# Patient Record
Sex: Male | Born: 1940 | Race: Black or African American | Hispanic: No | Marital: Married | Smoking: Former smoker
Health system: Southern US, Community
[De-identification: ages and names within clinical notes are randomized; demographics above are authoritative.]

## PROBLEM LIST (undated history)

## (undated) DIAGNOSIS — I639 Cerebral infarction, unspecified: Secondary | ICD-10-CM

## (undated) DIAGNOSIS — Z992 Dependence on renal dialysis: Secondary | ICD-10-CM

## (undated) DIAGNOSIS — I10 Essential (primary) hypertension: Secondary | ICD-10-CM

## (undated) DIAGNOSIS — E0590 Thyrotoxicosis, unspecified without thyrotoxic crisis or storm: Secondary | ICD-10-CM

## (undated) DIAGNOSIS — N186 End stage renal disease: Secondary | ICD-10-CM

## (undated) DIAGNOSIS — T82868A Thrombosis of vascular prosthetic devices, implants and grafts, initial encounter: Secondary | ICD-10-CM

## (undated) DIAGNOSIS — F329 Major depressive disorder, single episode, unspecified: Secondary | ICD-10-CM

## (undated) DIAGNOSIS — M1990 Unspecified osteoarthritis, unspecified site: Secondary | ICD-10-CM

## (undated) DIAGNOSIS — T81328A Disruption or dehiscence of closure of other specified internal operation (surgical) wound, initial encounter: Secondary | ICD-10-CM

## (undated) DIAGNOSIS — E7800 Pure hypercholesterolemia, unspecified: Secondary | ICD-10-CM

## (undated) DIAGNOSIS — I96 Gangrene, not elsewhere classified: Secondary | ICD-10-CM

## (undated) DIAGNOSIS — I2510 Atherosclerotic heart disease of native coronary artery without angina pectoris: Secondary | ICD-10-CM

## (undated) DIAGNOSIS — K259 Gastric ulcer, unspecified as acute or chronic, without hemorrhage or perforation: Secondary | ICD-10-CM

## (undated) DIAGNOSIS — T8132XA Disruption of internal operation (surgical) wound, not elsewhere classified, initial encounter: Secondary | ICD-10-CM

## (undated) DIAGNOSIS — F32A Depression, unspecified: Secondary | ICD-10-CM

## (undated) DIAGNOSIS — M51369 Other intervertebral disc degeneration, lumbar region without mention of lumbar back pain or lower extremity pain: Secondary | ICD-10-CM

## (undated) DIAGNOSIS — M5136 Other intervertebral disc degeneration, lumbar region: Secondary | ICD-10-CM

## (undated) DIAGNOSIS — M5030 Other cervical disc degeneration, unspecified cervical region: Secondary | ICD-10-CM

## (undated) DIAGNOSIS — J45909 Unspecified asthma, uncomplicated: Secondary | ICD-10-CM

## (undated) HISTORY — PX: CHOLECYSTECTOMY: SHX55

## (undated) HISTORY — PX: BELOW KNEE LEG AMPUTATION: SUR23

## (undated) HISTORY — PX: APPENDECTOMY: SHX54

## (undated) HISTORY — DX: Gangrene, not elsewhere classified: I96

---

## 1994-02-18 HISTORY — PX: CORONARY ARTERY BYPASS GRAFT: SHX141

## 1997-06-23 ENCOUNTER — Other Ambulatory Visit: Admission: RE | Admit: 1997-06-23 | Discharge: 1997-06-23 | Payer: Self-pay | Admitting: Internal Medicine

## 1998-02-07 ENCOUNTER — Ambulatory Visit (HOSPITAL_COMMUNITY): Admission: RE | Admit: 1998-02-07 | Discharge: 1998-02-07 | Payer: Self-pay | Admitting: Internal Medicine

## 1998-02-07 ENCOUNTER — Encounter: Payer: Self-pay | Admitting: Internal Medicine

## 1998-02-18 DIAGNOSIS — I2510 Atherosclerotic heart disease of native coronary artery without angina pectoris: Secondary | ICD-10-CM

## 1998-02-18 HISTORY — DX: Atherosclerotic heart disease of native coronary artery without angina pectoris: I25.10

## 1998-06-13 ENCOUNTER — Encounter: Payer: Self-pay | Admitting: Cardiology

## 1998-06-13 ENCOUNTER — Inpatient Hospital Stay (HOSPITAL_COMMUNITY): Admission: EM | Admit: 1998-06-13 | Discharge: 1998-06-15 | Payer: Self-pay | Admitting: Emergency Medicine

## 1999-03-08 ENCOUNTER — Encounter: Payer: Self-pay | Admitting: Internal Medicine

## 1999-03-08 ENCOUNTER — Ambulatory Visit (HOSPITAL_COMMUNITY): Admission: RE | Admit: 1999-03-08 | Discharge: 1999-03-08 | Payer: Self-pay | Admitting: Internal Medicine

## 1999-11-19 ENCOUNTER — Inpatient Hospital Stay (HOSPITAL_COMMUNITY): Admission: EM | Admit: 1999-11-19 | Discharge: 1999-11-22 | Payer: Self-pay | Admitting: Emergency Medicine

## 1999-11-19 ENCOUNTER — Encounter: Payer: Self-pay | Admitting: Emergency Medicine

## 1999-11-20 ENCOUNTER — Encounter: Payer: Self-pay | Admitting: Emergency Medicine

## 1999-12-11 ENCOUNTER — Inpatient Hospital Stay (HOSPITAL_COMMUNITY): Admission: EM | Admit: 1999-12-11 | Discharge: 1999-12-12 | Payer: Self-pay | Admitting: Emergency Medicine

## 1999-12-11 ENCOUNTER — Encounter: Payer: Self-pay | Admitting: *Deleted

## 2001-01-09 ENCOUNTER — Encounter: Payer: Self-pay | Admitting: Cardiology

## 2001-01-09 ENCOUNTER — Ambulatory Visit (HOSPITAL_COMMUNITY): Admission: RE | Admit: 2001-01-09 | Discharge: 2001-01-10 | Payer: Self-pay | Admitting: Rehabilitation

## 2002-10-21 ENCOUNTER — Encounter: Payer: Self-pay | Admitting: Orthopedic Surgery

## 2002-10-21 ENCOUNTER — Inpatient Hospital Stay (HOSPITAL_COMMUNITY): Admission: AD | Admit: 2002-10-21 | Discharge: 2002-11-01 | Payer: Self-pay | Admitting: Orthopedic Surgery

## 2002-10-22 ENCOUNTER — Encounter: Payer: Self-pay | Admitting: Orthopedic Surgery

## 2002-11-23 ENCOUNTER — Inpatient Hospital Stay (HOSPITAL_COMMUNITY): Admission: RE | Admit: 2002-11-23 | Discharge: 2002-11-26 | Payer: Self-pay | Admitting: Orthopedic Surgery

## 2002-12-29 ENCOUNTER — Inpatient Hospital Stay (HOSPITAL_COMMUNITY): Admission: RE | Admit: 2002-12-29 | Discharge: 2002-12-31 | Payer: Self-pay | Admitting: Orthopedic Surgery

## 2004-08-31 ENCOUNTER — Ambulatory Visit (HOSPITAL_COMMUNITY): Admission: RE | Admit: 2004-08-31 | Discharge: 2004-08-31 | Payer: Self-pay | Admitting: Internal Medicine

## 2005-03-26 ENCOUNTER — Ambulatory Visit (HOSPITAL_COMMUNITY): Admission: RE | Admit: 2005-03-26 | Discharge: 2005-03-26 | Payer: Self-pay | Admitting: Internal Medicine

## 2005-07-01 ENCOUNTER — Ambulatory Visit (HOSPITAL_COMMUNITY): Admission: RE | Admit: 2005-07-01 | Discharge: 2005-07-01 | Payer: Self-pay | Admitting: Internal Medicine

## 2005-07-03 ENCOUNTER — Ambulatory Visit: Admission: RE | Admit: 2005-07-03 | Discharge: 2005-07-03 | Payer: Self-pay | Admitting: Internal Medicine

## 2010-07-20 ENCOUNTER — Encounter (HOSPITAL_COMMUNITY): Payer: Self-pay | Admitting: Radiology

## 2010-07-20 ENCOUNTER — Emergency Department (HOSPITAL_COMMUNITY): Payer: Medicare Other

## 2010-07-20 ENCOUNTER — Observation Stay (HOSPITAL_COMMUNITY)
Admission: EM | Admit: 2010-07-20 | Discharge: 2010-07-21 | DRG: 552 | Disposition: A | Discharge status: Home / Self Care | Payer: Medicare Other | Attending: Internal Medicine | Admitting: Internal Medicine

## 2010-07-20 DIAGNOSIS — M47812 Spondylosis without myelopathy or radiculopathy, cervical region: Principal | ICD-10-CM | POA: Diagnosis present

## 2010-07-20 DIAGNOSIS — N289 Disorder of kidney and ureter, unspecified: Secondary | ICD-10-CM | POA: Diagnosis present

## 2010-07-20 DIAGNOSIS — I129 Hypertensive chronic kidney disease with stage 1 through stage 4 chronic kidney disease, or unspecified chronic kidney disease: Secondary | ICD-10-CM | POA: Diagnosis present

## 2010-07-20 DIAGNOSIS — M6740 Ganglion, unspecified site: Secondary | ICD-10-CM | POA: Diagnosis present

## 2010-07-20 DIAGNOSIS — R0789 Other chest pain: Secondary | ICD-10-CM | POA: Diagnosis present

## 2010-07-20 DIAGNOSIS — R911 Solitary pulmonary nodule: Secondary | ICD-10-CM | POA: Diagnosis present

## 2010-07-20 DIAGNOSIS — D649 Anemia, unspecified: Secondary | ICD-10-CM | POA: Diagnosis present

## 2010-07-20 DIAGNOSIS — S88119A Complete traumatic amputation at level between knee and ankle, unspecified lower leg, initial encounter: Secondary | ICD-10-CM

## 2010-07-20 DIAGNOSIS — I2510 Atherosclerotic heart disease of native coronary artery without angina pectoris: Secondary | ICD-10-CM | POA: Diagnosis present

## 2010-07-20 DIAGNOSIS — E119 Type 2 diabetes mellitus without complications: Secondary | ICD-10-CM | POA: Diagnosis present

## 2010-07-20 DIAGNOSIS — Z79899 Other long term (current) drug therapy: Secondary | ICD-10-CM | POA: Insufficient documentation

## 2010-07-20 DIAGNOSIS — N183 Chronic kidney disease, stage 3 unspecified: Secondary | ICD-10-CM | POA: Diagnosis present

## 2010-07-20 HISTORY — DX: Essential (primary) hypertension: I10

## 2010-07-20 LAB — CBC
Platelets: 254 10*3/uL (ref 150–400)
RDW: 13.8 % (ref 11.5–15.5)
WBC: 9.5 10*3/uL (ref 4.0–10.5)

## 2010-07-20 LAB — DIFFERENTIAL
Eosinophils Absolute: 0.4 10*3/uL (ref 0.0–0.7)
Lymphocytes Relative: 31 % (ref 12–46)
Lymphs Abs: 3 10*3/uL (ref 0.7–4.0)
Monocytes Absolute: 0.6 10*3/uL (ref 0.1–1.0)
Monocytes Relative: 6 % (ref 3–12)
Neutro Abs: 5.6 10*3/uL (ref 1.7–7.7)

## 2010-07-20 LAB — COMPREHENSIVE METABOLIC PANEL
Albumin: 2.6 g/dL — ABNORMAL LOW (ref 3.5–5.2)
Alkaline Phosphatase: 101 U/L (ref 39–117)
Chloride: 103 mEq/L (ref 96–112)
Glucose, Bld: 95 mg/dL (ref 70–99)
Potassium: 4.7 mEq/L (ref 3.5–5.1)
Total Bilirubin: 0.3 mg/dL (ref 0.3–1.2)
Total Protein: 7.3 g/dL (ref 6.0–8.3)

## 2010-07-20 LAB — CK TOTAL AND CKMB (NOT AT ARMC)
CK, MB: 3.7 ng/mL (ref 0.3–4.0)
Total CK: 122 U/L (ref 7–232)

## 2010-07-20 LAB — D-DIMER, QUANTITATIVE: D-Dimer, Quant: 2.07 ug/mL-FEU — ABNORMAL HIGH (ref 0.00–0.48)

## 2010-07-20 MED ORDER — IOHEXOL 350 MG/ML SOLN
64.0000 mL | Freq: Once | INTRAVENOUS | Status: AC | PRN
  Administered 2010-07-20: 64 mL via INTRAVENOUS

## 2010-07-21 ENCOUNTER — Inpatient Hospital Stay (HOSPITAL_COMMUNITY): Payer: Medicare Other

## 2010-07-21 LAB — GLUCOSE, CAPILLARY
Glucose-Capillary: 117 mg/dL — ABNORMAL HIGH (ref 70–99)
Glucose-Capillary: 215 mg/dL — ABNORMAL HIGH (ref 70–99)

## 2010-07-21 LAB — URIC ACID: Uric Acid, Serum: 7.3 mg/dL (ref 4.0–7.8)

## 2010-07-21 LAB — BASIC METABOLIC PANEL
Chloride: 107 mEq/L (ref 96–112)
GFR calc non Af Amer: 39 mL/min — ABNORMAL LOW (ref >60)
Potassium: 4.6 mEq/L (ref 3.5–5.1)

## 2010-07-21 LAB — SEDIMENTATION RATE: Sed Rate: 100 mm/hr — ABNORMAL HIGH (ref 0–16)

## 2010-07-21 LAB — CARDIAC PANEL(CRET KIN+CKTOT+MB+TROPI)
CK, MB: 3.4 ng/mL (ref 0.3–4.0)
Relative Index: 3.5 — ABNORMAL HIGH (ref 0.0–2.5)
Total CK: 102 U/L (ref 7–232)
Total CK: 93 U/L (ref 7–232)
Troponin I: <0.3 ng/mL (ref <0.30)
Troponin I: <0.3 ng/mL (ref <0.30)

## 2010-07-21 LAB — HEMOGLOBIN A1C: Hgb A1c MFr Bld: 7.7 % — ABNORMAL HIGH (ref <5.7)

## 2010-07-22 NOTE — Discharge Summary (Signed)
NAME:  Alan [name], LINSCHEID NO.:  [id number]  MEDICAL RECORD NO.:  [id number]           PATIENT TYPE:  I  LOCATION:  [id number]                          FACILITY:  [facility]  PHYSICIAN:  Rexene Alberts, M.D.    DATE OF BIRTH:  1940/06/08  DATE OF ADMISSION:  07/20/2010 DATE OF DISCHARGE:  06/02/2012LH                              DISCHARGE SUMMARY   DISCHARGE DIAGNOSES: 1. Chest pain, musculoskeletal in origin.  Myocardial infarction ruled     out.  CT angiogram of the chest was negative for pulmonary     embolism. 2. A 1 cm inferior right upper lobe pulmonary nodule per CT angiogram     of the chest on July 20, 2010.  Followup PET/CT recommended for     further assessment in the outpatient setting. 3. 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  There was also mild     to moderate spinal stenosis at C4-C5 and C5-C6.  There was also a     benign-appearing sclerosis of the right C2 vertebral body. 4. Normocytic anemia; needs further outpatient evaluation. 5. Acute renal insufficiency with a history of probable stage III     chronic kidney disease.  The patient's creatinine was 1.92 on     admission and 1.74 at the time of hospital discharge.  He did     receive IV contrast for the CT angiogram of the chest on July 20, 2010. 6. Left upper extremity edema, likely secondary to ganglion cyst. 7. History of coronary artery disease, stable. 8. Type 2 diabetes mellitus, stable. 9. Hypertension, stable.  DISCHARGE MEDICATIONS: 1. Omeprazole 20 mg daily (new medication). 2. Prednisone with 10 mg tablets.  The patient was instructed to taper     the tablets as prescribed over the next 6 days. 3. Tylenol Arthritis Strength 650 mg 1 tablet every 6 hours as needed     for pain and fever. 4. Aspirin 81 mg daily. 5. Enalapril 2.5 mg nightly to be restarted on Monday June 4. 6. Glyburide 10 mg b.i.d.. 7. Humalog  sliding scale as previously directed before each meal and     at bedtime. 8. Lasix 40 mg half a tablet b.i.d. to be restarted on Monday June 4. 9. Pravastatin 80 mg nightly. 10.Albuterol inhaler 1 puff b.i.d. as needed for wheezing. 11.Stop ranitidine 12.Stop naproxen.  DISCHARGE DISPOSITION:  The patient was discharged to home in improved and stable condition on June 72, 2012.  He Kaydn follow up with his primary care physician Dr. Bridget Hartshorn or colleague in 3 days as previously scheduled.  CONSULTATIONS:  None.  PROCEDURES PERFORMED: 1. X-ray of the cervical spine on June 72, 2012.  The results revealed     severe spondylosis, but no acute findings. 2. CT scan of the cervical spine without contrast on June 72, 2012.     The results revealed very advanced cervical disk degeneration of C4-     C5 through C7-T1.  Multifactorial mild to moderate spinal stenosis  at C4-C5 and C5-C6.  Multifactorial moderate neural foraminal     stenosis at the bilateral C5 and C6 nerve levels.  Benign-appearing     sclerosis of the right C2 vertebral body.  Benign/degenerative     subchondral cyst effects the lower cervical vertebral bodies.  No     acute fracture or listhesis identified in the cervical spine. 3. CT angiogram of the chest on July 20, 2010.  The results revealed no     gross acute aortic abnormality.  No pulmonary embolus. 4. 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  1 cm inferior     right upper lobe pulmonary nodule.  Followup PET/CT recommended to     assess for metabolic activity.  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  Cholecystectomy.  HISTORY OF PRESENT ILLNESS:  The patient is a 70 year old man with a past medical history significant for coronary artery disease, status post CABG with subsequent stenting, diabetes mellitus, and hypertension. He presented to the emergency department on July 20, 2010, with a chief complaint of central chest pain.  In the emergency  department, the patient's EKG revealed normal sinus rhythm with a heart rate of 72 beats per minute and with no significant ST or T-wave abnormalities.  He was hemodynamically stable and afebrile.  His chest x-ray revealed no focal consolidation; apical lordotic projection probably accounts for the opacity of the left lung base in conjunction with chronic scarring and atelectasis.  His first set of cardiac enzymes were within normal limits.  His D-dimer was elevated at 2.07.  This prompted a CT angiogram of his chest.  The CT angiogram was negative for pulmonary embolus.  He was admitted for further evaluation and management.  HOSPITAL COURSE:  The patient was started on nitroglycerin paste.  He was given multiple doses of sublingual nitroglycerin at home and in route by EMS.  It did relieve his pain some, but it did not relieve his pain completely.  It caused a headache.  Cardiac enzymes were ordered for evaluation.  All of his cardiac enzymes were well within normal limits and therefore he ruled out for myocardial infarction.  The nitroglycerin paste was subsequently discontinued.  Of note, the patient was continued on pravastatin.  Ranitidine was discontinued in favor of Protonix for a possible gastroesophageal reflux component.  The patient did have a history of heartburn for which he had been taking ranitidine with modest relief.  His BUN was 31 and his creatinine was 1.92 on admission.  Enalapril and Lasix were withheld temporarily.  He was started on IV fluid hydration with normal saline.  Apparently, the patient received IV contrast for the CT angiogram even though his creatinine was 1.92.  He received at least 2-1/2 L of IV fluids during the hospital course.  His followup BUN improved to 25 and his followup creatinine improved to 1.74.  He acknowledged a history of chronic kidney disease for which he sees a nephrologist in [place], [place]. His baseline creatinine was unknown  but it is likely that he has stage III chronic kidney disease.  For treatment of his diabetes, sliding scale NovoLog was started.  Glyburide was withheld during the hospitalization.  For the most part, his capillary blood glucose ranged from 117 to 215.  He did have 1 asymptomatic episode of hypoglycemia shortly after admission with a venous glucose of 49.  He was treated appropriately.  In my exam of the patient, it was clear that he had bilateral  shoulder discomfort, bilateral upper extremity decrease in range of motion due to discomfort and stiffness, and generalized achiness in his neck.  Each time he raised his right arm, it caused some chest pain.  When he flexed and extended his neck modestly, it also caused some central chest pain. There was some mild tenderness of his pectoralis muscles on the left and the right.  This prompted a CT of his cervical spine.  The results of the cervical spine were dictated above.  It was apparent that he has very advanced cervical disk disease with evidence of mild to moderate spinal stenosis at C4-C5 and C5-C6.  These findings were likely the cause of his chest pain.  This was explained to the patient and his family.  He was given 60 mg of prednisone prior to discharge.  He was discharged on a prednisone taper with instructions.  He was advised to discuss these findings with his primary care physician.  The patient would likely benefit from an outpatient referral to an orthopedic surgeon or neurosurgeon.  He was also informed of the lung nodule that was seen on the CT scan of his chest.  He was instructed to discuss this finding with his primary care physician who can order a PET scan in the near future for followup.  During the followup examination, several hours after he received the prednisone, the patient did express some decrease in the stiffness and pain of his shoulders and neck.  A sed rate was ordered and it was elevated at 100.  His  uric acid was within normal limits at 7.3.  The result of the C-reactive protein was pending at the time of discharge.     Rexene Alberts, M.D.     DF/MEDQ  D:  07/21/2010  T:  07/22/2010  Job:  [id number]  cc:   [facility]  Electronically Signed by Rexene Alberts M.D. on 07/22/2010 06:19:04 PM

## 2010-07-22 NOTE — H&P (Signed)
NAME:  Alan [name], Alan [name] NO.:  [id number]  MEDICAL RECORD NO.:  [id number]           PATIENT TYPE:  I  LOCATION:  [id number]                          FACILITY:  [facility]  PHYSICIAN:  Karlyn Agee, M.D. DATE OF BIRTH:  1941-02-03  DATE OF ADMISSION:  07/20/2010 DATE OF DISCHARGE:  LH                             HISTORY & PHYSICAL   PRIMARY CARE PHYSICIAN:  Unassigned.  CARDIOLOGIST:  [facility], saw Dr. Melvern Banker about 1 year ago.  CHIEF COMPLAINT:  Chest pain since this morning.  HISTORY OF PRESENT ILLNESS:  This is a 70 year old African American gentleman with a history of coronary artery disease status post CABG with subsequent stenting, also history of diabetes and hypertension, status post lower extremity amputation for ischemic disease, who lives at home with his wife and developed a sudden onset of central chest pain at about 8 o'clock the morning of admission.  The pain got progressively worse, at its height, it was about a 10/10.  Took 5 sublingual nitroglycerin which she had at home, but got no relief and eventually, EMS was called.  Ambulance arrived about 12 noon.  He received sublingual nitroglycerin x3 from the EMS and reports that he did get some relief of the pain, but that brought on some headache.  He arrived to the emergency room and had initial cardiac workup done.  No acute evidence of myocardial infarction was found and the Hospitalist Service was called to assist with management.  The patient denied any shortness of breath, dizziness, or diaphoresis- associated pain, but said he did have a feeling of having chills.  The pain is aggravated by deep breathing and movement, black stool, but he also notes that he has been having chills.  PAST MEDICAL HISTORY: 1. Coronary artery disease status post CABG at [facility] in 1996. 2. Status post cardiac stenting 4 years later. 3. Hypertension. 4. Diabetes. 5. History of stroke with  left-sided weakness, now completely     resolved. 6. History of left BKA in 2004, for a nonhealing ulcer of the left     foot. 7. Chronic kidney disease, seeing a doctor in [place]. 8. History of asthma.  Past surgical history includes CABG and left BKA as noted above, also appendectomy after a ruptured appendix.  He is status post cholecystectomy.  MEDICATIONS: 1. Glyburide 10 mg twice daily. 2. Humalog by sliding scale. 3. Lasix 40 mg twice daily. 4. Enalapril 2.5 mg daily. 5. Zantac 300 mg daily. 6. Pravastatin 80 mg at bedtime. 7. Naproxen 500 mg twice daily. 8. An inhaler for wheezing twice daily. 9. Aspirin 81 mg a day.  ALLERGIES:  CODEINE causes itching.  SOCIAL HISTORY:  Denies tobacco, alcohol, or illicit drug use.  Used to work in [employer] fund[employer] in [employer], now long retired.  FAMILY HISTORY:  Significant only for a sister with diabetes. Otherwise, he denies any history of family medical problems.  REVIEW OF SYSTEMS:  Significant for urge incontinence.  He has an artificial limb and ambulates with the assistance of a walker.  Other than this, a complete review of systems is unremarkable.  PHYSICAL EXAMINATION:  GENERAL:  Pleasant, elderly, obese African American gentleman, sitting up in the stretcher, does not appear acutely distressed. VITAL SIGNS:  Temperature is 98, pulse 80, respirations 20, blood pressure 128/88, he is saturating 100% on 2 L. HEENT:  Pupils are round and equal.  Mucous membranes pink.  Anicteric. NECK:  No cervical lymphadenopathy or thyromegaly.  No carotid bruit. CHEST:  He has a broad scar over the sternum.  He is tender to palpation over the sternum.  He has gynecomastia of the left breast.  His chest is clear to auscultation bilaterally. CARDIOVASCULAR SYSTEM:  Regular rhythm.  No murmur heard. ABDOMEN:  Obese, soft, nontender. EXTREMITIES:  He has a left BKA with a well-healed scar.  He has no edema.  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  His right hand has significant wasting of the small muscles of the hand, in particular the muscles between the right thumb and index finger are completely wasted, he still reports, secondary to local trauma. CENTRAL NERVOUS SYSTEM:  Cranial nerves II through XII are grossly intact.  He has no focal lateralizing signs.  LABORATORY DATA:  His white count is 9.5, hemoglobin 10.9, platelets 254.  His sodium is 138, potassium 4.7, chloride 103, CO2 of 29, glucose 95, BUN 31, creatinine 1.92.  His albumin is 2.6, calcium 9.8.  His D- dimer is elevated at 2.0.  His total CK is 122, CK-MB 3.7.  His troponin is undetectable.  Despite his elevated creatinine, a CT angiogram of the chest was done which showed no evidence of pulmonary embolus.  CABG was noted with remote findings of sternal dehiscence, a 1-cm right upper lobe pulmonary nodule was noted, neoplasm is not excluded, old granulomatous disease of the liver.  ASSESSMENT: 1. Atypical chest pain, likely musculoskeletal, but reports some     improvements with nitroglycerin. 2. Coronary artery disease. 3. Chronic kidney disease, baseline unknown. 4. Hypertension. 5. Diabetes type 2. 6. Remote history of peptic ulcer disease.  PLAN: 1. We Bram bring this gentleman on observation. 2. We Audrey hydrate him cautiously to see if we can get an improvement     in his renal function.  Also, we are concerned that he has received     contrast. 3. We Kushal discontinue his naproxen in view of his history of chronic     kidney disease. 4. We Siraj give him Nitropaste to the anterior chest wall since he     does report improvement with Nitropaste, but we Ulys not give any     other active treatment for coronary artery disease. 5. We Purcell monitor him on telemetry. 6. We Jefte discontinue his glipizide, Lasix, and enalapril for the     time being while we hydrate him.  We Dornell give high-dose Protonix      in view of his past history of peptic ulcer disease. 7. Other plans as per orders.     Karlyn Agee, M.D.     LC/MEDQ  D:  07/21/2010  T:  07/21/2010  Job:  [id number]  cc:   [facility]  Electronically Signed by Karlyn Agee M.D. on 07/22/2010 05:38:53 AM

## 2011-01-15 ENCOUNTER — Emergency Department (HOSPITAL_COMMUNITY): Payer: Medicare Other

## 2011-01-15 ENCOUNTER — Emergency Department (HOSPITAL_COMMUNITY)
Admission: EM | Admit: 2011-01-15 | Discharge: 2011-01-15 | Disposition: A | Discharge status: Home / Self Care | Payer: Medicare Other | Attending: Emergency Medicine | Admitting: Emergency Medicine

## 2011-01-15 ENCOUNTER — Encounter (HOSPITAL_COMMUNITY): Payer: Self-pay | Admitting: *Deleted

## 2011-01-15 DIAGNOSIS — E119 Type 2 diabetes mellitus without complications: Secondary | ICD-10-CM | POA: Insufficient documentation

## 2011-01-15 DIAGNOSIS — M109 Gout, unspecified: Secondary | ICD-10-CM | POA: Insufficient documentation

## 2011-01-15 DIAGNOSIS — Z951 Presence of aortocoronary bypass graft: Secondary | ICD-10-CM | POA: Insufficient documentation

## 2011-01-15 DIAGNOSIS — S88119A Complete traumatic amputation at level between knee and ankle, unspecified lower leg, initial encounter: Secondary | ICD-10-CM | POA: Insufficient documentation

## 2011-01-15 DIAGNOSIS — I10 Essential (primary) hypertension: Secondary | ICD-10-CM | POA: Insufficient documentation

## 2011-01-15 LAB — GLUCOSE, CAPILLARY: Glucose-Capillary: 153 mg/dL — ABNORMAL HIGH (ref 70–99)

## 2011-01-15 MED ORDER — COLCHICINE 0.6 MG PO TABS: ORAL_TABLET | ORAL | Status: DC

## 2011-01-15 NOTE — ED Notes (Signed)
Pt states intermittent swelling, redness, and pain to left hand.  Sent from [facility]. Also has knot to back of neck and left wrist area. Pt states knots have began to get larger over past year.

## 2011-01-15 NOTE — ED Provider Notes (Signed)
History     CSN: [id number] Arrival date & time: 01/15/2011  8:17 AM   First MD Initiated Contact with Patient 01/15/11 0818      Chief Complaint  Patient presents with  . Hand Pain   HPI Patient has been having trouble with pain in his hand for the last 6 months. He's been diagnosed with gout in the past and has been given prescriptions for steroids which have caused the swelling to resolve. Patient also has been having cysts in the back of his neck and his wrist. Those have been gradually getting larger over the past year. Patient denies any fevers or chills. He has not had any recent injuries. The swelling is left hand is primarily in his first and second finger and also in the palm of his hand. Patient states he has seen Dr. Luna Glasgow in town and then was referred to an orthopedic doctor in [place]. Family states he was told it was gout. Patient was sent from [facility] to the emergency room today to evaluate this further. The pain increases with movement and palpation. It is mild to moderate in nature. Past Medical History  Diagnosis Date  . Diabetes mellitus   . Hypertension   . Gout     Past Surgical History  Procedure Date  . Coronary artery bypass graft   . Below knee leg amputation     No family history on file.  History  Substance Use Topics  . Smoking status: Never Smoker   . Smokeless tobacco: Not on file  . Alcohol Use: No      Review of Systems  All other systems reviewed and are negative.    Allergies  Codeine  Home Medications  No current outpatient prescriptions on file.  BP 123/69  Pulse 70  Temp(Src) 97.8 F (36.6 C) (Oral)  Resp 16  SpO2 100%  Physical Exam  Nursing note and vitals reviewed. Constitutional: He appears well-developed and well-nourished. No distress.  HENT:  Head: Normocephalic and atraumatic.  Right Ear: External ear normal.  Left Ear: External ear normal.  Eyes: Conjunctivae are normal. Right eye  exhibits no discharge. Left eye exhibits no discharge. No scleral icterus.  Neck: Neck supple. No tracheal deviation present.  Cardiovascular: Normal rate, regular rhythm and intact distal pulses.   Pulmonary/Chest: Effort normal and breath sounds normal. No stridor. No respiratory distress. He has no wheezes. He has no rales.  Abdominal: He exhibits no distension.  Musculoskeletal:       Benign mobile rubbery cyst in the cervical spine region posteriorly, no tenderness to palpation, large cystic rubbery lesion left molar region or wrist, left hand with edema of the first and second digit, mild erythema, no warmth, no significant tenderness, range of motion of second digit limited with some flexion contracture, distal cap refill intact  Neurological: He is alert. He has normal strength. No sensory deficit. Cranial nerve deficit:  no gross defecits noted. He exhibits normal muscle tone. He displays no seizure activity. Coordination normal.  Skin: Skin is warm and dry. No rash noted.  Psychiatric: He has a normal mood and affect.    ED Course  Procedures (including critical care time)  Labs Reviewed  GLUCOSE, CAPILLARY - Abnormal; Notable for the following:    Glucose-Capillary 153 (*)    All other components within normal limits  POCT CBG MONITORING   Dg Hand Complete Left  01/15/2011  *RADIOLOGY REPORT*  Clinical Data: Pain, swelling.  LEFT HAND - COMPLETE  3+ VIEW  Comparison: None.  Findings: Advanced degenerative joint disease changes in the first carpal metacarpal joint with subluxation.  Remainder of joint spaces are maintained. No acute bony abnormality.  Specifically, no fracture, subluxation, or dislocation.  Soft tissues are intact.  IMPRESSION: No acute bony abnormality.  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  Original Report Authenticated By: Raelyn Number, M.D.      MDM  The patient has advanced degenerative changes in his first metacarpal  phalangeal joint. Previously he has been diagnosed with gout. His physical exam suggest a rather extensive involvement. Patient has been on steroids recently without success. I Keontre try him on a course of colchicine. I recommended he followup with his primary care doctor or consider seeing a rheumatologist for further evaluation. Regarding the cysts lesions they appear benign and they can be electively removed as needed. His exam does not suggest any signs of acute infection of the hand toward joint space especially considering the 6 month duration of this illness.        Kathalene Frames, MD 01/15/11 [phone]

## 2011-10-28 ENCOUNTER — Emergency Department (HOSPITAL_COMMUNITY): Payer: Medicare Other

## 2011-10-28 ENCOUNTER — Emergency Department (HOSPITAL_COMMUNITY)
Admission: EM | Admit: 2011-10-28 | Discharge: 2011-10-28 | Disposition: A | Discharge status: Home / Self Care | Payer: Medicare Other | Attending: Emergency Medicine | Admitting: Emergency Medicine

## 2011-10-28 ENCOUNTER — Encounter (HOSPITAL_COMMUNITY): Payer: Self-pay | Admitting: Emergency Medicine

## 2011-10-28 DIAGNOSIS — Z79899 Other long term (current) drug therapy: Secondary | ICD-10-CM | POA: Insufficient documentation

## 2011-10-28 DIAGNOSIS — S88119A Complete traumatic amputation at level between knee and ankle, unspecified lower leg, initial encounter: Secondary | ICD-10-CM | POA: Insufficient documentation

## 2011-10-28 DIAGNOSIS — Z951 Presence of aortocoronary bypass graft: Secondary | ICD-10-CM | POA: Insufficient documentation

## 2011-10-28 DIAGNOSIS — R109 Unspecified abdominal pain: Secondary | ICD-10-CM | POA: Insufficient documentation

## 2011-10-28 DIAGNOSIS — M549 Dorsalgia, unspecified: Secondary | ICD-10-CM | POA: Insufficient documentation

## 2011-10-28 DIAGNOSIS — I10 Essential (primary) hypertension: Secondary | ICD-10-CM | POA: Insufficient documentation

## 2011-10-28 DIAGNOSIS — E119 Type 2 diabetes mellitus without complications: Secondary | ICD-10-CM | POA: Insufficient documentation

## 2011-10-28 LAB — CBC WITH DIFFERENTIAL/PLATELET
Basophils Absolute: 0 10*3/uL (ref 0.0–0.1)
Basophils Relative: 0 % (ref 0–1)
MCHC: 34 g/dL (ref 30.0–36.0)
Neutro Abs: 8.6 10*3/uL — ABNORMAL HIGH (ref 1.7–7.7)
Neutrophils Relative %: 71 % (ref 43–77)
RDW: 13.5 % (ref 11.5–15.5)

## 2011-10-28 LAB — COMPREHENSIVE METABOLIC PANEL
AST: 18 U/L (ref 0–37)
Albumin: 3.1 g/dL — ABNORMAL LOW (ref 3.5–5.2)
Alkaline Phosphatase: 83 U/L (ref 39–117)
Chloride: 97 mEq/L (ref 96–112)
Creatinine, Ser: 1.48 mg/dL — ABNORMAL HIGH (ref 0.50–1.35)
Potassium: 3.8 mEq/L (ref 3.5–5.1)
Total Bilirubin: 0.5 mg/dL (ref 0.3–1.2)

## 2011-10-28 LAB — URINALYSIS, ROUTINE W REFLEX MICROSCOPIC
Leukocytes, UA: NEGATIVE
Nitrite: NEGATIVE
Protein, ur: NEGATIVE mg/dL

## 2011-10-28 MED ORDER — TRAMADOL HCL 50 MG PO TABS: 50.0000 mg | ORAL_TABLET | Freq: Four times a day (QID) | ORAL | Status: DC | PRN

## 2011-10-28 MED ORDER — SODIUM CHLORIDE 0.9 % IV BOLUS (SEPSIS)
1000.0000 mL | Freq: Once | INTRAVENOUS | Status: AC
  Administered 2011-10-28: 1000 mL via INTRAVENOUS

## 2011-10-28 MED ORDER — ONDANSETRON HCL 4 MG/2ML IJ SOLN
4.0000 mg | Freq: Once | INTRAMUSCULAR | Status: AC
  Administered 2011-10-28: 4 mg via INTRAVENOUS
  Filled 2011-10-28: qty 2

## 2011-10-28 MED ORDER — HYDROMORPHONE HCL PF 1 MG/ML IJ SOLN
1.0000 mg | Freq: Once | INTRAMUSCULAR | Status: AC
  Administered 2011-10-28: 1 mg via INTRAVENOUS
  Filled 2011-10-28: qty 1

## 2011-10-28 NOTE — ED Notes (Signed)
Patient with no complaints at this time. Respirations even and unlabored. Skin warm/dry. Discharge instructions reviewed with patient at this time. Patient given opportunity to voice concerns/ask questions. IV removed per policy and band-aid applied to site. Patient discharged at this time and left Emergency Department with steady gait.  

## 2011-10-28 NOTE — ED Notes (Signed)
Right sided flank pain starting 3 days ago.  Urgency and burning with urination.

## 2011-10-28 NOTE — ED Provider Notes (Signed)
History   This chart was scribed for Alieyah Spader B. Karle Starch, MD by Hampton Abbot. This patient was seen in room APA05/APA05 and the patient's care was started at 2:44PM.   CSN: [id number]  Arrival date & time 10/28/11  1033   First MD Initiated Contact with Patient 10/28/11 1439      Chief Complaint  Patient presents with  . Flank Pain    Right    (Consider location/radiation/quality/duration/timing/severity/associated sxs/prior treatment) Patient is a 71 y.o. male presenting with flank pain. The history is provided by the patient. No language interpreter was used.  Flank Pain  Pt reports 3 days of moderate to severe aching R flank pain, non radiating, worse with some movements, not improved with home pain medications. He reports 3 episodes of non-bloody, non-bilious emesis over past two days but denies any episodes today.  Pt denies rash and hematuria as associated symptoms.  Pt has h/o HTN and DM.  Pt denies tobacco and alcohol use. No falls. No abdominal pain.   Past Medical History  Diagnosis Date  . Diabetes mellitus   . Hypertension   . Gout     Past Surgical History  Procedure Date  . Coronary artery bypass graft   . Below knee leg amputation     History reviewed. No pertinent family history.  History  Substance Use Topics  . Smoking status: Never Smoker   . Smokeless tobacco: Not on file  . Alcohol Use: No      Review of Systems  Genitourinary: Positive for flank pain.  All other systems reviewed and are negative.    Allergies  Codeine  Home Medications   Current Outpatient Rx  Name Route Sig Dispense Refill  . ACETAMINOPHEN ER 650 MG PO TBCR Oral Take 650 mg by mouth 3 (three) times daily.      . COLCHICINE 0.6 MG PO TABS  Take 2 tablets at once and then one additional tablet one hour later if needed. You may repeat in 24 hours 12 tablet 0  . ENALAPRIL MALEATE 2.5 MG PO TABS Oral Take 2.5 mg by mouth daily.      . FUROSEMIDE 40 MG PO TABS Oral Take 20 mg  by mouth 2 (two) times daily.      Marland Kitchen GLIPIZIDE 10 MG PO TABS Oral Take 10 mg by mouth 2 (two) times daily.      . INSULIN ISOPHANE HUMAN 100 UNIT/ML [place] SUSP Subcutaneous Inject 3-30 Units into the skin 2 (two) times daily as needed. Use according to sliding scale.     Marland Kitchen METOPROLOL SUCCINATE ER 25 MG PO TB24 Oral Take 25 mg by mouth.      Creed Copper M PLUS PO TABS Oral Take 1 tablet by mouth daily.      Marland Kitchen PRAVASTATIN SODIUM 80 MG PO TABS Oral Take 80 mg by mouth daily.        BP 125/72  Pulse 79  Temp 98.2 F (36.8 C) (Oral)  Resp 16  Ht 5\' 9"  (1.753 m)  SpO2 100%  Physical Exam  Nursing note and vitals reviewed. Constitutional: He is oriented to person, place, and time. He appears well-developed and well-nourished.  HENT:  Head: Normocephalic and atraumatic.  Eyes: EOM are normal. Pupils are equal, round, and reactive to light.  Neck: Normal range of motion. Neck supple.  Cardiovascular: Normal rate, normal heart sounds and intact distal pulses.   Pulmonary/Chest: Effort normal and breath sounds normal.  Abdominal: Bowel sounds are normal. He  exhibits no distension. There is no tenderness.  Musculoskeletal: Normal range of motion. He exhibits tenderness. He exhibits no edema.       Some tenderness to palpation and light touch on R flank  Neurological: He is alert and oriented to person, place, and time. He has normal strength. No cranial nerve deficit or sensory deficit.  Skin: Skin is warm and dry. No rash (specifically no shingles rash) noted.  Psychiatric: He has a normal mood and affect.    ED Course  Procedures (including critical care time) DIAGNOSTIC STUDIES: Oxygen Saturation is 100% on room air, normal by my interpretation.    COORDINATION OF CARE: 2:45PM- Ordered IV fluids, nausea meds, and pain meds.     Labs Reviewed  URINALYSIS, ROUTINE W REFLEX MICROSCOPIC - Abnormal; Notable for the following:    Ketones, ur TRACE (*)     All other components within normal  limits  CBC WITH DIFFERENTIAL - Abnormal; Notable for the following:    WBC 12.2 (*)     Neutro Abs 8.6 (*)     All other components within normal limits  COMPREHENSIVE METABOLIC PANEL - Abnormal; Notable for the following:    Glucose, Bld 139 (*)     Creatinine, Ser 1.48 (*)     Albumin 3.1 (*)     GFR calc non Af Amer 46 (*)     GFR calc Af Amer 53 (*)     All other components within normal limits   Ct Abdomen Pelvis Wo Contrast  10/28/2011  *RADIOLOGY REPORT*  Clinical Data: Right flank pain for 2-3 days, vomiting  CT ABDOMEN AND PELVIS WITHOUT CONTRAST  Technique:  Multidetector CT imaging of the abdomen and pelvis was performed following the standard protocol without intravenous contrast.  Comparison: None.  Findings: Probable linear atelectasis is noted at the left lung base.  The liver is unremarkable in the unenhanced state.  Surgical clips are present from prior cholecystectomy.  There does appear to be an upper midline abdominal hernia containing only fat.  There is diffuse fatty infiltration of the pancreas.  The pancreatic duct is not dilated.  The adrenal glands and spleen are unremarkable.  The stomach is moderately fluid distended with no abnormality noted. No renal calculi are seen.  There is no evidence of hydronephrosis. The abdominal aorta is ectatic but normal in caliber.  No adenopathy is seen.  The urinary bladder is moderately urine distended with no abnormality noted.  No distal ureteral calculus is seen.  There is moderate enlargement of the prostate gland which measures 4.3 x 5.0 cm in its largest axial plane, slightly indenting the posterior inferior urinary bladder.  The colon is largely decompressed but no colonic abnormality is seen.  The terminal ileum is unremarkable. Moderate thoracolumbar scoliosis is noted with diffuse degenerative change and degenerative disc disease throughout the lumbar spine. 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  IMPRESSION:  1.  No acute abnormality on unenhanced CT of the abdomen and pelvis. 2.  Small upper midline abdominal hernia containing fat. 3.  No renal calculi.  No hydronephrosis.  None of for enlarged prostate. 5.  Moderate thoracolumbar scoliosis with diffuse degenerative change.  Degenerative change also involves the right hip with deformity of the right femoral head.   Original Report Authenticated By: Joretta Bachelor, M.D.      No diagnosis found.    MDM  No significant abnormalities in labs  or imaging.   Pain possibly due to back muscle spasm given history of hip arthritis and R leg prosthesis. Consider early shingles as well. Regino d/c with pain medications and PCP follow-up.     I personally performed the services described in the documentation, which were scribed in my presence. The recorded information has been reviewed and considered.     Rossana Molchan B. Karle Starch, MD 10/28/11 1649

## 2011-11-04 ENCOUNTER — Emergency Department (HOSPITAL_COMMUNITY)
Admission: EM | Admit: 2011-11-04 | Discharge: 2011-11-04 | Disposition: A | Discharge status: Home / Self Care | Payer: Medicare Other | Attending: Emergency Medicine | Admitting: Emergency Medicine

## 2011-11-04 ENCOUNTER — Encounter (HOSPITAL_COMMUNITY): Payer: Self-pay | Admitting: *Deleted

## 2011-11-04 DIAGNOSIS — N289 Disorder of kidney and ureter, unspecified: Secondary | ICD-10-CM

## 2011-11-04 DIAGNOSIS — M545 Low back pain, unspecified: Secondary | ICD-10-CM | POA: Insufficient documentation

## 2011-11-04 DIAGNOSIS — I959 Hypotension, unspecified: Secondary | ICD-10-CM

## 2011-11-04 DIAGNOSIS — Z951 Presence of aortocoronary bypass graft: Secondary | ICD-10-CM | POA: Insufficient documentation

## 2011-11-04 DIAGNOSIS — I10 Essential (primary) hypertension: Secondary | ICD-10-CM | POA: Insufficient documentation

## 2011-11-04 DIAGNOSIS — E119 Type 2 diabetes mellitus without complications: Secondary | ICD-10-CM | POA: Insufficient documentation

## 2011-11-04 DIAGNOSIS — M109 Gout, unspecified: Secondary | ICD-10-CM | POA: Insufficient documentation

## 2011-11-04 DIAGNOSIS — Z79899 Other long term (current) drug therapy: Secondary | ICD-10-CM | POA: Insufficient documentation

## 2011-11-04 DIAGNOSIS — S88119A Complete traumatic amputation at level between knee and ankle, unspecified lower leg, initial encounter: Secondary | ICD-10-CM | POA: Insufficient documentation

## 2011-11-04 DIAGNOSIS — M549 Dorsalgia, unspecified: Secondary | ICD-10-CM

## 2011-11-04 DIAGNOSIS — Z794 Long term (current) use of insulin: Secondary | ICD-10-CM | POA: Insufficient documentation

## 2011-11-04 LAB — CBC WITH DIFFERENTIAL/PLATELET
Eosinophils Absolute: 0.1 10*3/uL (ref 0.0–0.7)
Eosinophils Relative: 1 % (ref 0–5)
HCT: 34.8 % — ABNORMAL LOW (ref 39.0–52.0)
Lymphs Abs: 2.7 10*3/uL (ref 0.7–4.0)
MCH: 29 pg (ref 26.0–34.0)
MCV: 86.1 fL (ref 78.0–100.0)
Monocytes Absolute: 1 10*3/uL (ref 0.1–1.0)
Monocytes Relative: 9 % (ref 3–12)
Platelets: 223 10*3/uL (ref 150–400)
RBC: 4.04 MIL/uL — ABNORMAL LOW (ref 4.22–5.81)

## 2011-11-04 LAB — BASIC METABOLIC PANEL
BUN: 31 mg/dL — ABNORMAL HIGH (ref 6–23)
Calcium: 9.4 mg/dL (ref 8.4–10.5)
Creatinine, Ser: 1.91 mg/dL — ABNORMAL HIGH (ref 0.50–1.35)
GFR calc non Af Amer: 33 mL/min — ABNORMAL LOW (ref >90)
Glucose, Bld: 166 mg/dL — ABNORMAL HIGH (ref 70–99)
Sodium: 138 mEq/L (ref 135–145)

## 2011-11-04 LAB — URINALYSIS, ROUTINE W REFLEX MICROSCOPIC
Bilirubin Urine: NEGATIVE
Glucose, UA: 250 mg/dL — AB
Hgb urine dipstick: NEGATIVE
Ketones, ur: NEGATIVE mg/dL
Protein, ur: NEGATIVE mg/dL
Urobilinogen, UA: 0.2 mg/dL (ref 0.0–1.0)

## 2011-11-04 MED ORDER — SODIUM CHLORIDE 0.9 % IV BOLUS (SEPSIS)
500.0000 mL | Freq: Once | INTRAVENOUS | Status: AC
  Administered 2011-11-04: 1000 mL via INTRAVENOUS

## 2011-11-04 MED ORDER — DIAZEPAM 5 MG PO TABS: 5.0000 mg | ORAL_TABLET | Freq: Four times a day (QID) | ORAL | Status: DC | PRN

## 2011-11-04 MED ORDER — OXYCODONE-ACETAMINOPHEN 5-325 MG PO TABS: 1.0000 | ORAL_TABLET | Freq: Four times a day (QID) | ORAL | Status: DC | PRN

## 2011-11-04 NOTE — ED Provider Notes (Signed)
History    This chart was scribed for [employer]. Alvino Chapel, MD, MD by Rhae Lerner. The patient was seen in room APA08 and the patient's care was started at 5:13PM.   CSN: [id number]  Arrival date & time 11/04/11  1615      Chief Complaint  Patient presents with  . Back Pain    (Consider location/radiation/quality/duration/timing/severity/associated sxs/prior treatment) Patient is a 71 y.o. male presenting with back pain. The history is provided by the patient. No language interpreter was used.  Back Pain  This is a recurrent problem. Pertinent negatives include no fever and no weakness.   Alan [name] is a 71 y.o. male who presents to the Emergency Department complaining of moderate right lower back with gradual onset 10 days ago. Pt reports symptoms have gradually worsened. Reports that lying on his left side improves symptoms. Pt reports that his amputated leg has mild pain onset 1 day ago. Pt has been to PCP for current symptoms and given pain medication but he has not had relief after taking medication.   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 Diabetes mellitus   . Hypertension   . Gout     Past Surgical History  Procedure Date  . Coronary artery bypass graft   . Below knee leg amputation     History reviewed. No pertinent family history.  History  Substance Use Topics  . Smoking status: Never Smoker   . Smokeless tobacco: Not on file  . Alcohol Use: No      Review of Systems  Constitutional: Negative for fever and chills.  Respiratory: Negative for shortness of breath.   Gastrointestinal: Positive for nausea and vomiting.  Musculoskeletal: Positive for back pain.  Neurological: Negative for weakness.    Allergies  Codeine and Yellow jacket venom  Home Medications   Current Outpatient Rx  Name Route Sig Dispense Refill  . VITAMIN C PO Oral Take 1 tablet by mouth daily.    . ASPIRIN EC 81 MG PO TBEC Oral Take 81  mg by mouth every morning.    . CO Q 10 PO Oral Take 1 tablet by mouth daily.    . ENALAPRIL MALEATE 5 MG PO TABS Oral Take 5 mg by mouth daily.    . FUROSEMIDE 40 MG PO TABS Oral Take 20 mg by mouth 2 (two) times daily.      Marland Kitchen GLIPIZIDE 10 MG PO TABS Oral Take 10 mg by mouth 2 (two) times daily.      . INSULIN ISOPHANE HUMAN 100 UNIT/ML [place] SUSP Subcutaneous Inject 10 Units into the skin at bedtime. Use according to sliding scale.    Creed Copper M PLUS PO TABS Oral Take 1 tablet by mouth daily.      Marland Kitchen NITROGLYCERIN 0.4 MG SL SUBL Sublingual Place 0.4 mg under the tongue every 5 (five) minutes as needed.    Marland Kitchen PRAVASTATIN SODIUM 80 MG PO TABS Oral Take 80 mg by mouth every evening.     Marland Kitchen TRAMADOL HCL 50 MG PO TABS Oral Take 1 tablet (50 mg total) by mouth every 6 (six) hours as needed for pain. 15 tablet 0    BP 91/52  Pulse 63  Temp 97.8 F (36.6 C) (Oral)  Resp 18  Ht 5\' 9"  (1.753 m)  Wt 190 lb (86.183 kg)  BMI 28.06 kg/m2  SpO2 100%  Physical Exam  Nursing note and vitals reviewed. Constitutional: He is  oriented to person, place, and time. He appears well-developed and well-nourished.  HENT:  Head: Normocephalic and atraumatic.  Cardiovascular: Normal rate, regular rhythm and normal heart sounds.   No murmur heard. Pulmonary/Chest: Effort normal. No respiratory distress. He has no wheezes. He has no rales.  Abdominal: Soft. He exhibits no distension and no mass. There is no tenderness. There is no rebound and no guarding.  Musculoskeletal:       Lt below knee amputation Right lower flank tenderness No midline tenderness No crepitus No deformity Pain with ROM of right foot  Neurological: He is alert and oriented to person, place, and time.  Skin: Skin is warm and dry. No rash noted.  Psychiatric: He has a normal mood and affect. His behavior is normal.    ED Course  Procedures (including critical care time) DIAGNOSTIC STUDIES: Oxygen Saturation is 100% on room air, normal by  my interpretation.    COORDINATION OF CARE: 5:18 PM Discussed pt ED treatment with pt  6:08 PM Recheck: Reported to pt that I am awaiting labs and upon results further treatment course Dvaughn be determined. Pt still has pain.   6:15 PM Ordered:   Medications  sodium chloride 0.9 % bolus 500 mL (1000 mL Intravenous Given 11/04/11 1858)  HYDROcodone-acetaminophen (NORCO/VICODIN) 5-325 MG per tablet (not administered)   Results for orders placed during the hospital encounter of 11/04/11  CBC WITH DIFFERENTIAL      Component Value Range   WBC 11.1 (*) 4.0 - 10.5 K/uL   RBC 4.04 (*) 4.22 - 5.81 MIL/uL   Hemoglobin 11.7 (*) 13.0 - 17.0 g/dL   HCT 34.8 (*) 39.0 - 52.0 %   MCV 86.1  78.0 - 100.0 fL   MCH 29.0  26.0 - 34.0 pg   MCHC 33.6  30.0 - 36.0 g/dL   RDW 13.7  11.5 - 15.5 %   Platelets 223  150 - 400 K/uL   Neutrophils Relative 66  43 - 77 %   Neutro Abs 7.3  1.7 - 7.7 K/uL   Lymphocytes Relative 24  12 - 46 %   Lymphs Abs 2.7  0.7 - 4.0 K/uL   Monocytes Relative 9  3 - 12 %   Monocytes Absolute 1.0  0.1 - 1.0 K/uL   Eosinophils Relative 1  0 - 5 %   Eosinophils Absolute 0.1  0.0 - 0.7 K/uL   Basophils Relative 0  0 - 1 %   Basophils Absolute 0.0  0.0 - 0.1 K/uL  BASIC METABOLIC PANEL      Component Value Range   Sodium 138  135 - 145 mEq/L   Potassium 4.5  3.5 - 5.1 mEq/L   Chloride 101  96 - 112 mEq/L   CO2 27  19 - 32 mEq/L   Glucose, Bld 166 (*) 70 - 99 mg/dL   BUN 31 (*) 6 - 23 mg/dL   Creatinine, Ser 1.91 (*) 0.50 - 1.35 mg/dL   Calcium 9.4  8.4 - 10.5 mg/dL   GFR calc non Af Amer 33 (*) >90 mL/min   GFR calc Af Amer 39 (*) >90 mL/min  URINALYSIS, ROUTINE W REFLEX MICROSCOPIC      Component Value Range   Color, Urine YELLOW  YELLOW   APPearance CLEAR  CLEAR   Specific Gravity, Urine 1.020  1.005 - 1.030   pH 5.0  5.0 - 8.0   Glucose, UA 250 (*) NEGATIVE mg/dL   Hgb urine dipstick NEGATIVE  NEGATIVE  Bilirubin Urine NEGATIVE  NEGATIVE   Ketones, ur NEGATIVE   NEGATIVE mg/dL   Protein, ur NEGATIVE  NEGATIVE mg/dL   Urobilinogen, UA 0.2  0.0 - 1.0 mg/dL   Nitrite NEGATIVE  NEGATIVE   Leukocytes, UA NEGATIVE  NEGATIVE  .   8:46 PM Recheck: Discussed lab results and treatment course with pt. Pt is feeling better.       No results found.   No diagnosis found.    MDM  Patient's had right lower back pain for last 10 days. His been seen by the ER and his PCP. He had a CT done here previously which shows some degenerative disease. No renal problems. White count was mildly elevated at that time. He is Continued pain unrelieved with hydrocodone. Patient's blood pressure was marginal here. Lab work is overall reassuring except for her elevated BUN/creatinine. Patient was given fluid boluses and blood pressure improved. Urinalysis still does not show infection. Patient states that his pain is improved. He'll be started on Percocet and some Valium. Sy followup in 2 days with his primary care doctor for a recheck of his kidney function. He was instructed to return for any lightheadedness dizziness or difficulty urinating, or fevers      I personally performed the services described in this documentation, which was scribed in my presence. The recorded information has been reviewed and considered.     Jasper Riling. Alvino Chapel, MD 11/04/11 2125

## 2011-11-04 NOTE — ED Notes (Signed)
Pt c/o continues to have right flank/back pain, has been seen in er for same a few days ago, states that he is not getting any better.

## 2011-11-04 NOTE — ED Notes (Signed)
Pain rt lower back for 10 days, seen here for same, and by his PCP , but no better.

## 2011-11-15 ENCOUNTER — Emergency Department (INDEPENDENT_AMBULATORY_CARE_PROVIDER_SITE_OTHER)
Admission: EM | Admit: 2011-11-15 | Discharge: 2011-11-15 | Disposition: A | Discharge status: Home / Self Care | Payer: Medicare Other | Source: Home / Self Care

## 2011-11-15 ENCOUNTER — Encounter (HOSPITAL_COMMUNITY): Payer: Self-pay | Admitting: *Deleted

## 2011-11-15 DIAGNOSIS — G8929 Other chronic pain: Secondary | ICD-10-CM

## 2011-11-15 DIAGNOSIS — M549 Dorsalgia, unspecified: Secondary | ICD-10-CM

## 2011-11-15 MED ORDER — OXYCODONE-ACETAMINOPHEN 5-325 MG PO TABS: 1.0000 | ORAL_TABLET | Freq: Four times a day (QID) | ORAL | Status: DC | PRN

## 2011-11-15 NOTE — ED Notes (Signed)
Pt   Reports  Low  Back  Pain  With  The  Pain  radiatng  Down his  r leg  -  Pt  denys  Any  Recent   specefic  Injury  He  Has  Been       Treated  Er sev  Times  Over  Last month  And  Is  Seeing  His  Primary  Care provder  As  Well         The  Pt   denys  Any  Urinary  Symptoms  And                States  Was  Tested  On a   Previous  Visit   For  These  Symptoms       -  He  States  For  The  Most  Part  He  Uses  A  Wheelchair /  Environmental consultant  At home  Due  To  Bad  Arthritis   And      Leg  Amputation           His   Significant  Other is  At the  Bedside

## 2011-11-15 NOTE — ED Provider Notes (Signed)
Medical screening examination/treatment/procedure(s) were performed by non-physician practitioner and as supervising physician I was immediately available for consultation/collaboration.  Philipp Deputy, M.D.   Harden Mo, MD 11/15/11 [phone]

## 2011-11-15 NOTE — ED Provider Notes (Signed)
History     CSN: [id number]  Arrival date & time 11/15/11  1426   None     Chief Complaint  Patient presents with  . 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 This is the third or fourth visit to an outpatient facility for pain control. His diagnosis is right low back pain most likely musculoskeletal in nature. He was initially given hydrocodone for pain however this did not control it. He had been asking for something stronger had been given Percocet his previous emergency department visits. There is no history of trauma he does not know why he has the back pain. The pain is much worse when lying on the right side he can only on the left side for pain relief. He also has pain while in the wheelchair and thus took himself to the left. Pain is constant and made worse by certain positions and movement.  Patient is a 71 y.o. male presenting with back pain. The history is provided by the patient and the spouse.  Back Pain  This is a chronic problem. The problem occurs constantly. The problem has not changed since onset.The pain is moderate. The symptoms are aggravated by bending, twisting and certain positions. The pain is the same all the time. Pertinent negatives include no chest pain, no fever, no numbness, no weight loss, no headaches, no abdominal pain, no abdominal swelling, no bowel incontinence, no perianal numbness, no bladder incontinence, no pelvic pain, no leg pain, no paresthesias and no weakness.    Past Medical History  Diagnosis Date  . Diabetes mellitus   . Hypertension   . Gout     Past Surgical History  Procedure Date  . Coronary artery bypass graft   . Below knee leg amputation     No family history on file.  History  Substance Use Topics  . Smoking status: Never Smoker    . Smokeless tobacco: Not on file  . Alcohol Use: No      Review of Systems  Constitutional: Negative.  Negative for fever and weight loss.  Respiratory: Negative.   Cardiovascular: Negative for chest pain.  Gastrointestinal: Negative.  Negative for abdominal pain and bowel incontinence.  Genitourinary: Negative.  Negative for bladder incontinence and pelvic pain.  Musculoskeletal: Positive for back pain and gait problem.       As per HPI  Skin: Negative.   Neurological: Negative for dizziness, weakness, numbness, headaches and paresthesias.    Allergies  Codeine and Yellow jacket venom  Home Medications   Current Outpatient Rx  Name Route Sig Dispense Refill  . VITAMIN C PO Oral Take 1 tablet by mouth daily.    . ASPIRIN EC 81 MG PO TBEC Oral Take 81 mg by mouth every morning.    . CO Q 10 PO Oral Take 1 tablet by mouth daily.    Marland Kitchen DIAZEPAM 5 MG PO TABS Oral Take 1 tablet (5 mg total) by mouth every 6 (six) hours as needed (spasm). 10 tablet 0  . ENALAPRIL MALEATE 5 MG PO TABS Oral Take 5 mg by mouth daily.    . FUROSEMIDE 40 MG PO TABS Oral Take 20 mg by mouth 2 (two) times daily.      Marland Kitchen GLIPIZIDE 10 MG PO TABS Oral Take 10 mg by mouth 2 (two) times daily.      Marland Kitchen  INSULIN ISOPHANE HUMAN 100 UNIT/ML [place] SUSP Subcutaneous Inject 10 Units into the skin at bedtime. Use according to sliding scale.    Creed Copper M PLUS PO TABS Oral Take 1 tablet by mouth daily.      Marland Kitchen NITROGLYCERIN 0.4 MG SL SUBL Sublingual Place 0.4 mg under the tongue every 5 (five) minutes as needed.    . OXYCODONE-ACETAMINOPHEN 5-325 MG PO TABS Oral Take 1-2 tablets by mouth every 6 (six) hours as needed for pain. 15 tablet 0  . PRAVASTATIN SODIUM 80 MG PO TABS Oral Take 80 mg by mouth every evening.       BP 119/69  Pulse 80  Temp 98.4 F (36.9 C) (Oral)  Resp 16  SpO2 100%  Physical Exam  Constitutional: He is oriented to person, place, and time. He appears well-nourished. No distress.  HENT:  Head:  Normocephalic and atraumatic.  Eyes: EOM are normal. Left eye exhibits no discharge.  Neck: Normal range of motion. Neck supple.  Cardiovascular: Normal rate, regular rhythm and normal heart sounds.   Pulmonary/Chest: Effort normal and breath sounds normal. No respiratory distress.  Musculoskeletal:       There is tenderness along the right iliac creast, gluteus medius and upper aspect of the gluteus Maximus and right para lumbosacral musculature. No rashes seen over the area.  Neurological: He is alert and oriented to person, place, and time. No cranial nerve deficit.  Skin: Skin is warm and dry. No rash noted.  Psychiatric: He has a normal mood and affect.    ED Course  Procedures (including critical care time)  Labs Reviewed - No data to display No results found.   No diagnosis found.    MDM  Percocet 5/325 one by mouth every 6 hours when necessary pain #12. At reviewed the past records of his visits within the [place] . He has had 2 visits to the emergency department as well as 2 visits to his PCP for the same right low back pain. CT scans from the emergency Department visits revealed no acute abnormalities. He does have some degenerative disease in the hip and spine. He is encouraged to follow up with his PCP for pain control, his PCP may want him to go to the pain clinic for her pain management.        Janne Napoleon, NP 11/15/11 [phone]

## 2012-05-19 ENCOUNTER — Emergency Department (HOSPITAL_COMMUNITY): Payer: Medicare Other

## 2012-05-19 ENCOUNTER — Encounter (HOSPITAL_COMMUNITY): Payer: Self-pay

## 2012-05-19 ENCOUNTER — Inpatient Hospital Stay (HOSPITAL_COMMUNITY)
Admission: EM | Admit: 2012-05-19 | Discharge: 2012-05-25 | DRG: 683 | Disposition: A | Discharge status: Home / Self Care | Payer: Medicare Other | Attending: Internal Medicine | Admitting: Internal Medicine

## 2012-05-19 DIAGNOSIS — N2581 Secondary hyperparathyroidism of renal origin: Secondary | ICD-10-CM | POA: Diagnosis present

## 2012-05-19 DIAGNOSIS — N179 Acute kidney failure, unspecified: Principal | ICD-10-CM | POA: Diagnosis present

## 2012-05-19 DIAGNOSIS — R52 Pain, unspecified: Secondary | ICD-10-CM | POA: Diagnosis present

## 2012-05-19 DIAGNOSIS — I120 Hypertensive chronic kidney disease with stage 5 chronic kidney disease or end stage renal disease: Secondary | ICD-10-CM | POA: Diagnosis present

## 2012-05-19 DIAGNOSIS — D649 Anemia, unspecified: Secondary | ICD-10-CM | POA: Diagnosis present

## 2012-05-19 DIAGNOSIS — I739 Peripheral vascular disease, unspecified: Secondary | ICD-10-CM | POA: Diagnosis present

## 2012-05-19 DIAGNOSIS — E1169 Type 2 diabetes mellitus with other specified complication: Secondary | ICD-10-CM | POA: Diagnosis present

## 2012-05-19 DIAGNOSIS — E876 Hypokalemia: Secondary | ICD-10-CM | POA: Diagnosis not present

## 2012-05-19 DIAGNOSIS — Z89512 Acquired absence of left leg below knee: Secondary | ICD-10-CM

## 2012-05-19 DIAGNOSIS — S88119A Complete traumatic amputation at level between knee and ankle, unspecified lower leg, initial encounter: Secondary | ICD-10-CM

## 2012-05-19 DIAGNOSIS — Z7982 Long term (current) use of aspirin: Secondary | ICD-10-CM

## 2012-05-19 DIAGNOSIS — I999 Unspecified disorder of circulatory system: Secondary | ICD-10-CM | POA: Diagnosis present

## 2012-05-19 DIAGNOSIS — Z794 Long term (current) use of insulin: Secondary | ICD-10-CM

## 2012-05-19 DIAGNOSIS — R0789 Other chest pain: Secondary | ICD-10-CM | POA: Diagnosis present

## 2012-05-19 DIAGNOSIS — Z91038 Other insect allergy status: Secondary | ICD-10-CM

## 2012-05-19 DIAGNOSIS — J45909 Unspecified asthma, uncomplicated: Secondary | ICD-10-CM | POA: Diagnosis present

## 2012-05-19 DIAGNOSIS — M5137 Other intervertebral disc degeneration, lumbosacral region: Secondary | ICD-10-CM | POA: Diagnosis present

## 2012-05-19 DIAGNOSIS — M1610 Unilateral primary osteoarthritis, unspecified hip: Secondary | ICD-10-CM | POA: Diagnosis present

## 2012-05-19 DIAGNOSIS — R112 Nausea with vomiting, unspecified: Secondary | ICD-10-CM | POA: Diagnosis present

## 2012-05-19 DIAGNOSIS — N186 End stage renal disease: Secondary | ICD-10-CM | POA: Diagnosis present

## 2012-05-19 DIAGNOSIS — M5030 Other cervical disc degeneration, unspecified cervical region: Secondary | ICD-10-CM | POA: Diagnosis present

## 2012-05-19 DIAGNOSIS — E875 Hyperkalemia: Secondary | ICD-10-CM | POA: Diagnosis present

## 2012-05-19 DIAGNOSIS — I10 Essential (primary) hypertension: Secondary | ICD-10-CM | POA: Diagnosis present

## 2012-05-19 DIAGNOSIS — M51379 Other intervertebral disc degeneration, lumbosacral region without mention of lumbar back pain or lower extremity pain: Secondary | ICD-10-CM | POA: Diagnosis present

## 2012-05-19 DIAGNOSIS — I798 Other disorders of arteries, arterioles and capillaries in diseases classified elsewhere: Secondary | ICD-10-CM

## 2012-05-19 DIAGNOSIS — Z951 Presence of aortocoronary bypass graft: Secondary | ICD-10-CM

## 2012-05-19 DIAGNOSIS — Z89519 Acquired absence of unspecified leg below knee: Secondary | ICD-10-CM

## 2012-05-19 DIAGNOSIS — I2510 Atherosclerotic heart disease of native coronary artery without angina pectoris: Secondary | ICD-10-CM | POA: Diagnosis present

## 2012-05-19 DIAGNOSIS — K259 Gastric ulcer, unspecified as acute or chronic, without hemorrhage or perforation: Secondary | ICD-10-CM | POA: Diagnosis present

## 2012-05-19 DIAGNOSIS — Z8673 Personal history of transient ischemic attack (TIA), and cerebral infarction without residual deficits: Secondary | ICD-10-CM

## 2012-05-19 DIAGNOSIS — K219 Gastro-esophageal reflux disease without esophagitis: Secondary | ICD-10-CM | POA: Diagnosis present

## 2012-05-19 DIAGNOSIS — E7800 Pure hypercholesterolemia, unspecified: Secondary | ICD-10-CM | POA: Diagnosis present

## 2012-05-19 DIAGNOSIS — Z79899 Other long term (current) drug therapy: Secondary | ICD-10-CM

## 2012-05-19 DIAGNOSIS — M109 Gout, unspecified: Secondary | ICD-10-CM | POA: Diagnosis present

## 2012-05-19 DIAGNOSIS — Z886 Allergy status to analgesic agent status: Secondary | ICD-10-CM

## 2012-05-19 DIAGNOSIS — K449 Diaphragmatic hernia without obstruction or gangrene: Secondary | ICD-10-CM | POA: Diagnosis present

## 2012-05-19 DIAGNOSIS — E1159 Type 2 diabetes mellitus with other circulatory complications: Secondary | ICD-10-CM | POA: Diagnosis present

## 2012-05-19 HISTORY — DX: Pure hypercholesterolemia, unspecified: E78.00

## 2012-05-19 HISTORY — DX: Cerebral infarction, unspecified: I63.9

## 2012-05-19 HISTORY — DX: Other intervertebral disc degeneration, lumbar region: M51.36

## 2012-05-19 HISTORY — DX: Unspecified asthma, uncomplicated: J45.909

## 2012-05-19 HISTORY — DX: Atherosclerotic heart disease of native coronary artery without angina pectoris: I25.10

## 2012-05-19 HISTORY — DX: Other cervical disc degeneration, unspecified cervical region: M50.30

## 2012-05-19 HISTORY — DX: Unspecified osteoarthritis, unspecified site: M19.90

## 2012-05-19 HISTORY — DX: Other intervertebral disc degeneration, lumbar region without mention of lumbar back pain or lower extremity pain: M51.369

## 2012-05-19 LAB — CBC WITH DIFFERENTIAL/PLATELET
Basophils Absolute: 0 10*3/uL (ref 0.0–0.1)
Basophils Relative: 0 % (ref 0–1)
MCHC: 34.6 g/dL (ref 30.0–36.0)
Monocytes Absolute: 0.2 10*3/uL (ref 0.1–1.0)
Neutro Abs: 7.7 10*3/uL (ref 1.7–7.7)
Neutrophils Relative %: 85 % — ABNORMAL HIGH (ref 43–77)
Platelets: 256 10*3/uL (ref 150–400)
RDW: 14 % (ref 11.5–15.5)
WBC: 9.1 10*3/uL (ref 4.0–10.5)

## 2012-05-19 LAB — LACTIC ACID, PLASMA: Lactic Acid, Venous: 1 mmol/L (ref 0.5–2.2)

## 2012-05-19 LAB — URINALYSIS, ROUTINE W REFLEX MICROSCOPIC
Bilirubin Urine: NEGATIVE
Leukocytes, UA: NEGATIVE
Nitrite: NEGATIVE
Specific Gravity, Urine: 1.015 (ref 1.005–1.030)
Urobilinogen, UA: 0.2 mg/dL (ref 0.0–1.0)

## 2012-05-19 LAB — COMPREHENSIVE METABOLIC PANEL
ALT: 15 U/L (ref 0–53)
Albumin: 3 g/dL — ABNORMAL LOW (ref 3.5–5.2)
Alkaline Phosphatase: 69 U/L (ref 39–117)
Glucose, Bld: 148 mg/dL — ABNORMAL HIGH (ref 70–99)
Potassium: 5.7 mEq/L — ABNORMAL HIGH (ref 3.5–5.1)
Sodium: 137 mEq/L (ref 135–145)
Total Protein: 7.3 g/dL (ref 6.0–8.3)

## 2012-05-19 MED ORDER — ACETAMINOPHEN 325 MG PO TABS
650.0000 mg | ORAL_TABLET | Freq: Four times a day (QID) | ORAL | Status: DC | PRN
  Filled 2012-05-19: qty 2

## 2012-05-19 MED ORDER — SODIUM CHLORIDE 0.9 % IV SOLN
INTRAVENOUS | Status: DC
  Administered 2012-05-19: 17:00:00 via INTRAVENOUS

## 2012-05-19 MED ORDER — ONDANSETRON HCL 4 MG PO TABS: 4.0000 mg | ORAL_TABLET | Freq: Four times a day (QID) | ORAL | Status: DC | PRN

## 2012-05-19 MED ORDER — ONDANSETRON HCL 4 MG/2ML IJ SOLN
4.0000 mg | INTRAMUSCULAR | Status: DC | PRN
  Administered 2012-05-19: 4 mg via INTRAVENOUS
  Filled 2012-05-19: qty 2

## 2012-05-19 MED ORDER — OXYCODONE HCL 5 MG PO TABS
5.0000 mg | ORAL_TABLET | ORAL | Status: DC | PRN
  Administered 2012-05-19 – 2012-05-21 (×3): 5 mg via ORAL
  Filled 2012-05-19 (×3): qty 1

## 2012-05-19 MED ORDER — SODIUM CHLORIDE 0.9 % IV SOLN
INTRAVENOUS | Status: DC
  Administered 2012-05-19 – 2012-05-20 (×4): via INTRAVENOUS
  Administered 2012-05-21: 1000 mL via INTRAVENOUS

## 2012-05-19 MED ORDER — FLUTICASONE PROPIONATE 50 MCG/ACT NA SUSP
2.0000 | Freq: Every day | NASAL | Status: DC
  Administered 2012-05-21 – 2012-05-25 (×3): 2 via NASAL
  Filled 2012-05-19: qty 16

## 2012-05-19 MED ORDER — SIMVASTATIN 20 MG PO TABS
40.0000 mg | ORAL_TABLET | Freq: Every day | ORAL | Status: DC
  Administered 2012-05-19 – 2012-05-20 (×2): 40 mg via ORAL
  Filled 2012-05-19 (×2): qty 2

## 2012-05-19 MED ORDER — NITROGLYCERIN 0.4 MG SL SUBL
0.4000 mg | SUBLINGUAL_TABLET | SUBLINGUAL | Status: DC | PRN
  Administered 2012-05-19: 0.4 mg via SUBLINGUAL
  Filled 2012-05-19: qty 25

## 2012-05-19 MED ORDER — ONDANSETRON HCL 4 MG/2ML IJ SOLN
4.0000 mg | Freq: Four times a day (QID) | INTRAMUSCULAR | Status: DC | PRN
  Administered 2012-05-20 (×2): 4 mg via INTRAVENOUS
  Filled 2012-05-19 (×2): qty 2

## 2012-05-19 MED ORDER — ASPIRIN EC 81 MG PO TBEC
81.0000 mg | DELAYED_RELEASE_TABLET | Freq: Every morning | ORAL | Status: DC
  Administered 2012-05-20 – 2012-05-21 (×2): 81 mg via ORAL
  Filled 2012-05-19 (×2): qty 1

## 2012-05-19 MED ORDER — MORPHINE SULFATE 2 MG/ML IJ SOLN
2.0000 mg | INTRAMUSCULAR | Status: DC | PRN
  Administered 2012-05-20 (×2): 2 mg via INTRAVENOUS
  Filled 2012-05-19 (×2): qty 1

## 2012-05-19 MED ORDER — ASPIRIN 81 MG PO CHEW
324.0000 mg | CHEWABLE_TABLET | Freq: Once | ORAL | Status: AC
  Administered 2012-05-19: 324 mg via ORAL
  Filled 2012-05-19: qty 4

## 2012-05-19 MED ORDER — INSULIN ASPART 100 UNIT/ML ~~LOC~~ SOLN
0.0000 [IU] | Freq: Three times a day (TID) | SUBCUTANEOUS | Status: DC
  Administered 2012-05-20 (×2): 1 [IU] via SUBCUTANEOUS
  Administered 2012-05-20: 3 [IU] via SUBCUTANEOUS
  Administered 2012-05-21 (×2): 1 [IU] via SUBCUTANEOUS
  Administered 2012-05-22: 2 [IU] via SUBCUTANEOUS
  Administered 2012-05-22: 1 [IU] via SUBCUTANEOUS
  Administered 2012-05-23: 2 [IU] via SUBCUTANEOUS
  Administered 2012-05-24: 1 [IU] via SUBCUTANEOUS
  Administered 2012-05-24 – 2012-05-25 (×3): 2 [IU] via SUBCUTANEOUS

## 2012-05-19 MED ORDER — SODIUM CHLORIDE 0.9 % IJ SOLN
3.0000 mL | Freq: Two times a day (BID) | INTRAMUSCULAR | Status: DC
  Administered 2012-05-19 – 2012-05-21 (×5): 3 mL via INTRAVENOUS
  Administered 2012-05-22: 22:00:00 via INTRAVENOUS
  Administered 2012-05-23 – 2012-05-25 (×2): 3 mL via INTRAVENOUS

## 2012-05-19 MED ORDER — HEPARIN SODIUM (PORCINE) 5000 UNIT/ML IJ SOLN
5000.0000 [IU] | Freq: Three times a day (TID) | INTRAMUSCULAR | Status: AC
  Administered 2012-05-19 – 2012-05-23 (×12): 5000 [IU] via SUBCUTANEOUS
  Filled 2012-05-19 (×12): qty 1

## 2012-05-19 MED ORDER — ALBUTEROL SULFATE (5 MG/ML) 0.5% IN NEBU
2.5000 mg | INHALATION_SOLUTION | RESPIRATORY_TRACT | Status: DC | PRN
  Filled 2012-05-19: qty 0.5

## 2012-05-19 MED ORDER — SODIUM POLYSTYRENE SULFONATE 15 GM/60ML PO SUSP
30.0000 g | Freq: Once | ORAL | Status: AC
  Administered 2012-05-19: 30 g via ORAL
  Filled 2012-05-19: qty 120

## 2012-05-19 MED ORDER — HYDRALAZINE HCL 20 MG/ML IJ SOLN
10.0000 mg | Freq: Four times a day (QID) | INTRAMUSCULAR | Status: DC | PRN
  Administered 2012-05-19: 10 mg via INTRAVENOUS
  Filled 2012-05-19: qty 1

## 2012-05-19 MED ORDER — MORPHINE SULFATE 4 MG/ML IJ SOLN
4.0000 mg | INTRAMUSCULAR | Status: AC | PRN
  Administered 2012-05-19 (×2): 4 mg via INTRAVENOUS
  Filled 2012-05-19 (×2): qty 1

## 2012-05-19 MED ORDER — ACETAMINOPHEN 650 MG RE SUPP
650.0000 mg | Freq: Four times a day (QID) | RECTAL | Status: DC | PRN
  Filled 2012-05-19: qty 1

## 2012-05-19 NOTE — ED Notes (Signed)
Complain of chest pain that started around 1330. States he had had this problem before and was told it was arthritis. States this pain feels different.

## 2012-05-19 NOTE — ED Notes (Signed)
232ml on bladder scanner

## 2012-05-19 NOTE — ED Notes (Signed)
Pt's wife left a number to call, her name is Errik Buchanon, [phone]

## 2012-05-19 NOTE — ED Provider Notes (Signed)
History     CSN: [id number]  Arrival date & time 05/19/12  1607   First MD Initiated Contact with Patient 05/19/12 1617      Chief Complaint  Patient presents with  . Chest Pain  . Cough  . Fatigue     HPI Pt was seen at 1620.   Per pt, c/o gradual onset and persistence of constant generalized weakness and fatigue for the past 1 week.  Pt's wife states he has been having an intermittent cough; was seen by his PMD for same last week, dx bronchitis and "started on steroids."  Since that time, pt has not been taking any of his usual meds, has had poor PO intake, as well as continued to cough. Pt states he started to have left sided chest "pain" and "swelling" today while watching TV approx 1330.  Pt describes the pain as "sharp" and "shooting," worsening with palpation of the area and body position changes. Denies palpitations, no back pain, no SOB, no abd pain, no N/V/D, no rash, no fevers.     Past Medical History  Diagnosis Date  . Diabetes mellitus   . Hypertension   . Gout   . Coronary artery disease   . Stroke   . Asthma   . Renal insufficiency   . Hypercholesteremia   . Arthritis   . DDD (degenerative disc disease), lumbar   . DDD (degenerative disc disease), cervical     Past Surgical History  Procedure Laterality Date  . Coronary artery bypass graft    . Below knee leg amputation Left     History  Substance Use Topics  . Smoking status: Never Smoker   . Smokeless tobacco: Not on file  . Alcohol Use: No      Review of Systems ROS: Statement: All systems negative except as marked or noted in the HPI; Constitutional: Negative for fever and chills. +generalized weakness/fatigue. ; ; Eyes: Negative for eye pain, redness and discharge. ; ; ENMT: Negative for ear pain, hoarseness, nasal congestion, sinus pressure and sore throat. ; ; Cardiovascular: +CP. Negative for palpitations, diaphoresis, dyspnea and peripheral edema. ; ; Respiratory: +cough. Negative for  wheezing and stridor. ; ; Gastrointestinal: Negative for nausea, vomiting, diarrhea, abdominal pain, blood in stool, hematemesis, jaundice and rectal bleeding. . ; ; Genitourinary: Negative for dysuria, flank pain and hematuria. ; ; Musculoskeletal: Negative for back pain and neck pain. Negative for swelling and trauma.; ; Skin: Negative for pruritus, rash, abrasions, blisters, bruising and skin lesion.; ; Neuro: Negative for headache, lightheadedness and neck stiffness. Negative for weakness, altered level of consciousness , altered mental status, extremity weakness, paresthesias, involuntary movement, seizure and syncope.       Allergies  Codeine and Yellow jacket venom  Home Medications   Current Outpatient Rx  Name  Route  Sig  Dispense  Refill  . Ascorbic Acid (VITAMIN C PO)   Oral   Take 1 tablet by mouth daily.         Marland Kitchen aspirin EC 81 MG tablet   Oral   Take 81 mg by mouth every morning.         . Coenzyme Q10 (CO Q 10 PO)   Oral   Take 1 tablet by mouth daily.         . diazepam (VALIUM) 5 MG tablet   Oral   Take 1 tablet (5 mg total) by mouth every 6 (six) hours as needed (spasm).   10 tablet  0   . enalapril (VASOTEC) 5 MG tablet   Oral   Take 5 mg by mouth daily.         . furosemide (LASIX) 40 MG tablet   Oral   Take 20 mg by mouth 2 (two) times daily.           Marland Kitchen glipiZIDE (GLUCOTROL) 10 MG tablet   Oral   Take 10 mg by mouth 2 (two) times daily.           . insulin NPH (HUMULIN N,NOVOLIN N) 100 UNIT/ML injection   Subcutaneous   Inject 10 Units into the skin at bedtime. Use according to sliding scale.         . Multiple Vitamins-Minerals (MULTIVITAMINS THER. W/MINERALS) TABS   Oral   Take 1 tablet by mouth daily.           . nitroGLYCERIN (NITROSTAT) 0.4 MG SL tablet   Sublingual   Place 0.4 mg under the tongue every 5 (five) minutes as needed.         Marland Kitchen oxyCODONE-acetaminophen (PERCOCET/ROXICET) 5-325 MG per tablet   Oral    Take 1-2 tablets by mouth every 6 (six) hours as needed for pain.   15 tablet   0   . oxyCODONE-acetaminophen (PERCOCET/ROXICET) 5-325 MG per tablet   Oral   Take 1-2 tablets by mouth every 6 (six) hours as needed for pain.   15 tablet   0   . pravastatin (PRAVACHOL) 80 MG tablet   Oral   Take 80 mg by mouth every evening.            BP 164/68  Pulse 80  Temp(Src) 97.5 F (36.4 C) (Oral)  Resp 18  Ht 5\' 9"  (1.753 m)  Wt 190 lb (86.183 kg)  BMI 28.05 kg/m2  SpO2 97%  Physical Exam 1625: Physical examination:  Nursing notes reviewed; Vital signs and O2 SAT reviewed;  Constitutional: Well developed, Well nourished, In no acute distress; Head:  Normocephalic, atraumatic; Eyes: EOMI, PERRL, No scleral icterus; ENMT: Mouth and pharynx normal, Mucous membranes dry; Neck: Supple, Full range of motion, No lymphadenopathy; Cardiovascular: Regular rate and rhythm, No gallop; Respiratory: Breath sounds clear & equal bilaterally, No rales, rhonchi, wheezes.  Speaking full sentences with ease, Normal respiratory effort/excursion; Chest: +left upper chest wall tender to palp. No rash, no soft tissue crepitus. Localized mild left breast swelling, no nipple discharge or retrax. Movement normal; Abdomen: Soft, Nontender, Nondistended, Normal bowel sounds; Genitourinary: No CVA tenderness; Extremities: Pulses normal, No tenderness, No edema, +left BKA.; Neuro: AA&Ox3, Major CN grossly intact.  Speech clear. No gross focal motor or sensory deficits in extremities.; Skin: Color normal, Warm, Dry.   ED Course  Procedures     MDM  MDM Reviewed: previous chart, nursing note and vitals Reviewed previous: ECG and labs Interpretation: ECG, labs and x-ray    Date: 05/19/2012  Rate: 84  Rhythm: normal sinus rhythm and sinus arrhythmia  QRS Axis: left  Intervals: normal  ST/T Wave abnormalities: normal  Conduction Disutrbances:none  Narrative Interpretation:   Old EKG Reviewed: unchanged; no  significant changes from previous EKG dated 07/20/2010.  Results for orders placed during the hospital encounter of 05/19/12  CBC WITH DIFFERENTIAL      Result Value Range   WBC 9.1  4.0 - 10.5 K/uL   RBC 4.03 (*) 4.22 - 5.81 MIL/uL   Hemoglobin 11.7 (*) 13.0 - 17.0 g/dL   HCT 33.8 (*) 39.0 - 52.0 %  MCV 83.9  78.0 - 100.0 fL   MCH 29.0  26.0 - 34.0 pg   MCHC 34.6  30.0 - 36.0 g/dL   RDW 14.0  11.5 - 15.5 %   Platelets 256  150 - 400 K/uL   Neutrophils Relative 85 (*) 43 - 77 %   Neutro Abs 7.7  1.7 - 7.7 K/uL   Lymphocytes Relative 13  12 - 46 %   Lymphs Abs 1.1  0.7 - 4.0 K/uL   Monocytes Relative 3  3 - 12 %   Monocytes Absolute 0.2  0.1 - 1.0 K/uL   Eosinophils Relative 0  0 - 5 %   Eosinophils Absolute 0.0  0.0 - 0.7 K/uL   Basophils Relative 0  0 - 1 %   Basophils Absolute 0.0  0.0 - 0.1 K/uL  URINALYSIS, ROUTINE W REFLEX MICROSCOPIC      Result Value Range   Color, Urine YELLOW  YELLOW   APPearance CLEAR  CLEAR   Specific Gravity, Urine 1.015  1.005 - 1.030   pH 5.5  5.0 - 8.0   Glucose, UA NEGATIVE  NEGATIVE mg/dL   Hgb urine dipstick TRACE (*) NEGATIVE   Bilirubin Urine NEGATIVE  NEGATIVE   Ketones, ur NEGATIVE  NEGATIVE mg/dL   Protein, ur TRACE (*) NEGATIVE mg/dL   Urobilinogen, UA 0.2  0.0 - 1.0 mg/dL   Nitrite NEGATIVE  NEGATIVE   Leukocytes, UA NEGATIVE  NEGATIVE  LACTIC ACID, PLASMA      Result Value Range   Lactic Acid, Venous 1.0  0.5 - 2.2 mmol/L  TROPONIN I      Result Value Range   Troponin I <0.30  <0.30 ng/mL  COMPREHENSIVE METABOLIC PANEL      Result Value Range   Sodium 137  135 - 145 mEq/L   Potassium 5.7 (*) 3.5 - 5.1 mEq/L   Chloride 96  96 - 112 mEq/L   CO2 16 (*) 19 - 32 mEq/L   Glucose, Bld 148 (*) 70 - 99 mg/dL   BUN 172 (*) 6 - 23 mg/dL   Creatinine, Ser 17.54 (*) 0.50 - 1.35 mg/dL   Calcium 9.1  8.4 - 10.5 mg/dL   Total Protein 7.3  6.0 - 8.3 g/dL   Albumin 3.0 (*) 3.5 - 5.2 g/dL   AST 15  0 - 37 U/L   ALT 15  0 - 53 U/L    Alkaline Phosphatase 69  39 - 117 U/L   Total Bilirubin 0.2 (*) 0.3 - 1.2 mg/dL   GFR calc non Af Amer 2 (*) >90 mL/min   GFR calc Af Amer 3 (*) >90 mL/min  LIPASE, BLOOD      Result Value Range   Lipase 51  11 - 59 U/L  URINE MICROSCOPIC-ADD ON      Result Value Range   WBC, UA 0-2  <3 WBC/hpf   RBC / HPF 0-2  <3 RBC/hpf   Dg Chest 2 View 05/19/2012  *RADIOLOGY REPORT*  Clinical Data: Cough, possible infiltrate  CHEST - 2 VIEW  Comparison: 10/28/2011  Findings: Cardiomediastinal silhouette is stable.  Elevation of the left hemidiaphragm.  No acute infiltrate or pulmonary edema. Dextroscoliosis lower thoracic spine.  IMPRESSION: No active disease.  Elevation of the left hemidiaphragm. Dextroscoliosis of the lower thoracic spine.   Original Report Authenticated By: Lahoma Crocker, M.D.     Results for JAXSTIN, ZENISEK (MRN [id number]) as of 05/19/2012 18:07  Ref. Range 10/28/2011 14:47 11/04/2011  18:15 05/19/2012 16:35  BUN Latest Range: 6-23 mg/dL 20 31 (H) 172 (H)  Creatinine Latest Range: 0.50-1.35 mg/dL 1.48 (H) 1.91 (H) 17.54 (H)    Results for TERRIANCE, PAVESE (MRN [id number]) as of 05/19/2012 18:07  Ref. Range 07/20/2010 14:51 10/28/2011 14:47 11/04/2011 18:15 05/19/2012 16:35  Hemoglobin Latest Range: 13.0-17.0 g/dL 10.9 (L) 14.1 11.7 (L) 11.7 (L)  HCT Latest Range: 39.0-52.0 % 33.9 (L) 41.5 34.8 (L) 33.8 (L)      1830:  Bladder scan with 280ml and pt c/o need to void. BUN/Cr significantly elevated from pt's baseline today. H/H stable. CT A/P pending to r/o obstruction as cause for ARF.  Dx and testing d/w pt and family.  Questions answered.  Verb understanding, agreeable to admit.  T/C to [facility] Dr. Roderic Palau, case discussed, including:  HPI, pertinent PM/SHx, VS/PE, dx testing, ED course and treatment:  Agreeable to admit, requests to write temporary orders, obtain stepdown bed to team 2, aware CT A/P pending.   Ct Abdomen Pelvis Wo Contrast 05/19/2012  *RADIOLOGY REPORT*  Clinical Data: Left-sided chest  pain  CT ABDOMEN AND PELVIS WITHOUT CONTRAST  Technique:  Multidetector CT imaging of the abdomen and pelvis was performed following the standard protocol without intravenous contrast.  Comparison: 10/28/2011  Findings: Linear atelectasis or scar at the left base.  No hydronephrosis.  No definite ureteral calculus.  No definite renal calculus.  Perinephric stranding is symmetrical and unchanged.  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  Calcified granulomata in the liver.  No mass.  Post cholecystectomy.  Spleen, pancreas, adrenal glands are within normal limits.  No disproportionate dilatation of bowel.  Atherosclerotic calcifications of the aorta, visceral vasculature, and iliac vessels are noted.  Appendix is not visualized.  Stable appearance of the bladder and prostate with prostate enlargement.  No free fluid.  Advanced degenerative changes within the lumbar spine.  Vacuum disc is noted at multiple levels.  No definite wedge compression deformity in the lumbar spine.  There is a left-sided wedge compression deformity of T8 which has a chronic appearance.  There is severe deformity of the right femoral head with cystic changes and collapse of the articular surface.  There is also severe irregularity of the articular surface of the acetabulum and humeral head.  Cystic changes are noted.  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  No evidence of urinary obstruction.  There is deformity of the right femoral head and hip joint as described.   Original Report Authenticated By: Marybelle Killings, M.D.         Alfonzo Feller, DO 05/21/12 2104

## 2012-05-19 NOTE — ED Notes (Signed)
Pt reports nitro did not change his pain.  EDP aware, 2nd and 3rd nitro not given.  2nd dose of morphine given.

## 2012-05-19 NOTE — ED Notes (Signed)
Pt reports chest pain that started this morning while watching TV.  Says pain is sharp and "shoots up into his head."  Pt also has swelling noted to left chest.  Wife says swelling is new.  Pt also has been on steroids recently for acute bronchitis.

## 2012-05-19 NOTE — H&P (Addendum)
[facility] History and Physical  Alan [name] [id number] DOB: Jun 06, 1940 DOA: 05/19/2012   PCP: Marval Regal, MD  Specialists: He has been seen by [facility] and vascular in the past, but not recently. He was seen by a nephrologist in [place], few years ago, but has not been following up with them either.  Chief Complaint: Chest pain  HPI: Alan [name] is a 72 y.o. male with a past medical history of cardiac disease, diabetes, hypertension, who was in his usual state of health earlier today when he was watching television with his wife when he started having left-sided chest pain. Was 9/10 in intensity. Was a sharp pain without any radiation. Denies any palpitations. Had some nausea, which has been ongoing for 2 weeks. No fever. No chills. He says that pushing on his chest makes the pain worse. He does have a history of acid reflux disease. He had some shortness of breath earlier today, and has had a dry cough. He admits to having heart disease, for which he underwent bypass about 20 years ago and stent placement 2 years ago. His wife also tells me that about 2 weeks ago he was diagnosed with acute bronchitis and was treated with steroids. She states that he was never put on antibiotics. Ever since then, he's been having poor oral intake. He has not been consuming any fluids. He's had dry heaves. He also had some abdominal pain once in a while, but none currently. He has also noticed decreased amount of urination, but denies any blood in the urine or burning sensation with urination. Denies any changes to his medication apart from the steroids for the bronchitis.  Home Medications: Prior to Admission medications   Medication Sig Start Date End Date Taking? Authorizing Provider  Ascorbic Acid (VITAMIN C PO) Take 1 tablet by mouth every morning.    Yes Historical Provider, MD  aspirin EC 81 MG tablet Take 81 mg by mouth every morning.   Yes Historical Provider, MD   Coenzyme Q10 (CO Q 10 PO) Take 1 tablet by mouth daily.   Yes Historical Provider, MD  enalapril (VASOTEC) 5 MG tablet Take 5 mg by mouth daily.   Yes Historical Provider, MD  fluticasone (FLONASE) 50 MCG/ACT nasal spray Place 2 sprays into the nose daily.   Yes Historical Provider, MD  furosemide (LASIX) 40 MG tablet Take 40 mg by mouth 2 (two) times daily.    Yes Historical Provider, MD  insulin NPH (HUMULIN N,NOVOLIN N) 100 UNIT/ML injection Inject 10 Units into the skin at bedtime. Use according to sliding scale.   Yes Historical Provider, MD  methylPREDNISolone (MEDROL) 4 MG tablet Take 4 mg by mouth daily. To takes as directed per package instructions 05/14/12 05/19/12 Yes Historical Provider, MD  pravastatin (PRAVACHOL) 80 MG tablet Take 80 mg by mouth every evening.    Yes Historical Provider, MD  Multiple Vitamins-Minerals (MULTIVITAMINS THER. W/MINERALS) TABS Take 1 tablet by mouth daily.      Historical Provider, MD  nitroGLYCERIN (NITROSTAT) 0.4 MG SL tablet Place 0.4 mg under the tongue every 5 (five) minutes as needed.    Historical Provider, MD    Allergies:  Allergies  Allergen Reactions  . Codeine Itching and Nausea And Vomiting  . Yellow Jacket Venom (Bee Venom) Swelling    Past Medical History: Past Medical History  Diagnosis Date  . Diabetes mellitus   . Hypertension   . Gout   . Coronary artery disease   .  Stroke   . Asthma   . Renal insufficiency   . Hypercholesteremia   . Arthritis   . DDD (degenerative disc disease), lumbar   . DDD (degenerative disc disease), cervical     Past Surgical History  Procedure Laterality Date  . Coronary artery bypass graft    . Below knee leg amputation Left     Social History:  reports that he has never smoked. He does not have any smokeless tobacco history on file. He reports that he does not drink alcohol or use illicit drugs.  Living Situation: He lives with his wife Activity Level: He has amputation the left lower  leg. 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   Family History: Admits to history of, diabetes, and stroke in the family  Review of Systems - History obtained from the patient General ROS: positive for  - fatigue Psychological ROS: negative Ophthalmic ROS: negative ENT ROS: negative Allergy and Immunology ROS: negative Hematological and Lymphatic ROS: negative Endocrine ROS: negative Respiratory ROS: no cough, shortness of breath, or wheezing Cardiovascular ROS: as in hpi Gastrointestinal ROS: as in hpi Genito-Urinary ROS: as in hpi Musculoskeletal ROS: positive for - joint pain Neurological ROS: no TIA or stroke symptoms Dermatological ROS: negative  Physical Examination  Filed Vitals:   05/19/12 1803 05/19/12 1810 05/19/12 1900 05/19/12 1914  BP: 197/74 119/64 134/70 185/89  Pulse: 82 86 95 86  Temp:      TempSrc:      Resp: 18  19 18   Height:      Weight:      SpO2: 100% 96% 98% 98%    General appearance: alert, cooperative, appears stated age and no distress Head: Normocephalic, without obvious abnormality, atraumatic Eyes: conjunctivae/corneas clear. PERRL, EOM's intact.  Throat: lips, mucosa, and tongue normal; teeth and gums normal Neck: no adenopathy, no carotid bruit, no JVD, supple, symmetrical, trachea midline and thyroid not enlarged, symmetric, no tenderness/mass/nodules Resp: clear to auscultation bilaterally Chest wall: left sided chest wall tenderness. There could be mild swelling over the left chest, though it's difficult to appreciate. I don't see any erythema. Cardio: regular rate and rhythm, S1, S2 normal, no murmur, click, rub or gallop GI: soft, non-tender; bowel sounds normal; no masses,  no organomegaly Extremities: No edema. Right lower extremity. He is status post left BKA. He was unable to lift the right leg off the bed due to pain in the right hip. Pulses: Good radial pulses. Right pedal  pulse was poorly palpable Skin: Skin grafting noted on the chest Lymph nodes: Cervical, supraclavicular, and axillary nodes normal. Neurologic: He is alert and oriented x3. No focal neurological deficits are present.  Laboratory Data: Results for orders placed during the hospital encounter of 05/19/12 (from the past 48 hour(s))  CBC WITH DIFFERENTIAL     Status: Abnormal   Collection Time    05/19/12  4:35 PM      Result Value Range   WBC 9.1  4.0 - 10.5 K/uL   RBC 4.03 (*) 4.22 - 5.81 MIL/uL   Hemoglobin 11.7 (*) 13.0 - 17.0 g/dL   HCT 33.8 (*) 39.0 - 52.0 %   MCV 83.9  78.0 - 100.0 fL   MCH 29.0  26.0 - 34.0 pg   MCHC 34.6  30.0 - 36.0 g/dL   RDW 14.0  11.5 - 15.5 %   Platelets 256  150 - 400 K/uL   Neutrophils  Relative 85 (*) 43 - 77 %   Neutro Abs 7.7  1.7 - 7.7 K/uL   Lymphocytes Relative 13  12 - 46 %   Lymphs Abs 1.1  0.7 - 4.0 K/uL   Monocytes Relative 3  3 - 12 %   Monocytes Absolute 0.2  0.1 - 1.0 K/uL   Eosinophils Relative 0  0 - 5 %   Eosinophils Absolute 0.0  0.0 - 0.7 K/uL   Basophils Relative 0  0 - 1 %   Basophils Absolute 0.0  0.0 - 0.1 K/uL  LACTIC ACID, PLASMA     Status: None   Collection Time    05/19/12  4:35 PM      Result Value Range   Lactic Acid, Venous 1.0  0.5 - 2.2 mmol/L  TROPONIN I     Status: None   Collection Time    05/19/12  4:35 PM      Result Value Range   Troponin I <0.30  <0.30 ng/mL   Comment:            Due to the release kinetics of cTnI,     a negative result within the first hours     of the onset of symptoms does not rule out     myocardial infarction with certainty.     If myocardial infarction is still suspected,     repeat the test at appropriate intervals.  COMPREHENSIVE METABOLIC PANEL     Status: Abnormal   Collection Time    05/19/12  4:35 PM      Result Value Range   Sodium 137  135 - 145 mEq/L   Potassium 5.7 (*) 3.5 - 5.1 mEq/L   Chloride 96  96 - 112 mEq/L   CO2 16 (*) 19 - 32 mEq/L   Glucose, Bld 148 (*)  70 - 99 mg/dL   BUN 172 (*) 6 - 23 mg/dL   Creatinine, Ser 17.54 (*) 0.50 - 1.35 mg/dL   Calcium 9.1  8.4 - 10.5 mg/dL   Total Protein 7.3  6.0 - 8.3 g/dL   Albumin 3.0 (*) 3.5 - 5.2 g/dL   AST 15  0 - 37 U/L   ALT 15  0 - 53 U/L   Alkaline Phosphatase 69  39 - 117 U/L   Total Bilirubin 0.2 (*) 0.3 - 1.2 mg/dL   GFR calc non Af Amer 2 (*) >90 mL/min   GFR calc Af Amer 3 (*) >90 mL/min   Comment:            The eGFR has been calculated     using the CKD EPI equation.     This calculation has not been     validated in all clinical     situations.     eGFR's persistently     <90 mL/min signify     possible Chronic Kidney Disease.  LIPASE, BLOOD     Status: None   Collection Time    05/19/12  4:35 PM      Result Value Range   Lipase 51  11 - 59 U/L  URINALYSIS, ROUTINE W REFLEX MICROSCOPIC     Status: Abnormal   Collection Time    05/19/12  4:56 PM      Result Value Range   Color, Urine YELLOW  YELLOW   APPearance CLEAR  CLEAR   Specific Gravity, Urine 1.015  1.005 - 1.030   pH 5.5  5.0 - 8.0  Glucose, UA NEGATIVE  NEGATIVE mg/dL   Hgb urine dipstick TRACE (*) NEGATIVE   Bilirubin Urine NEGATIVE  NEGATIVE   Ketones, ur NEGATIVE  NEGATIVE mg/dL   Protein, ur TRACE (*) NEGATIVE mg/dL   Urobilinogen, UA 0.2  0.0 - 1.0 mg/dL   Nitrite NEGATIVE  NEGATIVE   Leukocytes, UA NEGATIVE  NEGATIVE  URINE MICROSCOPIC-ADD ON     Status: None   Collection Time    05/19/12  4:56 PM      Result Value Range   WBC, UA 0-2  <3 WBC/hpf   RBC / HPF 0-2  <3 RBC/hpf    Radiology Reports: Ct Abdomen Pelvis Wo Contrast  05/19/2012  *RADIOLOGY REPORT*  Clinical Data: Left-sided chest pain  CT ABDOMEN AND PELVIS WITHOUT CONTRAST  Technique:  Multidetector CT imaging of the abdomen and pelvis was performed following the standard protocol without intravenous contrast.  Comparison: 10/28/2011  Findings: Linear atelectasis or scar at the left base.  No hydronephrosis.  No definite ureteral calculus.   No definite renal calculus.  Perinephric stranding is symmetrical and unchanged.  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  Calcified granulomata in the liver.  No mass.  Post cholecystectomy.  Spleen, pancreas, adrenal glands are within normal limits.  No disproportionate dilatation of bowel.  Atherosclerotic calcifications of the aorta, visceral vasculature, and iliac vessels are noted.  Appendix is not visualized.  Stable appearance of the bladder and prostate with prostate enlargement.  No free fluid.  Advanced degenerative changes within the lumbar spine.  Vacuum disc is noted at multiple levels.  No definite wedge compression deformity in the lumbar spine.  There is a left-sided wedge compression deformity of T8 which has a chronic appearance.  There is severe deformity of the right femoral head with cystic changes and collapse of the articular surface.  There is also severe irregularity of the articular surface of the acetabulum and humeral head.  Cystic changes are noted.  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  No evidence of urinary obstruction.  There is deformity of the right femoral head and hip joint as described.   Original Report Authenticated By: Marybelle Killings, M.D.    Dg Chest 2 View  05/19/2012  *RADIOLOGY REPORT*  Clinical Data: Cough, possible infiltrate  CHEST - 2 VIEW  Comparison: 10/28/2011  Findings: Cardiomediastinal silhouette is stable.  Elevation of the left hemidiaphragm.  No acute infiltrate or pulmonary edema. Dextroscoliosis lower thoracic spine.  IMPRESSION: No active disease.  Elevation of the left hemidiaphragm. Dextroscoliosis of the lower thoracic spine.   Original Report Authenticated By: Lahoma Crocker, M.D.     Electrocardiogram: Sinus rhythm at 84 beats per minute. Some left axis deviation.  Possible Q waves in anterior leads. No acute ST or T-wave changes are noted.  Problem List  Principal Problem:   Acute renal failure Active Problems:   CAD (coronary artery disease)   DM type 2 causing vascular disease   HTN (hypertension), benign   Hyperkalemia   Assessment: This is a 72 year old, African American male, who presents with chest pain, and has been having nausea, vomiting, for 2 weeks. He's found to have acute renal failure. His creatinine was 1.9 in September of 2013. Reason for his renal failure is not entirely clear. Could be because of poor oral intake in the last 2-3 weeks. He denies taking  any antibiotics in the interim. He continues to make urine. He does have mild hyperkalemia. Does not have pulmonary edema. Chest pain is most likely musculoskeletal.  Plan: #1 acute renal failure: Have discussed with Dr. Lowanda Foster. We'll give him IV fluids. He Mancel be given Kayexalate for his hyperkalemia. CT abdomen does not show any hydronephrosis. Foley catheter Jovany be placed to accurately measure urine output. Repeat labs in the morning. Further management Kitt be deferred to nephrology.  #2 chest pain in the setting of known CAD: Appears to be musculoskeletal. EKG does not show any acute findings. The pain was a reproducible by palpation. His wife feels that the left side of his chest is swollen compared to the right. I don't see any obvious fluctuation or swelling or erythema. Chest x-ray does not show any findings. We'll continue to monitor for now. Ideally, he may benefit from nonsteroidals however, because of renal failure we cannot use them. Coronary artery disease, appears to be stable. Continue with aspirin.  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 Check HbA1c. Hold his long-acting insulin due to renal failure.  #4 history of hypertension: Monitor blood pressure closely controlled as needed medications. We Adnan hold his ACE inhibitors because of  hyperkalemia and renal failure.  #5 history of arthritis and right hip: CT scan suggests severe disease in the right hip. Denies any recent injuries. He, says he has been seen by Dr. Philipp Ovens, who is an orthopedic doctor in [place] and but this was a while ago. He may have to be seen by orthopedics again in the future.  #6 anemia, normocytic: Anemia panel Treavor be checked in the morning.  DVT Prophylaxis: Heparin Code Status: Full code Family Communication: Discussed with the patient and his wife  Disposition Plan: Likely return home in improved   Further management decisions Hershell depend on results of further testing and patient's response to treatment.  [facility]  [facility] Pager [phone]  If 7PM-7AM, please contact night-coverage [email or web address] Password [facility]  05/19/2012, 7:48 PM

## 2012-05-19 NOTE — ED Notes (Signed)
Dr. Thurnell Garbe aware pain still at 9.

## 2012-05-19 NOTE — ED Notes (Signed)
Pt stated that he did not want to stand up.

## 2012-05-20 DIAGNOSIS — Z89519 Acquired absence of unspecified leg below knee: Secondary | ICD-10-CM

## 2012-05-20 DIAGNOSIS — R0789 Other chest pain: Secondary | ICD-10-CM | POA: Diagnosis present

## 2012-05-20 DIAGNOSIS — S88119A Complete traumatic amputation at level between knee and ankle, unspecified lower leg, initial encounter: Secondary | ICD-10-CM

## 2012-05-20 LAB — HEMOGLOBIN A1C: Mean Plasma Glucose: 189 mg/dL — ABNORMAL HIGH (ref <117)

## 2012-05-20 LAB — GLUCOSE, CAPILLARY
Glucose-Capillary: 208 mg/dL — ABNORMAL HIGH (ref 70–99)
Glucose-Capillary: 138 mg/dL — ABNORMAL HIGH (ref 70–99)
Glucose-Capillary: 102 mg/dL — ABNORMAL HIGH (ref 70–99)

## 2012-05-20 LAB — IRON AND TIBC
Saturation Ratios: 61 % — ABNORMAL HIGH (ref 20–55)
TIBC: 224 ug/dL (ref 215–435)
UIBC: 87 ug/dL — ABNORMAL LOW (ref 125–400)

## 2012-05-20 LAB — URINE CULTURE

## 2012-05-20 LAB — BASIC METABOLIC PANEL
Chloride: 101 mEq/L (ref 96–112)
Creatinine, Ser: 16.56 mg/dL — ABNORMAL HIGH (ref 0.50–1.35)
GFR calc Af Amer: 3 mL/min — ABNORMAL LOW (ref >90)
Potassium: 5.6 mEq/L — ABNORMAL HIGH (ref 3.5–5.1)

## 2012-05-20 LAB — RETICULOCYTES: Retic Ct Pct: 2.8 % (ref 0.4–3.1)

## 2012-05-20 LAB — CBC
HCT: 32.5 % — ABNORMAL LOW (ref 39.0–52.0)
Hemoglobin: 11.1 g/dL — ABNORMAL LOW (ref 13.0–17.0)
MCH: 28.7 pg (ref 26.0–34.0)
MCHC: 34.2 g/dL (ref 30.0–36.0)

## 2012-05-20 LAB — COMPREHENSIVE METABOLIC PANEL
ALT: 19 U/L (ref 0–53)
AST: 17 U/L (ref 0–37)
Calcium: 8.7 mg/dL (ref 8.4–10.5)
Sodium: 138 mEq/L (ref 135–145)
Total Protein: 6.8 g/dL (ref 6.0–8.3)

## 2012-05-20 LAB — FOLATE: Folate: >20 ng/mL

## 2012-05-20 LAB — VITAMIN B12: Vitamin B-12: 1426 pg/mL — ABNORMAL HIGH (ref 211–911)

## 2012-05-20 LAB — CK: Total CK: 91 U/L (ref 7–232)

## 2012-05-20 LAB — TSH: TSH: 0.733 u[IU]/mL (ref 0.350–4.500)

## 2012-05-20 MED ORDER — HYDRALAZINE HCL 20 MG/ML IJ SOLN
10.0000 mg | Freq: Four times a day (QID) | INTRAMUSCULAR | Status: DC
  Administered 2012-05-20 – 2012-05-22 (×6): 10 mg via INTRAVENOUS
  Filled 2012-05-20 (×6): qty 1

## 2012-05-20 MED ORDER — HYDROMORPHONE HCL PF 1 MG/ML IJ SOLN
0.5000 mg | INTRAMUSCULAR | Status: DC | PRN
  Administered 2012-05-21 (×2): 0.5 mg via INTRAVENOUS
  Filled 2012-05-20 (×2): qty 1

## 2012-05-20 MED ORDER — LIDOCAINE-PRILOCAINE 2.5-2.5 % EX CREA
1.0000 "application " | TOPICAL_CREAM | CUTANEOUS | Status: DC | PRN
  Filled 2012-05-20: qty 5

## 2012-05-20 MED ORDER — ONDANSETRON HCL 4 MG PO TABS
4.0000 mg | ORAL_TABLET | ORAL | Status: DC | PRN
  Filled 2012-05-20: qty 1

## 2012-05-20 MED ORDER — HEPARIN SODIUM (PORCINE) 1000 UNIT/ML DIALYSIS
1000.0000 [IU] | INTRAMUSCULAR | Status: DC | PRN
  Filled 2012-05-20: qty 1

## 2012-05-20 MED ORDER — LIDOCAINE HCL (PF) 1 % IJ SOLN
5.0000 mL | INTRAMUSCULAR | Status: DC | PRN
  Filled 2012-05-20: qty 5

## 2012-05-20 MED ORDER — TUBERCULIN PPD 5 UNIT/0.1ML ID SOLN
5.0000 [IU] | Freq: Once | INTRADERMAL | Status: DC
  Filled 2012-05-20: qty 0.1

## 2012-05-20 MED ORDER — ALTEPLASE 2 MG IJ SOLR
2.0000 mg | Freq: Once | INTRAMUSCULAR | Status: AC | PRN
  Filled 2012-05-20: qty 2

## 2012-05-20 MED ORDER — PENTAFLUOROPROP-TETRAFLUOROETH EX AERO
1.0000 "application " | INHALATION_SPRAY | CUTANEOUS | Status: DC | PRN
  Filled 2012-05-20: qty 103.5

## 2012-05-20 MED ORDER — INSULIN ASPART 100 UNIT/ML IV SOLN
6.0000 [IU] | Freq: Once | INTRAVENOUS | Status: AC
  Administered 2012-05-20: 6 [IU] via INTRAVENOUS

## 2012-05-20 MED ORDER — PROMETHAZINE HCL 25 MG/ML IJ SOLN
6.2500 mg | Freq: Four times a day (QID) | INTRAMUSCULAR | Status: DC | PRN
  Administered 2012-05-20 – 2012-05-23 (×4): 6.25 mg via INTRAVENOUS
  Filled 2012-05-20 (×5): qty 1

## 2012-05-20 MED ORDER — DEXTROSE 50 % IV SOLN
1.0000 | Freq: Once | INTRAVENOUS | Status: AC
  Administered 2012-05-20: 50 mL via INTRAVENOUS

## 2012-05-20 MED ORDER — DEXTROSE 50 % IV SOLN
INTRAVENOUS | Status: AC
  Filled 2012-05-20: qty 50

## 2012-05-20 MED ORDER — SODIUM CHLORIDE 0.9 % IV SOLN: 100.0000 mL | INTRAVENOUS | Status: DC | PRN

## 2012-05-20 MED ORDER — NEPRO/CARBSTEADY PO LIQD
237.0000 mL | ORAL | Status: DC | PRN
  Filled 2012-05-20: qty 237

## 2012-05-20 MED ORDER — ONDANSETRON HCL 4 MG/2ML IJ SOLN
4.0000 mg | Freq: Four times a day (QID) | INTRAMUSCULAR | Status: DC | PRN
  Administered 2012-05-20: 4 mg via INTRAVENOUS
  Filled 2012-05-20: qty 2

## 2012-05-20 MED ORDER — SODIUM POLYSTYRENE SULFONATE 15 GM/60ML PO SUSP
30.0000 g | Freq: Once | ORAL | Status: AC
  Administered 2012-05-20: 30 g via ORAL
  Filled 2012-05-20: qty 120

## 2012-05-20 MED ORDER — SODIUM BICARBONATE 8.4 % IV SOLN
50.0000 meq | Freq: Once | INTRAVENOUS | Status: AC
  Administered 2012-05-20: 50 meq via INTRAVENOUS
  Filled 2012-05-20: qty 50

## 2012-05-20 MED ORDER — ONDANSETRON HCL 4 MG/2ML IJ SOLN
4.0000 mg | INTRAMUSCULAR | Status: DC | PRN
  Administered 2012-05-21 – 2012-05-22 (×3): 4 mg via INTRAVENOUS
  Filled 2012-05-20 (×3): qty 2

## 2012-05-20 MED ORDER — FUROSEMIDE 10 MG/ML IJ SOLN
200.0000 mg | Freq: Two times a day (BID) | INTRAVENOUS | Status: DC
  Administered 2012-05-20 – 2012-05-25 (×8): 200 mg via INTRAVENOUS
  Filled 2012-05-20 (×10): qty 20

## 2012-05-20 MED ORDER — SODIUM CHLORIDE 0.9 % IV SOLN
100.0000 mL | INTRAVENOUS | Status: DC | PRN
  Administered 2012-05-23: 100 mL via INTRAVENOUS

## 2012-05-20 NOTE — Consult Note (Signed)
Reason for Consult: Renal failure Referring Physician: Dr. Carron Brazen is an 72 y.o. male.  HPI: He is a patient who was long-standing history of diabetes, hypertension coronary disease status post CABG presently came with chest pain of about 2 weeks' duration. According to the patient was also having some cough with some whitish sputum. He saw his primary care physician but he was not treated with any antibiotics. Presently came with chest pain admitting described as sharp and midsternal. Presently patient also complains of some nausea vomiting. Consult is called because of high pending creatinine and also potassium. According to patient he had chronic renal failure possibly dating back about 10 years. Initially was found to have renal failure when he is ruptured appendix. It was followed by a physician for a couple of years but then patient is going to his appointment. Hence this moment we don't know how bad his kidney function was. According to his wife at one moment the planning to do dialysis with his renal function improved and they decided to follow him.  Past Medical History  Diagnosis Date  . Diabetes mellitus   . Hypertension   . Gout   . Coronary artery disease   . Stroke   . Asthma   . Renal insufficiency   . Hypercholesteremia   . Arthritis   . DDD (degenerative disc disease), lumbar   . DDD (degenerative disc disease), cervical     Past Surgical History  Procedure Laterality Date  . Coronary artery bypass graft    . Below knee leg amputation Left     History reviewed. No pertinent family history.  Social History:  reports that he has never smoked. He does not have any smokeless tobacco history on file. He reports that he does not drink alcohol or use illicit drugs.  Allergies:  Allergies  Allergen Reactions  . Codeine Itching and Nausea And Vomiting  . Yellow [place]et Venom (Bee Venom) Swelling    Medications: I have reviewed the patient's current  medications.  Results for orders placed during the hospital encounter of 05/19/12 (from the past 48 hour(s))  CBC WITH DIFFERENTIAL     Status: Abnormal   Collection Time    05/19/12  4:35 PM      Result Value Range   WBC 9.1  4.0 - 10.5 K/uL   RBC 4.03 (*) 4.22 - 5.81 MIL/uL   Hemoglobin 11.7 (*) 13.0 - 17.0 g/dL   HCT 33.8 (*) 39.0 - 52.0 %   MCV 83.9  78.0 - 100.0 fL   MCH 29.0  26.0 - 34.0 pg   MCHC 34.6  30.0 - 36.0 g/dL   RDW 14.0  11.5 - 15.5 %   Platelets 256  150 - 400 K/uL   Neutrophils Relative 85 (*) 43 - 77 %   Neutro Abs 7.7  1.7 - 7.7 K/uL   Lymphocytes Relative 13  12 - 46 %   Lymphs Abs 1.1  0.7 - 4.0 K/uL   Monocytes Relative 3  3 - 12 %   Monocytes Absolute 0.2  0.1 - 1.0 K/uL   Eosinophils Relative 0  0 - 5 %   Eosinophils Absolute 0.0  0.0 - 0.7 K/uL   Basophils Relative 0  0 - 1 %   Basophils Absolute 0.0  0.0 - 0.1 K/uL  LACTIC ACID, PLASMA     Status: None   Collection Time    05/19/12  4:35 PM  Result Value Range   Lactic Acid, Venous 1.0  0.5 - 2.2 mmol/L  TROPONIN I     Status: None   Collection Time    05/19/12  4:35 PM      Result Value Range   Troponin I <0.30  <0.30 ng/mL   Comment:            Due to the release kinetics of cTnI,     a negative result within the first hours     of the onset of symptoms does not rule out     myocardial infarction with certainty.     If myocardial infarction is still suspected,     repeat the test at appropriate intervals.  COMPREHENSIVE METABOLIC PANEL     Status: Abnormal   Collection Time    05/19/12  4:35 PM      Result Value Range   Sodium 137  135 - 145 mEq/L   Potassium 5.7 (*) 3.5 - 5.1 mEq/L   Chloride 96  96 - 112 mEq/L   CO2 16 (*) 19 - 32 mEq/L   Glucose, Bld 148 (*) 70 - 99 mg/dL   BUN 172 (*) 6 - 23 mg/dL   Creatinine, Ser 17.54 (*) 0.50 - 1.35 mg/dL   Calcium 9.1  8.4 - 10.5 mg/dL   Total Protein 7.3  6.0 - 8.3 g/dL   Albumin 3.0 (*) 3.5 - 5.2 g/dL   AST 15  0 - 37 U/L   ALT 15   0 - 53 U/L   Alkaline Phosphatase 69  39 - 117 U/L   Total Bilirubin 0.2 (*) 0.3 - 1.2 mg/dL   GFR calc non Af Amer 2 (*) >90 mL/min   GFR calc Af Amer 3 (*) >90 mL/min   Comment:            The eGFR has been calculated     using the CKD EPI equation.     This calculation has not been     validated in all clinical     situations.     eGFR's persistently     <90 mL/min signify     possible Chronic Kidney Disease.  LIPASE, BLOOD     Status: None   Collection Time    05/19/12  4:35 PM      Result Value Range   Lipase 51  11 - 59 U/L  URINALYSIS, ROUTINE W REFLEX MICROSCOPIC     Status: Abnormal   Collection Time    05/19/12  4:56 PM      Result Value Range   Color, Urine YELLOW  YELLOW   APPearance CLEAR  CLEAR   Specific Gravity, Urine 1.015  1.005 - 1.030   pH 5.5  5.0 - 8.0   Glucose, UA NEGATIVE  NEGATIVE mg/dL   Hgb urine dipstick TRACE (*) NEGATIVE   Bilirubin Urine NEGATIVE  NEGATIVE   Ketones, ur NEGATIVE  NEGATIVE mg/dL   Protein, ur TRACE (*) NEGATIVE mg/dL   Urobilinogen, UA 0.2  0.0 - 1.0 mg/dL   Nitrite NEGATIVE  NEGATIVE   Leukocytes, UA NEGATIVE  NEGATIVE  URINE MICROSCOPIC-ADD ON     Status: None   Collection Time    05/19/12  4:56 PM      Result Value Range   WBC, UA 0-2  <3 WBC/hpf   RBC / HPF 0-2  <3 RBC/hpf  TSH     Status: None   Collection Time    05/19/12  9:26 PM      Result Value Range   TSH 0.733  0.350 - 4.500 uIU/mL  GLUCOSE, CAPILLARY     Status: Abnormal   Collection Time    05/19/12  9:37 PM      Result Value Range   Glucose-Capillary 155 (*) 70 - 99 mg/dL   Comment 1 Notify RN    MRSA PCR SCREENING     Status: None   Collection Time    05/19/12 10:41 PM      Result Value Range   MRSA by PCR NEGATIVE  NEGATIVE   Comment:            The GeneXpert MRSA Assay (FDA     approved for NASAL specimens     only), is one component of a     comprehensive MRSA colonization     surveillance program. It is not     intended to diagnose MRSA      infection nor to guide or     monitor treatment for     MRSA infections.  IRON AND TIBC     Status: Abnormal   Collection Time    05/20/12  5:27 AM      Result Value Range   Iron 137 (*) 42 - 135 ug/dL   TIBC 224  215 - 435 ug/dL   Saturation Ratios 61 (*) 20 - 55 %   UIBC 87 (*) 125 - 400 ug/dL  RETICULOCYTES     Status: Abnormal   Collection Time    05/20/12  5:27 AM      Result Value Range   Retic Ct Pct 2.8  0.4 - 3.1 %   RBC. 3.87 (*) 4.22 - 5.81 MIL/uL   Retic Count, Manual 108.4  19.0 - 186.0 K/uL  COMPREHENSIVE METABOLIC PANEL     Status: Abnormal   Collection Time    05/20/12  5:27 AM      Result Value Range   Sodium 138  135 - 145 mEq/L   Potassium 6.0 (*) 3.5 - 5.1 mEq/L   Chloride 100  96 - 112 mEq/L   CO2 16 (*) 19 - 32 mEq/L   Glucose, Bld 148 (*) 70 - 99 mg/dL   BUN 175 (*) 6 - 23 mg/dL   Creatinine, Ser 16.75 (*) 0.50 - 1.35 mg/dL   Calcium 8.7  8.4 - 10.5 mg/dL   Total Protein 6.8  6.0 - 8.3 g/dL   Albumin 2.7 (*) 3.5 - 5.2 g/dL   AST 17  0 - 37 U/L   ALT 19  0 - 53 U/L   Alkaline Phosphatase 64  39 - 117 U/L   Total Bilirubin 0.1 (*) 0.3 - 1.2 mg/dL   GFR calc non Af Amer 2 (*) >90 mL/min   GFR calc Af Amer 3 (*) >90 mL/min   Comment:            The eGFR has been calculated     using the CKD EPI equation.     This calculation has not been     validated in all clinical     situations.     eGFR's persistently     <90 mL/min signify     possible Chronic Kidney Disease.  CBC     Status: Abnormal   Collection Time    05/20/12  5:27 AM      Result Value Range   WBC 8.4  4.0 - 10.5 K/uL   RBC 3.87 (*)  4.22 - 5.81 MIL/uL   Hemoglobin 11.1 (*) 13.0 - 17.0 g/dL   HCT 32.5 (*) 39.0 - 52.0 %   MCV 84.0  78.0 - 100.0 fL   MCH 28.7  26.0 - 34.0 pg   MCHC 34.2  30.0 - 36.0 g/dL   RDW 14.2  11.5 - 15.5 %   Platelets 253  150 - 400 K/uL  GLUCOSE, CAPILLARY     Status: Abnormal   Collection Time    05/20/12  7:33 AM      Result Value Range    Glucose-Capillary 139 (*) 70 - 99 mg/dL   Comment 1 Documented in Chart     Comment 2 Notify RN    MAGNESIUM     Status: Abnormal   Collection Time    05/20/12  9:41 AM      Result Value Range   Magnesium 2.8 (*) 1.5 - 2.5 mg/dL  PHOSPHORUS     Status: Abnormal   Collection Time    05/20/12  9:41 AM      Result Value Range   Phosphorus 8.3 (*) 2.3 - 4.6 mg/dL  BASIC METABOLIC PANEL     Status: Abnormal   Collection Time    05/20/12 11:29 AM      Result Value Range   Sodium 138  135 - 145 mEq/L   Potassium 5.6 (*) 3.5 - 5.1 mEq/L   Chloride 101  96 - 112 mEq/L   CO2 16 (*) 19 - 32 mEq/L   Glucose, Bld 222 (*) 70 - 99 mg/dL   BUN 171 (*) 6 - 23 mg/dL   Creatinine, Ser 16.56 (*) 0.50 - 1.35 mg/dL   Calcium 8.3 (*) 8.4 - 10.5 mg/dL   GFR calc non Af Amer 2 (*) >90 mL/min   GFR calc Af Amer 3 (*) >90 mL/min   Comment:            The eGFR has been calculated     using the CKD EPI equation.     This calculation has not been     validated in all clinical     situations.     eGFR's persistently     <90 mL/min signify     possible Chronic Kidney Disease.  CK     Status: None   Collection Time    05/20/12 11:29 AM      Result Value Range   Total CK 91  7 - 232 U/L  GLUCOSE, CAPILLARY     Status: Abnormal   Collection Time    05/20/12 11:33 AM      Result Value Range   Glucose-Capillary 208 (*) 70 - 99 mg/dL   Comment 1 Documented in Chart     Comment 2 Notify RN      Ct Abdomen Pelvis Wo Contrast  05/19/2012  *RADIOLOGY REPORT*  Clinical Data: Left-sided chest pain  CT ABDOMEN AND PELVIS WITHOUT CONTRAST  Technique:  Multidetector CT imaging of the abdomen and pelvis was performed following the standard protocol without intravenous contrast.  Comparison: 10/28/2011  Findings: Linear atelectasis or scar at the left base.  No hydronephrosis.  No definite ureteral calculus.  No definite renal calculus.  Perinephric stranding is symmetrical and unchanged.  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  Calcified granulomata in the liver.  No mass.  Post cholecystectomy.  Spleen, pancreas, adrenal glands are within normal limits.  No disproportionate dilatation of bowel.  Atherosclerotic calcifications of the aorta, visceral vasculature,  and iliac vessels are noted.  Appendix is not visualized.  Stable appearance of the bladder and prostate with prostate enlargement.  No free fluid.  Advanced degenerative changes within the lumbar spine.  Vacuum disc is noted at multiple levels.  No definite wedge compression deformity in the lumbar spine.  There is a left-sided wedge compression deformity of T8 which has a chronic appearance.  There is severe deformity of the right femoral head with cystic changes and collapse of the articular surface.  There is also severe irregularity of the articular surface of the acetabulum and humeral head.  Cystic changes are noted.  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  No evidence of urinary obstruction.  There is deformity of the right femoral head and hip joint as described.   Original Report Authenticated By: Marybelle Killings, M.D.    Dg Chest 2 View  05/19/2012  *RADIOLOGY REPORT*  Clinical Data: Cough, possible infiltrate  CHEST - 2 VIEW  Comparison: 10/28/2011  Findings: Cardiomediastinal silhouette is stable.  Elevation of the left hemidiaphragm.  No acute infiltrate or pulmonary edema. Dextroscoliosis lower thoracic spine.  IMPRESSION: No active disease.  Elevation of the left hemidiaphragm. Dextroscoliosis of the lower thoracic spine.   Original Report Authenticated By: Lahoma Crocker, M.D.     Review of Systems  Respiratory: Positive for cough, sputum production and shortness of breath.   Cardiovascular: Positive for chest pain.  Gastrointestinal: Positive for nausea, vomiting  and abdominal pain.  Neurological: Positive for weakness.   Blood pressure 159/58, pulse 82, temperature 97.8 F (36.6 C), temperature source Oral, resp. rate 15, height 5\' 9"  (1.753 m), weight 83 kg (182 lb 15.7 oz), SpO2 97.00%. Physical Exam  Constitutional: He is oriented to person, place, and time.  Eyes: No scleral icterus.  Neck: No JVD present.  Cardiovascular: Normal rate and regular rhythm.   Respiratory: No respiratory distress.  GI: He exhibits no distension. There is no tenderness. There is no rebound.  Musculoskeletal: He exhibits no edema.  Neurological: He is alert and oriented to person, place, and time.  Skin: Skin is warm.    Assessment/Plan: Problem #1 renal failure has moment possibly chronic. His BUN and creatinine is very high and seems to have some uremic symptoms. 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 Plan: We'll make arrangements for patient to get dialysis today Lucky call surgery for hemodialysis catheter placement. Lindy use 2K /2.5 calcium bath today and possibly try about a liter of fluid removal. We'll continue dialysis tomorrow for 4 hours. Recheck his hepatitis B surface antigen, hepatitis C antibody, hepatitis surface antibody and also called to be. Hepatitis B. We'll check his intact PTH and phosphorus level in the morning. We'll decrease his IV fluid to 1.5 cc per hour and increased Lasix to 200 mg IV twice a day.  Riyah Bardon S 05/20/2012, 2:27 PM

## 2012-05-20 NOTE — Care Management Note (Signed)
    Page 1 of 1   05/25/2012     11:45:31 AM   CARE MANAGEMENT NOTE 05/25/2012  Patient:  Alan [name], Alan [name]   Account Number:  [id number]  Date Initiated:  05/20/2012  Documentation initiated by:  Claretha Cooper  Subjective/Objective Assessment:   Pt admitted from home where he lives with his wife. CM met with pt and he stated he may need a RN and PT when [place]. CM Stavros follow     Action/Plan:   Anticipated DC Date:     Anticipated DC Plan:        DC Planning Services  CM consult      Choice offered to / List presented to:             Status of service:  Completed, signed off Medicare Important Message given?  YES (If response is "NO", the following Medicare IM given date fields [place] be blank) Date Medicare IM given:  05/25/2012 Date Additional Medicare IM given:    Discharge Disposition:  HOME/SELF CARE  Per UR Regulation:    If discussed at Long Length of Stay Meetings, dates discussed:    Comments:  05/20/12 Claretha Cooper RN [facility]

## 2012-05-20 NOTE — Progress Notes (Signed)
Educational Handouts given:  Renal Failure Hemodialysis

## 2012-05-20 NOTE — Plan of Care (Signed)
Problem: Phase I Progression Outcomes Goal: Education officer, museum consult (if SNF or HD pt.) Outcome: Completed/Met Date Met:  05/20/12 Placed order just now for Social Work for new HD pt, couldn't find documentation of consult being previously ordered or any note from a social work to date Goal: OOB as tolerated unless otherwise ordered Outcome: Not Applicable Date Met:  [id number] Bed rest  Problem: Phase II Progression Outcomes Goal: Dialysis initiated Outcome: Completed/Met Date Met:  05/20/12 First tx today Goal: Tol increased activity, up in chair for at least 4 hrs/HD pt Outcome: Not Met (add Reason) Just started HD today, still has dyspnea at rest

## 2012-05-20 NOTE — Progress Notes (Signed)
Chart reviewed.  Subjective: Feels nauseated.  Chest pain improved with pain medication. Patient reports that he has had several medication changes recently, specifically blood pressure medication.  Patient did not have bowel movement after her Kayexalate.  Objective: Vital signs in last 24 hours: Filed Vitals:   05/20/12 0500 05/20/12 0600 05/20/12 0622 05/20/12 0730  BP: 171/65 173/62    Pulse: 78 78    Temp:    97.5 F (36.4 C)  TempSrc:    Oral  Resp: 15 11    Height:      Weight: 83 kg (182 lb 15.7 oz)     SpO2: 98% 98% 96%    Weight change:   Intake/Output Summary (Last 24 hours) at 05/20/12 0848 Last data filed at 05/20/12 0600  Gross per 24 hour  Intake   1470 ml  Output    450 ml  Net   1020 ml   General: Alert. Oriented. Emesis basin at his side with clear emesis. Comfortable Lungs clear to auscultation bilaterally without wheeze rhonchi or rales Cardiovascular regular rate rhythm without murmurs gallops rubs Abdomen soft nontender nondistended Extremities: Left BKA. Right leg without edema. Skin/musculoskeletal: Left chest tenderness to palpation. No erythema, fluctuance, induration. Large healed surgical scar over the chest centrally. Left wrist with cystic structure. Lipoma noted on the posterior neck.  Lab Results: Basic Metabolic Panel:  Recent Labs Lab 05/19/12 1635 05/20/12 0527  NA 137 138  K 5.7* 6.0*  CL 96 100  CO2 16* 16*  GLUCOSE 148* 148*  BUN 172* 175*  CREATININE 17.54* 16.75*  CALCIUM 9.1 8.7   Liver Function Tests:  Recent Labs Lab 05/19/12 1635 05/20/12 0527  AST 15 17  ALT 15 19  ALKPHOS 69 64  BILITOT 0.2* 0.1*  PROT 7.3 6.8  ALBUMIN 3.0* 2.7*    Recent Labs Lab 05/19/12 1635  LIPASE 51   No results found for this basename: AMMONIA,  in the last 168 hours CBC:  Recent Labs Lab 05/19/12 1635 05/20/12 0527  WBC 9.1 8.4  NEUTROABS 7.7  --   HGB 11.7* 11.1*  HCT 33.8* 32.5*  MCV 83.9 84.0  PLT 256 253    Cardiac Enzymes:  Recent Labs Lab 05/19/12 1635  TROPONINI <0.30   BNP: No results found for this basename: PROBNP,  in the last 168 hours D-Dimer: No results found for this basename: DDIMER,  in the last 168 hours CBG:  Recent Labs Lab 05/19/12 2137 05/20/12 0733  GLUCAP 155* 139*   Hemoglobin A1C: No results found for this basename: HGBA1C,  in the last 168 hours Fasting Lipid Panel: No results found for this basename: CHOL, HDL, LDLCALC, TRIG, CHOLHDL, LDLDIRECT,  in the last 168 hours Thyroid Function Tests: No results found for this basename: TSH, T4TOTAL, FREET4, T3FREE, THYROIDAB,  in the last 168 hours Coagulation: No results found for this basename: LABPROT, INR,  in the last 168 hours Anemia Panel:  Recent Labs Lab 05/20/12 0527  RETICCTPCT 2.8   Urine Drug Screen: Drugs of Abuse  No results found for this basename: labopia, cocainscrnur, labbenz, amphetmu, thcu, labbarb    Alcohol Level: No results found for this basename: ETH,  in the last 168 hours Urinalysis:  Recent Labs Lab 05/19/12 1656  COLORURINE YELLOW  LABSPEC 1.015  PHURINE 5.5  GLUCOSEU NEGATIVE  HGBUR TRACE*  BILIRUBINUR NEGATIVE  KETONESUR NEGATIVE  PROTEINUR TRACE*  UROBILINOGEN 0.2  NITRITE NEGATIVE  LEUKOCYTESUR NEGATIVE   Micro Results: Recent Results (from  the past 240 hour(s))  MRSA PCR SCREENING     Status: None   Collection Time    05/19/12 10:41 PM      Result Value Range Status   MRSA by PCR NEGATIVE  NEGATIVE Final   Comment:            The GeneXpert MRSA Assay (FDA     approved for NASAL specimens     only), is one component of a     comprehensive MRSA colonization     surveillance program. It is not     intended to diagnose MRSA     infection nor to guide or     monitor treatment for     MRSA infections.   Studies/Results: Ct Abdomen Pelvis Wo Contrast  05/19/2012  *RADIOLOGY REPORT*  Clinical Data: Left-sided chest pain  CT ABDOMEN AND PELVIS  WITHOUT CONTRAST  Technique:  Multidetector CT imaging of the abdomen and pelvis was performed following the standard protocol without intravenous contrast.  Comparison: 10/28/2011  Findings: Linear atelectasis or scar at the left base.  No hydronephrosis.  No definite ureteral calculus.  No definite renal calculus.  Perinephric stranding is symmetrical and unchanged.  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  Calcified granulomata in the liver.  No mass.  Post cholecystectomy.  Spleen, pancreas, adrenal glands are within normal limits.  No disproportionate dilatation of bowel.  Atherosclerotic calcifications of the aorta, visceral vasculature, and iliac vessels are noted.  Appendix is not visualized.  Stable appearance of the bladder and prostate with prostate enlargement.  No free fluid.  Advanced degenerative changes within the lumbar spine.  Vacuum disc is noted at multiple levels.  No definite wedge compression deformity in the lumbar spine.  There is a left-sided wedge compression deformity of T8 which has a chronic appearance.  There is severe deformity of the right femoral head with cystic changes and collapse of the articular surface.  There is also severe irregularity of the articular surface of the acetabulum and humeral head.  Cystic changes are noted.  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  No evidence of urinary obstruction.  There is deformity of the right femoral head and hip joint as described.   Original Report Authenticated By: Marybelle Killings, M.D.    Dg Chest 2 View  05/19/2012  *RADIOLOGY REPORT*  Clinical Data: Cough, possible infiltrate  CHEST - 2 VIEW  Comparison: 10/28/2011  Findings: Cardiomediastinal silhouette is stable.  Elevation of the left hemidiaphragm.  No acute infiltrate or pulmonary edema.  Dextroscoliosis lower thoracic spine.  IMPRESSION: No active disease.  Elevation of the left hemidiaphragm. Dextroscoliosis of the lower thoracic spine.   Original Report Authenticated By: Lahoma Crocker, M.D.    Scheduled Meds: . aspirin EC  81 mg Oral q morning - 10a  . fluticasone  2 spray Each Nare Daily  . heparin  5,000 Units Subcutaneous Q8H  . insulin aspart  0-9 Units Subcutaneous TID WC  . simvastatin  40 mg Oral q1800  . sodium chloride  3 mL Intravenous Q12H   Continuous Infusions: . sodium chloride 150 mL/hr at 05/20/12 0600   PRN Meds:.acetaminophen, acetaminophen, albuterol, hydrALAZINE, morphine injection, ondansetron (ZOFRAN) IV, ondansetron, oxyCODONE Assessment/Plan: Principal Problem:   Acute renal failure: No obstruction noted. Possibly medication related and/or prerenal azotemia. Continue IV fluid. ACE inhibitor has been held.  Await nephrology recommendations.  Active Problems:   Hyperkalemia: Has not improved with Kayexalate. We'll repeat a dose if patient is able to keep it down. Selso give bicarbonate, D50 and insulin. Recheck today and in the morning.  Nausea vomiting: Could be from uremia. Patient also received morphine, so may be due to this. Kenniel change to Dilaudid. Add low-dose Phenergan as needed. LFTs and lipase normal.    DM type 2 causing vascular disease: Continue sliding scale    HTN (hypertension), benign, not well controlled. Garan give IV hydralazine until able to take anything by mouth.    CAD (coronary artery disease): Stable    Musculoskeletal chest pain: No NSAIDs due to renal failure and hyperkalemia    S/P BKA (below knee amputation) unilateral, left   LOS: 1 day   Etienne Mowers L 05/20/2012, 8:48 AM

## 2012-05-20 NOTE — Procedures (Signed)
First ever hemodialysis treatment completed through newly placed left femoral catheter.  GOAL MET: Tolerated removal of 1L with no interruption in ultrafiltration.  Sluggish blood flow through catheter; arterial limb aspirated with strong resistance; lines were reversed from onset of treatment.  All blood was reinfused.  Report given to Yehuda Savannah, RN.

## 2012-05-20 NOTE — Procedures (Signed)
Central Venous Catheter Insertion Procedure Note Alan [name] [id number] 1941/02/17  Procedure: Insertion of Central Venous Catheter Indications: Hemodialysis  Procedure Details Consent: Risks of procedure as well as the alternatives and risks of each were explained to the (patient/caregiver).  Consent for procedure obtained. Time Out: Verified patient identification, verified procedure, site/side was marked, verified correct patient position, special equipment/implants available, medications/allergies/relevent history reviewed, required imaging and test results available.  Performed  Maximum sterile technique was used including antiseptics, cap, gloves, gown, hand hygiene, mask and sheet. Skin prep: Iodine solution; local anesthetic administered A antimicrobial bonded/coated double lumen catheter was placed in the left femoral vein due to patient being a dialysis patient using the Seldinger technique.  Evaluation Blood flow good Complications: No apparent complications Patient did tolerate procedure well. Chest X-ray ordered to verify placement.  CXR: not indicated.  Alan [name] C 05/20/2012, 10:06 PM

## 2012-05-20 NOTE — Progress Notes (Signed)
UR Chart Review Completed  

## 2012-05-21 DIAGNOSIS — R0789 Other chest pain: Secondary | ICD-10-CM

## 2012-05-21 LAB — BASIC METABOLIC PANEL
CO2: 18 mEq/L — ABNORMAL LOW (ref 19–32)
Calcium: 8.2 mg/dL — ABNORMAL LOW (ref 8.4–10.5)
Creatinine, Ser: 14.08 mg/dL — ABNORMAL HIGH (ref 0.50–1.35)
GFR calc non Af Amer: 3 mL/min — ABNORMAL LOW (ref >90)
Sodium: 140 mEq/L (ref 135–145)

## 2012-05-21 LAB — PTH, INTACT AND CALCIUM
Calcium, Total (PTH): 7.7 mg/dL — ABNORMAL LOW (ref 8.4–10.5)
PTH: 602.6 pg/mL — ABNORMAL HIGH (ref 14.0–72.0)

## 2012-05-21 LAB — HEPATITIS B SURFACE ANTIBODY,QUALITATIVE: Hep B S Ab: NONREACTIVE

## 2012-05-21 LAB — HEPATITIS B SURFACE ANTIGEN: Hepatitis B Surface Ag: NEGATIVE

## 2012-05-21 LAB — GLUCOSE, CAPILLARY
Glucose-Capillary: 138 mg/dL — ABNORMAL HIGH (ref 70–99)
Glucose-Capillary: 138 mg/dL — ABNORMAL HIGH (ref 70–99)

## 2012-05-21 LAB — HEPATITIS B CORE ANTIBODY, TOTAL: Hep B Core Total Ab: NEGATIVE

## 2012-05-21 LAB — HEPATITIS C ANTIBODY: HCV Ab: NEGATIVE

## 2012-05-21 MED ORDER — LORAZEPAM 2 MG/ML IJ SOLN
0.5000 mg | Freq: Once | INTRAMUSCULAR | Status: AC
  Administered 2012-05-21: 0.5 mg via INTRAVENOUS
  Filled 2012-05-21: qty 1

## 2012-05-21 MED ORDER — SODIUM CHLORIDE 0.9 % IV SOLN: 100.0000 mL | INTRAVENOUS | Status: DC | PRN

## 2012-05-21 MED ORDER — HYDROMORPHONE HCL PF 1 MG/ML IJ SOLN
0.5000 mg | INTRAMUSCULAR | Status: DC | PRN
  Administered 2012-05-21 – 2012-05-22 (×2): 0.5 mg via INTRAVENOUS
  Filled 2012-05-21 (×2): qty 1

## 2012-05-21 MED ORDER — BISACODYL 10 MG RE SUPP
10.0000 mg | Freq: Every day | RECTAL | Status: DC | PRN
  Administered 2012-05-21: 10 mg via RECTAL
  Filled 2012-05-21 (×2): qty 1

## 2012-05-21 MED ORDER — GI COCKTAIL ~~LOC~~
30.0000 mL | Freq: Once | ORAL | Status: AC
  Administered 2012-05-21: 30 mL via ORAL
  Filled 2012-05-21: qty 30

## 2012-05-21 MED ORDER — NITROGLYCERIN 0.4 MG SL SUBL
0.4000 mg | SUBLINGUAL_TABLET | SUBLINGUAL | Status: DC | PRN
  Administered 2012-05-21 (×4): 0.4 mg via SUBLINGUAL
  Filled 2012-05-21 (×2): qty 25

## 2012-05-21 MED ORDER — PANTOPRAZOLE SODIUM 40 MG IV SOLR
40.0000 mg | Freq: Every day | INTRAVENOUS | Status: DC
Start: 2012-05-21 — End: 2012-05-22
  Administered 2012-05-22: 40 mg via INTRAVENOUS
  Filled 2012-05-21: qty 40

## 2012-05-21 MED ORDER — ALTEPLASE 2 MG IJ SOLR
2.0000 mg | Freq: Once | INTRAMUSCULAR | Status: AC | PRN
  Filled 2012-05-21: qty 2

## 2012-05-21 MED ORDER — ASPIRIN 325 MG PO TABS
325.0000 mg | ORAL_TABLET | Freq: Every day | ORAL | Status: DC
  Administered 2012-05-25: 325 mg via ORAL
  Filled 2012-05-21 (×2): qty 1

## 2012-05-21 MED ORDER — SEVELAMER CARBONATE 800 MG PO TABS
1600.0000 mg | ORAL_TABLET | Freq: Three times a day (TID) | ORAL | Status: DC
  Administered 2012-05-22 – 2012-05-25 (×8): 1600 mg via ORAL
  Filled 2012-05-21 (×9): qty 2

## 2012-05-21 NOTE — Clinical Social Work Note (Signed)
Clinical Social Work Department BRIEF PSYCHOSOCIAL ASSESSMENT 05/21/2012  Patient:  TAYVIN, MEDENDORP     Account Number:  [id number]     Admit date:  05/19/2012  Clinical Social Worker:  Wyatt Haste  Date/Time:  05/21/2012 02:30 PM  Referred by:  Physician  Date Referred:  05/21/2012 Referred for  Other - See comment   Other Referral:   outpatient dialysis   Interview type:  Patient Other interview type:    PSYCHOSOCIAL DATA Living Status:  WIFE Admitted from facility:   Level of care:   Primary support name:  Dorian Pod Primary support relationship to patient:  SPOUSE Degree of support available:   supportive per pt    CURRENT CONCERNS Current Concerns  Other - See comment   Other Concerns:   outpatient dialysis    SOCIAL WORK ASSESSMENT / PLAN CSW met with pt in ICU following referral from MD for potential outpatient dialysis. Pt receiving dialysis during CSW visit but open to talk. Pt is alert and oriented and reports he lives with his wife who he describes as very supportive. 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 Sometimes he requires some assistance to transfer. Pt's wife is capable of assisting pt as needed. Pt came to ED with chest pain but was also found to be in acute renal failure. He reports he had been told a long time ago that he may need to go on dialysis and had prepared himself for that eventually. Pt going for catheter tomorrow. He reports [facility] is closest to him and would like to go to [place]. Pt has transportation and his wife is available to take him to treatments. CSW spoke with MD and Thad initiate referral today.   Assessment/plan status:  Referral to [employer] Other assessment/ plan:   Information/referral to community resources:   [place]    PATIENT'S/FAMILY'S RESPONSE TO PLAN OF CARE: Pt Thatcher need outpatient dialysis arranged and requests [facility]. CSW Rodgers follow up with pt tomorrow.        Benay Pike, [place]

## 2012-05-21 NOTE — Progress Notes (Signed)
Notified Dr. Remus Blake that pt has not had BM since 05-17-12.

## 2012-05-21 NOTE — Progress Notes (Signed)
Aware of request for IR to place tunneled HD catheter. [place] need [facility] to bring pt to [facility] tomorrow am by 0830 Hold AM heparin NPO p MN [place] see and consent pt upon arrival.

## 2012-05-21 NOTE — Clinical Documentation Improvement (Signed)
CKD DOCUMENTATION CLARIFICATION QUERY   THIS DOCUMENT IS NOT A PERMANENT PART OF THE MEDICAL RECORD  TO RESPOND TO THE THIS QUERY, FOLLOW THE INSTRUCTIONS BELOW:  1. If needed, update documentation for the patient's encounter via the notes activity.  2. Access this query again and click edit on the In [place] [place].  3. After updating, or not, click F2 to complete all highlighted (required) fields concerning your review. Select "additional documentation in the medical record" OR "no additional documentation provided".  4. Click Sign note button.  5. The deficiency Jemaine fall out of your In Basket *Please let us know if you are not able to complete this workflow by phone or e-mail (listed below).  Please update your documentation within the medical record to reflect your response to this query.                                                                                        05/21/12   Dear Dr.Anabell Swint:/Associates,  In a better effort to capture your patient's severity of illness, reflect appropriate length of stay and utilization of resources, a review of the patient medical record has revealed the following indicators.    Based on your clinical judgment, please clarify and document in a progress note and/or discharge summary the clinical condition associated with the following supporting information:  In responding to this query please exercise your independent judgment.  The fact that a query is asked, does not imply that any particular answer is desired or expected.  Possible Clinical Conditions?   CKD Stage I -  GFR > OR = 90 CKD Stage II - GFR 60-80 CKD Stage III - GFR 30-59 CKD Stage IV - GFR 15-29 CKD Stage V - GFR < 15 ESRD (End Stage Renal Disease) Other condition_____________ Cannot Clinically determine   Supporting Information:  Risk Factors:  Black male History of Diabetes, Hypertension, CAD Admitted with Acute renal failure Chronic renal  failure Dialysis   Diagnostics:  Lab:  BUN./CR/GFR 4/2 = 171/16.56/3 4/3 = 134/14.08/3  Urine: Protein:  Trace   Treatment: Foley cath placed for accurate outputs Dialysis Daily weights I&O q shift  You may use possible, probable, or suspect with inpatient documentation. possible, probable, suspected diagnoses MUST be documented at the time of discharge  Reviewed: additional documentation in the medical record   Thank You,  Debora T Williams RN, MSN Clinical Documentation Specialist: Office# [phone] [place]

## 2012-05-21 NOTE — Progress Notes (Signed)
Discuss with Dr. Lowanda Foster.  Subjective:  still nauseated. Unable to keep much down. Requiring Zofran and Phenergan. Chest pain improved but started again last night. No shortness of breath. Feels weak.  Objective: Vital signs in last 24 hours: Filed Vitals:   05/21/12 0400 05/21/12 0500 05/21/12 0600 05/21/12 0757  BP: 139/64 127/53 140/64   Pulse: 93 89 88   Temp: 97.6 F (36.4 C)   98.2 F (36.8 C)  TempSrc: Oral   Oral  Resp: 18 14 12    Height:      Weight:  87 kg (191 lb 12.8 oz)    SpO2: 97% 96% 96%    Weight change: 0.816 kg (1 lb 12.8 oz)  Intake/Output Summary (Last 24 hours) at 05/21/12 0901 Last data filed at 05/21/12 0600  Gross per 24 hour  Intake 3037.5 ml  Output   1675 ml  Net 1362.5 ml   General: Alert. Oriented. Emesis basin at his side with clear emesis. Uncomfortable Lungs clear to auscultation bilaterally without wheeze rhonchi or rales Cardiovascular regular rate rhythm without murmurs gallops rubs Abdomen soft nontender nondistended Extremities: Left BKA. Right leg without edema. Skin/musculoskeletal: Left chest tenderness to palpation. No erythema, fluctuance, induration. Large healed surgical scar over the chest centrally. Left wrist with cystic structure. Lipoma noted on the posterior neck.  Lab Results: Basic Metabolic Panel:  Recent Labs Lab 05/20/12 0527 05/20/12 0941 05/20/12 1129 05/21/12 0446  NA 138  --  138 140  K 6.0*  --  5.6* 4.9  CL 100  --  101 102  CO2 16*  --  16* 18*  GLUCOSE 148*  --  222* 134*  BUN 175*  --  171* 134*  CREATININE 16.75*  --  16.56* 14.08*  CALCIUM 8.7  --  8.3* 8.2*  MG  --  2.8*  --   --   PHOS  --  8.3*  --   --    Liver Function Tests:  Recent Labs Lab 05/19/12 1635 05/20/12 0527 05/20/12 1530  AST 15 17  --   ALT 15 19 19   ALKPHOS 69 64  --   BILITOT 0.2* 0.1*  --   PROT 7.3 6.8  --   ALBUMIN 3.0* 2.7*  --     Recent Labs Lab 05/19/12 1635  LIPASE 51   No results found for this  basename: AMMONIA,  in the last 168 hours CBC:  Recent Labs Lab 05/19/12 1635 05/20/12 0527  WBC 9.1 8.4  NEUTROABS 7.7  --   HGB 11.7* 11.1*  HCT 33.8* 32.5*  MCV 83.9 84.0  PLT 256 253   Cardiac Enzymes:  Recent Labs Lab 05/19/12 1635 05/20/12 1129  CKTOTAL  --  91  TROPONINI <0.30  --    BNP: No results found for this basename: PROBNP,  in the last 168 hours D-Dimer: No results found for this basename: DDIMER,  in the last 168 hours CBG:  Recent Labs Lab 05/19/12 2137 05/20/12 0733 05/20/12 1133 05/20/12 1625 05/20/12 2135 05/21/12 0735  GLUCAP 155* 139* 208* 138* 102* 138*   Hemoglobin A1C:  Recent Labs Lab 05/19/12 2126  HGBA1C 8.2*   Fasting Lipid Panel: No results found for this basename: CHOL, HDL, LDLCALC, TRIG, CHOLHDL, LDLDIRECT,  in the last 168 hours Thyroid Function Tests:  Recent Labs Lab 05/19/12 2126  TSH 0.733   Coagulation: No results found for this basename: LABPROT, INR,  in the last 168 hours Anemia Panel:  Recent Labs Lab  05/20/12 0527  VITAMINB12 1426*  FOLATE >20.0  FERRITIN 380*  TIBC 224  IRON 137*  RETICCTPCT 2.8   Urine Drug Screen: Drugs of Abuse  No results found for this basename: labopia,  cocainscrnur,  labbenz,  amphetmu,  thcu,  labbarb    Alcohol Level: No results found for this basename: ETH,  in the last 168 hours Urinalysis:  Recent Labs Lab 05/19/12 1656  COLORURINE YELLOW  LABSPEC 1.015  PHURINE 5.5  Morgan TRACE*  UROBILINOGEN 0.2  NITRITE NEGATIVE  LEUKOCYTESUR NEGATIVE   Micro Results: Recent Results (from the past 240 hour(s))  URINE CULTURE     Status: None   Collection Time    05/19/12  4:56 PM      Result Value Range Status   Specimen Description URINE, CLEAN CATCH   Final   Special Requests NONE   Final   Culture  Setup Time 05/19/2012 18:52   Final   Colony Count NO GROWTH   Final    Culture NO GROWTH   Final   Report Status 05/20/2012 FINAL   Final  MRSA PCR SCREENING     Status: None   Collection Time    05/19/12 10:41 PM      Result Value Range Status   MRSA by PCR NEGATIVE  NEGATIVE Final   Comment:            The GeneXpert MRSA Assay (FDA     approved for NASAL specimens     only), is one component of a     comprehensive MRSA colonization     surveillance program. It is not     intended to diagnose MRSA     infection nor to guide or     [name] treatment for     MRSA infections.   Studies/Results: Ct Abdomen Pelvis Wo Contrast  05/19/2012  *RADIOLOGY REPORT*  Clinical Data: Left-sided chest pain  CT ABDOMEN AND PELVIS WITHOUT CONTRAST  Technique:  Multidetector CT imaging of the abdomen and pelvis was performed following the standard protocol without intravenous contrast.  Comparison: 10/28/2011  Findings: Linear atelectasis or scar at the left base.  No hydronephrosis.  No definite ureteral calculus.  No definite renal calculus.  Perinephric stranding is symmetrical and unchanged.  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  Calcified granulomata in the liver.  No mass.  Post cholecystectomy.  Spleen, pancreas, adrenal glands are within normal limits.  No disproportionate dilatation of bowel.  Atherosclerotic calcifications of the aorta, visceral vasculature, and iliac vessels are noted.  Appendix is not visualized.  Stable appearance of the bladder and prostate with prostate enlargement.  No free fluid.  Advanced degenerative changes within the lumbar spine.  Vacuum disc is noted at multiple levels.  No definite wedge compression deformity in the lumbar spine.  There is a left-sided wedge compression deformity of T8 which has a chronic appearance.  There is severe deformity of the right femoral head with cystic changes and collapse of the articular surface.  There is also severe irregularity of the articular surface of the acetabulum and humeral head.  Cystic  changes are noted.  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  No evidence of urinary obstruction.  There is deformity of the right femoral head and hip joint as described.  Original Report Authenticated By: Marybelle Killings, M.D.    Dg Chest 2 View  05/19/2012  *RADIOLOGY REPORT*  Clinical Data: Cough, possible infiltrate  CHEST - 2 VIEW  Comparison: 10/28/2011  Findings: Cardiomediastinal silhouette is stable.  Elevation of the left hemidiaphragm.  No acute infiltrate or pulmonary edema. Dextroscoliosis lower thoracic spine.  IMPRESSION: No active disease.  Elevation of the left hemidiaphragm. Dextroscoliosis of the lower thoracic spine.   Original Report Authenticated By: Lahoma Crocker, M.D.    Scheduled Meds: . aspirin EC  81 mg Oral q morning - 10a  . fluticasone  2 spray Each Nare Daily  . furosemide  200 mg Intravenous BID  . heparin  5,000 Units Subcutaneous Q8H  . hydrALAZINE  10 mg Intravenous Q6H  . insulin aspart  0-9 Units Subcutaneous TID WC  . sevelamer carbonate  1,600 mg Oral TID WC  . simvastatin  40 mg Oral q1800  . sodium chloride  3 mL Intravenous Q12H  . tuberculin  5 Units Intradermal Once   Continuous Infusions: . sodium chloride 100 mL/hr at 05/21/12 0600   PRN Meds:.sodium chloride, sodium chloride, acetaminophen, acetaminophen, albuterol, feeding supplement (NEPRO CARB STEADY), heparin, HYDROmorphone (DILAUDID) injection, lidocaine (PF), lidocaine-prilocaine, ondansetron (ZOFRAN) IV, ondansetron, oxyCODONE, pentafluoroprop-tetrafluoroeth, promethazine Assessment/Plan: Principal Problem:   Acute renal failure: Patient Alan [name] get dialysis again today. Still having uremic symptoms. Alan [name] likely require outpatient dialysis. Alan [name] notify Education officer, museum. Alan [name] urine output, and possibly require a tunneled  dialysis catheter. Deferred to nephrology.     Hyperkalemia: Resolved. Transfer out of the step down unit. Telemetry for now to   Nausea vomiting: likely secondary to uremia. We'll get dialysis again today. Continue Zofran and Phenergan    DM type 2 causing vascular disease: Continue sliding scale    HTN (hypertension), benign, better controlled. IV hydralazine until able to take anything by mouth.    CAD (coronary artery disease): Stable    Musculoskeletal chest pain: No NSAIDs due to renal failure and hyperkalemia    S/P BKA (below knee amputation) unilateral, left   LOS: 2 days   Alan [name] 05/21/2012, 9:01 AM

## 2012-05-21 NOTE — Progress Notes (Signed)
Dr. Lowanda Foster  made  aware having to run reversed, red port with no blood return, but flushes easily, unable to aspirate out dwelling heparin. And also made aware [place] only run at 275 ml/hr. Also made aware on going nausea, had vomiting this am and of meds ordered and doses, and neither able to be given now. He asked to speak with ICU RN.

## 2012-05-21 NOTE — Progress Notes (Signed)
Subjective: Interval History: has complaints nausea and vomiting. He denies any difficulty breathing. He says that she has occasional chest pain . He feels a slightly better but not much. Still complains of some weakness.  Objective: Vital signs in last 24 hours: Temp:  [97.4 F (36.3 C)-98.2 F (36.8 C)] 98.2 F (36.8 C) (04/03 0757) Pulse Rate:  [76-93] 88 (04/03 0600) Resp:  [9-21] 12 (04/03 0600) BP: (116-166)/(52-119) 140/64 mmHg (04/03 0600) SpO2:  [94 %-98 %] 96 % (04/03 0600) Weight:  [87 kg (191 lb 12.8 oz)] 87 kg (191 lb 12.8 oz) (04/03 0500) Weight change: 0.816 kg (1 lb 12.8 oz)  Intake/Output from previous day: 04/02 0701 - 04/03 0700 In: 3037.5 [I.V.:2927.5; IV Piggyback:70] Out: 1675 [Urine:575] Intake/Output this shift:    General appearance: alert, cooperative and no distress Resp: clear to auscultation bilaterally Cardio: regular rate and rhythm, S1, S2 normal, no murmur, click, rub or gallop GI: soft, non-tender; bowel sounds normal; no masses,  no organomegaly Extremities: extremities normal, atraumatic, no cyanosis or edema  Lab Results:  Recent Labs  05/19/12 1635 05/20/12 0527  WBC 9.1 8.4  HGB 11.7* 11.1*  HCT 33.8* 32.5*  PLT 256 253   BMET:  Recent Labs  05/20/12 1129 05/21/12 0446  NA 138 140  K 5.6* 4.9  CL 101 102  CO2 16* 18*  GLUCOSE 222* 134*  BUN 171* 134*  CREATININE 16.56* 14.08*  CALCIUM 8.3* 8.2*   No results found for this basename: PTH,  in the last 72 hours Iron Studies:  Recent Labs  05/20/12 0527  IRON 137*  TIBC 224  FERRITIN 380*    Studies/Results: Ct Abdomen Pelvis Wo Contrast  05/19/2012  *RADIOLOGY REPORT*  Clinical Data: Left-sided chest pain  CT ABDOMEN AND PELVIS WITHOUT CONTRAST  Technique:  Multidetector CT imaging of the abdomen and pelvis was performed following the standard protocol without intravenous contrast.  Comparison: 10/28/2011  Findings: Linear atelectasis or scar at the left base.  No  hydronephrosis.  No definite ureteral calculus.  No definite renal calculus.  Perinephric stranding is symmetrical and unchanged.  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  Calcified granulomata in the liver.  No mass.  Post cholecystectomy.  Spleen, pancreas, adrenal glands are within normal limits.  No disproportionate dilatation of bowel.  Atherosclerotic calcifications of the aorta, visceral vasculature, and iliac vessels are noted.  Appendix is not visualized.  Stable appearance of the bladder and prostate with prostate enlargement.  No free fluid.  Advanced degenerative changes within the lumbar spine.  Vacuum disc is noted at multiple levels.  No definite wedge compression deformity in the lumbar spine.  There is a left-sided wedge compression deformity of T8 which has a chronic appearance.  There is severe deformity of the right femoral head with cystic changes and collapse of the articular surface.  There is also severe irregularity of the articular surface of the acetabulum and humeral head.  Cystic changes are noted.  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  No evidence of urinary obstruction.  There is deformity of the right femoral head and hip joint as described.   Original Report Authenticated By: Marybelle Killings, M.D.    Dg Chest 2 View  05/19/2012  *RADIOLOGY REPORT*  Clinical Data: Cough, possible infiltrate  CHEST - 2 VIEW  Comparison: 10/28/2011  Findings: Cardiomediastinal silhouette  is stable.  Elevation of the left hemidiaphragm.  No acute infiltrate or pulmonary edema. Dextroscoliosis lower thoracic spine.  IMPRESSION: No active disease.  Elevation of the left hemidiaphragm. Dextroscoliosis of the lower thoracic spine.   Original Report Authenticated By: Lahoma Crocker, M.D.     I have reviewed the patient's current  medications.  Assessment/Plan: Problem #1 her renal failure at this moment probably chronic. Patient has uremic sign and symptoms. She status post short dialysis yesterday and there is no significant improvement.  Problem #2 hyperkalemia potassium is corrected Problem #3 hypertension his blood pressure seems to be reasonably controlled Problem #4 history of diabetes Problem #5 coronary artery disease presently has some occasional chest pain. There is no radiation. Normal CPK. Problem #6 metabolic bone disease calcium was in acceptable range however phosphorus very high. Problem #7 peripheral vascular disease status post left BKA. Plan: We'll make arrangements for patient to get dialysis today for 4 hours. Broc the start patient on Renvella 800 mg 2 tablets by mouth 3 times a day with meals and one with snack. We'll make arrangements for patient to get her split catheter possibly tomorrow.   LOS: 2 days   Maham Quintin S 05/21/2012,8:01 AM

## 2012-05-21 NOTE — Progress Notes (Addendum)
Ran x 4 hours, able to get pump up to 300 ml/min about midway through run. Net UF 1800 ML. Report to [facility] RN. [place]. Ward RN made aware of chest pain, see charting. Clista Bernhardt RN

## 2012-05-21 NOTE — Progress Notes (Signed)
Report called to Donavan Foil,  RN. Pt transported via bed to room 303 with personal belongings and prosthesis. Family at bedside and aware of room chg.

## 2012-05-22 ENCOUNTER — Ambulatory Visit (HOSPITAL_COMMUNITY)
Admit: 2012-05-22 | Discharge: 2012-05-22 | Disposition: A | Discharge status: Home / Self Care | Payer: Medicare Other | Attending: Nephrology | Admitting: Nephrology

## 2012-05-22 ENCOUNTER — Ambulatory Visit (HOSPITAL_COMMUNITY): Payer: Medicare Other

## 2012-05-22 DIAGNOSIS — R112 Nausea with vomiting, unspecified: Secondary | ICD-10-CM | POA: Diagnosis present

## 2012-05-22 DIAGNOSIS — N179 Acute kidney failure, unspecified: Secondary | ICD-10-CM

## 2012-05-22 DIAGNOSIS — I2510 Atherosclerotic heart disease of native coronary artery without angina pectoris: Secondary | ICD-10-CM

## 2012-05-22 LAB — GLUCOSE, CAPILLARY
Glucose-Capillary: 179 mg/dL — ABNORMAL HIGH (ref 70–99)
Glucose-Capillary: 177 mg/dL — ABNORMAL HIGH (ref 70–99)
Glucose-Capillary: 172 mg/dL — ABNORMAL HIGH (ref 70–99)

## 2012-05-22 LAB — BASIC METABOLIC PANEL
BUN: 88 mg/dL — ABNORMAL HIGH (ref 6–23)
CO2: 19 mEq/L (ref 19–32)
Calcium: 8.7 mg/dL (ref 8.4–10.5)
Chloride: 99 mEq/L (ref 96–112)
Creatinine, Ser: 9.99 mg/dL — ABNORMAL HIGH (ref 0.50–1.35)

## 2012-05-22 LAB — CBC
HCT: 30.4 % — ABNORMAL LOW (ref 39.0–52.0)
MCHC: 33.2 g/dL (ref 30.0–36.0)
MCV: 85.9 fL (ref 78.0–100.0)
Platelets: 196 10*3/uL (ref 150–400)
RDW: 14.7 % (ref 11.5–15.5)

## 2012-05-22 LAB — C3 COMPLEMENT: C3 Complement: 99 mg/dL (ref 90–180)

## 2012-05-22 LAB — C4 COMPLEMENT: Complement C4, Body Fluid: 30 mg/dL (ref 10–40)

## 2012-05-22 LAB — ANTISTREPTOLYSIN O TITER: ASO: <25 IU/mL (ref <409)

## 2012-05-22 MED ORDER — FENTANYL CITRATE 0.05 MG/ML IJ SOLN
INTRAMUSCULAR | Status: AC | PRN
  Administered 2012-05-22: 50 ug via INTRAVENOUS

## 2012-05-22 MED ORDER — HYDRALAZINE HCL 20 MG/ML IJ SOLN
10.0000 mg | Freq: Four times a day (QID) | INTRAMUSCULAR | Status: DC | PRN
  Filled 2012-05-22: qty 0.5

## 2012-05-22 MED ORDER — SODIUM CHLORIDE 0.9 % IV SOLN: 100.0000 mL | INTRAVENOUS | Status: DC | PRN

## 2012-05-22 MED ORDER — DOXERCALCIFEROL 4 MCG/2ML IV SOLN
4.0000 ug | INTRAVENOUS | Status: DC
  Administered 2012-05-23: 4 ug via INTRAVENOUS
  Filled 2012-05-22 (×2): qty 2

## 2012-05-22 MED ORDER — CEFAZOLIN SODIUM-DEXTROSE 2-3 GM-% IV SOLR
2.0000 g | INTRAVENOUS | Status: AC
  Administered 2012-05-22: 2 g via INTRAVENOUS
  Filled 2012-05-22: qty 50

## 2012-05-22 MED ORDER — HYDROMORPHONE HCL PF 1 MG/ML IJ SOLN
1.0000 mg | INTRAMUSCULAR | Status: DC | PRN
  Administered 2012-05-23: 1 mg via INTRAVENOUS
  Filled 2012-05-22 (×2): qty 1

## 2012-05-22 MED ORDER — ONDANSETRON HCL 4 MG/2ML IJ SOLN
4.0000 mg | Freq: Three times a day (TID) | INTRAMUSCULAR | Status: DC
  Administered 2012-05-22 – 2012-05-25 (×9): 4 mg via INTRAVENOUS
  Filled 2012-05-22 (×9): qty 2

## 2012-05-22 MED ORDER — SUCRALFATE 1 GM/10ML PO SUSP
1.0000 g | Freq: Three times a day (TID) | ORAL | Status: DC | PRN
  Filled 2012-05-22 (×3): qty 10

## 2012-05-22 MED ORDER — SUCRALFATE 1 GM/10ML PO SUSP: 1.0000 g | Freq: Three times a day (TID) | ORAL | Status: DC

## 2012-05-22 MED ORDER — PANTOPRAZOLE SODIUM 40 MG PO TBEC
40.0000 mg | DELAYED_RELEASE_TABLET | Freq: Two times a day (BID) | ORAL | Status: DC
  Administered 2012-05-22 – 2012-05-25 (×6): 40 mg via ORAL
  Filled 2012-05-22 (×6): qty 1

## 2012-05-22 MED ORDER — MIDAZOLAM HCL 2 MG/2ML IJ SOLN
INTRAMUSCULAR | Status: AC | PRN
  Administered 2012-05-22: 1 mg via INTRAVENOUS

## 2012-05-22 MED ORDER — ONDANSETRON HCL 4 MG/2ML IJ SOLN
4.0000 mg | Freq: Four times a day (QID) | INTRAMUSCULAR | Status: DC | PRN
  Administered 2012-05-24: 4 mg via INTRAVENOUS
  Filled 2012-05-22 (×2): qty 2

## 2012-05-22 MED ORDER — ALTEPLASE 2 MG IJ SOLR
2.0000 mg | Freq: Once | INTRAMUSCULAR | Status: AC | PRN
  Filled 2012-05-22: qty 2

## 2012-05-22 MED ORDER — PANTOPRAZOLE SODIUM 40 MG IV SOLR: 40.0000 mg | Freq: Two times a day (BID) | INTRAVENOUS | Status: DC

## 2012-05-22 NOTE — Progress Notes (Signed)
Subjective: Still vomiting. Unable to keep anything down. He had a severe episode of substernal chest pain last night, unrelieved by nitroglycerin. Pain has started to ease up. No dyspnea. Reports having had a history of ulcers. Denies esophageal dysphasia. Reports having had an EGD and colonoscopy many years ago possibly in [place].  Objective: Vital signs in last 24 hours: Filed Vitals:   05/22/12 1007 05/22/12 1010 05/22/12 1015 05/22/12 1035  BP: 119/56 107/50 113/44 118/51  Pulse: 92 93 93 95  Temp:      TempSrc:      Resp: 12 9 17 13   Height:      Weight:      SpO2: 100% 97% 99% 97%   Weight change: -2.177 kg (-4 lb 12.8 oz)  Intake/Output Summary (Last 24 hours) at 05/22/12 1423 Last data filed at 05/22/12 1200  Gross per 24 hour  Intake    200 ml  Output   2400 ml  Net  -2200 ml   General: Asleep. Arousable. Emesis basin at his side with clear emesis.  Lungs clear to auscultation bilaterally without wheeze rhonchi or rales Cardiovascular regular rate rhythm without murmurs gallops rubs Abdomen soft mild epigastric tenderness nondistended Extremities: Left BKA. Right leg without edema. Skin/musculoskeletal: Left chest less tender to palpation. No erythema, fluctuance, induration. Large healed surgical scar over the chest centrally.   Lab Results: Basic Metabolic Panel:  Recent Labs Lab 05/20/12 0527 05/20/12 0941  05/21/12 0446 05/22/12 0519  NA 138  --   < > 140 141  K 6.0*  --   < > 4.9 4.2  CL 100  --   < > 102 99  CO2 16*  --   < > 18* 19  GLUCOSE 148*  --   < > 134* 181*  BUN 175*  --   < > 134* 88*  CREATININE 16.75*  --   < > 14.08* 9.99*  CALCIUM 8.7  --   < > 8.2* 8.7  MG  --  2.8*  --   --   --   PHOS  --  8.3*  --   --  7.9*  < > = values in this interval not displayed. Liver Function Tests:  Recent Labs Lab 05/19/12 1635 05/20/12 0527 05/20/12 1530  AST 15 17  --   ALT 15 19 19   ALKPHOS 69 64  --   BILITOT 0.2* 0.1*  --    PROT 7.3 6.8  --   ALBUMIN 3.0* 2.7*  --     Recent Labs Lab 05/19/12 1635  LIPASE 51   No results found for this basename: AMMONIA,  in the last 168 hours CBC:  Recent Labs Lab 05/19/12 1635 05/20/12 0527 05/22/12 0519  WBC 9.1 8.4 10.1  NEUTROABS 7.7  --   --   HGB 11.7* 11.1* 10.1*  HCT 33.8* 32.5* 30.4*  MCV 83.9 84.0 85.9  PLT 256 253 196   Cardiac Enzymes:  Recent Labs Lab 05/19/12 1635 05/20/12 1129 05/21/12 2332  CKTOTAL  --  91  --   TROPONINI <0.30  --  <0.30   BNP: No results found for this basename: PROBNP,  in the last 168 hours D-Dimer: No results found for this basename: DDIMER,  in the last 168 hours CBG:  Recent Labs Lab 05/21/12 0735 05/21/12 1143 05/21/12 1715 05/21/12 2115 05/22/12 0735 05/22/12 1148  GLUCAP 138* 137* 95 138* 179* 172*   Hemoglobin A1C:  Recent Labs Lab 05/19/12  2126  HGBA1C 8.2*   Fasting Lipid Panel: No results found for this basename: CHOL, HDL, LDLCALC, TRIG, CHOLHDL, LDLDIRECT,  in the last 168 hours Thyroid Function Tests:  Recent Labs Lab 05/19/12 2126  TSH 0.733   Coagulation:  Recent Labs Lab 05/22/12 0519  LABPROT 14.2  INR 1.11   Anemia Panel:  Recent Labs Lab 05/20/12 0527  VITAMINB12 1426*  FOLATE >20.0  FERRITIN 380*  TIBC 224  IRON 137*  RETICCTPCT 2.8   Urine Drug Screen: Drugs of Abuse  No results found for this basename: labopia,  cocainscrnur,  labbenz,  amphetmu,  thcu,  labbarb    Alcohol Level: No results found for this basename: ETH,  in the last 168 hours Urinalysis:  Recent Labs Lab 05/19/12 1656  COLORURINE YELLOW  LABSPEC 1.015  PHURINE 5.5  [place] TRACE*  UROBILINOGEN 0.2  NITRITE NEGATIVE  LEUKOCYTESUR NEGATIVE   Micro Results: Recent Results (from the past 240 hour(s))  URINE CULTURE     Status: None   Collection Time    05/19/12  4:56 PM      Result Value  Range Status   Specimen Description URINE, CLEAN CATCH   Final   Special Requests NONE   Final   Culture  Setup Time 05/19/2012 18:52   Final   Colony Count NO GROWTH   Final   Culture NO GROWTH   Final   Report Status 05/20/2012 FINAL   Final  MRSA PCR SCREENING     Status: None   Collection Time    05/19/12 10:41 PM      Result Value Range Status   MRSA by PCR NEGATIVE  NEGATIVE Final   Comment:            The GeneXpert MRSA Assay (FDA     approved for NASAL specimens     only), is one component of a     comprehensive MRSA colonization     surveillance program. It is not     intended to diagnose MRSA     infection nor to guide or     monitor treatment for     MRSA infections.   Studies/Results: No results found. Scheduled Meds: . aspirin  325 mg Oral Daily  . [START ON 05/23/2012] doxercalciferol  4 mcg Intravenous Q T,Th,Sa-HD  . fluticasone  2 spray Each Nare Daily  . furosemide  200 mg Intravenous BID  . heparin  5,000 Units Subcutaneous Q8H  . hydrALAZINE  10 mg Intravenous Q6H  . insulin aspart  0-9 Units Subcutaneous TID WC  . pantoprazole (PROTONIX) IV  40 mg Intravenous Q12H  . sevelamer carbonate  1,600 mg Oral TID WC  . sodium chloride  3 mL Intravenous Q12H  . sucralfate  1 g Oral TID WC & HS  . tuberculin  5 Units Intradermal Once   Continuous Infusions:   PRN Meds:.sodium chloride, sodium chloride, sodium chloride, sodium chloride, sodium chloride, sodium chloride, acetaminophen, acetaminophen, albuterol, alteplase, bisacodyl, feeding supplement (NEPRO CARB STEADY), heparin, HYDROmorphone (DILAUDID) injection, lidocaine (PF), lidocaine-prilocaine, nitroGLYCERIN, ondansetron (ZOFRAN) IV, ondansetron, pentafluoroprop-tetrafluoroeth, promethazine Assessment/Plan: Principal Problem:   Acute renal failure: Status post permacath placement. Keno get dialysis tomorrow. Outpatient dialysis being arranged.  Chest pain: Initial pain was left-sided and clearly  musculoskeletal. The pain he had last night was more centrally located. Did not improve with nitroglycerin. EKG and troponin normal. Patient has ongoing vomiting. I suspect  he may have GI related chest pain currently. Patient was started on proton X. which I Matei change to twice daily. Add Carafate. LFTs and lipase normal. She reports a history of ulcers previously but details are sketchy. Tameem consult GI to consider EGD. Patient has been getting Phenergan and Zofran nearly around-the-clock but Lott schedule Zofran, and continue when necessary Phenergan.  Nausea vomiting: Continues despite several courses of hemodialysis. See above. Need to consider gastroparesis.    DM type 2 causing vascular disease: Continue sliding scale    HTN (hypertension), benign, better controlled. IV hydralazine until able to take anything by mouth.    CAD (coronary artery disease): Stable    Musculoskeletal chest pain: No NSAIDs due to renal failure and hyperkalemia    S/P BKA (below knee amputation) unilateral, left   LOS: 3 days   Caleb Prigmore L 05/22/2012, 2:23 PM

## 2012-05-22 NOTE — Progress Notes (Addendum)
Patient complained of chest pain. Nitro did not improve. Dr. Nancy Nordmann notified and orders were followed at 2312. Patient later awoke at 0340 and vomited. Zofran was given and vomiting was relieved.

## 2012-05-22 NOTE — Consult Note (Signed)
Referring Provider: No ref. provider found Primary Care Physician:  Marval Regal, MD Primary Gastroenterologist:  Barney Drain  Reason for Consultation:  VOMITING  HPI:  PT'S BEEN DIABETIC FOR > 30 YEARS. HAVING WORSENING RENAL FUNCTION AND STARTED HD 1 WEEK AGO. RENAL FAILURE ASSOCIATED WITH ONSET OF NAUSEA/VOMITING. C/O MILD PERIUMBILICAL ABD PAIN & PROBLEMS SWALLOWING. C/O OF HEARTBURN. PT DENIES FEVER, CHILLS, BRBPR, melena, diarrhea, constipation. REMOTE EGD/TCS IN [place], [place].   Past Medical History  Diagnosis Date  . Diabetes mellitus   . Hypertension   . Gout   . Coronary artery disease   . Stroke   . Asthma   . Renal insufficiency   . Hypercholesteremia   . Arthritis   . DDD (degenerative disc disease), lumbar   . DDD (degenerative disc disease), cervical     Past Surgical History  Procedure Laterality Date  . Coronary artery bypass graft    . Below knee leg amputation Left     Prior to Admission medications   Medication Sig Start Date End Date Taking? Authorizing Provider  Ascorbic Acid (VITAMIN C PO) Take 1 tablet by mouth every morning.    Yes Historical Provider, MD  aspirin EC 81 MG tablet Take 81 mg by mouth every morning.   Yes Historical Provider, MD  Coenzyme Q10 (CO Q 10 PO) Take 1 tablet by mouth daily.   Yes Historical Provider, MD  enalapril (VASOTEC) 5 MG tablet Take 5 mg by mouth daily.   Yes Historical Provider, MD  fluticasone (FLONASE) 50 MCG/ACT nasal spray Place 2 sprays into the nose daily.   Yes Historical Provider, MD  furosemide (LASIX) 40 MG tablet Take 40 mg by mouth 2 (two) times daily.    Yes Historical Provider, MD  insulin NPH (HUMULIN N,NOVOLIN N) 100 UNIT/ML injection Inject 10 Units into the skin at bedtime. Use according to sliding scale.   Yes Historical Provider, MD  pravastatin (PRAVACHOL) 80 MG tablet Take 80 mg by mouth every evening.    Yes Historical Provider, MD  Multiple Vitamins-Minerals (MULTIVITAMINS THER.  W/MINERALS) TABS Take 1 tablet by mouth daily.      Historical Provider, MD  nitroGLYCERIN (NITROSTAT) 0.4 MG SL tablet Place 0.4 mg under the tongue every 5 (five) minutes as needed.    Historical Provider, MD    Current Facility-Administered Medications  Medication Dose Route Frequency Provider Last Rate Last Dose  . 0.9 %  sodium chloride infusion  100 mL Intravenous PRN Harriett Sine, MD      . 0.9 %  sodium chloride infusion  100 mL Intravenous PRN Harriett Sine, MD      . 0.9 %  sodium chloride infusion  100 mL Intravenous PRN Harriett Sine, MD      . 0.9 %  sodium chloride infusion  100 mL Intravenous PRN Harriett Sine, MD      . 0.9 %  sodium chloride infusion  100 mL Intravenous PRN Harriett Sine, MD      . 0.9 %  sodium chloride infusion  100 mL Intravenous PRN Harriett Sine, MD      . acetaminophen (TYLENOL) tablet 650 mg  650 mg Oral Q6H PRN Bonnielee Haff, MD       Or  . acetaminophen (TYLENOL) suppository 650 mg  650 mg Rectal Q6H PRN Bonnielee Haff, MD      . albuterol (PROVENTIL) (5 MG/ML) 0.5% nebulizer solution 2.5 mg  2.5 mg Nebulization Q2H PRN Bonnielee Haff,  MD      . alteplase (CATHFLO ACTIVASE) injection 2 mg  2 mg Intracatheter Once PRN Harriett Sine, MD      . aspirin tablet 325 mg  325 mg Oral Daily Acquanetta Chain, DO      . bisacodyl (DULCOLAX) suppository 10 mg  10 mg Rectal Daily PRN Delfina Redwood, MD   10 mg at 05/21/12 1718  . [START ON 05/23/2012] doxercalciferol (HECTOROL) injection 4 mcg  4 mcg Intravenous Q T,Th,Sa-HD Harriett Sine, MD      . feeding supplement (NEPRO CARB STEADY) liquid 237 mL  237 mL Oral PRN Harriett Sine, MD      . fluticasone (FLONASE) 50 MCG/ACT nasal spray 2 spray  2 spray Each Nare Daily Bonnielee Haff, MD   2 spray at 05/21/12 1100  . furosemide (LASIX) 200 mg in dextrose 5 % 50 mL IVPB  200 mg Intravenous BID Harriett Sine, MD   200 mg at 05/21/12 1719  . heparin  injection 1,000 Units  1,000 Units Dialysis PRN Harriett Sine, MD      . heparin injection 5,000 Units  5,000 Units Subcutaneous Q8H Bonnielee Haff, MD   5,000 Units at 05/22/12 1450  . hydrALAZINE (APRESOLINE) injection 10 mg  10 mg Intravenous Q6H PRN Delfina Redwood, MD      . HYDROmorphone (DILAUDID) injection 1 mg  1 mg Intravenous Q2H PRN Delfina Redwood, MD      . insulin aspart (novoLOG) injection 0-9 Units  0-9 Units Subcutaneous TID WC Bonnielee Haff, MD   2 Units at 05/22/12 1254  . lidocaine (PF) (XYLOCAINE) 1 % injection 5 mL  5 mL Intradermal PRN Harriett Sine, MD      . lidocaine-prilocaine (EMLA) cream 1 application  1 application Topical PRN Harriett Sine, MD      . nitroGLYCERIN (NITROSTAT) SL tablet 0.4 mg  0.4 mg Sublingual Q5 min PRN Delfina Redwood, MD   0.4 mg at 05/21/12 2312  . ondansetron (ZOFRAN) injection 4 mg  4 mg Intravenous Q6H PRN Delfina Redwood, MD      . ondansetron [facility]) tablet 4 mg  4 mg Oral Q4H PRN Delfina Redwood, MD      . pantoprazole (PROTONIX) EC tablet 40 mg  40 mg Oral BID AC Sandi L Fields, MD      . pentafluoroprop-tetrafluoroeth (GEBAUERS) aerosol 1 application  1 application Topical PRN Harriett Sine, MD      . promethazine (PHENERGAN) injection 6.25 mg  6.25 mg Intravenous Q6H PRN Delfina Redwood, MD   6.25 mg at 05/22/12 1339  . sevelamer carbonate (RENVELA) tablet 1,600 mg  1,600 mg Oral TID WC Harriett Sine, MD   1,600 mg at 05/22/12 1255  . sodium chloride 0.9 % injection 3 mL  3 mL Intravenous Q12H Bonnielee Haff, MD   3 mL at 05/21/12 2347  . sucralfate (CARAFATE) 1 GM/10ML suspension 1 g  1 g Oral TID WC PRN Danie Binder, MD      . tuberculin injection 5 Units  5 Units Intradermal Once Harriett Sine, MD        Allergies as of 05/19/2012 - Review Complete 05/19/2012  Allergen Reaction Noted  . Codeine Itching and Nausea And Vomiting 07/20/2010  . Yellow jacket venom (bee venom)  Swelling 10/28/2011    Family History:  Colon Cancer  negative  Polyps  negative   History   Social History  . Marital Status: Married    Spouse Name: N/A    Number of Children: N/A  . Years of Education: N/A   Social History Main Topics  . Smoking status: Never Smoker   . Smokeless tobacco: Not on file  . Alcohol Use: No  . Drug Use: No  . Sexually Active: Not Currently   Review of Systems: PER HPI OTHERWISE ALL SYSTEMS ARE NEGATIVE.  Vitals: Blood pressure 118/51, pulse 95, temperature 98.2 F (36.8 C), temperature source Oral, resp. rate 13, height 5\' 9"  (1.753 m), weight 187 lb (84.823 kg), SpO2 97.00%.  Physical Exam: General:   Alert,  Well-developed, well-nourished, pleasant and cooperative in NAD Head:  Normocephalic and atraumatic. Eyes:  Sclera clear, no icterus.   Conjunctiva pink. Mouth:  No deformity or lesions, dentition ABnormal. Neck:  Supple; no masses  CHEST: TUNNELED CATHETER IN RIGHT CHEST Lungs:  Clear throughout to auscultation.   No wheezes. No acute distress. Heart:  Regular rate and rhythm; no murmurs Abdomen:  Soft, MILD PERI-UMBILICAL TTP, nondistended. No massesnoted. Normal bowel sounds, without guarding, and without rebound.   Msk:  LEFT BKA. BARREL CHEST Extremities:  RLE Without edema. Neurologic:  Alert and  oriented x4;  grossly normal neurologically. Cervical Nodes:  No significant cervical adenopathy. Psych:  Alert and cooperative. Normal mood and affect.  Lab Results:  Recent Labs  05/19/12 1635 05/20/12 0527 05/22/12 0519  WBC 9.1 8.4 10.1  HGB 11.7* 11.1* 10.1*  HCT 33.8* 32.5* 30.4*  PLT 256 253 196   BMET  Recent Labs  05/21/12 0446 05/22/12 0519  NA 140 141  K 4.9 4.2  CL 102 99  CO2 18* 19  GLUCOSE 134* 181*  BUN 134* 88*  CREATININE 14.08* 9.99*  CALCIUM 8.2* 8.7   LFT  Recent Labs  05/20/12 0527 05/20/12 1530  PROT 6.8  --   ALBUMIN 2.7*  --   AST 17  --   ALT 19 19   ALKPHOS 64  --   BILITOT 0.1*  --      Studies/Results: MAR 2014: CT A/P W/O IVC-NAIAP  Impression: NAUSEA/VOMITING AND ABD PAIN MOST LIKELY DUE TO UREMIA, LESS LIKELY GASTROPARESIS, H PYLORI GASTRITIS, GOO, OR REFLUX ESOPHAGITIS. DYSPHAGIA MOST LIKELY DUE TO REFLUX ESOPHAGITIS, LESS LIKELY STRICTURE/WEB/ESOPHAGEAL CANCER  Plan: 1. FULL LIQUID DIET 2. ZOFRAN ATC. PHENERGAN/ZOFRAN PRN 3. BID PPI 4. IF VOMITING PERSISTS AFTER UREMIA RESOLVES, PLAN FOR GES/EGD/DIL AS IP. 5. IF VOMITING RESOLVES, OPV IN 4 TO 6 WEEKS FOLLOWED BY EGD/DIL AS OP.   LOS: 3 days   Sandi Fields  05/22/2012, 4:27 PM

## 2012-05-22 NOTE — ED Notes (Signed)
[facility] here for transport back to [facility].  Tolerated procedure well.  CBG 177.

## 2012-05-22 NOTE — Progress Notes (Signed)
Subjective: Interval History: has no complaint of difficulty in breathing. His nausea and vomiting has improved. He still his appetite is poor. Patient denies any chest pain and no abdominal pain..  Objective: Vital signs in last 24 hours: Temp:  [97.3 F (36.3 C)-98.3 F (36.8 C)] 98.1 F (36.7 C) (04/04 0541) Pulse Rate:  [83-94] 94 (04/04 0541) Resp:  [8-22] 18 (04/04 0541) BP: (96-167)/(48-73) 111/62 mmHg (04/04 0541) SpO2:  [91 %-99 %] 95 % (04/04 0541) Weight:  [84.823 kg (187 lb)] 84.823 kg (187 lb) (04/04 0541) Weight change: -2.177 kg (-4 lb 12.8 oz)  Intake/Output from previous day: 04/03 0701 - 04/04 0700 In: 200 [I.V.:200] Out: 2400 [Urine:600] Intake/Output this shift:    General appearance: alert, cooperative and no distress Resp: clear to auscultation bilaterally Cardio: regular rate and rhythm, S1, S2 normal, no murmur, click, rub or gallop GI: soft, non-tender; bowel sounds normal; no masses,  no organomegaly Extremities: extremities normal, atraumatic, no cyanosis or edema  Lab Results:  Recent Labs  05/20/12 0527 05/22/12 0519  WBC 8.4 10.1  HGB 11.1* 10.1*  HCT 32.5* 30.4*  PLT 253 196   BMET:  Recent Labs  05/21/12 0446 05/22/12 0519  NA 140 141  K 4.9 4.2  CL 102 99  CO2 18* 19  GLUCOSE 134* 181*  BUN 134* 88*  CREATININE 14.08* 9.99*  CALCIUM 8.2* 8.7    Recent Labs  05/20/12 1530  PTH 602.6*   Iron Studies:  Recent Labs  05/20/12 0527  IRON 137*  TIBC 224  FERRITIN 380*    Studies/Results: No results found.  I have reviewed the patient's current medications.  Assessment/Plan: Problem #1 renal failure chronic his BUN is 88 and his creatinine Is 9.9 he status post hemodialysis yesterday. Presently his uremic sinus symptoms seems to be improving. Problem #2 anemia most likely secondary to chronic renal failure H&H is low Problem #3 hyperkalemia potassium has corrected Problem #4 coronary artery disease Problem #5  diabetes  Problem #6 metabolic bone disease presently he is on a binder phosphorus is improving but still high. Presently his PTH also seems to be high. Problem #7 history of hypertension his blood pressure seems reasonably controlled Problem #8 peripheral vascular disease status post left BKA.  plan: Patient Etan have a permanent catheter placed today We'll make arrangements for patient to get dialysis tomorrow We'll start him on Epogen 10,000 units IV after each dialysis We'll start him also on Hectrol 4 mcg IV after each dialysis. We'll make arrangement for outpatient dialysis.  LOS: 3 days   Graylyn Bunney S 05/22/2012,8:30 AM

## 2012-05-22 NOTE — Clinical Social Work Note (Addendum)
CSW spoke with Hinton Dyer at [place]. If pt is d/c over weekend, pt has appointment at 8:30 Monday morning for paperwork and treatment. Pt currently at [facility] for catheter. RN notified and Blythe discuss with pt when he returns. RN also aware to hold off on PPD until tomorrow morning so it can be read Monday at outpatient dialysis. Appointment on d/c paperwork.  Benay Pike, [place]

## 2012-05-22 NOTE — Progress Notes (Signed)
Called re: 9/10 Chest pain, 4 doses of NTG given without relief, no diaphoresis, no nausea, mild dizziness, pain in left hip also 9/10. Tachycardic, appears in distress, anxious. Patient noted to be belching. Patient received 1st HD yesterday. 12 lead check at bedside, no signs of ischemia. BP, sats normal.  Orders Given:  Stat Troponin  Morphine, low dose  Ativan, low dose  Gi Cocktail  IV Protonix  Placed on Supplemental O2  Full dose ASA  Responded well to pain control and sedation. He certainly has risk factors for angina, but with negative troponin and normal EKG Jasraj continue to monitor. Prior msk CP vs. GI source.  Lane Hacker, DO Internal Medicine

## 2012-05-22 NOTE — Progress Notes (Signed)
Patient ID: Alan [name], male   DOB: 1940-09-28, 72 y.o.   MRN: [id number] Request received for placement of a tunneled HD catheter on pt with hx of chronic renal failure. Additional PMH as below. EXAM: pt awake/alert; chest- CTA bilat ant; heart- RRR; abd- soft, +BS, mildly tender to palpation; ext- left BKA; no edema rt LE; temp HD cath in left fem vein.   Filed Vitals:   05/21/12 1818 05/21/12 2115 05/22/12 0541 05/22/12 0909  BP: 120/68 139/65 111/62 140/51  Pulse: 84 90 94 94  Temp:  97.6 F (36.4 C) 98.1 F (36.7 C) 98.2 F (36.8 C)  TempSrc:  Oral Oral Oral  Resp: 18 18 18 19   Height:      Weight:   187 lb (84.823 kg)   SpO2: 94% 97% 95% 95%   Past Medical History  Diagnosis Date  . Diabetes mellitus   . Hypertension   . Gout   . Coronary artery disease   . Stroke   . Asthma   . Renal insufficiency   . Hypercholesteremia   . Arthritis   . DDD (degenerative disc disease), lumbar   . DDD (degenerative disc disease), cervical    Past Surgical History  Procedure Laterality Date  . Coronary artery bypass graft    . Below knee leg amputation Left    Ct Abdomen Pelvis Wo Contrast  05/19/2012  *RADIOLOGY REPORT*  Clinical Data: Left-sided chest pain  CT ABDOMEN AND PELVIS WITHOUT CONTRAST  Technique:  Multidetector CT imaging of the abdomen and pelvis was performed following the standard protocol without intravenous contrast.  Comparison: 10/28/2011  Findings: Linear atelectasis or scar at the left base.  No hydronephrosis.  No definite ureteral calculus.  No definite renal calculus.  Perinephric stranding is symmetrical and unchanged.  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  Calcified granulomata in the liver.  No mass.  Post cholecystectomy.  Spleen, pancreas, adrenal glands are within normal limits.  No disproportionate dilatation of bowel.  Atherosclerotic calcifications of the aorta, visceral vasculature, and iliac vessels are noted.  Appendix is not visualized.   Stable appearance of the bladder and prostate with prostate enlargement.  No free fluid.  Advanced degenerative changes within the lumbar spine.  Vacuum disc is noted at multiple levels.  No definite wedge compression deformity in the lumbar spine.  There is a left-sided wedge compression deformity of T8 which has a chronic appearance.  There is severe deformity of the right femoral head with cystic changes and collapse of the articular surface.  There is also severe irregularity of the articular surface of the acetabulum and humeral head.  Cystic changes are noted.  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  No evidence of urinary obstruction.  There is deformity of the right femoral head and hip joint as described.   Original Report Authenticated By: Marybelle Killings, M.D.    Dg Chest 2 View  05/19/2012  *RADIOLOGY REPORT*  Clinical Data: Cough, possible infiltrate  CHEST - 2 VIEW  Comparison: 10/28/2011  Findings: Cardiomediastinal silhouette is stable.  Elevation of the left hemidiaphragm.  No acute infiltrate or pulmonary edema. Dextroscoliosis lower thoracic spine.  IMPRESSION: No active disease.  Elevation of the left hemidiaphragm. Dextroscoliosis of the lower thoracic spine.   Original Report Authenticated By: Lahoma Crocker, M.D.   Results for orders placed during the  hospital encounter of 05/19/12  URINE CULTURE      Result Value Range   Specimen Description URINE, CLEAN CATCH     Special Requests NONE     Culture  Setup Time 05/19/2012 18:52     Colony Count NO GROWTH     Culture NO GROWTH     Report Status 05/20/2012 FINAL    MRSA PCR SCREENING      Result Value Range   MRSA by PCR NEGATIVE  NEGATIVE  CBC WITH DIFFERENTIAL      Result Value Range   WBC 9.1  4.0 - 10.5 K/uL   RBC 4.03 (*) 4.22 - 5.81 MIL/uL   Hemoglobin 11.7  (*) 13.0 - 17.0 g/dL   HCT 33.8 (*) 39.0 - 52.0 %   MCV 83.9  78.0 - 100.0 fL   MCH 29.0  26.0 - 34.0 pg   MCHC 34.6  30.0 - 36.0 g/dL   RDW 14.0  11.5 - 15.5 %   Platelets 256  150 - 400 K/uL   Neutrophils Relative 85 (*) 43 - 77 %   Neutro Abs 7.7  1.7 - 7.7 K/uL   Lymphocytes Relative 13  12 - 46 %   Lymphs Abs 1.1  0.7 - 4.0 K/uL   Monocytes Relative 3  3 - 12 %   Monocytes Absolute 0.2  0.1 - 1.0 K/uL   Eosinophils Relative 0  0 - 5 %   Eosinophils Absolute 0.0  0.0 - 0.7 K/uL   Basophils Relative 0  0 - 1 %   Basophils Absolute 0.0  0.0 - 0.1 K/uL  URINALYSIS, ROUTINE W REFLEX MICROSCOPIC      Result Value Range   Color, Urine YELLOW  YELLOW   APPearance CLEAR  CLEAR   Specific Gravity, Urine 1.015  1.005 - 1.030   pH 5.5  5.0 - 8.0   Glucose, UA NEGATIVE  NEGATIVE mg/dL   Hgb urine dipstick TRACE (*) NEGATIVE   Bilirubin Urine NEGATIVE  NEGATIVE   Ketones, ur NEGATIVE  NEGATIVE mg/dL   Protein, ur TRACE (*) NEGATIVE mg/dL   Urobilinogen, UA 0.2  0.0 - 1.0 mg/dL   Nitrite NEGATIVE  NEGATIVE   Leukocytes, UA NEGATIVE  NEGATIVE  LACTIC ACID, PLASMA      Result Value Range   Lactic Acid, Venous 1.0  0.5 - 2.2 mmol/L  TROPONIN I      Result Value Range   Troponin I <0.30  <0.30 ng/mL  COMPREHENSIVE METABOLIC PANEL      Result Value Range   Sodium 137  135 - 145 mEq/L   Potassium 5.7 (*) 3.5 - 5.1 mEq/L   Chloride 96  96 - 112 mEq/L   CO2 16 (*) 19 - 32 mEq/L   Glucose, Bld 148 (*) 70 - 99 mg/dL   BUN 172 (*) 6 - 23 mg/dL   Creatinine, Ser 17.54 (*) 0.50 - 1.35 mg/dL   Calcium 9.1  8.4 - 10.5 mg/dL   Total Protein 7.3  6.0 - 8.3 g/dL   Albumin 3.0 (*) 3.5 - 5.2 g/dL   AST 15  0 - 37 U/L   ALT 15  0 - 53 U/L   Alkaline Phosphatase 69  39 - 117 U/L   Total Bilirubin 0.2 (*) 0.3 - 1.2 mg/dL   GFR calc non Af Amer 2 (*) >90 mL/min   GFR calc Af Amer 3 (*) >90 mL/min  LIPASE, BLOOD      Result  Value Range   Lipase 51  11 - 59 U/L  URINE MICROSCOPIC-ADD ON       Result Value Range   WBC, UA 0-2  <3 WBC/hpf   RBC / HPF 0-2  <3 RBC/hpf  VITAMIN B12      Result Value Range   Vitamin B-12 1426 (*) 211 - 911 pg/mL  FOLATE      Result Value Range   Folate >20.0    IRON AND TIBC      Result Value Range   Iron 137 (*) 42 - 135 ug/dL   TIBC 224  215 - 435 ug/dL   Saturation Ratios 61 (*) 20 - 55 %   UIBC 87 (*) 125 - 400 ug/dL  FERRITIN      Result Value Range   Ferritin 380 (*) 22 - 322 ng/mL  RETICULOCYTES      Result Value Range   Retic Ct Pct 2.8  0.4 - 3.1 %   RBC. 3.87 (*) 4.22 - 5.81 MIL/uL   Retic Count, Manual 108.4  19.0 - 186.0 K/uL  TSH      Result Value Range   TSH 0.733  0.350 - 4.500 uIU/mL  HEMOGLOBIN A1C      Result Value Range   Hemoglobin A1C 8.2 (*) <5.7 %   Mean Plasma Glucose 189 (*) <117 mg/dL  COMPREHENSIVE METABOLIC PANEL      Result Value Range   Sodium 138  135 - 145 mEq/L   Potassium 6.0 (*) 3.5 - 5.1 mEq/L   Chloride 100  96 - 112 mEq/L   CO2 16 (*) 19 - 32 mEq/L   Glucose, Bld 148 (*) 70 - 99 mg/dL   BUN 175 (*) 6 - 23 mg/dL   Creatinine, Ser 16.75 (*) 0.50 - 1.35 mg/dL   Calcium 8.7  8.4 - 10.5 mg/dL   Total Protein 6.8  6.0 - 8.3 g/dL   Albumin 2.7 (*) 3.5 - 5.2 g/dL   AST 17  0 - 37 U/L   ALT 19  0 - 53 U/L   Alkaline Phosphatase 64  39 - 117 U/L   Total Bilirubin 0.1 (*) 0.3 - 1.2 mg/dL   GFR calc non Af Amer 2 (*) >90 mL/min   GFR calc Af Amer 3 (*) >90 mL/min  CBC      Result Value Range   WBC 8.4  4.0 - 10.5 K/uL   RBC 3.87 (*) 4.22 - 5.81 MIL/uL   Hemoglobin 11.1 (*) 13.0 - 17.0 g/dL   HCT 32.5 (*) 39.0 - 52.0 %   MCV 84.0  78.0 - 100.0 fL   MCH 28.7  26.0 - 34.0 pg   MCHC 34.2  30.0 - 36.0 g/dL   RDW 14.2  11.5 - 15.5 %   Platelets 253  150 - 400 K/uL  GLUCOSE, CAPILLARY      Result Value Range   Glucose-Capillary 155 (*) 70 - 99 mg/dL   Comment 1 Notify RN    GLUCOSE, CAPILLARY      Result Value Range   Glucose-Capillary 139 (*) 70 - 99 mg/dL   Comment 1 Documented in Chart      Comment 2 Notify RN    BASIC METABOLIC PANEL      Result Value Range   Sodium 138  135 - 145 mEq/L   Potassium 5.6 (*) 3.5 - 5.1 mEq/L   Chloride 101  96 - 112 mEq/L   CO2 16 (*) 19 -  32 mEq/L   Glucose, Bld 222 (*) 70 - 99 mg/dL   BUN 171 (*) 6 - 23 mg/dL   Creatinine, Ser 16.56 (*) 0.50 - 1.35 mg/dL   Calcium 8.3 (*) 8.4 - 10.5 mg/dL   GFR calc non Af Amer 2 (*) >90 mL/min   GFR calc Af Amer 3 (*) >90 mL/min  CK      Result Value Range   Total CK 91  7 - 232 U/L  MAGNESIUM      Result Value Range   Magnesium 2.8 (*) 1.5 - 2.5 mg/dL  PHOSPHORUS      Result Value Range   Phosphorus 8.3 (*) 2.3 - 4.6 mg/dL  GLUCOSE, CAPILLARY      Result Value Range   Glucose-Capillary 208 (*) 70 - 99 mg/dL   Comment 1 Documented in Chart     Comment 2 Notify RN    PTH, INTACT AND CALCIUM      Result Value Range   PTH 602.6 (*) 14.0 - 72.0 pg/mL   Calcium, Total (PTH) 7.7 (*) 8.4 - 10.5 mg/dL  HEPATITIS B SURFACE ANTIGEN      Result Value Range   Hepatitis B Surface Ag NEGATIVE  NEGATIVE  HEPATITIS C ANTIBODY      Result Value Range   HCV Ab NEGATIVE  NEGATIVE  C3 COMPLEMENT      Result Value Range   C3 Complement 99  90 - 180 mg/dL  ALT      Result Value Range   ALT 19  0 - 53 U/L  HEPATITIS B CORE ANTIBODY, IGM      Result Value Range   Hep B C IgM NEGATIVE  NEGATIVE  C4 COMPLEMENT      Result Value Range   Complement C4, Body Fluid 30  10 - 40 mg/dL  VITAMIN D 25 HYDROXY      Result Value Range   Vit D, 25-Hydroxy 29 (*) 30 - 89 ng/mL  ANA      Result Value Range   ANA NEGATIVE  NEGATIVE  HEPATITIS B CORE ANTIBODY, TOTAL      Result Value Range   Hep B Core Total Ab NEGATIVE  NEGATIVE  HEPATITIS B SURFACE ANTIBODY      Result Value Range   Hep B S Ab NONREACTIVE  NONREACTIVE  GLUCOSE, CAPILLARY      Result Value Range   Glucose-Capillary 138 (*) 70 - 99 mg/dL   Comment 1 Documented in Chart     Comment 2 Notify RN    BASIC METABOLIC PANEL      Result Value Range    Sodium 140  135 - 145 mEq/L   Potassium 4.9  3.5 - 5.1 mEq/L   Chloride 102  96 - 112 mEq/L   CO2 18 (*) 19 - 32 mEq/L   Glucose, Bld 134 (*) 70 - 99 mg/dL   BUN 134 (*) 6 - 23 mg/dL   Creatinine, Ser 14.08 (*) 0.50 - 1.35 mg/dL   Calcium 8.2 (*) 8.4 - 10.5 mg/dL   GFR calc non Af Amer 3 (*) >90 mL/min   GFR calc Af Amer 3 (*) >90 mL/min  GLUCOSE, CAPILLARY      Result Value Range   Glucose-Capillary 102 (*) 70 - 99 mg/dL   Comment 1 Documented in Chart     Comment 2 Notify RN    GLUCOSE, CAPILLARY      Result Value Range   Glucose-Capillary 138 (*)  70 - 99 mg/dL   Comment 1 Documented in Chart     Comment 2 Notify RN    GLUCOSE, CAPILLARY      Result Value Range   Glucose-Capillary 137 (*) 70 - 99 mg/dL   Comment 1 Documented in Chart     Comment 2 Notify RN    BASIC METABOLIC PANEL      Result Value Range   Sodium 141  135 - 145 mEq/L   Potassium 4.2  3.5 - 5.1 mEq/L   Chloride 99  96 - 112 mEq/L   CO2 19  19 - 32 mEq/L   Glucose, Bld 181 (*) 70 - 99 mg/dL   BUN 88 (*) 6 - 23 mg/dL   Creatinine, Ser 9.99 (*) 0.50 - 1.35 mg/dL   Calcium 8.7  8.4 - 10.5 mg/dL   GFR calc non Af Amer 4 (*) >90 mL/min   GFR calc Af Amer 5 (*) >90 mL/min  CBC      Result Value Range   WBC 10.1  4.0 - 10.5 K/uL   RBC 3.54 (*) 4.22 - 5.81 MIL/uL   Hemoglobin 10.1 (*) 13.0 - 17.0 g/dL   HCT 30.4 (*) 39.0 - 52.0 %   MCV 85.9  78.0 - 100.0 fL   MCH 28.5  26.0 - 34.0 pg   MCHC 33.2  30.0 - 36.0 g/dL   RDW 14.7  11.5 - 15.5 %   Platelets 196  150 - 400 K/uL  PROTIME-INR      Result Value Range   Prothrombin Time 14.2  11.6 - 15.2 seconds   INR 1.11  0.00 - 1.49  APTT      Result Value Range   aPTT 36  24 - 37 seconds  GLUCOSE, CAPILLARY      Result Value Range   Glucose-Capillary 95  70 - 99 mg/dL   Comment 1 Documented in Chart     Comment 2 Notify RN    GLUCOSE, CAPILLARY      Result Value Range   Glucose-Capillary 138 (*) 70 - 99 mg/dL  PHOSPHORUS      Result Value Range    Phosphorus 7.9 (*) 2.3 - 4.6 mg/dL  TROPONIN I      Result Value Range   Troponin I <0.30  <0.30 ng/mL  GLUCOSE, CAPILLARY      Result Value Range   Glucose-Capillary 179 (*) 70 - 99 mg/dL   Comment 1 Notify RN     Comment 2 Documented in Chart     A/P: Pt with chronic renal failure, currently with temp HD cath in left groin. Plan is for placement of a tunneled HD catheter today. Details/risks of procedure d/w pt with his understanding and consent.

## 2012-05-22 NOTE — Procedures (Signed)
R IJ tunneled HD catheter No complication No blood loss. See complete dictation in [facility].

## 2012-05-22 NOTE — ED Notes (Signed)
PA called to obtain consent

## 2012-05-23 DIAGNOSIS — R079 Chest pain, unspecified: Secondary | ICD-10-CM

## 2012-05-23 DIAGNOSIS — R112 Nausea with vomiting, unspecified: Secondary | ICD-10-CM

## 2012-05-23 LAB — CBC
HCT: 29 % — ABNORMAL LOW (ref 39.0–52.0)
Hemoglobin: 9.6 g/dL — ABNORMAL LOW (ref 13.0–17.0)
MCV: 86.3 fL (ref 78.0–100.0)
RBC: 3.36 MIL/uL — ABNORMAL LOW (ref 4.22–5.81)
WBC: 8.1 10*3/uL (ref 4.0–10.5)

## 2012-05-23 LAB — BASIC METABOLIC PANEL
CO2: 20 mEq/L (ref 19–32)
Chloride: 98 mEq/L (ref 96–112)
GFR calc Af Amer: 4 mL/min — ABNORMAL LOW (ref >90)
Potassium: 4 mEq/L (ref 3.5–5.1)

## 2012-05-23 MED ORDER — HEPARIN SODIUM (PORCINE) 1000 UNIT/ML DIALYSIS
20.0000 [IU]/kg | INTRAMUSCULAR | Status: DC | PRN
  Filled 2012-05-23: qty 2

## 2012-05-23 MED ORDER — SODIUM CHLORIDE 0.9 % IV SOLN: 100.0000 mL | INTRAVENOUS | Status: DC | PRN

## 2012-05-23 MED ORDER — TUBERCULIN PPD 5 UNIT/0.1ML ID SOLN
5.0000 [IU] | Freq: Once | INTRADERMAL | Status: AC
  Administered 2012-05-23: 5 [IU] via INTRADERMAL
  Filled 2012-05-23: qty 0.1

## 2012-05-23 MED ORDER — ALTEPLASE 2 MG IJ SOLR
2.0000 mg | Freq: Once | INTRAMUSCULAR | Status: DC | PRN
  Filled 2012-05-23: qty 2

## 2012-05-23 MED ORDER — SODIUM CHLORIDE 0.9 % IV SOLN: INTRAVENOUS | Status: DC

## 2012-05-23 MED ORDER — SODIUM CHLORIDE 0.9 % IV SOLN
INTRAVENOUS | Status: DC
  Administered 2012-05-24: 09:00:00 via INTRAVENOUS

## 2012-05-23 NOTE — Progress Notes (Signed)
Subjective: Interval History: has complaints he'll some nausea and vomiting. The history that he's getting a little bit better. His appetite is improving. He doesn't have any difficulty breathing but she is some chest pain which is similar to what he has from before. He seems to be aggravated by movement..  Objective: Vital signs in last 24 hours: Temp:  [98.1 F (36.7 C)] 98.1 F (36.7 C) (04/05 0620) Pulse Rate:  [89-95] 90 (04/05 0620) Resp:  [9-20] 20 (04/05 0620) BP: (107-189)/(44-70) 189/69 mmHg (04/05 0620) SpO2:  [95 %-100 %] 96 % (04/05 0620) Weight:  [81.012 kg (178 lb 9.6 oz)] 81.012 kg (178 lb 9.6 oz) (04/05 0500) Weight change: -3.81 kg (-8 lb 6.4 oz)  Intake/Output from previous day: 04/04 0701 - 04/05 0700 In: 320 [P.O.:320] Out: 350 [Urine:350] Intake/Output this shift:    General appearance: alert, cooperative and no distress Resp: clear to auscultation bilaterally Cardio: regular rate and rhythm, S1, S2 normal, no murmur, click, rub or gallop GI: soft, non-tender; bowel sounds normal; no masses,  no organomegaly Extremities: extremities normal, atraumatic, no cyanosis or edema  Lab Results:  Recent Labs  05/22/12 0519 05/23/12 0622  WBC 10.1 8.1  HGB 10.1* 9.6*  HCT 30.4* 29.0*  PLT 196 179   BMET:  Recent Labs  05/22/12 0519 05/23/12 0622  NA 141 141  K 4.2 4.0  CL 99 98  CO2 19 20  GLUCOSE 181* 168*  BUN 88* 98*  CREATININE 9.99* 11.63*  CALCIUM 8.7 9.3    Recent Labs  05/20/12 1530  PTH 602.6*   Iron Studies: No results found for this basename: IRON, TIBC, TRANSFERRIN, FERRITIN,  in the last 72 hours  Studies/Results: Ir Fluoro Guide Cv Line Right  05/22/2012  *RADIOLOGY REPORT*  Clinical data: Acute renal failure, needs access for hemodialysis  TUNNELED HEMODIALYSIS CATHETER PLACEMENT WITH ULTRASOUND AND FLUOROSCOPIC GUIDANCE:  Comparison: None  Technique: The procedure, risks, benefits, and alternatives were explained to the  patient.  Questions regarding the procedure were encouraged and answered.  The patient understands and consents to the procedure.  As antibiotic prophylaxis, cefazolin 2 grams was ordered pre- procedure and administered intravenously within one hour of incision.  Patency of the right IJ vein was confirmed with ultrasound with image documentation. An appropriate skin site was determined. Region was prepped using maximum barrier technique including cap and mask, sterile gown, sterile gloves, large sterile sheet, and Chlorhexidine   as cutaneous antisepsis. The region was infiltrated locally with 1% lidocaine.  Intravenous Fentanyl and Versed were administered as conscious sedation during continuous cardiorespiratory monitoring by the radiology RN, with a total moderate sedation time of 15 minutes.  Under real-time ultrasound guidance, the right IJ vein was accessed with a 21 gauge micropuncture needle; the needle tip within the vein was confirmed with ultrasound image documentation.   Needle exchanged over the 018 guidewire for transitional dilator, which allowed advancement of a Benson wire into the IVC. Over this, an MPA catheter was advanced. A Hemosplit 19 hemodialysis catheter was tunneled from the right anterior chest wall approach to the right IJ dermatotomy site. The MPA catheter was exchanged over an Amplatz wire for serial vascular dilators which allow placement of a peel- away sheath, through which the catheter was advanced under intermittent fluoroscopy, positioned with its tips in the proximal and midright atrium. Spot chest radiograph confirms good catheter position. No pneumothorax. Catheter was flushed and primed per protocol. Catheter secured externally with O Prolene sutures. The right  IJ   dermatotomy site was closed  with Dermabond. No immediate complication.  IMPRESSION:  1. Technically successful placement of tunneled right IJ hemodialysis catheter with ultrasound and fluoroscopic guidance. Ready  for routine use.   Original Report Authenticated By: D. Wallace Going, MD    Ir US Guide Vasc Access Right  05/22/2012  *RADIOLOGY REPORT*  Clinical data: Acute renal failure, needs access for hemodialysis  TUNNELED HEMODIALYSIS CATHETER PLACEMENT WITH ULTRASOUND AND FLUOROSCOPIC GUIDANCE:  Comparison: None  Technique: The procedure, risks, benefits, and alternatives were explained to the patient.  Questions regarding the procedure were encouraged and answered.  The patient understands and consents to the procedure.  As antibiotic prophylaxis, cefazolin 2 grams was ordered pre- procedure and administered intravenously within one hour of incision.  Patency of the right IJ vein was confirmed with ultrasound with image documentation. An appropriate skin site was determined. Region was prepped using maximum barrier technique including cap and mask, sterile gown, sterile gloves, large sterile sheet, and Chlorhexidine   as cutaneous antisepsis. The region was infiltrated locally with 1% lidocaine.  Intravenous Fentanyl and Versed were administered as conscious sedation during continuous cardiorespiratory monitoring by the radiology RN, with a total moderate sedation time of 15 minutes.  Under real-time ultrasound guidance, the right IJ vein was accessed with a 21 gauge micropuncture needle; the needle tip within the vein was confirmed with ultrasound image documentation.   Needle exchanged over the 018 guidewire for transitional dilator, which allowed advancement of a Benson wire into the IVC. Over this, an MPA catheter was advanced. A Hemosplit 19 hemodialysis catheter was tunneled from the right anterior chest wall approach to the right IJ dermatotomy site. The MPA catheter was exchanged over an Amplatz wire for serial vascular dilators which allow placement of a peel- away sheath, through which the catheter was advanced under intermittent fluoroscopy, positioned with its tips in the proximal and midright atrium. Spot  chest radiograph confirms good catheter position. No pneumothorax. Catheter was flushed and primed per protocol. Catheter secured externally with O Prolene sutures. The right IJ   dermatotomy site was closed  with Dermabond. No immediate complication.  IMPRESSION:  1. Technically successful placement of tunneled right IJ hemodialysis catheter with ultrasound and fluoroscopic guidance. Ready for routine use.   Original Report Authenticated By: D. Wallace Going, MD     I have reviewed the patient's current medications.  Assessment/Plan: Problem #1 chronic renal failure he status post hemodialysis on Thursday pending creatinine high but improving. His potassium is was in acceptable range. Problem #2 hypertension his blood pressure is fluctuating at this moment seems to be somewhat high. Problem #3 anemia his hemoglobin and hematocrit is slightly low patient on Epogen. Problem #4 metabolic disease calcium is normal phosphorus high PTH high presently patient on a binder and also is going to be started on Hectorol. Problem #5 history of diabetes Problem #6 history of peripheral vascular disease status post left BKA Problem #7 this status post CABG and infection as the status post surgery. Problem #8 history of nausea vomiting still is not getting any better. Patient also seems to have this epigastric pain presently GI consult is pending. Plan: We'll make arrangements for patient to get dialysis today We'll continue with Epogen and Hectorol.    LOS: 4 days   Tiant Peixoto S 05/23/2012,9:16 AM

## 2012-05-23 NOTE — Progress Notes (Signed)
Patient seen in hemodialysis.  Complains of intermittent lower retrosternal chest pain and ongoing nausea. Nursing staff reports he hasn't tried any solid food or liquids secondary to nausea so far today   Vital signs in last 24 hours: Temp:  [98.1 F (36.7 C)] 98.1 F (36.7 C) (04/05 0620) Pulse Rate:  [89-95] 90 (04/05 0620) Resp:  [9-20] 20 (04/05 0620) BP: (107-189)/(44-70) 189/69 mmHg (04/05 0620) SpO2:  [95 %-100 %] 96 % (04/05 0620) Weight:  [178 lb 9.6 oz (81.012 kg)] 178 lb 9.6 oz (81.012 kg) (04/05 0500) Last BM Date: 05/22/12 General:   Alert,   pleasant and cooperative in NAD Abdomen:  Full. Positive bowel sounds. No succussion splash. Abdomen is soft and nontender; no mass or organomegaly. . Extremities:  Without clubbing or edema.  BKA  Intake/Output from previous day: 04/04 0701 - 04/05 0700 In: 320 [P.O.:320] Out: 350 [Urine:350] Intake/Output this shift:    Lab Results:  Recent Labs  05/22/12 0519 05/23/12 0622  WBC 10.1 8.1  HGB 10.1* 9.6*  HCT 30.4* 29.0*  PLT 196 179   BMET  Recent Labs  05/21/12 0446 05/22/12 0519 05/23/12 0622  NA 140 141 141  K 4.9 4.2 4.0  CL 102 99 98  CO2 18* 19 20  GLUCOSE 134* 181* 168*  BUN 134* 88* 98*  CREATININE 14.08* 9.99* 11.63*  CALCIUM 8.2* 8.7 9.3   LFT  Recent Labs  05/20/12 1530  ALT 19   PT/INR  Recent Labs  05/22/12 0519  LABPROT 14.2  INR 1.11    Impression: 72 year old gentleman with renal failure now on hemodialysis with atypical chest pain not likely cardiac in etiology along with ongoing nausea and vomiting. As discussed with Dr. Conley Canal, given degree of pain associated with nausea and vomiting I feel would be best to go ahead and offer this gentleman a diagnostic EGD in the near future. The risks, benefits, limitations, alternatives and imponderables have been reviewed with the patient. Potential for esophageal dilation, biopsy, etc. have also been reviewed.  Questions have been  answered. All parties agreeable. Sachin stop heparin prior to procedure.

## 2012-05-23 NOTE — Progress Notes (Signed)
Subjective: Still with nausea, but less vomiting. Substernal chest discomfort improving on Carafate. Still with some left chest wall pain  Objective: Vital signs in last 24 hours: Filed Vitals:   05/23/12 1330 05/23/12 1400 05/23/12 1410 05/23/12 1445  BP: 105/79 109/79 128/57 128/66  Pulse: 79 79 81 79  Temp:    98.2 F (36.8 C)  TempSrc:    Oral  Resp: 16 15 16 20   Height:      Weight:    80.7 kg (177 lb 14.6 oz)  SpO2:    100%   Weight change: -3.81 kg (-8 lb 6.4 oz)  Intake/Output Summary (Last 24 hours) at 05/23/12 1458 Last data filed at 05/23/12 1445  Gross per 24 hour  Intake    120 ml  Output    350 ml  Net   -230 ml   General: Asleep. Arousable. Comfortable. No emesis basin at his side today! Lungs clear to auscultation bilaterally without wheeze rhonchi or rales Cardiovascular regular rate rhythm without murmurs gallops rubs Abdomen soft mild epigastric tenderness nondistended Extremities: Left BKA. Right leg without edema. Skin/musculoskeletal: Left chest less tender to palpation. No erythema, fluctuance, induration. Large healed surgical scar over the chest centrally.   Lab Results: Basic Metabolic Panel:  Recent Labs Lab 05/20/12 0527 05/20/12 0941  05/22/12 0519 05/23/12 0622  NA 138  --   < > 141 141  K 6.0*  --   < > 4.2 4.0  CL 100  --   < > 99 98  CO2 16*  --   < > 19 20  GLUCOSE 148*  --   < > 181* 168*  BUN 175*  --   < > 88* 98*  CREATININE 16.75*  --   < > 9.99* 11.63*  CALCIUM 8.7  --   < > 8.7 9.3  MG  --  2.8*  --   --   --   PHOS  --  8.3*  --  7.9*  --   < > = values in this interval not displayed. Liver Function Tests:  Recent Labs Lab 05/19/12 1635 05/20/12 0527 05/20/12 1530  AST 15 17  --   ALT 15 19 19   ALKPHOS 69 64  --   BILITOT 0.2* 0.1*  --   PROT 7.3 6.8  --   ALBUMIN 3.0* 2.7*  --     Recent Labs Lab 05/19/12 1635  LIPASE 51   No results found for this basename: AMMONIA,  in the last 168  hours CBC:  Recent Labs Lab 05/19/12 1635  05/22/12 0519 05/23/12 0622  WBC 9.1  < > 10.1 8.1  NEUTROABS 7.7  --   --   --   HGB 11.7*  < > 10.1* 9.6*  HCT 33.8*  < > 30.4* 29.0*  MCV 83.9  < > 85.9 86.3  PLT 256  < > 196 179  < > = values in this interval not displayed. Cardiac Enzymes:  Recent Labs Lab 05/19/12 1635 05/20/12 1129 05/21/12 2332  CKTOTAL  --  91  --   TROPONINI <0.30  --  <0.30   BNP: No results found for this basename: PROBNP,  in the last 168 hours D-Dimer: No results found for this basename: DDIMER,  in the last 168 hours CBG:  Recent Labs Lab 05/21/12 1143 05/21/12 1715 05/21/12 2115 05/22/12 0735 05/22/12 1040 05/22/12 1148  GLUCAP 137* 95 138* 179* 177* 172*   Hemoglobin A1C:  Recent Labs  Lab 05/19/12 2126  HGBA1C 8.2*   Fasting Lipid Panel: No results found for this basename: CHOL, HDL, LDLCALC, TRIG, CHOLHDL, LDLDIRECT,  in the last 168 hours Thyroid Function Tests:  Recent Labs Lab 05/19/12 2126  TSH 0.733   Coagulation:  Recent Labs Lab 05/22/12 0519  LABPROT 14.2  INR 1.11   Anemia Panel:  Recent Labs Lab 05/20/12 0527  VITAMINB12 1426*  FOLATE >20.0  FERRITIN 380*  TIBC 224  IRON 137*  RETICCTPCT 2.8   Urine Drug Screen: Drugs of Abuse  No results found for this basename: labopia,  cocainscrnur,  labbenz,  amphetmu,  thcu,  labbarb    Alcohol Level: No results found for this basename: ETH,  in the last 168 hours Urinalysis:  Recent Labs Lab 05/19/12 1656  COLORURINE YELLOW  LABSPEC 1.015  PHURINE 5.5  [place]*  UROBILINOGEN 0.2  NITRITE NEGATIVE  LEUKOCYTESUR NEGATIVE   Micro Results: Recent Results (from the past 240 hour(s))  URINE CULTURE     Status: None   Collection Time    05/19/12  4:56 PM      Result Value Range Status   Specimen Description URINE, CLEAN CATCH   Final   Special Requests NONE    Final   Culture  Setup Time 05/19/2012 18:52   Final   Colony Count NO GROWTH   Final   Culture NO GROWTH   Final   Report Status 05/20/2012 FINAL   Final  MRSA PCR SCREENING     Status: None   Collection Time    05/19/12 10:41 PM      Result Value Range Status   MRSA by PCR NEGATIVE  NEGATIVE Final   Comment:            The GeneXpert MRSA Assay (FDA     approved for NASAL specimens     only), is one component of a     comprehensive MRSA colonization     surveillance program. It is not     intended to diagnose MRSA     infection nor to guide or     monitor treatment for     MRSA infections.   Studies/Results: Ir Fluoro Guide Cv Line Right  05/22/2012  *RADIOLOGY REPORT*  Clinical data: Acute renal failure, needs access for hemodialysis  TUNNELED HEMODIALYSIS CATHETER PLACEMENT WITH ULTRASOUND AND FLUOROSCOPIC GUIDANCE:  Comparison: None  Technique: The procedure, risks, benefits, and alternatives were explained to the patient.  Questions regarding the procedure were encouraged and answered.  The patient understands and consents to the procedure.  As antibiotic prophylaxis, cefazolin 2 grams was ordered pre- procedure and administered intravenously within one hour of incision.  Patency of the right IJ vein was confirmed with ultrasound with image documentation. An appropriate skin site was determined. Region was prepped using maximum barrier technique including cap and mask, sterile gown, sterile gloves, large sterile sheet, and Chlorhexidine   as cutaneous antisepsis. The region was infiltrated locally with 1% lidocaine.  Intravenous Fentanyl and Versed were administered as conscious sedation during continuous cardiorespiratory monitoring by the radiology RN, with a total moderate sedation time of 15 minutes.  Under real-time ultrasound guidance, the right IJ vein was accessed with a 21 gauge micropuncture needle; the needle tip within the vein was confirmed with ultrasound image documentation.    Needle exchanged over the 018 guidewire for transitional dilator, which allowed advancement of a Benson wire  into the IVC. Over this, an MPA catheter was advanced. A Hemosplit 19 hemodialysis catheter was tunneled from the right anterior chest wall approach to the right IJ dermatotomy site. The MPA catheter was exchanged over an Amplatz wire for serial vascular dilators which allow placement of a peel- away sheath, through which the catheter was advanced under intermittent fluoroscopy, positioned with its tips in the proximal and midright atrium. Spot chest radiograph confirms good catheter position. No pneumothorax. Catheter was flushed and primed per protocol. Catheter secured externally with O Prolene sutures. The right IJ   dermatotomy site was closed  with Dermabond. No immediate complication.  IMPRESSION:  1. Technically successful placement of tunneled right IJ hemodialysis catheter with ultrasound and fluoroscopic guidance. Ready for routine use.   Original Report Authenticated By: D. Wallace Going, MD    Ir US Guide Vasc Access Right  05/22/2012  *RADIOLOGY REPORT*  Clinical data: Acute renal failure, needs access for hemodialysis  TUNNELED HEMODIALYSIS CATHETER PLACEMENT WITH ULTRASOUND AND FLUOROSCOPIC GUIDANCE:  Comparison: None  Technique: The procedure, risks, benefits, and alternatives were explained to the patient.  Questions regarding the procedure were encouraged and answered.  The patient understands and consents to the procedure.  As antibiotic prophylaxis, cefazolin 2 grams was ordered pre- procedure and administered intravenously within one hour of incision.  Patency of the right IJ vein was confirmed with ultrasound with image documentation. An appropriate skin site was determined. Region was prepped using maximum barrier technique including cap and mask, sterile gown, sterile gloves, large sterile sheet, and Chlorhexidine   as cutaneous antisepsis. The region was infiltrated locally with 1%  lidocaine.  Intravenous Fentanyl and Versed were administered as conscious sedation during continuous cardiorespiratory monitoring by the radiology RN, with a total moderate sedation time of 15 minutes.  Under real-time ultrasound guidance, the right IJ vein was accessed with a 21 gauge micropuncture needle; the needle tip within the vein was confirmed with ultrasound image documentation.   Needle exchanged over the 018 guidewire for transitional dilator, which allowed advancement of a Benson wire into the IVC. Over this, an MPA catheter was advanced. A Hemosplit 19 hemodialysis catheter was tunneled from the right anterior chest wall approach to the right IJ dermatotomy site. The MPA catheter was exchanged over an Amplatz wire for serial vascular dilators which allow placement of a peel- away sheath, through which the catheter was advanced under intermittent fluoroscopy, positioned with its tips in the proximal and midright atrium. Spot chest radiograph confirms good catheter position. No pneumothorax. Catheter was flushed and primed per protocol. Catheter secured externally with O Prolene sutures. The right IJ   dermatotomy site was closed  with Dermabond. No immediate complication.  IMPRESSION:  1. Technically successful placement of tunneled right IJ hemodialysis catheter with ultrasound and fluoroscopic guidance. Ready for routine use.   Original Report Authenticated By: D. Wallace Going, MD    Scheduled Meds: . aspirin  325 mg Oral Daily  . doxercalciferol  4 mcg Intravenous Q T,Th,Sa-HD  . fluticasone  2 spray Each Nare Daily  . furosemide  200 mg Intravenous BID  . heparin  5,000 Units Subcutaneous Q8H  . insulin aspart  0-9 Units Subcutaneous TID WC  . ondansetron (ZOFRAN) IV  4 mg Intravenous TID WC & HS  . pantoprazole  40 mg Oral BID AC  . sevelamer carbonate  1,600 mg Oral TID WC  . sodium chloride  3 mL Intravenous Q12H  . tuberculin  5 Units Intradermal Once  .  tuberculin  5 Units  Intradermal Once   Continuous Infusions:   PRN Meds:.sodium chloride, sodium chloride, acetaminophen, acetaminophen, albuterol, alteplase, bisacodyl, feeding supplement (NEPRO CARB STEADY), heparin, hydrALAZINE, HYDROmorphone (DILAUDID) injection, lidocaine (PF), nitroGLYCERIN, ondansetron (ZOFRAN) IV, ondansetron, pentafluoroprop-tetrafluoroeth, promethazine, sucralfate Assessment/Plan:    Acute renal failure: Status post permacath placement. Getting dialysis today. Outpatient dialysis has been arranged by social work, but patient is not yet stable for discharge.  Chest pain: Initial pain was left-sided and clearly musculoskeletal. The pain he had 4/3 was more centrally located. Did not improve with nitroglycerin. EKG and troponin normal. Patient has ongoing N/V. Consider gastritis/esophagitis/ulcers. Patient is on twice daily PPI and Carafate. LFTs and lipase normal. She reports a history of ulcers previously but details are sketchy. GI to consider EGD. Patient has been getting  scheduled Zofran, and continue when necessary Phenergan.  Nausea vomiting initially thought to be uremia, but has continued despite several dialysis treatments. If continues, could be gastroparesis.    DM type 2 causing vascular disease: Continue sliding scale    HTN (hypertension), benign, better controlled. IV hydralazine until able to take anything by mouth.    CAD (coronary artery disease): Stable    Musculoskeletal chest pain: No NSAIDs due to renal failure and hyperkalemia  Resolved hyperkalemia    S/P BKA (below knee amputation) unilateral, left   LOS: 4 days   Rilley Poulter L 05/23/2012, 2:58 PM

## 2012-05-24 ENCOUNTER — Encounter (HOSPITAL_COMMUNITY): Payer: Self-pay | Admitting: *Deleted

## 2012-05-24 ENCOUNTER — Encounter (HOSPITAL_COMMUNITY)
Admission: EM | Disposition: A | Discharge status: Home / Self Care | Payer: Self-pay | Source: Home / Self Care | Attending: Internal Medicine

## 2012-05-24 DIAGNOSIS — R1013 Epigastric pain: Secondary | ICD-10-CM

## 2012-05-24 DIAGNOSIS — R112 Nausea with vomiting, unspecified: Secondary | ICD-10-CM

## 2012-05-24 DIAGNOSIS — K259 Gastric ulcer, unspecified as acute or chronic, without hemorrhage or perforation: Secondary | ICD-10-CM

## 2012-05-24 DIAGNOSIS — R079 Chest pain, unspecified: Secondary | ICD-10-CM

## 2012-05-24 HISTORY — PX: ESOPHAGOGASTRODUODENOSCOPY: SHX5428

## 2012-05-24 LAB — CBC
MCV: 87.9 fL (ref 78.0–100.0)
Platelets: 164 10*3/uL (ref 150–400)
RBC: 3.13 MIL/uL — ABNORMAL LOW (ref 4.22–5.81)
WBC: 7.2 10*3/uL (ref 4.0–10.5)

## 2012-05-24 LAB — GLUCOSE, CAPILLARY
Glucose-Capillary: 148 mg/dL — ABNORMAL HIGH (ref 70–99)
Glucose-Capillary: 157 mg/dL — ABNORMAL HIGH (ref 70–99)
Glucose-Capillary: 164 mg/dL — ABNORMAL HIGH (ref 70–99)
Glucose-Capillary: 132 mg/dL — ABNORMAL HIGH (ref 70–99)

## 2012-05-24 LAB — BASIC METABOLIC PANEL
CO2: 25 mEq/L (ref 19–32)
Calcium: 8.9 mg/dL (ref 8.4–10.5)
Chloride: 94 mEq/L — ABNORMAL LOW (ref 96–112)
Sodium: 138 mEq/L (ref 135–145)

## 2012-05-24 SURGERY — EGD (ESOPHAGOGASTRODUODENOSCOPY)
Anesthesia: Moderate Sedation | Laterality: Left

## 2012-05-24 MED ORDER — MIDAZOLAM HCL 5 MG/5ML IJ SOLN
INTRAMUSCULAR | Status: AC
  Filled 2012-05-24: qty 10

## 2012-05-24 MED ORDER — POTASSIUM CHLORIDE CRYS ER 20 MEQ PO TBCR
40.0000 meq | EXTENDED_RELEASE_TABLET | Freq: Once | ORAL | Status: AC
  Administered 2012-05-24: 40 meq via ORAL
  Filled 2012-05-24: qty 2

## 2012-05-24 MED ORDER — MEPERIDINE HCL 100 MG/ML IJ SOLN
INTRAMUSCULAR | Status: DC | PRN
  Administered 2012-05-24: 50 mg via INTRAVENOUS
  Administered 2012-05-24: 25 mg via INTRAVENOUS

## 2012-05-24 MED ORDER — METOCLOPRAMIDE HCL 5 MG/ML IJ SOLN
5.0000 mg | Freq: Four times a day (QID) | INTRAMUSCULAR | Status: DC
  Administered 2012-05-24 – 2012-05-25 (×4): 5 mg via INTRAVENOUS
  Filled 2012-05-24 (×4): qty 2

## 2012-05-24 MED ORDER — BUTAMBEN-TETRACAINE-BENZOCAINE 2-2-14 % EX AERO
INHALATION_SPRAY | CUTANEOUS | Status: DC | PRN
  Administered 2012-05-24: 2 via TOPICAL

## 2012-05-24 MED ORDER — STERILE WATER FOR IRRIGATION IR SOLN
Status: DC | PRN
  Administered 2012-05-24: 09:00:00

## 2012-05-24 MED ORDER — ONDANSETRON HCL 4 MG/2ML IJ SOLN
INTRAMUSCULAR | Status: DC | PRN
  Administered 2012-05-24: 4 mg via INTRAVENOUS

## 2012-05-24 MED ORDER — HEPARIN SODIUM (PORCINE) 5000 UNIT/ML IJ SOLN
5000.0000 [IU] | Freq: Three times a day (TID) | INTRAMUSCULAR | Status: DC
  Administered 2012-05-25: 5000 [IU] via SUBCUTANEOUS
  Filled 2012-05-24 (×3): qty 1

## 2012-05-24 MED ORDER — ONDANSETRON HCL 4 MG/2ML IJ SOLN
INTRAMUSCULAR | Status: AC
  Filled 2012-05-24: qty 2

## 2012-05-24 MED ORDER — MEPERIDINE HCL 100 MG/ML IJ SOLN
INTRAMUSCULAR | Status: AC
  Filled 2012-05-24: qty 2

## 2012-05-24 MED ORDER — MIDAZOLAM HCL 5 MG/5ML IJ SOLN
INTRAMUSCULAR | Status: DC | PRN
  Administered 2012-05-24 (×3): 1 mg via INTRAVENOUS
  Administered 2012-05-24: 2 mg via INTRAVENOUS

## 2012-05-24 NOTE — Progress Notes (Addendum)
Ran x 4 hours, Unable to pull any fluid due to deceased BP gave 2 100 ml NS boluses. Report given Richarda Osmond RN.

## 2012-05-24 NOTE — Progress Notes (Signed)
Subjective: This man still continues to feel nauseous despite dialysis yesterday and significant decrease in his uremia. He is due to have EGD this morning.           Physical Exam: Blood pressure 161/70, pulse 85, temperature 98.4 F (36.9 C), temperature source Oral, resp. rate 20, height 5\' 9"  (1.753 m), weight 81.784 kg (180 lb 4.8 oz), SpO2 97.00%. He looks systemically well. Is not toxic or septic clinically. Heart sounds are present and normal without murmurs. Lung fields are clear. He is alert and orientated. Abdomen is soft and not  tender.   Investigations:  Recent Results (from the past 240 hour(s))  URINE CULTURE     Status: None   Collection Time    05/19/12  4:56 PM      Result Value Range Status   Specimen Description URINE, CLEAN CATCH   Final   Special Requests NONE   Final   Culture  Setup Time 05/19/2012 18:52   Final   Colony Count NO GROWTH   Final   Culture NO GROWTH   Final   Report Status 05/20/2012 FINAL   Final  MRSA PCR SCREENING     Status: None   Collection Time    05/19/12 10:41 PM      Result Value Range Status   MRSA by PCR NEGATIVE  NEGATIVE Final   Comment:            The GeneXpert MRSA Assay (FDA     approved for NASAL specimens     only), is one component of a     comprehensive MRSA colonization     surveillance program. It is not     intended to diagnose MRSA     infection nor to guide or     monitor treatment for     MRSA infections.     Basic Metabolic Panel:  Recent Labs  05/22/12 0519 05/23/12 0622 05/24/12 0554  NA 141 141 138  K 4.2 4.0 3.4*  CL 99 98 94*  CO2 19 20 25   GLUCOSE 181* 168* 155*  BUN 88* 98* 38*  CREATININE 9.99* 11.63* 5.73*  CALCIUM 8.7 9.3 8.9  PHOS 7.9*  --  5.6*       CBC:  Recent Labs  05/23/12 0622 05/24/12 0554  WBC 8.1 7.2  HGB 9.6* 9.0*  HCT 29.0* 27.5*  MCV 86.3 87.9  PLT 179 164    Ir Fluoro Guide Cv Line Right  05/22/2012  *RADIOLOGY REPORT*  Clinical data:  Acute renal failure, needs access for hemodialysis  TUNNELED HEMODIALYSIS CATHETER PLACEMENT WITH ULTRASOUND AND FLUOROSCOPIC GUIDANCE:  Comparison: None  Technique: The procedure, risks, benefits, and alternatives were explained to the patient.  Questions regarding the procedure were encouraged and answered.  The patient understands and consents to the procedure.  As antibiotic prophylaxis, cefazolin 2 grams was ordered pre- procedure and administered intravenously within one hour of incision.  Patency of the right IJ vein was confirmed with ultrasound with image documentation. An appropriate skin site was determined. Region was prepped using maximum barrier technique including cap and mask, sterile gown, sterile gloves, large sterile sheet, and Chlorhexidine   as cutaneous antisepsis. The region was infiltrated locally with 1% lidocaine.  Intravenous Fentanyl and Versed were administered as conscious sedation during continuous cardiorespiratory monitoring by the radiology RN, with a total moderate sedation time of 15 minutes.  Under real-time ultrasound guidance, the right IJ vein was accessed with a  21 gauge micropuncture needle; the needle tip within the vein was confirmed with ultrasound image documentation.   Needle exchanged over the 018 guidewire for transitional dilator, which allowed advancement of a Benson wire into the IVC. Over this, an MPA catheter was advanced. A Hemosplit 19 hemodialysis catheter was tunneled from the right anterior chest wall approach to the right IJ dermatotomy site. The MPA catheter was exchanged over an Amplatz wire for serial vascular dilators which allow placement of a peel- away sheath, through which the catheter was advanced under intermittent fluoroscopy, positioned with its tips in the proximal and midright atrium. Spot chest radiograph confirms good catheter position. No pneumothorax. Catheter was flushed and primed per protocol. Catheter secured externally with O Prolene  sutures. The right IJ   dermatotomy site was closed  with Dermabond. No immediate complication.  IMPRESSION:  1. Technically successful placement of tunneled right IJ hemodialysis catheter with ultrasound and fluoroscopic guidance. Ready for routine use.   Original Report Authenticated By: D. Wallace Going, MD    Ir US Guide Vasc Access Right  05/22/2012  *RADIOLOGY REPORT*  Clinical data: Acute renal failure, needs access for hemodialysis  TUNNELED HEMODIALYSIS CATHETER PLACEMENT WITH ULTRASOUND AND FLUOROSCOPIC GUIDANCE:  Comparison: None  Technique: The procedure, risks, benefits, and alternatives were explained to the patient.  Questions regarding the procedure were encouraged and answered.  The patient understands and consents to the procedure.  As antibiotic prophylaxis, cefazolin 2 grams was ordered pre- procedure and administered intravenously within one hour of incision.  Patency of the right IJ vein was confirmed with ultrasound with image documentation. An appropriate skin site was determined. Region was prepped using maximum barrier technique including cap and mask, sterile gown, sterile gloves, large sterile sheet, and Chlorhexidine   as cutaneous antisepsis. The region was infiltrated locally with 1% lidocaine.  Intravenous Fentanyl and Versed were administered as conscious sedation during continuous cardiorespiratory monitoring by the radiology RN, with a total moderate sedation time of 15 minutes.  Under real-time ultrasound guidance, the right IJ vein was accessed with a 21 gauge micropuncture needle; the needle tip within the vein was confirmed with ultrasound image documentation.   Needle exchanged over the 018 guidewire for transitional dilator, which allowed advancement of a Benson wire into the IVC. Over this, an MPA catheter was advanced. A Hemosplit 19 hemodialysis catheter was tunneled from the right anterior chest wall approach to the right IJ dermatotomy site. The MPA catheter was  exchanged over an Amplatz wire for serial vascular dilators which allow placement of a peel- away sheath, through which the catheter was advanced under intermittent fluoroscopy, positioned with its tips in the proximal and midright atrium. Spot chest radiograph confirms good catheter position. No pneumothorax. Catheter was flushed and primed per protocol. Catheter secured externally with O Prolene sutures. The right IJ   dermatotomy site was closed  with Dermabond. No immediate complication.  IMPRESSION:  1. Technically successful placement of tunneled right IJ hemodialysis catheter with ultrasound and fluoroscopic guidance. Ready for routine use.   Original Report Authenticated By: D. Wallace Going, MD       Medications: I have reviewed the patient's current medications.  Impression: 1. Acute renal failure, requiring hemodialysis. History of chronic kidney disease. 2. Ongoing nausea, unclear etiology, partly related to uremia but also the possibility of esophageal or gastric pathology needs to be investigated.  3. Type 2 diabetes mellitus. 4. Hypertension, reasonable control. 5. Peripheral vascular disease with status post left below-knee amputation. 6.  Musculoskeletal chest pain, not a major issue currently. 7. Hypokalemia.     Plan: 1. Replete potassium. 2. EGD today. 3. Not quite ready for discharge as he has ongoing nausea.     LOS: 5 days   Doree Albee Pager [phone]  05/24/2012, 8:07 AM

## 2012-05-24 NOTE — Progress Notes (Signed)
Subjective: Interval History: has no complaint of difficulty in breathing. Patient still complains of nausea and also vomiting. He has some abdominal discomfort..  Objective: Vital signs in last 24 hours: Temp:  [98.2 F (36.8 C)-98.8 F (37.1 C)] 98.4 F (36.9 C) (04/06 0622) Pulse Rate:  [72-85] 85 (04/06 0622) Resp:  [14-26] 20 (04/06 0622) BP: (74-181)/(45-87) 161/70 mmHg (04/06 0622) SpO2:  [97 %-100 %] 97 % (04/06 0622) Weight:  [80.7 kg (177 lb 14.6 oz)-81.784 kg (180 lb 4.8 oz)] 81.784 kg (180 lb 4.8 oz) (04/06 0622) Weight change: -0.312 kg (-11 oz)  Intake/Output from previous day: 04/05 0701 - 04/06 0700 In: -  Out: 300 [Urine:300] Intake/Output this shift:    General appearance: alert, cooperative and no distress Resp: clear to auscultation bilaterally Cardio: regular rate and rhythm, S1, S2 normal, no murmur, click, rub or gallop GI: soft, non-tender; bowel sounds normal; no masses,  no organomegaly Extremities: extremities normal, atraumatic, no cyanosis or edema  Lab Results:  Recent Labs  05/23/12 0622 05/24/12 0554  WBC 8.1 7.2  HGB 9.6* 9.0*  HCT 29.0* 27.5*  PLT 179 164   BMET:  Recent Labs  05/23/12 0622 05/24/12 0554  NA 141 138  K 4.0 3.4*  CL 98 94*  CO2 20 25  GLUCOSE 168* 155*  BUN 98* 38*  CREATININE 11.63* 5.73*  CALCIUM 9.3 8.9   No results found for this basename: PTH,  in the last 72 hours Iron Studies: No results found for this basename: IRON, TIBC, TRANSFERRIN, FERRITIN,  in the last 72 hours  Studies/Results: Ir Fluoro Guide Cv Line Right  05/22/2012  *RADIOLOGY REPORT*  Clinical data: Acute renal failure, needs access for hemodialysis  TUNNELED HEMODIALYSIS CATHETER PLACEMENT WITH ULTRASOUND AND FLUOROSCOPIC GUIDANCE:  Comparison: None  Technique: The procedure, risks, benefits, and alternatives were explained to the patient.  Questions regarding the procedure were encouraged and answered.  The patient understands and  consents to the procedure.  As antibiotic prophylaxis, cefazolin 2 grams was ordered pre- procedure and administered intravenously within one hour of incision.  Patency of the right IJ vein was confirmed with ultrasound with image documentation. An appropriate skin site was determined. Region was prepped using maximum barrier technique including cap and mask, sterile gown, sterile gloves, large sterile sheet, and Chlorhexidine   as cutaneous antisepsis. The region was infiltrated locally with 1% lidocaine.  Intravenous Fentanyl and Versed were administered as conscious sedation during continuous cardiorespiratory monitoring by the radiology RN, with a total moderate sedation time of 15 minutes.  Under real-time ultrasound guidance, the right IJ vein was accessed with a 21 gauge micropuncture needle; the needle tip within the vein was confirmed with ultrasound image documentation.   Needle exchanged over the 018 guidewire for transitional dilator, which allowed advancement of a Benson wire into the IVC. Over this, an MPA catheter was advanced. A Hemosplit 19 hemodialysis catheter was tunneled from the right anterior chest wall approach to the right IJ dermatotomy site. The MPA catheter was exchanged over an Amplatz wire for serial vascular dilators which allow placement of a peel- away sheath, through which the catheter was advanced under intermittent fluoroscopy, positioned with its tips in the proximal and midright atrium. Spot chest radiograph confirms good catheter position. No pneumothorax. Catheter was flushed and primed per protocol. Catheter secured externally with O Prolene sutures. The right IJ   dermatotomy site was closed  with Dermabond. No immediate complication.  IMPRESSION:  1. Technically successful placement  of tunneled right IJ hemodialysis catheter with ultrasound and fluoroscopic guidance. Ready for routine use.   Original Report Authenticated By: D. Wallace Going, MD    Ir US Guide Vasc Access  Right  05/22/2012  *RADIOLOGY REPORT*  Clinical data: Acute renal failure, needs access for hemodialysis  TUNNELED HEMODIALYSIS CATHETER PLACEMENT WITH ULTRASOUND AND FLUOROSCOPIC GUIDANCE:  Comparison: None  Technique: The procedure, risks, benefits, and alternatives were explained to the patient.  Questions regarding the procedure were encouraged and answered.  The patient understands and consents to the procedure.  As antibiotic prophylaxis, cefazolin 2 grams was ordered pre- procedure and administered intravenously within one hour of incision.  Patency of the right IJ vein was confirmed with ultrasound with image documentation. An appropriate skin site was determined. Region was prepped using maximum barrier technique including cap and mask, sterile gown, sterile gloves, large sterile sheet, and Chlorhexidine   as cutaneous antisepsis. The region was infiltrated locally with 1% lidocaine.  Intravenous Fentanyl and Versed were administered as conscious sedation during continuous cardiorespiratory monitoring by the radiology RN, with a total moderate sedation time of 15 minutes.  Under real-time ultrasound guidance, the right IJ vein was accessed with a 21 gauge micropuncture needle; the needle tip within the vein was confirmed with ultrasound image documentation.   Needle exchanged over the 018 guidewire for transitional dilator, which allowed advancement of a Benson wire into the IVC. Over this, an MPA catheter was advanced. A Hemosplit 19 hemodialysis catheter was tunneled from the right anterior chest wall approach to the right IJ dermatotomy site. The MPA catheter was exchanged over an Amplatz wire for serial vascular dilators which allow placement of a peel- away sheath, through which the catheter was advanced under intermittent fluoroscopy, positioned with its tips in the proximal and midright atrium. Spot chest radiograph confirms good catheter position. No pneumothorax. Catheter was flushed and primed per  protocol. Catheter secured externally with O Prolene sutures. The right IJ   dermatotomy site was closed  with Dermabond. No immediate complication.  IMPRESSION:  1. Technically successful placement of tunneled right IJ hemodialysis catheter with ultrasound and fluoroscopic guidance. Ready for routine use.   Original Report Authenticated By: D. Wallace Going, MD     I have reviewed the patient's current medications.  Assessment/Plan: Problem #1 renal failure patient is status post hemodialysis yesterday BUN and creatinine has come down and presently his potassium is normal. Problem #2 nausea and vomiting is still patient has similar complaints. Initially 2 to be secondary to uremia however even when his BUN and creatinine has come down still patient has similar problem Problem #3 diabetes Problem #4 metabolic bone disease patient presently on a binder and phosphorus has improved. Calcium is was normal range and PTH is high. 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 Plan: We'll check again his basic metabolic panel and CBC in the morning I agree with GI consult and possible endoscopy..    LOS: 5 days   Zena Vitelli S 05/24/2012,8:56 AM

## 2012-05-24 NOTE — Op Note (Addendum)
[facility] [address] [place], [address]   ENDOSCOPY PROCEDURE REPORT  PATIENT: Alan [name], Alan [name]  MR#: [id number] BIRTHDATE: 1940-11-14 , 71  yrs. old GENDER: Male ENDOSCOPIST: R.  Garfield Cornea, MD FACP FACG REFERRED BY:  Fran Lowes, M.D. PROCEDURE DATE:  05/24/2012 PROCEDURE:     EGD with gastric biopsy  INDICATIONS:      Chest and epigastric pain; nausea and vomiting in the setting of acute renal for  INFORMED CONSENT:   The risks, benefits, limitations, alternatives and imponderables have been discussed.  The potential for biopsy, esophogeal dilation, etc. have also been reviewed.  Questions have been answered.  All parties agreeable.  Please see the history and physical in the medical record for more information.  MEDICATIONS:  Versed 5 mg IV and Demerol 75 mg IV in divided doses. Cetacaine spray. Zofran 4 mg IV  DESCRIPTION OF PROCEDURE:   The [id number]) and [id number])  endoscope was introduced through the mouth and advanced to the second portion of the duodenum without difficulty or limitations.  The mucosal surfaces were surveyed very carefully during advancement of the scope and upon withdrawal.  Retroflexion view of the proximal stomach and esophagogastric junction was performed.      FINDINGS:  Somewhat dilated and baggy tubular esophagus. It was patent throughout its length. Mucosa entirely normal. Stomach slightly dilated with a large amount of fluid which was easily suctioned out. Small hiatal hernia. 3 mm antral ulcer present. This appeared to be a benign lesion.  Small hiatal hernia. Patent pylorus. Stomach slightly dilated ; the adult gastroscope would not reach beyond the junction of the first and second portion of duodenum. I subsequently obtained  the pediatric colonoscope and easily advanced the scope well into the second portion of the duodenum. The patient had what appeared to be an ulcer scar in  the posterior bulb also there was a prominent nodule 8-9 mm in diameter at the junction of the first and second portion of the duodenum. Also, the ampulla of vater was prominent but otherwise appeared normal.  THERAPEUTIC / DIAGNOSTIC MANEUVERS PERFORMED:  Biopsies the gastric ulcer taken for histologic study   COMPLICATIONS:  None  IMPRESSION:  Somewhat dilated baggy but otherwise a normal-appearing esophagus. Small hiatal hernia. Gastric ulcer -S/P biopsy. Duodenal bulbar ulcer scar. Prominent nodule at junction of D1 and D2  - Possibly a large minor papilla versus pancreatic rest.  RECOMMENDATIONS:  Continue twice a day PPI. Continue Carafate. Roosevelt add low-dose IV Reglan x8 doses to regimen as renal failure may have precipitated symptomatic diabetic gastroparesis. Discussed risks and benefits of this approach with spouse. Audley need to watch closely for side effects. Followup on pathology. Consider endoscopic ultrasound of duodenal nodule at a later date. Resume heparin tomorrow.      _______________________________ R. Garfield Cornea, MD FACP [facility] eSigned:  R. Garfield Cornea, MD FACP [facility] 05/24/2012 10:12 AM Revised: 05/24/2012 10:12 AM    CC:  PATIENT NAME:  Alan [name], Alan [name] MR#: [id number]

## 2012-05-25 ENCOUNTER — Encounter: Payer: Self-pay | Admitting: Gastroenterology

## 2012-05-25 DIAGNOSIS — K259 Gastric ulcer, unspecified as acute or chronic, without hemorrhage or perforation: Secondary | ICD-10-CM

## 2012-05-25 LAB — CBC
Hemoglobin: 8.6 g/dL — ABNORMAL LOW (ref 13.0–17.0)
MCH: 28.7 pg (ref 26.0–34.0)
MCV: 87.3 fL (ref 78.0–100.0)
Platelets: 160 10*3/uL (ref 150–400)
RBC: 3 MIL/uL — ABNORMAL LOW (ref 4.22–5.81)

## 2012-05-25 LAB — COMPREHENSIVE METABOLIC PANEL
ALT: 9 U/L (ref 0–53)
AST: 21 U/L (ref 0–37)
Albumin: 2.2 g/dL — ABNORMAL LOW (ref 3.5–5.2)
Chloride: 98 mEq/L (ref 96–112)
Creatinine, Ser: 7.15 mg/dL — ABNORMAL HIGH (ref 0.50–1.35)
Sodium: 139 mEq/L (ref 135–145)
Total Bilirubin: 0.2 mg/dL — ABNORMAL LOW (ref 0.3–1.2)

## 2012-05-25 LAB — GLUCOSE, CAPILLARY: Glucose-Capillary: 164 mg/dL — ABNORMAL HIGH (ref 70–99)

## 2012-05-25 MED ORDER — HEPARIN SODIUM (PORCINE) 1000 UNIT/ML DIALYSIS
20.0000 [IU]/kg | INTRAMUSCULAR | Status: DC | PRN
  Administered 2012-05-25: 1700 [IU] via INTRAVENOUS_CENTRAL
  Filled 2012-05-25: qty 2

## 2012-05-25 MED ORDER — HEPARIN SODIUM (PORCINE) 1000 UNIT/ML DIALYSIS
1000.0000 [IU] | INTRAMUSCULAR | Status: DC | PRN
  Filled 2012-05-25: qty 1

## 2012-05-25 MED ORDER — LIDOCAINE-PRILOCAINE 2.5-2.5 % EX CREA: 1.0000 "application " | TOPICAL_CREAM | CUTANEOUS | Status: DC | PRN

## 2012-05-25 MED ORDER — SODIUM CHLORIDE 0.9 % IV SOLN: 100.0000 mL | INTRAVENOUS | Status: DC | PRN

## 2012-05-25 MED ORDER — LIDOCAINE HCL (PF) 1 % IJ SOLN
5.0000 mL | INTRAMUSCULAR | Status: DC | PRN
  Filled 2012-05-25: qty 5

## 2012-05-25 MED ORDER — ONDANSETRON HCL 4 MG PO TABS: 4.0000 mg | ORAL_TABLET | ORAL | Status: DC | PRN

## 2012-05-25 MED ORDER — HEPARIN SODIUM (PORCINE) 1000 UNIT/ML DIALYSIS
300.0000 [IU] | INTRAMUSCULAR | Status: DC | PRN
  Filled 2012-05-25: qty 1

## 2012-05-25 MED ORDER — SEVELAMER CARBONATE 800 MG PO TABS: 1600.0000 mg | ORAL_TABLET | Freq: Three times a day (TID) | ORAL | Status: DC

## 2012-05-25 MED ORDER — NEPRO/CARBSTEADY PO LIQD
237.0000 mL | ORAL | Status: DC | PRN
  Filled 2012-05-25: qty 237

## 2012-05-25 MED ORDER — PENTAFLUOROPROP-TETRAFLUOROETH EX AERO
1.0000 "application " | INHALATION_SPRAY | CUTANEOUS | Status: DC | PRN
  Filled 2012-05-25: qty 103.5

## 2012-05-25 MED ORDER — PANTOPRAZOLE SODIUM 40 MG PO TBEC: 40.0000 mg | DELAYED_RELEASE_TABLET | Freq: Two times a day (BID) | ORAL | Status: DC

## 2012-05-25 MED ORDER — SUCRALFATE 1 GM/10ML PO SUSP: 1.0000 g | Freq: Three times a day (TID) | ORAL | Status: DC | PRN

## 2012-05-25 MED ORDER — ALTEPLASE 2 MG IJ SOLR
2.0000 mg | Freq: Once | INTRAMUSCULAR | Status: DC | PRN
  Filled 2012-05-25: qty 2

## 2012-05-25 MED FILL — Heparin Sodium (Porcine) Inj 1000 Unit/ML: INTRAMUSCULAR | Qty: 10 | Status: AC

## 2012-05-25 MED FILL — Ondansetron HCl Inj 4 MG/2ML (2 MG/ML): INTRAMUSCULAR | Qty: 2 | Status: AC

## 2012-05-25 NOTE — Progress Notes (Signed)
Subjective: Interval History: has no complaint of difficulty in breathing. Patient says that his nausea and vomiting has improved. Presently he is able to tolerate feeding..  Objective: Vital signs in last 24 hours: Temp:  [97.8 F (36.6 C)-98.3 F (36.8 C)] 97.8 F (36.6 C) (04/07 0839) Pulse Rate:  [77-96] 82 (04/07 0839) Resp:  [12-22] 18 (04/07 0839) BP: (98-186)/(39-116) 186/69 mmHg (04/07 0839) SpO2:  [93 %-100 %] 96 % (04/07 0839) Weight:  [86.7 kg (191 lb 2.2 oz)] 86.7 kg (191 lb 2.2 oz) (04/07 0451) Weight change: 6 kg (13 lb 3.6 oz)  Intake/Output from previous day: 04/06 0701 - 04/07 0700 In: 240 [P.O.:240] Out: 750 [Urine:750] Intake/Output this shift: Total I/O In: 240 [P.O.:240] Out: -   General appearance: alert, cooperative and no distress Resp: clear to auscultation bilaterally Cardio: regular rate and rhythm, S1, S2 normal, no murmur, click, rub or gallop GI: soft, non-tender; bowel sounds normal; no masses,  no organomegaly Extremities: extremities normal, atraumatic, no cyanosis or edema  Lab Results:  Recent Labs  05/24/12 0554 05/25/12 0528  WBC 7.2 6.7  HGB 9.0* 8.6*  HCT 27.5* 26.2*  PLT 164 160   BMET:  Recent Labs  05/24/12 0554 05/25/12 0528  NA 138 139  K 3.4* 4.1  CL 94* 98  CO2 25 25  GLUCOSE 155* 155*  BUN 38* 48*  CREATININE 5.73* 7.15*  CALCIUM 8.9 8.7   No results found for this basename: PTH,  in the last 72 hours Iron Studies: No results found for this basename: IRON, TIBC, TRANSFERRIN, FERRITIN,  in the last 72 hours  Studies/Results: No results found.  I have reviewed the patient's current medications.  Assessment/Plan: Problem #1 chronic renal failure he status post hemodialysis on Saturday his BUN is 48 and creatinine 7.15 renal function remains stable. Problem #2 abdominal pain, nausea and vomiting most likely a compression of peptic ulcer disease and uremia presently is status post endoscopy which revealed  purulent gastric ulcer. Patient presently feeling better. Problem #3 hypertension his blood pressure is reasonably controlled Problem #4 anemia hemoglobin 8.6 hematocrit 6.2 slightly low presently patient is on Epogen. Problem #5 diabetes Problem #6 coronary disease : #7 peripheral blood for disease status post left BKA Problem #8 metabolic bone disease patient presently on a binder and also on Hectorol. Plan: We'll make arrangement for patient to get dialysis today We'll continue his other treatments and if patient is going to be discharged out for him as outpatient.    LOS: 6 days   Deago Burruss S 05/25/2012,9:10 AM

## 2012-05-25 NOTE — Progress Notes (Signed)
Patient PPD test negative.

## 2012-05-25 NOTE — Procedures (Signed)
4 hour hemodialysis treatment completed through right IJ catheter.  Lines were reversed due to excessively negative arterial pressure.  Ultrafiltration time was interrupted for 30 minutes due to two episodes of hypotension.  All blood was reinfused. Goal was NOT met: 2.5L ordered, 1.9L removed.  Pt was without complaint post HD.  Right IJ catheter exit site is unremarkable.  Temporary catheter in left femoral vein still needs to be removed.  Report given to Gershon Crane, RN

## 2012-05-25 NOTE — Clinical Social Work Note (Signed)
CSW notified [facility] that pt did not d/c over weekend, but Kashus d/c today. New appointment time for tomorrow given to pt for paperwork and treatment at 8:30. Updated d/c instructions. CSW faxed RN note documenting negative PPD.  Benay Pike, [place]

## 2012-05-25 NOTE — Progress Notes (Signed)
Patient scheduled for hospital follow-up in 2 weeks. Alan [name] discuss EUS at that time.

## 2012-05-25 NOTE — Discharge Summary (Addendum)
Physician Discharge Summary  Alan [name] [id number] DOB: 02/11/1941 DOA: 05/19/2012  PCP: Alan Regal, MD  Admit date: 05/19/2012 Discharge date: 05/25/2012  Time spent: Greater than 30 minutes  Recommendations for Outpatient Follow-up:  1. Followup for outpatient hemodialysis and also with nephrology, Dr Alan [name].  Discharge Diagnoses:  1. 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 2. Gastric ulcer, status post biopsy. Duodenal bulbar ulcer scar. 3. Hypertension. 4. Type 2 diabetes mellitus. 5. Status post left BKA.   Discharge Condition: Stable and improved.  Diet recommendation: Renal diet.  Filed Weights   05/23/12 1445 05/24/12 0622 05/25/12 0451  Weight: 80.7 kg (177 lb 14.6 oz) 81.784 kg (180 lb 4.8 oz) 86.7 kg (191 lb 2.2 oz)    History of present illness:  This 72 year old man presented to the hospital with symptoms of chest pain. Please see initial history as outlined below: HPI: Alan [name] is a 72 y.o. male with a past medical history of cardiac disease, diabetes, hypertension, who was in his usual state of health earlier today when he was watching television with his wife when he started having left-sided chest pain. Was 9/10 in intensity. Was a sharp pain without any radiation. Denies any palpitations. Had some nausea, which has been ongoing for 2 weeks. No fever. No chills. He says that pushing on his chest makes the pain worse. He does have a history of acid reflux disease. He had some shortness of breath earlier today, and has had a dry cough. He admits to having heart disease, for which he underwent bypass about 20 years ago and stent placement 2 years ago. His wife also tells me that about 2 weeks ago he was diagnosed with acute bronchitis and was treated with steroids. She states that he was never put on antibiotics. Ever since then, he's been  having poor oral intake. He has not been consuming any fluids. He's had dry heaves. He also had some abdominal pain once in a while, but none currently. He has also noticed decreased amount of urination, but denies any blood in the urine or burning sensation with urination. Denies any changes to his medication apart from the steroids for the bronchitis.  Hospital Course:  Patient was found to be in acute renal failure. Therefore he was seen by nephrology, Dr Alan [name]. He was initiated on hemodialysis. His chest pain was really musculoskeletal in nature with negative cardiac enzymes and clinical findings to support this. He has done well except for he had complaints of nausea and vomiting. The etiology initially was felt to be secondary to uremia, however as his renal function improved, he still continued to have symptoms. Therefore he underwent EGD by Dr. Sydell [name], a gastric ulcer was found. Biopsies of been taken. He has been started on proton pump inhibitor and Carafate. Today he is much improved with his symptoms. He did have dialysis and his creatinine has come down. He is set up for outpatient hemodialysis. He [name] followup also with Dr. Sydell [name] in the outpatient setting.  Procedures:  Hemodialysis.  EGD.  IMPRESSION: Somewhat dilated baggy but otherwise a normal-appearing  esophagus. Small hiatal hernia. Gastric ulcer -S/P biopsy. Duodenal  bulbar ulcer scar.  Prominent nodule at junction of D1 and D2 - Possibly a large minor  papilla versus pancreatic rest.  RECOMMENDATIONS: Continue twice a day PPI. Continue Carafate.  [name] add low-dose IV Reglan x8 doses to regimen as renal  failure  may have precipitated symptomatic diabetic gastroparesis.  Discussed risks and benefits of this approach with spouse. [name]  need to watch closely for side effects.  Followup on pathology.  Consider endoscopic ultrasound of duodenal nodule at a later date.  Resume heparin  tomorrow.  Consultations:  Nephrology, Dr Alan [name].  Gastroenterology, Dr. Sydell [name].  Discharge Exam: Filed Vitals:   05/24/12 1020 05/24/12 1349 05/24/12 2206 05/25/12 0451  BP: 112/61 143/65 137/69 117/48  Pulse: 78 80 85 77  Temp: 97.8 F (36.6 C) 97.9 F (36.6 C) 98.1 F (36.7 C) 98.3 F (36.8 C)  TempSrc: Oral  Oral Oral  Resp: 18 18 20 18   Height:      Weight:    86.7 kg (191 lb 2.2 oz)  SpO2: 100% 94% 98% 98%    General: He looks systemically well. Cardiovascular: Heart sounds are present and in sinus rhythm. There are no murmurs. Jugular venous pressure not elevated. Respiratory: Lung fields are clear. He is alert and orientated.  Discharge Instructions  Discharge Orders   Future Orders Complete By Expires     Diet - low sodium heart healthy  As directed     Increase activity slowly  As directed         Medication List    STOP taking these medications       enalapril 5 MG tablet  Commonly known as:  VASOTEC     methylPREDNISolone 4 MG tablet  Commonly known as:  MEDROL     multivitamins ther. w/minerals Tabs      TAKE these medications       aspirin EC 81 MG tablet  Take 81 mg by mouth every morning.     CO Q 10 PO  Take 1 tablet by mouth daily.     fluticasone 50 MCG/ACT nasal spray  Commonly known as:  FLONASE  Place 2 sprays into the nose daily.     furosemide 40 MG tablet  Commonly known as:  LASIX  Take 40 mg by mouth 2 (two) times daily.     insulin NPH 100 UNIT/ML injection  Commonly known as:  HUMULIN N,NOVOLIN N  Inject 10 Units into the skin at bedtime. Use according to sliding scale.     nitroGLYCERIN 0.4 MG SL tablet  Commonly known as:  NITROSTAT  Place 0.4 mg under the tongue every 5 (five) minutes as needed.     ondansetron 4 MG tablet  Commonly known as:  ZOFRAN  Take 1 tablet (4 mg total) by mouth every 4 (four) hours as needed for nausea.     pantoprazole 40 MG tablet  Commonly known as:  PROTONIX  Take 1 tablet  (40 mg total) by mouth 2 (two) times daily before a meal.     pravastatin 80 MG tablet  Commonly known as:  PRAVACHOL  Take 80 mg by mouth every evening.     sevelamer carbonate 800 MG tablet  Commonly known as:  RENVELA  Take 2 tablets (1,600 mg total) by mouth 3 (three) times daily with meals.     sucralfate 1 GM/10ML suspension  Commonly known as:  CARAFATE  Take 10 mLs (1 g total) by mouth 3 (three) times daily with meals as needed (heartburn or indigestion).     VITAMIN C PO  Take 1 tablet by mouth every morning.           Follow-up Information   Follow up with [facility], MD In 2 weeks. (8:30 for  paperwork and treatment)    Contact information:   [id number] [place] [address] [phone]        The results of significant diagnostics from this hospitalization (including imaging, microbiology, ancillary and laboratory) are listed below for reference.    Significant Diagnostic Studies: Ct Abdomen Pelvis Wo Contrast  05/19/2012  *RADIOLOGY REPORT*  Clinical Data: Left-sided chest pain  CT ABDOMEN AND PELVIS WITHOUT CONTRAST  Technique:  Multidetector CT imaging of the abdomen and pelvis was performed following the standard protocol without intravenous contrast.  Comparison: 10/28/2011  Findings: Linear atelectasis or scar at the left base.  No hydronephrosis.  No definite ureteral calculus.  No definite renal calculus.  Perinephric stranding is symmetrical and unchanged.  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  Calcified granulomata in the liver.  No mass.  Post cholecystectomy.  Spleen, pancreas, adrenal glands are within normal limits.  No disproportionate dilatation of bowel.  Atherosclerotic calcifications of the aorta, visceral vasculature, and iliac vessels are noted.  Appendix is not visualized.  Stable appearance of the bladder and prostate with prostate enlargement.  No free fluid.  Advanced degenerative changes within the lumbar spine.   Vacuum disc is noted at multiple levels.  No definite wedge compression deformity in the lumbar spine.  There is a left-sided wedge compression deformity of T8 which has a chronic appearance.  There is severe deformity of the right femoral head with cystic changes and collapse of the articular surface.  There is also severe irregularity of the articular surface of the acetabulum and humeral head.  Cystic changes are noted.  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  No evidence of urinary obstruction.  There is deformity of the right femoral head and hip joint as described.   Original Report Authenticated By: Marybelle Killings, M.D.    Dg Chest 2 View  05/19/2012  *RADIOLOGY REPORT*  Clinical Data: Cough, possible infiltrate  CHEST - 2 VIEW  Comparison: 10/28/2011  Findings: Cardiomediastinal silhouette is stable.  Elevation of the left hemidiaphragm.  No acute infiltrate or pulmonary edema. Dextroscoliosis lower thoracic spine.  IMPRESSION: No active disease.  Elevation of the left hemidiaphragm. Dextroscoliosis of the lower thoracic spine.   Original Report Authenticated By: Lahoma Crocker, M.D.    Ir Fluoro Guide Cv Line Right  05/22/2012  *RADIOLOGY REPORT*  Clinical data: Acute renal failure, needs access for hemodialysis  TUNNELED HEMODIALYSIS CATHETER PLACEMENT WITH ULTRASOUND AND FLUOROSCOPIC GUIDANCE:  Comparison: None  Technique: The procedure, risks, benefits, and alternatives were explained to the patient.  Questions regarding the procedure were encouraged and answered.  The patient understands and consents to the procedure.  As antibiotic prophylaxis, cefazolin 2 grams was ordered pre- procedure and administered intravenously within one hour of incision.  Patency of the right IJ vein was confirmed with ultrasound with image documentation. An  appropriate skin site was determined. Region was prepped using maximum barrier technique including cap and mask, sterile gown, sterile gloves, large sterile sheet, and Chlorhexidine   as cutaneous antisepsis. The region was infiltrated locally with 1% lidocaine.  Intravenous Fentanyl and Versed were administered as conscious sedation during continuous cardiorespiratory monitoring by the radiology RN, with a total moderate sedation time of 15 minutes.  Under real-time ultrasound guidance, the right IJ vein was accessed with a 21 gauge micropuncture needle; the needle tip within the  vein was confirmed with ultrasound image documentation.   Needle exchanged over the 018 guidewire for transitional dilator, which allowed advancement of a Benson wire into the IVC. Over this, an MPA catheter was advanced. A Hemosplit 19 hemodialysis catheter was tunneled from the right anterior chest wall approach to the right IJ dermatotomy site. The MPA catheter was exchanged over an Amplatz wire for serial vascular dilators which allow placement of a peel- away sheath, through which the catheter was advanced under intermittent fluoroscopy, positioned with its tips in the proximal and midright atrium. Spot chest radiograph confirms good catheter position. No pneumothorax. Catheter was flushed and primed per protocol. Catheter secured externally with O Prolene sutures. The right IJ   dermatotomy site was closed  with Dermabond. No immediate complication.  IMPRESSION:  1. Technically successful placement of tunneled right IJ hemodialysis catheter with ultrasound and fluoroscopic guidance. Ready for routine use.   Original Report Authenticated By: D. Wallace Going, MD    Ir US Guide Vasc Access Right  05/22/2012  *RADIOLOGY REPORT*  Clinical data: Acute renal failure, needs access for hemodialysis  TUNNELED HEMODIALYSIS CATHETER PLACEMENT WITH ULTRASOUND AND FLUOROSCOPIC GUIDANCE:  Comparison: None  Technique: The procedure, risks,  benefits, and alternatives were explained to the patient.  Questions regarding the procedure were encouraged and answered.  The patient understands and consents to the procedure.  As antibiotic prophylaxis, cefazolin 2 grams was ordered pre- procedure and administered intravenously within one hour of incision.  Patency of the right IJ vein was confirmed with ultrasound with image documentation. An appropriate skin site was determined. Region was prepped using maximum barrier technique including cap and mask, sterile gown, sterile gloves, large sterile sheet, and Chlorhexidine   as cutaneous antisepsis. The region was infiltrated locally with 1% lidocaine.  Intravenous Fentanyl and Versed were administered as conscious sedation during continuous cardiorespiratory monitoring by the radiology RN, with a total moderate sedation time of 15 minutes.  Under real-time ultrasound guidance, the right IJ vein was accessed with a 21 gauge micropuncture needle; the needle tip within the vein was confirmed with ultrasound image documentation.   Needle exchanged over the 018 guidewire for transitional dilator, which allowed advancement of a Benson wire into the IVC. Over this, an MPA catheter was advanced. A Hemosplit 19 hemodialysis catheter was tunneled from the right anterior chest wall approach to the right IJ dermatotomy site. The MPA catheter was exchanged over an Amplatz wire for serial vascular dilators which allow placement of a peel- away sheath, through which the catheter was advanced under intermittent fluoroscopy, positioned with its tips in the proximal and midright atrium. Spot chest radiograph confirms good catheter position. No pneumothorax. Catheter was flushed and primed per protocol. Catheter secured externally with O Prolene sutures. The right IJ   dermatotomy site was closed  with Dermabond. No immediate complication.  IMPRESSION:  1. Technically successful placement of tunneled right IJ hemodialysis catheter  with ultrasound and fluoroscopic guidance. Ready for routine use.   Original Report Authenticated By: D. Wallace Going, MD     Microbiology: Recent Results (from the past 240 hour(s))  URINE CULTURE     Status: None   Collection Time    05/19/12  4:56 PM      Result Value Range Status   Specimen Description URINE, CLEAN CATCH   Final   Special Requests NONE   Final   Culture  Setup Time 05/19/2012 18:52   Final   Colony Count NO GROWTH   Final  Culture NO GROWTH   Final   Report Status 05/20/2012 FINAL   Final  MRSA PCR SCREENING     Status: None   Collection Time    05/19/12 10:41 PM      Result Value Range Status   MRSA by PCR NEGATIVE  NEGATIVE Final   Comment:            The GeneXpert MRSA Assay (FDA     approved for NASAL specimens     only), is one component of a     comprehensive MRSA colonization     surveillance program. It is not     intended to diagnose MRSA     infection nor to guide or     monitor treatment for     MRSA infections.     Labs: Basic Metabolic Panel:  Recent Labs Lab 05/20/12 0527 05/20/12 0941  05/21/12 0446 05/22/12 0519 05/23/12 0622 05/24/12 0554 05/25/12 0528  NA 138  --   < > 140 141 141 138 139  K 6.0*  --   < > 4.9 4.2 4.0 3.4* 4.1  CL 100  --   < > 102 99 98 94* 98  CO2 16*  --   < > 18* 19 20 25 25   GLUCOSE 148*  --   < > 134* 181* 168* 155* 155*  BUN 175*  --   < > 134* 88* 98* 38* 48*  CREATININE 16.75*  --   < > 14.08* 9.99* 11.63* 5.73* 7.15*  CALCIUM 8.7  --   < > 8.2* 8.7 9.3 8.9 8.7  MG  --  2.8*  --   --   --   --   --   --   PHOS  --  8.3*  --   --  7.9*  --  5.6*  --   < > = values in this interval not displayed. Liver Function Tests:  Recent Labs Lab 05/19/12 1635 05/20/12 0527 05/20/12 1530 05/25/12 0528  AST 15 17  --  21  ALT 15 19 19 9   ALKPHOS 69 64  --  56  BILITOT 0.2* 0.1*  --  0.2*  PROT 7.3 6.8  --  5.7*  ALBUMIN 3.0* 2.7*  --  2.2*    Recent Labs Lab 05/19/12 1635  LIPASE 51     CBC:  Recent Labs Lab 05/19/12 1635 05/20/12 0527 05/22/12 0519 05/23/12 0622 05/24/12 0554 05/25/12 0528  WBC 9.1 8.4 10.1 8.1 7.2 6.7  NEUTROABS 7.7  --   --   --   --   --   HGB 11.7* 11.1* 10.1* 9.6* 9.0* 8.6*  HCT 33.8* 32.5* 30.4* 29.0* 27.5* 26.2*  MCV 83.9 84.0 85.9 86.3 87.9 87.3  PLT 256 253 196 179 164 160   Cardiac Enzymes:  Recent Labs Lab 05/19/12 1635 05/20/12 1129 05/21/12 2332  CKTOTAL  --  91  --   TROPONINI <0.30  --  <0.30     CBG:  Recent Labs Lab 05/24/12 0736 05/24/12 1149 05/24/12 1622 05/24/12 2110 05/25/12 0746  GLUCAP 150* 164* 145* 132* 164*       Signed:  Hazen  [facility] 05/25/2012, 8:55 AM

## 2012-05-25 NOTE — Progress Notes (Signed)
Subjective: No abdominal pain, N/V. Tolerating diet.   Objective: Vital signs in last 24 hours: Temp:  [97.8 F (36.6 C)-98.9 F (37.2 C)] 98.3 F (36.8 C) (04/07 0451) Pulse Rate:  [77-96] 77 (04/07 0451) Resp:  [12-24] 18 (04/07 0451) BP: (98-191)/(39-116) 117/48 mmHg (04/07 0451) SpO2:  [93 %-100 %] 98 % (04/07 0451) Weight:  [191 lb 2.2 oz (86.7 kg)] 191 lb 2.2 oz (86.7 kg) (04/07 0451) Last BM Date: 05/22/12 General:   Alert and oriented, pleasant Head:  Normocephalic and atraumatic. Eyes:  No icterus, sclera clear. Conjuctiva pink.  Mouth:  Without lesions, mucosa pink and moist.  Heart:  S1, S2 present, no murmurs noted.  Lungs: Clear to auscultation bilaterally, without wheezing, rales, or rhonchi.  Abdomen:  Bowel sounds present, soft, non-tender, non-distended. No HSM or hernias noted. No rebound or guarding. No masses appreciated  Msk:  Symmetrical without gross deformities. Normal posture. Extremities:  Without clubbing or edema. Neurologic:  Alert and  oriented x4;  grossly normal neurologically. Skin:  Warm and dry, intact without significant lesions.  Psych:  Alert and cooperative. Normal mood and affect.  Intake/Output from previous day: 04/06 0701 - 04/07 0700 In: 240 [P.O.:240] Out: 750 [Urine:750] Intake/Output this shift:    Lab Results:  Recent Labs  05/23/12 0622 05/24/12 0554 05/25/12 0528  WBC 8.1 7.2 6.7  HGB 9.6* 9.0* 8.6*  HCT 29.0* 27.5* 26.2*  PLT 179 164 160   BMET  Recent Labs  05/23/12 0622 05/24/12 0554 05/25/12 0528  NA 141 138 139  K 4.0 3.4* 4.1  CL 98 94* 98  CO2 20 25 25   GLUCOSE 168* 155* 155*  BUN 98* 38* 48*  CREATININE 11.63* 5.73* 7.15*  CALCIUM 9.3 8.9 8.7   LFT  Recent Labs  05/25/12 0528  PROT 5.7*  ALBUMIN 2.2*  AST 21  ALT 9  ALKPHOS 56  BILITOT 0.2*     Assessment: 72 year old male admitted with with chest pain, N/V, on dialysis. EGD this admission 4/6 with Dr. Gala Romney: dilated baggy but  otherwise normal esophagus. Gastric ulcer and duodenal bulbar ulcer scar, prominent duodenal nodule. Biopsies pending. May have element of underlying gastroparesis. Clinically improving. Hgb with mild drift since admission but likely multifactorial.       Plan: PPI BID Carafate Reglan short-term for total of 8 doses (has had total of 4 doses) EUS of duodenal nodule as outpatient Follow-up pending path Advance diet  Orvil Feil, ANP-BC [facility]    LOS: 6 days    05/25/2012, 7:44 AM

## 2012-05-25 NOTE — Progress Notes (Signed)
Prescription given states understanding of discharge.

## 2012-05-26 ENCOUNTER — Encounter (HOSPITAL_COMMUNITY): Payer: Self-pay | Admitting: Internal Medicine

## 2012-05-27 ENCOUNTER — Encounter: Payer: Self-pay | Admitting: Internal Medicine

## 2012-06-08 ENCOUNTER — Ambulatory Visit: Payer: Medicare Other | Admitting: Gastroenterology

## 2012-06-09 ENCOUNTER — Encounter: Payer: Self-pay | Admitting: Internal Medicine

## 2012-06-11 ENCOUNTER — Telehealth: Payer: Self-pay | Admitting: Gastroenterology

## 2012-06-11 ENCOUNTER — Ambulatory Visit: Payer: Medicare Other | Admitting: Gastroenterology

## 2012-06-11 NOTE — Telephone Encounter (Signed)
Pt was a no show

## 2012-06-11 NOTE — Telephone Encounter (Signed)
Mailed letter to patient to call our office to [facility] and not to neglect his health

## 2012-06-11 NOTE — Telephone Encounter (Signed)
Please send letter to reschedule.  

## 2012-07-02 ENCOUNTER — Ambulatory Visit: Payer: Self-pay | Admitting: Vascular Surgery

## 2012-07-02 LAB — BASIC METABOLIC PANEL
Anion Gap: 8 (ref 7–16)
BUN: 39 mg/dL — ABNORMAL HIGH (ref 7–18)
Chloride: 97 mmol/L — ABNORMAL LOW (ref 98–107)
Co2: 28 mmol/L (ref 21–32)
Creatinine: 5.35 mg/dL — ABNORMAL HIGH (ref 0.60–1.30)
EGFR (African American): 11 — ABNORMAL LOW
Glucose: 129 mg/dL — ABNORMAL HIGH (ref 65–99)
Potassium: 3.8 mmol/L (ref 3.5–5.1)
Sodium: 133 mmol/L — ABNORMAL LOW (ref 136–145)

## 2012-07-02 LAB — CBC
HCT: 35.8 % — ABNORMAL LOW (ref 40.0–52.0)
HGB: 11.7 g/dL — ABNORMAL LOW (ref 13.0–18.0)
MCH: 29.4 pg (ref 26.0–34.0)
MCHC: 32.7 g/dL (ref 32.0–36.0)
RDW: 16 % — ABNORMAL HIGH (ref 11.5–14.5)
WBC: 8 10*3/uL (ref 3.8–10.6)

## 2012-07-09 ENCOUNTER — Ambulatory Visit: Payer: Self-pay | Admitting: Vascular Surgery

## 2012-09-03 ENCOUNTER — Ambulatory Visit: Payer: Self-pay | Admitting: Vascular Surgery

## 2012-09-03 LAB — BASIC METABOLIC PANEL
Anion Gap: 6 — ABNORMAL LOW (ref 7–16)
BUN: 46 mg/dL — ABNORMAL HIGH (ref 7–18)
Calcium, Total: 9.4 mg/dL (ref 8.5–10.1)
Chloride: 100 mmol/L (ref 98–107)
Creatinine: 6.12 mg/dL — ABNORMAL HIGH (ref 0.60–1.30)
Osmolality: 284 (ref 275–301)
Potassium: 3.9 mmol/L (ref 3.5–5.1)
Sodium: 136 mmol/L (ref 136–145)

## 2012-10-21 ENCOUNTER — Emergency Department (HOSPITAL_COMMUNITY): Payer: Medicare Other

## 2012-10-21 ENCOUNTER — Emergency Department (HOSPITAL_COMMUNITY)
Admission: EM | Admit: 2012-10-21 | Discharge: 2012-10-21 | Disposition: A | Discharge status: Home / Self Care | Payer: Medicare Other | Attending: Emergency Medicine | Admitting: Emergency Medicine

## 2012-10-21 ENCOUNTER — Encounter (HOSPITAL_COMMUNITY): Payer: Self-pay | Admitting: *Deleted

## 2012-10-21 DIAGNOSIS — R61 Generalized hyperhidrosis: Secondary | ICD-10-CM | POA: Insufficient documentation

## 2012-10-21 DIAGNOSIS — Z8739 Personal history of other diseases of the musculoskeletal system and connective tissue: Secondary | ICD-10-CM | POA: Insufficient documentation

## 2012-10-21 DIAGNOSIS — M129 Arthropathy, unspecified: Secondary | ICD-10-CM | POA: Insufficient documentation

## 2012-10-21 DIAGNOSIS — R079 Chest pain, unspecified: Secondary | ICD-10-CM

## 2012-10-21 DIAGNOSIS — Z8673 Personal history of transient ischemic attack (TIA), and cerebral infarction without residual deficits: Secondary | ICD-10-CM | POA: Insufficient documentation

## 2012-10-21 DIAGNOSIS — R0789 Other chest pain: Secondary | ICD-10-CM | POA: Insufficient documentation

## 2012-10-21 DIAGNOSIS — I10 Essential (primary) hypertension: Secondary | ICD-10-CM | POA: Insufficient documentation

## 2012-10-21 DIAGNOSIS — J45901 Unspecified asthma with (acute) exacerbation: Secondary | ICD-10-CM | POA: Insufficient documentation

## 2012-10-21 DIAGNOSIS — Z951 Presence of aortocoronary bypass graft: Secondary | ICD-10-CM | POA: Insufficient documentation

## 2012-10-21 DIAGNOSIS — Z79899 Other long term (current) drug therapy: Secondary | ICD-10-CM | POA: Insufficient documentation

## 2012-10-21 DIAGNOSIS — I2510 Atherosclerotic heart disease of native coronary artery without angina pectoris: Secondary | ICD-10-CM | POA: Insufficient documentation

## 2012-10-21 DIAGNOSIS — Z87448 Personal history of other diseases of urinary system: Secondary | ICD-10-CM | POA: Insufficient documentation

## 2012-10-21 DIAGNOSIS — E7800 Pure hypercholesterolemia, unspecified: Secondary | ICD-10-CM | POA: Insufficient documentation

## 2012-10-21 DIAGNOSIS — E119 Type 2 diabetes mellitus without complications: Secondary | ICD-10-CM | POA: Insufficient documentation

## 2012-10-21 DIAGNOSIS — Z7982 Long term (current) use of aspirin: Secondary | ICD-10-CM | POA: Insufficient documentation

## 2012-10-21 LAB — BASIC METABOLIC PANEL
BUN: 28 mg/dL — ABNORMAL HIGH (ref 6–23)
Creatinine, Ser: 4.27 mg/dL — ABNORMAL HIGH (ref 0.50–1.35)
GFR calc Af Amer: 15 mL/min — ABNORMAL LOW (ref >90)
GFR calc non Af Amer: 13 mL/min — ABNORMAL LOW (ref >90)

## 2012-10-21 LAB — CBC
HCT: 30.7 % — ABNORMAL LOW (ref 39.0–52.0)
MCH: 29.6 pg (ref 26.0–34.0)
MCHC: 33.6 g/dL (ref 30.0–36.0)
MCV: 88.2 fL (ref 78.0–100.0)
RDW: 13.8 % (ref 11.5–15.5)

## 2012-10-21 LAB — TROPONIN I: Troponin I: <0.3 ng/mL (ref <0.30)

## 2012-10-21 MED ORDER — HYDROCODONE-ACETAMINOPHEN 5-325 MG PO TABS: 1.0000 | ORAL_TABLET | Freq: Four times a day (QID) | ORAL | Status: DC | PRN

## 2012-10-21 MED ORDER — TECHNETIUM TO 99M ALBUMIN AGGREGATED
6.0000 | Freq: Once | INTRAVENOUS | Status: AC | PRN
  Administered 2012-10-21: 6 via INTRAVENOUS

## 2012-10-21 MED ORDER — TECHNETIUM TC 99M DIETHYLENETRIAME-PENTAACETIC ACID
40.0000 | Freq: Once | INTRAVENOUS | Status: AC | PRN
  Administered 2012-10-21: 42 via INTRAVENOUS

## 2012-10-21 NOTE — ED Provider Notes (Signed)
CSN: [id number]     Arrival date & time 10/21/12  1100 History  This chart was scribed for Alan Diego, MD by Roxan Diesel, ED scribe.  This patient was seen in room APA02/APA02 and the patient's care was started at 11:26 AM.    Chief Complaint  Patient presents with  . Chest Pain    Patient is a 72 y.o. male presenting with chest pain. The history is provided by the patient. No language interpreter was used.  Chest Pain Pain location:  L chest Pain quality comment:  "like someone was sitting on my chest." Pain radiates to:  Does not radiate Onset quality:  Sudden Timing:  Constant Progression:  Partially resolved Chronicity:  New Context comment:  Dialysis Relieved by:  Nothing Worsened by:  Nothing tried Associated symptoms: diaphoresis and shortness of breath   Associated symptoms: no abdominal pain, no back pain, no cough, no fatigue and no headache   Risk factors: coronary artery disease, diabetes mellitus, high cholesterol, hypertension, male sex and surgery (heart transplant in 2003)     HPI Comments: Alan [name] is a 71 y.o. male with h/o CAD, coronary artery bypass graft, DM, HTN, hypercholesteremia, renal insufficiency, and stroke who presents to the Emergency Department complaining of sudden-onset left-sided CP that began earlier today while pt was 2.5 hours into dialysis, with associated SOB and diaphoresis.  Pain is described as "like someone was sitting on my chest."  Both CP and SOB have resolved partially but are still present.  Pt states he has experienced similar CP in shorter milder episodes before but does not know what caused them. He has been on dialysis for 2 months and denies experiencing associated CP before.  He states he is still producing a small amount of urine.   Past Medical History  Diagnosis Date  . Diabetes mellitus   . Hypertension   . Gout   . Coronary artery disease   . Stroke   . Asthma   . Renal insufficiency   .  Hypercholesteremia   . Arthritis   . DDD (degenerative disc disease), lumbar   . DDD (degenerative disc disease), cervical     Past Surgical History  Procedure Laterality Date  . Coronary artery bypass graft    . Below knee leg amputation Left   . Esophagogastroduodenoscopy Left 05/24/2012    [id number] dilated baggy but otherwise a normal/ Small hiatal hernia. Gastric ulcer -S/P biopsy    Family History  Problem Relation Age of Onset  . Colon cancer Neg Hx     History  Substance Use Topics  . Smoking status: Never Smoker   . Smokeless tobacco: Not on file  . Alcohol Use: No     Review of Systems  Constitutional: Positive for diaphoresis. Negative for appetite change and fatigue.  HENT: Negative for congestion, sinus pressure and ear discharge.   Eyes: Negative for discharge.  Respiratory: Positive for shortness of breath. Negative for cough.   Cardiovascular: Positive for chest pain.  Gastrointestinal: Negative for abdominal pain and diarrhea.  Genitourinary: Negative for frequency and hematuria.  Musculoskeletal: Negative for back pain.  Skin: Negative for rash.  Neurological: Negative for seizures and headaches.  Psychiatric/Behavioral: Negative for hallucinations.     Allergies  Codeine and Yellow jacket venom  Home Medications   Current Outpatient Rx  Name  Route  Sig  Dispense  Refill  . Ascorbic Acid (VITAMIN C PO)   Oral   Take 1 tablet by mouth every  morning.          Marland Kitchen aspirin EC 81 MG tablet   Oral   Take 81 mg by mouth every morning.         . Coenzyme Q10 (CO Q 10 PO)   Oral   Take 1 tablet by mouth daily.         . fluticasone (FLONASE) 50 MCG/ACT nasal spray   Nasal   Place 2 sprays into the nose daily.         . furosemide (LASIX) 40 MG tablet   Oral   Take 40 mg by mouth 2 (two) times daily.          Marland Kitchen HYDROcodone-acetaminophen (NORCO/VICODIN) 5-325 MG per tablet   Oral   Take 1 tablet by mouth every 4 (four) hours as  needed for pain.         Marland Kitchen insulin NPH (HUMULIN N,NOVOLIN N) 100 UNIT/ML injection   Subcutaneous   Inject 10-18 Units into the skin at bedtime. Use according to sliding scale.         . nitroGLYCERIN (NITROSTAT) 0.4 MG SL tablet   Sublingual   Place 0.4 mg under the tongue every 5 (five) minutes as needed.         . ondansetron (ZOFRAN) 4 MG tablet   Oral   Take 1 tablet (4 mg total) by mouth every 4 (four) hours as needed for nausea.   20 tablet   0   . pantoprazole (PROTONIX) 40 MG tablet   Oral   Take 1 tablet (40 mg total) by mouth 2 (two) times daily before a meal.   60 tablet   0   . pravastatin (PRAVACHOL) 80 MG tablet   Oral   Take 80 mg by mouth every evening.          . sevelamer carbonate (RENVELA) 800 MG tablet   Oral   Take 800-1,600 mg by mouth See admin instructions. Take 2 tablets by mouth three times daily and 1 tablet with snacks.         . sucralfate (CARAFATE) 1 GM/10ML suspension   Oral   Take 10 mLs (1 g total) by mouth 3 (three) times daily with meals as needed (heartburn or indigestion).   420 mL   0    BP 131/62  Pulse 80  Temp(Src) 97.8 F (36.6 C) (Oral)  Resp 28  SpO2 100%  Physical Exam  Nursing note and vitals reviewed. Constitutional: He is oriented to person, place, and time. He appears well-developed.  HENT:  Head: Normocephalic.  Eyes: Conjunctivae and EOM are normal. No scleral icterus.  Neck: Neck supple. No thyromegaly present.  Cardiovascular: Normal rate and regular rhythm.  Exam reveals no gallop and no friction rub.   No murmur heard. Pulmonary/Chest: No stridor. He has no wheezes. He has no rales. He exhibits no tenderness.  Abdominal: He exhibits no distension. There is no tenderness. There is no rebound.  Musculoskeletal: Normal range of motion. He exhibits no edema.  Left BKA  Lymphadenopathy:    He has no cervical adenopathy.  Neurological: He is oriented to person, place, and time. Coordination  normal.  Skin: No rash noted. No erythema.  Psychiatric: He has a normal mood and affect. His behavior is normal.    ED Course  Procedures (including critical care time)  DIAGNOSTIC STUDIES: Oxygen Saturation is 100% on room air, normal by my interpretation.    COORDINATION OF CARE: 11:29 AM-Discussed treatment plan  which includes cardiac workup with pt at bedside and pt agreed to plan.    Labs Review Labs Reviewed  CBC - Abnormal; Notable for the following:    RBC 3.48 (*)    Hemoglobin 10.3 (*)    HCT 30.7 (*)    All other components within normal limits  BASIC METABOLIC PANEL - Abnormal; Notable for the following:    Glucose, Bld 191 (*)    BUN 28 (*)    Creatinine, Ser 4.27 (*)    GFR calc non Af Amer 13 (*)    GFR calc Af Amer 15 (*)    All other components within normal limits  PRO B NATRIURETIC PEPTIDE - Abnormal; Notable for the following:    Pro B Natriuretic peptide (BNP) 875.7 (*)    All other components within normal limits  D-DIMER, QUANTITATIVE - Abnormal; Notable for the following:    D-Dimer, Quant 4.87 (*)    All other components within normal limits  TROPONIN I    Imaging Review Dg Chest Port 1 View  10/21/2012   *RADIOLOGY REPORT*  Clinical Data: Chest pain.  High blood pressure.  Asthma.  PORTABLE CHEST - 1 VIEW  Comparison: 05/19/2012.  Findings: Right central line tip distal superior vena cava level.  No gross pneumothorax.  Left base opacity may represent elevated left hemidiaphragm combined with atelectasis although limited for excluding underlying infiltrate or mass.  Cardiac silhouette difficult to adequately assessed.  Post CABG.  Tortuous aorta.  Central pulmonary vascular prominence.  The patient would eventually benefit from follow-up two-view chest with better inspiration.  Bilateral shoulder joint degenerative changes greater on the left.  IMPRESSION: Right central line tip distal superior vena cava level.  No gross pneumothorax.  Left base  opacity may represent elevated left hemidiaphragm combined with atelectasis although limited for excluding underlying infiltrate or mass.  Cardiac silhouette difficult to adequately assessed.  Post CABG.  Tortuous aorta.  Central pulmonary vascular prominence.  The patient would eventually benefit from follow-up two-view chest with better inspiration.   Original Report Authenticated By: Genia Del, M.D.    Date: 10/21/2012  Rate:85  Rhythm: normal sinus rhythm  QRS Axis: normal  Intervals: normal  ST/T Wave abnormalities: normal  Poor r wave progression  Conduction Disutrbances:none  Narrative Interpretation:   Old EKG Reviewed: unchanged   MDM  No diagnosis found.      The chart was scribed for me under my direct supervision.  I personally performed the history, physical, and medical decision making and all procedures in the evaluation of this patient.Alan Diego, MD 10/21/12 [id number]

## 2012-10-21 NOTE — ED Notes (Signed)
Pt unable to do ct chest. Must have NM study. Dr Roderic Palau aware.

## 2012-10-21 NOTE — ED Notes (Signed)
Sudden onset sharp left sided cp started approx 2.5 hrs into dialysis treatment.  Given nitro x 2 while at dialysis, nitro x 1 given by EMS en route.  Pt took ASA x 2 before dialysis, EMS gave ASA x 2  En route.  CBG 211 in route.  C/o sob.  Denies dizziness/n/v.

## 2012-11-26 ENCOUNTER — Ambulatory Visit: Payer: Self-pay | Admitting: Vascular Surgery

## 2013-01-26 ENCOUNTER — Encounter (HOSPITAL_COMMUNITY): Payer: Self-pay | Admitting: Emergency Medicine

## 2013-01-26 ENCOUNTER — Emergency Department (HOSPITAL_COMMUNITY)
Admission: EM | Admit: 2013-01-26 | Discharge: 2013-01-26 | Disposition: A | Discharge status: Home / Self Care | Payer: Medicare Other | Attending: Emergency Medicine | Admitting: Emergency Medicine

## 2013-01-26 ENCOUNTER — Emergency Department (HOSPITAL_COMMUNITY): Payer: Medicare Other

## 2013-01-26 DIAGNOSIS — I739 Peripheral vascular disease, unspecified: Secondary | ICD-10-CM | POA: Insufficient documentation

## 2013-01-26 DIAGNOSIS — Z951 Presence of aortocoronary bypass graft: Secondary | ICD-10-CM | POA: Insufficient documentation

## 2013-01-26 DIAGNOSIS — L988 Other specified disorders of the skin and subcutaneous tissue: Secondary | ICD-10-CM | POA: Insufficient documentation

## 2013-01-26 DIAGNOSIS — IMO0002 Reserved for concepts with insufficient information to code with codable children: Secondary | ICD-10-CM | POA: Insufficient documentation

## 2013-01-26 DIAGNOSIS — Z8673 Personal history of transient ischemic attack (TIA), and cerebral infarction without residual deficits: Secondary | ICD-10-CM | POA: Insufficient documentation

## 2013-01-26 DIAGNOSIS — J45909 Unspecified asthma, uncomplicated: Secondary | ICD-10-CM | POA: Insufficient documentation

## 2013-01-26 DIAGNOSIS — I10 Essential (primary) hypertension: Secondary | ICD-10-CM | POA: Insufficient documentation

## 2013-01-26 DIAGNOSIS — I2510 Atherosclerotic heart disease of native coronary artery without angina pectoris: Secondary | ICD-10-CM | POA: Insufficient documentation

## 2013-01-26 DIAGNOSIS — M129 Arthropathy, unspecified: Secondary | ICD-10-CM | POA: Insufficient documentation

## 2013-01-26 DIAGNOSIS — Z87448 Personal history of other diseases of urinary system: Secondary | ICD-10-CM | POA: Insufficient documentation

## 2013-01-26 DIAGNOSIS — E7800 Pure hypercholesterolemia, unspecified: Secondary | ICD-10-CM | POA: Insufficient documentation

## 2013-01-26 DIAGNOSIS — S90424A Blister (nonthermal), right lesser toe(s), initial encounter: Secondary | ICD-10-CM

## 2013-01-26 DIAGNOSIS — Z7982 Long term (current) use of aspirin: Secondary | ICD-10-CM | POA: Insufficient documentation

## 2013-01-26 DIAGNOSIS — E119 Type 2 diabetes mellitus without complications: Secondary | ICD-10-CM | POA: Insufficient documentation

## 2013-01-26 DIAGNOSIS — Z79899 Other long term (current) drug therapy: Secondary | ICD-10-CM | POA: Insufficient documentation

## 2013-01-26 DIAGNOSIS — Z794 Long term (current) use of insulin: Secondary | ICD-10-CM | POA: Insufficient documentation

## 2013-01-26 LAB — CBC WITH DIFFERENTIAL/PLATELET
Basophils Relative: 0 % (ref 0–1)
HCT: 41.6 % (ref 39.0–52.0)
Hemoglobin: 13.4 g/dL (ref 13.0–17.0)
MCH: 30.3 pg (ref 26.0–34.0)
MCHC: 32.2 g/dL (ref 30.0–36.0)
Monocytes Absolute: 0.5 10*3/uL (ref 0.1–1.0)
Monocytes Relative: 7 % (ref 3–12)
Neutro Abs: 4.7 10*3/uL (ref 1.7–7.7)

## 2013-01-26 LAB — BASIC METABOLIC PANEL
BUN: 45 mg/dL — ABNORMAL HIGH (ref 6–23)
Chloride: 94 mEq/L — ABNORMAL LOW (ref 96–112)
Creatinine, Ser: 6.45 mg/dL — ABNORMAL HIGH (ref 0.50–1.35)
GFR calc Af Amer: 9 mL/min — ABNORMAL LOW (ref >90)
Glucose, Bld: 152 mg/dL — ABNORMAL HIGH (ref 70–99)

## 2013-01-26 LAB — GLUCOSE, CAPILLARY: Glucose-Capillary: 125 mg/dL — ABNORMAL HIGH (ref 70–99)

## 2013-01-26 NOTE — ED Notes (Signed)
Doppler used to palpate pulse to rt foot.  Pulses present

## 2013-01-26 NOTE — ED Provider Notes (Signed)
CSN: [id number]     Arrival date & time 01/26/13  1021 History  This chart was scribed for Richarda Blade, MD by Eugenia Mcalpine, ED Scribe. This patient was seen in room APA15/APA15 and the patient's care was started at 1:38 PM.    Chief Complaint  Patient presents with  . Blister   (Consider location/radiation/quality/duration/timing/severity/associated sxs/prior Treatment) The history is provided by the patient and the spouse. No language interpreter was used.   HPI Comments: Alan [name] is a 72 y.o. male hx of DM and CAD who presents to the Emergency Department complaining of a right foot blister to his right great toe. Pt states that he cannot feel his right foot. He denies fever, and vomiting. Pt is not ambulatory, he has to use a motorized wheel chair. Pt has a left BKA. He does not appear to be in any acute distress with no other complaints.  Past Medical History  Diagnosis Date  . Diabetes mellitus   . Hypertension   . Gout   . Coronary artery disease   . Stroke   . Asthma   . Renal insufficiency   . Hypercholesteremia   . Arthritis   . DDD (degenerative disc disease), lumbar   . DDD (degenerative disc disease), cervical    Past Surgical History  Procedure Laterality Date  . Coronary artery bypass graft    . Below knee leg amputation Left   . Esophagogastroduodenoscopy Left 05/24/2012    [id number] dilated baggy but otherwise a normal/ Small hiatal hernia. Gastric ulcer -S/P biopsy   Family History  Problem Relation Age of Onset  . Colon cancer Neg Hx    History  Substance Use Topics  . Smoking status: Never Smoker   . Smokeless tobacco: Not on file  . Alcohol Use: No    Review of Systems  Constitutional: Negative for fever and chills.  Cardiovascular: Negative for leg swelling.  Gastrointestinal: Negative for nausea and vomiting.  Musculoskeletal: Negative for arthralgias.  Skin: Positive for wound.  All other systems reviewed and are  negative.    Allergies  Codeine and Yellow jacket venom  Home Medications   Current Outpatient Rx  Name  Route  Sig  Dispense  Refill  . Ascorbic Acid (VITAMIN C PO)   Oral   Take 1 tablet by mouth every morning.          Marland Kitchen aspirin EC 81 MG tablet   Oral   Take 81 mg by mouth every morning.         . Coenzyme Q10 (CO Q 10 PO)   Oral   Take 1 tablet by mouth daily.         . fluticasone (FLONASE) 50 MCG/ACT nasal spray   Nasal   Place 2 sprays into the nose daily.         . furosemide (LASIX) 40 MG tablet   Oral   Take 40 mg by mouth 2 (two) times daily.          Marland Kitchen HYDROcodone-acetaminophen (NORCO/VICODIN) 5-325 MG per tablet   Oral   Take 1 tablet by mouth every 4 (four) hours as needed for pain.         Marland Kitchen HYDROcodone-acetaminophen (NORCO/VICODIN) 5-325 MG per tablet   Oral   Take 1 tablet by mouth every 6 (six) hours as needed for pain.   20 tablet   0   . insulin NPH (HUMULIN N,NOVOLIN N) 100 UNIT/ML injection   Subcutaneous  Inject 10-18 Units into the skin at bedtime. Use according to sliding scale.         . nitroGLYCERIN (NITROSTAT) 0.4 MG SL tablet   Sublingual   Place 0.4 mg under the tongue every 5 (five) minutes as needed.         . ondansetron (ZOFRAN) 4 MG tablet   Oral   Take 1 tablet (4 mg total) by mouth every 4 (four) hours as needed for nausea.   20 tablet   0   . pantoprazole (PROTONIX) 40 MG tablet   Oral   Take 1 tablet (40 mg total) by mouth 2 (two) times daily before a meal.   60 tablet   0   . pravastatin (PRAVACHOL) 80 MG tablet   Oral   Take 80 mg by mouth every evening.          . sevelamer carbonate (RENVELA) 800 MG tablet   Oral   Take 800-1,600 mg by mouth See admin instructions. Take 2 tablets by mouth three times daily and 1 tablet with snacks.         . sucralfate (CARAFATE) 1 GM/10ML suspension   Oral   Take 10 mLs (1 g total) by mouth 3 (three) times daily with meals as needed (heartburn or  indigestion).   420 mL   0    BP 170/68  Pulse 72  Temp(Src) 97.4 F (36.3 C) (Oral)  Resp 20  Ht 5\' 9"  (1.753 m)  Wt 159 lb (72.122 kg)  BMI 23.47 kg/m2  SpO2 100% Physical Exam  Nursing note and vitals reviewed. Constitutional: He is oriented to person, place, and time. He appears well-developed and well-nourished.  HENT:  Head: Normocephalic and atraumatic.  Right Ear: External ear normal.  Left Ear: External ear normal.  Eyes: Conjunctivae and EOM are normal. Pupils are equal, round, and reactive to light.  Neck: Normal range of motion and phonation normal. Neck supple.  Cardiovascular: Normal rate, regular rhythm, normal heart sounds and intact distal pulses.   Cap. Refill <3s in right great toe. Biphasic dorsalis pedis pulse and Monophasic posterior tibial pulse with the doppler.  Pulmonary/Chest: Effort normal and breath sounds normal. He exhibits no bony tenderness.  Abdominal: Soft. Normal appearance. There is tenderness.  Mild RLQ pain  Musculoskeletal: Normal range of motion.  Slight purpleish hue to right great toe with large blister that has erupted to plantar aspect of right great toe with dried blood that does not hurt to touch. Partial nail is present in nail bed. Foot is not swollen, no erythema, no streaking.   Neurological: He is alert and oriented to person, place, and time. No cranial nerve deficit or sensory deficit. He exhibits normal muscle tone. Coordination normal.  Skin: Skin is warm, dry and intact.  Psychiatric: He has a normal mood and affect. His behavior is normal. Judgment and thought content normal.    ED Course  Procedures (including critical care time  DIAGNOSTIC STUDIES: Oxygen Saturation is 100% on RA, normal by my interpretation.    COORDINATION OF CARE: 1:45 PM- Pt advised of plan for treatment including blood count and X-ray and pt agrees.  2:39 PM PROCEDURE DEBRIDEMENT: nonviable skin to great right toe. Skin removed with scissors.  Base of the wound appears to have normal viable tissue. There is no evidence for deep infection, proximal spreading infection or evidence for ischemic digit. Wound was cleansed then dressed.   3:24 PM upon recheck pt is now known to  be on dialysis. His CBC looks good. But he must follow up with his PCP.  He is to soak with warm water and soap 2x a day, bandage the affected area and it should get better. He should see his PCP in at least 1x week.  He is to be mindful of hitting the toe or brushing it up against anything.  Pt's wife is informed of the pt's mild reduced blood flow.   Labs Review Labs Reviewed - No data to display Imaging Review No results found.  EKG Interpretation   None       MDM  No diagnosis found.   Posterior right great toe, cause unclear. Doubt diabetic foot infection, acute arterial insufficiency of the right great toe or suspected systemic infection or metabolic instability. He stable for discharge, with symptomatic treatment, and close followup with his primary care provider.   Nursing Notes Reviewed/ Care Coordinated, and agree without changes. Applicable Imaging Reviewed.  Interpretation of Laboratory Data incorporated into ED treatment    Plan: Home Medications- usual; Home Treatments and Observation- warm soaks 2 times a day with cleansing and bandaging, with close observation for progressive symptoms; return here if the recommended treatment, does not improve the symptoms; Recommended follow up- PCP followup in 3 days for checkup, sooner if needed     I personally performed the services described in this documentation, which was scribed in my presence. The recorded information has been reviewed and is accurate.      Richarda Blade, MD 01/26/13 2135

## 2013-01-26 NOTE — ED Notes (Signed)
Patient's great toe cleansed with sur cleans and sterile water. Toe dried well and dressed with telfa and tube gauze.

## 2013-01-26 NOTE — ED Notes (Signed)
Pt states that he noticed a blister last night on right great toe that had busted last night, concerned because he is diabetic, toe has small amount of bleeding noted when bandage removed, denies any pain at present,

## 2013-02-12 ENCOUNTER — Emergency Department (HOSPITAL_COMMUNITY)
Admission: EM | Admit: 2013-02-12 | Discharge: 2013-02-13 | Disposition: A | Discharge status: Home / Self Care | Payer: Medicare Other | Attending: Emergency Medicine | Admitting: Emergency Medicine

## 2013-02-12 ENCOUNTER — Encounter (HOSPITAL_COMMUNITY): Payer: Self-pay | Admitting: Emergency Medicine

## 2013-02-12 ENCOUNTER — Emergency Department (HOSPITAL_COMMUNITY): Payer: Medicare Other

## 2013-02-12 DIAGNOSIS — N186 End stage renal disease: Secondary | ICD-10-CM | POA: Insufficient documentation

## 2013-02-12 DIAGNOSIS — T829XXA Unspecified complication of cardiac and vascular prosthetic device, implant and graft, initial encounter: Secondary | ICD-10-CM

## 2013-02-12 DIAGNOSIS — E119 Type 2 diabetes mellitus without complications: Secondary | ICD-10-CM | POA: Insufficient documentation

## 2013-02-12 DIAGNOSIS — Z794 Long term (current) use of insulin: Secondary | ICD-10-CM | POA: Insufficient documentation

## 2013-02-12 DIAGNOSIS — Z792 Long term (current) use of antibiotics: Secondary | ICD-10-CM | POA: Insufficient documentation

## 2013-02-12 DIAGNOSIS — IMO0002 Reserved for concepts with insufficient information to code with codable children: Secondary | ICD-10-CM | POA: Insufficient documentation

## 2013-02-12 DIAGNOSIS — Z992 Dependence on renal dialysis: Secondary | ICD-10-CM | POA: Insufficient documentation

## 2013-02-12 DIAGNOSIS — Y838 Other surgical procedures as the cause of abnormal reaction of the patient, or of later complication, without mention of misadventure at the time of the procedure: Secondary | ICD-10-CM | POA: Insufficient documentation

## 2013-02-12 DIAGNOSIS — E7800 Pure hypercholesterolemia, unspecified: Secondary | ICD-10-CM | POA: Insufficient documentation

## 2013-02-12 DIAGNOSIS — Z8673 Personal history of transient ischemic attack (TIA), and cerebral infarction without residual deficits: Secondary | ICD-10-CM | POA: Insufficient documentation

## 2013-02-12 DIAGNOSIS — T82898A Other specified complication of vascular prosthetic devices, implants and grafts, initial encounter: Secondary | ICD-10-CM | POA: Insufficient documentation

## 2013-02-12 DIAGNOSIS — I2510 Atherosclerotic heart disease of native coronary artery without angina pectoris: Secondary | ICD-10-CM | POA: Insufficient documentation

## 2013-02-12 DIAGNOSIS — Z951 Presence of aortocoronary bypass graft: Secondary | ICD-10-CM | POA: Insufficient documentation

## 2013-02-12 DIAGNOSIS — I120 Hypertensive chronic kidney disease with stage 5 chronic kidney disease or end stage renal disease: Secondary | ICD-10-CM | POA: Insufficient documentation

## 2013-02-12 DIAGNOSIS — J45909 Unspecified asthma, uncomplicated: Secondary | ICD-10-CM | POA: Insufficient documentation

## 2013-02-12 DIAGNOSIS — M109 Gout, unspecified: Secondary | ICD-10-CM | POA: Insufficient documentation

## 2013-02-12 DIAGNOSIS — Z7982 Long term (current) use of aspirin: Secondary | ICD-10-CM | POA: Insufficient documentation

## 2013-02-12 LAB — CBC
HCT: 36.1 % — ABNORMAL LOW (ref 39.0–52.0)
Hemoglobin: 12.1 g/dL — ABNORMAL LOW (ref 13.0–17.0)
MCH: 30.1 pg (ref 26.0–34.0)
MCHC: 33.5 g/dL (ref 30.0–36.0)
MCV: 89.8 fL (ref 78.0–100.0)
Platelets: 232 10*3/uL (ref 150–400)
RBC: 4.02 MIL/uL — ABNORMAL LOW (ref 4.22–5.81)
RDW: 13.3 % (ref 11.5–15.5)
WBC: 9.5 10*3/uL (ref 4.0–10.5)

## 2013-02-12 LAB — BASIC METABOLIC PANEL
BUN: 73 mg/dL — ABNORMAL HIGH (ref 6–23)
CO2: 26 mEq/L (ref 19–32)
Chloride: 94 mEq/L — ABNORMAL LOW (ref 96–112)
GFR calc Af Amer: 7 mL/min — ABNORMAL LOW (ref >90)
Potassium: 4.3 mEq/L (ref 3.5–5.1)
Sodium: 135 mEq/L (ref 135–145)

## 2013-02-12 LAB — BASIC METABOLIC PANEL WITH GFR
Calcium: 8.8 mg/dL (ref 8.4–10.5)
Creatinine, Ser: 8.02 mg/dL — ABNORMAL HIGH (ref 0.50–1.35)
GFR calc non Af Amer: 6 mL/min — ABNORMAL LOW (ref >90)
Glucose, Bld: 70 mg/dL (ref 70–99)

## 2013-02-12 NOTE — Discharge Instructions (Signed)
 PLEASE GO TO YOUR USUAL DIALYSIS CENTER IN [place] FIRST THING IN THE MORNING.  SEE IF THEY CAN ACCESS YOUR DIALYSIS SITE.  IF THEY ARE NOT ABLE TO, THEY NEED TO CONTACT DR. TERISA TO MAKE ARRANGEMENTS TO PLACE A TEMPORARY DIALYSIS CATHETER, POSSIBLY WITH INTERVENTIONAL RADIOLOGY TO RECEIVE DIALYSIS.  YOU CAN THEN FOLLOW UP WITH DR. MAREA IN [place] EARLY NEXT WEEK

## 2013-02-12 NOTE — ED Notes (Signed)
MD at bedside. 

## 2013-02-12 NOTE — ED Notes (Signed)
Pt here from the dialysis center. They were unable to do dialysis today due to the access clotting , pt had dialysis last this past tuesday

## 2013-02-12 NOTE — ED Provider Notes (Signed)
CSN: [id number]     Arrival date & time 02/12/13  1522 History   First MD Initiated Contact with Patient 02/12/13 2122     Chief Complaint  Patient presents with  . Vascular Access Problem   (Consider location/radiation/quality/duration/timing/severity/associated sxs/prior Treatment) HPI Comments: Pt went to dialysis center this AM, unable to access his fistula and was unable to accomplish dialysis today.  He was told to go to vascular and vein clinic, but they had already closed, and then was directed to the ED here.  Pt has no complaints, no SOB, no CP, SOB, palpitations.    The history is provided by the patient and a relative.    Past Medical History  Diagnosis Date  . Diabetes mellitus   . Hypertension   . Gout   . Coronary artery disease   . Stroke   . Asthma   . Renal insufficiency   . Hypercholesteremia   . Arthritis   . DDD (degenerative disc disease), lumbar   . DDD (degenerative disc disease), cervical    Past Surgical History  Procedure Laterality Date  . Coronary artery bypass graft    . Below knee leg amputation Left   . Esophagogastroduodenoscopy Left 05/24/2012    [id number] dilated baggy but otherwise a normal/ Small hiatal hernia. Gastric ulcer -S/P biopsy   Family History  Problem Relation Age of Onset  . Colon cancer Neg Hx    History  Substance Use Topics  . Smoking status: Never Smoker   . Smokeless tobacco: Not on file  . Alcohol Use: No    Review of Systems  Constitutional: Negative for fever and chills.  Respiratory: Negative for shortness of breath.   Cardiovascular: Negative for chest pain and palpitations.  Gastrointestinal: Negative for nausea.  Musculoskeletal: Negative for arthralgias.  Neurological: Negative for weakness and light-headedness.    Allergies  Codeine and Yellow jacket venom  Home Medications   Current Outpatient Rx  Name  Route  Sig  Dispense  Refill  . aspirin EC 81 MG tablet   Oral   Take 81 mg by mouth  every morning.         Marland Kitchen atorvastatin (LIPITOR) 80 MG tablet   Oral   Take 80 mg by mouth every evening.         . B Complex-C-Folic Acid (NEPHRO-VITE PO)   Oral   Take 1 tablet by mouth daily.         . cephALEXin (KEFLEX) 250 MG capsule   Oral   Take 250 mg by mouth 2 (two) times daily. For 5 days. Finished on 12-25         . fluticasone (FLONASE) 50 MCG/ACT nasal spray   Nasal   Place 2 sprays into the nose daily.         . furosemide (LASIX) 40 MG tablet   Oral   Take 20 mg by mouth daily.          . insulin NPH (HUMULIN N,NOVOLIN N) 100 UNIT/ML injection   Subcutaneous   Inject into the skin 2 (two) times daily after a meal. Use according to sliding scale.         . sevelamer carbonate (RENVELA) 800 MG tablet   Oral   Take 1,600 mg by mouth 2 (two) times daily.          . sucralfate (CARAFATE) 1 GM/10ML suspension   Oral   Take 10 mLs (1 g total) by mouth 3 (three) times daily  with meals as needed (heartburn or indigestion).   420 mL   0   . traMADol (ULTRAM) 50 MG tablet   Oral   Take 50 mg by mouth 2 (two) times daily as needed for moderate pain.         . nitroGLYCERIN (NITROSTAT) 0.4 MG SL tablet   Sublingual   Place 0.4 mg under the tongue every 5 (five) minutes as needed.          BP 182/75  Pulse 84  Temp(Src) 98.3 F (36.8 C) (Oral)  Resp 20  SpO2 99% Physical Exam  Nursing note and vitals reviewed. Constitutional: He is oriented to person, place, and time. He appears well-developed and well-nourished. No distress.  HENT:  Head: Normocephalic and atraumatic.  Eyes: EOM are normal. No scleral icterus.  Neck: Normal range of motion. Neck supple.  Cardiovascular: Normal rate, regular rhythm and intact distal pulses.   No murmur heard. Pulmonary/Chest: Effort normal and breath sounds normal. He has no wheezes.  Abdominal: Soft. There is no tenderness. There is no rebound.  Musculoskeletal:  Left forearm, no bleeding, easily  palpable thrill, 2+ RP, no skin discoloration, normal cap refill  Neurological: He is alert and oriented to person, place, and time.  Skin: Skin is warm. He is not diaphoretic.    ED Course  Procedures (including critical care time) Labs Review Labs Reviewed  CBC - Abnormal; Notable for the following:    RBC 4.02 (*)    Hemoglobin 12.1 (*)    HCT 36.1 (*)    All other components within normal limits  BASIC METABOLIC PANEL - Abnormal; Notable for the following:    Chloride 94 (*)    BUN 73 (*)    Creatinine, Ser 8.02 (*)    GFR calc non Af Amer 6 (*)    GFR calc Af Amer 7 (*)    All other components within normal limits   Imaging Review Dg Chest 2 View  02/12/2013   CLINICAL DATA:  Hypertension and diabetes and acute renal failure  EXAM: CHEST  2 VIEW  COMPARISON:  Portable chest x-ray of October 21, 2012.  FINDINGS: The film was taken with a somewhat lordotic technique. The lungs are clear. The hemidiaphragm on the left is chronically elevated. The cardiopericardial silhouette is top-normal in size. The pulmonary vascularity is not engorged. There is tortuosity of the ascending and descending portions of the thoracic aorta. There is no pleural effusion. The observed portions of the bony thorax exhibit no acute abnormalities.  IMPRESSION: There is no definite evidence of pneumonia nor CHF. I cannot exclude atelectasis in the left lower lobe posteriorly.   Electronically Signed   By: David  Martinique   On: 02/12/2013 18:48    EKG Interpretation   None      RA sat is 99% and I interpret to be normal   MDM   1. Complication of vascular access for dialysis, initial encounter    I discussed with Dr. Marval Regal who recommends that pt go to dialysis center in the AM, try again to access site, if unable, Dr. Lowanda Foster needs to place temporary dialysis catheter, perhaps with IR for dialysis.        Saddie Benders. Dorna Mai, MD 02/12/13 2355

## 2013-02-25 ENCOUNTER — Ambulatory Visit: Payer: Self-pay | Admitting: Vascular Surgery

## 2013-03-01 ENCOUNTER — Encounter (HOSPITAL_BASED_OUTPATIENT_CLINIC_OR_DEPARTMENT_OTHER): Payer: Medicare Other | Attending: General Surgery

## 2013-03-01 DIAGNOSIS — E119 Type 2 diabetes mellitus without complications: Secondary | ICD-10-CM | POA: Insufficient documentation

## 2013-03-01 DIAGNOSIS — S88119A Complete traumatic amputation at level between knee and ankle, unspecified lower leg, initial encounter: Secondary | ICD-10-CM | POA: Insufficient documentation

## 2013-03-01 DIAGNOSIS — I739 Peripheral vascular disease, unspecified: Secondary | ICD-10-CM | POA: Insufficient documentation

## 2013-03-01 DIAGNOSIS — I2510 Atherosclerotic heart disease of native coronary artery without angina pectoris: Secondary | ICD-10-CM | POA: Insufficient documentation

## 2013-03-01 DIAGNOSIS — Z7982 Long term (current) use of aspirin: Secondary | ICD-10-CM | POA: Insufficient documentation

## 2013-03-01 DIAGNOSIS — N189 Chronic kidney disease, unspecified: Secondary | ICD-10-CM | POA: Insufficient documentation

## 2013-03-01 DIAGNOSIS — L97509 Non-pressure chronic ulcer of other part of unspecified foot with unspecified severity: Secondary | ICD-10-CM | POA: Insufficient documentation

## 2013-03-01 DIAGNOSIS — Z794 Long term (current) use of insulin: Secondary | ICD-10-CM | POA: Insufficient documentation

## 2013-03-01 DIAGNOSIS — I129 Hypertensive chronic kidney disease with stage 1 through stage 4 chronic kidney disease, or unspecified chronic kidney disease: Secondary | ICD-10-CM | POA: Insufficient documentation

## 2013-03-01 DIAGNOSIS — Z9089 Acquired absence of other organs: Secondary | ICD-10-CM | POA: Insufficient documentation

## 2013-03-01 DIAGNOSIS — Z992 Dependence on renal dialysis: Secondary | ICD-10-CM | POA: Insufficient documentation

## 2013-03-02 NOTE — Progress Notes (Signed)
[facility]  NAME:  Alan [name], Alan [name] NO.:  [id number]  MEDICAL RECORD NO.:  [id number]      DATE OF BIRTH:  06-09-1940  PHYSICIAN:  Judene Companion, M.D.           VISIT DATE:                                  OFFICE VISIT   Mr. Upadhyay is a 73 year old African American male who is a diabetic.  He has already had his left leg removed with a BK amputation.  He has a history also of hypertension.  He is insulin dependent.  He also is on dialysis 3 times a week.  His medicines include Lasix, Humulin, insulin, nitroglycerin, tramadol, aspirin, and vitamins.  He is also on Carafate and Lipitor.  When he came to [place] today, his blood pressure was 130/66, respirations 17, pulse 75, temperature 98.  He has hypertension and coronary artery disease along with his chronic renal failure and peripheral vascular disease.  He has also had an appendectomy and a cholecystectomy.  I debrided the necrotic skin off the tip of his toe. The ulcers are about 2 x 3 cm in diameter.  I do not feel any pedal pulses, although the foot is reasonably warm. We are sending him for vascular studies which he is going to try to fit in between his dialysis treatments.  Today, we put some Santyl on his toe and he has some Silvadene ointment at home and he is going to use that and change the dressing every day.  We Bless see him back here in a week.     Judene Companion, M.D.     PP/MEDQ  D:  03/01/2013  T:  03/02/2013  Job:  [id number]

## 2013-03-09 ENCOUNTER — Other Ambulatory Visit (HOSPITAL_BASED_OUTPATIENT_CLINIC_OR_DEPARTMENT_OTHER): Payer: Self-pay | Admitting: General Surgery

## 2013-03-09 ENCOUNTER — Ambulatory Visit (HOSPITAL_COMMUNITY)
Admission: RE | Admit: 2013-03-09 | Discharge: 2013-03-09 | Disposition: A | Discharge status: Home / Self Care | Payer: Medicare Other | Source: Ambulatory Visit | Attending: Vascular Surgery | Admitting: Vascular Surgery

## 2013-03-09 ENCOUNTER — Other Ambulatory Visit (HOSPITAL_COMMUNITY): Payer: Self-pay | Admitting: General Surgery

## 2013-03-09 DIAGNOSIS — S88119A Complete traumatic amputation at level between knee and ankle, unspecified lower leg, initial encounter: Secondary | ICD-10-CM | POA: Insufficient documentation

## 2013-03-09 DIAGNOSIS — I739 Peripheral vascular disease, unspecified: Secondary | ICD-10-CM | POA: Insufficient documentation

## 2013-03-09 DIAGNOSIS — L97909 Non-pressure chronic ulcer of unspecified part of unspecified lower leg with unspecified severity: Secondary | ICD-10-CM

## 2013-03-09 DIAGNOSIS — L98499 Non-pressure chronic ulcer of skin of other sites with unspecified severity: Principal | ICD-10-CM | POA: Insufficient documentation

## 2013-03-09 DIAGNOSIS — E119 Type 2 diabetes mellitus without complications: Secondary | ICD-10-CM | POA: Insufficient documentation

## 2013-03-09 DIAGNOSIS — E785 Hyperlipidemia, unspecified: Secondary | ICD-10-CM | POA: Insufficient documentation

## 2013-03-09 DIAGNOSIS — L97509 Non-pressure chronic ulcer of other part of unspecified foot with unspecified severity: Secondary | ICD-10-CM

## 2013-03-09 DIAGNOSIS — Z87891 Personal history of nicotine dependence: Secondary | ICD-10-CM | POA: Insufficient documentation

## 2013-03-09 DIAGNOSIS — I10 Essential (primary) hypertension: Secondary | ICD-10-CM | POA: Insufficient documentation

## 2013-03-09 LAB — GLUCOSE, CAPILLARY: Glucose-Capillary: 125 mg/dL — ABNORMAL HIGH (ref 70–99)

## 2013-03-11 ENCOUNTER — Ambulatory Visit (HOSPITAL_COMMUNITY): Payer: Medicare Other

## 2013-03-21 DIAGNOSIS — I96 Gangrene, not elsewhere classified: Secondary | ICD-10-CM

## 2013-03-21 HISTORY — DX: Gangrene, not elsewhere classified: I96

## 2013-03-23 ENCOUNTER — Ambulatory Visit (HOSPITAL_COMMUNITY)
Admission: RE | Admit: 2013-03-23 | Discharge: 2013-03-23 | Disposition: A | Discharge status: Home / Self Care | Payer: Medicare Other | Source: Ambulatory Visit | Attending: General Surgery | Admitting: General Surgery

## 2013-03-23 ENCOUNTER — Other Ambulatory Visit (HOSPITAL_BASED_OUTPATIENT_CLINIC_OR_DEPARTMENT_OTHER): Payer: Self-pay | Admitting: General Surgery

## 2013-03-23 ENCOUNTER — Encounter (HOSPITAL_BASED_OUTPATIENT_CLINIC_OR_DEPARTMENT_OTHER): Payer: Medicare Other | Attending: General Surgery

## 2013-03-23 DIAGNOSIS — L97509 Non-pressure chronic ulcer of other part of unspecified foot with unspecified severity: Secondary | ICD-10-CM

## 2013-03-23 DIAGNOSIS — M25473 Effusion, unspecified ankle: Secondary | ICD-10-CM | POA: Insufficient documentation

## 2013-03-23 DIAGNOSIS — R937 Abnormal findings on diagnostic imaging of other parts of musculoskeletal system: Secondary | ICD-10-CM | POA: Insufficient documentation

## 2013-03-23 DIAGNOSIS — E1169 Type 2 diabetes mellitus with other specified complication: Secondary | ICD-10-CM | POA: Insufficient documentation

## 2013-03-23 DIAGNOSIS — M869 Osteomyelitis, unspecified: Secondary | ICD-10-CM | POA: Insufficient documentation

## 2013-03-23 DIAGNOSIS — L97409 Non-pressure chronic ulcer of unspecified heel and midfoot with unspecified severity: Secondary | ICD-10-CM | POA: Insufficient documentation

## 2013-03-23 DIAGNOSIS — M25476 Effusion, unspecified foot: Secondary | ICD-10-CM | POA: Insufficient documentation

## 2013-04-02 ENCOUNTER — Encounter: Payer: Self-pay | Admitting: Surgery

## 2013-04-02 ENCOUNTER — Other Ambulatory Visit: Payer: Self-pay

## 2013-04-02 DIAGNOSIS — I70269 Atherosclerosis of native arteries of extremities with gangrene, unspecified extremity: Secondary | ICD-10-CM

## 2013-04-05 ENCOUNTER — Other Ambulatory Visit (HOSPITAL_COMMUNITY): Payer: Medicare Other

## 2013-04-05 ENCOUNTER — Encounter: Payer: Medicare Other | Admitting: Surgery

## 2013-04-06 ENCOUNTER — Encounter (INDEPENDENT_AMBULATORY_CARE_PROVIDER_SITE_OTHER): Payer: Self-pay

## 2013-04-06 ENCOUNTER — Ambulatory Visit (HOSPITAL_COMMUNITY)
Admission: RE | Admit: 2013-04-06 | Discharge: 2013-04-06 | Disposition: A | Discharge status: Home / Self Care | Payer: Medicare Other | Source: Ambulatory Visit | Attending: Surgery | Admitting: Surgery

## 2013-04-06 DIAGNOSIS — I70269 Atherosclerosis of native arteries of extremities with gangrene, unspecified extremity: Secondary | ICD-10-CM

## 2013-04-06 DIAGNOSIS — S88119A Complete traumatic amputation at level between knee and ankle, unspecified lower leg, initial encounter: Secondary | ICD-10-CM | POA: Insufficient documentation

## 2013-04-08 ENCOUNTER — Encounter (HOSPITAL_COMMUNITY): Payer: Self-pay | Admitting: Emergency Medicine

## 2013-04-08 ENCOUNTER — Inpatient Hospital Stay (HOSPITAL_COMMUNITY)
Admission: EM | Admit: 2013-04-08 | Discharge: 2013-04-14 | DRG: 853 | Disposition: A | Discharge status: Home Health | Payer: Medicare Other | Attending: Internal Medicine | Admitting: Internal Medicine

## 2013-04-08 DIAGNOSIS — Z951 Presence of aortocoronary bypass graft: Secondary | ICD-10-CM

## 2013-04-08 DIAGNOSIS — I96 Gangrene, not elsewhere classified: Secondary | ICD-10-CM | POA: Insufficient documentation

## 2013-04-08 DIAGNOSIS — Z7982 Long term (current) use of aspirin: Secondary | ICD-10-CM

## 2013-04-08 DIAGNOSIS — Z8673 Personal history of transient ischemic attack (TIA), and cerebral infarction without residual deficits: Secondary | ICD-10-CM

## 2013-04-08 DIAGNOSIS — L03031 Cellulitis of right toe: Secondary | ICD-10-CM

## 2013-04-08 DIAGNOSIS — Z794 Long term (current) use of insulin: Secondary | ICD-10-CM

## 2013-04-08 DIAGNOSIS — N186 End stage renal disease: Secondary | ICD-10-CM | POA: Diagnosis present

## 2013-04-08 DIAGNOSIS — A419 Sepsis, unspecified organism: Principal | ICD-10-CM | POA: Diagnosis present

## 2013-04-08 DIAGNOSIS — M51379 Other intervertebral disc degeneration, lumbosacral region without mention of lumbar back pain or lower extremity pain: Secondary | ICD-10-CM | POA: Diagnosis present

## 2013-04-08 DIAGNOSIS — I70269 Atherosclerosis of native arteries of extremities with gangrene, unspecified extremity: Secondary | ICD-10-CM | POA: Diagnosis present

## 2013-04-08 DIAGNOSIS — E1169 Type 2 diabetes mellitus with other specified complication: Secondary | ICD-10-CM | POA: Diagnosis present

## 2013-04-08 DIAGNOSIS — Z79899 Other long term (current) drug therapy: Secondary | ICD-10-CM

## 2013-04-08 DIAGNOSIS — E1159 Type 2 diabetes mellitus with other circulatory complications: Secondary | ICD-10-CM | POA: Diagnosis present

## 2013-04-08 DIAGNOSIS — Z885 Allergy status to narcotic agent status: Secondary | ICD-10-CM

## 2013-04-08 DIAGNOSIS — S88119A Complete traumatic amputation at level between knee and ankle, unspecified lower leg, initial encounter: Secondary | ICD-10-CM

## 2013-04-08 DIAGNOSIS — I2510 Atherosclerotic heart disease of native coronary artery without angina pectoris: Secondary | ICD-10-CM | POA: Diagnosis present

## 2013-04-08 DIAGNOSIS — E43 Unspecified severe protein-calorie malnutrition: Secondary | ICD-10-CM | POA: Diagnosis present

## 2013-04-08 DIAGNOSIS — M869 Osteomyelitis, unspecified: Secondary | ICD-10-CM | POA: Diagnosis present

## 2013-04-08 DIAGNOSIS — N2581 Secondary hyperparathyroidism of renal origin: Secondary | ICD-10-CM | POA: Diagnosis present

## 2013-04-08 DIAGNOSIS — R7881 Bacteremia: Secondary | ICD-10-CM

## 2013-04-08 DIAGNOSIS — D649 Anemia, unspecified: Secondary | ICD-10-CM | POA: Diagnosis present

## 2013-04-08 DIAGNOSIS — Z992 Dependence on renal dialysis: Secondary | ICD-10-CM

## 2013-04-08 DIAGNOSIS — N390 Urinary tract infection, site not specified: Secondary | ICD-10-CM | POA: Diagnosis present

## 2013-04-08 DIAGNOSIS — M5030 Other cervical disc degeneration, unspecified cervical region: Secondary | ICD-10-CM | POA: Diagnosis present

## 2013-04-08 DIAGNOSIS — J45909 Unspecified asthma, uncomplicated: Secondary | ICD-10-CM | POA: Diagnosis present

## 2013-04-08 DIAGNOSIS — L97909 Non-pressure chronic ulcer of unspecified part of unspecified lower leg with unspecified severity: Secondary | ICD-10-CM | POA: Diagnosis present

## 2013-04-08 DIAGNOSIS — M9080 Osteopathy in diseases classified elsewhere, unspecified site: Secondary | ICD-10-CM | POA: Diagnosis present

## 2013-04-08 DIAGNOSIS — E785 Hyperlipidemia, unspecified: Secondary | ICD-10-CM | POA: Diagnosis present

## 2013-04-08 DIAGNOSIS — Z91038 Other insect allergy status: Secondary | ICD-10-CM

## 2013-04-08 DIAGNOSIS — M5137 Other intervertebral disc degeneration, lumbosacral region: Secondary | ICD-10-CM | POA: Diagnosis present

## 2013-04-08 DIAGNOSIS — I120 Hypertensive chronic kidney disease with stage 5 chronic kidney disease or end stage renal disease: Secondary | ICD-10-CM | POA: Diagnosis present

## 2013-04-08 DIAGNOSIS — IMO0002 Reserved for concepts with insufficient information to code with codable children: Secondary | ICD-10-CM

## 2013-04-08 DIAGNOSIS — I10 Essential (primary) hypertension: Secondary | ICD-10-CM | POA: Diagnosis present

## 2013-04-08 DIAGNOSIS — Z89519 Acquired absence of unspecified leg below knee: Secondary | ICD-10-CM

## 2013-04-08 LAB — CBC WITH DIFFERENTIAL/PLATELET
BASOS ABS: 0.1 10*3/uL (ref 0.0–0.1)
Basophils Relative: 0 % (ref 0–1)
EOS ABS: 0.1 10*3/uL (ref 0.0–0.7)
EOS PCT: 1 % (ref 0–5)
HEMATOCRIT: 33.4 % — AB (ref 39.0–52.0)
Hemoglobin: 11.1 g/dL — ABNORMAL LOW (ref 13.0–17.0)
LYMPHS ABS: 2.3 10*3/uL (ref 0.7–4.0)
LYMPHS PCT: 16 % (ref 12–46)
MCH: 30.7 pg (ref 26.0–34.0)
MCHC: 33.2 g/dL (ref 30.0–36.0)
MCV: 92.3 fL (ref 78.0–100.0)
MONO ABS: 0.7 10*3/uL (ref 0.1–1.0)
Monocytes Relative: 5 % (ref 3–12)
Neutro Abs: 11.4 10*3/uL — ABNORMAL HIGH (ref 1.7–7.7)
Neutrophils Relative %: 79 % — ABNORMAL HIGH (ref 43–77)
Platelets: 336 10*3/uL (ref 150–400)
RBC: 3.62 MIL/uL — ABNORMAL LOW (ref 4.22–5.81)
RDW: 14 % (ref 11.5–15.5)
WBC: 14.5 10*3/uL — AB (ref 4.0–10.5)

## 2013-04-08 LAB — BASIC METABOLIC PANEL
BUN: 40 mg/dL — AB (ref 6–23)
CHLORIDE: 90 meq/L — AB (ref 96–112)
CO2: 29 mEq/L (ref 19–32)
Calcium: 9.1 mg/dL (ref 8.4–10.5)
Creatinine, Ser: 5.51 mg/dL — ABNORMAL HIGH (ref 0.50–1.35)
GFR calc Af Amer: 11 mL/min — ABNORMAL LOW (ref >90)
GFR calc non Af Amer: 9 mL/min — ABNORMAL LOW (ref >90)
Glucose, Bld: 159 mg/dL — ABNORMAL HIGH (ref 70–99)
Potassium: 4.3 mEq/L (ref 3.7–5.3)
Sodium: 137 mEq/L (ref 137–147)

## 2013-04-08 LAB — GLUCOSE, CAPILLARY: GLUCOSE-CAPILLARY: 194 mg/dL — AB (ref 70–99)

## 2013-04-08 MED ORDER — FLUTICASONE PROPIONATE 50 MCG/ACT NA SUSP: 2.0000 | Freq: Every day | NASAL | Status: DC | PRN

## 2013-04-08 MED ORDER — ACETAMINOPHEN 325 MG PO TABS
650.0000 mg | ORAL_TABLET | Freq: Four times a day (QID) | ORAL | Status: DC | PRN
  Administered 2013-04-09 – 2013-04-11 (×2): 650 mg via ORAL
  Filled 2013-04-08 (×2): qty 2

## 2013-04-08 MED ORDER — ACETAMINOPHEN 650 MG RE SUPP: 650.0000 mg | Freq: Four times a day (QID) | RECTAL | Status: DC | PRN

## 2013-04-08 MED ORDER — SEVELAMER CARBONATE 800 MG PO TABS
1600.0000 mg | ORAL_TABLET | Freq: Two times a day (BID) | ORAL | Status: DC
  Administered 2013-04-09 – 2013-04-14 (×9): 1600 mg via ORAL
  Filled 2013-04-08 (×13): qty 2

## 2013-04-08 MED ORDER — VANCOMYCIN HCL IN DEXTROSE 1-5 GM/200ML-% IV SOLN
1000.0000 mg | Freq: Once | INTRAVENOUS | Status: AC
  Administered 2013-04-08: 1000 mg via INTRAVENOUS
  Filled 2013-04-08: qty 200

## 2013-04-08 MED ORDER — VANCOMYCIN HCL IN DEXTROSE 750-5 MG/150ML-% IV SOLN
750.0000 mg | INTRAVENOUS | Status: DC
  Administered 2013-04-09: 750 mg via INTRAVENOUS
  Filled 2013-04-08 (×2): qty 150

## 2013-04-08 MED ORDER — ASPIRIN EC 81 MG PO TBEC
81.0000 mg | DELAYED_RELEASE_TABLET | Freq: Every evening | ORAL | Status: DC
  Administered 2013-04-08 – 2013-04-13 (×5): 81 mg via ORAL
  Filled 2013-04-08 (×7): qty 1

## 2013-04-08 MED ORDER — ONDANSETRON HCL 4 MG/2ML IJ SOLN: 4.0000 mg | Freq: Four times a day (QID) | INTRAMUSCULAR | Status: DC | PRN

## 2013-04-08 MED ORDER — INSULIN ASPART 100 UNIT/ML ~~LOC~~ SOLN: 0.0000 [IU] | Freq: Four times a day (QID) | SUBCUTANEOUS | Status: DC

## 2013-04-08 MED ORDER — MORPHINE SULFATE 4 MG/ML IJ SOLN
4.0000 mg | Freq: Once | INTRAMUSCULAR | Status: AC
  Administered 2013-04-08: 4 mg via INTRAVENOUS
  Filled 2013-04-08: qty 1

## 2013-04-08 MED ORDER — HEPARIN SODIUM (PORCINE) 5000 UNIT/ML IJ SOLN
5000.0000 [IU] | Freq: Three times a day (TID) | INTRAMUSCULAR | Status: DC
  Administered 2013-04-08 – 2013-04-14 (×15): 5000 [IU] via SUBCUTANEOUS
  Filled 2013-04-08 (×20): qty 1

## 2013-04-08 MED ORDER — SUCRALFATE 1 GM/10ML PO SUSP
1.0000 g | Freq: Three times a day (TID) | ORAL | Status: DC | PRN
  Filled 2013-04-08 (×3): qty 10

## 2013-04-08 MED ORDER — NITROGLYCERIN 0.4 MG SL SUBL: 0.4000 mg | SUBLINGUAL_TABLET | SUBLINGUAL | Status: DC | PRN

## 2013-04-08 MED ORDER — PIPERACILLIN-TAZOBACTAM 3.375 G IVPB 30 MIN
3.3750 g | Freq: Once | INTRAVENOUS | Status: AC
  Administered 2013-04-08: 3.375 g via INTRAVENOUS
  Filled 2013-04-08: qty 50

## 2013-04-08 MED ORDER — INSULIN ASPART 100 UNIT/ML ~~LOC~~ SOLN
0.0000 [IU] | Freq: Every day | SUBCUTANEOUS | Status: DC
  Administered 2013-04-10: 2 [IU] via SUBCUTANEOUS

## 2013-04-08 MED ORDER — ATORVASTATIN CALCIUM 80 MG PO TABS
80.0000 mg | ORAL_TABLET | Freq: Every evening | ORAL | Status: DC
  Administered 2013-04-08 – 2013-04-13 (×5): 80 mg via ORAL
  Filled 2013-04-08 (×7): qty 1

## 2013-04-08 MED ORDER — ONDANSETRON HCL 4 MG PO TABS: 4.0000 mg | ORAL_TABLET | Freq: Four times a day (QID) | ORAL | Status: DC | PRN

## 2013-04-08 MED ORDER — PIPERACILLIN-TAZOBACTAM IN DEX 2-0.25 GM/50ML IV SOLN
2.2500 g | Freq: Three times a day (TID) | INTRAVENOUS | Status: DC
  Administered 2013-04-09 – 2013-04-11 (×7): 2.25 g via INTRAVENOUS
  Filled 2013-04-08 (×9): qty 50

## 2013-04-08 NOTE — ED Notes (Addendum)
Pt reports sore to right big toe, was sent yesterday for vascular study and then called and told to come here for vascular consult due to poor blood flow and now running a fever, temp 100.2 at triage. Dialysis pt, last treatment was yesterday and BKA left side due to peripheral vascular disease.

## 2013-04-08 NOTE — Progress Notes (Signed)
ANTIBIOTIC CONSULT NOTE - INITIAL  Pharmacy Consult for vancomycin + zosyn Indication: wound infection  Allergies  Allergen Reactions  . Codeine Itching and Nausea And Vomiting  . Yellow Jacket Venom [Bee Venom] Swelling    Patient Measurements:   Adjusted Body Weight:   Vital Signs: Temp: 100.2 F (37.9 C) (02/19 1853) Temp src: Oral (02/19 1853) BP: 133/56 mmHg (02/19 2045) Pulse Rate: 96 (02/19 2045) Intake/Output from previous day:   Intake/Output from this shift:    Labs:  Recent Labs  04/08/13 1909  WBC 14.5*  HGB 11.1*  PLT 336  CREATININE 5.51*   The CrCl is unknown because both a height and weight (above a minimum accepted value) are required for this calculation. No results found for this basename: VANCOTROUGH, VANCOPEAK, VANCORANDOM, GENTTROUGH, GENTPEAK, GENTRANDOM, TOBRATROUGH, TOBRAPEAK, TOBRARND, AMIKACINPEAK, AMIKACINTROU, AMIKACIN,  in the last 72 hours   Microbiology: No results found for this or any previous visit (from the past 720 hour(s)).  Medical History: Past Medical History  Diagnosis Date  . Diabetes mellitus   . Hypertension   . Gout   . Coronary artery disease   . Stroke   . Asthma   . Renal insufficiency   . Hypercholesteremia   . Arthritis   . DDD (degenerative disc disease), lumbar   . DDD (degenerative disc disease), cervical   . Gangrene of toe Feb. 2015    Right  great      Medications:  Anti-infectives   Start     Dose/Rate Route Frequency Ordered Stop   04/09/13 1200  vancomycin (VANCOCIN) IVPB 750 mg/150 ml premix     750 mg 150 mL/hr over 60 Minutes Intravenous Every M-W-F (Hemodialysis) 04/08/13 2117     04/09/13 0600  piperacillin-tazobactam (ZOSYN) IVPB 2.25 g     2.25 g 100 mL/hr over 30 Minutes Intravenous 3 times per day 04/08/13 2117     04/08/13 2115  piperacillin-tazobactam (ZOSYN) IVPB 3.375 g     3.375 g 100 mL/hr over 30 Minutes Intravenous  Once 04/08/13 2108     04/08/13 1945  vancomycin  (VANCOCIN) IVPB 1000 mg/200 mL premix     1,000 mg 200 mL/hr over 60 Minutes Intravenous  Once 04/08/13 1945       Assessment: 68 yom presented to the ED with a wound infection. To start empiric vancomycin and zosyn. Tmax is 100.2 and WBC is 14.5. Pt is on HD MWF with last HD yesterday. He has already received 1gm vanc in the ED so far.  Vanc 2/19>> Zosyn 2/19>>  Goal of Therapy:  Pre-HD vanc level 15-25  Plan:  1. Vancomycin 750mg  post-HD on MWF 2. Zosyn 3.375gm IV x 1 then 2.25gm IV Q8H 3. F/u renal fxn, C&S, clinical status and pre-HD vanc level when appropriate  Rikayla Demmon, Rande Lawman 04/08/2013,9:17 PM

## 2013-04-08 NOTE — H&P (Signed)
[facility] History and Physical  Patient: Alan [name]  [id number]  DOB: [birth year]  DOS: the patient was seen and examined on 04/08/2013 PCP: No PCP Per Patient  Chief Complaint: Right leg pain and fever  HPI: Alan [name] is a 73 y.o. 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 The patient is coming from home. The patient presents with complaints of fever with chills that has been ongoing since last to 3 days along with that he has some soreness on one of the right leg that has been ongoing since last 2 weeks and progressively worsening. He went to see wound care clinic who recommended him to be evaluated by vascular ABI, since his ABI were significantly low 0.67  he was recommended to be evaluated by orthopedics who referred him to ED due to presence of fever and progressive infection. Pt denies any fever, chills, headache, cough, chest pain, palpitation, shortness of breath, orthopnea, PND, nausea, vomiting, abdominal pain, diarrhea, constipation, active bleeding, burning urination, dizziness,  Review of Systems: as mentioned in the history of present illness.  A Comprehensive review of the other systems is negative.  Past Medical History  Diagnosis Date  . Diabetes mellitus   . Hypertension   . Gout   . Coronary artery disease   . Stroke   . Asthma   . Renal insufficiency   . Hypercholesteremia   . Arthritis   . DDD (degenerative disc disease), lumbar   . DDD (degenerative disc disease), cervical   . Gangrene of toe Feb. 2015    Right  great     Past Surgical History  Procedure Laterality Date  . Coronary artery bypass graft    . Below knee leg amputation Left   . Esophagogastroduodenoscopy Left 05/24/2012    [id number] dilated baggy but otherwise a normal/ Small hiatal hernia. Gastric ulcer -S/P biopsy   Social History:  reports  that he has never smoked. He has never used smokeless tobacco. He reports that he does not drink alcohol or use illicit drugs. Independent for most of his  ADL.  Allergies  Allergen Reactions  . Codeine Itching and Nausea And Vomiting  . Yellow Jacket Venom [Bee Venom] Swelling    Family History  Problem Relation Age of Onset  . Colon cancer Neg Hx     Prior to Admission medications   Medication Sig Start Date End Date Taking? Authorizing Provider  Acetaminophen (TYLENOL PO) Take 1,200 mg by mouth 2 (two) times daily.   Yes Historical Provider, MD  aspirin EC 81 MG tablet Take 81 mg by mouth every evening.    Yes Historical Provider, MD  atorvastatin (LIPITOR) 80 MG tablet Take 80 mg by mouth every evening.   Yes Historical Provider, MD  B Complex-C-Folic Acid (NEPHRO-VITE PO) Take 1 tablet by mouth every evening.    Yes Historical Provider, MD  fluticasone (FLONASE) 50 MCG/ACT nasal spray Place 2 sprays into both nostrils daily as needed for allergies.    Yes Historical Provider, MD  furosemide (LASIX) 40 MG tablet Take 20 mg by mouth every evening.   Yes Historical Provider, MD  insulin NPH (HUMULIN N,NOVOLIN N) 100 UNIT/ML injection Inject 10 Units into the skin at bedtime. Use according to sliding scale.   Yes Historical Provider, MD  nitroGLYCERIN (NITROSTAT) 0.4 MG SL tablet Place 0.4 mg under the tongue every 5 (five) minutes as needed for chest  pain.    Yes Historical Provider, MD  sevelamer carbonate (RENVELA) 800 MG tablet Take 1,600 mg by mouth 2 (two) times daily.    Yes Historical Provider, MD  sucralfate (CARAFATE) 1 GM/10ML suspension Take 10 mLs (1 g total) by mouth 3 (three) times daily with meals as needed (heartburn or indigestion). 05/25/12  Yes Doree Albee, MD    Physical Exam: Filed Vitals:   04/08/13 1853 04/08/13 2045 04/08/13 2130  BP: 136/78 133/56 123/51  Pulse: 93 96 90  Temp: 100.2 F (37.9 C)    TempSrc: Oral    Resp: 20    SpO2: 98% 99% 99%     General: Alert, Awake and Oriented to Time, Place and Person. Appear in mild distress Eyes: PERRL ENT: Oral Mucosa clear moist. Neck: no JVD Cardiovascular: S1 and S2 Present, no Murmur, Peripheral Pulses Present Respiratory: Bilateral Air entry equal and Decreased, Clear to Auscultation,  no Crackles,no wheezes Abdomen: Bowel Sound Present, Soft and Non tender Skin: no Rash Extremities: no Pedal edema, no calf tenderness,  Left leg AKA, right leg presence of black necrotic tissue on toe and warmth and redness on right leg Neurologic: Grossly Unremarkable. Labs on Admission:  CBC:  Recent Labs Lab 04/08/13 1909  WBC 14.5*  NEUTROABS 11.4*  HGB 11.1*  HCT 33.4*  MCV 92.3  PLT 336    CMP     Component Value Date/Time   NA 137 04/08/2013 1909   K 4.3 04/08/2013 1909   CL 90* 04/08/2013 1909   CO2 29 04/08/2013 1909   GLUCOSE 159* 04/08/2013 1909   BUN 40* 04/08/2013 1909   CREATININE 5.51* 04/08/2013 1909   CALCIUM 9.1 04/08/2013 1909   CALCIUM 7.7* 05/20/2012 1530   PROT 5.7* 05/25/2012 0528   ALBUMIN 2.2* 05/25/2012 0528   AST 21 05/25/2012 0528   ALT 9 05/25/2012 0528   ALKPHOS 56 05/25/2012 0528   BILITOT 0.2* 05/25/2012 0528   GFRNONAA 9* 04/08/2013 1909   GFRAA 11* 04/08/2013 1909    No results found for this basename: LIPASE, AMYLASE,  in the last 168 hours No results found for this basename: AMMONIA,  in the last 168 hours  No results found for this basename: CKTOTAL, CKMB, CKMBINDEX, TROPONINI,  in the last 168 hours BNP (last 3 results)  Recent Labs  10/21/12 1117  PROBNP 875.7*    probable  Assessment/Plan Principal Problem:   Diabetic osteomyelitis Active Problems:   CAD (coronary artery disease)   DM type 2 causing vascular disease   HTN (hypertension), benign   S/P BKA (below knee amputation) unilateral, left   ESRD on dialysis   1. Diabetic osteomyelitis Patient presents with fever leukocytosis. Recent MRI  suggestive of osteomyelitis. Patient is  unclear whether he is on antibiotics or not. He has been evaluated by orthopedics and has recommended admission. At present the patient Jaelen be started on IV vancomycin and IV Zosyn. Blood cultures all pain. Orthopedic Tamotsu be consulted for further recommendation. He may require amputation  2. ESRD Nephrology Nysir be consulted for continuation of hemodialysis  3. diabetes mellitus Continue sliding scale  4.Coronary artery disease, peripheral vascular disease   continue aspirin, he may require vascular surgery consultation as well due to severe low ABI.  DVT Prophylaxis: subcutaneous Heparin Nutrition: Renal diet   Code Status: Full   Family Communication: Family  was present at bedside, opportunity was given to ask question and all questions were answered satisfactorily at the time of interview.  Disposition: Admitted to inpatient in med-surge unit.  Author: Berle Mull, MD [facility] Pager: [phone] 04/08/2013, 10:04 PM    If 7PM-7AM, please contact night-coverage [email or web address] Password [email or web address]

## 2013-04-08 NOTE — ED Provider Notes (Signed)
CSN: [id number]     Arrival date & time 04/08/13  1825 History   First MD Initiated Contact with Patient 04/08/13 1918     Chief Complaint  Patient presents with  . Wound Infection  Patient gave verbal permission to utilize photo for medical documentation only The image was not stored on any personal device     Patient is a 73 y.o. male presenting with toe pain. The history is provided by the patient and a significant other.  Toe Pain This is a new problem. The current episode started more than 2 days ago. The problem occurs daily. The problem has been gradually worsening. Pertinent negatives include no shortness of breath. Exacerbated by: palpation. The symptoms are relieved by rest. He has tried rest for the symptoms. The treatment provided no relief.  pt presents for worsening pain/drainage/redness around right great toe He has h/o gangrene in the toe, but now pain is increased with redness/drainage No recent trauma He has had fever today  He is a dialysis patient and last treatment was yesterday  Past Medical History  Diagnosis Date  . Diabetes mellitus   . Hypertension   . Gout   . Coronary artery disease   . Stroke   . Asthma   . Renal insufficiency   . Hypercholesteremia   . Arthritis   . DDD (degenerative disc disease), lumbar   . DDD (degenerative disc disease), cervical   . Gangrene of toe Feb. 2015    Right  great     Past Surgical History  Procedure Laterality Date  . Coronary artery bypass graft    . Below knee leg amputation Left   . Esophagogastroduodenoscopy Left 05/24/2012    [id number] dilated baggy but otherwise a normal/ Small hiatal hernia. Gastric ulcer -S/P biopsy   Family History  Problem Relation Age of Onset  . Colon cancer Neg Hx    History  Substance Use Topics  . Smoking status: Never Smoker   . Smokeless tobacco: Never Used  . Alcohol Use: No    Review of Systems  Constitutional: Positive for fever.  Respiratory: Negative for  shortness of breath.   Gastrointestinal: Negative for vomiting.  Skin: Positive for wound.  All other systems reviewed and are negative.      Allergies  Codeine and Yellow jacket venom  Home Medications   Current Outpatient Rx  Name  Route  Sig  Dispense  Refill  . Acetaminophen (TYLENOL PO)   Oral   Take 1,200 mg by mouth 2 (two) times daily.         Marland Kitchen aspirin EC 81 MG tablet   Oral   Take 81 mg by mouth every evening.          Marland Kitchen atorvastatin (LIPITOR) 80 MG tablet   Oral   Take 80 mg by mouth every evening.         . B Complex-C-Folic Acid (NEPHRO-VITE PO)   Oral   Take 1 tablet by mouth every evening.          . fluticasone (FLONASE) 50 MCG/ACT nasal spray   Each Nare   Place 2 sprays into both nostrils daily as needed for allergies.          . furosemide (LASIX) 40 MG tablet   Oral   Take 20 mg by mouth every evening.         . insulin NPH (HUMULIN N,NOVOLIN N) 100 UNIT/ML injection   Subcutaneous   Inject 10 Units  into the skin at bedtime. Use according to sliding scale.         . nitroGLYCERIN (NITROSTAT) 0.4 MG SL tablet   Sublingual   Place 0.4 mg under the tongue every 5 (five) minutes as needed for chest pain.          . sevelamer carbonate (RENVELA) 800 MG tablet   Oral   Take 1,600 mg by mouth 2 (two) times daily.          . sucralfate (CARAFATE) 1 GM/10ML suspension   Oral   Take 10 mLs (1 g total) by mouth 3 (three) times daily with meals as needed (heartburn or indigestion).   420 mL   0    BP 136/78  Pulse 93  Temp(Src) 100.2 F (37.9 C) (Oral)  Resp 20  SpO2 98% Physical Exam CONSTITUTIONAL: Well developed/well nourished HEAD: Normocephalic/atraumatic EYES: EOMI/PERRL ENMT: Mucous membranes moist NECK: supple no meningeal signs CV: S1/S2 noted, no murmurs/rubs/gallops noted LUNGS: Lungs are clear to auscultation bilaterally, no apparent distress ABDOMEN: soft, nontender, no rebound or guarding NEURO: Pt is  awake/alert, moves all extremitiesx4 EXTREMITIES: dialysis access to left UE, thrill noted Distal pulse intact on right foot by doppler and the foot is warm to touch See photo below SKIN: warm, color normal PSYCH: no abnormalities of mood noted      ED Course  Procedures Labs Review Labs Reviewed  CBC WITH DIFFERENTIAL - Abnormal; Notable for the following:    WBC 14.5 (*)    RBC 3.62 (*)    Hemoglobin 11.1 (*)    HCT 33.4 (*)    Neutrophils Relative % 79 (*)    Neutro Abs 11.4 (*)    All other components within normal limits  BASIC METABOLIC PANEL   Pt here from orthopedics office for evaluation of wound on toe.  It appears infected and he has had fever up to 101 today.  He Carrel need admission for IV antibiotics.    He Amandeep likely need vascular consult in hospital D/w [facility] patel, Paco admit   MDM   Final diagnoses:  Cellulitis of toe, right  Gangrene of toe    Nursing notes including past medical history and social history reviewed and considered in documentation Labs/vital reviewed and considered     Sharyon Cable, MD 04/08/13 2054

## 2013-04-09 DIAGNOSIS — M869 Osteomyelitis, unspecified: Secondary | ICD-10-CM

## 2013-04-09 DIAGNOSIS — E1159 Type 2 diabetes mellitus with other circulatory complications: Secondary | ICD-10-CM

## 2013-04-09 DIAGNOSIS — L03039 Cellulitis of unspecified toe: Secondary | ICD-10-CM

## 2013-04-09 DIAGNOSIS — L02619 Cutaneous abscess of unspecified foot: Secondary | ICD-10-CM

## 2013-04-09 DIAGNOSIS — Z992 Dependence on renal dialysis: Secondary | ICD-10-CM

## 2013-04-09 DIAGNOSIS — E1169 Type 2 diabetes mellitus with other specified complication: Secondary | ICD-10-CM | POA: Diagnosis present

## 2013-04-09 DIAGNOSIS — N186 End stage renal disease: Secondary | ICD-10-CM | POA: Diagnosis present

## 2013-04-09 DIAGNOSIS — E43 Unspecified severe protein-calorie malnutrition: Secondary | ICD-10-CM | POA: Insufficient documentation

## 2013-04-09 DIAGNOSIS — I798 Other disorders of arteries, arterioles and capillaries in diseases classified elsewhere: Secondary | ICD-10-CM

## 2013-04-09 LAB — RENAL FUNCTION PANEL
Albumin: 2 g/dL — ABNORMAL LOW (ref 3.5–5.2)
BUN: 53 mg/dL — ABNORMAL HIGH (ref 6–23)
CHLORIDE: 88 meq/L — AB (ref 96–112)
CO2: 27 meq/L (ref 19–32)
Calcium: 8.5 mg/dL (ref 8.4–10.5)
Creatinine, Ser: 7.13 mg/dL — ABNORMAL HIGH (ref 0.50–1.35)
GFR calc Af Amer: 8 mL/min — ABNORMAL LOW (ref >90)
GFR, EST NON AFRICAN AMERICAN: 7 mL/min — AB (ref >90)
Glucose, Bld: 220 mg/dL — ABNORMAL HIGH (ref 70–99)
Phosphorus: 3.4 mg/dL (ref 2.3–4.6)
Potassium: 4.5 mEq/L (ref 3.7–5.3)
Sodium: 132 mEq/L — ABNORMAL LOW (ref 137–147)

## 2013-04-09 LAB — CBC
HEMATOCRIT: 27.9 % — AB (ref 39.0–52.0)
HEMOGLOBIN: 8.8 g/dL — AB (ref 13.0–17.0)
MCH: 29.2 pg (ref 26.0–34.0)
MCHC: 31.5 g/dL (ref 30.0–36.0)
MCV: 92.7 fL (ref 78.0–100.0)
Platelets: 287 10*3/uL (ref 150–400)
RBC: 3.01 MIL/uL — AB (ref 4.22–5.81)
RDW: 14.3 % (ref 11.5–15.5)
WBC: 10.9 10*3/uL — ABNORMAL HIGH (ref 4.0–10.5)

## 2013-04-09 LAB — URINALYSIS, ROUTINE W REFLEX MICROSCOPIC
Bilirubin Urine: NEGATIVE
GLUCOSE, UA: NEGATIVE mg/dL
KETONES UR: NEGATIVE mg/dL
Leukocytes, UA: NEGATIVE
NITRITE: NEGATIVE
Protein, ur: 100 mg/dL — AB
Specific Gravity, Urine: 1.019 (ref 1.005–1.030)
Urobilinogen, UA: 0.2 mg/dL (ref 0.0–1.0)
pH: 6.5 (ref 5.0–8.0)

## 2013-04-09 LAB — COMPREHENSIVE METABOLIC PANEL
ALT: 6 U/L (ref 0–53)
AST: 10 U/L (ref 0–37)
Albumin: 2 g/dL — ABNORMAL LOW (ref 3.5–5.2)
Alkaline Phosphatase: 76 U/L (ref 39–117)
BUN: 43 mg/dL — ABNORMAL HIGH (ref 6–23)
CALCIUM: 8.6 mg/dL (ref 8.4–10.5)
CO2: 28 mEq/L (ref 19–32)
CREATININE: 6.35 mg/dL — AB (ref 0.50–1.35)
Chloride: 89 mEq/L — ABNORMAL LOW (ref 96–112)
GFR calc Af Amer: 9 mL/min — ABNORMAL LOW (ref >90)
GFR calc non Af Amer: 8 mL/min — ABNORMAL LOW (ref >90)
GLUCOSE: 199 mg/dL — AB (ref 70–99)
Potassium: 4.2 mEq/L (ref 3.7–5.3)
SODIUM: 132 meq/L — AB (ref 137–147)
TOTAL PROTEIN: 6.5 g/dL (ref 6.0–8.3)
Total Bilirubin: 0.3 mg/dL (ref 0.3–1.2)

## 2013-04-09 LAB — PROTIME-INR
INR: 1.15 (ref 0.00–1.49)
Prothrombin Time: 14.5 seconds (ref 11.6–15.2)

## 2013-04-09 LAB — URINE MICROSCOPIC-ADD ON

## 2013-04-09 LAB — GLUCOSE, CAPILLARY
GLUCOSE-CAPILLARY: 167 mg/dL — AB (ref 70–99)
GLUCOSE-CAPILLARY: 153 mg/dL — AB (ref 70–99)
Glucose-Capillary: 178 mg/dL — ABNORMAL HIGH (ref 70–99)

## 2013-04-09 LAB — IRON AND TIBC
IRON: 11 ug/dL — AB (ref 42–135)
SATURATION RATIOS: 7 % — AB (ref 20–55)
TIBC: 153 ug/dL — AB (ref 215–435)
UIBC: 142 ug/dL (ref 125–400)

## 2013-04-09 LAB — HEPATITIS B SURFACE ANTIGEN: HEP B S AG: NEGATIVE

## 2013-04-09 MED ORDER — PENTAFLUOROPROP-TETRAFLUOROETH EX AERO: 1.0000 "application " | INHALATION_SPRAY | CUTANEOUS | Status: DC | PRN

## 2013-04-09 MED ORDER — NEPRO/CARBSTEADY PO LIQD
237.0000 mL | ORAL | Status: DC
  Administered 2013-04-09 – 2013-04-10 (×2): 237 mL via ORAL

## 2013-04-09 MED ORDER — RENA-VITE PO TABS
1.0000 | ORAL_TABLET | Freq: Every day | ORAL | Status: DC
  Administered 2013-04-09 – 2013-04-13 (×5): 1 via ORAL
  Filled 2013-04-09 (×6): qty 1

## 2013-04-09 MED ORDER — ALTEPLASE 2 MG IJ SOLR
2.0000 mg | Freq: Once | INTRAMUSCULAR | Status: AC | PRN
  Filled 2013-04-09: qty 2

## 2013-04-09 MED ORDER — LIDOCAINE-PRILOCAINE 2.5-2.5 % EX CREA: 1.0000 "application " | TOPICAL_CREAM | CUTANEOUS | Status: DC | PRN

## 2013-04-09 MED ORDER — HEPARIN SODIUM (PORCINE) 1000 UNIT/ML DIALYSIS
1000.0000 [IU] | INTRAMUSCULAR | Status: DC | PRN
  Filled 2013-04-09: qty 1

## 2013-04-09 MED ORDER — CHLORHEXIDINE GLUCONATE 4 % EX LIQD
60.0000 mL | Freq: Once | CUTANEOUS | Status: AC
  Administered 2013-04-10: 4 via TOPICAL
  Filled 2013-04-09: qty 60

## 2013-04-09 MED ORDER — SODIUM CHLORIDE 0.9 % IV SOLN: 100.0000 mL | INTRAVENOUS | Status: DC | PRN

## 2013-04-09 MED ORDER — NEPRO/CARBSTEADY PO LIQD: 237.0000 mL | ORAL | Status: DC | PRN

## 2013-04-09 MED ORDER — SODIUM CHLORIDE 0.9 % IV SOLN
100.0000 mL | INTRAVENOUS | Status: DC | PRN
  Administered 2013-04-10: 07:00:00 via INTRAVENOUS

## 2013-04-09 MED ORDER — INSULIN ASPART 100 UNIT/ML ~~LOC~~ SOLN
0.0000 [IU] | Freq: Three times a day (TID) | SUBCUTANEOUS | Status: DC
  Administered 2013-04-09 – 2013-04-10 (×3): 3 [IU] via SUBCUTANEOUS
  Administered 2013-04-10: 2 [IU] via SUBCUTANEOUS
  Administered 2013-04-11: 3 [IU] via SUBCUTANEOUS
  Administered 2013-04-11: 2 [IU] via SUBCUTANEOUS
  Administered 2013-04-12 – 2013-04-13 (×3): 3 [IU] via SUBCUTANEOUS
  Administered 2013-04-14: 2 [IU] via SUBCUTANEOUS

## 2013-04-09 MED ORDER — DOXERCALCIFEROL 4 MCG/2ML IV SOLN: 1.0000 ug | INTRAVENOUS | Status: DC

## 2013-04-09 MED ORDER — DARBEPOETIN ALFA-POLYSORBATE 40 MCG/0.4ML IJ SOLN
INTRAMUSCULAR | Status: AC
  Filled 2013-04-09: qty 0.4

## 2013-04-09 MED ORDER — DARBEPOETIN ALFA-POLYSORBATE 40 MCG/0.4ML IJ SOLN
40.0000 ug | INTRAMUSCULAR | Status: DC
  Administered 2013-04-09: 40 ug via INTRAVENOUS
  Filled 2013-04-09: qty 0.4

## 2013-04-09 MED ORDER — LIDOCAINE HCL (PF) 1 % IJ SOLN: 5.0000 mL | INTRAMUSCULAR | Status: DC | PRN

## 2013-04-09 NOTE — Progress Notes (Signed)
[facility] PROGRESS NOTE  ABDIAZIZ KRETZER [id number] DOB: 1940-09-18 DOA: 04/08/2013 PCP: No PCP Per Patient  Brief narrative: Mr. Mccarley is a 73yo man with PMH of DM2, CAD, osteomyelitis, ESRD on HD, HTN s/p left BKA who presents for non healing foot wound on the right large toe and likely osteomyelitis.   Assessment/Plan  Principal Problem:  Diabetic osteomyelitis: - Shown by MRI on April 18, 2013 - Vascular surgery is consulted, plan for right BKA tomorrow (2/21) - Currently on vancomycin and zosyn prior to surgery - Seddrick work on achieving good glycemic control.  - WBC down to 10.9  Active Problems: ESRD on dialysis  - Consult to nephrology for continued HD, he is on MWF schedule - No specific metabolic derangements noted at this time.   CAD (coronary artery disease) - ASA continued along with statin and prn nitroglycerin  DM type 2 causing vascular disease - He is on NPH at home, 10 units with a sliding scalle - SSI has been initiated here, NPH can be restarted post surgery given likely NPO status - Monitor CBGs.   HTN (hypertension), benign - BP has ranged from [id number] systolic to [id number] since being here - Currently home lasix is held - Re-eval after dialysis   S/P BKA (below knee amputation) unilateral, left - No acute issue  HLD - Continue statin  DVT PPx: Heparin SQ  Consultants:  Vascular Surgery  nephrology   Procedures/Studies:  No results found.  MRI 04/18/13 IMPRESSION:  1. Marrow edema within the first proximal phalanx and first distal  phalanx without definite bone destruction. This may represent  reactive marrow edema versus osteomyelitis.  2. There is soft tissue edema along the dorsal aspect of the midfoot  and forefoot likely reflecting cellulitis. There is a metallic  foreign body in the web space between the first and second digits.  3 Small joint effusion of the first MTP joint which may be reactive  although infection cannot be  excluded. There are no marrow changes  in the first metatarsal head which would argue against infection.   Antibiotics:  Vancomycin  Zosyn  Code Status: Full Family Communication: Pt at bedside Disposition Plan: Pending surgery, likely Rasheem need SNF placement.   HPI/Subjective: No events overnight. He is doing well.  Interested as to whether he Deontrae get HD today.    Objective: Filed Vitals:   04/08/13 2130 04/08/13 2200 04/09/13 0508 04/09/13 0752  BP: 123/51 133/52 156/54 110/58  Pulse: 90 86 80 78  Temp:  100.6 F (38.1 C) 99.5 F (37.5 C) 99.6 F (37.6 C)  TempSrc:  Oral Oral Oral  Resp:  18 17 18   Height:  5\' 9"  (1.753 m)    Weight:  179 lb 14.3 oz (81.6 kg)    SpO2: 99% 96% 96% 90%   No intake or output data in the 24 hours ending 04/09/13 1017  Exam:   General:  Pt is alert, follows commands appropriately, not in acute distress  Cardiovascular: RR, NR, no murmur noted  Respiratory: Clear to auscultation bilaterally, no wheezing,  Abdomen: Soft, non tender, non distended, bowel sounds present  Extremities: No edema to right LE, LLE is s/p BKA stump is C/D/I.  Right great toe with dry gangrene with surrounding warmth/redness.  No drainage.   Neuro: Grossly nonfocal  Data Reviewed: Basic Metabolic Panel:  Recent Labs Lab 04/08/13 1909 04/09/13 0344  NA 137 132*  K 4.3 4.2  CL 90* 89*  CO2 29 28  GLUCOSE  159* 199*  BUN 40* 43*  CREATININE 5.51* 6.35*  CALCIUM 9.1 8.6   Liver Function Tests:  Recent Labs Lab 04/09/13 0344  AST 10  ALT 6  ALKPHOS 76  BILITOT 0.3  PROT 6.5  ALBUMIN 2.0*   No results found for this basename: LIPASE, AMYLASE,  in the last 168 hours No results found for this basename: AMMONIA,  in the last 168 hours CBC:  Recent Labs Lab 04/08/13 1909 04/09/13 0344  WBC 14.5* 10.9*  NEUTROABS 11.4*  --   HGB 11.1* 8.8*  HCT 33.4* 27.9*  MCV 92.3 92.7  PLT 336 287   Cardiac Enzymes: No results found for this  basename: CKTOTAL, CKMB, CKMBINDEX, TROPONINI,  in the last 168 hours BNP: No components found with this basename: POCBNP,  CBG:  Recent Labs Lab 04/08/13 2210 04/09/13 0751  GLUCAP 194* 167*    No results found for this or any previous visit (from the past 240 hour(s)).   Scheduled Meds: . aspirin EC  81 mg Oral QPM  . atorvastatin  80 mg Oral QPM  . heparin  5,000 Units Subcutaneous 3 times per day  . insulin aspart  0-15 Units Subcutaneous TID WC  . insulin aspart  0-5 Units Subcutaneous QHS  . piperacillin-tazobactam (ZOSYN)  IV  2.25 g Intravenous 3 times per day  . sevelamer carbonate  1,600 mg Oral BID WC  . vancomycin  750 mg Intravenous Q M,W,F-HD   Continuous Infusions:    Gilles Chiquito, MD  [place] Pager [phone]  If 7PM-7AM, please contact night-coverage [email or web address] Password [email or web address] 04/09/2013, 10:17 AM   LOS: 1 day

## 2013-04-09 NOTE — Progress Notes (Signed)
INITIAL NUTRITION ASSESSMENT  DOCUMENTATION CODES Per approved criteria  -Severe malnutrition in the context of chronic illness  Pt meets criteria for Severe MALNUTRITION in the context of Chronic Illness as evidenced by signs of severe muscle wasting in physical exam and 20% weight loss in 3 months per pt report.  INTERVENTION: Provide Nepro Carb Steady shake once daily Encourage PO intake Recommend daily Multivitamin, Nephro-vite  NUTRITION DIAGNOSIS: Increased nutrient needs related to wound healing and hemodialysis as evidenced by estimated energy/protein needs.   Goal: Pt to meet >/= 90% of their estimated nutrition needs    Monitor:  PO intake, weight trends, I/O's, labs  Reason for Assessment: Malnutrition Screening Tool, score of 4  73 y.o. male  Admitting Dx: Diabetic osteomyelitis  ASSESSMENT: 73 y.o. 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 The patient presents with complaints of fever with chills that has been ongoing since last to 3 days along with that he has some soreness on one of the right leg that has been ongoing since last 2 weeks and progressively worsening. Per MD note, pt may require amputation.  Pt reports that 3 months ago he weighed over 200 lbs and lost down to 160 lbs recently (20% weight loss). Per weight history below pt used to weight 191 lbs and lost down to 159 lbs- 17% weight loss.  He is unsure of cause of weight loss but, states he has been eating a little less than usual. He reports eating 2 meals daily, breakfast and dinner. He reports eating protein-rich foods daily. Pt states his appetite is good and he ate 100% of breakfast this morning; states he may be able to eat a little at lunch. Pt reports dry weight if 160 lbs. Encouraged pt to eat 3 meals daily and to continue eating protein-rich foods. Pt with obvious muscle  wasting. Pt agreeable to trying Nepro shakes.   Nutrition Focused Physical Exam:  Subcutaneous Fat:  Orbital Region: mild wasting Upper Arm Region: mild wasting Thoracic and Lumbar Region: NA  Muscle:  Temple Region: moderate wasting Clavicle Bone Region: severe wasting Clavicle and Acromion Bone Region: mild wasting Scapular Bone Region: NA Dorsal Hand: severe wasting Patellar Region: moderate wasting Anterior Thigh Region: moderate wasting Posterior Calf Region: moderate wasting  Edema: +1 RLE edema   Height: Ht Readings from Last 1 Encounters:  04/08/13 5\' 9"  (1.753 m)    Weight: Wt Readings from Last 1 Encounters:  04/08/13 179 lb 14.3 oz (81.6 kg)    Ideal Body Weight: 150 lbs  % Ideal Body Weight: 119%  Wt Readings from Last 10 Encounters:  04/08/13 179 lb 14.3 oz (81.6 kg)  01/26/13 159 lb (72.122 kg)  05/25/12 191 lb 2.2 oz (86.7 kg)  05/25/12 191 lb 2.2 oz (86.7 kg)  11/04/11 190 lb (86.183 kg)    Usual Body Weight: 200 lbs  % Usual Body Weight: 89%  BMI:  Body mass index is 26.55 kg/(m^2).  Estimated Nutritional Needs: Kcal: 2100-2300 Protein: 90-100 grams Fluid: 1200 ml restriction  Skin: +1 RLE edema; wound on right toe  Diet Order: Diabetic  EDUCATION NEEDS: -No education needs identified at this time  No intake or output data in the 24 hours ending 04/09/13 1244  Last BM: 2/19  Labs:   Recent Labs Lab 04/08/13 1909 04/09/13 0344  NA 137 132*  K 4.3 4.2  CL 90* 89*  CO2 29 28  BUN 40* 43*  CREATININE 5.51* 6.35*  CALCIUM 9.1 8.6  GLUCOSE 159* 199*    CBG (last 3)   Recent Labs  04/08/13 2210 04/09/13 0751  GLUCAP 194* 167*    Scheduled Meds: . aspirin EC  81 mg Oral QPM  . atorvastatin  80 mg Oral QPM  . heparin  5,000 Units Subcutaneous 3 times per day  . insulin aspart  0-15 Units Subcutaneous TID WC  . insulin aspart  0-5 Units Subcutaneous QHS  . piperacillin-tazobactam (ZOSYN)  IV  2.25 g Intravenous 3  times per day  . sevelamer carbonate  1,600 mg Oral BID WC  . vancomycin  750 mg Intravenous Q M,W,F-HD    Continuous Infusions:   Past Medical History  Diagnosis Date  . Diabetes mellitus   . Hypertension   . Gout   . Coronary artery disease   . Stroke   . Asthma   . Renal insufficiency   . Hypercholesteremia   . Arthritis   . DDD (degenerative disc disease), lumbar   . DDD (degenerative disc disease), cervical   . Gangrene of toe Feb. 2015    Right  great      Past Surgical History  Procedure Laterality Date  . Coronary artery bypass graft    . Below knee leg amputation Left   . Esophagogastroduodenoscopy Left 05/24/2012    [id number] dilated baggy but otherwise a normal/ Small hiatal hernia. Gastric ulcer -S/P biopsy    Pryor Ochoa RD, LDN Inpatient Clinical Dietitian Pager: [phone] After Hours Pager: [phone]

## 2013-04-09 NOTE — Consult Note (Signed)
Reason for Consult: Gangrene right forefoot Referring Physician: Drayk Humbarger is an 73 y.o. male.  HPI: Patient is a 73 year old gentleman with peripheral vascular disease end-stage renal disease on dialysis who presents with gangrene of the right great toe as well as fourth toe and fifth toe right foot he is status post transtibial amputation of the left.  Past Medical History  Diagnosis Date  . Diabetes mellitus   . Hypertension   . Gout   . Coronary artery disease   . Stroke   . Asthma   . Renal insufficiency   . Hypercholesteremia   . Arthritis   . DDD (degenerative disc disease), lumbar   . DDD (degenerative disc disease), cervical   . Gangrene of toe Feb. 2015    Right  great      Past Surgical History  Procedure Laterality Date  . Coronary artery bypass graft    . Below knee leg amputation Left   . Esophagogastroduodenoscopy Left 05/24/2012    [id number] dilated baggy but otherwise a normal/ Small hiatal hernia. Gastric ulcer -S/P biopsy    Family History  Problem Relation Age of Onset  . Colon cancer Neg Hx     Social History:  reports that he has never smoked. He has never used smokeless tobacco. He reports that he does not drink alcohol or use illicit drugs.  Allergies:  Allergies  Allergen Reactions  . Codeine Itching and Nausea And Vomiting  . Yellow Jacket Venom [Bee Venom] Swelling    Medications: I have reviewed the patient's current medications.  Results for orders placed during the hospital encounter of 04/08/13 (from the past 48 hour(s))  BASIC METABOLIC PANEL     Status: Abnormal   Collection Time    04/08/13  7:09 PM      Result Value Ref Range   Sodium 137  137 - 147 mEq/L   Potassium 4.3  3.7 - 5.3 mEq/L   Chloride 90 (*) 96 - 112 mEq/L   CO2 29  19 - 32 mEq/L   Glucose, Bld 159 (*) 70 - 99 mg/dL   BUN 40 (*) 6 - 23 mg/dL   Creatinine, Ser 5.51 (*) 0.50 - 1.35 mg/dL   Calcium 9.1  8.4 - 10.5 mg/dL   GFR calc non Af Amer 9  (*) >90 mL/min   GFR calc Af Amer 11 (*) >90 mL/min   Comment: (NOTE)     The eGFR has been calculated using the CKD EPI equation.     This calculation has not been validated in all clinical situations.     eGFR's persistently <90 mL/min signify possible Chronic Kidney     Disease.  CBC WITH DIFFERENTIAL     Status: Abnormal   Collection Time    04/08/13  7:09 PM      Result Value Ref Range   WBC 14.5 (*) 4.0 - 10.5 K/uL   RBC 3.62 (*) 4.22 - 5.81 MIL/uL   Hemoglobin 11.1 (*) 13.0 - 17.0 g/dL   HCT 33.4 (*) 39.0 - 52.0 %   MCV 92.3  78.0 - 100.0 fL   MCH 30.7  26.0 - 34.0 pg   MCHC 33.2  30.0 - 36.0 g/dL   RDW 14.0  11.5 - 15.5 %   Platelets 336  150 - 400 K/uL   Neutrophils Relative % 79 (*) 43 - 77 %   Neutro Abs 11.4 (*) 1.7 - 7.7 K/uL   Lymphocytes Relative 16  12 -  46 %   Lymphs Abs 2.3  0.7 - 4.0 K/uL   Monocytes Relative 5  3 - 12 %   Monocytes Absolute 0.7  0.1 - 1.0 K/uL   Eosinophils Relative 1  0 - 5 %   Eosinophils Absolute 0.1  0.0 - 0.7 K/uL   Basophils Relative 0  0 - 1 %   Basophils Absolute 0.1  0.0 - 0.1 K/uL  GLUCOSE, CAPILLARY     Status: Abnormal   Collection Time    04/08/13 10:10 PM      Result Value Ref Range   Glucose-Capillary 194 (*) 70 - 99 mg/dL  COMPREHENSIVE METABOLIC PANEL     Status: Abnormal   Collection Time    04/09/13  3:44 AM      Result Value Ref Range   Sodium 132 (*) 137 - 147 mEq/L   Potassium 4.2  3.7 - 5.3 mEq/L   Chloride 89 (*) 96 - 112 mEq/L   CO2 28  19 - 32 mEq/L   Glucose, Bld 199 (*) 70 - 99 mg/dL   BUN 43 (*) 6 - 23 mg/dL   Creatinine, Ser 6.35 (*) 0.50 - 1.35 mg/dL   Calcium 8.6  8.4 - 10.5 mg/dL   Total Protein 6.5  6.0 - 8.3 g/dL   Albumin 2.0 (*) 3.5 - 5.2 g/dL   AST 10  0 - 37 U/L   ALT 6  0 - 53 U/L   Alkaline Phosphatase 76  39 - 117 U/L   Total Bilirubin 0.3  0.3 - 1.2 mg/dL   GFR calc non Af Amer 8 (*) >90 mL/min   GFR calc Af Amer 9 (*) >90 mL/min   Comment: (NOTE)     The eGFR has been calculated  using the CKD EPI equation.     This calculation has not been validated in all clinical situations.     eGFR's persistently <90 mL/min signify possible Chronic Kidney     Disease.  CBC     Status: Abnormal   Collection Time    04/09/13  3:44 AM      Result Value Ref Range   WBC 10.9 (*) 4.0 - 10.5 K/uL   RBC 3.01 (*) 4.22 - 5.81 MIL/uL   Hemoglobin 8.8 (*) 13.0 - 17.0 g/dL   Comment: REPEATED TO VERIFY   HCT 27.9 (*) 39.0 - 52.0 %   MCV 92.7  78.0 - 100.0 fL   MCH 29.2  26.0 - 34.0 pg   MCHC 31.5  30.0 - 36.0 g/dL   RDW 14.3  11.5 - 15.5 %   Platelets 287  150 - 400 K/uL  PROTIME-INR     Status: None   Collection Time    04/09/13  3:44 AM      Result Value Ref Range   Prothrombin Time 14.5  11.6 - 15.2 seconds   INR 1.15  0.00 - 1.49    No results found.  Review of Systems  All other systems reviewed and are negative.   Blood pressure 110/58, pulse 78, temperature 99.6 F (37.6 C), temperature source Oral, resp. rate 18, height 5' 9"  (1.753 m), weight 81.6 kg (179 lb 14.3 oz), SpO2 90.00%. Physical Exam On examination patient has dry gangrene involving the right great toe right fourth toe and right fifth toe. There is no ulceration or cellulitis within the leg. Assessment/Plan: Assessment: Dry gangrene right great toe right fourth toe and right fifth toe.  Plan: Feel patient would  benefit from a transtibial amputation. Patient Alan [name] undergo his dialysis today and anticipate placing patient was scheduled for Saturday morning. Risks and benefits were discussed with the patient and his family including nonhealing of the wound need for additional surgery. Patient family state they understand and wish to proceed at this time.  [name],MARCUS V 04/09/2013, 7:52 AM

## 2013-04-09 NOTE — Consult Note (Signed)
Indication for Consultation:  Management of ESRD/hemodialysis; anemia, hypertension/volume and secondary hyperparathyroidism  HPI: Alan [name] is a 73 y.o. male admitted after an appointment with his wound care MD. He has been followed for about 3 months for a non healing wound on his R great toe and it has been decided he needs to have a RBKA, He has a previous L BKA 5 years ago. He has a history of PVD, DM, HTN, and HD MWF at [facility].   Past Medical History  Diagnosis Date  . Diabetes mellitus   . Hypertension   . Gout   . Coronary artery disease   . Stroke   . Asthma   . Renal insufficiency   . Hypercholesteremia   . Arthritis   . DDD (degenerative disc disease), lumbar   . DDD (degenerative disc disease), cervical   . Gangrene of toe Feb. 2015    Right  great     Past Surgical History  Procedure Laterality Date  . Coronary artery bypass graft    . Below knee leg amputation Left   . Esophagogastroduodenoscopy Left 05/24/2012    [id number] dilated baggy but otherwise a normal/ Small hiatal hernia. Gastric ulcer -S/P biopsy   Family History  Problem Relation Age of Onset  . Colon cancer Neg Hx    Social History:  reports that he has never smoked. He has never used smokeless tobacco. He reports that he does not drink alcohol or use illicit drugs. Allergies  Allergen Reactions  . Codeine Itching and Nausea And Vomiting  . Yellow Jacket Venom [Bee Venom] Swelling   Prior to Admission medications   Medication Sig Start Date End Date Taking? Authorizing Provider  Acetaminophen (TYLENOL PO) Take 1,200 mg by mouth 2 (two) times daily.   Yes Historical Provider, MD  aspirin EC 81 MG tablet Take 81 mg by mouth every evening.    Yes Historical Provider, MD  atorvastatin (LIPITOR) 80 MG tablet Take 80 mg by mouth every evening.   Yes Historical Provider, MD  B Complex-C-Folic Acid (NEPHRO-VITE PO) Take 1 tablet by mouth every evening.    Yes Historical Provider, MD   fluticasone (FLONASE) 50 MCG/ACT nasal spray Place 2 sprays into both nostrils daily as needed for allergies.    Yes Historical Provider, MD  furosemide (LASIX) 40 MG tablet Take 20 mg by mouth every evening.   Yes Historical Provider, MD  insulin NPH (HUMULIN N,NOVOLIN N) 100 UNIT/ML injection Inject 10 Units into the skin at bedtime. Use according to sliding scale.   Yes Historical Provider, MD  nitroGLYCERIN (NITROSTAT) 0.4 MG SL tablet Place 0.4 mg under the tongue every 5 (five) minutes as needed for chest pain.    Yes Historical Provider, MD  sevelamer carbonate (RENVELA) 800 MG tablet Take 1,600 mg by mouth 2 (two) times daily.    Yes Historical Provider, MD  sucralfate (CARAFATE) 1 GM/10ML suspension Take 10 mLs (1 g total) by mouth 3 (three) times daily with meals as needed (heartburn or indigestion). 05/25/12  Yes Nimish Luther Parody, MD   Current Facility-Administered Medications  Medication Dose Route Frequency Provider Last Rate Last Dose  . acetaminophen (TYLENOL) tablet 650 mg  650 mg Oral Q6H PRN Berle Mull, MD       Or  . acetaminophen (TYLENOL) suppository 650 mg  650 mg Rectal Q6H PRN Berle Mull, MD      . aspirin EC tablet 81 mg  81 mg Oral QPM Berle Mull, MD  81 mg at 04/08/13 2308  . atorvastatin (LIPITOR) tablet 80 mg  80 mg Oral QPM Berle Mull, MD   80 mg at 04/08/13 2308  . feeding supplement (NEPRO CARB STEADY) liquid 237 mL  237 mL Oral Q24H Baird Lyons, RD   237 mL at 04/09/13 1400  . fluticasone (FLONASE) 50 MCG/ACT nasal spray 2 spray  2 spray Each Nare Daily PRN Berle Mull, MD      . heparin injection 5,000 Units  5,000 Units Subcutaneous 3 times per day Berle Mull, MD   5,000 Units at 04/09/13 1320  . insulin aspart (novoLOG) injection 0-15 Units  0-15 Units Subcutaneous TID WC Ritta Slot, NP   3 Units at 04/09/13 1319  . insulin aspart (novoLOG) injection 0-5 Units  0-5 Units Subcutaneous QHS Berle Mull, MD      . nitroGLYCERIN (NITROSTAT) SL  tablet 0.4 mg  0.4 mg Sublingual Q5 min PRN Berle Mull, MD      . ondansetron (ZOFRAN) tablet 4 mg  4 mg Oral Q6H PRN Berle Mull, MD       Or  . ondansetron (ZOFRAN) injection 4 mg  4 mg Intravenous Q6H PRN Berle Mull, MD      . piperacillin-tazobactam (ZOSYN) IVPB 2.25 g  2.25 g Intravenous 3 times per day Rande Lawman Rumbarger, RPH   2.25 g at 04/09/13 1320  . sevelamer carbonate (RENVELA) tablet 1,600 mg  1,600 mg Oral BID WC Berle Mull, MD   1,600 mg at 04/09/13 0901  . sucralfate (CARAFATE) 1 GM/10ML suspension 1 g  1 g Oral TID WC PRN Berle Mull, MD      . vancomycin (VANCOCIN) IVPB 750 mg/150 ml premix  750 mg Intravenous Q M,W,F-HD Rande Lawman Rumbarger, [facility]       Labs: Basic Metabolic Panel:  Recent Labs Lab 04/08/13 1909 04/09/13 0344  NA 137 132*  K 4.3 4.2  CL 90* 89*  CO2 29 28  GLUCOSE 159* 199*  BUN 40* 43*  CREATININE 5.51* 6.35*  CALCIUM 9.1 8.6   Liver Function Tests:  Recent Labs Lab 04/09/13 0344  AST 10  ALT 6  ALKPHOS 76  BILITOT 0.3  PROT 6.5  ALBUMIN 2.0*   No results found for this basename: LIPASE, AMYLASE,  in the last 168 hours No results found for this basename: AMMONIA,  in the last 168 hours CBC:  Recent Labs Lab 04/08/13 1909 04/09/13 0344  WBC 14.5* 10.9*  NEUTROABS 11.4*  --   HGB 11.1* 8.8*  HCT 33.4* 27.9*  MCV 92.3 92.7  PLT 336 287   Cardiac Enzymes: No results found for this basename: CKTOTAL, CKMB, CKMBINDEX, TROPONINI,  in the last 168 hours CBG:  Recent Labs Lab 04/08/13 2210 04/09/13 0751 04/09/13 1216  GLUCAP 194* 167* 153*   Iron Studies: No results found for this basename: IRON, TIBC, TRANSFERRIN, FERRITIN,  in the last 72 hours Studies/Results: No results found.  ROS:  Review of Systems: Gen: Denies any fever, chills, sweats, anorexia, fatigue, weakness, malaise, weight loss, and sleep disorder. Feeling well except for foot pain HEENT: No visual complaints, No history of Retinopathy. Normal  external appearance No Epistaxis or Sore throat. No sinusitis.   CV: Reports occassional chest pain, pressure mid chest lasts a few seconds, resolves without intervention and no assiociated symptoms. Reports RLE edema. Denies palpitations, syncope, orthopnea, PND, and claudication. Resp: Denies dyspnea at rest, dyspnea with exercise, cough, sputum, wheezing, coughing up blood, and pleurisy.  GI: Denies vomiting blood, jaundice, and fecal incontinence.   Denies dysphagia or odynophagia. GU : Reports urinary burning. Denies blood in urine, urinary frequency, urinary hesitancy, nocturnal urination, and urinary incontinence.  No renal calculi. MS: Reports 3 month history of R foot pain, progressively worsening. Denies joint pain, limitation of movement, and swelling, stiffness, low back pain. Denies muscle weakness, cramps, atrophy.  No use of non steroidal antiinflammatory drugs. Derm: Reports nonhealing wound on R great toe, had been following at wound clinic. Denies rash, itching, dry skin, hives, moles, warts. Psych: Denies depression, anxiety, memory loss, suicidal ideation, hallucinations, paranoia, and confusion. Heme: Denies bruising, bleeding, and enlarged lymph nodes. Neuro: No headache.  No diplopia. No dysarthria.  No dysphasia.  No history of CVA.  No Seizures. No paresthesias.  No weakness. Endocrine: Is diabetic. Denies hypo/hyperglycemia.  Physical Exam: Filed Vitals:   04/08/13 2200 04/09/13 0508 04/09/13 0752 04/09/13 1218  BP: 133/52 156/54 110/58 128/63  Pulse: 86 80 78 76  Temp: 100.6 F (38.1 C) 99.5 F (37.5 C) 99.6 F (37.6 C) 98.5 F (36.9 C)  TempSrc: Oral Oral Oral Oral  Resp: 18 17 18 19   Height: 5\' 9"  (1.753 m)     Weight: 81.6 kg (179 lb 14.3 oz)     SpO2: 96% 96% 90% 93%     General: Well developed, well nourished, in no acute distress. Head: Normocephalic, atraumatic, sclera non-icteric, mucus membranes are moist Neck: Supple. JVD not elevated. Lungs: Clear  bilaterally to auscultation without wheezes, rales, or rhonchi. Breathing is unlabored. Heart: RRR with S1 S2. No murmurs, rubs, or gallops appreciated. Abdomen: Soft, non-tender, non-distended with normoactive bowel sounds. No rebound/guarding. No obvious abdominal masses. M-S:  Strength and tone appear normal for age. Lower extremities: LBKA, R great toe and 4th toe necrotic, no drainage. Foot warm. R +2 pedal edema, +1 RLE edema Neuro: Alert and oriented X 3. Moves all extremities spontaneously. Psych:  Responds to questions appropriately with a normal affect. Dialysis Access: LFA AVF +bruit/thrill  Dialysis Orders:  MWF @ [place] Awaiting records  Assessment/Plan: 1.  Osteomyelitis- Nonhealing, necrotic wound R great toe and 4th toe. Shown on previous MRI. Vascular surgery consulted. Plan for R BKA 2/21. Vanc and zosyn per pharm. Blood cultures pending. 2. ?UTI- reports buring with urination, UA ordered 3.  ESRD -  MWF@ [facility]. K+4.2 HD pending for today 4.  Hypertension/volume  - 128/63- unsure of home meds, looks like maybe lasix, held for now, does not need to be restarted. Watch BP for now 5.  Anemia  - hgb 8.8, start ESA, order iron panel 6.  Metabolic bone disease -  Ca+ 8.6 Renvela with meals 7.  Nutrition - Alb 2.0 Renal diet.Nepro, multivitamin. Encourage protein. 8. DM- SSI, per primary   Shelle Iron, NP [facility] [phone] 04/09/2013, 2:47 PM   Renal Attending: Pt with ESRD followed at [facility] admitted for BKA for tx of osteomyelitis.  Agree with above eval and management per [facility]. ACPowell, MD

## 2013-04-10 ENCOUNTER — Encounter (HOSPITAL_COMMUNITY): Payer: Medicare Other | Admitting: Anesthesiology

## 2013-04-10 ENCOUNTER — Encounter (HOSPITAL_COMMUNITY)
Admission: EM | Disposition: A | Discharge status: Home Health | Payer: Self-pay | Source: Home / Self Care | Attending: Internal Medicine

## 2013-04-10 ENCOUNTER — Inpatient Hospital Stay (HOSPITAL_COMMUNITY): Payer: Medicare Other | Admitting: Anesthesiology

## 2013-04-10 ENCOUNTER — Encounter (HOSPITAL_COMMUNITY): Payer: Self-pay | Admitting: Anesthesiology

## 2013-04-10 HISTORY — PX: AMPUTATION: SHX166

## 2013-04-10 LAB — CBC
HCT: 29.8 % — ABNORMAL LOW (ref 39.0–52.0)
HEMATOCRIT: 29.3 % — AB (ref 39.0–52.0)
HEMOGLOBIN: 9.4 g/dL — AB (ref 13.0–17.0)
Hemoglobin: 9.5 g/dL — ABNORMAL LOW (ref 13.0–17.0)
MCH: 29.5 pg (ref 26.0–34.0)
MCH: 29.8 pg (ref 26.0–34.0)
MCHC: 31.9 g/dL (ref 30.0–36.0)
MCHC: 32.1 g/dL (ref 30.0–36.0)
MCV: 92.5 fL (ref 78.0–100.0)
MCV: 93 fL (ref 78.0–100.0)
PLATELETS: 274 10*3/uL (ref 150–400)
Platelets: 280 10*3/uL (ref 150–400)
RBC: 3.22 MIL/uL — ABNORMAL LOW (ref 4.22–5.81)
RBC: 3.15 MIL/uL — ABNORMAL LOW (ref 4.22–5.81)
RDW: 13.9 % (ref 11.5–15.5)
RDW: 14.2 % (ref 11.5–15.5)
WBC: 11.7 10*3/uL — ABNORMAL HIGH (ref 4.0–10.5)
WBC: 10.6 10*3/uL — AB (ref 4.0–10.5)

## 2013-04-10 LAB — GLUCOSE, CAPILLARY
GLUCOSE-CAPILLARY: 150 mg/dL — AB (ref 70–99)
Glucose-Capillary: 144 mg/dL — ABNORMAL HIGH (ref 70–99)
Glucose-Capillary: 138 mg/dL — ABNORMAL HIGH (ref 70–99)
Glucose-Capillary: 176 mg/dL — ABNORMAL HIGH (ref 70–99)
Glucose-Capillary: 146 mg/dL — ABNORMAL HIGH (ref 70–99)
Glucose-Capillary: 206 mg/dL — ABNORMAL HIGH (ref 70–99)

## 2013-04-10 LAB — BASIC METABOLIC PANEL
BUN: 22 mg/dL (ref 6–23)
CALCIUM: 8.9 mg/dL (ref 8.4–10.5)
CO2: 28 mEq/L (ref 19–32)
CREATININE: 4.13 mg/dL — AB (ref 0.50–1.35)
Chloride: 93 mEq/L — ABNORMAL LOW (ref 96–112)
GFR, EST AFRICAN AMERICAN: 15 mL/min — AB (ref >90)
GFR, EST NON AFRICAN AMERICAN: 13 mL/min — AB (ref >90)
Glucose, Bld: 163 mg/dL — ABNORMAL HIGH (ref 70–99)
Potassium: 3.7 mEq/L (ref 3.7–5.3)
Sodium: 135 mEq/L — ABNORMAL LOW (ref 137–147)

## 2013-04-10 SURGERY — AMPUTATION BELOW KNEE
Anesthesia: General | Site: Knee | Laterality: Right

## 2013-04-10 MED ORDER — ONDANSETRON HCL 4 MG/2ML IJ SOLN: 4.0000 mg | Freq: Four times a day (QID) | INTRAMUSCULAR | Status: DC | PRN

## 2013-04-10 MED ORDER — HYDROMORPHONE HCL PF 1 MG/ML IJ SOLN
INTRAMUSCULAR | Status: AC
  Administered 2013-04-10: 0.25 mg via INTRAVENOUS
  Filled 2013-04-10: qty 1

## 2013-04-10 MED ORDER — OXYCODONE-ACETAMINOPHEN 5-325 MG PO TABS
ORAL_TABLET | ORAL | Status: AC
  Administered 2013-04-10: 2 via ORAL
  Filled 2013-04-10: qty 2

## 2013-04-10 MED ORDER — HYDROMORPHONE HCL PF 1 MG/ML IJ SOLN
0.2500 mg | INTRAMUSCULAR | Status: DC | PRN
  Administered 2013-04-10 (×2): 0.25 mg via INTRAVENOUS

## 2013-04-10 MED ORDER — ONDANSETRON HCL 4 MG PO TABS: 4.0000 mg | ORAL_TABLET | Freq: Four times a day (QID) | ORAL | Status: DC | PRN

## 2013-04-10 MED ORDER — LIDOCAINE HCL (CARDIAC) 20 MG/ML IV SOLN
INTRAVENOUS | Status: DC | PRN
  Administered 2013-04-10: 70 mg via INTRAVENOUS

## 2013-04-10 MED ORDER — PROPOFOL 10 MG/ML IV BOLUS
INTRAVENOUS | Status: DC | PRN
  Administered 2013-04-10: 170 mg via INTRAVENOUS

## 2013-04-10 MED ORDER — SODIUM CHLORIDE 0.9 % IV SOLN
INTRAVENOUS | Status: DC
  Administered 2013-04-10: 20 mL/h via INTRAVENOUS

## 2013-04-10 MED ORDER — METOCLOPRAMIDE HCL 5 MG PO TABS: 5.0000 mg | ORAL_TABLET | Freq: Three times a day (TID) | ORAL | Status: DC | PRN

## 2013-04-10 MED ORDER — OXYCODONE-ACETAMINOPHEN 5-325 MG PO TABS
1.0000 | ORAL_TABLET | ORAL | Status: DC | PRN
  Administered 2013-04-10 – 2013-04-13 (×9): 2 via ORAL
  Filled 2013-04-10 (×9): qty 2

## 2013-04-10 MED ORDER — SODIUM CHLORIDE 0.9 % IV BOLUS (SEPSIS)
250.0000 mL | Freq: Once | INTRAVENOUS | Status: AC
  Administered 2013-04-10: 250 mL via INTRAVENOUS

## 2013-04-10 MED ORDER — METOCLOPRAMIDE HCL 5 MG/ML IJ SOLN: 5.0000 mg | Freq: Three times a day (TID) | INTRAMUSCULAR | Status: DC | PRN

## 2013-04-10 MED ORDER — SODIUM CHLORIDE 0.9 % IV BOLUS (SEPSIS)
250.0000 mL | Freq: Once | INTRAVENOUS | Status: AC
Start: 2013-04-10 — End: 2013-04-10
  Administered 2013-04-10: 250 mL via INTRAVENOUS

## 2013-04-10 MED ORDER — HYDROMORPHONE HCL PF 1 MG/ML IJ SOLN
0.5000 mg | INTRAMUSCULAR | Status: DC | PRN
  Administered 2013-04-10 – 2013-04-11 (×5): 1 mg via INTRAVENOUS
  Filled 2013-04-10 (×7): qty 1

## 2013-04-10 MED ORDER — EPHEDRINE SULFATE 50 MG/ML IJ SOLN: INTRAMUSCULAR | Status: DC | PRN

## 2013-04-10 MED ORDER — FENTANYL CITRATE 0.05 MG/ML IJ SOLN
INTRAMUSCULAR | Status: DC | PRN
  Administered 2013-04-10 (×3): 25 ug via INTRAVENOUS
  Administered 2013-04-10: 50 ug via INTRAVENOUS
  Administered 2013-04-10: 25 ug via INTRAVENOUS
  Administered 2013-04-10: 100 ug via INTRAVENOUS

## 2013-04-10 MED ORDER — 0.9 % SODIUM CHLORIDE (POUR BTL) OPTIME
TOPICAL | Status: DC | PRN
  Administered 2013-04-10: 1000 mL

## 2013-04-10 MED ORDER — LIDOCAINE HCL (CARDIAC) 20 MG/ML IV SOLN
INTRAVENOUS | Status: AC
  Filled 2013-04-10: qty 5

## 2013-04-10 MED ORDER — FENTANYL CITRATE 0.05 MG/ML IJ SOLN
INTRAMUSCULAR | Status: AC
  Filled 2013-04-10: qty 5

## 2013-04-10 MED ORDER — SODIUM CHLORIDE 0.9 % IV SOLN
INTRAVENOUS | Status: DC
  Administered 2013-04-10: 07:00:00 via INTRAVENOUS

## 2013-04-10 MED ORDER — PROPOFOL 10 MG/ML IV BOLUS
INTRAVENOUS | Status: AC
  Filled 2013-04-10: qty 20

## 2013-04-10 MED ORDER — ONDANSETRON HCL 4 MG/2ML IJ SOLN: 4.0000 mg | Freq: Once | INTRAMUSCULAR | Status: DC | PRN

## 2013-04-10 SURGICAL SUPPLY — 46 items
BANDAGE ESMARK 6X9 LF (GAUZE/BANDAGES/DRESSINGS) ×1 IMPLANT
BANDAGE GAUZE ELAST BULKY 4 IN (GAUZE/BANDAGES/DRESSINGS) ×6 IMPLANT
BLADE SAW RECIP 87.9 MT (BLADE) ×3 IMPLANT
BLADE SURG 21 STRL SS (BLADE) ×3 IMPLANT
BNDG CMPR 9X6 STRL LF SNTH (GAUZE/BANDAGES/DRESSINGS) ×1
BNDG COHESIVE 6X5 TAN STRL LF (GAUZE/BANDAGES/DRESSINGS) ×3 IMPLANT
BNDG ESMARK 6X9 LF (GAUZE/BANDAGES/DRESSINGS) ×3
CLOTH BEACON ORANGE TIMEOUT ST (SAFETY) ×3 IMPLANT
COVER SURGICAL LIGHT HANDLE (MISCELLANEOUS) ×3 IMPLANT
CUFF TOURNIQUET SINGLE 34IN LL (TOURNIQUET CUFF) ×3 IMPLANT
CUFF TOURNIQUET SINGLE 44IN (TOURNIQUET CUFF) IMPLANT
DRAIN PENROSE 1/2X12 LTX STRL (WOUND CARE) IMPLANT
DRAPE EXTREMITY T 121X128X90 (DRAPE) ×3 IMPLANT
DRAPE PROXIMA HALF (DRAPES) ×6 IMPLANT
DRAPE U-SHAPE 47X51 STRL (DRAPES) ×6 IMPLANT
DRSG ADAPTIC 3X8 NADH LF (GAUZE/BANDAGES/DRESSINGS) ×3 IMPLANT
DRSG PAD ABDOMINAL 8X10 ST (GAUZE/BANDAGES/DRESSINGS) ×3 IMPLANT
DURAPREP 26ML APPLICATOR (WOUND CARE) ×3 IMPLANT
ELECT REM PT RETURN 9FT ADLT (ELECTROSURGICAL) ×3
ELECTRODE REM PT RTRN 9FT ADLT (ELECTROSURGICAL) ×1 IMPLANT
GLOVE BIOGEL PI IND STRL 9 (GLOVE) ×1 IMPLANT
GLOVE BIOGEL PI INDICATOR 9 (GLOVE) ×2
GLOVE SURG ORTHO 9.0 STRL STRW (GLOVE) ×3 IMPLANT
GOWN PREVENTION PLUS XLARGE (GOWN DISPOSABLE) ×3 IMPLANT
GOWN SRG XL XLNG 56XLVL 4 (GOWN DISPOSABLE) ×1 IMPLANT
GOWN STRL NON-REIN XL XLG LVL4 (GOWN DISPOSABLE) ×2
KIT BASIN OR (CUSTOM PROCEDURE TRAY) ×3 IMPLANT
KIT ROOM TURNOVER OR (KITS) ×3 IMPLANT
MANIFOLD NEPTUNE II (INSTRUMENTS) ×3 IMPLANT
NS IRRIG 1000ML POUR BTL (IV SOLUTION) ×3 IMPLANT
PACK GENERAL/GYN (CUSTOM PROCEDURE TRAY) ×3 IMPLANT
PAD ABD 8X10 STRL (GAUZE/BANDAGES/DRESSINGS) ×6 IMPLANT
PAD ARMBOARD 7.5X6 YLW CONV (MISCELLANEOUS) ×6 IMPLANT
SPONGE GAUZE 4X4 12PLY (GAUZE/BANDAGES/DRESSINGS) ×3 IMPLANT
SPONGE GAUZE 4X4 12PLY STER LF (GAUZE/BANDAGES/DRESSINGS) ×3 IMPLANT
SPONGE LAP 18X18 X RAY DECT (DISPOSABLE) IMPLANT
STAPLER VISISTAT 35W (STAPLE) IMPLANT
STOCKINETTE IMPERVIOUS LG (DRAPES) ×3 IMPLANT
SUT PDS AB 1 CT  36 (SUTURE)
SUT PDS AB 1 CT 36 (SUTURE) IMPLANT
SUT SILK 2 0 (SUTURE) ×6
SUT SILK 2-0 18XBRD TIE 12 (SUTURE) ×2 IMPLANT
TOWEL OR 17X24 6PK STRL BLUE (TOWEL DISPOSABLE) ×3 IMPLANT
TOWEL OR 17X26 10 PK STRL BLUE (TOWEL DISPOSABLE) ×3 IMPLANT
TUBE ANAEROBIC SPECIMEN COL (MISCELLANEOUS) IMPLANT
WATER STERILE IRR 1000ML POUR (IV SOLUTION) ×3 IMPLANT

## 2013-04-10 NOTE — Anesthesia Preprocedure Evaluation (Addendum)
Anesthesia Evaluation  Patient identified by MRN, date of birth, ID band Patient awake    Reviewed: Allergy & Precautions, H&P , NPO status , Patient's Chart, lab work & pertinent test results  Airway Mallampati: I TM Distance: >3 FB Neck ROM: Full    Dental  (+) Dental Advisory Given, Teeth Intact, Missing   Pulmonary asthma ,          Cardiovascular hypertension, Pt. on medications + CAD, + CABG and + Peripheral Vascular Disease     Neuro/Psych CVA, No Residual Symptoms    GI/Hepatic hiatal hernia,   Endo/Other  diabetes, Well Controlled, Type 2, Insulin Dependent  Renal/GU ESRF and DialysisRenal disease     Musculoskeletal   Abdominal   Peds  Hematology   Anesthesia Other Findings   Reproductive/Obstetrics                         Anesthesia Physical Anesthesia Plan  ASA: III  Anesthesia Plan: General   Post-op Pain Management:    Induction: Intravenous  Airway Management Planned: LMA and Oral ETT  Additional Equipment:   Intra-op Plan:   Post-operative Plan: Extubation in OR  Informed Consent: I have reviewed the patients History and Physical, chart, labs and discussed the procedure including the risks, benefits and alternatives for the proposed anesthesia with the patient or authorized representative who has indicated his/her understanding and acceptance.   Dental advisory given  Plan Discussed with:   Anesthesia Plan Comments:        Anesthesia Quick Evaluation

## 2013-04-10 NOTE — Progress Notes (Addendum)
[facility] PROGRESS NOTE  Alan [name] [id number] DOB: 29-Jul-1940 DOA: 04/08/2013 PCP: No PCP Per Patient  Brief narrative: Alan [name] is a 73yo man with PMH of DM2, CAD, osteomyelitis, ESRD on HD, HTN s/p left BKA who presents for non healing foot wound on the right large toe and likely osteomyelitis.   Assessment/Plan  Principal Problem:  Diabetic osteomyelitis: - s/p R BKA by Dr Sharol Given today - continuing on vanc/zosyn    - percocet for mild and dilaudid for severe pain prn   Active Problems: ESRD on dialysis  - Consult to nephrology for continued HD, he is on MWF schedule - No specific metabolic derangements noted at this time.   CAD (coronary artery disease) - ASA continued along with statin and prn nitroglycerin  DM type 2 causing vascular disease - He is on NPH at home, 10 units with a sliding scalle - SSI has been initiated here, NPH can be restarted post surgery given likely NPO status - Monitor CBGs.   HTN (hypertension), benign - BP has ranged from [id number] systolic to [id number] since being here - Currently home lasix is held - Re-eval after dialysis   S/P BKA (below knee amputation) unilateral, left - No acute issue  HLD - Continue statin  DVT PPx: Heparin SQ  Consultants:  Vascular Surgery  nephrology   Procedures/Studies: No results found.  MRI 03/23/13 IMPRESSION:  1. Marrow edema within the first proximal phalanx and first distal  phalanx without definite bone destruction. This may represent  reactive marrow edema versus osteomyelitis.  2. There is soft tissue edema along the dorsal aspect of the midfoot  and forefoot likely reflecting cellulitis. There is a metallic  foreign body in the web space between the first and second digits.  3 Small joint effusion of the first MTP joint which may be reactive  although infection cannot be excluded. There are no marrow changes  in the first metatarsal head which would argue against  infection.   Antibiotics:  Vancomycin  Zosyn  Code Status: Full Family Communication: Alan [name] at bedside Disposition Plan: Pending surgery, likely [place] need SNF placement.   HPI/Subjective: Postop today. Resting comfortably   Objective: Filed Vitals:   04/10/13 0812 04/10/13 0830 04/10/13 0845 04/10/13 0900  BP: 117/48 108/47 111/52   Pulse: 75 73 69   Temp: 98.6 F (37 C)   98.5 F (36.9 C)  TempSrc:      Resp: 16 18 16    Height:      Weight:      SpO2: 95% 95% 99%     Intake/Output Summary (Last 24 hours) at 04/10/13 0948 Last data filed at 04/10/13 0810  Gross per 24 hour  Intake    100 ml  Output   3150 ml  Net  -3050 ml    Exam:   General:  Alan [name] is alert, follows commands appropriately, not in acute distress  Cardiovascular: RR, NR, no murmur noted  Respiratory: Clear to auscultation bilaterally, no wheezing,  Abdomen: Soft, non tender, non distended, bowel sounds present  Extremities: RLE s/p BKA w/ bandage in place, LLE is s/p BKA stump is C/D/I.  Right great toe with dry gangrene with surrounding warmth/redness.  No drainage.   Neuro: Grossly nonfocal  Data Reviewed: Basic Metabolic Panel:  Recent Labs Lab 04/08/13 1909 04/09/13 0344 04/09/13 1648 04/10/13 0350  NA 137 132* 132* 135*  K 4.3 4.2 4.5 3.7  CL 90* 89* 88* 93*  CO2 29 28 27  28  GLUCOSE 159* 199* 220* 163*  BUN 40* 43* 53* 22  CREATININE 5.51* 6.35* 7.13* 4.13*  CALCIUM 9.1 8.6 8.5 8.9  PHOS  --   --  3.4  --    Liver Function Tests:  Recent Labs Lab 04/09/13 0344 04/09/13 1648  AST 10  --   ALT 6  --   ALKPHOS 76  --   BILITOT 0.3  --   PROT 6.5  --   ALBUMIN 2.0* 2.0*   No results found for this basename: LIPASE, AMYLASE,  in the last 168 hours No results found for this basename: AMMONIA,  in the last 168 hours CBC:  Recent Labs Lab 04/08/13 1909 04/09/13 0344 04/10/13 0350  WBC 14.5* 10.9* 11.7*  NEUTROABS 11.4*  --   --   HGB 11.1* 8.8* 9.5*  HCT 33.4*  27.9* 29.8*  MCV 92.3 92.7 92.5  PLT 336 287 274   Cardiac Enzymes: No results found for this basename: CKTOTAL, CKMB, CKMBINDEX, TROPONINI,  in the last 168 hours BNP: No components found with this basename: POCBNP,  CBG:  Recent Labs Lab 04/09/13 0751 04/09/13 1216 04/09/13 2110 04/10/13 0641 04/10/13 0820  GLUCAP 167* 153* 178* 144* 138*    Recent Results (from the past 240 hour(s))  CULTURE, BLOOD (ROUTINE X 2)     Status: None   Collection Time    04/08/13  9:03 PM      Result Value Ref Range Status   Specimen Description BLOOD FOREARM RIGHT   Final   Special Requests BOTTLES DRAWN AEROBIC AND ANAEROBIC 10CC   Final   Culture  Setup Time     Final   Value: 04/09/2013 04:16     Performed at [facility]   Culture     Final   Value: GRAM NEGATIVE RODS     Note: Gram Stain Report Called to,Read Back By and Verified With: Lorain Childes 04/09/13 1525 BY SMITHERSJ Gram Stain Report Called to,Read Back By and Verified With: REBECCA QIU 04/09/13 AT 8:25PM [place]     Performed at [facility]   Report Status PENDING   Incomplete  CULTURE, BLOOD (ROUTINE X 2)     Status: None   Collection Time    04/08/13  9:12 PM      Result Value Ref Range Status   Specimen Description BLOOD HAND RIGHT   Final   Special Requests BOTTLES DRAWN AEROBIC AND ANAEROBIC 10CC   Final   Culture  Setup Time     Final   Value: 04/09/2013 04:16     Performed at [facility]   Culture     Final   Value: GRAM NEGATIVE RODS     Note: Gram Stain Report Called to,Read Back By and Verified With: REBECCA QIU 04/09/13 AT 8:25PM [place]     Performed at [facility]   Report Status PENDING   Incomplete     Scheduled Meds: . aspirin EC  81 mg Oral QPM  . atorvastatin  80 mg Oral QPM  . darbepoetin  40 mcg Intravenous Q Fri-HD  . [START ON 04/12/2013] doxercalciferol  1 mcg Intravenous Q M,W,F-HD  . feeding supplement (NEPRO CARB STEADY)  237 mL Oral Q24H  . heparin  5,000  Units Subcutaneous 3 times per day  . insulin aspart  0-15 Units Subcutaneous TID WC  . insulin aspart  0-5 Units Subcutaneous QHS  . multivitamin  1 tablet Oral QHS  . piperacillin-tazobactam (ZOSYN)  IV  2.25 g Intravenous 3 times per day  . sevelamer carbonate  1,600 mg Oral BID WC  . vancomycin  750 mg Intravenous Q M,W,F-HD   Continuous Infusions: . sodium chloride 20 mL/hr at 04/10/13 0706  . sodium chloride       Velna Hatchet, MD  [facility] Pager [phone]  If 7PM-7AM, please contact night-coverage [email or web address] Password [facility] 04/10/2013, 9:48 AM   LOS: 2 days

## 2013-04-10 NOTE — Anesthesia Procedure Notes (Addendum)
Procedure Name: LMA Insertion Date/Time: 04/10/2013 7:31 AM Performed by: Marinda Elk A Pre-anesthesia Checklist: Patient identified, Timeout performed, Emergency Drugs available, Suction available and Patient being monitored Patient Re-evaluated:Patient Re-evaluated prior to inductionOxygen Delivery Method: Circle system utilized Preoxygenation: Pre-oxygenation with 100% oxygen Intubation Type: IV induction LMA: LMA inserted LMA Size: 5.0 Number of attempts: 1 Placement Confirmation: positive ETCO2 and breath sounds checked- equal and bilateral Tube secured with: Tape Dental Injury: Teeth and Oropharynx as per pre-operative assessment

## 2013-04-10 NOTE — Anesthesia Postprocedure Evaluation (Signed)
  Anesthesia Post-op Note  Patient: Alan [name]  Procedure(s) Performed: Procedure(s) with comments: Right Below Knee Amputation (Right) - Right Below Knee Amputation  Patient Location: PACU  Anesthesia Type:General  Level of Consciousness: awake, alert , sedated and patient cooperative  Airway and Oxygen Therapy: Patient Spontanous Breathing  Post-op Pain: mild  Post-op Assessment: Post-op Vital signs reviewed, Patient's Cardiovascular Status Stable, Respiratory Function Stable, Patent Airway, No signs of Nausea or vomiting and Pain level controlled  Post-op Vital Signs: stable  Complications: No apparent anesthesia complications

## 2013-04-10 NOTE — Op Note (Signed)
OPERATIVE REPORT  DATE OF SURGERY: 04/10/2013  PATIENT:  Alan [name],  73 y.o. male  PRE-OPERATIVE DIAGNOSIS:  Gangrene Right Great Toe, 4th, and 5th  POST-OPERATIVE DIAGNOSIS:  Gangrene Right Great Toe, 4th, and 5th  PROCEDURE:  Procedure(s): Right Below Knee Amputation  SURGEON:  Surgeon(s): Newt Minion, MD  ANESTHESIA:   general  EBL:  min ML  SPECIMEN:  Source of Specimen:  right leg  TOURNIQUET:   Total Tourniquet Time Documented: Thigh (Right) - 7 minutes Total: Thigh (Right) - 7 minutes   PROCEDURE DETAILS: Patient is a 73 year old gentleman severe peripheral vascular disease status post plenty of occasional left presents with gangrene of the right foot. Do to no revascularization options patient presents at this time for transtibial amputation. Risks and benefits were discussed including risk of high level amputation 9 healing of the wound infection. Patient states he understands and wishes to proceed at this time. Description of procedure patient was brought to the operating room and underwent a general anesthetic. After adequate levels and anesthesia obtained patient's right lower extremity was prepped using DuraPrep draped into a sterile field and the foot was draped out of the sterile field with an impervious stocking at. A transverse incision was made 11 cm distal to the tibial tubercle this curved proximally and a large posterior flap was created. The tibia was transected and beveled just proximal to the skin incision and the fibula was transected just proximal to the tibial incision. A knife was used to create a large posterior flap. The sciatic nerve was pulled cut and allowed to retract the vascular bundles were suture ligated with 2-0 silk. The tourniquet was deflated hemostasis was obtained. The deep and superficial fascial layers were closed using #1 PDS. The skin was closed using staples. Wound was covered with Adaptic orthopedic sponges AB dressing Kerlix and  Coban. Patient was extubated taken to the PACU in stable condition.  PLAN OF CARE: Admit to inpatient   PATIENT DISPOSITION:  PACU - hemodynamically stable.   Newt Minion, MD 04/10/2013 8:10 AM

## 2013-04-10 NOTE — Transfer of Care (Signed)
Immediate Anesthesia Transfer of Care Note  Patient: Alan [name]  Procedure(s) Performed: Procedure(s) with comments: Right Below Knee Amputation (Right) - Right Below Knee Amputation  Patient Location: PACU  Anesthesia Type:General  Level of Consciousness: awake  Airway & Oxygen Therapy: Patient Spontanous Breathing  Post-op Assessment: Report given to PACU RN and Post -op Vital signs reviewed and stable  Post vital signs: Reviewed and stable  Complications: No apparent anesthesia complications

## 2013-04-10 NOTE — Evaluation (Signed)
Physical Therapy Evaluation Patient Details Name: Alan [name] MRN: [id number] DOB: 1940/07/01 Today's Date: 04/10/2013 Time: 1201-1213 PT Time Calculation (min): 12 min  PT Assessment / Plan / Recommendation History of Present Illness  Alan [name] is a 73yo man with PMH of DM2, CAD, osteomyelitis, ESRD on HD, HTN s/p left BKA who presents for non healing foot wound on the right large toe and likely osteomyelitis now s/p R BKA  Clinical Impression  AlanFisher is very pleasant and has not been able to use prosthesis and ambulate for several months due to RLE pain. Pt reports all necessary DME for return home and family assist. REcommend HHPT to maximize safety with transfers and then defer therapy to OPPT for prosthetic training when appropriate. Pt with below deficits and Adyn benefit from acute therapy to maximize mobility and strength prior to DC to increase independence.      PT Assessment  Patient needs continued PT services    Follow Up Recommendations  Home health PT    Does the patient have the potential to tolerate intense rehabilitation      Barriers to Discharge        Equipment Recommendations  None recommended by PT    Recommendations for Other Services     Frequency Min 3X/week    Precautions / Restrictions Precautions Precautions: Fall Precaution Comments: bil BKA Restrictions Weight Bearing Restrictions: Yes RLE Weight Bearing: Non weight bearing   Pertinent Vitals/Pain No pain VSS      Mobility  Bed Mobility Overal bed mobility: Modified Independent Transfers Overall transfer level: Needs assistance Transfers: Lateral/Scoot Transfers  Lateral/Scoot Transfers: Min guard General transfer comment: cueing for sequence with assist for setup and use of drop arm chair for pt to pivot bed to chair sliding over. pt continues to try to lean posteriorly with scooting and needed max cues for anterior translation    Exercises Amputee Exercises Quad Sets:  AROM;5 reps;Supine;Right Hip Extension: AROM;5 reps;Supine;Right Hip ABduction/ADduction: AROM;5 reps;Supine;Right   PT Diagnosis: Difficulty walking  PT Problem List: Decreased activity tolerance;Decreased mobility;Decreased knowledge of use of DME PT Treatment Interventions: Functional mobility training;Therapeutic exercise;Patient/family education;Balance training     PT Goals(Current goals can be found in the care plan section) Acute Rehab PT Goals Patient Stated Goal: return home PT Goal Formulation: With patient/family Time For Goal Achievement: 04/17/13 Potential to Achieve Goals: Good  Visit Information  Last PT Received On: 04/10/13 Assistance Needed: +1 History of Present Illness: Alan [name] is a 73yo man with PMH of DM2, CAD, osteomyelitis, ESRD on HD, HTN s/p left BKA who presents for non healing foot wound on the right large toe and likely osteomyelitis now s/p R BKA       Prior Functioning  Home Living Family/patient expects to be discharged to:: Private residence Living Arrangements: Spouse/significant other;Children Available Help at Discharge: Family;Available 24 hours/day Type of Home: House Home Access: Level entry Home Layout: One level Home Equipment: Walker - 2 wheels;Bedside commode;Wheelchair - Education officer, community - power;Other (comment) (transfer board) Prior Function Level of Independence: Needs assistance Gait / Transfers Assistance Needed: non ambulatory x 5 mo due to pain in RLE. Transfers to [facility] on his own ADL's / Homemaking Assistance Needed: family does housework. Pt able to dress and bathe on his own Communication Communication: No difficulties    Cognition  Cognition Arousal/Alertness: Awake/alert Behavior During Therapy: WFL for tasks assessed/performed Overall Cognitive Status: Within Functional Limits for tasks assessed    Extremity/Trunk Assessment Upper Extremity Assessment Upper Extremity  Assessment: Overall WFL for tasks  assessed Lower Extremity Assessment Lower Extremity Assessment: Overall WFL for tasks assessed Cervical / Trunk Assessment Cervical / Trunk Assessment: Normal   Balance Balance Overall balance assessment: Modified Independent  End of Session PT - End of Session Activity Tolerance: Patient tolerated treatment well Patient left: in chair;with call bell/phone within reach;with family/visitor present;with nursing/sitter in room Nurse Communication: Mobility status  GP     Melford Aase 04/10/2013, 1:09 PM Elwyn Reach, [place]

## 2013-04-10 NOTE — Interval H&P Note (Signed)
History and Physical Interval Note:  04/10/2013 7:15 AM  Alan [name]  has presented today for surgery, with the diagnosis of Gangrene Right Great Toe, 4th, and 5th  The various methods of treatment have been discussed with the patient and family. After consideration of risks, benefits and other options for treatment, the patient has consented to  Procedure(s) with comments: Right Below Knee Amputation (Right) - Right Below Knee Amputation as a surgical intervention .  The patient's history has been reviewed, patient examined, no change in status, stable for surgery.  I have reviewed the patient's chart and labs.  Questions were answered to the patient's satisfaction.     Kaushik Maul V

## 2013-04-10 NOTE — Progress Notes (Signed)
At 1010 am patients BP 75/43, P 67,and asymptomatic-no dizziness or lightheadedness noted. Notified Holwerda, MD given order for bolus 250 ml, and recheck BP. Rechecked BP at 1110 am 94/43, 62. Notified Holwerda, MD given order for bolus 250 ml and cbc. At 1215 pm 98/40, 64 patient continues to be asymptomatic. Notified Holwerda, MD.  At 1500 pm BP 105/56, P 61. Akshat continue to monitor patient.

## 2013-04-10 NOTE — Progress Notes (Signed)
Assessment/Plan:  1. Osteomyelitis s/p BKA this AM- Vanc and zosyn per pharm. Blood cultures pending. 2. ?UTI- reports buring with urination, UA ordered 3. ESRD - MWF@ [facility].   Subjective: Interval History: s/p amputation  Objective: Vital signs in last 24 hours: Temp:  [98.3 F (36.8 C)-100 F (37.8 C)] 98.6 F (37 C) (02/21 1005) Pulse Rate:  [58-110] 65 (02/21 1305) Resp:  [16-18] 17 (02/21 1005) BP: (89-183)/(40-82) 98/45 mmHg (02/21 1305) SpO2:  [95 %-99 %] 98 % (02/21 1005) Weight:  [79.4 kg (175 lb 0.7 oz)-82.7 kg (182 lb 5.1 oz)] 80 kg (176 lb 5.9 oz) (02/21 0555) Weight change: 1.1 kg (2 lb 6.8 oz)  Intake/Output from previous day: 02/20 0701 - 02/21 0700 In: -  Out: 3100 [Urine:100] Intake/Output this shift: Total I/O In: 100 [I.V.:100] Out: 50 [Blood:50]  General appearance: alert and cooperative Resp: clear to auscultation bilaterally Chest wall: no tenderness Cardio: regular rate and rhythm, S1, S2 normal, no murmur, click, rub or gallop Extremities: bilat amp, Right BKA with post op wrap  Lab Results:  Recent Labs  04/10/13 0350 04/10/13 1217  WBC 11.7* 10.6*  HGB 9.5* 9.4*  HCT 29.8* 29.3*  PLT 274 280   BMET:  Recent Labs  04/09/13 1648 04/10/13 0350  NA 132* 135*  K 4.5 3.7  CL 88* 93*  CO2 27 28  GLUCOSE 220* 163*  BUN 53* 22  CREATININE 7.13* 4.13*  CALCIUM 8.5 8.9   No results found for this basename: PTH,  in the last 72 hours Iron Studies:  Recent Labs  04/09/13 1600  IRON 11*  TIBC 153*   Studies/Results: No results found.  Scheduled: . aspirin EC  81 mg Oral QPM  . atorvastatin  80 mg Oral QPM  . darbepoetin  40 mcg Intravenous Q Fri-HD  . [START ON 04/12/2013] doxercalciferol  1 mcg Intravenous Q M,W,F-HD  . feeding supplement (NEPRO CARB STEADY)  237 mL Oral Q24H  . heparin  5,000 Units Subcutaneous 3 times per day  . insulin aspart  0-15 Units Subcutaneous TID WC  . insulin aspart  0-5 Units  Subcutaneous QHS  . multivitamin  1 tablet Oral QHS  . piperacillin-tazobactam (ZOSYN)  IV  2.25 g Intravenous 3 times per day  . sevelamer carbonate  1,600 mg Oral BID WC  . vancomycin  750 mg Intravenous Q M,W,F-HD     LOS: 2 days   Alan [name] C 04/10/2013,1:33 PM

## 2013-04-10 NOTE — H&P (View-Only) (Signed)
Reason for Consult: Gangrene right forefoot Referring Physician: [name] is an 73 y.o. male.  HPI: Alan [name] is a 73 year old gentleman with peripheral vascular disease end-stage renal disease on dialysis who presents with gangrene of the right great toe as well as fourth toe and fifth toe right foot he is status post transtibial amputation of the left.  Past Medical History  Diagnosis Date  . Diabetes mellitus   . Hypertension   . Gout   . Coronary artery disease   . Stroke   . Asthma   . Renal insufficiency   . Hypercholesteremia   . Arthritis   . DDD (degenerative disc disease), lumbar   . DDD (degenerative disc disease), cervical   . Gangrene of toe Feb. 2015    Right  great      Past Surgical History  Procedure Laterality Date  . Coronary artery bypass graft    . Below knee leg amputation Left   . Esophagogastroduodenoscopy Left 05/24/2012    CWC:BJSEGBTD dilated baggy but otherwise a normal/ Small hiatal hernia. Gastric ulcer -S/P biopsy    Family History  Problem Relation Age of Onset  . Colon cancer Neg Hx     Social History:  reports that he has never smoked. He has never used smokeless tobacco. He reports that he does not drink alcohol or use illicit drugs.  Allergies:  Allergies  Allergen Reactions  . Codeine Itching and Nausea And Vomiting  . Yellow Jacket Venom [Bee Venom] Swelling    Medications: I have reviewed the Alan [name]'s current medications.  Results for orders placed during the hospital encounter of 04/08/13 (from the past 48 hour(s))  BASIC METABOLIC PANEL     Status: Abnormal   Collection Time    04/08/13  7:09 PM      Result Value Ref Range   Sodium 137  137 - 147 mEq/L   Potassium 4.3  3.7 - 5.3 mEq/L   Chloride 90 (*) 96 - 112 mEq/L   CO2 29  19 - 32 mEq/L   Glucose, Bld 159 (*) 70 - 99 mg/dL   BUN 40 (*) 6 - 23 mg/dL   Creatinine, Ser 5.51 (*) 0.50 - 1.35 mg/dL   Calcium 9.1  8.4 - 10.5 mg/dL   GFR calc non Af Amer 9  (*) >90 mL/min   GFR calc Af Amer 11 (*) >90 mL/min   Comment: (NOTE)     The eGFR has been calculated using the CKD EPI equation.     This calculation has not been validated in all clinical situations.     eGFR's persistently <90 mL/min signify possible Chronic Kidney     Disease.  CBC WITH DIFFERENTIAL     Status: Abnormal   Collection Time    04/08/13  7:09 PM      Result Value Ref Range   WBC 14.5 (*) 4.0 - 10.5 K/uL   RBC 3.62 (*) 4.22 - 5.81 MIL/uL   Hemoglobin 11.1 (*) 13.0 - 17.0 g/dL   HCT 33.4 (*) 39.0 - 52.0 %   MCV 92.3  78.0 - 100.0 fL   MCH 30.7  26.0 - 34.0 pg   MCHC 33.2  30.0 - 36.0 g/dL   RDW 14.0  11.5 - 15.5 %   Platelets 336  150 - 400 K/uL   Neutrophils Relative % 79 (*) 43 - 77 %   Neutro Abs 11.4 (*) 1.7 - 7.7 K/uL   Lymphocytes Relative 16  12 -  46 %   Lymphs Abs 2.3  0.7 - 4.0 K/uL   Monocytes Relative 5  3 - 12 %   Monocytes Absolute 0.7  0.1 - 1.0 K/uL   Eosinophils Relative 1  0 - 5 %   Eosinophils Absolute 0.1  0.0 - 0.7 K/uL   Basophils Relative 0  0 - 1 %   Basophils Absolute 0.1  0.0 - 0.1 K/uL  GLUCOSE, CAPILLARY     Status: Abnormal   Collection Time    04/08/13 10:10 PM      Result Value Ref Range   Glucose-Capillary 194 (*) 70 - 99 mg/dL  COMPREHENSIVE METABOLIC PANEL     Status: Abnormal   Collection Time    04/09/13  3:44 AM      Result Value Ref Range   Sodium 132 (*) 137 - 147 mEq/L   Potassium 4.2  3.7 - 5.3 mEq/L   Chloride 89 (*) 96 - 112 mEq/L   CO2 28  19 - 32 mEq/L   Glucose, Bld 199 (*) 70 - 99 mg/dL   BUN 43 (*) 6 - 23 mg/dL   Creatinine, Ser 6.35 (*) 0.50 - 1.35 mg/dL   Calcium 8.6  8.4 - 10.5 mg/dL   Total Protein 6.5  6.0 - 8.3 g/dL   Albumin 2.0 (*) 3.5 - 5.2 g/dL   AST 10  0 - 37 U/L   ALT 6  0 - 53 U/L   Alkaline Phosphatase 76  39 - 117 U/L   Total Bilirubin 0.3  0.3 - 1.2 mg/dL   GFR calc non Af Amer 8 (*) >90 mL/min   GFR calc Af Amer 9 (*) >90 mL/min   Comment: (NOTE)     The eGFR has been calculated  using the CKD EPI equation.     This calculation has not been validated in all clinical situations.     eGFR's persistently <90 mL/min signify possible Chronic Kidney     Disease.  CBC     Status: Abnormal   Collection Time    04/09/13  3:44 AM      Result Value Ref Range   WBC 10.9 (*) 4.0 - 10.5 K/uL   RBC 3.01 (*) 4.22 - 5.81 MIL/uL   Hemoglobin 8.8 (*) 13.0 - 17.0 g/dL   Comment: REPEATED TO VERIFY   HCT 27.9 (*) 39.0 - 52.0 %   MCV 92.7  78.0 - 100.0 fL   MCH 29.2  26.0 - 34.0 pg   MCHC 31.5  30.0 - 36.0 g/dL   RDW 14.3  11.5 - 15.5 %   Platelets 287  150 - 400 K/uL  PROTIME-INR     Status: None   Collection Time    04/09/13  3:44 AM      Result Value Ref Range   Prothrombin Time 14.5  11.6 - 15.2 seconds   INR 1.15  0.00 - 1.49    No results found.  Review of Systems  All other systems reviewed and are negative.   Blood pressure 110/58, pulse 78, temperature 99.6 F (37.6 C), temperature source Oral, resp. rate 18, height 5' 9"  (1.753 m), weight 81.6 kg (179 lb 14.3 oz), SpO2 90.00%. Physical Exam On examination Alan [name] has dry gangrene involving the right great toe right fourth toe and right fifth toe. There is no ulceration or cellulitis within the leg. Assessment/Plan: Assessment: Dry gangrene right great toe right fourth toe and right fifth toe.  Plan: Feel Alan [name] would  benefit from a transtibial amputation. Alan [name] Alan [name] undergo his dialysis today and anticipate placing Alan [name] was scheduled for Saturday morning. Risks and benefits were discussed with the Alan [name] and his family including nonhealing of the wound need for additional surgery. Alan [name] family state they understand and wish to proceed at this time.  Alan [name],Alan [name] 04/09/2013, 7:52 AM

## 2013-04-10 NOTE — Addendum Note (Signed)
Addendum created 04/10/13 1737 by Sheppard Plumber, CRNA   Modules edited: Anesthesia Blocks and Procedures, Anesthesia Medication Administration, Clinical Notes   Clinical Notes:  File: [id number]; File: [id number]

## 2013-04-10 NOTE — Preoperative (Signed)
Beta Blockers   Reason not to administer Beta Blockers:Not Applicable 

## 2013-04-11 LAB — BASIC METABOLIC PANEL
BUN: 39 mg/dL — ABNORMAL HIGH (ref 6–23)
CALCIUM: 8.5 mg/dL (ref 8.4–10.5)
CO2: 24 mEq/L (ref 19–32)
CREATININE: 6.33 mg/dL — AB (ref 0.50–1.35)
Chloride: 90 mEq/L — ABNORMAL LOW (ref 96–112)
GFR calc Af Amer: 9 mL/min — ABNORMAL LOW (ref >90)
GFR calc non Af Amer: 8 mL/min — ABNORMAL LOW (ref >90)
Glucose, Bld: 206 mg/dL — ABNORMAL HIGH (ref 70–99)
Potassium: 4.5 mEq/L (ref 3.7–5.3)
Sodium: 133 mEq/L — ABNORMAL LOW (ref 137–147)

## 2013-04-11 LAB — CBC
HEMATOCRIT: 28.3 % — AB (ref 39.0–52.0)
Hemoglobin: 8.9 g/dL — ABNORMAL LOW (ref 13.0–17.0)
MCH: 29.3 pg (ref 26.0–34.0)
MCHC: 31.4 g/dL (ref 30.0–36.0)
MCV: 93.1 fL (ref 78.0–100.0)
PLATELETS: 261 10*3/uL (ref 150–400)
RBC: 3.04 MIL/uL — AB (ref 4.22–5.81)
RDW: 13.9 % (ref 11.5–15.5)
WBC: 8.6 10*3/uL (ref 4.0–10.5)

## 2013-04-11 LAB — CULTURE, BLOOD (ROUTINE X 2)

## 2013-04-11 LAB — GLUCOSE, CAPILLARY
GLUCOSE-CAPILLARY: 138 mg/dL — AB (ref 70–99)
GLUCOSE-CAPILLARY: 150 mg/dL — AB (ref 70–99)
Glucose-Capillary: 151 mg/dL — ABNORMAL HIGH (ref 70–99)
Glucose-Capillary: 79 mg/dL (ref 70–99)

## 2013-04-11 MED ORDER — CIPROFLOXACIN IN D5W 400 MG/200ML IV SOLN
400.0000 mg | INTRAVENOUS | Status: DC
  Administered 2013-04-11: 400 mg via INTRAVENOUS
  Filled 2013-04-11 (×2): qty 200

## 2013-04-11 MED ORDER — INSULIN NPH (HUMAN) (ISOPHANE) 100 UNIT/ML ~~LOC~~ SUSP
10.0000 [IU] | Freq: Every day | SUBCUTANEOUS | Status: DC
  Administered 2013-04-11 – 2013-04-13 (×3): 10 [IU] via SUBCUTANEOUS
  Filled 2013-04-11 (×2): qty 10

## 2013-04-11 MED ORDER — PRO-STAT SUGAR FREE PO LIQD
30.0000 mL | Freq: Every day | ORAL | Status: DC
  Administered 2013-04-12 – 2013-04-14 (×3): 30 mL via ORAL
  Filled 2013-04-11 (×3): qty 30

## 2013-04-11 MED ORDER — FUROSEMIDE 20 MG PO TABS
20.0000 mg | ORAL_TABLET | Freq: Every evening | ORAL | Status: DC
  Administered 2013-04-11: 20 mg via ORAL
  Filled 2013-04-11 (×2): qty 1

## 2013-04-11 NOTE — Progress Notes (Signed)
[facility] PROGRESS NOTE  Alan [name] [id number] DOB: 1940/05/11 DOA: 04/08/2013 PCP: No PCP Per Patient  Brief narrative: Alan [name] is a 73yo man with PMH of DM2, CAD, osteomyelitis, ESRD on HD, HTN s/p left BKA who presents for non healing foot wound on the right large toe and likely osteomyelitis.   Assessment/Plan  Principal Problem:  Diabetic osteomyelitis: - s/p R BKA by Dr Sharol Given. POD #1  - continuing on vanc/zosyn per Dr Sharol Given    - percocet for mild and dilaudid for severe pain prn   Active Problems: ESRD on dialysis  - Consult to nephrology . Appreciate their recs  - iHD MWF  - No specific metabolic derangements noted at this time.   CAD (coronary artery disease) - ASA continued along with statin and prn nitroglycerin  DM type 2 causing vascular disease - resuming home NPH   10 units with a sliding scalle - Monitor CBGs.   HTN (hypertension), benign - BP has ranged from [id number] systolic to [id number] since being here - resumed home meds  - Re-eval after dialysis   S/P BKA (below knee amputation) unilateral, left - No acute issue  HLD - Continue statin  DVT PPx: Heparin SQ  Consultants:  Vascular Surgery  nephrology   Procedures/Studies: No results found.  MRI 03/23/13 IMPRESSION:  1. Marrow edema within the first proximal phalanx and first distal  phalanx without definite bone destruction. This may represent  reactive marrow edema versus osteomyelitis.  2. There is soft tissue edema along the dorsal aspect of the midfoot  and forefoot likely reflecting cellulitis. There is a metallic  foreign body in the web space between the first and second digits.  3 Small joint effusion of the first MTP joint which may be reactive  although infection cannot be excluded. There are no marrow changes  in the first metatarsal head which would argue against infection.   Antibiotics:  Vancomycin  Zosyn  Code Status: Full Family Communication: Pt at  bedside Disposition Plan: Pending surgery, likely Alan [name] need SNF placement.   HPI/Subjective:  Resting comfortably   Objective: Filed Vitals:   04/10/13 1348 04/10/13 1607 04/10/13 2228 04/11/13 0532  BP: 94/47 105/56 125/55 135/52  Pulse: 58 61 66 69  Temp: 97.7 F (36.5 C) 97.8 F (36.6 C) 97.8 F (36.6 C) 98.1 F (36.7 C)  TempSrc:   Oral Oral  Resp: 18 18 18 18   Height:      Weight:   79 kg (174 lb 2.6 oz)   SpO2: 95% 95% 96% 96%    Intake/Output Summary (Last 24 hours) at 04/11/13 [id number] Last data filed at 04/10/13 1900  Gross per 24 hour  Intake    600 ml  Output      0 ml  Net    600 ml    Exam:   General:  Pt is alert, follows commands appropriately, not in acute distress  Cardiovascular: RR, NR, no murmur noted  Respiratory: Clear to auscultation bilaterally, no wheezing,  Abdomen: Soft, non tender, non distended, bowel sounds present  Extremities: RLE s/p BKA w/ bandage in place, LLE is s/p BKA stump is C/D/I.  Right great toe with dry gangrene with surrounding warmth/redness.  No drainage.   Neuro: Grossly nonfocal  Data Reviewed: Basic Metabolic Panel:  Recent Labs Lab 04/08/13 1909 04/09/13 0344 04/09/13 1648 04/10/13 0350  NA 137 132* 132* 135*  K 4.3 4.2 4.5 3.7  CL 90* 89* 88* 93*  CO2 29  28 27 28   GLUCOSE 159* 199* 220* 163*  BUN 40* 43* 53* 22  CREATININE 5.51* 6.35* 7.13* 4.13*  CALCIUM 9.1 8.6 8.5 8.9  PHOS  --   --  3.4  --    Liver Function Tests:  Recent Labs Lab 04/09/13 0344 04/09/13 1648  AST 10  --   ALT 6  --   ALKPHOS 76  --   BILITOT 0.3  --   PROT 6.5  --   ALBUMIN 2.0* 2.0*   No results found for this basename: LIPASE, AMYLASE,  in the last 168 hours No results found for this basename: AMMONIA,  in the last 168 hours CBC:  Recent Labs Lab 04/08/13 1909 04/09/13 0344 04/10/13 0350 04/10/13 1217  WBC 14.5* 10.9* 11.7* 10.6*  NEUTROABS 11.4*  --   --   --   HGB 11.1* 8.8* 9.5* 9.4*  HCT 33.4* 27.9*  29.8* 29.3*  MCV 92.3 92.7 92.5 93.0  PLT 336 287 274 280   Cardiac Enzymes: No results found for this basename: CKTOTAL, CKMB, CKMBINDEX, TROPONINI,  in the last 168 hours BNP: No components found with this basename: POCBNP,  CBG:  Recent Labs Lab 04/10/13 1008 04/10/13 1131 04/10/13 1604 04/10/13 2225 04/11/13 0806  GLUCAP 150* 176* 146* 206* 151*    Recent Results (from the past 240 hour(s))  CULTURE, BLOOD (ROUTINE X 2)     Status: None   Collection Time    04/08/13  9:03 PM      Result Value Ref Range Status   Specimen Description BLOOD FOREARM RIGHT   Final   Special Requests BOTTLES DRAWN AEROBIC AND ANAEROBIC 10CC   Final   Culture  Setup Time     Final   Value: 04/09/2013 04:16     Performed at [facility]   Culture     Final   Value: SERRATIA MARCESCENS     Note: Gram Stain Report Called to,Read Back By and Verified With: SUSIE BRENNAN 04/09/13 1525 BY SMITHERSJ Gram Stain Report Called to,Read Back By and Verified With: REBECCA QIU 04/09/13 AT 8:25PM [place]     Performed at [facility]   Report Status 04/11/2013 FINAL   Final   Organism ID, Bacteria SERRATIA MARCESCENS   Final  CULTURE, BLOOD (ROUTINE X 2)     Status: None   Collection Time    04/08/13  9:12 PM      Result Value Ref Range Status   Specimen Description BLOOD HAND RIGHT   Final   Special Requests BOTTLES DRAWN AEROBIC AND ANAEROBIC 10CC   Final   Culture  Setup Time     Final   Value: 04/09/2013 04:16     Performed at [facility]   Culture     Final   Value: SERRATIA MARCESCENS     Note: SUSCEPTIBILITIES PERFORMED ON PREVIOUS CULTURE WITHIN THE LAST 5 DAYS.     Note: Gram Stain Report Called to,Read Back By and Verified With: REBECCA QIU 04/09/13 AT 8:25PM [place]     Performed at [facility]   Report Status 04/11/2013 FINAL   Final     Scheduled Meds: . aspirin EC  81 mg Oral QPM  . atorvastatin  80 mg Oral QPM  . darbepoetin  40 mcg Intravenous Q Fri-HD   . [START ON 04/12/2013] doxercalciferol  1 mcg Intravenous Q M,W,F-HD  . feeding supplement (NEPRO CARB STEADY)  237 mL Oral Q24H  . furosemide  20  mg Oral QPM  . heparin  5,000 Units Subcutaneous 3 times per day  . insulin aspart  0-15 Units Subcutaneous TID WC  . insulin aspart  0-5 Units Subcutaneous QHS  . insulin NPH Human  10 Units Subcutaneous QHS  . multivitamin  1 tablet Oral QHS  . piperacillin-tazobactam (ZOSYN)  IV  2.25 g Intravenous 3 times per day  . sevelamer carbonate  1,600 mg Oral BID WC  . vancomycin  750 mg Intravenous Q M,W,F-HD   Continuous Infusions: . sodium chloride 20 mL/hr at 04/10/13 0706  . sodium chloride 20 mL/hr (04/10/13 0930)     Velna Hatchet, MD  [facility] Pager [phone]  If 7PM-7AM, please contact night-coverage [email or web address] Password [facility] 04/11/2013, 9:28 AM   LOS: 3 days

## 2013-04-11 NOTE — Progress Notes (Signed)
Patient ID: Alan [name], male   DOB: Jul 08, 1940, 73 y.o.   MRN: [id number] No acute changes.  Right BKA stump dressing clean and intact.  Constantine need SNF placement.

## 2013-04-11 NOTE — Progress Notes (Signed)
Agree with note as articulated by Ms. Cletus Gash.  Flemon change antibiotics to IV Cipro with S. Marcescens bacteremia.  Emmons advance diet. Myson Levi C

## 2013-04-11 NOTE — Progress Notes (Signed)
[facility] Progress Note  Subjective:   Post op pain. Prev c/o of dysuria but has not voided today. BC's positive for S. Marcescens. Afebrile  Objective Filed Vitals:   04/10/13 1607 04/10/13 2228 04/11/13 0532 04/11/13 0935  BP: 105/56 125/55 135/52 120/48  Pulse: 61 66 69 65  Temp: 97.8 F (36.6 C) 97.8 F (36.6 C) 98.1 F (36.7 C) 98.2 F (36.8 C)  TempSrc:  Oral Oral   Resp: 18 18 18 18   Height:      Weight:  79 kg (174 lb 2.6 oz)    SpO2: 95% 96% 96% 96%   Physical Exam General: Alert, cooperative, NAD Heart: RRR, no murmur/rub Lungs: CTA bilat, no wheezes/ Rhonchi Abdomen: soft, NT, + BS Extremities: R BKA stump with clean/dry dressings, L BKA without ulcerations Dialysis Access: L AVF + bruit  Dialysis Orders: MWF/ [facility] EDW 83 kg. 2K/2.5Ca Bath. Heparin 1000 bolus/ 500 hourly. L AVF 400/A1.5 Epo 5000 u. Venofer 50 mg IV weekly, Hectorol 1 (holding this admit for hypercalcemia) Recent Labs: Hgb 10.4, Phos 4.4. Alb 2.9, PTH 439   Assessment/Plan: 1. Diabetic osteomyelitis - R BKA per Dr Sharol Given on 2/21. On Vanc/Zosyn. 2. Serratia marcescens bacteremia - per BC's on 2/19.  Sensitive to multiple agents. Needs coordination of abx therapy for #'s 1,2 and 3.  3. Dysuria - UA with protein and small # of RBCs.  4. ESRD - MWF, K+ 3.7, HD tomorrow  5. Anemia - Hgb 8.9 post op. Aranesp 40 q Fri. Tsat 7%. Fe load x 10 doses. 6. Secondary hyperparathyroidism - Ca 8.9 (10.5 corrected). Use low Ca bath. Hold Vit D. Continue renvela. Follow labs 7. HTN/volume - SBPs 120s-130s on home Lasix. S/p BKA, needs lower edw (79-79.5 kg?)  8. Malnutrition - Alb 2.0. On liquids, multivitamin, ONS, add prostat 9. DM - Insulin per primary  Collene Leyden. Rhodia Albright [facility] Pager [phone] 04/11/2013,11:00 AM  LOS: 3 days    Additional Objective Labs: Basic Metabolic Panel:  Recent Labs Lab 04/09/13 0344 04/09/13 1648 04/10/13 0350  NA  132* 132* 135*  K 4.2 4.5 3.7  CL 89* 88* 93*  CO2 28 27 28   GLUCOSE 199* 220* 163*  BUN 43* 53* 22  CREATININE 6.35* 7.13* 4.13*  CALCIUM 8.6 8.5 8.9  PHOS  --  3.4  --    Liver Function Tests:  Recent Labs Lab 04/09/13 0344 04/09/13 1648  AST 10  --   ALT 6  --   ALKPHOS 76  --   BILITOT 0.3  --   PROT 6.5  --   ALBUMIN 2.0* 2.0*   No results found for this basename: LIPASE, AMYLASE,  in the last 168 hours CBC:  Recent Labs Lab 04/08/13 1909 04/09/13 0344 04/10/13 0350 04/10/13 1217 04/11/13 0500  WBC 14.5* 10.9* 11.7* 10.6* 8.6  NEUTROABS 11.4*  --   --   --   --   HGB 11.1* 8.8* 9.5* 9.4* 8.9*  HCT 33.4* 27.9* 29.8* 29.3* 28.3*  MCV 92.3 92.7 92.5 93.0 93.1  PLT 336 287 274 280 261   Blood Culture    Component Value Date/Time   SDES BLOOD HAND RIGHT 04/08/2013 2112   SPECREQUEST BOTTLES DRAWN AEROBIC AND ANAEROBIC 10CC 04/08/2013 2112   CULT  Value: SERRATIA MARCESCENS Note: SUSCEPTIBILITIES PERFORMED ON PREVIOUS CULTURE WITHIN THE LAST 5 DAYS. Note: Gram Stain Report Called to,Read Back By and Verified With: REBECCA QIU 04/09/13 AT 8:25PM [facility] Performed  at [facility] 04/08/2013 2112   REPTSTATUS 04/11/2013 FINAL 04/08/2013 2112    CBG:  Recent Labs Lab 04/10/13 1008 04/10/13 1131 04/10/13 1604 04/10/13 2225 04/11/13 0806  GLUCAP 150* 176* 146* 206* 151*   Iron Studies:  Recent Labs  04/09/13 1600  IRON 11*  TIBC 153*   Studies/Results: No results found. Medications: . sodium chloride 20 mL/hr at 04/10/13 0706  . sodium chloride 20 mL/hr (04/10/13 0930)   . aspirin EC  81 mg Oral QPM  . atorvastatin  80 mg Oral QPM  . darbepoetin  40 mcg Intravenous Q Fri-HD  . [START ON 04/12/2013] doxercalciferol  1 mcg Intravenous Q M,W,F-HD  . feeding supplement (NEPRO CARB STEADY)  237 mL Oral Q24H  . furosemide  20 mg Oral QPM  . heparin  5,000 Units Subcutaneous 3 times per day  . insulin aspart  0-15 Units Subcutaneous TID WC  . insulin  aspart  0-5 Units Subcutaneous QHS  . insulin NPH Human  10 Units Subcutaneous QHS  . multivitamin  1 tablet Oral QHS  . piperacillin-tazobactam (ZOSYN)  IV  2.25 g Intravenous 3 times per day  . sevelamer carbonate  1,600 mg Oral BID WC  . vancomycin  750 mg Intravenous Q M,W,F-HD

## 2013-04-12 ENCOUNTER — Encounter (HOSPITAL_COMMUNITY): Payer: Self-pay | Admitting: Orthopedic Surgery

## 2013-04-12 DIAGNOSIS — R7881 Bacteremia: Secondary | ICD-10-CM

## 2013-04-12 DIAGNOSIS — B9689 Other specified bacterial agents as the cause of diseases classified elsewhere: Secondary | ICD-10-CM

## 2013-04-12 LAB — BASIC METABOLIC PANEL
BUN: 46 mg/dL — ABNORMAL HIGH (ref 6–23)
CHLORIDE: 93 meq/L — AB (ref 96–112)
CO2: 25 meq/L (ref 19–32)
CREATININE: 7.79 mg/dL — AB (ref 0.50–1.35)
Calcium: 8.8 mg/dL (ref 8.4–10.5)
GFR calc non Af Amer: 6 mL/min — ABNORMAL LOW (ref >90)
GFR, EST AFRICAN AMERICAN: 7 mL/min — AB (ref >90)
Glucose, Bld: 110 mg/dL — ABNORMAL HIGH (ref 70–99)
POTASSIUM: 4.9 meq/L (ref 3.7–5.3)
Sodium: 135 mEq/L — ABNORMAL LOW (ref 137–147)

## 2013-04-12 LAB — CBC
HCT: 27.6 % — ABNORMAL LOW (ref 39.0–52.0)
Hemoglobin: 8.8 g/dL — ABNORMAL LOW (ref 13.0–17.0)
MCH: 29.3 pg (ref 26.0–34.0)
MCHC: 31.9 g/dL (ref 30.0–36.0)
MCV: 92 fL (ref 78.0–100.0)
PLATELETS: 279 10*3/uL (ref 150–400)
RBC: 3 MIL/uL — AB (ref 4.22–5.81)
RDW: 13.9 % (ref 11.5–15.5)
WBC: 8.1 10*3/uL (ref 4.0–10.5)

## 2013-04-12 LAB — GLUCOSE, CAPILLARY
GLUCOSE-CAPILLARY: 93 mg/dL (ref 70–99)
GLUCOSE-CAPILLARY: 140 mg/dL — AB (ref 70–99)
Glucose-Capillary: 196 mg/dL — ABNORMAL HIGH (ref 70–99)

## 2013-04-12 MED ORDER — CEFTAZIDIME 2 G IJ SOLR
2.0000 g | INTRAMUSCULAR | Status: DC
  Administered 2013-04-12 – 2013-04-14 (×2): 2 g via INTRAVENOUS
  Filled 2013-04-12 (×3): qty 2

## 2013-04-12 NOTE — Progress Notes (Signed)
Patient ID: Alan [name], male   DOB: 11-14-1940, 73 y.o.   MRN: [id number] Patient status post right transtibial amputation as well as previous left transtibial amputation. Patient is comfortable this morning in dialysis. Anticipate discharge to skilled nursing.

## 2013-04-12 NOTE — Progress Notes (Signed)
[facility] Progress Note   Subjective: On HD, no complaints, doing well  Filed Vitals:   04/12/13 0800 04/12/13 0830 04/12/13 0900 04/12/13 1000  BP: 131/60 117/56 111/58 121/61  Pulse: 67 68 68 79  Temp:      TempSrc:      Resp: 18 17 17 18   Height:      Weight:      SpO2:      Exam Alert, no distress No jvd Clear on R, harsh rales L base RRR no MRG Abd soft, nt, nd Bilat amp, no edema Neuro is nf, responds appropriately L forearm AVF patent  Dialysis: MWF [facility] 4h   83kg    2K Bath  Heparin 1000 bolus then 500/hr   L AVF Hectorol 1     Epo 5500     Venofer 50/wk  UA 100 prot, 0-2 rbc and wbc, no bact  Assessment: 1 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 2 Serratia bacteremia, on [place] w HD 3 ESRD on HD, recent start 4 Anemia, on darbe 40/wk 5 2HPTH, holding vit D, low Ca bath 6 HTN/volume- at lower dry wt after amp already, no vol excess 7 DM on insulin   Plan- HD today, stop po lasix, cont abx w HD, SNF placement pending, PT /OT to see pt    Kelly Splinter MD  pager [phone]    cell [phone]  04/12/2013, 10:18 AM     Recent Labs Lab 04/09/13 1648 04/10/13 0350 04/11/13 0500 04/12/13 0609  NA 132* 135* 133* 135*  K 4.5 3.7 4.5 4.9  CL 88* 93* 90* 93*  CO2 27 28 24 25   GLUCOSE 220* 163* 206* 110*  BUN 53* 22 39* 46*  CREATININE 7.13* 4.13* 6.33* 7.79*  CALCIUM 8.5 8.9 8.5 8.8  PHOS 3.4  --   --   --     Recent Labs Lab 04/09/13 0344 04/09/13 1648  AST 10  --   ALT 6  --   ALKPHOS 76  --   BILITOT 0.3  --   PROT 6.5  --   ALBUMIN 2.0* 2.0*    Recent Labs Lab 04/08/13 1909  04/10/13 1217 04/11/13 0500 04/12/13 0609  WBC 14.5*  < > 10.6* 8.6 8.1  NEUTROABS 11.4*  --   --   --   --   HGB 11.1*  < > 9.4* 8.9* 8.8*  HCT 33.4*  < > 29.3* 28.3* 27.6*  MCV 92.3  < > 93.0 93.1 92.0  PLT 336  < > 280 261 279  < > = values in this interval not displayed. Marland Kitchen aspirin EC  81 mg Oral QPM  . atorvastatin  80 mg  Oral QPM  . cefTAZidime (FORTAZ)  IV  2 g Intravenous Q M,W,F-HD  . darbepoetin  40 mcg Intravenous Q Fri-HD  . feeding supplement (PRO-STAT SUGAR FREE 64)  30 mL Oral Q1200  . furosemide  20 mg Oral QPM  . heparin  5,000 Units Subcutaneous 3 times per day  . insulin aspart  0-15 Units Subcutaneous TID WC  . insulin aspart  0-5 Units Subcutaneous QHS  . insulin NPH Human  10 Units Subcutaneous QHS  . multivitamin  1 tablet Oral QHS  . sevelamer carbonate  1,600 mg Oral BID WC   . sodium chloride 20 mL/hr at 04/10/13 0706  . sodium chloride 20 mL/hr (04/10/13 0930)   sodium chloride, sodium chloride, acetaminophen, acetaminophen, feeding supplement (NEPRO CARB STEADY),  fluticasone, heparin, HYDROmorphone (DILAUDID) injection, lidocaine (PF), lidocaine-prilocaine, metoCLOPramide (REGLAN) injection, metoCLOPramide, nitroGLYCERIN, ondansetron (ZOFRAN) IV, ondansetron, oxyCODONE-acetaminophen, pentafluoroprop-tetrafluoroeth, sucralfate

## 2013-04-12 NOTE — Progress Notes (Signed)
[facility] PROGRESS NOTE  Alan [name] [id number] DOB: 08-30-1940 DOA: 04/08/2013 PCP: No PCP Per Patient  Assessment/Plan: Sepsis -Present at the time of admission -Secondary to bacteremia and diabetic foot infection -Patient's antibiotics have been the escalated Diabetic foot infection/osteomyelitis of the right foot -Status post right BKA 04/10/2013 -Appreciate orthopedic followup -Discontinue vancomycin -Discontinue Cipro -Continue wound care -MRI 03/23/2013 verified marrow edema of the right foot Bacteremia -Due to the patient's ESRD status, Cipro Kimsey be discontinued -For convenience, the patient [place] be started on ceftazidime 2 g with each dialysis -Surveillance blood cultures given the fact that the patient has a left upper graft  -currently afebrile and hemodynamically stable -WBC improved ESRD  -Appreciate nephrology followup  -Hemodialysis Monday, Wednesday, Friday  CAD  -Continue aspirin  -Clinically stable  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 No intravenous contrast was administered.  COMPARISON:  DG TOE GREAT*R* dated 01/26/2013  FINDINGS: There is marrow edema within the first proximal and distal phalanx without definite bone destruction. There is a metallic foreign body with susceptibility artifact in the web space of the first and second digits.  There is a small joint effusion of the first MTP joint. There is soft tissue edema along the dorsal aspect of the midfoot forefoot. There is no focal fluid collection to suggest a drainable abscess.  The marrow signal is otherwise normal throughout the right foot.  IMPRESSION: 1. Marrow edema within the first proximal phalanx and first distal phalanx without definite bone destruction. This may represent reactive marrow edema versus osteomyelitis.  2. There is soft tissue edema along the dorsal aspect of the midfoot and forefoot likely reflecting cellulitis. There is a metallic foreign body in the web space between the first and second digits.  3 Small joint effusion of the first MTP joint which may be reactive although infection cannot be excluded. There are no marrow changes in the first metatarsal head which would argue against infection.   Electronically Signed   By: Kathreen Devoid   On: 03/23/2013 15:34         Subjective:  patient is doing well. Denies fevers, chills, chest discomfort, shortness breath, nausea, vomiting, diarrhea. No abdominal pain. No headache or dizziness the  Objective: Filed Vitals:   04/12/13 0730 04/12/13 0800 04/12/13 0830 04/12/13 0900  BP: 112/60 131/60 117/56 111/58  Pulse: 66 67 68 68  Temp:      TempSrc:      Resp: 18 18 17 17   Height:      Weight:      SpO2:        Intake/Output Summary (Last 24 hours) at 04/12/13 1008 Last data filed at 04/12/13 0601  Gross per 24 hour  Intake    440 ml  Output    325 ml  Net    115 ml   Weight change: 1.3 kg (2 lb 13.9 oz) Exam:   General:  Pt is alert, follows commands appropriately, not in acute distress  HEENT: No icterus, No thrush,  [place]/AT  Cardiovascular: RRR, S1/S2, no rubs, no gallops  Respiratory: CTA bilaterally, no wheezing, no crackles, no rhonchi  Abdomen: Soft/+BS, non tender, non distended, no guarding  Extremities: No edema, No lymphangitis, No petechiae, No rashes, no synovitis; right BKA  site wrapped in dressing without obvious drainage. No crepitance.   Data Reviewed: Basic Metabolic Panel:  Recent Labs Lab 04/09/13 0344 04/09/13 1648 04/10/13 0350 04/11/13 0500 04/12/13 0609  NA 132* 132* 135* 133* 135*  K 4.2 4.5 3.7 4.5 4.9  CL 89* 88* 93* 90* 93*  CO2 28 27 28 24 25   GLUCOSE 199* 220* 163* 206* 110*  BUN 43* 53* 22 39* 46*  CREATININE 6.35* 7.13* 4.13* 6.33* 7.79*  CALCIUM 8.6 8.5 8.9 8.5 8.8  PHOS  --  3.4  --   --   --    Liver Function Tests:  Recent Labs Lab 04/09/13 0344 04/09/13 1648  AST 10  --   ALT 6  --   ALKPHOS 76  --   BILITOT 0.3  --   PROT 6.5  --   ALBUMIN 2.0* 2.0*   No results found for this basename: LIPASE, AMYLASE,  in the last 168 hours No results found for this basename: AMMONIA,  in the last 168 hours CBC:  Recent Labs Lab 04/08/13 1909 04/09/13 0344 04/10/13 0350 04/10/13 1217 04/11/13 0500 04/12/13 0609  WBC 14.5* 10.9* 11.7* 10.6* 8.6 8.1  NEUTROABS 11.4*  --   --   --   --   --   HGB 11.1* 8.8* 9.5* 9.4* 8.9* 8.8*  HCT 33.4* 27.9* 29.8* 29.3* 28.3* 27.6*  MCV 92.3 92.7 92.5 93.0 93.1 92.0  PLT 336 287 274 280 261 279   Cardiac Enzymes: No results found for this basename: CKTOTAL, CKMB, CKMBINDEX, TROPONINI,  in the last 168 hours BNP: No components found with this basename: POCBNP,  CBG:  Recent Labs Lab 04/10/13 2225 04/11/13 0806 04/11/13 1107 04/11/13 1613 04/11/13 2115  GLUCAP 206* 151* 138* 79 150*    Recent Results (from the past 240 hour(s))  CULTURE, BLOOD (ROUTINE X 2)     Status: None   Collection Time    04/08/13  9:03 PM      Result Value Ref Range Status   Specimen Description BLOOD FOREARM RIGHT   Final   Special Requests BOTTLES DRAWN AEROBIC AND ANAEROBIC 10CC   Final   Culture  Setup Time     Final   Value: 04/09/2013 04:16     Performed at [facility]   Culture     Final   Value: SERRATIA MARCESCENS     Note: Gram Stain Report Called to,Read Back By and  Verified With: Lorain Childes 04/09/13 1525 BY SMITHERSJ Gram Stain Report Called to,Read Back By and Verified With: REBECCA QIU 04/09/13 AT 8:25PM [place]     Performed at [facility]   Report Status 04/11/2013 FINAL   Final   Organism ID, Bacteria SERRATIA MARCESCENS   Final  CULTURE, BLOOD (ROUTINE X 2)     Status: None   Collection Time    04/08/13  9:12 PM      Result Value Ref Range Status   Specimen Description BLOOD HAND RIGHT   Final   Special Requests BOTTLES DRAWN AEROBIC AND ANAEROBIC 10CC   Final   Culture  Setup Time     Final  Value: 04/09/2013 04:16     Performed at [facility]   Culture     Final   Value: SERRATIA MARCESCENS     Note: SUSCEPTIBILITIES PERFORMED ON PREVIOUS CULTURE WITHIN THE LAST 5 DAYS.     Note: Gram Stain Report Called to,Read Back By and Verified With: REBECCA QIU 04/09/13 AT 8:25PM [place]     Performed at [facility]   Report Status 04/11/2013 FINAL   Final     Scheduled Meds: . aspirin EC  81 mg Oral QPM  . atorvastatin  80 mg Oral QPM  . ciprofloxacin  400 mg Intravenous Q24H  . darbepoetin  40 mcg Intravenous Q Fri-HD  . feeding supplement (PRO-STAT SUGAR FREE 64)  30 mL Oral Q1200  . furosemide  20 mg Oral QPM  . heparin  5,000 Units Subcutaneous 3 times per day  . insulin aspart  0-15 Units Subcutaneous TID WC  . insulin aspart  0-5 Units Subcutaneous QHS  . insulin NPH Human  10 Units Subcutaneous QHS  . multivitamin  1 tablet Oral QHS  . sevelamer carbonate  1,600 mg Oral BID WC   Continuous Infusions: . sodium chloride 20 mL/hr at 04/10/13 0706  . sodium chloride 20 mL/hr (04/10/13 0930)     Taydem Cavagnaro, DO  [facility] Pager [phone]  If 7PM-7AM, please contact night-coverage [email or web address] Password [email or web address] 04/12/2013, 10:08 AM   LOS: 4 days

## 2013-04-12 NOTE — Procedures (Signed)
I was present at this dialysis session, have reviewed the session itself and made  appropriate changes  Kelly Splinter MD (pgr) [phone]    [phone] 04/12/2013, 10:10 AM

## 2013-04-12 NOTE — Progress Notes (Signed)
Physical Therapy Treatment Patient Details Name: Alan [name] MRN: [id number] DOB: 1940/11/13 Today's Date: 04/12/2013 Time: 1210-1225 PT Time Calculation (min): 15 min  PT Assessment / Plan / Recommendation  History of Present Illness Alan [name] is a 73yo man with PMH of DM2, CAD, osteomyelitis, ESRD on HD, HTN s/p left BKA who presents for non healing foot wound on the right large toe and likely osteomyelitis now s/p R BKA   PT Comments   Patient continues to mobilize well despite new amputation.  Noted MD continues to recommend SNF, but feel patient can manage well at home with HHPT, wife assist and HHRN.  Follow Up Recommendations  Home health PT;Supervision for mobility/OOB     Does the patient have the potential to tolerate intense rehabilitation   N/A  Barriers to Discharge  None      Equipment Recommendations  None recommended by PT    Recommendations for Other Services  None  Frequency Min 3X/week   Progress towards PT Goals Progress towards PT goals: Progressing toward goals  Plan Current plan remains appropriate    Precautions / Restrictions Precautions Precautions: Fall Precaution Comments: bil BKA   Pertinent Vitals/Pain 2/10 new right BKA    Mobility  Bed Mobility General bed mobility comments: pt sitting on edge of bed eating lunch Transfers Overall transfer level: Needs assistance Transfers: Lateral/Scoot Transfers  Lateral/Scoot Transfers: Min assist General transfer comment: scooting into wheelchair from edge of bed to left with assist due to loss of balance as transitioning from bed to chair left knee bent and leg caught momentarily under him; wheelchair to recliner with assist for initiating over hump of armrest of recliner Wheelchair Mobility Wheelchair mobility: Yes Wheelchair propulsion: Both upper extremities Wheelchair parts: Needs assistance Distance: 150' Wheelchair Assistance Details (indicate cue type and reason): assist to set up  wheelchair for transfer to chair, but sets up for wheelchair to recliner independent      PT Goals (current goals can now be found in the care plan section)    Visit Information  Last PT Received On: 04/12/13 Assistance Needed: +1 History of Present Illness: Alan [name] is a 73yo man with PMH of DM2, CAD, osteomyelitis, ESRD on HD, HTN s/p left BKA who presents for non healing foot wound on the right large toe and likely osteomyelitis now s/p R BKA    Subjective Data      Cognition  Cognition Arousal/Alertness: Awake/alert Behavior During Therapy: WFL for tasks assessed/performed Overall Cognitive Status: Within Functional Limits for tasks assessed    Balance  Balance Overall balance assessment: Needs assistance Sitting-balance support: Feet unsupported Sitting balance-Leahy Scale: Good Sitting balance - Comments: static balance good, but needs assist for safety with transfers (dynamic sitting balance)  End of Session PT - End of Session Equipment Utilized During Treatment: Gait belt Activity Tolerance: Patient tolerated treatment well Patient left: in chair;with call bell/phone within reach   GP     [facility] 04/12/2013, 1:25 PM [place], [place] 04/12/2013

## 2013-04-12 NOTE — Evaluation (Signed)
Occupational Therapy Evaluation Patient Details Name: Alan [name] MRN: [id number] DOB: 04/01/1940 Today's Date: 04/12/2013 Time: [id number] OT Time Calculation (min): 15 min  OT Assessment / Plan / Recommendation History of present illness Alan [name] is a 73yo man with PMH of DM2, CAD, osteomyelitis, ESRD on HD, HTN s/p left BKA who presents for non healing foot wound on the right large toe and likely osteomyelitis now s/p R BKA   Clinical Impression   Pt demos decline in function with ADLs and ADL mobility safety and would benefit from acute OT services to address impairments to increase level of function and safety to return home. MD notes that pt may need SNF, however, feel that pt would do well at home with [facility] and 24 hr sup/assist form spouse    OT Assessment  Patient needs continued OT Services    Follow Up Recommendations  Home health OT;Supervision/Assistance - 24 hour    Barriers to Discharge   none  Equipment Recommendations  Tub/shower bench;Other (comment) Management consultant)    Recommendations for Other Services    Frequency  Min 2X/week    Precautions / Restrictions Precautions Precautions: Fall Precaution Comments: bil BKA Restrictions Weight Bearing Restrictions: Yes RLE Weight Bearing: Non weight bearing   Pertinent Vitals/Pain 6/10 R LE    ADL  Grooming: Performed;Wash/dry hands;Wash/dry face;Set up Where Assessed - Grooming: Unsupported sitting Upper Body Bathing: Simulated;Set up Where Assessed - Upper Body Bathing: Unsupported sitting Lower Body Bathing: Simulated;Minimal assistance Where Assessed - Lower Body Bathing: Unsupported sitting;Lean right and/or left Upper Body Dressing: Performed;Set up Where Assessed - Upper Body Dressing: Unsupported sitting Lower Body Dressing: Performed;Minimal assistance Where Assessed - Lower Body Dressing: Unsupported sitting;Lean right and/or left Toilet Transfer: Simulated;Minimal assistance Toilet Transfer Method:  Other (comment) (lateral  scooting) Toileting - Clothing Manipulation and Hygiene: Performed;Min guard Where Assessed - Camera operator Manipulation and Hygiene: Lean right and/or left Tub/Shower Transfer Method: Not assessed Equipment Used: Wheelchair Transfers/Ambulation Related to ADLs: scooting into wheelchair from edge of bed and back to bed    OT Diagnosis: Acute pain  OT Problem List: Decreased knowledge of use of DME or AE;Impaired balance (sitting and/or standing) OT Treatment Interventions: Self-care/ADL training;Balance training;Therapeutic activities;DME and/or AE instruction;Patient/family education;Neuromuscular education   OT Goals(Current goals can be found in the care plan section) Acute Rehab OT Goals Patient Stated Goal: return home OT Goal Formulation: With patient/family Time For Goal Achievement: 04/19/13 Potential to Achieve Goals: Good ADL Goals Pt Erby Perform Lower Body Bathing: with min guard assist;with supervision;with set-up;sitting/lateral leans Pt Marin Perform Lower Body Dressing: with min guard assist;with supervision;with set-up;sitting/lateral leans Pt Diyari Transfer to Toilet: with min guard assist;with supervision;bedside commode;regular height toilet;grab bars Pt Aran Perform Toileting - Clothing Manipulation and hygiene: with supervision;sitting/lateral leans Pt Jawan Perform Tub/Shower Transfer: with min assist;with min guard assist;tub bench  Visit Information  Last OT Received On: 04/12/13 Assistance Needed: +1 History of Present Illness: Alan [name] is a 73yo man with PMH of DM2, CAD, osteomyelitis, ESRD on HD, HTN s/p left BKA who presents for non healing foot wound on the right large toe and likely osteomyelitis now s/p R BKA       Prior Functioning     Home Living Family/patient expects to be discharged to:: Private residence Living Arrangements: Spouse/significant other;Children Available Help at Discharge: Family;Available 24  hours/day Type of Home: House Home Access: Level entry;Ramped entrance Home Layout: One level Home Equipment: Walker - 2 wheels;Bedside commode;Wheelchair - Education officer, community - power;Other (  comment);Grab bars - toilet (has sliding board) Prior Function Level of Independence: Needs assistance Gait / Transfers Assistance Needed: non ambulatory x 5 mo due to pain in RLE. Transfers to [facility] on his own ADL's / Homemaking Assistance Needed: family does housework. Pt able to dress and bathe on his own after set up Communication Communication: No difficulties Dominant Hand: Right         Vision/Perception Vision - History Baseline Vision: Wears glasses only for reading Patient Visual Report: No change from baseline Perception Perception: Within Functional Limits   Cognition  Cognition Arousal/Alertness: Awake/alert Behavior During Therapy: WFL for tasks assessed/performed Overall Cognitive Status: Within Functional Limits for tasks assessed    Extremity/Trunk Assessment Upper Extremity Assessment Upper Extremity Assessment: Overall WFL for tasks assessed Lower Extremity Assessment Lower Extremity Assessment: Defer to PT evaluation Cervical / Trunk Assessment Cervical / Trunk Assessment: Normal     Mobility Bed Mobility Overal bed mobility: Modified Independent General bed mobility comments: pt sitting on edge of bed eating lunch Transfers Overall transfer level: Needs assistance Equipment used: None Transfers: Lateral/Scoot Transfers  Lateral/Scoot Transfers: Min assist General transfer comment: scooting into wheelchair from edge of bed and back to bed          Balance Balance Overall balance assessment: Needs assistance Sitting-balance support: No upper extremity supported Sitting balance-Leahy Scale: Good Sitting balance - Comments: static balance good, but needs assist for safety with transfers (dynamic sitting balance)   End of Session OT - End of Session Equipment  Utilized During Treatment: Other (comment) (w/c) Activity Tolerance: Patient tolerated treatment well Patient left: in bed;with call bell/phone within reach;with family/visitor present  GO     Britt Bottom 04/12/2013, 3:25 PM

## 2013-04-12 NOTE — Progress Notes (Signed)
OT Cancellation Note  Patient Details Name: JARRY MIHALEK MRN: [id number] DOB: 01-Mar-1940   Cancelled Treatment:    Reason Eval/Treat Not Completed: Patient at procedure or test/ unavailable. Pt at hemodialysis, Rc re attempt later today as time allows/as appropriate  Britt Bottom, OT 04/12/2013, 10:08 AM

## 2013-04-13 DIAGNOSIS — A419 Sepsis, unspecified organism: Principal | ICD-10-CM

## 2013-04-13 LAB — BASIC METABOLIC PANEL
BUN: 28 mg/dL — ABNORMAL HIGH (ref 6–23)
CO2: 29 mEq/L (ref 19–32)
Calcium: 8.7 mg/dL (ref 8.4–10.5)
Chloride: 93 mEq/L — ABNORMAL LOW (ref 96–112)
Creatinine, Ser: 5.44 mg/dL — ABNORMAL HIGH (ref 0.50–1.35)
GFR, EST AFRICAN AMERICAN: 11 mL/min — AB (ref >90)
GFR, EST NON AFRICAN AMERICAN: 9 mL/min — AB (ref >90)
Glucose, Bld: 138 mg/dL — ABNORMAL HIGH (ref 70–99)
POTASSIUM: 4.4 meq/L (ref 3.7–5.3)
Sodium: 136 mEq/L — ABNORMAL LOW (ref 137–147)

## 2013-04-13 LAB — CBC
HCT: 28.1 % — ABNORMAL LOW (ref 39.0–52.0)
HEMOGLOBIN: 9 g/dL — AB (ref 13.0–17.0)
MCH: 29.6 pg (ref 26.0–34.0)
MCHC: 32 g/dL (ref 30.0–36.0)
MCV: 92.4 fL (ref 78.0–100.0)
PLATELETS: 284 10*3/uL (ref 150–400)
RBC: 3.04 MIL/uL — ABNORMAL LOW (ref 4.22–5.81)
RDW: 13.8 % (ref 11.5–15.5)
WBC: 7.7 10*3/uL (ref 4.0–10.5)

## 2013-04-13 LAB — GLUCOSE, CAPILLARY
GLUCOSE-CAPILLARY: 150 mg/dL — AB (ref 70–99)
GLUCOSE-CAPILLARY: 143 mg/dL — AB (ref 70–99)
GLUCOSE-CAPILLARY: 186 mg/dL — AB (ref 70–99)
GLUCOSE-CAPILLARY: 179 mg/dL — AB (ref 70–99)
Glucose-Capillary: 156 mg/dL — ABNORMAL HIGH (ref 70–99)

## 2013-04-13 MED ORDER — OXYCODONE-ACETAMINOPHEN 5-325 MG PO TABS: 1.0000 | ORAL_TABLET | ORAL | Status: DC | PRN

## 2013-04-13 MED ORDER — NEPRO/CARBSTEADY PO LIQD: 237.0000 mL | ORAL | Status: DC | PRN

## 2013-04-13 MED ORDER — DEXTROSE 5 % IV SOLN: 2.0000 g | INTRAVENOUS | Status: DC

## 2013-04-13 NOTE — Discharge Summary (Signed)
Physician Discharge Summary  [name] [id number] DOB: 1940/12/12 DOA: 04/08/2013  PCP: No PCP Per Patient  Admit date: 04/08/2013 Discharge date: 04/14/13 Recommendations for Outpatient Follow-up:  1. Alan [name] need to follow up with PCP in 2 weeks post discharge 2. Please obtain BMP to evaluate electrolytes and kidney function 3. Please also check CBC to evaluate Hg and Hct levels 4. 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   Specialty:  Orthopedic Surgery   Contact information:   [place] [address] [phone]       Diet: renal Wt Readings from Last 3 Encounters:  04/12/13 76.7 kg (169 lb 1.5 oz)  04/12/13 76.7 kg (169 lb 1.5 oz)  01/26/13 72.122 kg (159 lb)    History of present illness:  73 year old male with a history of diabetes mellitus, hypertension, CAD, ESRD presented withfever with chills that has been ongoing since last to 3 days along with that he has some soreness on one of the right leg that has been ongoing since last 2 weeks and progressively worsening. He went to see wound care clinic who recommended him to be evaluated by vascular ABI, since his ABI were significantly low 0.67 he was recommended to be evaluated by orthopedics who referred him to ED due to presence of fever and progressive infection. Patient had MRI of his right foot on 03/23/2013 which suggested osteomyelitis of the first proximal phalanx and distal phalanx with a small joint effusion in the first MTP joint on the right. The patient was started on vancomycin and Zosyn. Blood cultures were obtained. 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 Orthopedics was consulted. Dr. Sharol Given saw the patient. The patient underwent BKA 12/08/2013. Blood cultures grew Serratia. Antibiotics were adjusted. The patient was started on ceftazidime. Repeat blood cultures remained negative. Nephrology continued to follow the patient for his dialysis needs. The patient [name] be continued on ceftazidime on dialysis for 2 weeks beyond the first negative  culture.     Consultants: Ortho--[name] Renal--Shertz   Discharge Exam: Filed Vitals:   04/13/13 1134  BP: 130/71  Pulse: 76  Temp: 98.2 F (36.8 C)  Resp: 18   Filed Vitals:   04/12/13 2101 04/13/13 0520 04/13/13  0814 04/13/13 1134  BP: 127/66 134/76 113/56 130/71  Pulse: 69 70 71 76  Temp: 98.3 F (36.8 C) 98.6 F (37 C) 98.4 F (36.9 C) 98.2 F (36.8 C)  TempSrc: Oral Oral Oral Oral  Resp: 18 18 17 18   Height:      Weight: 76.7 kg (169 lb 1.5 oz)     SpO2: 93% 92% 95% 93%   General: A&O x 3, NAD, pleasant, cooperative Cardiovascular: RRR, no rub, no gallop, no S3 Respiratory: CTAB, no wheeze, no rhonchi Abdomen:soft, nontender, nondistended, positive bowel sounds Extremities: No edema, No lymphangitis, no petechiae; right stump is dressed  Discharge Instructions       Future Appointments Provider Department Dept Phone   04/20/2013 1:00 PM Rosetta Posner, MD [facility] [phone]       Medication List         aspirin EC 81 MG tablet  Take 81 mg by mouth every evening.     atorvastatin 80 MG tablet  Commonly known as:  LIPITOR  Take 80 mg by mouth every evening.     dextrose 5 % SOLN 50 mL with cefTAZidime 2 G SOLR 2 g  Inject 2 g into the vein every Monday, Wednesday, and Friday with hemodialysis. Last dose on 04/26/13.     feeding supplement (NEPRO CARB STEADY) Liqd  Take 237 mLs by mouth as needed (missed meal during dialysis.).     fluticasone 50 MCG/ACT nasal spray  Commonly known as:  FLONASE  Place 2 sprays into both nostrils daily as needed for allergies.     furosemide 40 MG tablet  Commonly known as:  LASIX  Take 20 mg by mouth every evening.     insulin NPH Human 100 UNIT/ML injection  Commonly known as:  HUMULIN N,NOVOLIN N  Inject 10 Units into the skin at bedtime. Use according to sliding scale.     NEPHRO-VITE PO  Take 1 tablet by mouth every evening.     nitroGLYCERIN 0.4 MG SL tablet  Commonly known as:  NITROSTAT  Place 0.4 mg under the tongue every 5 (five) minutes as needed for chest pain.     oxyCODONE-acetaminophen 5-325 MG per tablet  Commonly known as:  PERCOCET/ROXICET  Take 1-2 tablets by mouth every 4  (four) hours as needed for moderate pain.     sevelamer carbonate 800 MG tablet  Commonly known as:  RENVELA  Take 1,600 mg by mouth 2 (two) times daily.     sucralfate 1 GM/10ML suspension  Commonly known as:  CARAFATE  Take 10 mLs (1 g total) by mouth 3 (three) times daily with meals as needed (heartburn or indigestion).     TYLENOL PO  Take 1,200 mg by mouth 2 (two) times daily.         The results of significant diagnostics from this hospitalization (including imaging, microbiology, ancillary and laboratory) are listed below for reference.    Significant Diagnostic Studies: [name]  03/23/2013   CLINICAL DATA:  Draining Right foot ulcer  EXAM: MRI OF THE RIGHT TOES WITHOUT CONTRAST  TECHNIQUE: Multiplanar, multisequence MR imaging was performed. No intravenous contrast was administered.  COMPARISON:  DG TOE GREAT*R*  dated 01/26/2013  FINDINGS: There is marrow edema within the first proximal and distal phalanx without definite bone destruction. There is a metallic foreign body with susceptibility artifact in the web space of the first and second digits. There is a small joint effusion of the first MTP joint. There is soft tissue edema along the dorsal aspect of the midfoot forefoot. There is no focal fluid collection to suggest a drainable abscess.  The marrow signal is otherwise normal throughout the right foot.  IMPRESSION: 1. Marrow edema within the first proximal phalanx and first distal phalanx without definite bone destruction. This may represent reactive marrow edema versus osteomyelitis.  2. There is soft tissue edema along the dorsal aspect of the midfoot and forefoot likely reflecting cellulitis. There is a metallic foreign body in the web space between the first and second digits.  3 Small joint effusion of the first MTP joint which may be reactive although infection cannot be excluded. There are no marrow changes in the first metatarsal head which would argue against  infection.   Electronically Signed   By: Kathreen Devoid   On: 03/23/2013 15:34     Microbiology: Recent Results (from the past 240 hour(s))  CULTURE, BLOOD (ROUTINE X 2)     Status: None   Collection Time    04/08/13  9:03 PM      Result Value Ref Range Status   Specimen Description BLOOD FOREARM RIGHT   Final   Special Requests BOTTLES DRAWN AEROBIC AND ANAEROBIC 10CC   Final   Culture  Setup Time     Final   Value: 04/09/2013 04:16     Performed at [facility]   Culture     Final   Value: SERRATIA MARCESCENS     Note: Gram Stain Report Called to,Read Back By and Verified With: SUSIE BRENNAN 04/09/13 1525 BY SMITHERSJ Gram Stain Report Called to,Read Back By and Verified With: REBECCA QIU 04/09/13 AT 8:25PM [place]     Performed at [facility]   Report Status 04/11/2013 FINAL   Final   Organism ID, Bacteria SERRATIA MARCESCENS   Final  CULTURE, BLOOD (ROUTINE X 2)     Status: None   Collection Time    04/08/13  9:12 PM      Result Value Ref Range Status   Specimen Description BLOOD HAND RIGHT   Final   Special Requests BOTTLES DRAWN AEROBIC AND ANAEROBIC 10CC   Final   Culture  Setup Time     Final   Value: 04/09/2013 04:16     Performed at [facility]   Culture     Final   Value: SERRATIA MARCESCENS     Note: SUSCEPTIBILITIES PERFORMED ON PREVIOUS CULTURE WITHIN THE LAST 5 DAYS.     Note: Gram Stain Report Called to,Read Back By and Verified With: REBECCA QIU 04/09/13 AT 8:25PM [place]     Performed at [facility]   Report Status 04/11/2013 FINAL   Final  CULTURE, BLOOD (ROUTINE X 2)     Status: None   Collection Time    04/12/13  8:43 AM      Result Value Ref Range Status   Specimen Description BLOOD HEMODIALYSIS CATHETER   Final   Special Requests BOTTLES DRAWN AEROBIC AND ANAEROBIC 10CC   Final   Culture  Setup Time     Final   Value: 04/12/2013 14:16     Performed at [facility]  Final   Value:        BLOOD CULTURE  RECEIVED NO GROWTH TO DATE CULTURE Ryker BE HELD FOR 5 DAYS BEFORE ISSUING A FINAL NEGATIVE REPORT     Performed at [facility]   Report Status PENDING   Incomplete  CULTURE, BLOOD (ROUTINE X 2)     Status: None   Collection Time    04/12/13  8:53 AM      Result Value Ref Range Status   Specimen Description BLOOD HEMODIALYSIS CATHETER   Final   Special Requests BOTTLES DRAWN AEROBIC AND ANAEROBIC 10CC   Final   Culture  Setup Time     Final   Value: 04/12/2013 14:16     Performed at [facility]   Culture     Final   Value:        BLOOD CULTURE RECEIVED NO GROWTH TO DATE CULTURE Aldan BE HELD FOR 5 DAYS BEFORE ISSUING A FINAL NEGATIVE REPORT     Performed at [facility]   Report Status PENDING   Incomplete     Labs: Basic Metabolic Panel:  Recent Labs Lab 04/09/13 1648 04/10/13 0350 04/11/13 0500 04/12/13 0609 04/13/13 0600  NA 132* 135* 133* 135* 136*  K 4.5 3.7 4.5 4.9 4.4  CL 88* 93* 90* 93* 93*  CO2 27 28 24 25 29   GLUCOSE 220* 163* 206* 110* 138*  BUN 53* 22 39* 46* 28*  CREATININE 7.13* 4.13* 6.33* 7.79* 5.44*  CALCIUM 8.5 8.9 8.5 8.8 8.7  PHOS 3.4  --   --   --   --    Liver Function Tests:  Recent Labs Lab 04/09/13 0344 04/09/13 1648  AST 10  --   ALT 6  --   ALKPHOS 76  --   BILITOT 0.3  --   PROT 6.5  --   ALBUMIN 2.0* 2.0*   No results found for this basename: LIPASE, AMYLASE,  in the last 168 hours No results found for this basename: AMMONIA,  in the last 168 hours CBC:  Recent Labs Lab 04/08/13 1909  04/10/13 0350 04/10/13 1217 04/11/13 0500 04/12/13 0609 04/13/13 0600  WBC 14.5*  < > 11.7* 10.6* 8.6 8.1 7.7  NEUTROABS 11.4*  --   --   --   --   --   --   HGB 11.1*  < > 9.5* 9.4* 8.9* 8.8* 9.0*  HCT 33.4*  < > 29.8* 29.3* 28.3* 27.6* 28.1*  MCV 92.3  < > 92.5 93.0 93.1 92.0 92.4  PLT 336  < > 274 280 261 279 284  < > = values in this interval not displayed. Cardiac Enzymes: No results found for this basename:  CKTOTAL, CKMB, CKMBINDEX, TROPONINI,  in the last 168 hours BNP: No components found with this basename: POCBNP,  CBG:  Recent Labs Lab 04/12/13 1640 04/12/13 2059 04/12/13 2229 04/13/13 0812 04/13/13 1129  GLUCAP 196* 140* 150* 143* 156*    Time coordinating discharge:  Greater than 30 minutes  Signed:  Caydan Mctavish, DO [facility] Pager: [phone] 04/13/2013, 4:33 PM

## 2013-04-13 NOTE — Progress Notes (Signed)
   CARE MANAGEMENT NOTE 04/13/2013  Patient:  Alan [name], Alan [name]   Account Number:  [id number]  Date Initiated:  04/12/2013  Documentation initiated by:  Lizabeth Leyden  Subjective/Objective Assessment:   admitted with     Action/Plan:   active with [facility] prior to admission   Anticipated DC Date:  04/13/2013   Anticipated DC Plan:  [place]  CM consult      [facility] Choice  Resumption Of Svcs/PTA Provider   Choice offered to / List presented to:          [facility] arranged  HH-1 RN  [place].   Status of service:  Completed, signed off Medicare Important Message given?   (If response is "NO", the following Medicare IM given date fields Har[place] be blank) Date Medicare IM given:   Date Additional Medicare IM given:    Discharge Disposition:  [place]  Per UR Regulation:    If discussed at Long Length of Stay Meetings, dates discussed:    Comments:  04/13/2013  [place], [place] [phone] CM referral: home health RN, PT, OT  Met with patient and wife regarding home health services. Prior to admission he was active with [facility] for [facility] and want to continue at discharge  [facility] called with referral for home health RN, PT.  They are not able to provide HHPT  Spouse called to update regarding status of HHPT with [facility], they are not able to provide.  She selected [place].  [place] called with referral.

## 2013-04-13 NOTE — Progress Notes (Signed)
[facility] Progress Note   Subjective: sitting up on side of bed, no complaints, wants to know if he is going home today  Filed Vitals:   04/12/13 1719 04/12/13 2101 04/13/13 0520 04/13/13 0814  BP: 132/54 127/66 134/76 113/56  Pulse: 87 69 70 71  Temp: 98.2 F (36.8 C) 98.3 F (36.8 C) 98.6 F (37 C) 98.4 F (36.9 C)  TempSrc: Oral Oral Oral Oral  Resp: 18 18 18 17   Height:      Weight:  76.7 kg (169 lb 1.5 oz)    SpO2: 96% 93% 92% 95%  Exam Alert, no distress No jvd Clear bilat RRR no MRG Abd soft, nt, nd Bilat amp, no edema Neuro is nf, responds appropriately L forearm AVF patent  Dialysis: MWF [facility] 4h   83kg    2K Bath  Heparin 1000 bolus then 500/hr   L AVF Hectorol 1     Epo 5500     Venofer 50/wk  UA 100 prot, 0-2 rbc and wbc, no bact  Assessment: 1 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 2 Serratia bacteremia, on Fortaz w HD 3 ESRD on HD, recent start 4 Anemia, on darbe 40/wk 5 2HPTH, holding vit D, low Ca bath 6 HTN/volume- down 6kg after amp, no vol excess 7 DM on insulin   Plan- HD tomorrow if still here    Kelly Splinter MD  pager [phone]    cell [phone]  04/13/2013, 9:26 AM     Recent Labs Lab 04/09/13 1648  04/11/13 0500 04/12/13 0609 04/13/13 0600  NA 132*  < > 133* 135* 136*  K 4.5  < > 4.5 4.9 4.4  CL 88*  < > 90* 93* 93*  CO2 27  < > 24 25 29   GLUCOSE 220*  < > 206* 110* 138*  BUN 53*  < > 39* 46* 28*  CREATININE 7.13*  < > 6.33* 7.79* 5.44*  CALCIUM 8.5  < > 8.5 8.8 8.7  PHOS 3.4  --   --   --   --   < > = values in this interval not displayed.  Recent Labs Lab 04/09/13 0344 04/09/13 1648  AST 10  --   ALT 6  --   ALKPHOS 76  --   BILITOT 0.3  --   PROT 6.5  --   ALBUMIN 2.0* 2.0*    Recent Labs Lab 04/08/13 1909  04/11/13 0500 04/12/13 0609 04/13/13 0600  WBC 14.5*  < > 8.6 8.1 7.7  NEUTROABS 11.4*  --   --   --   --   HGB 11.1*  < > 8.9* 8.8* 9.0*  HCT 33.4*  < > 28.3* 27.6*  28.1*  MCV 92.3  < > 93.1 92.0 92.4  PLT 336  < > 261 279 284  < > = values in this interval not displayed. Marland Kitchen aspirin EC  81 mg Oral QPM  . atorvastatin  80 mg Oral QPM  . cefTAZidime (FORTAZ)  IV  2 g Intravenous Q M,W,F-HD  . darbepoetin  40 mcg Intravenous Q Fri-HD  . feeding supplement (PRO-STAT SUGAR FREE 64)  30 mL Oral Q1200  . heparin  5,000 Units Subcutaneous 3 times per day  . insulin aspart  0-15 Units Subcutaneous TID WC  . insulin aspart  0-5 Units Subcutaneous QHS  . insulin NPH Human  10 Units Subcutaneous QHS  . multivitamin  1 tablet Oral QHS  . sevelamer carbonate  1,600 mg Oral BID WC   . sodium chloride 20 mL/hr at 04/10/13 0706  . sodium chloride 20 mL/hr (04/10/13 0930)   sodium chloride, sodium chloride, acetaminophen, acetaminophen, feeding supplement (NEPRO CARB STEADY), fluticasone, heparin, HYDROmorphone (DILAUDID) injection, lidocaine (PF), lidocaine-prilocaine, metoCLOPramide (REGLAN) injection, metoCLOPramide, nitroGLYCERIN, ondansetron (ZOFRAN) IV, ondansetron, oxyCODONE-acetaminophen, pentafluoroprop-tetrafluoroeth, sucralfate

## 2013-04-13 NOTE — Progress Notes (Signed)
Occupational Therapy Treatment Patient Details Name: [name] MRN: [id number] DOB: 1940/06/06 Today's Date: 04/13/2013 Time: [id number] OT Time Calculation (min): 17 min  OT Assessment / Plan / Recommendation  History of present illness Alan [name] is a 73yo man with PMH of DM2, CAD, osteomyelitis, ESRD on HD, HTN s/p left BKA who presents for non healing foot wound on the right large toe and likely osteomyelitis now s/p R BKA   OT comments  Pt making excellent progress with functional goals ans should continue with acute OT services to increase level of function to return home safely  Follow Up Recommendations  Home health OT;Supervision/Assistance - 24 hour    Barriers to Discharge   none    Equipment Recommendations  Tub/shower bench    Recommendations for Other Services    Frequency Min 2X/week   Progress towards OT Goals Progress towards OT goals: Progressing toward goals  Plan Discharge plan remains appropriate    Precautions / Restrictions Precautions Precautions: Fall Precaution Comments: bil BKA Restrictions Weight Bearing Restrictions: Yes RLE Weight Bearing: Non weight bearing   Pertinent Vitals/Pain No c/o pain    ADL  Lower Body Bathing: Simulated;Min guard Where Assessed - Lower Body Bathing: Unsupported sitting Lower Body Dressing: Performed;Min guard Toilet Transfer: Performed;Min guard Toilet Transfer Method: Other (comment) (lateral scoot) Toilet Transfer Equipment: Grab bars;Comfort height toilet Toileting - Clothing Manipulation and Hygiene: Performed;Supervision/safety Where Assessed - Best boy and Hygiene: Lean right and/or left Tub/Shower Transfer: Simulated;Min guard    OT Diagnosis:    OT Problem List:   OT Treatment Interventions:     OT Goals(current goals can now be found in the care plan section) Acute Rehab OT Goals Patient Stated Goal: return home  Visit Information  Last OT Received On:  04/13/13 Assistance Needed: +1 History of Present Illness: Alan [name] is a 73yo man with PMH of DM2, CAD, osteomyelitis, ESRD on HD, HTN s/p left BKA who presents for non healing foot wound on the right large toe and likely osteomyelitis now s/p R BKA                Cognition  Cognition Arousal/Alertness: Awake/alert Behavior During Therapy: WFL for tasks assessed/performed Overall Cognitive Status: Within Functional Limits for tasks assessed    Mobility  Bed Mobility Overal bed mobility: Modified Independent Transfers Overall transfer level: Needs assistance Equipment used: None Transfers: Lateral/Scoot Transfers  Lateral/Scoot Transfers: Min guard          Balance Balance Sitting-balance support: No upper extremity supported Sitting balance-Leahy Scale: Good  End of Session OT - End of Session Equipment Utilized During Treatment: Gait belt (w/c) Activity Tolerance: Patient tolerated treatment well Patient left: in bed;with call bell/phone within reach  GO     Britt Bottom 04/13/2013, 2:42 PM

## 2013-04-14 LAB — GLUCOSE, CAPILLARY: GLUCOSE-CAPILLARY: 130 mg/dL — AB (ref 70–99)

## 2013-04-14 LAB — CBC
HCT: 27.6 % — ABNORMAL LOW (ref 39.0–52.0)
Hemoglobin: 8.8 g/dL — ABNORMAL LOW (ref 13.0–17.0)
MCH: 29.2 pg (ref 26.0–34.0)
MCHC: 31.9 g/dL (ref 30.0–36.0)
MCV: 91.7 fL (ref 78.0–100.0)
PLATELETS: 319 10*3/uL (ref 150–400)
RBC: 3.01 MIL/uL — ABNORMAL LOW (ref 4.22–5.81)
RDW: 13.8 % (ref 11.5–15.5)
WBC: 8.8 10*3/uL (ref 4.0–10.5)

## 2013-04-14 LAB — BASIC METABOLIC PANEL
BUN: 48 mg/dL — ABNORMAL HIGH (ref 6–23)
CALCIUM: 8.8 mg/dL (ref 8.4–10.5)
CO2: 27 mEq/L (ref 19–32)
CREATININE: 7.15 mg/dL — AB (ref 0.50–1.35)
Chloride: 93 mEq/L — ABNORMAL LOW (ref 96–112)
GFR calc Af Amer: 8 mL/min — ABNORMAL LOW (ref >90)
GFR, EST NON AFRICAN AMERICAN: 7 mL/min — AB (ref >90)
Glucose, Bld: 188 mg/dL — ABNORMAL HIGH (ref 70–99)
Potassium: 4.5 mEq/L (ref 3.7–5.3)
Sodium: 136 mEq/L — ABNORMAL LOW (ref 137–147)

## 2013-04-14 MED ORDER — HEPARIN SODIUM (PORCINE) 1000 UNIT/ML IJ SOLN
3000.0000 [IU] | Freq: Once | INTRAMUSCULAR | Status: AC
  Administered 2013-04-14: 3000 [IU] via INTRAVENOUS

## 2013-04-14 NOTE — Progress Notes (Signed)
Patient discharge teaching given, including activity, diet, follow-up appoints, and medications. Patient verbalized understanding of all discharge instructions. IV access was d/c'd. Vitals are stable. Skin is intact except as charted in most recent assessments. Pt to be escorted out by NT, to be driven home by family.  Myrl Bynum, MBA, BS, RN 

## 2013-04-14 NOTE — Progress Notes (Addendum)
Patient was seen examined and dialysis. Please see full discharge summary dictated by Dr. Shanon Brow Tat on 04/13/2013.   Patient has no complaints this morning. Patient's blood cultures remain negative. He Camara be discharged today to home with home health.  Time spent: 15 minutes   Tarren Sabree D.O.  [facility]  Pager [phone]   If 7PM-7AM, please contact night-coverage  [email or web address]  Password [facility]  04/14/2013, 11:46 AM

## 2013-04-14 NOTE — Procedures (Signed)
I was present at this dialysis session, have reviewed the session itself and made  appropriate changes  Kelly Splinter MD (pgr) [phone]    [phone] 04/14/2013, 10:39 AM

## 2013-04-14 NOTE — Progress Notes (Signed)
[facility] Progress Note   Subjective: Stable  Filed Vitals:   04/14/13 0830 04/14/13 0900 04/14/13 0930 04/14/13 1000  BP: 134/60 125/60 110/59 116/60  Pulse: 70 69 70 73  Temp:      TempSrc:      Resp:      Height:      Weight:      SpO2:      Exam Alert, no distress No jvd Clear bilat RRR no MRG Abd soft, nt, nd Bilat amp, no edema Neuro is nf, responds appropriately L forearm AVF patent  Dialysis: MWF [facility] 4h   83kg    2K Bath  Heparin 1000 bolus then 500/hr   L AVF Hectorol 1     Epo 5500     Venofer 50/wk  UA 100 prot, 0-2 rbc and wbc, no bact  Assessment: 1 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 2 Serratia bacteremia, on Fortaz w HD 3 ESRD on HD, recent start 4 Anemia, on darbe 40/wk 5 2HPTH, holding vit D, low Ca bath 6 HTN/volume- down 6kg after amp, no vol excess 7 DM on insulin   Plan- HD, for d/c after HD, I have spoken w [facility] staff regarding antibiotics thru 3/9 and new lower dry wt Eather Colas MD  pager [phone]    cell [phone]  04/14/2013, 10:41 AM     Recent Labs Lab 04/09/13 1648  04/12/13 0609 04/13/13 0600 04/14/13 0500  NA 132*  < > 135* 136* 136*  K 4.5  < > 4.9 4.4 4.5  CL 88*  < > 93* 93* 93*  CO2 27  < > 25 29 27   GLUCOSE 220*  < > 110* 138* 188*  BUN 53*  < > 46* 28* 48*  CREATININE 7.13*  < > 7.79* 5.44* 7.15*  CALCIUM 8.5  < > 8.8 8.7 8.8  PHOS 3.4  --   --   --   --   < > = values in this interval not displayed.  Recent Labs Lab 04/09/13 0344 04/09/13 1648  AST 10  --   ALT 6  --   ALKPHOS 76  --   BILITOT 0.3  --   PROT 6.5  --   ALBUMIN 2.0* 2.0*    Recent Labs Lab 04/08/13 1909  04/12/13 0609 04/13/13 0600 04/14/13 0500  WBC 14.5*  < > 8.1 7.7 8.8  NEUTROABS 11.4*  --   --   --   --   HGB 11.1*  < > 8.8* 9.0* 8.8*  HCT 33.4*  < > 27.6* 28.1* 27.6*  MCV 92.3  < > 92.0 92.4 91.7  PLT 336  < > 279 284 319  < > = values in this interval not  displayed. Marland Kitchen aspirin EC  81 mg Oral QPM  . atorvastatin  80 mg Oral QPM  . cefTAZidime (FORTAZ)  IV  2 g Intravenous Q M,W,F-HD  . darbepoetin  40 mcg Intravenous Q Fri-HD  . feeding supplement (PRO-STAT SUGAR FREE 64)  30 mL Oral Q1200  . heparin  5,000 Units Subcutaneous 3 times per day  . insulin aspart  0-15 Units Subcutaneous TID WC  . insulin aspart  0-5 Units Subcutaneous QHS  . insulin NPH Human  10 Units Subcutaneous QHS  . multivitamin  1 tablet Oral QHS  . sevelamer carbonate  1,600 mg Oral BID WC   . sodium chloride 20 mL/hr at 04/10/13 0706  .  sodium chloride 20 mL/hr (04/10/13 0930)   sodium chloride, sodium chloride, acetaminophen, acetaminophen, feeding supplement (NEPRO CARB STEADY), fluticasone, heparin, HYDROmorphone (DILAUDID) injection, lidocaine (PF), lidocaine-prilocaine, metoCLOPramide (REGLAN) injection, metoCLOPramide, nitroGLYCERIN, ondansetron (ZOFRAN) IV, ondansetron, oxyCODONE-acetaminophen, pentafluoroprop-tetrafluoroeth, sucralfate

## 2013-04-18 LAB — CULTURE, BLOOD (ROUTINE X 2)
CULTURE: NO GROWTH
Culture: NO GROWTH

## 2013-04-19 ENCOUNTER — Encounter: Payer: Self-pay | Admitting: Vascular Surgery

## 2013-04-20 ENCOUNTER — Encounter: Payer: Self-pay | Admitting: Vascular Surgery

## 2013-04-20 ENCOUNTER — Ambulatory Visit (INDEPENDENT_AMBULATORY_CARE_PROVIDER_SITE_OTHER): Payer: Medicare Other | Admitting: Vascular Surgery

## 2013-04-20 VITALS — BP 135/64 | HR 70 | Resp 16 | Ht 69.0 in | Wt 160.0 lb

## 2013-04-20 DIAGNOSIS — I70269 Atherosclerosis of native arteries of extremities with gangrene, unspecified extremity: Secondary | ICD-10-CM

## 2013-04-20 NOTE — Progress Notes (Signed)
The patient had been scheduled to see me in the office for evaluation of gangrenous changes of his right foot. Apparently according to the patient this was scheduled by the hospitalist during an inpatient admission. He recently underwent below-knee amputation by Dr. Sharol Given. This was on 04/10/2013. I reviewed his noninvasive studies which suggested no evidence of iliac occlusive disease hopefully Kerrion have adequate flow for healing this. He does have followup arranged with Dr. Sharol Given. I explained this to the patient and his family. We Rally not charge important days visit since he has artery had his gangrenous changes of his right foot addressed by Dr. Sharol Given.

## 2013-06-02 ENCOUNTER — Other Ambulatory Visit: Payer: Self-pay

## 2013-06-02 ENCOUNTER — Emergency Department (HOSPITAL_COMMUNITY)
Admission: EM | Admit: 2013-06-02 | Discharge: 2013-06-02 | Disposition: A | Discharge status: Home / Self Care | Payer: Medicare Other | Attending: Emergency Medicine | Admitting: Emergency Medicine

## 2013-06-02 ENCOUNTER — Emergency Department (HOSPITAL_COMMUNITY): Payer: Medicare Other

## 2013-06-02 ENCOUNTER — Encounter (HOSPITAL_COMMUNITY): Payer: Self-pay | Admitting: Emergency Medicine

## 2013-06-02 DIAGNOSIS — I120 Hypertensive chronic kidney disease with stage 5 chronic kidney disease or end stage renal disease: Secondary | ICD-10-CM | POA: Insufficient documentation

## 2013-06-02 DIAGNOSIS — M129 Arthropathy, unspecified: Secondary | ICD-10-CM | POA: Insufficient documentation

## 2013-06-02 DIAGNOSIS — J45909 Unspecified asthma, uncomplicated: Secondary | ICD-10-CM | POA: Insufficient documentation

## 2013-06-02 DIAGNOSIS — S88119A Complete traumatic amputation at level between knee and ankle, unspecified lower leg, initial encounter: Secondary | ICD-10-CM | POA: Insufficient documentation

## 2013-06-02 DIAGNOSIS — N186 End stage renal disease: Secondary | ICD-10-CM | POA: Insufficient documentation

## 2013-06-02 DIAGNOSIS — Z794 Long term (current) use of insulin: Secondary | ICD-10-CM | POA: Insufficient documentation

## 2013-06-02 DIAGNOSIS — Z992 Dependence on renal dialysis: Secondary | ICD-10-CM | POA: Insufficient documentation

## 2013-06-02 DIAGNOSIS — E7800 Pure hypercholesterolemia, unspecified: Secondary | ICD-10-CM | POA: Insufficient documentation

## 2013-06-02 DIAGNOSIS — R079 Chest pain, unspecified: Secondary | ICD-10-CM | POA: Insufficient documentation

## 2013-06-02 DIAGNOSIS — Z7982 Long term (current) use of aspirin: Secondary | ICD-10-CM | POA: Insufficient documentation

## 2013-06-02 DIAGNOSIS — Z951 Presence of aortocoronary bypass graft: Secondary | ICD-10-CM | POA: Insufficient documentation

## 2013-06-02 DIAGNOSIS — Z8673 Personal history of transient ischemic attack (TIA), and cerebral infarction without residual deficits: Secondary | ICD-10-CM | POA: Insufficient documentation

## 2013-06-02 DIAGNOSIS — E119 Type 2 diabetes mellitus without complications: Secondary | ICD-10-CM | POA: Insufficient documentation

## 2013-06-02 DIAGNOSIS — Z79899 Other long term (current) drug therapy: Secondary | ICD-10-CM | POA: Insufficient documentation

## 2013-06-02 DIAGNOSIS — I2510 Atherosclerotic heart disease of native coronary artery without angina pectoris: Secondary | ICD-10-CM | POA: Insufficient documentation

## 2013-06-02 LAB — CBC
HCT: 32.9 % — ABNORMAL LOW (ref 39.0–52.0)
Hemoglobin: 11.1 g/dL — ABNORMAL LOW (ref 13.0–17.0)
MCH: 30.4 pg (ref 26.0–34.0)
MCHC: 33.7 g/dL (ref 30.0–36.0)
MCV: 90.1 fL (ref 78.0–100.0)
Platelets: 243 10*3/uL (ref 150–400)
RBC: 3.65 MIL/uL — ABNORMAL LOW (ref 4.22–5.81)
RDW: 14.6 % (ref 11.5–15.5)
WBC: 7.2 10*3/uL (ref 4.0–10.5)

## 2013-06-02 LAB — BASIC METABOLIC PANEL
BUN: 60 mg/dL — AB (ref 6–23)
CHLORIDE: 94 meq/L — AB (ref 96–112)
CO2: 24 mEq/L (ref 19–32)
Calcium: 8.9 mg/dL (ref 8.4–10.5)
Creatinine, Ser: 6.62 mg/dL — ABNORMAL HIGH (ref 0.50–1.35)
GFR, EST AFRICAN AMERICAN: 8 mL/min — AB (ref >90)
GFR, EST NON AFRICAN AMERICAN: 7 mL/min — AB (ref >90)
Glucose, Bld: 174 mg/dL — ABNORMAL HIGH (ref 70–99)
Potassium: 4.6 mEq/L (ref 3.7–5.3)
SODIUM: 133 meq/L — AB (ref 137–147)

## 2013-06-02 LAB — TROPONIN I

## 2013-06-02 MED ORDER — ASPIRIN 81 MG PO CHEW
CHEWABLE_TABLET | ORAL | Status: AC
  Administered 2013-06-02: 324 mg via OROMUCOSAL
  Filled 2013-06-02: qty 4

## 2013-06-02 MED ORDER — NITROGLYCERIN 0.4 MG SL SUBL
0.4000 mg | SUBLINGUAL_TABLET | SUBLINGUAL | Status: DC | PRN
  Administered 2013-06-02 (×2): 0.4 mg via SUBLINGUAL
  Filled 2013-06-02: qty 1

## 2013-06-02 MED ORDER — ASPIRIN 325 MG PO TABS: 325.0000 mg | ORAL_TABLET | ORAL | Status: DC

## 2013-06-02 NOTE — ED Notes (Signed)
Chest pain onset 30 minutes ago at dialysis

## 2013-06-02 NOTE — ED Notes (Signed)
edp vo to give just one more ntg sl to total 2.

## 2013-06-02 NOTE — ED Notes (Signed)
Pt ready to go. EDP aware. Nad.

## 2013-06-02 NOTE — ED Provider Notes (Signed)
CSN: [id number]     Arrival date & time 06/02/13  1047 History  This chart was scribed for Nat Christen, MD by Ludger Nutting, ED Scribe. This patient was seen in room APA06/APA06 and the patient's care was started 11:23 AM.    Chief Complaint  Patient presents with  . Chest Pain      The history is provided by the patient. No language interpreter was used.   HPI Comments: Alan [name] is a 73 y.o. male with past medical history of DM, HTN, hypercholesteremia, CAD, CVA who presents to the Emergency Department complaining of constant left chest pain that began 1.5 hours ago. Patient states he was at the dialysis center beginning his treatment when this pain began. He describes the chest pain as heaviness and states it slightly improved with nitro (1 dose at the dialysis facility and 1 dose in the ED). He has a history of a CABG about 10 years ago. He denies SOB, diaphoresis, nausea.   Past Medical History  Diagnosis Date  . Diabetes mellitus   . Hypertension   . Gout   . Coronary artery disease   . Stroke   . Asthma   . Renal insufficiency   . Hypercholesteremia   . Arthritis   . DDD (degenerative disc disease), lumbar   . DDD (degenerative disc disease), cervical   . Gangrene of toe Feb. 2015    Right  great     Past Surgical History  Procedure Laterality Date  . Coronary artery bypass graft    . Below knee leg amputation Left   . Esophagogastroduodenoscopy Left 05/24/2012    [id number] dilated baggy but otherwise a normal/ Small hiatal hernia. Gastric ulcer -S/P biopsy  . Amputation Right 04/10/2013    Procedure: Right Below Knee Amputation;  Surgeon: Newt Minion, MD;  Location: [facility];  Service: Orthopedics;  Laterality: Right;  Right Below Knee Amputation   Family History  Problem Relation Age of Onset  . Colon cancer Neg Hx    History  Substance Use Topics  . Smoking status: Never Smoker   . Smokeless tobacco: Never Used  . Alcohol Use: No    Review of  Systems  A complete 10 system review of systems was obtained and all systems are negative except as noted in the HPI and PMH.    Allergies  Codeine and Yellow jacket venom  Home Medications   Prior to Admission medications   Medication Sig Start Date End Date Taking? Authorizing Provider  Acetaminophen (TYLENOL PO) Take 1,200 mg by mouth 2 (two) times daily.    Historical Provider, MD  aspirin EC 81 MG tablet Take 81 mg by mouth every evening.     Historical Provider, MD  atorvastatin (LIPITOR) 80 MG tablet Take 80 mg by mouth every evening.    Historical Provider, MD  B Complex-C-Folic Acid (NEPHRO-VITE PO) Take 1 tablet by mouth every evening.     Historical Provider, MD  dextrose 5 % SOLN 50 mL with cefTAZidime 2 G SOLR 2 g Inject 2 g into the vein every Monday, Wednesday, and Friday with hemodialysis. Last dose on 04/26/13. 04/13/13   Orson Eva, MD  fluticasone [facility]) 50 MCG/ACT nasal spray Place 2 sprays into both nostrils daily as needed for allergies.     Historical Provider, MD  furosemide (LASIX) 40 MG tablet Take 20 mg by mouth every evening.    Historical Provider, MD  insulin NPH (HUMULIN N,NOVOLIN N) 100 UNIT/ML injection Inject  10 Units into the skin at bedtime. Use according to sliding scale.    Historical Provider, MD  nitroGLYCERIN (NITROSTAT) 0.4 MG SL tablet Place 0.4 mg under the tongue every 5 (five) minutes as needed for chest pain.     Historical Provider, MD  Nutritional Supplements (FEEDING SUPPLEMENT, NEPRO CARB STEADY,) LIQD Take 237 mLs by mouth as needed (missed meal during dialysis.). 04/13/13   Orson Eva, MD  oxyCODONE-acetaminophen (PERCOCET/ROXICET) 5-325 MG per tablet Take 1-2 tablets by mouth every 4 (four) hours as needed for moderate pain. 04/13/13   Orson Eva, MD  sevelamer carbonate (RENVELA) 800 MG tablet Take 1,600 mg by mouth 2 (two) times daily.     Historical Provider, MD  sucralfate (CARAFATE) 1 GM/10ML suspension Take 10 mLs (1 g total) by mouth 3  (three) times daily with meals as needed (heartburn or indigestion). 05/25/12   Nimish C Anastasio Champion, MD   BP 145/65  Pulse 75  Temp(Src) 98 F (36.7 C) (Oral)  Resp 11  SpO2 99% Physical Exam  Nursing note and vitals reviewed. Constitutional: He is oriented to person, place, and time. He appears well-developed and well-nourished.  HENT:  Head: Normocephalic and atraumatic.  Eyes: Conjunctivae and EOM are normal. Pupils are equal, round, and reactive to light.  Neck: Normal range of motion. Neck supple.  Cardiovascular: Normal rate, regular rhythm and normal heart sounds.   Pulmonary/Chest: Effort normal and breath sounds normal. No respiratory distress.  Abdominal: Soft. Bowel sounds are normal.  Musculoskeletal: Normal range of motion.  Bilateral BKA   Neurological: He is alert and oriented to person, place, and time.  Skin: Skin is warm and dry. No pallor.  Color good  Psychiatric: He has a normal mood and affect. His behavior is normal.    ED Course  Procedures (including critical care time)  DIAGNOSTIC STUDIES: Oxygen Saturation is 100% on RA, normal by my interpretation.    COORDINATION OF CARE: 11:32 AM Discussed treatment plan with pt at bedside and pt agreed to plan.   Labs Review Labs Reviewed  CBC - Abnormal; Notable for the following:    RBC 3.65 (*)    Hemoglobin 11.1 (*)    HCT 32.9 (*)    All other components within normal limits  BASIC METABOLIC PANEL - Abnormal; Notable for the following:    Sodium 133 (*)    Chloride 94 (*)    Glucose, Bld 174 (*)    BUN 60 (*)    Creatinine, Ser 6.62 (*)    GFR calc non Af Amer 7 (*)    GFR calc Af Amer 8 (*)    All other components within normal limits  TROPONIN I    Imaging Review Dg Chest Port 1 View  06/02/2013   CLINICAL DATA:  Chest pain  EXAM: PORTABLE CHEST - 1 VIEW  COMPARISON:  February 12, 2013  FINDINGS: There is scarring in the left lower lobe. There is no edema or consolidation. Heart is mildly  prominent with normal pulmonary vascularity, stable. Patient is status post coronary artery bypass grafting. Scoliosis is again noted.  IMPRESSION: Scarring left base.  Scoliosis.  No edema or consolidation.   Electronically Signed   By: Lowella Grip M.D.   On: 06/02/2013 11:11     EKG Interpretation None      Date: 06/02/2013  Rate:78  Rhythm: normal sinus rhythm  QRS Axis: normal  Intervals: normal  ST/T Wave abnormalities: normal  Conduction Disutrbances: none  Narrative Interpretation:  unremarkable    MDM   Final diagnoses:  Chest pain  ESRD on dialysis    Patient feels much better after 2 nitroglycerin tablets. He does not want to stay in hospital. EKG and troponin are negative. Discharge to get dialysis today.  I personally performed the services described in this documentation, which was scribed in my presence. The recorded information has been reviewed and is accurate.   Nat Christen, MD 06/02/13 [phone]

## 2013-06-02 NOTE — Discharge Instructions (Signed)
Go to dialysis today. Followup your primary care Dr. or return if worse

## 2013-06-10 ENCOUNTER — Ambulatory Visit: Payer: Self-pay | Admitting: Vascular Surgery

## 2013-06-16 ENCOUNTER — Observation Stay (HOSPITAL_COMMUNITY)
Admission: EM | Admit: 2013-06-16 | Discharge: 2013-06-17 | Disposition: A | Discharge status: Home / Self Care | Payer: Medicare Other | Attending: Family Medicine | Admitting: Family Medicine

## 2013-06-16 ENCOUNTER — Encounter (HOSPITAL_COMMUNITY): Payer: Self-pay | Admitting: Emergency Medicine

## 2013-06-16 ENCOUNTER — Emergency Department (HOSPITAL_COMMUNITY): Payer: Medicare Other

## 2013-06-16 DIAGNOSIS — E41 Nutritional marasmus: Secondary | ICD-10-CM | POA: Insufficient documentation

## 2013-06-16 DIAGNOSIS — Z794 Long term (current) use of insulin: Secondary | ICD-10-CM | POA: Insufficient documentation

## 2013-06-16 DIAGNOSIS — E43 Unspecified severe protein-calorie malnutrition: Secondary | ICD-10-CM

## 2013-06-16 DIAGNOSIS — S88119A Complete traumatic amputation at level between knee and ankle, unspecified lower leg, initial encounter: Secondary | ICD-10-CM | POA: Insufficient documentation

## 2013-06-16 DIAGNOSIS — M5137 Other intervertebral disc degeneration, lumbosacral region: Secondary | ICD-10-CM | POA: Insufficient documentation

## 2013-06-16 DIAGNOSIS — Z6829 Body mass index (BMI) 29.0-29.9, adult: Secondary | ICD-10-CM | POA: Insufficient documentation

## 2013-06-16 DIAGNOSIS — M51379 Other intervertebral disc degeneration, lumbosacral region without mention of lumbar back pain or lower extremity pain: Secondary | ICD-10-CM | POA: Insufficient documentation

## 2013-06-16 DIAGNOSIS — E1159 Type 2 diabetes mellitus with other circulatory complications: Secondary | ICD-10-CM

## 2013-06-16 DIAGNOSIS — Z7982 Long term (current) use of aspirin: Secondary | ICD-10-CM | POA: Insufficient documentation

## 2013-06-16 DIAGNOSIS — I2510 Atherosclerotic heart disease of native coronary artery without angina pectoris: Secondary | ICD-10-CM | POA: Insufficient documentation

## 2013-06-16 DIAGNOSIS — I798 Other disorders of arteries, arterioles and capillaries in diseases classified elsewhere: Secondary | ICD-10-CM

## 2013-06-16 DIAGNOSIS — Z992 Dependence on renal dialysis: Secondary | ICD-10-CM | POA: Insufficient documentation

## 2013-06-16 DIAGNOSIS — M25519 Pain in unspecified shoulder: Secondary | ICD-10-CM | POA: Insufficient documentation

## 2013-06-16 DIAGNOSIS — K259 Gastric ulcer, unspecified as acute or chronic, without hemorrhage or perforation: Secondary | ICD-10-CM | POA: Insufficient documentation

## 2013-06-16 DIAGNOSIS — N186 End stage renal disease: Secondary | ICD-10-CM

## 2013-06-16 DIAGNOSIS — Z89519 Acquired absence of unspecified leg below knee: Secondary | ICD-10-CM

## 2013-06-16 DIAGNOSIS — Z8673 Personal history of transient ischemic attack (TIA), and cerebral infarction without residual deficits: Secondary | ICD-10-CM | POA: Insufficient documentation

## 2013-06-16 DIAGNOSIS — R0602 Shortness of breath: Secondary | ICD-10-CM | POA: Insufficient documentation

## 2013-06-16 DIAGNOSIS — K219 Gastro-esophageal reflux disease without esophagitis: Secondary | ICD-10-CM | POA: Insufficient documentation

## 2013-06-16 DIAGNOSIS — I120 Hypertensive chronic kidney disease with stage 5 chronic kidney disease or end stage renal disease: Secondary | ICD-10-CM | POA: Insufficient documentation

## 2013-06-16 DIAGNOSIS — Z951 Presence of aortocoronary bypass graft: Secondary | ICD-10-CM | POA: Insufficient documentation

## 2013-06-16 DIAGNOSIS — I10 Essential (primary) hypertension: Secondary | ICD-10-CM | POA: Diagnosis present

## 2013-06-16 DIAGNOSIS — M109 Gout, unspecified: Secondary | ICD-10-CM | POA: Insufficient documentation

## 2013-06-16 DIAGNOSIS — R079 Chest pain, unspecified: Principal | ICD-10-CM

## 2013-06-16 DIAGNOSIS — J45909 Unspecified asthma, uncomplicated: Secondary | ICD-10-CM | POA: Insufficient documentation

## 2013-06-16 HISTORY — DX: Gastric ulcer, unspecified as acute or chronic, without hemorrhage or perforation: K25.9

## 2013-06-16 LAB — BASIC METABOLIC PANEL
BUN: 59 mg/dL — ABNORMAL HIGH (ref 6–23)
CALCIUM: 8.9 mg/dL (ref 8.4–10.5)
CHLORIDE: 93 meq/L — AB (ref 96–112)
CO2: 29 meq/L (ref 19–32)
Creatinine, Ser: 6.29 mg/dL — ABNORMAL HIGH (ref 0.50–1.35)
GFR calc Af Amer: 9 mL/min — ABNORMAL LOW (ref >90)
GFR calc non Af Amer: 8 mL/min — ABNORMAL LOW (ref >90)
GLUCOSE: 159 mg/dL — AB (ref 70–99)
POTASSIUM: 4.7 meq/L (ref 3.7–5.3)
Sodium: 136 mEq/L — ABNORMAL LOW (ref 137–147)

## 2013-06-16 LAB — GLUCOSE, CAPILLARY: GLUCOSE-CAPILLARY: 254 mg/dL — AB (ref 70–99)

## 2013-06-16 LAB — CBC
HCT: 33.8 % — ABNORMAL LOW (ref 39.0–52.0)
HEMOGLOBIN: 11.2 g/dL — AB (ref 13.0–17.0)
MCH: 30.3 pg (ref 26.0–34.0)
MCHC: 33.1 g/dL (ref 30.0–36.0)
MCV: 91.4 fL (ref 78.0–100.0)
PLATELETS: 254 10*3/uL (ref 150–400)
RBC: 3.7 MIL/uL — ABNORMAL LOW (ref 4.22–5.81)
RDW: 14.9 % (ref 11.5–15.5)
WBC: 8.7 10*3/uL (ref 4.0–10.5)

## 2013-06-16 LAB — TROPONIN I

## 2013-06-16 MED ORDER — INSULIN ASPART 100 UNIT/ML ~~LOC~~ SOLN
0.0000 [IU] | Freq: Every day | SUBCUTANEOUS | Status: DC
  Administered 2013-06-16: 3 [IU] via SUBCUTANEOUS

## 2013-06-16 MED ORDER — INSULIN ASPART 100 UNIT/ML ~~LOC~~ SOLN
0.0000 [IU] | Freq: Three times a day (TID) | SUBCUTANEOUS | Status: DC
  Administered 2013-06-17: 3 [IU] via SUBCUTANEOUS

## 2013-06-16 MED ORDER — RENA-VITE PO TABS
1.0000 | ORAL_TABLET | Freq: Every day | ORAL | Status: DC
  Administered 2013-06-16: 1 via ORAL
  Filled 2013-06-16: qty 1

## 2013-06-16 MED ORDER — SODIUM CHLORIDE 0.9 % IJ SOLN
3.0000 mL | Freq: Two times a day (BID) | INTRAMUSCULAR | Status: DC
  Administered 2013-06-16: 3 mL via INTRAVENOUS

## 2013-06-16 MED ORDER — FLUTICASONE PROPIONATE 50 MCG/ACT NA SUSP
2.0000 | Freq: Every day | NASAL | Status: DC | PRN
  Filled 2013-06-16: qty 16

## 2013-06-16 MED ORDER — NITROGLYCERIN 0.4 MG SL SUBL: 0.4000 mg | SUBLINGUAL_TABLET | SUBLINGUAL | Status: DC | PRN

## 2013-06-16 MED ORDER — BISACODYL 10 MG RE SUPP: 10.0000 mg | Freq: Every day | RECTAL | Status: DC | PRN

## 2013-06-16 MED ORDER — SODIUM CHLORIDE 0.9 % IV SOLN
INTRAVENOUS | Status: DC
Start: 2013-06-17 — End: 2013-06-16

## 2013-06-16 MED ORDER — ACETAMINOPHEN 325 MG PO TABS: 650.0000 mg | ORAL_TABLET | Freq: Four times a day (QID) | ORAL | Status: DC | PRN

## 2013-06-16 MED ORDER — SODIUM CHLORIDE 0.9 % IV SOLN: 250.0000 mL | INTRAVENOUS | Status: DC | PRN

## 2013-06-16 MED ORDER — ACETAMINOPHEN 650 MG RE SUPP: 650.0000 mg | Freq: Four times a day (QID) | RECTAL | Status: DC | PRN

## 2013-06-16 MED ORDER — GI COCKTAIL ~~LOC~~: 30.0000 mL | Freq: Two times a day (BID) | ORAL | Status: DC | PRN

## 2013-06-16 MED ORDER — NEPHRO-VITE 0.8 MG PO TABS
1.0000 | ORAL_TABLET | Freq: Every day | ORAL | Status: DC
  Filled 2013-06-16 (×2): qty 1

## 2013-06-16 MED ORDER — SEVELAMER CARBONATE 800 MG PO TABS
1600.0000 mg | ORAL_TABLET | Freq: Three times a day (TID) | ORAL | Status: DC
  Administered 2013-06-16 – 2013-06-17 (×2): 1600 mg via ORAL
  Filled 2013-06-16 (×2): qty 2

## 2013-06-16 MED ORDER — SEVELAMER CARBONATE 800 MG PO TABS: 800.0000 mg | ORAL_TABLET | ORAL | Status: DC | PRN

## 2013-06-16 MED ORDER — PANTOPRAZOLE SODIUM 40 MG PO TBEC
40.0000 mg | DELAYED_RELEASE_TABLET | Freq: Two times a day (BID) | ORAL | Status: DC
  Administered 2013-06-17: 40 mg via ORAL
  Filled 2013-06-16: qty 1

## 2013-06-16 MED ORDER — LABETALOL HCL 5 MG/ML IV SOLN
10.0000 mg | INTRAVENOUS | Status: DC | PRN
  Administered 2013-06-16: 10 mg via INTRAVENOUS
  Filled 2013-06-16: qty 4

## 2013-06-16 MED ORDER — FUROSEMIDE 20 MG PO TABS
20.0000 mg | ORAL_TABLET | Freq: Every evening | ORAL | Status: DC
  Administered 2013-06-16: 20 mg via ORAL
  Filled 2013-06-16: qty 1

## 2013-06-16 MED ORDER — HYDROCODONE-ACETAMINOPHEN 5-325 MG PO TABS: 1.0000 | ORAL_TABLET | ORAL | Status: DC | PRN

## 2013-06-16 MED ORDER — ASPIRIN EC 325 MG PO TBEC
325.0000 mg | DELAYED_RELEASE_TABLET | Freq: Every day | ORAL | Status: DC
  Administered 2013-06-17: 325 mg via ORAL
  Filled 2013-06-16: qty 1

## 2013-06-16 MED ORDER — MORPHINE SULFATE 2 MG/ML IJ SOLN: 1.0000 mg | INTRAMUSCULAR | Status: DC | PRN

## 2013-06-16 MED ORDER — MAGNESIUM CITRATE PO SOLN: 1.0000 | Freq: Once | ORAL | Status: AC | PRN

## 2013-06-16 MED ORDER — SODIUM CHLORIDE 0.9 % IJ SOLN: 3.0000 mL | INTRAMUSCULAR | Status: DC | PRN

## 2013-06-16 MED ORDER — NITROGLYCERIN 0.4 MG SL SUBL
0.4000 mg | SUBLINGUAL_TABLET | SUBLINGUAL | Status: DC | PRN
  Administered 2013-06-16 (×3): 0.4 mg via SUBLINGUAL
  Filled 2013-06-16: qty 1

## 2013-06-16 MED ORDER — ATORVASTATIN CALCIUM 40 MG PO TABS
80.0000 mg | ORAL_TABLET | Freq: Every evening | ORAL | Status: DC
  Administered 2013-06-16: 80 mg via ORAL
  Filled 2013-06-16: qty 2

## 2013-06-16 MED ORDER — NEPRO/CARBSTEADY PO LIQD: 237.0000 mL | ORAL | Status: DC | PRN

## 2013-06-16 MED ORDER — HEPARIN SODIUM (PORCINE) 5000 UNIT/ML IJ SOLN
5000.0000 [IU] | Freq: Three times a day (TID) | INTRAMUSCULAR | Status: DC
  Administered 2013-06-16 – 2013-06-17 (×2): 5000 [IU] via SUBCUTANEOUS
  Filled 2013-06-16 (×2): qty 1

## 2013-06-16 MED ORDER — ONDANSETRON HCL 4 MG PO TABS: 4.0000 mg | ORAL_TABLET | Freq: Four times a day (QID) | ORAL | Status: DC | PRN

## 2013-06-16 MED ORDER — ONDANSETRON HCL 4 MG/2ML IJ SOLN: 4.0000 mg | Freq: Four times a day (QID) | INTRAMUSCULAR | Status: DC | PRN

## 2013-06-16 MED ORDER — ASPIRIN 81 MG PO CHEW
324.0000 mg | CHEWABLE_TABLET | Freq: Once | ORAL | Status: AC
  Administered 2013-06-16: 324 mg via ORAL
  Filled 2013-06-16: qty 4

## 2013-06-16 MED ORDER — LABETALOL HCL 5 MG/ML IV SOLN: 5.0000 mg | INTRAVENOUS | Status: DC | PRN

## 2013-06-16 MED ORDER — ALUM & MAG HYDROXIDE-SIMETH 200-200-20 MG/5ML PO SUSP: 30.0000 mL | Freq: Four times a day (QID) | ORAL | Status: DC | PRN

## 2013-06-16 MED ORDER — SODIUM CHLORIDE 0.9 % IJ SOLN
3.0000 mL | Freq: Two times a day (BID) | INTRAMUSCULAR | Status: DC
  Administered 2013-06-17: 3 mL via INTRAVENOUS

## 2013-06-16 NOTE — ED Provider Notes (Addendum)
CSN: [id number]     Arrival date & time 06/16/13  1020 History  This chart was scribed for Alan Kung, MD by Ludger Nutting, ED Scribe. This patient was seen in room APA19/APA19 and the patient's care was started 11:34 AM.    Chief Complaint  Patient presents with  . Chest Pain      The history is provided by the patient. No language interpreter was used.    HPI Comments: Alan [name] is a 73 y.o. male brought in by ambulance, who presents to the Emergency Department complaining of left sided chest pain radiating to the left arm that began about 5.5 hours ago. He describes the pain as heaviness and rates the pain as 3/10 currently, 5/10 at max. Patient states he went to dialysis today but began having this pain prior to starting treatment. He normally receives dialysis treatments MWF. He reports producing some urine. He was given 2 Nitroglycerin 0.4 mg SL en route. He was seen on 06/02/13 for similar symptoms and was discharged home. He denies SOB, nausea, vomiting.   Past Medical History  Diagnosis Date  . Diabetes mellitus   . Hypertension   . Gout   . Coronary artery disease   . Stroke   . Asthma   . Renal insufficiency   . Hypercholesteremia   . Arthritis   . DDD (degenerative disc disease), lumbar   . DDD (degenerative disc disease), cervical   . Gangrene of toe Feb. 2015    Right  great     Past Surgical History  Procedure Laterality Date  . Coronary artery bypass graft    . Below knee leg amputation Left   . Esophagogastroduodenoscopy Left 05/24/2012    [id number] dilated baggy but otherwise a normal/ Small hiatal hernia. Gastric ulcer -S/P biopsy  . Amputation Right 04/10/2013    Procedure: Right Below Knee Amputation;  Surgeon: Newt Minion, MD;  Location: [facility];  Service: Orthopedics;  Laterality: Right;  Right Below Knee Amputation   Family History  Problem Relation Age of Onset  . Colon cancer Neg Hx    History  Substance Use Topics  . Smoking status:  Never Smoker   . Smokeless tobacco: Never Used  . Alcohol Use: No    Review of Systems  Constitutional: Negative for fever and chills.  HENT: Negative for rhinorrhea and sore throat.   Eyes: Negative for visual disturbance.  Respiratory: Positive for cough. Negative for shortness of breath.   Cardiovascular: Positive for chest pain. Negative for leg swelling.  Gastrointestinal: Negative for nausea, vomiting, abdominal pain and diarrhea.  Genitourinary: Negative for dysuria.  Musculoskeletal: Negative for back pain and neck pain.  Skin: Negative for rash.  Neurological: Negative for headaches.  Hematological: Bruises/bleeds easily.  Psychiatric/Behavioral: Negative for confusion.      Allergies  Codeine and Yellow jacket venom  Home Medications   Prior to Admission medications   Medication Sig Start Date End Date Taking? Authorizing Provider  acetaminophen (TYLENOL) 500 MG tablet Take 500 mg by mouth 2 (two) times daily.   Yes Historical Provider, MD  aspirin EC 81 MG tablet Take 81 mg by mouth at bedtime.    Yes Historical Provider, MD  atorvastatin (LIPITOR) 80 MG tablet Take 80 mg by mouth every evening.   Yes Historical Provider, MD  B Complex-C-Folic Acid (NEPHRO-VITE PO) Take 1 tablet by mouth every evening.    Yes Historical Provider, MD  dextrose 5 % SOLN 50 mL with cefTAZidime  2 G SOLR 2 g Inject 2 g into the vein every Monday, Wednesday, and Friday with hemodialysis. Last dose on 04/26/13. 04/13/13  Yes Orson Eva, MD  fluticasone [facility]) 50 MCG/ACT nasal spray Place 2 sprays into both nostrils daily as needed for allergies.    Yes Historical Provider, MD  furosemide (LASIX) 40 MG tablet Take 20 mg by mouth every evening.   Yes Historical Provider, MD  HYDROcodone-acetaminophen (NORCO/VICODIN) 5-325 MG per tablet Take 1 tablet by mouth every 4 (four) hours as needed. pain   Yes Historical Provider, MD  insulin NPH (HUMULIN N,NOVOLIN N) 100 UNIT/ML injection Inject 10  Units into the skin at bedtime. Use according to sliding scale.   Yes Historical Provider, MD  nitroGLYCERIN (NITROSTAT) 0.4 MG SL tablet Place 0.4 mg under the tongue every 5 (five) minutes as needed for chest pain.    Yes Historical Provider, MD  Nutritional Supplements (FEEDING SUPPLEMENT, NEPRO CARB STEADY,) LIQD Take 237 mLs by mouth as needed (missed meal during dialysis.). 04/13/13  Yes Orson Eva, MD  sevelamer carbonate (RENVELA) 800 MG tablet Take 800-1,600 mg by mouth 3 (three) times daily with meals. Patient takes 2 tablets with meals and 1 tablet with snack   Yes Historical Provider, MD   There were no vitals taken for this visit. Physical Exam  Nursing note and vitals reviewed. Constitutional: He is oriented to person, place, and time. He appears well-developed and well-nourished.  HENT:  Head: Normocephalic and atraumatic.  Cardiovascular: Normal rate, regular rhythm and normal heart sounds.   No murmur heard. Pulmonary/Chest: Effort normal. He has no wheezes. He has rhonchi (bilateral).  Abdominal: Soft. Bowel sounds are normal. He exhibits no distension. There is no tenderness.  Musculoskeletal:  Bilateral BKA. Dialysis shunt in left arm with good thrill.   Neurological: He is alert and oriented to person, place, and time.  Skin: Skin is warm and dry.  Psychiatric: He has a normal mood and affect.    ED Course  Procedures (including critical care time)  DIAGNOSTIC STUDIES: Oxygen Saturation is 97% on RA, adequate by my interpretation.    COORDINATION OF CARE: 11:37 AM Discussed treatment plan with pt at bedside and pt agreed to plan.   Labs Review Labs Reviewed  CBC - Abnormal; Notable for the following:    RBC 3.70 (*)    Hemoglobin 11.2 (*)    HCT 33.8 (*)    All other components within normal limits  BASIC METABOLIC PANEL  TROPONIN I   Results for orders placed during the hospital encounter of 06/16/13  CBC      Result Value Ref Range   WBC 8.7  4.0 -  10.5 K/uL   RBC 3.70 (*) 4.22 - 5.81 MIL/uL   Hemoglobin 11.2 (*) 13.0 - 17.0 g/dL   HCT 33.8 (*) 39.0 - 52.0 %   MCV 91.4  78.0 - 100.0 fL   MCH 30.3  26.0 - 34.0 pg   MCHC 33.1  30.0 - 36.0 g/dL   RDW 14.9  11.5 - 15.5 %   Platelets 254  150 - 400 K/uL  BASIC METABOLIC PANEL      Result Value Ref Range   Sodium 136 (*) 137 - 147 mEq/L   Potassium 4.7  3.7 - 5.3 mEq/L   Chloride 93 (*) 96 - 112 mEq/L   CO2 29  19 - 32 mEq/L   Glucose, Bld 159 (*) 70 - 99 mg/dL   BUN 59 (*) 6 -  23 mg/dL   Creatinine, Ser 6.29 (*) 0.50 - 1.35 mg/dL   Calcium 8.9  8.4 - 10.5 mg/dL   GFR calc non Af Amer 8 (*) >90 mL/min   GFR calc Af Amer 9 (*) >90 mL/min  TROPONIN I      Result Value Ref Range   Troponin I <0.30  <0.30 ng/mL     Imaging Review Dg Chest Port 1 View  06/16/2013   CLINICAL DATA:  Chest pain and shortness of breath.  EXAM: PORTABLE CHEST - 1 VIEW  COMPARISON:  06/02/2013  FINDINGS: Prior CABG is again noted. Elevation of the left hemidiaphragm with underlying splenic flexure is unchanged. Linear opacities in the left lung base are unchanged and may reflect scarring and/or atelectasis. Right lung is clear. No definite pleural effusion or pneumothorax is identified. No acute osseous abnormality is seen.  IMPRESSION: Unchanged appearance of the chest without evidence of acute airspace disease.   Electronically Signed   By: Logan Bores   On: 06/16/2013 10:57     EKG Interpretation None      Date: 06/16/2013  Rate: 77  Rhythm: normal sinus rhythm  QRS Axis: normal  Intervals: normal  ST/T Wave abnormalities: nonspecific ST/T changes  Conduction Disutrbances:none  Narrative Interpretation:   Old EKG Reviewed: none available   MDM   Final diagnoses:  None    Patient with persistent chest pain that resolved with aspirin and nitroglycerin here. Now pain free. He started 6 in the morning. Patient is due for dialysis today did not receive dialysis do to chest pain. He was sent  here. EKG without acute changes first troponins negative. Chest x-ray without pneumonia pulmonary edema or pneumothorax. Patient Teryl require admission for rule out patient was seen for similar pain but not as severe on April 15.  Patient primary care Dr. Ailene Ravel as well [place]. Patient Theadore be admitted to telemetry. Hospitalist service.  I personally performed the services described in this documentation, which was scribed in my presence. The recorded information has been reviewed and is accurate.     Alan Kung, MD 06/16/13 [place], MD 06/16/13 [phone]

## 2013-06-16 NOTE — ED Notes (Signed)
Pain 1 after 3rd ntg

## 2013-06-16 NOTE — H&P (Signed)
[facility] History and Physical  EMON FACIANE [id number] DOB: July 21, 1940 DOA: 06/16/2013  Referring physician:  PCP: No PCP Per Patient   Chief Complaint: chest pain  HPI: Alan [name] is a very pleasant 73 y.o. 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 Patient reports he was reporting to dialysis and commented to the staff that he was experiencing intermittent chest pain over the last several weeks. They referred him to the emergency room before dialysis. He indicates pain is located left anterior chest nonradiating but is worse with movement of his left arm. He describes the pain as sharp and each episode lasts several minutes to several hours. He has taken Advil in the past with little relief. Associated symptoms include mild shortness of breath. He denies palpitations headache fever chills nausea vomiting or diaphoresis. He denies abdominal pain diarrhea constipation melena. He does report he makes small amount of urine on occasion. He reports he came to the emergency department about 2 weeks ago for the same. He was given 2 nitroglycerin tablets and felt much better. At that time his EKG was nonacute and his troponin was negative and he wished to be discharged. He states that today chest pain is much relieved after the third nitroglycerin. Initial workup in the emergency department includes a basic metabolic panel significant for sodium of 136 creatinine of 6.29 serum glucose of 159. Complete blood count with a hemoglobin of 11.2, chest x-ray without evidence of acute airspace disease. EKG nonacute. He is afebrile and hemodynamically stable. He is not hypoxic.  In the emergency department he receives 124 mg of aspirin and 0.4 mg of nitroglycerin sublingually x3. We are asked to admit for observation to rule out   Review of  Systems:  10 point review of systems completed and all systems are negative except as indicated in the history of present illness   Past Medical History  Diagnosis Date  . Diabetes mellitus   . Hypertension   . Gout   . Coronary artery disease   . Stroke   . Asthma   . Renal insufficiency   . Hypercholesteremia   . Arthritis   . DDD (degenerative disc disease), lumbar   . DDD (degenerative disc disease), cervical   . Gangrene of toe Feb. 2015    Right  great     Past Surgical History  Procedure Laterality Date  . Coronary artery bypass graft    . Below knee leg amputation Left   . Esophagogastroduodenoscopy Left 05/24/2012    [id number] dilated baggy but otherwise a normal/ Small hiatal hernia. Gastric ulcer -S/P biopsy  . Amputation Right 04/10/2013    Procedure: Right Below Knee Amputation;  Surgeon: Newt Minion, MD;  Location: [facility];  Service: Orthopedics;  Laterality: Right;  Right Below Knee Amputation   Social History:  reports that he has never smoked. He has never used smokeless tobacco. He reports that he does not drink alcohol or use illicit drugs. He is retired he is married and lives with his wife has done so for the last 56 years. He has one child he does not smoke or use illicit drugs. The Monday Wednesday Friday dialysis patient Allergies  Allergen Reactions  . Codeine Itching and Nausea And Vomiting  . Yellow Jacket Venom [Bee Venom] Swelling    Family History  Problem Relation Age of Onset  .  Colon cancer Neg Hx      Prior to Admission medications   Medication Sig Start Date End Date Taking? Authorizing Provider  acetaminophen (TYLENOL) 500 MG tablet Take 500 mg by mouth 2 (two) times daily.   Yes Historical Provider, MD  aspirin EC 81 MG tablet Take 81 mg by mouth at bedtime.    Yes Historical Provider, MD  atorvastatin (LIPITOR) 80 MG tablet Take 80 mg by mouth every evening.   Yes Historical Provider, MD  B Complex-C-Folic Acid (NEPHRO-VITE PO) Take  1 tablet by mouth every evening.    Yes Historical Provider, MD  dextrose 5 % SOLN 50 mL with cefTAZidime 2 G SOLR 2 g Inject 2 g into the vein every Monday, Wednesday, and Friday with hemodialysis. Last dose on 04/26/13. 04/13/13  Yes Orson Eva, MD  fluticasone [facility]) 50 MCG/ACT nasal spray Place 2 sprays into both nostrils daily as needed for allergies.    Yes Historical Provider, MD  furosemide (LASIX) 40 MG tablet Take 20 mg by mouth every evening.   Yes Historical Provider, MD  HYDROcodone-acetaminophen (NORCO/VICODIN) 5-325 MG per tablet Take 1 tablet by mouth every 4 (four) hours as needed. pain   Yes Historical Provider, MD  insulin NPH (HUMULIN N,NOVOLIN N) 100 UNIT/ML injection Inject 10 Units into the skin at bedtime. Use according to sliding scale.   Yes Historical Provider, MD  nitroGLYCERIN (NITROSTAT) 0.4 MG SL tablet Place 0.4 mg under the tongue every 5 (five) minutes as needed for chest pain.    Yes Historical Provider, MD  Nutritional Supplements (FEEDING SUPPLEMENT, NEPRO CARB STEADY,) LIQD Take 237 mLs by mouth as needed (missed meal during dialysis.). 04/13/13  Yes Orson Eva, MD  sevelamer carbonate (RENVELA) 800 MG tablet Take 800-1,600 mg by mouth 3 (three) times daily with meals. Patient takes 2 tablets with meals and 1 tablet with snack   Yes Historical Provider, MD   Physical Exam: Filed Vitals:   06/16/13 1506  BP: 153/61  Pulse: 72  Resp: 14    BP 153/61  Pulse 72  Resp 14  SpO2 100%  General:  Appears calm and comfortable Eyes: PERRL, normal lids, irises & conjunctiva ENT: grossly normal hearing, lips & tongue because membranes of his mouth are moist and pink. He has very poor dentition Neck: no LAD, masses or thyromegaly Cardiovascular: RRR, no m/r/g. Left anterior chest slightly tender to palpation. Bilateral lower extremity amputee Telemetry: SR, no arrhythmias  Respiratory: Normal effort. Breath sounds are diminished particularly on the left. Somewhat  coarse on the right hear no wheeze no crackles Abdomen: soft, ntnd no rebounding no guarding positive bowel sounds throughout Skin: no rash or induration seen on limited exam Musculoskeletal: Dialysis shunt in left arm + thrill. Left below the knee amputation about 10 years ago well healed. Right below the knee amputation February of this year. Incision without erythema swelling or drainage Psychiatric: grossly normal mood and affect, speech fluent and appropriate Neurologic: grossly non-focal. Cranial nerves II through XII grossly intact speech is clear facial symmetry           Labs on Admission:  Basic Metabolic Panel:  Recent Labs Lab 06/16/13 1048  NA 136*  K 4.7  CL 93*  CO2 29  GLUCOSE 159*  BUN 59*  CREATININE 6.29*  CALCIUM 8.9   Liver Function Tests: No results found for this basename: AST, ALT, ALKPHOS, BILITOT, PROT, ALBUMIN,  in the last 168 hours No results found for this basename:  LIPASE, AMYLASE,  in the last 168 hours No results found for this basename: AMMONIA,  in the last 168 hours CBC:  Recent Labs Lab 06/16/13 1048  WBC 8.7  HGB 11.2*  HCT 33.8*  MCV 91.4  PLT 254   Cardiac Enzymes:  Recent Labs Lab 06/16/13 1048  TROPONINI <0.30    BNP (last 3 results)  Recent Labs  10/21/12 1117  PROBNP 875.7*   CBG: No results found for this basename: GLUCAP,  in the last 168 hours  Radiological Exams on Admission: Dg Chest Port 1 View  06/16/2013   CLINICAL DATA:  Chest pain and shortness of breath.  EXAM: PORTABLE CHEST - 1 VIEW  COMPARISON:  06/02/2013  FINDINGS: Prior CABG is again noted. Elevation of the left hemidiaphragm with underlying splenic flexure is unchanged. Linear opacities in the left lung base are unchanged and may reflect scarring and/or atelectasis. Right lung is clear. No definite pleural effusion or pneumothorax is identified. No acute osseous abnormality is seen.  IMPRESSION: Unchanged appearance of the chest without evidence  of acute airspace disease.   Electronically Signed   By: Logan Bores   On: 06/16/2013 10:57    EKG: Independently reviewed EKG with septal infarct noted in September of 2014. When compared to previous EKG no acute change  Assessment/Plan Principal Problem:   Chest pain: At rest in setting of known CAD. Atypical. Rhythm admit to telemetry for observation and rule out. Suspect is more related to musculoskeletal in setting of reflux as well. Pain reproducible with palpation. EKG non acute, chest xray unremarkable and initial troponin negative.  We'll cycle cardiac enzymes. Continue aspirin. Jakaree provide nitroglycerin as needed for any chest pain. Delia repeat EKG in the morning. Also provide GI cocktail and PPI as patient complaining of reflux. Active Problems: Gastric ulcer: History of same. Chart review indicates he had in the ED and April 2014 which revealed gastric ulcer and duodenal bulbar ulcer scar with prominent duodenal nodule. At that time he was placed on PPI twice a day and Carafate. Also some concern at bedtime for gastroparesis and he was put on short dose of Reglan. Clarence continue PPI twice a day.    CAD (coronary artery disease): See #1. I medications include aspirin 81 mg, Lipitor 80 mg. We'll continue Lipitor.   ESRD on dialysis: He is a Monday Wednesday Friday dialysis patient. He missed dialysis today. Mildred request an inpatient nephrology consult.    DM type 2 causing vascular disease: We'll obtain hemoglobin A1c. Wes use sliding scale insulin for optimal glycemic control.    HTN (hypertension), benign: Fair control in the emergency department. I medications include Lasix 20 mg daily. We'll continue this. Monitor    S/P BKA (below knee amputation) unilateral, left and right. Stable at baseline.   Nephrology    Code Status: full Family Communication: wife at bedside Disposition Plan: home hopefully tomorrow  Time spent: 37 minutes  [facility] Pager  [phone]

## 2013-06-16 NOTE — ED Notes (Signed)
Meal tray given, 

## 2013-06-16 NOTE — ED Notes (Signed)
Pt states left-sided chest pain began at 0600, radiating into left arm. Pt went to dialysis and was sent directly to ED. Pt states he was here for the same symptoms a few days ago. Pain is described as "feels like something sitting on my chest." PT was given NTG 0.4mg  SL  En route and pain decreased "a little bit but then came right back up again".  Pain was rated at 5 at the worst, pain is currently rated at 3.

## 2013-06-16 NOTE — H&P (Signed)
Patient seen, independently examined and chart reviewed. I agree with exam, assessment and plan discussed with Dyanne Carrel, NP.  73 year old man seen in the emergency department approximately 2 weeks ago for chest pain which improved with nitroglycerin. He went to dialysis today, 4/29, and reported chest pain at that time and was referred to the emergency department. He reports the pain as left-sided, pressure-like sensation, with pain in the left arm and shoulder. No radiation to neck or jaw. No nausea or vomiting. No shortness of breath. Aggravated by movement of his left arm and shoulder as well as palpation of the left breast. Reports intermittent left breast swelling. No injury or strain by history. Also a significant GI history does not currently on PPI. He does not endorse significant GERD symptoms.  Initial evaluation was fairly unremarkable but because of recurrent chest pain, observation for cardiac rule out was requested. He currently feels much better after having received 3 nitroglycerin in the emergency department.  PMH Diabetes mellitus with peripheral vascular disease, bilateral below the knee amputations. Coronary artery disease, CABG 10 years ago End-stage renal disease on dialysis Monday Wednesday Friday  Objective: Afebrile, vital signs are stable. No hypoxia. Appears calm and comfortable. Speech fluent and clear. Eyes. Pupils equal, round, react to light. Normal lids, irises, conjunctiva. ENT grossly unremarkable. Lips and tongue appear unremarkable. Neck appears grossly unremarkable. Cardiovascular regular rate and rhythm. No murmur, rub or gallop. Respiratory. Clear to auscultation bilaterally. No wheezes, rales or rhonchi. Normal respiratory effort. Abdomen soft nontender nondistended. The left breast is swollen and tender to palpation especially lateral to the sternum to the mid clavicular line. He reports that palpation reproduces pain. He also reports movement of his left arm  and shoulder seems to worsen the pain. He does appear to have limited range of motion of his left shoulder.  Capillary blood sugars stable. Basic metabolic panel consistent with end-stage renal disease. Potassium normal. Troponins negative thus far. Hemoglobin stable 11.2. Chest x-ray no acute disease. EKG independently reviewed showed normal sinus rhythm with no acute changes  Plan observation for chest pain, cardiac rule out. At this time musculoskeletal etiology is suspected based on examination as well as negative evaluation thus far. No features to suggest ACS at this point. Wells = 0. GI symptoms also may be playing a role given his history, if pain recurs could consider outpatient GI followup.  Alan Hodgkins, MD [facility] [phone]

## 2013-06-16 NOTE — ED Notes (Signed)
MD at bedside. 

## 2013-06-17 DIAGNOSIS — E43 Unspecified severe protein-calorie malnutrition: Secondary | ICD-10-CM

## 2013-06-17 LAB — HEMOGLOBIN A1C
HEMOGLOBIN A1C: 6.1 % — AB (ref <5.7)
Mean Plasma Glucose: 128 mg/dL — ABNORMAL HIGH (ref <117)

## 2013-06-17 LAB — TROPONIN I: Troponin I: <0.3 ng/mL (ref <0.30)

## 2013-06-17 LAB — GLUCOSE, CAPILLARY: Glucose-Capillary: 160 mg/dL — ABNORMAL HIGH (ref 70–99)

## 2013-06-17 LAB — HEPATITIS B SURFACE ANTIGEN: Hepatitis B Surface Ag: NEGATIVE

## 2013-06-17 LAB — MRSA PCR SCREENING: MRSA by PCR: NEGATIVE

## 2013-06-17 MED ORDER — HEPARIN SODIUM (PORCINE) 1000 UNIT/ML DIALYSIS
1000.0000 [IU] | INTRAMUSCULAR | Status: DC | PRN
  Filled 2013-06-17: qty 1

## 2013-06-17 MED ORDER — SODIUM CHLORIDE 0.9 % IV SOLN
100.0000 mL | INTRAVENOUS | Status: DC | PRN
Start: 2013-06-17 — End: 2013-06-17

## 2013-06-17 MED ORDER — NEPRO/CARBSTEADY PO LIQD: 237.0000 mL | Freq: Two times a day (BID) | ORAL | Status: DC

## 2013-06-17 MED ORDER — SODIUM CHLORIDE 0.9 % IV SOLN: 100.0000 mL | INTRAVENOUS | Status: DC | PRN

## 2013-06-17 MED ORDER — OMEPRAZOLE 20 MG PO CPDR: 20.0000 mg | DELAYED_RELEASE_CAPSULE | Freq: Two times a day (BID) | ORAL | Status: DC

## 2013-06-17 MED ORDER — LIDOCAINE HCL (PF) 1 % IJ SOLN: 5.0000 mL | INTRAMUSCULAR | Status: DC | PRN

## 2013-06-17 MED ORDER — ALTEPLASE 2 MG IJ SOLR
2.0000 mg | Freq: Once | INTRAMUSCULAR | Status: DC | PRN
  Filled 2013-06-17: qty 2

## 2013-06-17 MED ORDER — PENTAFLUOROPROP-TETRAFLUOROETH EX AERO
1.0000 "application " | INHALATION_SPRAY | CUTANEOUS | Status: DC | PRN
  Filled 2013-06-17: qty 103.5

## 2013-06-17 NOTE — Discharge Summary (Signed)
Patient seen, independently examined and chart reviewed. I agree with exam, assessment and plan discussed with Dyanne Carrel, NP.   Agree with discharge home.  Subjective: Feels well today. No chest pain or shortness of breath. No breast pain. Left shoulder pain today. Tolerate dialysis today.  Objective: Afebrile, vital signs are stable. No hypoxia. Appears calm and comfortable. Speech fluent and clear. Cardiovascular regular rate and rhythm. No murmur, rub or gallop. Respiratory clear to auscultation bilaterally. No wheezes, rales or rhonchi. Normal respiratory effort. Telemetry sinus rhythm.   Capillary blood sugars stable. Troponin is negative.  Atypical chest pain, resolved, likely musculoskeletal by history with aggravation by left shoulder pain and pain with palpation of the left breast which swells at times. No further inpatient evaluation suggested. Outpatient cardiology followup arranged. If symptoms recur, consider outpatient GI evaluation. No signs or symptoms of active ulcer disease at this point. PPI.  Severe malnutrition in context of chronic illness  Alan Hodgkins, MD [facility] [phone]  Summary:  73 year old man seen in the emergency department approximately 2 weeks ago for chest pain which improved with nitroglycerin. He went to dialysis 4/29, and reported chest pain at that time and was referred to the emergency department. He reports the pain as left-sided, pressure-like sensation, with pain in the left arm and shoulder. No radiation to neck or jaw. No nausea or vomiting. No shortness of breath. Aggravated by movement of his left arm and shoulder as well as palpation of the left breast. Reports intermittent left breast swelling. No injury or strain by history. Also a significant GI history  not currently on PPI. He does not endorse significant GERD symptoms.   Initial evaluation was fairly unremarkable but because of recurrent chest pain, observation for cardiac  rule out was requested. Pain free after 3 NTG.  PMH  Diabetes mellitus with peripheral vascular disease, bilateral below the knee amputations.   Coronary artery disease, CABG 10 years ago   End-stage renal disease on dialysis Monday Wednesday Friday

## 2013-06-17 NOTE — Consult Note (Signed)
Reason for Consult: End-stage renal disease Referring Physician: Dr. [name] is an 73 y.o. male.  HPI: He is a patient who has history of coronary artery disease, diabetes, hypertension and peripheral vascular disease presently was sent from dialysis unit because of chest pain. Patient he came yesterday to the unit and he told them he has left-sided chest pain, pressure-like with radiation to his back. It is low patient uses nitroglycerin his pain didn't improve. Because of his multiple risk factors for coronary disease patient is him to the emergency room where he's admitted for observation. Presently patient says that the pain is getting better. During examination he has some tenderness on the left side of from starting from the 2nd to 6th ribs.  Past Medical History  Diagnosis Date  . Diabetes mellitus   . Hypertension   . Gout   . Coronary artery disease   . Stroke   . Asthma   . Renal insufficiency   . Hypercholesteremia   . Arthritis   . DDD (degenerative disc disease), lumbar   . DDD (degenerative disc disease), cervical   . Gangrene of toe Feb. 2015    Right  great    . Gastric ulcer     EGD 2014    Past Surgical History  Procedure Laterality Date  . Coronary artery bypass graft    . Below knee leg amputation Left   . Esophagogastroduodenoscopy Left 05/24/2012    IOE:[id number] dilated baggy but otherwise a normal/ Small hiatal hernia. Gastric ulcer -S/P biopsy  . Amputation Right 04/10/2013    Procedure: Right Below Knee Amputation;  Surgeon: Newt Minion, MD;  Location: [facility];  Service: Orthopedics;  Laterality: Right;  Right Below Knee Amputation    Family History  Problem Relation Age of Onset  . Colon cancer Neg Hx     Social History:  reports that he has never smoked. He has never used smokeless tobacco. He reports that he does not drink alcohol or use illicit drugs.  Allergies:  Allergies  Allergen Reactions  . Codeine Itching and Nausea And  Vomiting  . Yellow Jacket Venom [Bee Venom] Swelling    Medications: I have reviewed the patient's current medications.  Results for orders placed during the hospital encounter of 06/16/13 (from the past 48 hour(s))  CBC     Status: Abnormal   Collection Time    06/16/13 10:48 AM      Result Value Ref Range   WBC 8.7  4.0 - 10.5 K/uL   RBC 3.70 (*) 4.22 - 5.81 MIL/uL   Hemoglobin 11.2 (*) 13.0 - 17.0 g/dL   HCT 33.8 (*) 39.0 - 52.0 %   MCV 91.4  78.0 - 100.0 fL   MCH 30.3  26.0 - 34.0 pg   MCHC 33.1  30.0 - 36.0 g/dL   RDW 14.9  11.5 - 15.5 %   Platelets 254  150 - 400 K/uL  BASIC METABOLIC PANEL     Status: Abnormal   Collection Time    06/16/13 10:48 AM      Result Value Ref Range   Sodium 136 (*) 137 - 147 mEq/L   Potassium 4.7  3.7 - 5.3 mEq/L   Chloride 93 (*) 96 - 112 mEq/L   CO2 29  19 - 32 mEq/L   Glucose, Bld 159 (*) 70 - 99 mg/dL   BUN 59 (*) 6 - 23 mg/dL   Creatinine, Ser 6.29 (*) 0.50 - 1.35 mg/dL  Calcium 8.9  8.4 - 10.5 mg/dL   GFR calc non Af Amer 8 (*) >90 mL/min   GFR calc Af Amer 9 (*) >90 mL/min   Comment: (NOTE)     The eGFR has been calculated using the CKD EPI equation.     This calculation has not been validated in all clinical situations.     eGFR's persistently <90 mL/min signify possible Chronic Kidney     Disease.  TROPONIN I     Status: None   Collection Time    06/16/13 10:48 AM      Result Value Ref Range   Troponin I <0.30  <0.30 ng/mL   Comment:            Due to the release kinetics of cTnI,     a negative result within the first hours     of the onset of symptoms does not rule out     myocardial infarction with certainty.     If myocardial infarction is still suspected,     repeat the test at appropriate intervals.  TROPONIN I     Status: None   Collection Time    06/16/13  7:43 PM      Result Value Ref Range   Troponin I <0.30  <0.30 ng/mL   Comment:            Due to the release kinetics of cTnI,     a negative result  within the first hours     of the onset of symptoms does not rule out     myocardial infarction with certainty.     If myocardial infarction is still suspected,     repeat the test at appropriate intervals.  GLUCOSE, CAPILLARY     Status: Abnormal   Collection Time    06/16/13  8:35 PM      Result Value Ref Range   Glucose-Capillary 254 (*) 70 - 99 mg/dL   Comment 1 Notify RN     Comment 2 Documented in Chart    TROPONIN I     Status: None   Collection Time    06/17/13  1:24 AM      Result Value Ref Range   Troponin I <0.30  <0.30 ng/mL   Comment:            Due to the release kinetics of cTnI,     a negative result within the first hours     of the onset of symptoms does not rule out     myocardial infarction with certainty.     If myocardial infarction is still suspected,     repeat the test at appropriate intervals.  TROPONIN I     Status: None   Collection Time    06/17/13  5:41 AM      Result Value Ref Range   Troponin I <0.30  <0.30 ng/mL   Comment:            Due to the release kinetics of cTnI,     a negative result within the first hours     of the onset of symptoms does not rule out     myocardial infarction with certainty.     If myocardial infarction is still suspected,     repeat the test at appropriate intervals.  GLUCOSE, CAPILLARY     Status: Abnormal   Collection Time    06/17/13  7:31 AM      Result Value Ref  Range   Glucose-Capillary 160 (*) 70 - 99 mg/dL   Comment 1 Notify RN      Dg Chest Port 1 View  06/16/2013   CLINICAL DATA:  Chest pain and shortness of breath.  EXAM: PORTABLE CHEST - 1 VIEW  COMPARISON:  06/02/2013  FINDINGS: Prior CABG is again noted. Elevation of the left hemidiaphragm with underlying splenic flexure is unchanged. Linear opacities in the left lung base are unchanged and may reflect scarring and/or atelectasis. Right lung is clear. No definite pleural effusion or pneumothorax is identified. No acute osseous abnormality is seen.   IMPRESSION: Unchanged appearance of the chest without evidence of acute airspace disease.   Electronically Signed   By: Logan Bores   On: 06/16/2013 10:57    Review of Systems  Constitutional: Negative for chills.  Respiratory: Negative for cough and shortness of breath.   Cardiovascular: Positive for chest pain. Negative for palpitations, orthopnea and leg swelling.  Gastrointestinal: Negative for nausea and vomiting.  Neurological: Positive for weakness.   Blood pressure 162/66, pulse 73, temperature 98 F (36.7 C), temperature source Oral, resp. rate 20, height 0' (0 m), weight 80.74 kg (178 lb), SpO2 97.00%. Physical Exam  Constitutional: He is oriented to person, place, and time. He appears distressed.  Eyes: No scleral icterus.  Neck: No JVD present.  Cardiovascular: Normal rate and regular rhythm.   No murmur heard. Respiratory: He has no wheezes. He has no rales.  GI: He exhibits no distension. There is no tenderness.  Musculoskeletal: He exhibits no edema.  Neurological: He is alert and oriented to person, place, and time.    Assessment/Plan: Problem #1 chest pain: Possibly noncardiac. Presently patient seems to be feeling better. Problem #2 end-stage renal disease: Patient status post hemodialysis on Monday. Patient was not dialyzed yesterday. Patient does not have any uremic sinus symptoms. Problem #3 diabetes Problem #4 coronary artery disease: Status post CABG Problem #5 peripheral vascular disease status post BKA Problem #6 anemia: Secondary to end-stage renal disease and patient on Epogen Problem #7 metabolic bone disease: His calcium is range phosphorus is not available. Plan: We'll make arrangements for patient to get dialysis today We'll do his Epogen 4000 units after each dialysis. We'll use 4K./2.5 calcium bath and we'll try to remove about 2-1/2 L if his blood pressure stable.  Alan [name] 06/17/2013, 7:45 AM

## 2013-06-17 NOTE — Discharge Instructions (Signed)
Chest Pain (Nonspecific) °Chest pain has many causes. Your pain could be caused by something serious, such as a heart attack or a blood clot in the lungs. It could also be caused by something less serious, such as a chest bruise or a virus. Follow up with your doctor. More lab tests or other studies may be needed to find the cause of your pain. Most of the time, nonspecific chest pain Nishant improve within 2 to 3 days of rest and mild pain medicine. °HOME CARE °· For chest bruises, you may put ice on the sore area for 15-20 minutes, 03-04 times a day. Do this only if it makes you feel better. °· Put ice in a plastic bag. °· Place a towel between the skin and the bag. °· Rest for the next 2 to 3 days. °· Go back to work if the pain improves. °· See your doctor if the pain lasts longer than 1 to 2 weeks. °· Only take medicine as told by your doctor. °· Quit smoking if you smoke. °GET HELP RIGHT AWAY IF:  °· There is more pain or pain that spreads to the arm, neck, jaw, back, or belly (abdomen). °· You have shortness of breath. °· You cough more than usual or cough up blood. °· You have very bad back or belly pain, feel sick to your stomach (nauseous), or throw up (vomit). °· You have very bad weakness. °· You pass out (faint). °· You have a fever. °Any of these problems may be serious and may be an emergency. Do not wait to see if the problems Acelin go away. Get medical help right away. Call your local emergency services 911 in [place] Do not drive yourself to the hospital. °MAKE SURE YOU:  °· Understand these instructions. °· Terril watch this condition. °· Advit get help right away if you or your child is not doing well or gets worse. °Document Released: 07/24/2007 Document Revised: 04/29/2011 Document Reviewed: 07/24/2007 °Exit[employer]® Patient Information ©2014 [employer]. ° °

## 2013-06-17 NOTE — Progress Notes (Deleted)
Patient seen, independently examined and chart reviewed. I agree with exam, assessment and plan discussed with Dyanne Carrel, NP.   Agree with discharge home.  Subjective: Feels well today. No chest pain or shortness of breath. No breast pain. Left shoulder pain today. Tolerate dialysis today.  Objective: Afebrile, vital signs are stable. No hypoxia. Appears calm and comfortable. Speech fluent and clear. Cardiovascular regular rate and rhythm. No murmur, rub or gallop. Respiratory clear to auscultation bilaterally. No wheezes, rales or rhonchi. Normal respiratory effort. Telemetry sinus rhythm.   Capillary blood sugars stable. Troponin is negative.  Atypical chest pain, resolved, likely musculoskeletal by history with aggravation by left shoulder pain and pain with palpation of the left breast which swells at times. No further inpatient evaluation suggested. Outpatient cardiology followup arranged. If symptoms recur, consider outpatient GI evaluation. No signs or symptoms of active ulcer disease at this point. PPI.  Severe malnutrition in context of chronic illness  Murray Hodgkins, MD [facility] [phone]  Summary:  73 year old man seen in the emergency department approximately 2 weeks ago for chest pain which improved with nitroglycerin. He went to dialysis 4/29, and reported chest pain at that time and was referred to the emergency department. He reports the pain as left-sided, pressure-like sensation, with pain in the left arm and shoulder. No radiation to neck or jaw. No nausea or vomiting. No shortness of breath. Aggravated by movement of his left arm and shoulder as well as palpation of the left breast. Reports intermittent left breast swelling. No injury or strain by history. Also a significant GI history  not currently on PPI. He does not endorse significant GERD symptoms.   Initial evaluation was fairly unremarkable but because of recurrent chest pain, observation for cardiac  rule out was requested. Pain free after 3 NTG.  PMH  Diabetes mellitus with peripheral vascular disease, bilateral below the knee amputations.  Coronary artery disease, CABG 10 years ago  End-stage renal disease on dialysis Monday Wednesday Friday

## 2013-06-17 NOTE — Discharge Summary (Signed)
Physician Discharge Summary  Alan [name] [id number] DOB: [birth year] DOA: 06/16/2013  PCP: No PCP Per Patient  Admit date: 06/16/2013 Discharge date: 06/17/2013  Time spent: 40 minutes  Recommendations for Outpatient Follow-up:  1. Has appointment with Dr. Jacinta Shoe 06/22/13. Recommend follow up to chest pain   Discharge Diagnoses:  Principal Problem:   Chest pain Active Problems:   CAD (coronary artery disease)   DM type 2 causing vascular disease   HTN (hypertension), benign   S/P BKA (below knee amputation) unilateral, left   ESRD on dialysis   Discharge Condition: stable  Diet recommendation: heart healthy carb modified  Filed Weights   06/16/13 1737 06/17/13 0515  Weight: 81.2 kg (179 lb 0.2 oz) 80.74 kg (178 lb)    History of present illness:  Alan [name] is a very pleasant 73 y.o. male hx of CAD status postCABG 10 years ago, end-stage renal disease on Monday Wednesday Friday dialysis, diabetes, hypertension, presentrf to the emergency department on 06/16/13 chief complaint chest pain. Patient reported he was reporting to dialysis and commented to the staff that he was experiencing intermittent chest pain over the previous weeks. They referred him to the emergency room before dialysis. He indicated pain  located left anterior chest nonradiating but  worse with movement of his left arm. He described the pain as sharp and each episode lasted several minutes to several hours. He had taken Advil in the past with little relief. Associated symptoms included mild shortness of breath. He denied palpitations headache fever chills nausea vomiting or diaphoresis. He denied abdominal pain diarrhea constipation melena. He did report he makes small amount of urine on occasion. He reported he came to the emergency department about 2 weeks prior for the same. He was given 2 nitroglycerin tablets and felt much better. At that time his EKG was nonacute and his troponin was negative and  he wished to be discharged. He stated that today chest pain is much relieved after the third nitroglycerin. Initial workup in the emergency department included a basic metabolic panel significant for sodium of 136 creatinine of 6.29 serum glucose of 159. Complete blood count with a hemoglobin of 11.2, chest x-ray without evidence of acute airspace disease. EKG nonacute. He was afebrile and hemodynamically stable. He was not hypoxic.   Hospital Course:  Chest pain: At rest in setting of known CAD. Atypical. Likely is more related to musculoskeletal in setting of reflux as well. Pain reproducible with palpation. EKG non acute, chest xray unremarkable and  troponin negative x3. No features to suggest ACS. Wells score =0. Pain improved at discharge.  Emeterio continue aspirin and at discharge. Continue PPI at discharge. Has follow up appointment with cardiology 06/22/13.  Active Problems:  Gastric ulcer: History of same. Chart review indicates he had in the ED and April 2014 which revealed gastric ulcer and duodenal bulbar ulcer scar with prominent duodenal nodule. At that time he was placed on PPI twice a day and Carafate. Also some concern at that time for gastroparesis and he was put on short dose of Reglan. Recommend PPI at discharge and follow up with GI OP if pain persists.    CAD (coronary artery disease): See #1. I medications include aspirin 81 mg, Lipitor 80 mg. Continue Lipitor.   ESRD on dialysis: He is a Monday Wednesday Friday dialysis patient. He missed dialysis on day of admission. Evaluated by nephrology  And had dialysis on day of discharge.    DM type 2 causing vascular  disease: Hemoglobin A1c in process at discharge. CBG 160. Recommend close outpatient follow up for optimal control  HTN (hypertension), benign: Fair control.   S/P BKA (below knee amputation) unilateral, left and right. Stable at baseline.   Procedures:  Dialysis 4/230/15  Consultations:  Dr. Hinda Lenis  nephrology  Discharge Exam: Filed Vitals:   06/17/13 0515  BP: 162/66  Pulse: 73  Temp: 98 F (36.7 C)  Resp: 20    General: well nourished appears comfortable Cardiovascular: RRR No MGR  Respiratory: normal effort BS  Slightly coarse but clear bilaterally  Discharge Instructions You were cared for by a hospitalist during your hospital stay. If you have any questions about your discharge medications or the care you received while you were in the hospital after you are discharged, you can call the unit and asked to speak with the hospitalist on call if the hospitalist that took care of you is not available. Once you are discharged, your primary care physician Rafferty handle any further medical issues. Please note that NO REFILLS for any discharge medications Ediberto be authorized once you are discharged, as it is imperative that you return to your primary care physician (or establish a relationship with a primary care physician if you do not have one) for your aftercare needs so that they can reassess your need for medications and monitor your lab values.   Future Appointments Provider Department Dept Phone   06/22/2013 9:00 AM Herminio Commons, MD [facility] [phone]       Medication List         acetaminophen 500 MG tablet  Commonly known as:  TYLENOL  Take 500 mg by mouth 2 (two) times daily.     aspirin EC 81 MG tablet  Take 81 mg by mouth at bedtime.     atorvastatin 80 MG tablet  Commonly known as:  LIPITOR  Take 80 mg by mouth every evening.     dextrose 5 % SOLN 50 mL with cefTAZidime 2 G SOLR 2 g  Inject 2 g into the vein every Monday, Wednesday, and Friday with hemodialysis. Last dose on 04/26/13.     feeding supplement (NEPRO CARB STEADY) Liqd  Take 237 mLs by mouth as needed (missed meal during dialysis.).     fluticasone 50 MCG/ACT nasal spray  Commonly known as:  FLONASE  Place 2 sprays into both nostrils daily as needed for allergies.      furosemide 40 MG tablet  Commonly known as:  LASIX  Take 20 mg by mouth every evening.     HYDROcodone-acetaminophen 5-325 MG per tablet  Commonly known as:  NORCO/VICODIN  Take 1 tablet by mouth every 4 (four) hours as needed. pain     insulin NPH Human 100 UNIT/ML injection  Commonly known as:  HUMULIN N,NOVOLIN N  Inject 10 Units into the skin at bedtime. Use according to sliding scale.     NEPHRO-VITE PO  Take 1 tablet by mouth every evening.     nitroGLYCERIN 0.4 MG SL tablet  Commonly known as:  NITROSTAT  Place 0.4 mg under the tongue every 5 (five) minutes as needed for chest pain.     omeprazole 20 MG capsule  Commonly known as:  PRILOSEC  Take 1 capsule (20 mg total) by mouth 2 (two) times daily before a meal.     sevelamer carbonate 800 MG tablet  Commonly known as:  RENVELA  Take 800-1,600 mg by mouth 3 (three) times daily with meals.  Patient takes 2 tablets with meals and 1 tablet with snack       Allergies  Allergen Reactions  . Codeine Itching and Nausea And Vomiting  . Yellow Jacket Venom [Bee Venom] Swelling       Follow-up Information   Follow up with Herminio Commons, MD On 06/22/2013. (appointment at [facility])    Specialty:  Cardiology   Contact information:   [address] [place] [address] [phone]        The results of significant diagnostics from this hospitalization (including imaging, microbiology, ancillary and laboratory) are listed below for reference.    Significant Diagnostic Studies: Dg Chest Port 1 View  06/16/2013   CLINICAL DATA:  Chest pain and shortness of breath.  EXAM: PORTABLE CHEST - 1 VIEW  COMPARISON:  06/02/2013  FINDINGS: Prior CABG is again noted. Elevation of the left hemidiaphragm with underlying splenic flexure is unchanged. Linear opacities in the left lung base are unchanged and may reflect scarring and/or atelectasis. Right lung is clear. No definite pleural effusion or pneumothorax is identified. No acute  osseous abnormality is seen.  IMPRESSION: Unchanged appearance of the chest without evidence of acute airspace disease.   Electronically Signed   By: Logan Bores   On: 06/16/2013 10:57   Dg Chest Port 1 View  06/02/2013   CLINICAL DATA:  Chest pain  EXAM: PORTABLE CHEST - 1 VIEW  COMPARISON:  February 12, 2013  FINDINGS: There is scarring in the left lower lobe. There is no edema or consolidation. Heart is mildly prominent with normal pulmonary vascularity, stable. Patient is status post coronary artery bypass grafting. Scoliosis is again noted.  IMPRESSION: Scarring left base.  Scoliosis.  No edema or consolidation.   Electronically Signed   By: Lowella Grip M.D.   On: 06/02/2013 11:11    Microbiology: Recent Results (from the past 240 hour(s))  MRSA PCR SCREENING     Status: None   Collection Time    06/17/13  6:17 AM      Result Value Ref Range Status   MRSA by PCR NEGATIVE  NEGATIVE Final   Comment:            The GeneXpert MRSA Assay (FDA     approved for NASAL specimens     only), is one component of a     comprehensive MRSA colonization     surveillance program. It is not     intended to diagnose MRSA     infection nor to guide or     monitor treatment for     MRSA infections.     Labs: Basic Metabolic Panel:  Recent Labs Lab 06/16/13 1048  NA 136*  K 4.7  CL 93*  CO2 29  GLUCOSE 159*  BUN 59*  CREATININE 6.29*  CALCIUM 8.9   Liver Function Tests: No results found for this basename: AST, ALT, ALKPHOS, BILITOT, PROT, ALBUMIN,  in the last 168 hours No results found for this basename: LIPASE, AMYLASE,  in the last 168 hours No results found for this basename: AMMONIA,  in the last 168 hours CBC:  Recent Labs Lab 06/16/13 1048  WBC 8.7  HGB 11.2*  HCT 33.8*  MCV 91.4  PLT 254   Cardiac Enzymes:  Recent Labs Lab 06/16/13 1048 06/16/13 1943 06/17/13 0124 06/17/13 0541  TROPONINI <0.30 <0.30 <0.30 <0.30   BNP: BNP (last 3 results)  Recent  Labs  10/21/12 1117  PROBNP 875.7*   CBG:  Recent Labs Lab 06/16/13 2035 06/17/13 0731  GLUCAP 254* 160*       Signed:  Lezlie Octave Quinnetta Roepke  [facility] 06/17/2013, 10:26 AM

## 2013-06-17 NOTE — Procedures (Signed)
   HEMODIALYSIS TREATMENT NOTE:  3.5 hour heparin-free dialysis completed via left forearm AVF (15g antegrade).  Goal met:  Tolerated removal of 2.1 liters without interruption in ultrafiltration.  All blood was reinfused and hemostasis was achieved within 10 minutes.  Report given to Verdell Face, RN.  Rockwell Alexandria, RN, CDN

## 2013-06-17 NOTE — Progress Notes (Signed)
INITIAL NUTRITION ASSESSMENT  DOCUMENTATION CODES Per approved criteria  -Severe malnutrition in the context of chronic illness   Pt meets criteria for severe MALNUTRITION in the context of chronic illness as evidenced by severe muscle depletion, <75% estimated energy intake x 1 month.  INTERVENTION: Nepro Shake po BID, each supplement provides 425 kcal and 19 grams protein Renal multivitamin daily  NUTRITION DIAGNOSIS: Inadequate oral intake related to decreased appetite as evidenced by moderate to severe muscle and fat depletion.   Goal: [name] meet >90% of estimated nutritional needs  Monitor:  PO intake, labs, skin assessments, weight changes, I/O's  Reason for Assessment: MST=2  73 y.o. male  Admitting Dx: Chest pain  ASSESSMENT: Pt is a very pleasant gentleman who was admitted with chest pain. 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  Pt on HD and receives Monday, Wednesday, and Friday. He reports his appetite is "light" at baseline and has not seen much improvement since admission. Noted pt ate approximately 75% of breakfast tray, eating all except 1/2 oatmeal and piece of toast. He drinks Nepro at home and at dialysis, but admits that he does not do this routinely. He reports a hx of weight loss, but attributes at least some of the weight loss to BKA.  Wt hx reveals a 6.8% wt loss x 1 year. Noted that pt has gained 18# (11.2%) in approximately 2 months s/p BKA. He denies chewing or swallowing difficulties despite top 4 missing teeth, reporting "it's been like this for a long time".   Nutrition Focused Physical Exam:  Subcutaneous Fat:  Orbital Region: moderate depletion Upper Arm Region: moderate depletion Thoracic and Lumbar Region: WDL  Muscle:  Temple Region: WDL Clavicle Bone Region: severe depletion Clavicle and Acromion Bone Region: moderate depletion Scapular Bone Region: severe depletion Dorsal Hand: severe depletion Patellar Region:  unable to assess due to bandages Anterior Thigh Region: unable to assess due to bangades Posterior Calf Region: unable to assess due to BKA  Edema: none present  Height: Ht Readings from Last 1 Encounters:  04/20/13 5\' 9"  (1.753 m)    Weight: Wt Readings from Last 1 Encounters:  06/17/13 178 lb (80.74 kg)    Ideal Body Weight: 139#  % Ideal Body Weight: 128%  Wt Readings from Last 10 Encounters:  06/17/13 178 lb (80.74 kg)  04/20/13 160 lb (72.576 kg)  04/14/13 166 lb 10.7 oz (75.6 kg)  04/14/13 166 lb 10.7 oz (75.6 kg)  01/26/13 159 lb (72.122 kg)  05/25/12 191 lb 2.2 oz (86.7 kg)  05/25/12 191 lb 2.2 oz (86.7 kg)  11/04/11 190 lb (86.183 kg)    Usual Body Weight: 190#  % Usual Body Weight: 94%  BMI:  29.66. Meets criteria for overweight.   Estimated Nutritional Needs: Kcal: 2310-2395 daily Protein: 92-116 grams daily Fluid: 1.0-1.5 L daily  Skin: Intact  Diet Order: Carb modified/ Heart Healthy  EDUCATION NEEDS: -Education needs addressed   Intake/Output Summary (Last 24 hours) at 06/17/13 0919 Last data filed at 06/17/13 [id number]  Gross per 24 hour  Intake    243 ml  Output    350 ml  Net   -107 ml    Last BM: 06/15/13  Labs:   Recent Labs Lab 06/16/13 1048  NA 136*  K 4.7  CL 93*  CO2 29  BUN 59*  CREATININE 6.29*  CALCIUM 8.9  GLUCOSE 159*    CBG (last 3)   Recent Labs  06/16/13  2035 06/17/13 0731  GLUCAP 254* 160*    Scheduled Meds: . aspirin EC  325 mg Oral Daily  . atorvastatin  80 mg Oral QPM  . furosemide  20 mg Oral QPM  . heparin  5,000 Units Subcutaneous 3 times per day  . insulin aspart  0-15 Units Subcutaneous TID WC  . insulin aspart  0-5 Units Subcutaneous QHS  . multivitamin  1 tablet Oral QHS  . pantoprazole  40 mg Oral BID AC  . sevelamer carbonate  1,600 mg Oral TID WC  . sodium chloride  3 mL Intravenous Q12H  . sodium chloride  3 mL Intravenous Q12H    Continuous Infusions:   Past Medical History   Diagnosis Date  . Diabetes mellitus   . Hypertension   . Gout   . Coronary artery disease   . Stroke   . Asthma   . Renal insufficiency   . Hypercholesteremia   . Arthritis   . DDD (degenerative disc disease), lumbar   . DDD (degenerative disc disease), cervical   . Gangrene of toe Feb. 2015    Right  great    . Gastric ulcer     EGD 2014    Past Surgical History  Procedure Laterality Date  . Coronary artery bypass graft    . Below knee leg amputation Left   . Esophagogastroduodenoscopy Left 05/24/2012    [id number] dilated baggy but otherwise a normal/ Small hiatal hernia. Gastric ulcer -S/P biopsy  . Amputation Right 04/10/2013    Procedure: Right Below Knee Amputation;  Surgeon: Newt Minion, MD;  Location: [facility];  Service: Orthopedics;  Laterality: Right;  Right Below Knee Amputation    Brandis Wixted A. Jimmye Norman, RD, LDN Pager: [phone]

## 2013-06-17 NOTE — Progress Notes (Signed)
06/17/13 1616 Reviewed discharge instructions with patient, wife at bedside. Given copy of AVS, prescription, f/u appointment as scheduled. Reviewed chest pain education sheet, when to seek medical attention. Verbalized understanding. No c/o pain at this time. IV site d/c'd per nurse tech, within normal limits. Pt left floor in stable condition via w/c accompanied by nurse tech. Donavan Foil, RN

## 2013-06-22 ENCOUNTER — Encounter: Payer: Self-pay | Admitting: Cardiovascular Disease

## 2013-06-22 ENCOUNTER — Encounter: Payer: Medicare Other | Admitting: Cardiovascular Disease

## 2013-07-27 ENCOUNTER — Encounter: Payer: Medicare Other | Admitting: Cardiovascular Disease

## 2013-08-24 ENCOUNTER — Emergency Department (HOSPITAL_COMMUNITY)
Admission: EM | Admit: 2013-08-24 | Discharge: 2013-08-24 | Disposition: A | Discharge status: Home / Self Care | Payer: Medicare Other | Attending: Emergency Medicine | Admitting: Emergency Medicine

## 2013-08-24 ENCOUNTER — Encounter (HOSPITAL_COMMUNITY): Payer: Self-pay | Admitting: Emergency Medicine

## 2013-08-24 DIAGNOSIS — Z951 Presence of aortocoronary bypass graft: Secondary | ICD-10-CM | POA: Insufficient documentation

## 2013-08-24 DIAGNOSIS — Z8673 Personal history of transient ischemic attack (TIA), and cerebral infarction without residual deficits: Secondary | ICD-10-CM | POA: Insufficient documentation

## 2013-08-24 DIAGNOSIS — J45909 Unspecified asthma, uncomplicated: Secondary | ICD-10-CM | POA: Insufficient documentation

## 2013-08-24 DIAGNOSIS — Z7982 Long term (current) use of aspirin: Secondary | ICD-10-CM | POA: Insufficient documentation

## 2013-08-24 DIAGNOSIS — M7989 Other specified soft tissue disorders: Secondary | ICD-10-CM | POA: Insufficient documentation

## 2013-08-24 DIAGNOSIS — Z79899 Other long term (current) drug therapy: Secondary | ICD-10-CM | POA: Insufficient documentation

## 2013-08-24 DIAGNOSIS — E7800 Pure hypercholesterolemia, unspecified: Secondary | ICD-10-CM | POA: Insufficient documentation

## 2013-08-24 DIAGNOSIS — M129 Arthropathy, unspecified: Secondary | ICD-10-CM | POA: Insufficient documentation

## 2013-08-24 DIAGNOSIS — Z87448 Personal history of other diseases of urinary system: Secondary | ICD-10-CM | POA: Insufficient documentation

## 2013-08-24 DIAGNOSIS — M79632 Pain in left forearm: Secondary | ICD-10-CM

## 2013-08-24 DIAGNOSIS — E119 Type 2 diabetes mellitus without complications: Secondary | ICD-10-CM | POA: Insufficient documentation

## 2013-08-24 DIAGNOSIS — Z794 Long term (current) use of insulin: Secondary | ICD-10-CM | POA: Insufficient documentation

## 2013-08-24 DIAGNOSIS — I2510 Atherosclerotic heart disease of native coronary artery without angina pectoris: Secondary | ICD-10-CM | POA: Insufficient documentation

## 2013-08-24 DIAGNOSIS — M79609 Pain in unspecified limb: Secondary | ICD-10-CM | POA: Insufficient documentation

## 2013-08-24 DIAGNOSIS — IMO0002 Reserved for concepts with insufficient information to code with codable children: Secondary | ICD-10-CM | POA: Insufficient documentation

## 2013-08-24 DIAGNOSIS — I10 Essential (primary) hypertension: Secondary | ICD-10-CM | POA: Insufficient documentation

## 2013-08-24 DIAGNOSIS — K259 Gastric ulcer, unspecified as acute or chronic, without hemorrhage or perforation: Secondary | ICD-10-CM | POA: Insufficient documentation

## 2013-08-24 NOTE — ED Notes (Signed)
Reports having dialysis yesterday but graft in left arm is clogged and now pt having pain to arm.

## 2013-08-24 NOTE — ED Notes (Signed)
Dr. Venora Maples back in with the pt.

## 2013-08-24 NOTE — ED Notes (Signed)
Pt states he went to dialysis and they tried to access his graft but it would not work. The pt was not able to receive dialysis yesterday d/t this. The last time was Friday.

## 2013-08-24 NOTE — ED Notes (Signed)
Pt placed on 5 lead at this time.

## 2013-08-24 NOTE — ED Provider Notes (Signed)
CSN: [id number]     Arrival date & time 08/24/13  [id number] History   First MD Initiated Contact with Patient 08/24/13 1014     Chief Complaint  Patient presents with  . Vascular Access Problem      HPI Patient presents to the emergency department after developing swelling in his left forearm during dialysis yesterday.  He states his had no fevers or chills or redness to his left forearm fistula.  He states initially they inserted a dialysis machine needle into his left fistula initially there was good blood flow within the machine alarmed.  Surely after they tried to into the fistula several more times and patient developed acute swelling proximal and dialysis was discontinued.  The patient continues to feel a good thrill in his left fistula.  He denies numbness or weakness of his left hand.  He has no other complaints acutely.  He does however mention that he has had some swelling or redness of his left index finger over the past several weeks that does not appear to be improving but is not necessarily worsening either.   Past Medical History  Diagnosis Date  . Diabetes mellitus   . Hypertension   . Gout   . Coronary artery disease   . Stroke   . Asthma   . Renal insufficiency   . Hypercholesteremia   . Arthritis   . DDD (degenerative disc disease), lumbar   . DDD (degenerative disc disease), cervical   . Gangrene of toe Feb. 2015    Right  great    . Gastric ulcer     EGD 2014   Past Surgical History  Procedure Laterality Date  . Coronary artery bypass graft    . Below knee leg amputation Left   . Esophagogastroduodenoscopy Left 05/24/2012    [id number] dilated baggy but otherwise a normal/ Small hiatal hernia. Gastric ulcer -S/P biopsy  . Amputation Right 04/10/2013    Procedure: Right Below Knee Amputation;  Surgeon: Newt Minion, MD;  Location: [facility];  Service: Orthopedics;  Laterality: Right;  Right Below Knee Amputation   Family History  Problem Relation Age of Onset  .  Colon cancer Neg Hx    History  Substance Use Topics  . Smoking status: Never Smoker   . Smokeless tobacco: Never Used  . Alcohol Use: No    Review of Systems  All other systems reviewed and are negative.     Allergies  Codeine and Yellow jacket venom  Home Medications   Prior to Admission medications   Medication Sig Start Date End Date Taking? Authorizing Provider  acetaminophen (TYLENOL) 500 MG tablet Take 500 mg by mouth 2 (two) times daily.   Yes Historical Provider, MD  aspirin EC 81 MG tablet Take 81 mg by mouth at bedtime.    Yes Historical Provider, MD  atorvastatin (LIPITOR) 80 MG tablet Take 80 mg by mouth every evening.   Yes Historical Provider, MD  B Complex-C-Folic Acid (NEPHRO-VITE PO) Take 1 tablet by mouth every evening.    Yes Historical Provider, MD  fluticasone (FLONASE) 50 MCG/ACT nasal spray Place 2 sprays into both nostrils daily as needed for allergies.    Yes Historical Provider, MD  furosemide (LASIX) 40 MG tablet Take 20 mg by mouth every evening.   Yes Historical Provider, MD  insulin glargine (LANTUS) 100 UNIT/ML injection Inject 8 Units into the skin at bedtime.   Yes Historical Provider, MD  lactose free nutrition (BOOST) LIQD Take 237  mLs by mouth daily.   Yes Historical Provider, MD  nitroGLYCERIN (NITROSTAT) 0.4 MG SL tablet Place 0.4 mg under the tongue every 5 (five) minutes as needed for chest pain.    Yes Historical Provider, MD  omeprazole (PRILOSEC) 20 MG capsule Take 1 capsule (20 mg total) by mouth 2 (two) times daily before a meal. 06/17/13  Yes Radene Gunning, NP  sevelamer carbonate (RENVELA) 800 MG tablet Take 800-1,600 mg by mouth 3 (three) times daily with meals. Patient takes 2 tablets with meals and 1 tablet with snack   Yes Historical Provider, MD  Whey Protein POWD Take by mouth 3 (three) times daily. About 1 tablespoonful on food with every meal   Yes Historical Provider, MD  dextrose 5 % SOLN 50 mL with cefTAZidime 2 G SOLR 2 g  Inject 2 g into the vein every Monday, Wednesday, and Friday with hemodialysis. Last dose on 04/26/13. 04/13/13   Orson Eva, MD   BP 185/70  Pulse 67  Temp(Src) 97.6 F (36.4 C) (Oral)  Resp 18  SpO2 100% Physical Exam  Nursing note and vitals reviewed. Constitutional: He is oriented to person, place, and time. He appears well-developed and well-nourished.  HENT:  Head: Normocephalic.  Eyes: EOM are normal.  Neck: Normal range of motion.  Pulmonary/Chest: Effort normal.  Abdominal: He exhibits no distension.  Musculoskeletal: Normal range of motion.  AV fistula in left anterior forearm with some swelling proximal to the fistula.  There is a strong thrill in the fistula.  There is a palpable left radial pulse.  He does have mild swelling and redness of his proximal phalanx of his left index finger.  There is no obvious fluctuance.  There is swelling in this region.  No redness over the fistula.  No obvious fluctuance surrounding the fistula.  No erythema over the swelling proximally.  Full range of motion of left elbow.  Neurological: He is alert and oriented to person, place, and time.  Psychiatric: He has a normal mood and affect.    ED Course  Procedures (including critical care time) Labs Review Labs Reviewed - No data to display  Imaging Review No results found.   EKG Interpretation None      MDM   Final diagnoses:  Pain and swelling of left forearm   Patient with some mild swelling proximal to his left AV fistula.  No overt signs of infection at this time.  I am concerned about possible infection left hand.  Given the swelling pain around his left forearm and left AV fistula I scheduled the patient vascular surgeon in today.  I've asked that the patient discuss the redness of his left hand with his vascular surgeon.  He may benefit from antibiotics and hand surgery followup.  I've asked that he discuss this with his vascular surgeon.  I do not believe anything needs to be  done emergently about his left hand and that feels though he would benefit more from urgent evaluation by his vascular surgeon which Damonie be completed several hours in the physician's office    Hoy Morn, MD 08/25/13 1624

## 2013-08-25 ENCOUNTER — Ambulatory Visit: Payer: Self-pay | Admitting: Vascular Surgery

## 2013-09-03 ENCOUNTER — Encounter: Payer: Self-pay | Admitting: Cardiovascular Disease

## 2013-09-03 ENCOUNTER — Encounter: Payer: Medicare Other | Admitting: Cardiovascular Disease

## 2013-09-28 ENCOUNTER — Ambulatory Visit: Payer: Self-pay | Admitting: Vascular Surgery

## 2013-10-18 ENCOUNTER — Encounter: Payer: Self-pay | Admitting: *Deleted

## 2013-12-07 ENCOUNTER — Ambulatory Visit: Payer: Self-pay | Admitting: Vascular Surgery

## 2014-01-14 ENCOUNTER — Emergency Department (HOSPITAL_COMMUNITY): Payer: Medicare Other

## 2014-01-14 ENCOUNTER — Encounter (HOSPITAL_COMMUNITY): Payer: Self-pay | Admitting: *Deleted

## 2014-01-14 ENCOUNTER — Observation Stay (HOSPITAL_COMMUNITY)
Admission: EM | Admit: 2014-01-14 | Discharge: 2014-01-15 | Disposition: A | Discharge status: Home / Self Care | Payer: Medicare Other | Attending: Family Medicine | Admitting: Family Medicine

## 2014-01-14 DIAGNOSIS — M1990 Unspecified osteoarthritis, unspecified site: Secondary | ICD-10-CM | POA: Insufficient documentation

## 2014-01-14 DIAGNOSIS — Z8673 Personal history of transient ischemic attack (TIA), and cerebral infarction without residual deficits: Secondary | ICD-10-CM | POA: Diagnosis not present

## 2014-01-14 DIAGNOSIS — Z992 Dependence on renal dialysis: Secondary | ICD-10-CM

## 2014-01-14 DIAGNOSIS — Z7951 Long term (current) use of inhaled steroids: Secondary | ICD-10-CM | POA: Insufficient documentation

## 2014-01-14 DIAGNOSIS — Z79899 Other long term (current) drug therapy: Secondary | ICD-10-CM | POA: Diagnosis not present

## 2014-01-14 DIAGNOSIS — Z951 Presence of aortocoronary bypass graft: Secondary | ICD-10-CM | POA: Insufficient documentation

## 2014-01-14 DIAGNOSIS — I120 Hypertensive chronic kidney disease with stage 5 chronic kidney disease or end stage renal disease: Secondary | ICD-10-CM | POA: Diagnosis not present

## 2014-01-14 DIAGNOSIS — M109 Gout, unspecified: Secondary | ICD-10-CM | POA: Insufficient documentation

## 2014-01-14 DIAGNOSIS — E119 Type 2 diabetes mellitus without complications: Secondary | ICD-10-CM | POA: Insufficient documentation

## 2014-01-14 DIAGNOSIS — N186 End stage renal disease: Secondary | ICD-10-CM | POA: Insufficient documentation

## 2014-01-14 DIAGNOSIS — E782 Mixed hyperlipidemia: Secondary | ICD-10-CM | POA: Insufficient documentation

## 2014-01-14 DIAGNOSIS — I2510 Atherosclerotic heart disease of native coronary artery without angina pectoris: Secondary | ICD-10-CM | POA: Diagnosis not present

## 2014-01-14 DIAGNOSIS — R079 Chest pain, unspecified: Secondary | ICD-10-CM | POA: Diagnosis not present

## 2014-01-14 DIAGNOSIS — E1159 Type 2 diabetes mellitus with other circulatory complications: Secondary | ICD-10-CM | POA: Diagnosis present

## 2014-01-14 DIAGNOSIS — I10 Essential (primary) hypertension: Secondary | ICD-10-CM | POA: Diagnosis present

## 2014-01-14 DIAGNOSIS — K259 Gastric ulcer, unspecified as acute or chronic, without hemorrhage or perforation: Secondary | ICD-10-CM | POA: Insufficient documentation

## 2014-01-14 LAB — BASIC METABOLIC PANEL
Anion gap: 14 (ref 5–15)
BUN: 59 mg/dL — ABNORMAL HIGH (ref 6–23)
CHLORIDE: 92 meq/L — AB (ref 96–112)
CO2: 27 mEq/L (ref 19–32)
Calcium: 8.8 mg/dL (ref 8.4–10.5)
Creatinine, Ser: 4.23 mg/dL — ABNORMAL HIGH (ref 0.50–1.35)
GFR calc non Af Amer: 13 mL/min — ABNORMAL LOW (ref >90)
GFR, EST AFRICAN AMERICAN: 14 mL/min — AB (ref >90)
Glucose, Bld: 239 mg/dL — ABNORMAL HIGH (ref 70–99)
Potassium: 3.6 mEq/L — ABNORMAL LOW (ref 3.7–5.3)
Sodium: 133 mEq/L — ABNORMAL LOW (ref 137–147)

## 2014-01-14 LAB — GLUCOSE, CAPILLARY
GLUCOSE-CAPILLARY: 153 mg/dL — AB (ref 70–99)
GLUCOSE-CAPILLARY: 158 mg/dL — AB (ref 70–99)

## 2014-01-14 LAB — CBC
HEMATOCRIT: 32.4 % — AB (ref 39.0–52.0)
HEMOGLOBIN: 10.7 g/dL — AB (ref 13.0–17.0)
MCH: 32 pg (ref 26.0–34.0)
MCHC: 33 g/dL (ref 30.0–36.0)
MCV: 97 fL (ref 78.0–100.0)
Platelets: 203 10*3/uL (ref 150–400)
RBC: 3.34 MIL/uL — ABNORMAL LOW (ref 4.22–5.81)
RDW: 14 % (ref 11.5–15.5)
WBC: 8.8 10*3/uL (ref 4.0–10.5)

## 2014-01-14 LAB — TROPONIN I: Troponin I: <0.3 ng/mL (ref <0.30)

## 2014-01-14 MED ORDER — SODIUM CHLORIDE 0.9 % IV SOLN: 250.0000 mL | INTRAVENOUS | Status: DC | PRN

## 2014-01-14 MED ORDER — DOCUSATE SODIUM 100 MG PO CAPS: 100.0000 mg | ORAL_CAPSULE | Freq: Every day | ORAL | Status: DC | PRN

## 2014-01-14 MED ORDER — SEVELAMER CARBONATE 800 MG PO TABS: 800.0000 mg | ORAL_TABLET | Freq: Three times a day (TID) | ORAL | Status: DC

## 2014-01-14 MED ORDER — NITROGLYCERIN 0.4 MG SL SUBL
0.4000 mg | SUBLINGUAL_TABLET | SUBLINGUAL | Status: DC | PRN
  Administered 2014-01-14: 0.4 mg via SUBLINGUAL
  Filled 2014-01-14: qty 1

## 2014-01-14 MED ORDER — FUROSEMIDE 20 MG PO TABS
20.0000 mg | ORAL_TABLET | Freq: Every evening | ORAL | Status: DC
Start: 2014-01-14 — End: 2014-01-15
  Administered 2014-01-14: 20 mg via ORAL
  Filled 2014-01-14: qty 1

## 2014-01-14 MED ORDER — SODIUM CHLORIDE 0.9 % IJ SOLN
3.0000 mL | Freq: Two times a day (BID) | INTRAMUSCULAR | Status: DC
  Administered 2014-01-14 – 2014-01-15 (×2): 3 mL via INTRAVENOUS

## 2014-01-14 MED ORDER — ACETAMINOPHEN 325 MG PO TABS: 650.0000 mg | ORAL_TABLET | Freq: Four times a day (QID) | ORAL | Status: DC | PRN

## 2014-01-14 MED ORDER — ASPIRIN 81 MG PO CHEW
324.0000 mg | CHEWABLE_TABLET | Freq: Once | ORAL | Status: AC
  Administered 2014-01-14: 324 mg via ORAL

## 2014-01-14 MED ORDER — INSULIN ASPART 100 UNIT/ML ~~LOC~~ SOLN
0.0000 [IU] | Freq: Three times a day (TID) | SUBCUTANEOUS | Status: DC
  Administered 2014-01-14: 2 [IU] via SUBCUTANEOUS

## 2014-01-14 MED ORDER — SEVELAMER CARBONATE 800 MG PO TABS: 800.0000 mg | ORAL_TABLET | ORAL | Status: DC | PRN

## 2014-01-14 MED ORDER — NITROGLYCERIN 0.4 MG SL SUBL: 0.4000 mg | SUBLINGUAL_TABLET | SUBLINGUAL | Status: DC | PRN

## 2014-01-14 MED ORDER — ACETAMINOPHEN 650 MG RE SUPP: 650.0000 mg | Freq: Four times a day (QID) | RECTAL | Status: DC | PRN

## 2014-01-14 MED ORDER — SODIUM CHLORIDE 0.9 % IJ SOLN: 3.0000 mL | INTRAMUSCULAR | Status: DC | PRN

## 2014-01-14 MED ORDER — ONDANSETRON HCL 4 MG/2ML IJ SOLN: 4.0000 mg | Freq: Four times a day (QID) | INTRAMUSCULAR | Status: DC | PRN

## 2014-01-14 MED ORDER — COLCHICINE 0.6 MG PO TABS
0.6000 mg | ORAL_TABLET | Freq: Every day | ORAL | Status: DC
  Administered 2014-01-14 – 2014-01-15 (×2): 0.6 mg via ORAL
  Filled 2014-01-14 (×2): qty 1

## 2014-01-14 MED ORDER — PANTOPRAZOLE SODIUM 40 MG PO TBEC
40.0000 mg | DELAYED_RELEASE_TABLET | Freq: Every day | ORAL | Status: DC
  Administered 2014-01-14 – 2014-01-15 (×2): 40 mg via ORAL
  Filled 2014-01-14 (×2): qty 1

## 2014-01-14 MED ORDER — VITAMIN C 500 MG PO TABS
500.0000 mg | ORAL_TABLET | Freq: Every day | ORAL | Status: DC
  Administered 2014-01-14 – 2014-01-15 (×2): 500 mg via ORAL
  Filled 2014-01-14 (×2): qty 1

## 2014-01-14 MED ORDER — ONDANSETRON HCL 4 MG PO TABS: 4.0000 mg | ORAL_TABLET | Freq: Four times a day (QID) | ORAL | Status: DC | PRN

## 2014-01-14 MED ORDER — BOOST HIGH PROTEIN PO LIQD
237.0000 mL | Freq: Every day | ORAL | Status: DC
  Administered 2014-01-14 – 2014-01-15 (×2): 237 mL via ORAL
  Filled 2014-01-14 (×4): qty 237

## 2014-01-14 MED ORDER — HEPARIN SODIUM (PORCINE) 5000 UNIT/ML IJ SOLN
5000.0000 [IU] | Freq: Three times a day (TID) | INTRAMUSCULAR | Status: DC
  Administered 2014-01-14 (×2): 5000 [IU] via SUBCUTANEOUS
  Filled 2014-01-14 (×3): qty 1

## 2014-01-14 MED ORDER — ASPIRIN 81 MG PO CHEW
CHEWABLE_TABLET | ORAL | Status: AC
  Administered 2014-01-14: 324 mg
  Filled 2014-01-14: qty 4

## 2014-01-14 MED ORDER — ASPIRIN 325 MG PO TABS: 325.0000 mg | ORAL_TABLET | ORAL | Status: DC

## 2014-01-14 MED ORDER — MORPHINE SULFATE 2 MG/ML IJ SOLN: 1.0000 mg | INTRAMUSCULAR | Status: DC | PRN

## 2014-01-14 MED ORDER — SEVELAMER CARBONATE 800 MG PO TABS
1600.0000 mg | ORAL_TABLET | Freq: Three times a day (TID) | ORAL | Status: DC
  Administered 2014-01-14 – 2014-01-15 (×2): 1600 mg via ORAL
  Filled 2014-01-14 (×3): qty 2

## 2014-01-14 MED ORDER — ACETAMINOPHEN 500 MG PO TABS
500.0000 mg | ORAL_TABLET | Freq: Two times a day (BID) | ORAL | Status: DC
  Administered 2014-01-14 – 2014-01-15 (×2): 500 mg via ORAL
  Filled 2014-01-14 (×2): qty 1

## 2014-01-14 MED ORDER — FLUTICASONE PROPIONATE 50 MCG/ACT NA SUSP: 2.0000 | Freq: Every day | NASAL | Status: DC | PRN

## 2014-01-14 MED ORDER — INSULIN GLARGINE 100 UNIT/ML ~~LOC~~ SOLN
6.0000 [IU] | Freq: Every day | SUBCUTANEOUS | Status: DC
  Administered 2014-01-14: 6 [IU] via SUBCUTANEOUS
  Filled 2014-01-14 (×3): qty 0.06

## 2014-01-14 MED ORDER — SODIUM CHLORIDE 0.9 % IJ SOLN
3.0000 mL | Freq: Two times a day (BID) | INTRAMUSCULAR | Status: DC
  Administered 2014-01-15: 3 mL via INTRAVENOUS

## 2014-01-14 NOTE — ED Notes (Signed)
Nurse to call back for report.  

## 2014-01-14 NOTE — ED Provider Notes (Signed)
CSN: [id number]     Arrival date & time 01/14/14  1237 History   First MD Initiated Contact with Patient 01/14/14 1306     Chief Complaint  Patient presents with  . Chest Pain     HPI Pt was seen at 1310. Per EMS and pt, c/o gradual onset and persistence of constant mid-sternal chest "pain" that began at HD approximately 1/2 hour PTA. Pt states he was having his usual HD treatment when he developed mid-sternal chest "heaviness" and SOB. EMS gave SL ntg x2 and applied O2 N/C with partial relief. Denies palpitations, no cough, no abd pain, no N/V/D, no back pain.    Past Medical History  Diagnosis Date  . Diabetes mellitus   . Hypertension   . Gout   . Coronary artery disease   . Stroke   . Asthma   . Renal insufficiency   . Hypercholesteremia   . Arthritis   . DDD (degenerative disc disease), lumbar   . DDD (degenerative disc disease), cervical   . Gangrene of toe Feb. 2015    Right  great    . Gastric ulcer     EGD 2014   Past Surgical History  Procedure Laterality Date  . Coronary artery bypass graft    . Below knee leg amputation Left   . Esophagogastroduodenoscopy Left 05/24/2012    [id number] dilated baggy but otherwise a normal/ Small hiatal hernia. Gastric ulcer -S/P biopsy  . Amputation Right 04/10/2013    Procedure: Right Below Knee Amputation;  Surgeon: Newt Minion, MD;  Location: [facility];  Service: Orthopedics;  Laterality: Right;  Right Below Knee Amputation  . Appendectomy     Family History  Problem Relation Age of Onset  . Colon cancer Neg Hx    History  Substance Use Topics  . Smoking status: Never Smoker   . Smokeless tobacco: Never Used  . Alcohol Use: No    Review of Systems ROS: Statement: All systems negative except as marked or noted in the HPI; Constitutional: Negative for fever and chills. ; ; Eyes: Negative for eye pain, redness and discharge. ; ; ENMT: Negative for ear pain, hoarseness, nasal congestion, sinus pressure and sore throat. ; ;  Cardiovascular: +CP, SOB. Negative for palpitations, diaphoresis, and peripheral edema. ; ; Respiratory: Negative for cough, wheezing and stridor. ; ; Gastrointestinal: Negative for nausea, vomiting, diarrhea, abdominal pain, blood in stool, hematemesis, jaundice and rectal bleeding. . ; ; Genitourinary: Negative for dysuria, flank pain and hematuria. ; ; Musculoskeletal: Negative for back pain and neck pain. Negative for swelling and trauma.; ; Skin: Negative for pruritus, rash, abrasions, blisters, bruising and skin lesion.; ; Neuro: Negative for headache, lightheadedness and neck stiffness. Negative for weakness, altered level of consciousness , altered mental status, extremity weakness, paresthesias, involuntary movement, seizure and syncope.     Allergies  Codeine and Yellow jacket venom  Home Medications   Prior to Admission medications   Medication Sig Start Date End Date Taking? Authorizing Provider  acetaminophen (TYLENOL) 500 MG tablet Take 500 mg by mouth 2 (two) times daily.   Yes Historical Provider, MD  aspirin EC 81 MG tablet Take 81 mg by mouth at bedtime.    Yes Historical Provider, MD  B Complex-C-Folic Acid (NEPHRO-VITE PO) Take 1 tablet by mouth every evening.    Yes Historical Provider, MD  colchicine 0.6 MG tablet Take 0.6 mg by mouth daily.   Yes Historical Provider, MD  docusate sodium (COLACE) 100 MG  capsule Take 100 mg by mouth daily as needed for mild constipation.   Yes Historical Provider, MD  fluticasone (FLONASE) 50 MCG/ACT nasal spray Place 2 sprays into both nostrils daily as needed for allergies.    Yes Historical Provider, MD  furosemide (LASIX) 40 MG tablet Take 20 mg by mouth every evening.   Yes Historical Provider, MD  lactose free nutrition (BOOST) LIQD Take 237 mLs by mouth daily.   Yes Historical Provider, MD  LANTUS SOLOSTAR 100 UNIT/ML Solostar Pen Inject 6 Units into the skin at bedtime. 12/21/13  Yes Historical Provider, MD  nitroGLYCERIN (NITROSTAT)  0.4 MG SL tablet Place 0.4 mg under the tongue every 5 (five) minutes as needed for chest pain.    Yes Historical Provider, MD  omeprazole (PRILOSEC) 20 MG capsule Take 1 capsule (20 mg total) by mouth 2 (two) times daily before a meal. 06/17/13  Yes Radene Gunning, NP  sevelamer carbonate (RENVELA) 800 MG tablet Take 800-1,600 mg by mouth 3 (three) times daily with meals. Patient takes 2 tablets with meals and 1 tablet with snack   Yes Historical Provider, MD  vitamin C (ASCORBIC ACID) 500 MG tablet Take 500 mg by mouth daily.   Yes Historical Provider, MD   BP 150/64 mmHg  Pulse 72  Temp(Src) 97.6 F (36.4 C)  Resp 20  SpO2 100% Physical Exam  1315; Physical examination:  Nursing notes reviewed; Vital signs and O2 SAT reviewed;  Constitutional: Well developed, Well nourished, Well hydrated, In no acute distress; Head:  Normocephalic, atraumatic; Eyes: EOMI, PERRL, No scleral icterus; ENMT: Mouth and pharynx normal, Mucous membranes moist; Neck: Supple, Full range of motion, No lymphadenopathy; Cardiovascular: Regular rate and rhythm, No gallop; Respiratory: Breath sounds clear & equal bilaterally, No rales, rhonchi, wheezes.  Speaking full sentences with ease, Normal respiratory effort/excursion; Chest: Nontender, Well healed surgical scar over sternum. Movement normal; Abdomen: Soft, Nontender, Nondistended, Normal bowel sounds; Genitourinary: No CVA tenderness; Extremities: Pulses normal, No tenderness, No edema. Bilat LE's BKA..; Neuro: AA&Ox3, vague historian. Major CN grossly intact.  Speech clear. No gross focal motor or sensory deficits in extremities.; Skin: Color normal, Warm, Dry.   ED Course  Procedures     EKG Interpretation   Date/Time:  Friday January 14 2014 12:47:08 EST Ventricular Rate:  72 PR Interval:  181 QRS Duration: 85 QT Interval:  405 QTC Calculation: 443 R Axis:   -7 Text Interpretation:  Sinus rhythm Left axis deviation Nonspecific T wave  abnormality When  compared with ECG of 06/16/2013 Nonspecific T wave  abnormality is more pronounced Confirmed by [facility]  MD, Nunzio Cory [phone])  on 01/14/2014 1:33:56 PM      MDM  MDM Reviewed: previous chart, nursing note and vitals Reviewed previous: labs and ECG Interpretation: labs, ECG and x-ray     Results for orders placed or performed during the hospital encounter of 01/14/14  CBC  Result Value Ref Range   WBC 8.8 4.0 - 10.5 K/uL   RBC 3.34 (L) 4.22 - 5.81 MIL/uL   Hemoglobin 10.7 (L) 13.0 - 17.0 g/dL   HCT 32.4 (L) 39.0 - 52.0 %   MCV 97.0 78.0 - 100.0 fL   MCH 32.0 26.0 - 34.0 pg   MCHC 33.0 30.0 - 36.0 g/dL   RDW 14.0 11.5 - 15.5 %   Platelets 203 150 - 400 K/uL  Basic metabolic panel  Result Value Ref Range   Sodium 133 (L) 137 - 147 mEq/L   Potassium  3.6 (L) 3.7 - 5.3 mEq/L   Chloride 92 (L) 96 - 112 mEq/L   CO2 27 19 - 32 mEq/L   Glucose, Bld 239 (H) 70 - 99 mg/dL   BUN 59 (H) 6 - 23 mg/dL   Creatinine, Ser 4.23 (H) 0.50 - 1.35 mg/dL   Calcium 8.8 8.4 - 10.5 mg/dL   GFR calc non Af Amer 13 (L) >90 mL/min   GFR calc Af Amer 14 (L) >90 mL/min   Anion gap 14 5 - 15  Troponin I  Result Value Ref Range   Troponin I <0.30 <0.30 ng/mL   Dg Chest Port 1 View 01/14/2014   CLINICAL DATA:  Chest pain for 1 hr  EXAM: PORTABLE CHEST - 1 VIEW  COMPARISON:  06/16/2013  FINDINGS: Cardiac shadow is stable. Postoperative changes are again seen. Mild scarring is again noted in the left base. No acute bony abnormality is noted. Chronic changes about both shoulder joints are noted.  IMPRESSION: No acute abnormality seen.   Electronically Signed   By: Inez Catalina M.D.   On: 01/14/2014 13:42    1445:  ASA and SL ntg given while in the ED with improvement in his symptoms. Multiple risk factors for ACS; Derek obs admit.  T/C to [facility] Dr. Wendee Beavers, case discussed, including:  HPI, pertinent PM/SHx, VS/PE, dx testing, ED course and treatment:  Agreeable to admit, requests to write temporary orders,  obtain observation tele bed to team APAdmits.   Francine Graven, DO 01/17/14 1531

## 2014-01-14 NOTE — H&P (Signed)
[facility] History and Physical  [name] [id number] DOB: 09-19-1940 DOA: 01/14/2014  Referring physician: Dr. Thurnell Garbe PCP: PROVIDER NOT IN SYSTEM   Chief Complaint: Chest discomfort while on dialysis  HPI: Alan [name] is a 73 y.o. male  With history of diabetes mellitus, hypertension, end-stage renal disease on dialysis Monday, Wednesday, Friday. Presents to the ED after developing chest discomfort while on dialysis. The discomfort lasted 20 minutes. He describes it as center of his chest with no radiation. Also described as an ache. He reports a putting pressure on his chest does reproduce the chest discomfort. He denies any fevers, chills, cough, or sick contacts. He was at rest when the pain occurred. Currently has resolved.  We were consulted for further evaluation recommendations regarding patient's chest discomfort.   Review of Systems:  Constitutional:  No weight loss, night sweats, Fevers, chills, fatigue.  HEENT:  No headaches, Difficulty swallowing,Tooth/dental problems,Sore throat,  No sneezing, itching, ear ache, nasal congestion, post nasal drip,  Cardio-vascular:  No chest pain (not currently), Orthopnea, PND, swelling in lower extremities, anasarca, dizziness, palpitations  GI:  No heartburn, indigestion, abdominal pain, nausea, vomiting, diarrhea, change in bowel habits, loss of appetite  Resp:  No shortness of breath with exertion or at rest. No excess mucus, no productive cough, No non-productive cough, No coughing up of blood.No change in color of mucus.No wheezing.No chest wall deformity  Skin:  no rash or lesions.  GU:  no dysuria, change in color of urine, no urgency or frequency. No flank pain.  Musculoskeletal:  No joint pain or swelling. No decreased range of motion. No back pain.  Psych:  No change in mood or affect. No depression or anxiety. No memory loss.   Past Medical History  Diagnosis Date  . Diabetes mellitus   . Hypertension    . Gout   . Coronary artery disease   . Stroke   . Asthma   . Renal insufficiency   . Hypercholesteremia   . Arthritis   . DDD (degenerative disc disease), lumbar   . DDD (degenerative disc disease), cervical   . Gangrene of toe Feb. 2015    Right  great    . Gastric ulcer     EGD 2014   Past Surgical History  Procedure Laterality Date  . Coronary artery bypass graft    . Below knee leg amputation Left   . Esophagogastroduodenoscopy Left 05/24/2012    [id number] dilated baggy but otherwise a normal/ Small hiatal hernia. Gastric ulcer -S/P biopsy  . Amputation Right 04/10/2013    Procedure: Right Below Knee Amputation;  Surgeon: Newt Minion, MD;  Location: [facility];  Service: Orthopedics;  Laterality: Right;  Right Below Knee Amputation  . Appendectomy     Social History:  reports that he has never smoked. He has never used smokeless tobacco. He reports that he does not drink alcohol or use illicit drugs.  Allergies  Allergen Reactions  . Codeine Itching and Nausea And Vomiting  . Yellow Jacket Venom [Bee Venom] Swelling    Family History  Problem Relation Age of Onset  . Colon cancer Neg Hx      Prior to Admission medications   Medication Sig Start Date End Date Taking? Authorizing Provider  acetaminophen (TYLENOL) 500 MG tablet Take 500 mg by mouth 2 (two) times daily.   Yes Historical Provider, MD  aspirin EC 81 MG tablet Take 81 mg by mouth at bedtime.    Yes Historical Provider, MD  B Complex-C-Folic Acid (NEPHRO-VITE PO) Take 1 tablet by mouth every evening.    Yes Historical Provider, MD  colchicine 0.6 MG tablet Take 0.6 mg by mouth daily.   Yes Historical Provider, MD  docusate sodium (COLACE) 100 MG capsule Take 100 mg by mouth daily as needed for mild constipation.   Yes Historical Provider, MD  fluticasone (FLONASE) 50 MCG/ACT nasal spray Place 2 sprays into both nostrils daily as needed for allergies.    Yes Historical Provider, MD  furosemide (LASIX) 40 MG  tablet Take 20 mg by mouth every evening.   Yes Historical Provider, MD  lactose free nutrition (BOOST) LIQD Take 237 mLs by mouth daily.   Yes Historical Provider, MD  LANTUS SOLOSTAR 100 UNIT/ML Solostar Pen Inject 6 Units into the skin at bedtime. 12/21/13  Yes Historical Provider, MD  nitroGLYCERIN (NITROSTAT) 0.4 MG SL tablet Place 0.4 mg under the tongue every 5 (five) minutes as needed for chest pain.    Yes Historical Provider, MD  omeprazole (PRILOSEC) 20 MG capsule Take 1 capsule (20 mg total) by mouth 2 (two) times daily before a meal. 06/17/13  Yes Radene Gunning, NP  sevelamer carbonate (RENVELA) 800 MG tablet Take 800-1,600 mg by mouth 3 (three) times daily with meals. Patient takes 2 tablets with meals and 1 tablet with snack   Yes Historical Provider, MD  vitamin C (ASCORBIC ACID) 500 MG tablet Take 500 mg by mouth daily.   Yes Historical Provider, MD   Physical Exam: Filed Vitals:   01/14/14 1300 01/14/14 1400 01/14/14 1430 01/14/14 1500  BP: 143/63 152/75 171/67 153/72  Pulse: 72  72 70  Temp:      Resp: 18 13 15 13   SpO2: 100%  100% 99%    Wt Readings from Last 3 Encounters:  06/17/13 78.2 kg (172 lb 6.4 oz)  04/20/13 72.576 kg (160 lb)  04/14/13 75.6 kg (166 lb 10.7 oz)    General:  Appears calm and comfortable Eyes: PERRL, normal lids, irises & conjunctiva ENT: grossly normal hearing, lips & tongue Neck: no LAD, masses or thyromegaly Cardiovascular: RRR, no m/r/g. No LE edema. Respiratory: CTA bilaterally, no w/r/r. Normal respiratory effort. Abdomen: soft, nt, nd Skin: no rash or induration seen on limited exam Musculoskeletal: grossly normal tone BUE Psychiatric: grossly normal mood and affect, speech fluent and appropriate Neurologic: grossly non-focal.          Labs on Admission:  Basic Metabolic Panel:  Recent Labs Lab 01/14/14 1300  NA 133*  K 3.6*  CL 92*  CO2 27  GLUCOSE 239*  BUN 59*  CREATININE 4.23*  CALCIUM 8.8   Liver Function  Tests: No results for input(s): AST, ALT, ALKPHOS, BILITOT, PROT, ALBUMIN in the last 168 hours. No results for input(s): LIPASE, AMYLASE in the last 168 hours. No results for input(s): AMMONIA in the last 168 hours. CBC:  Recent Labs Lab 01/14/14 1300  WBC 8.8  HGB 10.7*  HCT 32.4*  MCV 97.0  PLT 203   Cardiac Enzymes:  Recent Labs Lab 01/14/14 1300  TROPONINI <0.30    BNP (last 3 results) No results for input(s): PROBNP in the last 8760 hours. CBG: No results for input(s): GLUCAP in the last 168 hours.  Radiological Exams on Admission: Dg Chest Port 1 View  01/14/2014   CLINICAL DATA:  Chest pain for 1 hr  EXAM: PORTABLE CHEST - 1 VIEW  COMPARISON:  06/16/2013  FINDINGS: Cardiac shadow is stable. Postoperative changes  are again seen. Mild scarring is again noted in the left base. No acute bony abnormality is noted. Chronic changes about both shoulder joints are noted.  IMPRESSION: No acute abnormality seen.   Electronically Signed   By: Inez Catalina M.D.   On: 01/14/2014 13:42    EKG: Independently reviewed. Sinus rhythm with no ST elevations or depressions  Assessment/Plan   Chest pain -Troponins every 6 hours 3 -Telemetry monitoring - We'll monitor for the next 23 hours. Should chest pain resolved and troponins being negative we'll plan on discharging neck same. - First cardiac enzyme negative and EKG with no ST elevations or depressions.  Active Problems:   CAD (coronary artery disease) -Patient was on aspirin at home. We'll continue aspirin 325 mg daily while ruling out.    DM type 2 causing vascular disease -We'll continue home Lantus dose, place on sliding scale insulin sensitive scale    HTN (hypertension), benign -Continue home regimen    ESRD on dialysis -Patient gets dialyzed on Monday Wednesdays and Fridays   Code Status:full DVT Prophylaxis: Heparin Family Communication: discussed with spouse Disposition Plan: Observation in telemetry  Time  spent: > 45 minutes  Velvet Bathe [facility] Pager [phone]

## 2014-01-14 NOTE — ED Notes (Addendum)
Patient starting having central chest pain during dialysis; patient given nitro x 2 at dialysis without relief; patient with history of CABG

## 2014-01-15 DIAGNOSIS — E1151 Type 2 diabetes mellitus with diabetic peripheral angiopathy without gangrene: Secondary | ICD-10-CM

## 2014-01-15 LAB — CBC
HCT: 31.1 % — ABNORMAL LOW (ref 39.0–52.0)
HEMOGLOBIN: 10.2 g/dL — AB (ref 13.0–17.0)
MCH: 31.3 pg (ref 26.0–34.0)
MCHC: 32.8 g/dL (ref 30.0–36.0)
MCV: 95.4 fL (ref 78.0–100.0)
PLATELETS: 195 10*3/uL (ref 150–400)
RBC: 3.26 MIL/uL — ABNORMAL LOW (ref 4.22–5.81)
RDW: 13.9 % (ref 11.5–15.5)
WBC: 5.8 10*3/uL (ref 4.0–10.5)

## 2014-01-15 LAB — BASIC METABOLIC PANEL
Anion gap: 14 (ref 5–15)
BUN: 76 mg/dL — ABNORMAL HIGH (ref 6–23)
CHLORIDE: 96 meq/L (ref 96–112)
CO2: 27 mEq/L (ref 19–32)
CREATININE: 5.65 mg/dL — AB (ref 0.50–1.35)
Calcium: 8.6 mg/dL (ref 8.4–10.5)
GFR, EST AFRICAN AMERICAN: 10 mL/min — AB (ref >90)
GFR, EST NON AFRICAN AMERICAN: 9 mL/min — AB (ref >90)
Glucose, Bld: 115 mg/dL — ABNORMAL HIGH (ref 70–99)
Potassium: 4 mEq/L (ref 3.7–5.3)
Sodium: 137 mEq/L (ref 137–147)

## 2014-01-15 LAB — GLUCOSE, CAPILLARY
Glucose-Capillary: 116 mg/dL — ABNORMAL HIGH (ref 70–99)
Glucose-Capillary: 200 mg/dL — ABNORMAL HIGH (ref 70–99)

## 2014-01-15 LAB — TROPONIN I: Troponin I: <0.3 ng/mL (ref <0.30)

## 2014-01-15 NOTE — Discharge Summary (Signed)
Physician Discharge Summary  Alan [name] [id number] DOB: [birth year] DOA: 01/14/2014  PCP: PROVIDER NOT IN SYSTEM  Admit date: 01/14/2014 Discharge date: 01/15/2014  Time spent: > 35 minutes  Recommendations for Outpatient Follow-up:  Please consider further work up for chest discomfort as outpatient  Discharge Diagnoses:  Active Problems:   CAD (coronary artery disease)   DM type 2 causing vascular disease   HTN (hypertension), benign   ESRD on dialysis   Chest pain   Discharge Condition: stable  Diet recommendation: heart healthy/diabetic diet  Filed Weights   01/14/14 1814  Weight: 78.2 kg (172 lb 6.4 oz)    History of present illness:  73 y.o. male  With history of diabetes mellitus, hypertension, end-stage renal disease on dialysis Monday, Wednesday, Friday. Presents to the ED after developing chest discomfort while on dialysis.   Hospital Course:  Chest discomfort - resolved on day of discharge - troponins negative x 3 - No red flags reported to me while patient was on telemetry monitoring  For chronic medical conditions listed below we Alexis continue patient's home medical regimen on discharge  Procedures:  None  Consultations:  None  Discharge Exam: Filed Vitals:   01/15/14 0510  BP: 123/54  Pulse: 66  Temp: 98 F (36.7 C)  Resp: 20    General: Pt in nad, alert and awake Cardiovascular: rrr, no mrg Respiratory: cta bl, no wheezes  Discharge Instructions You were cared for by a hospitalist during your hospital stay. If you have any questions about your discharge medications or the care you received while you were in the hospital after you are discharged, you can call the unit and asked to speak with the hospitalist on call if the hospitalist that took care of you is not available. Once you are discharged, your primary care physician Keifer handle any further medical issues. Please note that NO REFILLS for any discharge medications Atilano be  authorized once you are discharged, as it is imperative that you return to your primary care physician (or establish a relationship with a primary care physician if you do not have one) for your aftercare needs so that they can reassess your need for medications and monitor your lab values.  Discharge Instructions    Call MD for:  severe uncontrolled pain    Complete by:  As directed      Call MD for:  temperature >100.4    Complete by:  As directed      Diet - low sodium heart healthy    Complete by:  As directed      Discharge instructions    Complete by:  As directed   Please be sure to follow up with your nephrologist and you primary care physician after discharge.     Increase activity slowly    Complete by:  As directed           Current Discharge Medication List    CONTINUE these medications which have NOT CHANGED   Details  acetaminophen (TYLENOL) 500 MG tablet Take 500 mg by mouth 2 (two) times daily.    aspirin EC 81 MG tablet Take 81 mg by mouth at bedtime.     B Complex-C-Folic Acid (NEPHRO-VITE PO) Take 1 tablet by mouth every evening.     colchicine 0.6 MG tablet Take 0.6 mg by mouth daily.    docusate sodium (COLACE) 100 MG capsule Take 100 mg by mouth daily as needed for mild constipation.    fluticasone (FLONASE) 50  MCG/ACT nasal spray Place 2 sprays into both nostrils daily as needed for allergies.     furosemide (LASIX) 40 MG tablet Take 20 mg by mouth every evening.    lactose free nutrition (BOOST) LIQD Take 237 mLs by mouth daily.    LANTUS SOLOSTAR 100 UNIT/ML Solostar Pen Inject 6 Units into the skin at bedtime.    nitroGLYCERIN (NITROSTAT) 0.4 MG SL tablet Place 0.4 mg under the tongue every 5 (five) minutes as needed for chest pain.     omeprazole (PRILOSEC) 20 MG capsule Take 1 capsule (20 mg total) by mouth 2 (two) times daily before a meal.    sevelamer carbonate (RENVELA) 800 MG tablet Take 800-1,600 mg by mouth 3 (three) times daily with  meals. Patient takes 2 tablets with meals and 1 tablet with snack    vitamin C (ASCORBIC ACID) 500 MG tablet Take 500 mg by mouth daily.       Allergies  Allergen Reactions  . Codeine Itching and Nausea And Vomiting  . Yellow Jacket Venom [Bee Venom] Swelling      The results of significant diagnostics from this hospitalization (including imaging, microbiology, ancillary and laboratory) are listed below for reference.    Significant Diagnostic Studies: Dg Chest Port 1 View  01/14/2014   CLINICAL DATA:  Chest pain for 1 hr  EXAM: PORTABLE CHEST - 1 VIEW  COMPARISON:  06/16/2013  FINDINGS: Cardiac shadow is stable. Postoperative changes are again seen. Mild scarring is again noted in the left base. No acute bony abnormality is noted. Chronic changes about both shoulder joints are noted.  IMPRESSION: No acute abnormality seen.   Electronically Signed   By: Inez Catalina M.D.   On: 01/14/2014 13:42    Microbiology: No results found for this or any previous visit (from the past 240 hour(s)).   Labs: Basic Metabolic Panel:  Recent Labs Lab 01/14/14 1300 01/15/14 0639  NA 133* 137  K 3.6* 4.0  CL 92* 96  CO2 27 27  GLUCOSE 239* 115*  BUN 59* 76*  CREATININE 4.23* 5.65*  CALCIUM 8.8 8.6   Liver Function Tests: No results for input(s): AST, ALT, ALKPHOS, BILITOT, PROT, ALBUMIN in the last 168 hours. No results for input(s): LIPASE, AMYLASE in the last 168 hours. No results for input(s): AMMONIA in the last 168 hours. CBC:  Recent Labs Lab 01/14/14 1300 01/15/14 0639  WBC 8.8 5.8  HGB 10.7* 10.2*  HCT 32.4* 31.1*  MCV 97.0 95.4  PLT 203 195   Cardiac Enzymes:  Recent Labs Lab 01/14/14 1300 01/14/14 1832 01/15/14 0133 01/15/14 0639  TROPONINI <0.30 <0.30 <0.30 <0.30   BNP: BNP (last 3 results) No results for input(s): PROBNP in the last 8760 hours. CBG:  Recent Labs Lab 01/14/14 1618 01/14/14 2023 01/15/14 0735  GLUCAP 153* 158* 116*        Signed:  Velvet Bathe  [facility] 01/15/2014, 10:38 AM

## 2014-01-15 NOTE — Progress Notes (Signed)
UR completed 

## 2014-04-21 ENCOUNTER — Ambulatory Visit: Payer: Self-pay | Admitting: Vascular Surgery

## 2014-06-10 NOTE — Op Note (Signed)
PATIENT NAME:  JAMOR, AJAYI A MR#:  [id number] DATE OF BIRTH:  December 20, 1940  DATE OF PROCEDURE:  09/03/2012  PREOPERATIVE DIAGNOSES:  1. End-stage renal disease.  2. Poorly maturing left arm arteriovenous fistula.  3. Stroke.  4. Hypertension.   POSTOPERATIVE DIAGNOSES:  1. End-stage renal disease.  2. Poorly maturing left arm arteriovenous fistula.  3. Stroke.  4. Hypertension.   PROCEDURES:  1. Ultrasound guidance for vascular access, left radiocephalic arteriovenous fistula in a retrograde fashion within the cephalic vein.  2. Left upper extremity fistulogram, central venogram.  3. Percutaneous transluminal angioplasty to peri-anastomotic stenosis, left radiocephalic arteriovenous fistula.   SURGEON: Algernon Huxley, MD  ANESTHESIA: Local with moderate conscious sedation.   ESTIMATED BLOOD LOSS: 25 mL.  FLUOROSCOPY TIME: 2.1 minutes.   CONTRAST USED: 25 mL.   INDICATION FOR PROCEDURE: A 74 year old African-American male who had a left radiocephalic AV fistula placed several weeks ago. It is not maturing well, and noninvasive study showed peri-anastomotic stenosis. The intervention is planned to help maturation of the AV fistula to make it a usable fistula. Risks and benefits were discussed. Informed consent was obtained.   DESCRIPTION OF THE PROCEDURE: The patient is brought to the vascular and interventional radiology suite. Left upper extremity is sterilely prepped and draped, and a sterile surgical field was created. The cephalic vein was accessed in the proximal forearm under direct ultrasound guidance without difficulty with a micropuncture needle. The micropuncture wire and sheath were placed, and we upsized to a 6 [place] sheath. A Kumpe catheter was placed at the anastomosis. This showed irregularity at and immediately beyond the anastomosis, and approximately 3 to 4 cm beyond the anastomosis was a 75% to 80% stenosis. The remainder of the fistula was patent on imaging, and it had  dual outflow through both the cephalic vein and the basilic vein in the upper arm. I was able to cross the lesion without difficulty with a Magic Torque wire. The patient was given 3000 units of intravenous heparin. A 5 mm diameter angioplasty balloon was inflated directly at the anastomosis as well as at the stenosis 3 to 4 cm beyond the anastomosis. The stenosis immediately at the anastomosis was significantly improved with the 5 mm balloon, but the stenosis 3 to 4 cm beyond in the cephalic vein was not. I upsized to a 7 mm diameter angioplasty balloon and took care to stay out of the radial artery and treated this area with a high-pressure 7 mm diameter angioplasty balloon. Following this, there was significant improvement and only approximately 20% residual stenosis. At this point, I elected to terminate the procedure. The sheath was removed around a 4-0 Monocryl pursestring suture. Pressure was held. Sterile dressing was placed. The patient tolerated the procedure well and was taken to the recovery room in stable condition.   ____________________________ Algernon Huxley, MD jsd:OSi D: 09/03/2012 11:34:29 ET T: 09/03/2012 11:42:43 ET JOB#: [id number]  cc: Algernon Huxley, MD, <Dictator> Algernon Huxley MD ELECTRONICALLY SIGNED 09/07/2012 13:51

## 2014-06-10 NOTE — Op Note (Signed)
PATIENT NAME:  Alan [name], Alan [name] MR#:  [id number] DATE OF BIRTH:  Jul 22, 1940  DATE OF PROCEDURE:  11/26/2012  DATE OF PROCEDURE: 11/26/2012.   PREOPERATIVE DIAGNOSES: 1. End-stage renal disease.  2. Poorly functional left arm arteriovenous fistula.  3. History of stroke.  4. Diabetes.  5. Hypertension.   POSTOPERATIVE DIAGNOSES:  1. End-stage renal disease.  2. Poorly functional left arm arteriovenous fistula.  3. History of stroke.  4. Diabetes.  5. Hypertension.   PROCEDURES: 1. Ultrasound guidance for vascular access to left radiocephalic AV fistula.  2. Left upper extremity fistulogram and central venogram.  3. Percutaneous transluminal angioplasty of perianastomotic stenosis with 5 mm and 6 mm diameter angioplasty balloon.   SURGEON: Leotis Pain, M.D.   ANESTHESIA: Local with moderate conscious sedation.   ESTIMATED BLOOD LOSS: 25 mL.   FLUOROSCOPY TIME: Approximately two minutes.   CONTRAST USED: 30 mL.   INDICATION FOR PROCEDURE: This is [name] gentleman who had [name] fistula placed earlier this year. This is only been used [name] handful of times for dialysis and has been difficult to cannulate. He has [name] perianastomotic stenosis on noninvasive study and we are planning to treat this to improve his fistula function.   DESCRIPTION OF PROCEDURE: The patient is brought to the vascular interventional radiology suite. The left upper extremity was sterilely prepped and draped and [name] sterile surgical field was created. The fistula was accessed just below the antecubital fossa in retrograde fashion. Under direct ultrasound guidance with [name] micropuncture needle and [name] permanent image was recorded. Micropuncture wire and sheath were then placed and we upsized to [name] 6 [place] sheath. [name] Kumpe catheter was placed just into the radial artery and there was [name] stenosis that was occlusive with the catheter in place in the perianastomotic region. The remainder of the fistula with injections through the sheath showed  good flow with the majority of the outflow in the upper arm through the basilic venous system, central venous circulation was patent. I placed [name] Magic torque wire. I treated the perianastomotic lesion with [name] 5 mm diameter angioplasty balloon with suboptimal result and greater than 50% residual stenosis. I upsized to [name] 6 mm diameter angioplasty balloon. Following this, there was significantly improved flow with less than 30% residual stenosis. At this point, I elected to terminate the procedure. The sheath was removed around [name] 4-0 Monocryl pursestring suture. Pressure was held. Sterile dressing was placed. The patient tolerated the procedure well and was taken to the recovery room in stable condition.     ____________________________ Algernon Huxley, MD jsd:sg D: 11/26/2012 12:05:04 ET T: 11/26/2012 12:11:29 ET JOB#: [id number]  cc: Algernon Huxley, MD, <Dictator> Algernon Huxley MD ELECTRONICALLY SIGNED 12/09/2012 10:22

## 2014-06-10 NOTE — Op Note (Signed)
PATIENT NAME:  Alan [name], Alan [name] MR#:  [id number] DATE OF BIRTH:  1940-04-17  DATE OF PROCEDURE:  07/09/2012  PREOPERATIVE DIAGNOSES: 1.  End-stage renal disease.  2.  Hyperlipidemia.  3.  Hypertension.   POSTOPERATIVE DIAGNOSES: 1.  End-stage renal disease.  2.  Hyperlipidemia.  3.  Hypertension.  PROCEDURE:  Left radiocephalic AV fistula creation.   SURGEON:  Algernon Huxley, M.D.   ANESTHESIA:  General.   ESTIMATED BLOOD LOSS:  Minimal.   INDICATION FOR PROCEDURE:  This is [name] 74 year old gentleman who has transitioned to end-stage renal disease.  He now needs permanent dialysis access.  Fistula creation is recommended based on preoperative vein mapping and arterial studies.   DESCRIPTION OF PROCEDURE:  The patient is brought to the operative suite and after adequate level of general anesthesia is obtained the left upper extremity is sterilely prepped and draped and [name] sterile surgical field was created.  An incision between the visible cephalic vein and the palpable radial artery in the forearm was created.  This was made [name] little more proximal than typical due to [name] soft tissue mass that he says has been present for some time just above the wrist.  The cephalic vein was dissected out, marked for orientation.  [name] medium-sized medial branch was ligated and divided between silk ties and the radial artery was then dissected out.  This encircled with vessel loops proximally and distally.  [name] single arterial branch was ligated and divided between silk ties.  The patient was systemically heparinized.  Control was pulled up on the vessel loops.  An anterior wall arteriotomy was created with an 11 blade and extended with Potts scissors.  The vein was then ligated distally, cut and beveled to an appropriate length to match the arteriotomy and anastomosis created with [name] running 6-0 Prolene suture in the usual fashion.  The vessel was flushed and de-aired prior to release of control.  On release, two 6-0 Prolene  patch sutures were used for hemostasis and hemostasis was complete.  The wound was then irrigated.  Surgicel was placed.  The wound was then closed with [name] running 3-0 Vicryl and 4-0 Monocryl.  Topical papaverine and also in place for some local vasospasm.  Dermabond was placed as [name] dressing.  The patient tolerated the procedure well and was taken to the recovery room in stable condition.     ____________________________ Algernon Huxley, MD jsd:ea D: 07/09/2012 17:11:19 ET T: 07/09/2012 23:06:55 ET JOB#: [id number]  cc: Algernon Huxley, MD, <Dictator> Outside Physician Algernon Huxley MD ELECTRONICALLY SIGNED 07/15/2012 13:53

## 2014-06-11 NOTE — Op Note (Signed)
PATIENT NAME:  Alan [name], Alan [name] MR#:  [id number] DATE OF BIRTH:  16-Dec-1940  DATE OF PROCEDURE:  08/25/2013  PREOPERATIVE DIAGNOSES: 1.  Complication of dialysis device with hematoma formation, left radiocephalic fistula.  2.  End-stage renal disease requiring hemodialysis.   POSTOPERATIVE DIAGNOSES:  1.  Complication of dialysis device with hematoma formation, left radiocephalic fistula.  2.  End-stage renal disease requiring hemodialysis.   PROCEDURES PERFORMED: 1. Insertion of right internal jugular cuffed tunneled dialysis catheter with ultrasound and fluoroscopic guidance.  2.  Contrast injection of left radiocephalic fistula.   SURGEON: Hortencia Pilar, MD  SEDATION: Versed 3 mg plus fentanyl 100 mcg administered IV. Continuous ECG, pulse oximetry and cardiopulmonary monitoring was performed throughout the entire procedure by the interventional radiology nurse. Total sedation time is 45 minutes.   ACCESSES:  1.  Right IJ.  2.  [name] 5-French microcatheter.   CONTRAST USED: Isovue 20 mL.   FLUOROSCOPY TIME: 2.3 minutes.   INDICATIONS: Alan [name] is [name] 74 year old gentleman maintained on hemodialysis who experienced [name] large hematoma formation during dialysis on Monday. Risks and benefits for treatment options were reviewed, all questions answered, and the patient has agreed to proceed.   DESCRIPTION OF PROCEDURE: The patient is taken to the special procedure suite, placed in the supine position. After adequate sedation is achieved, he is positioned supine with his neck extended slightly and rotated to the left and with his left arm positioned extended palm upward. Right neck and chest wall as well as the left arm are prepped and draped in sterile fashion. Appropriate timeout is called.   Ultrasound is placed in [name] sterile sleeve. Jugular vein is identified. It is echolucent and compressible indicating patency. Image is recorded for the permanent record and [name] Seldinger needle is inserted  into the jugular vein. J-wire is advanced, but it seems to curl and double back at the level of the clavicular head; therefore, [name] microsheath is inserted over the J-wire. Hand injection of contrast through the microsheath demonstrates there is no significant obstruction and [name] microwire is then easily negotiated into the innominate and superior vena cava. The microsheath is advanced, and [name] J-wire is then swapped out. Counterincision is created with an 11 blade scalpel, [name] small pocket is fashioned with blunt dissection and dilator is passed over the wire. Dilator and peel-away sheath is then inserted and the dilator and wire are removed. [name] 19-cm tip to cuff Cannon catheter is advanced without difficulty.   Under fluoroscopic guidance, the tips are positioned at the proper atriocaval junction. The catheter is approximated to chest wall. Exit site is selected. [name] small incision is made and the tunneling device is passed subcutaneously. The catheter is then pulled subcutaneously and then adjusted for proper tip position under fluoroscopic guidance. The catheter is transected, the hubs are connected and both lumens aspirate and flush easily and are packed with 5000 units of heparin per lumen.   Then 5-0 Monocryl is used to close the neck counterincision, Dermabond is applied and the catheter is secured to the skin of the chest wall with 0 silk suture and [name] sterile dressing is applied.   Attention is then turned to the fistula where because of the hematoma ultrasound is utilized. Fistula is then accessed with [name] micropuncture needle, microwire followed by microsheath. Hand injection of contrast through the microsheath is then used to demonstrate the fistula, including the central veins as well as [name] reflux image. After review of the images, the microsheath is pulled,  light pressure is held, and there are no immediate complications.   INTERPRETATION: There is no extrinsic compression of the fistula by the hematoma.  More proximally the veins in the upper arm as well as the central veins are widely patent. On reflux imaging, there is [name] greater than 70% narrowing just above the level of the arterial anastomosis and this Alan [name] be treated, but Alan [name] require [name] retrograde sheath insertion which would be directly through the hematoma at this time; therefore, the patient Alan [name] be returned for treatment of his arterial stenosis after his hematoma has resolved.  ____________________________ Katha Cabal, MD ggs:sb D: 08/25/2013 17:25:20 ET T: 08/26/2013 07:49:07 ET JOB#: [id number]  cc: Katha Cabal, MD, <Dictator> Katha Cabal MD ELECTRONICALLY SIGNED 09/14/2013 12:20

## 2014-06-11 NOTE — Op Note (Signed)
PATIENT NAME:  Alan [name], Alan [name] A MR#:  [id number] DATE OF BIRTH:  1940/04/13  DATE OF PROCEDURE:  06/10/2013  PREOPERATIVE DIAGNOSES:  1.  End-stage renal disease.  2.  Poorly functioning left arm arteriovenous fistula.  3.  History of stroke.  4.  Hypertension.  5.  Diabetes.   POSTOPERATIVE DIAGNOSES: 1.  End-stage renal disease.  2.  Poorly functioning left arm arteriovenous fistula.  3.  History of stroke.  4.  Hypertension.  5.  Diabetes.   PROCEDURES:  1.  Ultrasound guidance for vascular access to left radiocephalic arteriovenous fistula.  2.  Left upper extremity fistulogram and central venogram.  3.  Percutaneous transluminal angioplasty of the perianastomotic stenosis with 5 mm and 6 mm diameter angioplasty balloon.   SURGEON: Leotis Pain, M.D.   ANESTHESIA: Local with moderate conscious sedation.   ESTIMATED BLOOD LOSS: 25 mL.  FLUOROSCOPY TIME: Approximately 2 minutes.   CONTRAST USED: 35 mL.   INDICATION FOR PROCEDURE: A 74 year old African American male with end-stage renal disease. His flows in his left arm AV fistula are down and a noninvasive study showed stenosis in the perianastomotic region. Fistulogram is performed for further evaluation and potential treatment. Risks and benefits were discussed. Informed consent was obtained.   DESCRIPTION OF PROCEDURE: The patient is brought to the vascular suite. The left upper extremity was sterilely prepped and draped and a sterile surgical field was created. The fistula was accessed in the proximal forearm in a retrograde fashion with a micropuncture needle. Micropuncture wire and sheath were then placed. Imaging was performed of the fistula with a Kumpe catheter placed just into the radial artery. There was some diffuse intimal hyperplasia over the first few centimeters of the cephalic vein in the perianastomotic region representing, what appeared to be, at least a moderate to high-grade stenosis in the 70% to 80% range. The  remainder of the cephalic vein appeared patent. There was dual outflow in the upper arm to the cephalic and the basilic vein and the central venous circulation was patent. I then heparinized the patient with 3000 units of intravenous heparin. A 6-French sheath was placed and a Magic torque wire was placed through the sheath and into the radial artery. The perianastomotic region was treated initially with a 5 mm diameter angioplasty balloon and then with a 6 mm diameter angioplasty balloon. Following this, there was about a 30% residual stenosis, which was not flow limiting.   At this point, I elected to terminate the procedure. The sheath was removed. A 4-0 Monocryl pursestring suture was placed and pressure was held. Sterile dressing was placed. The patient tolerated the procedure well and was taken to the recovery room in stable condition.   ____________________________ Algernon Huxley, MD jsd:aw D: 06/10/2013 [id number] ET T: 06/10/2013 13:24:51 ET JOB#: [id number]  cc: Algernon Huxley, MD, <Dictator> Algernon Huxley MD ELECTRONICALLY SIGNED 06/14/2013 9:37

## 2014-06-11 NOTE — Op Note (Signed)
PATIENT NAME:  Alan [name], Alan [name] A MR#:  [id number] DATE OF BIRTH:  1941/01/02  DATE OF PROCEDURE:  09/28/2013  PREOPERATIVE DIAGNOSES: 1.  Complication of dialysis device with hematoma formation, left radiocephalic fistula.  2.  End-stage renal disease requiring hemodialysis.   POSTOPERATIVE DIAGNOSIS:  1.  Complication of dialysis device with hematoma formation, left radiocephalic fistula.  2.  End-stage renal disease requiring hemodialysis.   PROCEDURES PERFORMED:   1.  Angiography of left radiocephalic fistula.  2.  Percutaneous transluminal angioplasty of the arterial anastomosis to a maximum diameter of 5 mm.   SURGEON: Katha Cabal, MD.   SEDATION: Versed 4 mg plus fentanyl 150 mcg administered IV. Continuous ECG, pulse oximetry, and cardiopulmonary monitoring were performed throughout the entire procedure by the interventional radiology nurse.   TOTAL SEDATION TIME: One hour.   ACCESS: A 6 French sheath, retrograde direction, left arm AV fistula.   CONTRAST USED: Isovue 40 mL.   FLUOROSCOPY TIME: 1.9 minutes.   INDICATIONS: Alan [name] is a 74 year old gentleman who sustained a large hematoma of his left wrist fistula. Subsequent workup has demonstrated a pronounced narrowing at the arterial anastomosis. Risks and benefits are reviewed for intervention and the patient has agreed to proceed.   DESCRIPTION OF PROCEDURE: The patient is taken to special procedures and placed in the supine position. After adequate sedation is achieved, he is positioned with his left arm extending, palm upward. The left arm was prepped and draped in a sterile fashion. Lidocaine 1% is infiltrated in the soft tissues overlying the fistula after ultrasound has been placed in a sterile sleeve and utilized to evaluate the fistula up in the region of the antecubital fossa. A large area of the vein is identified and after lidocaine is infiltrated, a micropuncture needle is inserted in a retrograde direction,  microwire followed by micro sheath, J-wire followed by a 6 French sheath. Using a Glidewire and Kumpe catheter, the catheter is negotiated into the radial artery distally and hand injection of contrast is utilized to demonstrate the distal radial artery and the more distal fistula. String sign is noted over a distance of approximately 3 cm beginning at the anastomosis and extending more proximally. Heparin 3000 units is given. A 0.035 wire is introduced, and initially a 3 x 6 balloon is used to angioplasty this lesion. Followup imaging demonstrates that it is improved but under size and subsequently a 4 x 6 Lutonix, and then a 5 x 4 Lutonix are utilized. Both of the Lutonix balloons are inflated for 3 minutes to 12 to 14 atmospheres. Following the third inflation with a 5 mm balloon, there is now an excellent result with minimal residual stenosis and a significant improvement.   The remaining more proximal venous outflow is then studied, after which a 4-0 pursestring Monocryl suture is placed around the sheath. The sheath is removed and there are no immediate complications.   INTERPRETATION: Initial views of the fistula demonstrate several centimeters of string sign. The fistula also appears to have a stricture in the radial artery just proximal to the anastomosis. The distal portion of the anastomosis and radial artery appears to be widely patent, and there appears to be or the distal portion of the anastomosis and radial artery appears to be widely patent. There appears to be retrograde flow in the distal radial artery into the fistula essentially from the ulnar artery perfusing the hand and then the fistula in a retrograde fashion. Following angioplasty, first to 3 mm, but ultimately  to 5 mm, there is now essentially resolution of the previously noted stricture stenosis. Given the sharp angulation at the level of the anastomosis, attempting to treat the proximal radial artery lesion is not undertaken, and I  have discussed with the patient in the postoperative area, as well as his wife, that formal arterial angiography from a femoral approach with treatment of this lesion would be the most effective way to treat it. He Browning follow up in the office, and a duplex ultrasound Key evaluate for the arterial flow characteristics including whether he is continuing to retrograde fill his fistula.    ____________________________ Katha Cabal, MD ggs:at D: 09/28/2013 17:26:13 ET T: 09/28/2013 19:45:15 ET JOB#: [id number]  cc: Katha Cabal, MD, <Dictator> Katha Cabal MD ELECTRONICALLY SIGNED 09/29/2013 11:24

## 2014-06-11 NOTE — Op Note (Signed)
PATIENT NAME:  Alan [name], Alan [name] MR#:  [id number] DATE OF BIRTH:  [birth year]  DATE OF PROCEDURE:  12/07/2013  PREOPERATIVE DIAGNOSES:  1.  Complication of arteriovenous fistula.  2.  End-stage renal disease requiring hemodialysis.  3.  Hypertension.   POSTOPERATIVE DIAGNOSES:  1.  Complication of arteriovenous fistula.  2.  End-stage renal disease requiring hemodialysis.  3.  Hypertension.   PROCEDURES PERFORMED:  1.  Contrast injection left radiocephalic fistula.  2.  Percutaneous transluminal angioplasty of the arterial anastomosis left radiocephalic fistula.   SURGEON: Alan Pilar, MD.   SEDATION: Versed 4 mg plus fentanyl 150 mcg administered IV. Continuous ECG, pulse oximetry, and cardiopulmonary monitoring were performed throughout the entire procedure by the interventional radiology nurse. Total sedation time was 1 hour.   ACCESS: [name] 6 French sheath retrograde direction left radiocephalic fistula.   CONTRAST USED: Isovue 45 mL.   FLUOROSCOPY TIME: 2.9 minutes.   INDICATIONS: Alan [name] is [name] 74 year old gentleman who was sent from dialysis secondary to low flows and poor dialysis as well as difficulty with accessing his fistula. 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 Risks and benefits for angiography and intervention are reviewed. All questions answered. The patient agrees to proceed.   DESCRIPTION OF PROCEDURE: The patient is taken to special procedures and placed in the supine position. After adequate sedation is achieved he is positioned supine with his left arm extended palm upward. The left arm is then prepped and draped in sterile fashion. Appropriate timeout is called.   1% lidocaine is infiltrated in the soft tissues overlying the more proximal outflow of the fistula and micropuncture needle is inserted, microwire followed by micro sheath, J-wire followed by [name] 6 French sheath. Using [name] floppy Glidewire  and [name] Kumpe catheter the wire is negotiated into the radial artery and then the catheter and subsequently imaging of the fistula is obtained. 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 [name] Magic torque wire is then advanced through the Kumpe catheter. The catheter is removed, 3000 units of heparin is given and subsequently [name] 4 x 6 Lutonix balloon and then [name] 6 x 8 Lutonix balloon is advanced over the wire. Both inflations are for 3 minutes. Subsequently the KMP catheter is reintroduced and contrast injection is performed.   The area is still undersized and [name] 7 x 6 [place] balloon is advanced over the wire and inflated to 16 atmospheres for 2 minutes. Followup imaging now demonstrates that there is [name] smooth contour measuring approximately 6 mm from the actual anastomosis up to the areas of cannulation, this represents [name] huge improvement compared to the preoperative.   The remaining fistula and the central veins are then imaged and then subsequently [name] pursestring suture is placed around the sheath and the sheath is removed. There are no immediate complications.   INTERPRETATION: Initial views demonstrate [name] greater than 80% narrowing at the anastomosis, visualized portions of the radial artery are widely patent. The area of cannulation of the fistula is nicely dilated and venous outflow of the upper arm as well as the central veins are all widely patent.   Following angioplasty beginning at 4 and extending up to 7 using Lutonix balloons to prevent restenosis there is now an excellent result with patency of the arterial portion and [name] minimum diameter of approximately 6 mm.   SUMMARY: Successful salvage of left wrist radiocephalic fistula.    ____________________________ Alan Cabal, MD ggs:bu  D: 12/08/2013 09:34:23 ET T: 12/08/2013 15:46:40 ET JOB#: [id number]  cc: Alan Cabal, MD, <Dictator> Alan Cabal MD ELECTRONICALLY SIGNED 12/22/2013 11:44

## 2014-06-11 NOTE — Op Note (Signed)
PATIENT NAME:  Alan [name], Alan [name] MR#:  [id number] DATE OF BIRTH:  May 16, 1940  DATE OF OPERATION:  02/25/2013  PREOPERATIVE DIAGNOSES: 1.  End-stage renal disease.  2.  Poorly functioning left arm arteriovenous fistula.  3.  Hypertension.   POSTOPERATIVE DIAGNOSES:  1.  End-stage renal disease.  2.  Poorly functioning left arm arteriovenous fistula.  3.  Hypertension.   PROCEDURE:  1.  Ultrasound guidance for vascular access, left radiocephalic AV fistula.  2.  Left upper extremity fistulogram and central venogram.  3.  Percutaneous transluminal angioplasty of peri-anastomotic stenosis with [name] 5 mm and 6 mm diameter angioplasty balloon.   SURGEON: Algernon Huxley, M.D.   ANESTHESIA: Local with moderate conscious sedation.   BLOOD LOSS: Minimal.   INDICATION FOR PROCEDURE:  [name] 74 year old African-American male with end-stage renal disease. His left radiocephalic AV fistula has been flowing poorly, and noninvasive study showed stenosis near the anastomosis. He is brought in for angiography for further evaluation and potential treatment. Risks and benefits were discussed. Informed consents were obtained.   DESCRIPTION OF PROCEDURE: The patient is brought to the vascular and interventional radiology suite. Left upper extremity was sterilely prepped and draped, and [name] sterile surgical field was created. The radiocephalic fistula was accessed retrograde and the cephalic vein just below the antecubital fossa with [name] micropuncture needle under direct ultrasound guidance, and [name] permanent image was recorded. [name] micropuncture wire and sheath were then placed, and we upsized to [name] 6 [place] sheath. [name] Kumpe catheter was placed at the anastomosis. Imaging demonstrated intimal hyperplasia in the peri-anastomotic cephalic vein over about [name] 2 to 3 cm segment, with stenosis of greater than 80%. The remainder of the fistula was patent, and there was outflow through the deep venous system as well as the cephalic vein in the  upper arm. The central venous circulation was patent. [name] Magic torque wire was placed. [name] 5 mm diameter angioplasty was inflated in the peri-anastomotic stenosis. [name] waist was taken, which resolved with angioplasty. Following angioplasty with [name] 5 mm balloon, there is still [name] greater than 50% residual stenosis, and I decided to upsize to [name] 6 mm diameter angioplasty balloon. Following this, the residual stenosis was only in the 20% to 30% range. At this point, I elected to terminate the procedure. The sheath was removed. [name] 4-0 Monocryl pursestring suture was placed. The patient tolerated the procedure well, and was taken to the recovery room in stable condition.    ____________________________ Algernon Huxley, MD jsd:mr D: 02/25/2013 13:15:55 ET T: 02/25/2013 19:30:09 ET JOB#: [id number]  cc: Algernon Huxley, MD, <Dictator> Algernon Huxley MD ELECTRONICALLY SIGNED 03/08/2013 11:43

## 2014-06-19 NOTE — Op Note (Signed)
PATIENT NAME:  Alan [name], Alan [name] MR#:  [id number] DATE OF BIRTH:  01/27/1941  DATE OF PROCEDURE:  04/21/2014  PREOPERATIVE DIAGNOSES:  1.  End-stage renal disease.  2.  Poorly functioning left arm arteriovenous fistula.   POSTOPERATIVE DIAGNOSES:  1.  End-stage renal disease.  2.  Poorly functioning left arm arteriovenous fistula.   PROCEDURES:  1.  Ultrasound guidance for vascular access to left radiocephalic arteriovenous fistula.  2.  Left upper extremity fistulogram and central venogram.  3.  Percutaneous transluminal angioplasty of peri-anastomotic stenosis with 5 mm diameter drug-coated angioplasty balloon and 6 and 7 mm diameter high-pressure angioplasty balloon.   SURGEON: Algernon Huxley, MD.   ANESTHESIA: Local with moderate conscious sedation.   ESTIMATED BLOOD LOSS: Minimal.   CONTRAST USED: 35 mL Visipaque.   FLUOROSCOPY TIME: Less than 2 minutes.   INDICATION FOR PROCEDURE: [name] 74 year old gentleman with end-stage renal disease. We have previously treated him for his dialysis access.  His dialysis center sent him over for poor flows. Risks and benefits were discussed. Informed consent was obtained.   DESCRIPTION OF PROCEDURE: The patient was brought to the vascular suite. The left upper extremity was sterilely prepped and draped and [name] sterile surgical field was created. The fistula was accessed between the antecubital fossa and the venous access site under direct ultrasound guidance in [name] retrograde fashion with [name] micropuncture needle and [name] permanent image was recorded. [name] micropuncture wire and sheath were then placed, and we upsized to [name] 6 [place] sheath. Imaging through the sheath showed good upper arm flow with dual outflow through the basilic and the cephalic vein and no central venous stenosis. [name] Kumpe catheter was placed in the radial artery and imaging was performed. This showed [name] very high-grade stenosis in the cephalic vein starting at the origin of the fistula and going for  about 3 cm into the cephalic vein. I then replaced the wire and gave the patient 3000 units of intravenous heparin. [name] 5 mm diameter Lutonix drug-coated angioplasty balloon was inflated with the distal tip in the radial artery and the proximal portion of the balloon near the sheath. This inflation was held for 1 minute and completion angiogram still showed significant residual stenosis. I upsized to [name] 6 and then [name] 7 mm diameter angioplasty balloon, inflated the 7 mm balloon up to 20 atmospheres with resolution of the waist at this point. Following this there was significantly improved flow, although there was about [name] 40% residual stenosis, this was not flow limiting and he had an excellent thrill within the fistula. I did not want to go any larger at this point and elected to terminate the procedure. The sheath was removed, 4-0 Monocryl pursestring suture was placed. Pressure was held. Sterile dressings were placed. The patient tolerated the procedure well and was taken to the recovery room in stable condition.     ____________________________ Algernon Huxley, MD jsd:bu D: 04/21/2014 09:54:31 ET T: 04/21/2014 19:46:24 ET JOB#: [id number]  cc: Algernon Huxley, MD, <Dictator> Algernon Huxley MD ELECTRONICALLY SIGNED 04/28/2014 10:37

## 2014-08-09 ENCOUNTER — Encounter
Admission: RE | Disposition: A | Discharge status: Home / Self Care | Payer: Self-pay | Source: Ambulatory Visit | Attending: Vascular Surgery

## 2014-08-09 ENCOUNTER — Encounter: Payer: Self-pay | Admitting: *Deleted

## 2014-08-09 ENCOUNTER — Ambulatory Visit
Admission: RE | Admit: 2014-08-09 | Discharge: 2014-08-09 | Disposition: A | Discharge status: Home / Self Care | Payer: Medicare Other | Source: Ambulatory Visit | Attending: Vascular Surgery | Admitting: Vascular Surgery

## 2014-08-09 DIAGNOSIS — Z79899 Other long term (current) drug therapy: Secondary | ICD-10-CM | POA: Insufficient documentation

## 2014-08-09 DIAGNOSIS — Y841 Kidney dialysis as the cause of abnormal reaction of the patient, or of later complication, without mention of misadventure at the time of the procedure: Secondary | ICD-10-CM | POA: Insufficient documentation

## 2014-08-09 DIAGNOSIS — E785 Hyperlipidemia, unspecified: Secondary | ICD-10-CM | POA: Insufficient documentation

## 2014-08-09 DIAGNOSIS — Z794 Long term (current) use of insulin: Secondary | ICD-10-CM | POA: Diagnosis not present

## 2014-08-09 DIAGNOSIS — N186 End stage renal disease: Secondary | ICD-10-CM | POA: Diagnosis not present

## 2014-08-09 DIAGNOSIS — Z992 Dependence on renal dialysis: Secondary | ICD-10-CM | POA: Insufficient documentation

## 2014-08-09 DIAGNOSIS — I120 Hypertensive chronic kidney disease with stage 5 chronic kidney disease or end stage renal disease: Secondary | ICD-10-CM | POA: Insufficient documentation

## 2014-08-09 DIAGNOSIS — E1122 Type 2 diabetes mellitus with diabetic chronic kidney disease: Secondary | ICD-10-CM | POA: Diagnosis not present

## 2014-08-09 DIAGNOSIS — T82858A Stenosis of vascular prosthetic devices, implants and grafts, initial encounter: Secondary | ICD-10-CM | POA: Insufficient documentation

## 2014-08-09 HISTORY — DX: Thrombosis due to vascular prosthetic devices, implants and grafts, initial encounter: T82.868A

## 2014-08-09 HISTORY — PX: PERIPHERAL VASCULAR CATHETERIZATION: SHX172C

## 2014-08-09 LAB — POTASSIUM (ARMC VASCULAR LAB ONLY): Potassium (ARMC vascular lab): 3.8

## 2014-08-09 SURGERY — A/V SHUNTOGRAM/FISTULAGRAM
Anesthesia: Moderate Sedation

## 2014-08-09 MED ORDER — CEFAZOLIN SODIUM 1-5 GM-% IV SOLN
1.0000 g | Freq: Once | INTRAVENOUS | Status: AC
  Administered 2014-08-09: 1 g via INTRAVENOUS

## 2014-08-09 MED ORDER — SODIUM CHLORIDE 0.9 % IV SOLN: INTRAVENOUS | Status: DC

## 2014-08-09 MED ORDER — HEPARIN (PORCINE) IN NACL 2-0.9 UNIT/ML-% IJ SOLN
INTRAMUSCULAR | Status: AC
  Filled 2014-08-09: qty 1000

## 2014-08-09 MED ORDER — LIDOCAINE HCL (PF) 1 % IJ SOLN
INTRAMUSCULAR | Status: AC
  Filled 2014-08-09: qty 10

## 2014-08-09 MED ORDER — CEFAZOLIN SODIUM 1-5 GM-% IV SOLN
INTRAVENOUS | Status: AC
  Filled 2014-08-09: qty 50

## 2014-08-09 MED ORDER — MIDAZOLAM HCL 5 MG/5ML IJ SOLN
INTRAMUSCULAR | Status: AC
  Filled 2014-08-09: qty 5

## 2014-08-09 MED ORDER — FENTANYL CITRATE (PF) 100 MCG/2ML IJ SOLN
INTRAMUSCULAR | Status: AC
  Filled 2014-08-09: qty 2

## 2014-08-09 MED ORDER — FENTANYL CITRATE (PF) 100 MCG/2ML IJ SOLN
INTRAMUSCULAR | Status: DC | PRN
  Administered 2014-08-09 (×2): 50 ug via INTRAVENOUS

## 2014-08-09 MED ORDER — IOPAMIDOL (ISOVUE-300) INJECTION 61%
INTRAVENOUS | Status: DC | PRN
  Administered 2014-08-09: 50 mL via INTRAVENOUS

## 2014-08-09 MED ORDER — HEPARIN SODIUM (PORCINE) 1000 UNIT/ML IJ SOLN
INTRAMUSCULAR | Status: DC | PRN
  Administered 2014-08-09: 3000 [IU] via INTRAVENOUS

## 2014-08-09 MED ORDER — MIDAZOLAM HCL 2 MG/2ML IJ SOLN
INTRAMUSCULAR | Status: DC | PRN
  Administered 2014-08-09: 1 mg via INTRAVENOUS
  Administered 2014-08-09: 2 mg via INTRAVENOUS

## 2014-08-09 SURGICAL SUPPLY — 13 items
BALLN LUTONIX DCB 7X40X130 (BALLOONS) ×4
BALLOON LUTONIX DCB 7X40X130 (BALLOONS) ×2 IMPLANT
CATH KUMPE (CATHETERS) ×2
CATH SLIP 5FR .038X65 KMP (CATHETERS) ×2
CATH SLIP 5FR 0.38 X 40 KMP (CATHETERS) ×2 IMPLANT
DRAPE BRACHIAL (DRAPES) ×4 IMPLANT
GUIDEWIRE ANGLED .035 180CM (WIRE) ×4 IMPLANT
PACK ANGIOGRAPHY (CUSTOM PROCEDURE TRAY) ×4 IMPLANT
SET INTRO CAPELLA COAXIAL (SET/KITS/TRAYS/PACK) ×4 IMPLANT
SHEATH BRITE TIP 6FRX11 (SHEATH) ×4 IMPLANT
SUT MON AB 4-0 PC3 18 (SUTURE) ×4 IMPLANT
TOWEL OR 17X26 4PK STRL BLUE (TOWEL DISPOSABLE) ×4 IMPLANT
WIRE MAGIC TOR.035 180C (WIRE) ×4 IMPLANT

## 2014-08-09 NOTE — H&P (Signed)
[facility] History & Physical Update  The patient was interviewed and re-examined.  The patient's previous History and Physical has been reviewed and is unchanged.  There is no change in the plan of care.  Daijon Wenke, Dolores Lory, MD  08/09/2014, 3:04 PM

## 2014-08-09 NOTE — OR Nursing (Signed)
FSBS at home: 135 this am, took no DM  meds this am

## 2014-08-09 NOTE — Discharge Instructions (Signed)
Fistulogram, Care After °Refer to this sheet in the next few weeks. These instructions provide you with information on caring for yourself after your procedure. Your health care provider may also give you more specific instructions. Your treatment has been planned according to current medical practices, but problems sometimes occur. Call your health care provider if you have any problems or questions after your procedure. °WHAT TO EXPECT AFTER THE PROCEDURE °After your procedure, it is typical to have the following: °· A small amount of discomfort in the area where the catheters were placed. °· A small amount of bruising around the fistula. °· Sleepiness and fatigue. °HOME CARE INSTRUCTIONS °· Rest at home for the day following your procedure. °· Do not drive or operate heavy machinery while taking pain medicine. °· Take medicines only as directed by your health care provider. °· Do not take baths, swim, or use a hot tub until your health care provider approves. You may shower 24 hours after the procedure or as directed by your health care provider. °· There are many different ways to close and cover an incision, including stitches, skin glue, and adhesive strips. Follow your health care provider's instructions on: °¨ Incision care. °¨ Bandage (dressing) changes and removal. °¨ Incision closure removal. °· Monitor your dialysis fistula carefully. °SEEK MEDICAL CARE IF: °· You have drainage, redness, swelling, or pain at your catheter site. °· You have a fever. °· You have chills. °SEEK IMMEDIATE MEDICAL CARE IF: °· You feel weak. °· You have trouble balancing. °· You have trouble moving your arms or legs. °· You have problems with your speech or vision. °· You can no longer feel a vibration or buzz when you put your fingers over your dialysis fistula. °· The limb that was used for the procedure: °¨ Swells. °¨ Is painful. °¨ Is cold. °¨ Is discolored, such as blue or pale white. °Document Released: 06/21/2013  Document Reviewed: 03/26/2013 °ExitCare® Patient Information ©2015 [employer]. This information is not intended to replace advice given to you by your health care provider. Make sure you discuss any questions you have with your health care provider. ° °

## 2014-08-10 ENCOUNTER — Encounter: Payer: Self-pay | Admitting: Vascular Surgery

## 2014-11-05 ENCOUNTER — Encounter (HOSPITAL_COMMUNITY): Payer: Self-pay | Admitting: Emergency Medicine

## 2014-11-05 ENCOUNTER — Emergency Department (HOSPITAL_COMMUNITY)
Admission: EM | Admit: 2014-11-05 | Discharge: 2014-11-05 | Disposition: A | Discharge status: Home / Self Care | Payer: Medicare Other | Attending: Emergency Medicine | Admitting: Emergency Medicine

## 2014-11-05 DIAGNOSIS — I10 Essential (primary) hypertension: Secondary | ICD-10-CM | POA: Diagnosis not present

## 2014-11-05 DIAGNOSIS — N390 Urinary tract infection, site not specified: Secondary | ICD-10-CM | POA: Diagnosis not present

## 2014-11-05 DIAGNOSIS — I2510 Atherosclerotic heart disease of native coronary artery without angina pectoris: Secondary | ICD-10-CM | POA: Diagnosis not present

## 2014-11-05 DIAGNOSIS — Z87891 Personal history of nicotine dependence: Secondary | ICD-10-CM | POA: Insufficient documentation

## 2014-11-05 DIAGNOSIS — Z89512 Acquired absence of left leg below knee: Secondary | ICD-10-CM | POA: Insufficient documentation

## 2014-11-05 DIAGNOSIS — M109 Gout, unspecified: Secondary | ICD-10-CM | POA: Insufficient documentation

## 2014-11-05 DIAGNOSIS — J45909 Unspecified asthma, uncomplicated: Secondary | ICD-10-CM | POA: Insufficient documentation

## 2014-11-05 DIAGNOSIS — Z8673 Personal history of transient ischemic attack (TIA), and cerebral infarction without residual deficits: Secondary | ICD-10-CM | POA: Diagnosis not present

## 2014-11-05 DIAGNOSIS — M1990 Unspecified osteoarthritis, unspecified site: Secondary | ICD-10-CM | POA: Diagnosis not present

## 2014-11-05 DIAGNOSIS — Z951 Presence of aortocoronary bypass graft: Secondary | ICD-10-CM | POA: Insufficient documentation

## 2014-11-05 DIAGNOSIS — R300 Dysuria: Secondary | ICD-10-CM | POA: Diagnosis present

## 2014-11-05 DIAGNOSIS — Z8719 Personal history of other diseases of the digestive system: Secondary | ICD-10-CM | POA: Diagnosis not present

## 2014-11-05 DIAGNOSIS — Z89511 Acquired absence of right leg below knee: Secondary | ICD-10-CM | POA: Insufficient documentation

## 2014-11-05 DIAGNOSIS — Z79899 Other long term (current) drug therapy: Secondary | ICD-10-CM | POA: Insufficient documentation

## 2014-11-05 DIAGNOSIS — Z794 Long term (current) use of insulin: Secondary | ICD-10-CM | POA: Diagnosis not present

## 2014-11-05 DIAGNOSIS — E119 Type 2 diabetes mellitus without complications: Secondary | ICD-10-CM | POA: Diagnosis not present

## 2014-11-05 DIAGNOSIS — Z7982 Long term (current) use of aspirin: Secondary | ICD-10-CM | POA: Diagnosis not present

## 2014-11-05 LAB — URINE MICROSCOPIC-ADD ON

## 2014-11-05 LAB — URINALYSIS, ROUTINE W REFLEX MICROSCOPIC
GLUCOSE, UA: 100 mg/dL — AB
Nitrite: POSITIVE — AB
Specific Gravity, Urine: 1.01 (ref 1.005–1.030)
Urobilinogen, UA: 4 mg/dL — ABNORMAL HIGH (ref 0.0–1.0)
pH: 7 (ref 5.0–8.0)

## 2014-11-05 MED ORDER — CEFTRIAXONE SODIUM 1 G IJ SOLR
1.0000 g | Freq: Once | INTRAMUSCULAR | Status: AC
  Administered 2014-11-05: 1 g via INTRAMUSCULAR
  Filled 2014-11-05: qty 10

## 2014-11-05 MED ORDER — LIDOCAINE HCL (PF) 1 % IJ SOLN
INTRAMUSCULAR | Status: AC
  Filled 2014-11-05: qty 5

## 2014-11-05 MED ORDER — CEPHALEXIN 500 MG PO CAPS: 500.0000 mg | ORAL_CAPSULE | Freq: Four times a day (QID) | ORAL | Status: DC

## 2014-11-05 NOTE — ED Provider Notes (Signed)
CSN: [id number]     Arrival date & time 11/05/14  1904 History   First MD Initiated Contact with Patient 11/05/14 1916     Chief Complaint  Patient presents with  . Dysuria    HPI Pt was seen at [facility]. Per pt and his wife, c/o gradual onset and persistence of constant dysuria for the past 2 days. Has been associated with urinary frequency. Endorses hx of same, dx UTI, rx antibiotics. Denies perineal pain, no penile pain, no testicular pain/swelling, no back pain, no abd pain, no N/V/D, no fevers, no rash, no injury. LD HD yesterday per routine and states he makes urine "5 or 6" times per day.    Past Medical History  Diagnosis Date  . Diabetes mellitus   . Hypertension   . Gout   . Coronary artery disease   . Stroke   . Asthma   . Renal insufficiency   . Hypercholesteremia   . Arthritis   . DDD (degenerative disc disease), lumbar   . DDD (degenerative disc disease), cervical   . Gangrene of toe Feb. 2015    Right  great    . Gastric ulcer     EGD 2014  . Thrombosis of renal dialysis arteriovenous graft   . Clotted renal dialysis arteriovenous graft    Past Surgical History  Procedure Laterality Date  . Coronary artery bypass graft    . Below knee leg amputation Bilateral   . Esophagogastroduodenoscopy Left 05/24/2012    [id number] dilated baggy but otherwise a normal/ Small hiatal hernia. Gastric ulcer -S/P biopsy  . Amputation Right 04/10/2013    Procedure: Right Below Knee Amputation;  Surgeon: Newt Minion, MD;  Location: [facility];  Service: Orthopedics;  Laterality: Right;  Right Below Knee Amputation  . Appendectomy    . Peripheral vascular catheterization N/A 08/09/2014    Procedure: A/V Shuntogram/Fistulagram;  Surgeon: Katha Cabal, MD;  Location: [facility];  Service: Cardiovascular;  Laterality: N/A;  . Peripheral vascular catheterization Left 08/09/2014    Procedure: A/V Shunt Intervention;  Surgeon: Katha Cabal, MD;  Location: [facility];  Service: Cardiovascular;  Laterality: Left;   Family History  Problem Relation Age of Onset  . Colon cancer Neg Hx    Social History  Substance Use Topics  . Smoking status: Former Smoker    Quit date: 08/08/2004  . Smokeless tobacco: Never Used  . Alcohol Use: No    Review of Systems ROS: Statement: All systems negative except as marked or noted in the HPI; Constitutional: Negative for fever and chills. ; ; Eyes: Negative for eye pain, redness and discharge. ; ; ENMT: Negative for ear pain, hoarseness, nasal congestion, sinus pressure and sore throat. ; ; Cardiovascular: Negative for chest pain, palpitations, diaphoresis, dyspnea and peripheral edema. ; ; Respiratory: Negative for cough, wheezing and stridor. ; ; Gastrointestinal: Negative for nausea, vomiting, diarrhea, abdominal pain, blood in stool, hematemesis, jaundice and rectal bleeding. . ; ; Genitourinary: +dysuria. Negative for flank pain and hematuria. ; ; Genital:  No penile drainage or rash, no testicular pain or swelling, no scrotal rash or swelling. ;; Musculoskeletal: Negative for back pain and neck pain. Negative for swelling and trauma.; ; Skin: Negative for pruritus, rash, abrasions, blisters, bruising and skin lesion.; ; Neuro: Negative for headache, lightheadedness and neck stiffness. Negative for weakness, altered level of consciousness , altered mental status, extremity weakness, paresthesias, involuntary movement, seizure and syncope.  Allergies  Codeine and Yellow jacket venom  Home Medications   Prior to Admission medications   Medication Sig Start Date End Date Taking? Authorizing Darlis Wragg  acetaminophen (TYLENOL) 500 MG tablet Take 500 mg by mouth 2 (two) times daily.    Historical Elanor Cale, MD  Amino Acid Infusion (PROSOL) 20 % SOLN  10/27/14   Historical Magaret Justo, MD  aspirin EC 81 MG tablet Take 81 mg by mouth at bedtime.     Historical Fahad Cisse, MD  B Complex-C-Folic Acid (NEPHRO-VITE PO) Take 1  tablet by mouth every evening.     Historical Desere Gwin, MD  cinacalcet (SENSIPAR) 30 MG tablet Take 30 mg by mouth daily.    Historical Meaghan Whistler, MD  colchicine 0.6 MG tablet Take 0.6 mg by mouth daily.    Historical Larin Weissberg, MD  docusate sodium (COLACE) 100 MG capsule Take 100 mg by mouth daily as needed for mild constipation.    Historical Rocio Wolak, MD  fluticasone (FLONASE) 50 MCG/ACT nasal spray Place 2 sprays into both nostrils daily as needed for allergies.     Historical Damesha Lawler, MD  furosemide (LASIX) 40 MG tablet Take 20 mg by mouth every evening.    Historical Aubreyana Saltz, MD  lactose free nutrition (BOOST) LIQD Take 237 mLs by mouth daily.    Historical Makina Skow, MD  LANTUS SOLOSTAR 100 UNIT/ML Solostar Pen Inject 6 Units into the skin at bedtime. 12/21/13   Historical Keirstyn Aydt, MD  nitroGLYCERIN (NITROSTAT) 0.4 MG SL tablet Place 0.4 mg under the tongue every 5 (five) minutes as needed for chest pain.     Historical Helmer Dull, MD  omeprazole (PRILOSEC) 20 MG capsule Take 1 capsule (20 mg total) by mouth 2 (two) times daily before a meal. 06/17/13   Radene Gunning, NP  sevelamer carbonate (RENVELA) 800 MG tablet Take 800-1,600 mg by mouth 3 (three) times daily with meals. Patient takes 2 tablets with meals and 1 tablet with snack    Historical Jash Wahlen, MD  vitamin C (ASCORBIC ACID) 500 MG tablet Take 500 mg by mouth daily.    Historical Almas Rake, MD   BP 150/66 mmHg  Pulse 87  Temp(Src) 99.3 F (37.4 C) (Oral)  Resp 13  Wt 174 lb 2.6 oz (79 kg)  SpO2 98% Physical Exam  1925: Physical examination:  Nursing notes reviewed; Vital signs and O2 SAT reviewed;  Constitutional: Well developed, Well nourished, Well hydrated, In no acute distress; Head:  Normocephalic, atraumatic; Eyes: EOMI, PERRL, No scleral icterus; ENMT: Mouth and pharynx normal, Mucous membranes moist; Neck: Supple, Full range of motion, No lymphadenopathy; Cardiovascular: Regular rate and rhythm, No gallop; Respiratory:  Breath sounds clear & equal bilaterally, No wheezes.  Speaking full sentences with ease, Normal respiratory effort/excursion; Chest: Nontender, Movement normal; Abdomen: Soft, Nontender, Nondistended, Normal bowel sounds; Genitourinary: No CVA tenderness; Extremities: Pulses normal, No tenderness, +bilat LE's amputations. +palp thrill AV fistula..; Neuro: AA&Ox3, Major CN grossly intact.  Speech clear. No gross focal motor deficits in extremities.; Skin: Color normal, Warm, Dry.   ED Course  Procedures (including critical care time) Labs Review   Imaging Review  I have personally reviewed and evaluated these images and lab results as part of my medical decision-making.   EKG Interpretation None      MDM  MDM Reviewed: previous chart, nursing note and vitals Interpretation: labs      Results for orders placed or performed during the hospital encounter of 11/05/14  Urinalysis, Routine w reflex microscopic (not at [facility])  Result Value  Ref Range   Color, Urine YELLOW YELLOW   APPearance HAZY (A) CLEAR   Specific Gravity, Urine 1.010 1.005 - 1.030   pH 7.0 5.0 - 8.0   Glucose, UA 100 (A) NEGATIVE mg/dL   Hgb urine dipstick LARGE (A) NEGATIVE   Bilirubin Urine SMALL (A) NEGATIVE   Ketones, ur TRACE (A) NEGATIVE mg/dL   Protein, ur >300 (A) NEGATIVE mg/dL   Urobilinogen, UA 4.0 (H) 0.0 - 1.0 mg/dL   Nitrite POSITIVE (A) NEGATIVE   Leukocytes, UA MODERATE (A) NEGATIVE  Urine microscopic-add on  Result Value Ref Range   Squamous Epithelial / LPF RARE RARE   WBC, UA 21-50 <3 WBC/hpf   RBC / HPF 21-50 <3 RBC/hpf   Bacteria, UA MANY (A) RARE    2035:  +UTI, UC pending. Edder dose IM rocephin here, rx keflex. Dx and testing d/w pt and family.  Questions answered.  Verb understanding, agreeable to d/c home with outpt f/u.     Francine Graven, DO 11/09/14 1302

## 2014-11-05 NOTE — Discharge Instructions (Signed)
°Emergency Department Resource Guide °1) Find a Doctor and Pay Out of Pocket °Although you won't have to find out who is covered by your insurance plan, it is a good idea to ask around and get recommendations. You Samin then need to call the office and see if the doctor you have chosen Porfirio accept you as a new patient and what types of options they offer for patients who are self-pay. Some doctors offer discounts or Sharif set up payment plans for their patients who do not have insurance, but you Andrick need to ask so you aren't surprised when you get to your appointment. ° °2) Contact Your Local Health Department °Not all health departments have doctors that can see patients for sick visits, but many do, so it is worth a call to see if yours does. If you don't know where your local health department is, you can check in your phone book. The CDC also has a tool to help you locate your state's health department, and many state websites also have listings of all of their local health departments. ° °3) Find a Walk-in Clinic °If your illness is not likely to be very severe or complicated, you may want to try a walk in clinic. These are popping up all over the country in pharmacies, drugstores, and shopping centers. They're usually staffed by nurse practitioners or physician assistants that have been trained to treat common illnesses and complaints. They're usually fairly quick and inexpensive. However, if you have serious medical issues or chronic medical problems, these are probably not your best option. ° °No Primary Care Doctor: °- Call [facility] Connect at  832-8000 - they can help you locate a primary care doctor that  accepts your insurance, provides certain services, etc. °- Physician Referral Service- 1-800-533-3463 ° °Chronic Pain Problems: °Organization         Address  Phone   Notes  °[facility]  (336) [phone] Patients need to be referred by their primary care doctor.  ° °Medication  Assistance: °Organization         Address  Phone   Notes  °[place] [facility] [address] °[place], [place] [address] (336) 641-8030 --Must be a resident of [place] °-- Must have NO insurance coverage whatsoever (no Medicaid/ Medicare, etc.) °-- The pt. MUST have a primary care doctor that directs their care regularly and follows them in the community °  °[facility]  (866) 331-1348   °[employer]  (888) 892-1162   ° °Agencies that provide inexpensive medical care: °Organization         Address  Phone   Notes  °[facility]  (336) 832-8035   °[facility]    (336) 832-7272   °[facility] [address] °[place], [place] [address] (336) 832-4777   °[facility] [address], °[place] (336) 271-4999   °[facility]    (336) 373-0678   °[facility]    (336) 272-1050   °[facility] ° [address], [place] Phone:  (336) 832-4444, Fax:  (336) 832-4440 Hours of Operation:  9 am - 6 pm, M-F.  Also accepts Medicaid/Medicare and self-pay.  °[facility] ° [address], [place] Phone: (336) [phone], Fax: (336) [phone]. Hours of Operation:  8:30 am - 5:30 pm, M-F.  Also accepts Medicaid and self-pay.  °[facility] [place] [address], [place] Phone: (336) 878-6027   °[facility] [address], [place], [place] (336)723-1848, Ext. 123 Mondays & Thursdays: 7-9 AM.  First 15 patients are seen on a first come, first serve basis. °  ° °Medicaid-accepting [place] Providers: ° °Organization         Address  Phone   Notes  °[facility] [address], [place] (336) 641-2100 Also accepts self-pay patients.  °[facility] [address], [place] ° (336) 856-9996   °[facility] [address], [place]  (336) 288-8857   °[facility] [address] [place] [address], [place] (336) 299-7000   °Veita Bland [address], [place]  ° (336) 373-1557 Only accepts [place] Access Medicaid patients after they have their name applied to their card.  ° °Self-Pay (no insurance) in [place]: ° °Organization         Address  Phone   Notes  °Sickle Cell Patients, [facility] [address], [place] (336) 832-1970   °[facility] [address], [place] (336) 832-4400   °[facility] ° [address], [place] (336) 992-4800   °[facility]/Dr. Osei-Bonsu ° [address] [place] [address], [place] or [address], [place] (336) [phone] Phone number for both [place] and [place] locations is the same.  °[facility] [address], [place] (336) 299-0000   °[facility] [address] [place] [address], [place] or [address] (336) 852-7530 °(336) 878-2260   °[facility] [address], [place] (336) 350-1642, phone; (336) 294-5005, fax Sees patients 1st and 3rd Saturday of every month.  Must not qualify for public or private insurance (i.e. Medicaid, Medicare, [place] Health Choice, Veterans' Benefits) • Household income should be no more than 200% of the poverty level •The clinic cannot treat you if you are pregnant or think you are pregnant • Sexually transmitted diseases are not treated at the clinic.  ° ° °Dental Care: °Organization         Address  Phone  Notes  °[place] [facility] [address], [place] (336) 641-6152 Accepts children up to age 21 who are enrolled in Medicaid or [place] Health Choice; pregnant women with a Medicaid card; and children who have applied for Medicaid or [place] Health Choice, but were declined, whose parents can pay a reduced fee at time of service.  °[place]  [facility] [place]  [address], [place] (336) 641-7733 Accepts children up to age 21 who are enrolled in Medicaid or [place] Health Choice; pregnant women with a Medicaid card; and children who have applied for Medicaid or [place] Health Choice, but were declined, whose parents can pay a reduced fee at time of service.  °[facility] ° [address], [place] (336) 641-4533 Patients are seen by appointment only. Walk-ins are not accepted. [facility] see patients 18 years of age and older. °Monday - Tuesday (8am-5pm) °Most Wednesdays (8:30-5pm) °$30 per visit, cash only  °[facility] ° [address], [place] (336) 641-4533 Patients are seen by appointment only. Walk-ins are not accepted. [facility] see patients 18 years of age and older. °One   Wednesday Evening (Monthly: Volunteer Based).  $30 per visit, cash only  °[facility]  (919) 537-3737 for adults; Children under age 4, call Graduate Pediatric Dentistry at ([phone]) [phone]. Children aged 4-14, please call (919) [phone] to request a pediatric application. ° Dental services are provided in all areas of dental care including fillings, crowns and bridges, complete and partial dentures, implants, gum treatment, root canals, and extractions. Preventive care is also provided. Treatment is provided to both adults and children. °Patients are selected via a lottery and there is often a waiting list. °  °[facility] [address], °[place] ° (336) 763-8833 [email or web address] °  °[facility] [address], [place], [place] (336)723-1848, Ext. 123 Second and Fourth Thursday of each month, opens at 6:30 AM; Clinic ends at 9 AM.  Patients are seen on a first-come first-served basis, and a limited number are seen during each clinic.  ° °[facility] ° [address], [place], [place] (336) 723-7904    Eligibility Requirements °You must have lived in [place], or [place] counties for at least the last three months. °  You cannot be eligible for state or federal sponsored healthcare insurance, including Veterans Administration, Medicaid, or Medicare. °  You generally cannot be eligible for healthcare insurance through your employer.  °  How to apply: °Eligibility screenings are held every Tuesday and Wednesday afternoon from 1:00 pm until 4:00 pm. You do not need an appointment for the interview!  °[facility] [address], [place], [place] 336-631-2330   °[facility] [facility]  336-342-8273   °[facility]  336-703-3100   °[facility]  336-570-6415   ° °Behavioral Health Resources in the Community: °Intensive Outpatient Programs °Organization         Address  Phone  Notes  °[facility] [address], [place], [place] 336-878-6098   °[facility] [address], [place], [place] 336-832-9800   °[facility] [address], [place], [place] ° 336-882-2125   °[facility] [address],  °[place], [place] 1-800-853-5163 or 336-641-4981   °Substance Abuse Resources °Organization         Address  Phone  Notes  °[facility]  336-882-2125   °[facility]  336-784-9470   °[facility]  336-285-9073   °[facility]  336-845-3988   °Residential & Outpatient Substance Abuse Program  1-800-659-3381   °Psychological Services °Organization         Address  Phone  Notes  °[facility]  336- 832-9600   °[facility]  336- 378-7881   °[facility] [address], [place] 1-800-853-5163 or 336-641-4981   ° °Mobile Crisis Teams °Organization         Address  Phone  Notes  °[facility], Mobile Crisis Care Unit  1-877-626-1772   °Assertive °[facility] ° [address]  [place], [place] 336-834-9664   °Sharon DeEsch [address] °[place] 336-554-5454   ° °Self-Help/Support Groups °Organization         Address  Phone             Notes  °[facility] - variety of support groups  336- 373-1402 Call for more information  °Narcotics Anonymous (NA), [facility] [address], °[place]  2 meetings at this location  ° °  Residential Treatment Programs °Organization         Address  Phone  Notes  °[facility] [address],    °[place]  1-866-801-8205   °[facility] ° [address], [place], [place] 704-293-8524   °[facility] [address], [place] 336-845-3988 Admissions: 8am-3pm M-F  °[facility] [address]    °[place], [place] 336-841-1104   °[facility] [address], [place], [place] 336-379-7146   °[facility] [address]  °[place], [place] 336-285-9073   °[facility] - Intensive Outpatient [address], [place], [place] 336-852-3033   °[facility] [address]  °[place], [place] 1-877-615-2722 or 336-784-9470   °[facility] [address] [place], [place] 336-227-7417 Accepts Medicaid  °[facility] [address]  °[place] 1-800-659-3381 Substance Abuse/Addiction Treatment  ° °[place] Behavioral Health Resources °Organization         Address  Phone  Notes  °[facility]  (888) 581-9988   °Julie Brannon, PhD [address] [place], [place]   (336) 349-5553 or (336) 951-0000   °[facility]   [address] °[place], [place] (336) 349-4454   °[facility] [address], [place], [place] (336) 342-8316 Insurance/Medicaid/sponsorship through [facility]  °[facility] [address]                                    [place], [place] (336) 342-8316 Therapy/tele-psych/case    °[facility] [address]  ° [place], [place] (336) 349-2233    °Dr. Arfeen  (336) 349-4544   °[facility] [place]  [employer] [place] [facility] 1) [address], [place] °2) [address], [place] °3)  [address], [place] (336) 349-3220 °(336) 342-7768 ° °(336) 342-8140   °[place] Child Abuse [facility] (336) 342-1394 or (336) 342-3537 (After Hours)    ° ° ° °Take the prescription as directed.  Call your regular medical doctor on Monday to schedule a follow up appointment within the next 2 days.  Return to the Emergency Department immediately sooner if worsening.  ° °

## 2014-11-05 NOTE — ED Notes (Signed)
Painful urination that started last night. No blood,just pain. Last dialysis treatment Friday.

## 2014-11-08 ENCOUNTER — Emergency Department (HOSPITAL_COMMUNITY)
Admission: EM | Admit: 2014-11-08 | Discharge: 2014-11-08 | Disposition: A | Discharge status: Home / Self Care | Payer: Medicare Other | Attending: Emergency Medicine | Admitting: Emergency Medicine

## 2014-11-08 ENCOUNTER — Encounter (HOSPITAL_COMMUNITY): Payer: Self-pay | Admitting: Emergency Medicine

## 2014-11-08 DIAGNOSIS — I120 Hypertensive chronic kidney disease with stage 5 chronic kidney disease or end stage renal disease: Secondary | ICD-10-CM | POA: Insufficient documentation

## 2014-11-08 DIAGNOSIS — E663 Overweight: Secondary | ICD-10-CM | POA: Diagnosis not present

## 2014-11-08 DIAGNOSIS — J45909 Unspecified asthma, uncomplicated: Secondary | ICD-10-CM | POA: Insufficient documentation

## 2014-11-08 DIAGNOSIS — Z9889 Other specified postprocedural states: Secondary | ICD-10-CM | POA: Diagnosis not present

## 2014-11-08 DIAGNOSIS — Z992 Dependence on renal dialysis: Secondary | ICD-10-CM | POA: Diagnosis not present

## 2014-11-08 DIAGNOSIS — Z792 Long term (current) use of antibiotics: Secondary | ICD-10-CM | POA: Insufficient documentation

## 2014-11-08 DIAGNOSIS — M1990 Unspecified osteoarthritis, unspecified site: Secondary | ICD-10-CM | POA: Diagnosis not present

## 2014-11-08 DIAGNOSIS — M109 Gout, unspecified: Secondary | ICD-10-CM | POA: Insufficient documentation

## 2014-11-08 DIAGNOSIS — N186 End stage renal disease: Secondary | ICD-10-CM | POA: Insufficient documentation

## 2014-11-08 DIAGNOSIS — Z794 Long term (current) use of insulin: Secondary | ICD-10-CM | POA: Diagnosis not present

## 2014-11-08 DIAGNOSIS — Z87891 Personal history of nicotine dependence: Secondary | ICD-10-CM | POA: Diagnosis not present

## 2014-11-08 DIAGNOSIS — Z8673 Personal history of transient ischemic attack (TIA), and cerebral infarction without residual deficits: Secondary | ICD-10-CM | POA: Diagnosis not present

## 2014-11-08 DIAGNOSIS — Z8719 Personal history of other diseases of the digestive system: Secondary | ICD-10-CM | POA: Diagnosis not present

## 2014-11-08 DIAGNOSIS — E119 Type 2 diabetes mellitus without complications: Secondary | ICD-10-CM | POA: Diagnosis not present

## 2014-11-08 DIAGNOSIS — N390 Urinary tract infection, site not specified: Secondary | ICD-10-CM

## 2014-11-08 DIAGNOSIS — Z7982 Long term (current) use of aspirin: Secondary | ICD-10-CM | POA: Insufficient documentation

## 2014-11-08 DIAGNOSIS — R32 Unspecified urinary incontinence: Secondary | ICD-10-CM

## 2014-11-08 DIAGNOSIS — Z951 Presence of aortocoronary bypass graft: Secondary | ICD-10-CM | POA: Insufficient documentation

## 2014-11-08 DIAGNOSIS — I2510 Atherosclerotic heart disease of native coronary artery without angina pectoris: Secondary | ICD-10-CM | POA: Diagnosis not present

## 2014-11-08 DIAGNOSIS — Z79899 Other long term (current) drug therapy: Secondary | ICD-10-CM | POA: Diagnosis not present

## 2014-11-08 DIAGNOSIS — R109 Unspecified abdominal pain: Secondary | ICD-10-CM | POA: Diagnosis present

## 2014-11-08 LAB — URINE CULTURE

## 2014-11-08 LAB — COMPREHENSIVE METABOLIC PANEL
ALBUMIN: 2.2 g/dL — AB (ref 3.5–5.0)
ALK PHOS: 59 U/L (ref 38–126)
ALT: 9 U/L — AB (ref 17–63)
AST: 16 U/L (ref 15–41)
Anion gap: 11 (ref 5–15)
BUN: 72 mg/dL — ABNORMAL HIGH (ref 6–20)
CALCIUM: 7.4 mg/dL — AB (ref 8.9–10.3)
CHLORIDE: 88 mmol/L — AB (ref 101–111)
CO2: 26 mmol/L (ref 22–32)
CREATININE: 7.61 mg/dL — AB (ref 0.61–1.24)
GFR calc Af Amer: 7 mL/min — ABNORMAL LOW (ref >60)
GFR calc non Af Amer: 6 mL/min — ABNORMAL LOW (ref >60)
GLUCOSE: 167 mg/dL — AB (ref 65–99)
Potassium: 3.1 mmol/L — ABNORMAL LOW (ref 3.5–5.1)
SODIUM: 125 mmol/L — AB (ref 135–145)
Total Bilirubin: 0.7 mg/dL (ref 0.3–1.2)
Total Protein: 6.1 g/dL — ABNORMAL LOW (ref 6.5–8.1)

## 2014-11-08 LAB — CBC
HCT: 25.7 % — ABNORMAL LOW (ref 39.0–52.0)
HEMOGLOBIN: 9 g/dL — AB (ref 13.0–17.0)
MCH: 32 pg (ref 26.0–34.0)
MCHC: 35 g/dL (ref 30.0–36.0)
MCV: 91.5 fL (ref 78.0–100.0)
PLATELETS: 215 10*3/uL (ref 150–400)
RBC: 2.81 MIL/uL — AB (ref 4.22–5.81)
RDW: 13.4 % (ref 11.5–15.5)
WBC: 11 10*3/uL — ABNORMAL HIGH (ref 4.0–10.5)

## 2014-11-08 LAB — I-STAT CG4 LACTIC ACID, ED: LACTIC ACID, VENOUS: 1.3 mmol/L (ref 0.5–2.0)

## 2014-11-08 MED ORDER — OXYBUTYNIN CHLORIDE 5 MG PO TABS: 5.0000 mg | ORAL_TABLET | Freq: Three times a day (TID) | ORAL | Status: DC

## 2014-11-08 MED ORDER — SODIUM CHLORIDE 0.9 % IV SOLN
INTRAVENOUS | Status: DC
  Administered 2014-11-08: 17:00:00 via INTRAVENOUS

## 2014-11-08 NOTE — Discharge Instructions (Signed)
Urinary Tract Infection A urinary tract infection (UTI) can occur any place along the urinary tract. The tract includes the kidneys, ureters, bladder, and urethra. A type of germ called bacteria often causes a UTI. UTIs are often helped with antibiotic medicine.  HOME CARE   If given, take antibiotics as told by your doctor. Finish them even if you start to feel better.  Drink enough fluids to keep your pee (urine) clear or pale yellow.  Avoid tea, drinks with caffeine, and bubbly (carbonated) drinks.  Pee often. Avoid holding your pee in for a long time.  Pee before and after having sex (intercourse).  Wipe from front to back after you poop (bowel movement) if you are a woman. Use each tissue only once. GET HELP RIGHT AWAY IF:  1. You have back pain. 2. You have lower belly (abdominal) pain. 3. You have chills. 4. You feel sick to your stomach (nauseous). 5. You throw up (vomit). 6. Your burning or discomfort with peeing does not go away. 7. You have a fever. 8. Your symptoms are not better in 3 days. MAKE SURE YOU:   Understand these instructions.  Alan [name] watch your condition.  Alan [name] get help right away if you are not doing well or get worse. Document Released: 07/24/2007 Document Revised: 10/30/2011 Document Reviewed: 09/05/2011 [facility] Patient Information 2015 [place], [place]. This information is not intended to replace advice given to you by your health care provider. Make sure you discuss any questions you have with your health care provider.  Urinary Incontinence Urinary incontinence is the involuntary loss of urine from your bladder. CAUSES  There are many causes of urinary incontinence. They include:  Medicines.  Infections.  Prostatic enlargement, leading to overflow of urine from your bladder.  Surgery.  Neurological diseases.  Emotional factors. SIGNS AND SYMPTOMS Urinary Incontinence can be divided into four types: 9. Urge incontinence. Urge incontinence  is the involuntary loss of urine before you have the opportunity to go to the bathroom. There is a sudden urge to void but not enough time to reach a bathroom. 10. Stress incontinence. Stress incontinence is the sudden loss of urine with any activity that forces urine to pass. It is commonly caused by anatomical changes to the pelvis and sphincter areas of your body. 11. Overflow incontinence. Overflow incontinence is the loss of urine from an obstructed opening to your bladder. This results in a backup of urine and a resultant buildup of pressure within the bladder. When the pressure within the bladder exceeds the closing pressure of the sphincter, the urine overflows, which causes incontinence, similar to water overflowing a dam. 12. Total incontinence. Total incontinence is the loss of urine as a result of the inability to store urine within your bladder. DIAGNOSIS  Evaluating the cause of incontinence may require:  A thorough and complete medical and obstetric history.  A complete physical exam.  Laboratory tests such as a urine culture and sensitivities. When additional tests are indicated, they can include:  An ultrasound exam.  Kidney and bladder X-rays.  Cystoscopy. This is an exam of the bladder using a narrow scope.  Urodynamic testing to test the nerve function to the bladder and sphincter areas. TREATMENT  Treatment for urinary incontinence depends on the cause:  For urge incontinence caused by a bacterial infection, antibiotics Alan [name] be prescribed. If the urge incontinence is related to medicines you take, your health care provider may have you change the medicine.  For stress incontinence, surgery to re-establish anatomical  support to the bladder or sphincter, or both, Alan [name] often correct the condition.  For overflow incontinence caused by an enlarged prostate, an operation to open the channel through the enlarged prostate Alan [name] allow the flow of urine out of the bladder. In  women with fibroids, a hysterectomy may be recommended.  For total incontinence, surgery on your urinary sphincter may help. An artificial urinary sphincter (an inflatable cuff placed around the urethra) may be required. In women who have developed a hole-like passage between their bladder and vagina (vesicovaginal fistula), surgery to close the fistula often is required. HOME CARE INSTRUCTIONS  Normal daily hygiene and the use of pads or adult diapers that are changed regularly Alan [name] help prevent odors and skin damage.  Avoid caffeine. It can overstimulate your bladder.  Use the bathroom regularly. Try about every 2-3 hours to go to the bathroom, even if you do not feel the need to do so. Take time to empty your bladder completely. After urinating, wait a minute. Then try to urinate again.  For causes involving nerve dysfunction, keep a log of the medicines you take and a journal of the times you go to the bathroom. SEEK MEDICAL CARE IF:  You experience worsening of pain instead of improvement in pain after your procedure.  Your incontinence becomes worse instead of better. SEE IMMEDIATE MEDICAL CARE IF:  You experience fever or shaking chills.  You are unable to pass your urine.  You have redness spreading into your groin or down into your thighs. MAKE SURE YOU:   Understand these instructions.   Alan [name] watch your condition.  Alan [name] get help right away if you are not doing well or get worse. Document Released: 03/14/2004 Document Revised: 11/25/2012 Document Reviewed: 07/14/2012 [name] Patient Information 2015 [name], [place]. This information is not intended to replace advice given to you by your health care provider. Make sure you discuss any questions you have with your health care provider.

## 2014-11-08 NOTE — ED Notes (Addendum)
Pt reports seen for same on Saturday and diagnosed with a UTI. Pt reports continued increased frequency of urination, pain in groin and urinary incontinence. Pt family reports low grade fever at night. Last dose of tylenol 830 this am. Pt currently afebrile.

## 2014-11-08 NOTE — ED Provider Notes (Signed)
CSN: [id number]     Arrival date & time 11/08/14  1317 History   First MD Initiated Contact with Patient 11/08/14 1526     Chief Complaint  Patient presents with  . Abdominal Pain     (Consider location/radiation/quality/duration/timing/severity/associated sxs/prior Treatment) HPI   Alan [name] is a 74 y.o. male who presents for evaluation of urinary incontinence. Symptoms worsening, despite being treated with Keflex for UTI for 3 days. He has anorexia without vomiting or diarrhea. He has dysuria when he urinates. No prior recent UTIs. He skipped dialysis yesterday because he was not feeling well. He denies cough, shortness of breath, weakness or dizziness. There are no other known modifying factors.   Past Medical History  Diagnosis Date  . Diabetes mellitus   . Hypertension   . Gout   . Coronary artery disease   . Stroke   . Asthma   . Renal insufficiency   . Hypercholesteremia   . Arthritis   . DDD (degenerative disc disease), lumbar   . DDD (degenerative disc disease), cervical   . Gangrene of toe Feb. 2015    Right  great    . Gastric ulcer     EGD 2014  . Thrombosis of renal dialysis arteriovenous graft   . Clotted renal dialysis arteriovenous graft    Past Surgical History  Procedure Laterality Date  . Coronary artery bypass graft    . Below knee leg amputation Bilateral   . Esophagogastroduodenoscopy Left 05/24/2012    [id number] dilated baggy but otherwise a normal/ Small hiatal hernia. Gastric ulcer -S/P biopsy  . Amputation Right 04/10/2013    Procedure: Right Below Knee Amputation;  Surgeon: Newt Minion, MD;  Location: [facility];  Service: Orthopedics;  Laterality: Right;  Right Below Knee Amputation  . Appendectomy    . Peripheral vascular catheterization N/A 08/09/2014    Procedure: A/V Shuntogram/Fistulagram;  Surgeon: Katha Cabal, MD;  Location: [facility];  Service: Cardiovascular;  Laterality: N/A;  . Peripheral vascular  catheterization Left 08/09/2014    Procedure: A/V Shunt Intervention;  Surgeon: Katha Cabal, MD;  Location: [facility];  Service: Cardiovascular;  Laterality: Left;   Family History  Problem Relation Age of Onset  . Colon cancer Neg Hx    Social History  Substance Use Topics  . Smoking status: Former Smoker    Quit date: 08/08/2004  . Smokeless tobacco: Never Used  . Alcohol Use: No    Review of Systems  All other systems reviewed and are negative.     Allergies  Codeine and Yellow jacket venom  Home Medications   Prior to Admission medications   Medication Sig Start Date End Date Taking? Authorizing Deane Wattenbarger  acetaminophen (TYLENOL) 500 MG tablet Take 500 mg by mouth 2 (two) times daily.   Yes Historical Lexx Monte, MD  Amino Acid Infusion (PROSOL) 20 % SOLN every Monday, Wednesday, and Friday.  10/27/14  Yes Historical Maranda Marte, MD  aspirin EC 81 MG tablet Take 81 mg by mouth at bedtime.    Yes Historical Hawthorne Day, MD  B Complex-C-Folic Acid (NEPHRO-VITE PO) Take 1 tablet by mouth every evening.    Yes Historical Branden Shallenberger, MD  cephALEXin (KEFLEX) 500 MG capsule Take 1 capsule (500 mg total) by mouth 4 (four) times daily. 11/05/14  Yes Francine Graven, DO  cinacalcet (SENSIPAR) 30 MG tablet Take 30 mg by mouth daily.   Yes Historical Dominque Marlin, MD  colchicine 0.6 MG tablet Take 0.6 mg by  mouth daily.   Yes Historical Nisreen Guise, MD  docusate sodium (COLACE) 100 MG capsule Take 100 mg by mouth daily as needed for mild constipation.   Yes Historical Nida Manfredi, MD  fluticasone (FLONASE) 50 MCG/ACT nasal spray Place 2 sprays into both nostrils daily as needed for allergies.    Yes Historical Jaimey Franchini, MD  furosemide (LASIX) 40 MG tablet Take 20 mg by mouth every evening.   Yes Historical Griffen Frayne, MD  lactose free nutrition (BOOST) LIQD Take 237 mLs by mouth daily.   Yes Historical Gaylyn Berish, MD  LANTUS SOLOSTAR 100 UNIT/ML Solostar Pen Inject 6 Units into the skin at  bedtime. 12/21/13  Yes Historical Cayli Escajeda, MD  nitroGLYCERIN (NITROSTAT) 0.4 MG SL tablet Place 0.4 mg under the tongue every 5 (five) minutes as needed for chest pain.    Yes Historical Carnita Golob, MD  omeprazole (PRILOSEC) 20 MG capsule Take 1 capsule (20 mg total) by mouth 2 (two) times daily before a meal. 06/17/13  Yes Radene Gunning, NP  sevelamer carbonate (RENVELA) 800 MG tablet Take 800-1,600 mg by mouth 3 (three) times daily with meals. Patient takes 2 tablets with meals and 1 tablet with snack   Yes Historical Merry Pond, MD  vitamin C (ASCORBIC ACID) 500 MG tablet Take 500 mg by mouth daily.   Yes Historical Alani Lacivita, MD   BP 167/66 mmHg  Pulse 69  Temp(Src) 97.7 F (36.5 C) (Oral)  Resp 18  Ht 5\' 9"  (1.753 m)  Wt 175 lb (79.379 kg)  BMI 25.83 kg/m2  SpO2 99% Physical Exam  Constitutional: He is oriented to person, place, and time. He appears well-developed. No distress.  Elderly, overweight  HENT:  Head: Normocephalic and atraumatic.  Right Ear: External ear normal.  Left Ear: External ear normal.  Eyes: Conjunctivae and EOM are normal. Pupils are equal, round, and reactive to light.  Neck: Normal range of motion and phonation normal. Neck supple.  Cardiovascular: Normal rate, regular rhythm and normal heart sounds.   Vascular fistula, right upper arm, with normal thrill.  Pulmonary/Chest: Effort normal and breath sounds normal. No respiratory distress. He exhibits no bony tenderness.  Abdominal: Soft. He exhibits no distension. There is no tenderness. There is no guarding.  Musculoskeletal: Normal range of motion.  Neurological: He is alert and oriented to person, place, and time. No cranial nerve deficit or sensory deficit. He exhibits normal muscle tone. Coordination normal.  Skin: Skin is warm, dry and intact.  Psychiatric: He has a normal mood and affect. His behavior is normal. Judgment and thought content normal.  Nursing note and vitals reviewed.   ED Course   Procedures (including critical care time)  Medications  0.9 %  sodium chloride infusion (not administered)    Patient Vitals for the past 24 hrs:  BP Temp Temp src Pulse Resp SpO2 Height Weight  11/08/14 1514 167/66 mmHg - - 69 - 99 % - -  11/08/14 1325 139/60 mmHg 97.7 F (36.5 C) Oral 72 18 100 % 5\' 9"  (1.753 m) 175 lb (79.379 kg)    6:45 PM Reevaluation with update and discussion. After initial assessment and treatment, an updated evaluation reveals he is comfortable now and has no additional complaints. He feels like the urinary incontinence  has improved since being here. Findings discussed with patient and family members, all questions were answered.Daleen Bo L    Labs Review Labs Reviewed  COMPREHENSIVE METABOLIC PANEL - Abnormal; Notable for the following:    Sodium 125 (*)  Potassium 3.1 (*)    Chloride 88 (*)    Glucose, Bld 167 (*)    BUN 72 (*)    Creatinine, Ser 7.61 (*)    Calcium 7.4 (*)    Total Protein 6.1 (*)    Albumin 2.2 (*)    ALT 9 (*)    GFR calc non Af Amer 6 (*)    GFR calc Af Amer 7 (*)    All other components within normal limits  CBC - Abnormal; Notable for the following:    WBC 11.0 (*)    RBC 2.81 (*)    Hemoglobin 9.0 (*)    HCT 25.7 (*)    All other components within normal limits  CULTURE, BLOOD (ROUTINE X 2)  CULTURE, BLOOD (ROUTINE X 2)  I-STAT CG4 LACTIC ACID, ED    Imaging Review No results found. I have personally reviewed and evaluated these images and lab results as part of my medical decision-making.   EKG Interpretation None      MDM   Final diagnoses:  Urinary tract infection without hematuria, site unspecified  Urinary incontinence, unspecified incontinence type   UTI with bladder spasm symptoms and dysuria. 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 Screening blood cultures were ordered and sent.  Doubt serious bacterial infection. Metabolic instability or impending vascular collapse.  Nursing Notes Reviewed/ Care Coordinated Applicable Imaging Reviewed Interpretation of Laboratory Data incorporated into ED treatment  The patient appears reasonably screened and/or stabilized for discharge and I doubt any other medical condition or other [facility] requiring further screening, evaluation, or treatment in the ED at this time prior to discharge.  Plan: Home Medications- Oxybutinin; Home Treatments- rest; return here if the recommended treatment, does not improve the symptoms; Recommended follow up- Urology 1 week    Daleen Bo, MD 11/08/14 [phone]

## 2014-11-09 ENCOUNTER — Telehealth (HOSPITAL_BASED_OUTPATIENT_CLINIC_OR_DEPARTMENT_OTHER): Payer: Self-pay | Admitting: Emergency Medicine

## 2014-11-09 NOTE — Telephone Encounter (Signed)
Post ED Visit - Positive Culture Follow-up  Culture report reviewed by antimicrobial stewardship pharmacist:  []  Heide Guile, Pharm.D., BCPS []  Alycia Rossetti, Pharm.D., BCPS []  [place], Pharm.D., BCPS, AAHIVP []  Legrand Como, Pharm.D., BCPS, AAHIVP [x]  Joya San, Pharm.D. []  Cassie [place], [place].D.  Positive urine culture klebsiella Treated with cephalexin, organism sensitive to the same and no further patient follow-up is required at this time.  Hazle Nordmann 11/09/2014, 10:36 AM

## 2014-11-13 LAB — CULTURE, BLOOD (ROUTINE X 2)
CULTURE: NO GROWTH
Culture: NO GROWTH

## 2014-11-16 ENCOUNTER — Emergency Department (HOSPITAL_COMMUNITY): Payer: Medicare Other

## 2014-11-16 ENCOUNTER — Observation Stay (HOSPITAL_COMMUNITY)
Admission: EM | Admit: 2014-11-16 | Discharge: 2014-11-18 | Disposition: A | Discharge status: Home / Self Care | Payer: Medicare Other | Attending: Family Medicine | Admitting: Family Medicine

## 2014-11-16 ENCOUNTER — Encounter (HOSPITAL_COMMUNITY): Payer: Self-pay | Admitting: *Deleted

## 2014-11-16 DIAGNOSIS — E780 Pure hypercholesterolemia: Secondary | ICD-10-CM | POA: Diagnosis not present

## 2014-11-16 DIAGNOSIS — J45909 Unspecified asthma, uncomplicated: Secondary | ICD-10-CM | POA: Diagnosis not present

## 2014-11-16 DIAGNOSIS — Z87891 Personal history of nicotine dependence: Secondary | ICD-10-CM | POA: Insufficient documentation

## 2014-11-16 DIAGNOSIS — Z7982 Long term (current) use of aspirin: Secondary | ICD-10-CM | POA: Insufficient documentation

## 2014-11-16 DIAGNOSIS — R079 Chest pain, unspecified: Secondary | ICD-10-CM | POA: Diagnosis present

## 2014-11-16 DIAGNOSIS — M1990 Unspecified osteoarthritis, unspecified site: Secondary | ICD-10-CM | POA: Insufficient documentation

## 2014-11-16 DIAGNOSIS — K259 Gastric ulcer, unspecified as acute or chronic, without hemorrhage or perforation: Secondary | ICD-10-CM | POA: Insufficient documentation

## 2014-11-16 DIAGNOSIS — E119 Type 2 diabetes mellitus without complications: Secondary | ICD-10-CM | POA: Diagnosis not present

## 2014-11-16 DIAGNOSIS — Z79899 Other long term (current) drug therapy: Secondary | ICD-10-CM | POA: Diagnosis not present

## 2014-11-16 DIAGNOSIS — E1159 Type 2 diabetes mellitus with other circulatory complications: Secondary | ICD-10-CM | POA: Diagnosis present

## 2014-11-16 DIAGNOSIS — I2510 Atherosclerotic heart disease of native coronary artery without angina pectoris: Secondary | ICD-10-CM | POA: Diagnosis not present

## 2014-11-16 DIAGNOSIS — I120 Hypertensive chronic kidney disease with stage 5 chronic kidney disease or end stage renal disease: Secondary | ICD-10-CM | POA: Diagnosis not present

## 2014-11-16 DIAGNOSIS — E1151 Type 2 diabetes mellitus with diabetic peripheral angiopathy without gangrene: Secondary | ICD-10-CM

## 2014-11-16 DIAGNOSIS — E876 Hypokalemia: Secondary | ICD-10-CM | POA: Insufficient documentation

## 2014-11-16 DIAGNOSIS — Z992 Dependence on renal dialysis: Secondary | ICD-10-CM | POA: Diagnosis not present

## 2014-11-16 DIAGNOSIS — R0789 Other chest pain: Secondary | ICD-10-CM | POA: Diagnosis not present

## 2014-11-16 DIAGNOSIS — N186 End stage renal disease: Secondary | ICD-10-CM | POA: Diagnosis not present

## 2014-11-16 DIAGNOSIS — M5030 Other cervical disc degeneration, unspecified cervical region: Secondary | ICD-10-CM | POA: Diagnosis not present

## 2014-11-16 DIAGNOSIS — Z8673 Personal history of transient ischemic attack (TIA), and cerebral infarction without residual deficits: Secondary | ICD-10-CM | POA: Insufficient documentation

## 2014-11-16 DIAGNOSIS — Z89519 Acquired absence of unspecified leg below knee: Secondary | ICD-10-CM

## 2014-11-16 HISTORY — DX: Cerebral infarction, unspecified: I63.9

## 2014-11-16 HISTORY — DX: Dependence on renal dialysis: Z99.2

## 2014-11-16 HISTORY — DX: End stage renal disease: N18.6

## 2014-11-16 LAB — BASIC METABOLIC PANEL
Anion gap: 12 (ref 5–15)
BUN: 36 mg/dL — AB (ref 6–20)
CHLORIDE: 93 mmol/L — AB (ref 101–111)
CO2: 26 mmol/L (ref 22–32)
Calcium: 7.2 mg/dL — ABNORMAL LOW (ref 8.9–10.3)
Creatinine, Ser: 3.87 mg/dL — ABNORMAL HIGH (ref 0.61–1.24)
GFR calc Af Amer: 16 mL/min — ABNORMAL LOW (ref >60)
GFR calc non Af Amer: 14 mL/min — ABNORMAL LOW (ref >60)
GLUCOSE: 124 mg/dL — AB (ref 65–99)
POTASSIUM: 2.7 mmol/L — AB (ref 3.5–5.1)
Sodium: 131 mmol/L — ABNORMAL LOW (ref 135–145)

## 2014-11-16 LAB — CBC
HCT: 25.1 % — ABNORMAL LOW (ref 39.0–52.0)
HEMOGLOBIN: 8.6 g/dL — AB (ref 13.0–17.0)
MCH: 32.1 pg (ref 26.0–34.0)
MCHC: 34.3 g/dL (ref 30.0–36.0)
MCV: 93.7 fL (ref 78.0–100.0)
Platelets: 319 10*3/uL (ref 150–400)
RBC: 2.68 MIL/uL — AB (ref 4.22–5.81)
RDW: 13.6 % (ref 11.5–15.5)
WBC: 12.3 10*3/uL — ABNORMAL HIGH (ref 4.0–10.5)

## 2014-11-16 LAB — TROPONIN I
TROPONIN I: 0.03 ng/mL (ref <0.031)
Troponin I: 0.03 ng/mL (ref <0.031)

## 2014-11-16 MED ORDER — INSULIN GLARGINE 100 UNIT/ML ~~LOC~~ SOLN
6.0000 [IU] | Freq: Every day | SUBCUTANEOUS | Status: DC
  Administered 2014-11-16 – 2014-11-17 (×2): 6 [IU] via SUBCUTANEOUS
  Filled 2014-11-16 (×5): qty 0.06

## 2014-11-16 MED ORDER — ASPIRIN EC 81 MG PO TBEC
81.0000 mg | DELAYED_RELEASE_TABLET | Freq: Every day | ORAL | Status: DC
  Administered 2014-11-17: 81 mg via ORAL
  Filled 2014-11-16: qty 1

## 2014-11-16 MED ORDER — ACETAMINOPHEN 325 MG PO TABS: 650.0000 mg | ORAL_TABLET | Freq: Four times a day (QID) | ORAL | Status: DC | PRN

## 2014-11-16 MED ORDER — POTASSIUM CHLORIDE CRYS ER 20 MEQ PO TBCR
40.0000 meq | EXTENDED_RELEASE_TABLET | Freq: Once | ORAL | Status: AC
  Administered 2014-11-16: 40 meq via ORAL
  Filled 2014-11-16: qty 2

## 2014-11-16 MED ORDER — CINACALCET HCL 30 MG PO TABS
30.0000 mg | ORAL_TABLET | Freq: Every day | ORAL | Status: DC
  Filled 2014-11-16 (×5): qty 1

## 2014-11-16 MED ORDER — ACETAMINOPHEN 325 MG PO TABS
650.0000 mg | ORAL_TABLET | ORAL | Status: DC | PRN
  Administered 2014-11-17: 650 mg via ORAL
  Filled 2014-11-16: qty 2

## 2014-11-16 MED ORDER — NITROGLYCERIN 0.4 MG SL SUBL: 0.4000 mg | SUBLINGUAL_TABLET | SUBLINGUAL | Status: DC | PRN

## 2014-11-16 MED ORDER — SEVELAMER CARBONATE 800 MG PO TABS
1600.0000 mg | ORAL_TABLET | Freq: Three times a day (TID) | ORAL | Status: DC
  Administered 2014-11-17 – 2014-11-18 (×3): 1600 mg via ORAL
  Filled 2014-11-16 (×3): qty 2

## 2014-11-16 MED ORDER — SEVELAMER CARBONATE 800 MG PO TABS: 800.0000 mg | ORAL_TABLET | Freq: Three times a day (TID) | ORAL | Status: DC

## 2014-11-16 MED ORDER — ONDANSETRON HCL 4 MG/2ML IJ SOLN: 4.0000 mg | Freq: Four times a day (QID) | INTRAMUSCULAR | Status: DC | PRN

## 2014-11-16 MED ORDER — FENTANYL CITRATE (PF) 100 MCG/2ML IJ SOLN
50.0000 ug | INTRAMUSCULAR | Status: DC | PRN
  Administered 2014-11-16 (×2): 50 ug via INTRAVENOUS
  Filled 2014-11-16 (×2): qty 2

## 2014-11-16 MED ORDER — HEPARIN SODIUM (PORCINE) 5000 UNIT/ML IJ SOLN
5000.0000 [IU] | Freq: Three times a day (TID) | INTRAMUSCULAR | Status: DC
  Administered 2014-11-16 – 2014-11-18 (×6): 5000 [IU] via SUBCUTANEOUS
  Filled 2014-11-16 (×6): qty 1

## 2014-11-16 MED ORDER — CEPHALEXIN 500 MG PO CAPS
500.0000 mg | ORAL_CAPSULE | Freq: Four times a day (QID) | ORAL | Status: DC
  Administered 2014-11-16 – 2014-11-18 (×5): 500 mg via ORAL
  Filled 2014-11-16 (×5): qty 1

## 2014-11-16 MED ORDER — SEVELAMER CARBONATE 800 MG PO TABS: 800.0000 mg | ORAL_TABLET | ORAL | Status: DC | PRN

## 2014-11-16 MED ORDER — PANTOPRAZOLE SODIUM 40 MG PO TBEC
40.0000 mg | DELAYED_RELEASE_TABLET | Freq: Two times a day (BID) | ORAL | Status: DC
  Administered 2014-11-16 – 2014-11-17 (×3): 40 mg via ORAL
  Filled 2014-11-16 (×3): qty 1

## 2014-11-16 NOTE — ED Provider Notes (Signed)
CSN: [id number]     Arrival date & time 11/16/14  1128 History  By signing my name below, I, Alan [name], attest that this documentation has been prepared under the direction and in the presence of Alan Morrison, MD. Electronically Signed: Stephania [name], ED Scribe. 11/16/2014. 12:38 PM.     Chief Complaint  Patient presents with  . Chest Pain   The history is provided by the patient. No language interpreter was used.    HPI Comments: Alan [name] is a 74 y.o. male with a history of , DM, HTN, CAD, CVA, HLD, renal insufficiency currently on dialysis, and bilateral BKA, brought in by ambulance to the Emergency Department complaining of sudden-onset, constant, aching, nonradiating, improving chest pain that started 1 hour ago in the middle of dialysis. He states the pain was severe at onset but currently rates it as mild. Patient was given NTG en route, per nursing notes. Nothing makes his pain better. He denies a history of MI. Patient states he has not had a stress test for "a while." He denies abdominal pain, vomiting, diarrhea, or leg swelling.   Past Medical History  Diagnosis Date  . Diabetes mellitus   . Hypertension   . Gout   . Coronary artery disease   . Stroke   . Asthma   . Renal insufficiency   . Hypercholesteremia   . Arthritis   . DDD (degenerative disc disease), lumbar   . DDD (degenerative disc disease), cervical   . Gangrene of toe Feb. 2015    Right  great    . Gastric ulcer     EGD 2014  . Thrombosis of renal dialysis arteriovenous graft   . Clotted renal dialysis arteriovenous graft    Past Surgical History  Procedure Laterality Date  . Coronary artery bypass graft    . Below knee leg amputation Bilateral   . Esophagogastroduodenoscopy Left 05/24/2012    [id number] dilated baggy but otherwise a normal/ Small hiatal hernia. Gastric ulcer -S/P biopsy  . Amputation Right 04/10/2013    Procedure: Right Below Knee Amputation;  Surgeon: Newt Minion, MD;  Location: [facility];  Service: Orthopedics;  Laterality: Right;  Right Below Knee Amputation  . Appendectomy    . Peripheral vascular catheterization N/A 08/09/2014    Procedure: A/V Shuntogram/Fistulagram;  Surgeon: Katha Cabal, MD;  Location: [facility];  Service: Cardiovascular;  Laterality: N/A;  . Peripheral vascular catheterization Left 08/09/2014    Procedure: A/V Shunt Intervention;  Surgeon: Katha Cabal, MD;  Location: [facility];  Service: Cardiovascular;  Laterality: Left;   Family History  Problem Relation Age of Onset  . Colon cancer Neg Hx    Social History  Substance Use Topics  . Smoking status: Former Smoker    Quit date: 08/08/2004  . Smokeless tobacco: Never Used  . Alcohol Use: No    Review of Systems  Cardiovascular: Positive for chest pain. Negative for leg swelling.  Gastrointestinal: Negative for vomiting, abdominal pain and diarrhea.  All other systems reviewed and are negative.  Allergies  Codeine and Yellow jacket venom  Home Medications   Prior to Admission medications   Medication Sig Start Date End Date Taking? Authorizing Provider  acetaminophen (TYLENOL) 500 MG tablet Take 500 mg by mouth 2 (two) times daily.   Yes Historical Provider, MD  aspirin EC 81 MG tablet Take 81 mg by mouth at bedtime.    Yes Historical Provider, MD  B Complex-C-Folic Acid (  NEPHRO-VITE PO) Take 1 tablet by mouth every evening.    Yes Historical Provider, MD  cephALEXin (KEFLEX) 500 MG capsule Take 1 capsule (500 mg total) by mouth 4 (four) times daily. 11/05/14  Yes Francine Graven, DO  cinacalcet (SENSIPAR) 30 MG tablet Take 30 mg by mouth daily.   Yes Historical Provider, MD  colchicine 0.6 MG tablet Take 0.6 mg by mouth daily.   Yes Historical Provider, MD  furosemide (LASIX) 40 MG tablet Take 20 mg by mouth every evening.   Yes Historical Provider, MD  lactose free nutrition (BOOST) LIQD Take 237 mLs by mouth daily.   Yes Historical Provider, MD  LANTUS  SOLOSTAR 100 UNIT/ML Solostar Pen Inject 6 Units into the skin at bedtime. 12/21/13  Yes Historical Provider, MD  omeprazole (PRILOSEC) 20 MG capsule Take 1 capsule (20 mg total) by mouth 2 (two) times daily before a meal. 06/17/13  Yes Radene Gunning, NP  oxybutynin (DITROPAN) 5 MG tablet Take 1 tablet (5 mg total) by mouth 3 (three) times daily. 11/08/14  Yes Daleen Bo, MD  sevelamer carbonate (RENVELA) 800 MG tablet Take 800-1,600 mg by mouth 3 (three) times daily with meals. Patient takes 2 tablets with meals and 1 tablet with snack   Yes Historical Provider, MD  vitamin C (ASCORBIC ACID) 500 MG tablet Take 500 mg by mouth daily.   Yes Historical Provider, MD  Amino Acid Infusion (PROSOL) 20 % SOLN every Monday, Wednesday, and Friday.  10/27/14   Historical Provider, MD  docusate sodium (COLACE) 100 MG capsule Take 100 mg by mouth daily as needed for mild constipation.    Historical Provider, MD  fluticasone (FLONASE) 50 MCG/ACT nasal spray Place 2 sprays into both nostrils daily as needed for allergies.     Historical Provider, MD  nitroGLYCERIN (NITROSTAT) 0.4 MG SL tablet Place 0.4 mg under the tongue every 5 (five) minutes as needed for chest pain.     Historical Provider, MD   BP 150/66 mmHg  Pulse 72  Temp(Src) 97.6 F (36.4 C) (Oral)  Resp 14  SpO2 100% Physical Exam  Constitutional: He is oriented to person, place, and time. He appears well-developed and well-nourished. No distress.  HENT:  Head: Normocephalic and atraumatic.  Eyes: Conjunctivae and EOM are normal.  Neck: Neck supple. No tracheal deviation present.  Cardiovascular: Normal rate and regular rhythm.   Pulmonary/Chest: Effort normal. No respiratory distress.  Lungs are clear to auscultation bilaterally.   Abdominal: Soft. There is no tenderness.  Abdomen is soft, non-tender.   Musculoskeletal: Normal range of motion.  Neurological: He is alert and oriented to person, place, and time.  Skin: Skin is warm and dry.   Psychiatric: He has a normal mood and affect. His behavior is normal.  Nursing note and vitals reviewed.   ED Course  Procedures (including critical care time)  DIAGNOSTIC STUDIES: Oxygen Saturation is 99% on RA, normal by my interpretation.    COORDINATION OF CARE: 12:02 PM - Discussed treatment plan with pt at bedside which includes cardiac workup. Pt verbalized understanding and agreed to plan.   Labs Review Labs Reviewed  CBC - Abnormal; Notable for the following:    WBC 12.3 (*)    RBC 2.68 (*)    Hemoglobin 8.6 (*)    HCT 25.1 (*)    All other components within normal limits  BASIC METABOLIC PANEL - Abnormal; Notable for the following:    Sodium 131 (*)    Potassium 2.7 (*)  Chloride 93 (*)    Glucose, Bld 124 (*)    BUN 36 (*)    Creatinine, Ser 3.87 (*)    Calcium 7.2 (*)    GFR calc non Af Amer 14 (*)    GFR calc Af Amer 16 (*)    All other components within normal limits  TROPONIN I    Imaging Review Dg Chest 1 View  11/16/2014   CLINICAL DATA:  Sudden onset of left chest pain during dialysis today.  EXAM: CHEST 1 VIEW  COMPARISON:  01/14/2014  FINDINGS: Prior CABG. Mild cardiomegaly. Low lung volumes. Linear atelectasis or scarring in the left base. No focal opacities on the right. No effusions or acute bony abnormality.  IMPRESSION: Stable left basilar scarring or chronic atelectasis. Low lung volumes. Cardiomegaly.   Electronically Signed   By: Rolm Baptise M.D.   On: 11/16/2014 13:18   I have personally reviewed and evaluated these images and lab results as part of my medical decision-making.   EKG Interpretation   Date/Time:  Wednesday November 16 2014 11:35:07 EDT Ventricular Rate:  83 PR Interval:  167 QRS Duration: 89 QT Interval:  412 QTC Calculation: 484 R Axis:   7 Text Interpretation:  Sinus rhythm Anteroseptal infarct, old Minimal ST  depression, lateral leads Confirmed by ZAVITZ  MD, JOSHUA ([id number]) on  11/16/2014 12:56:53 PM      MDM    Final diagnoses:  ESRD (end stage renal disease) on dialysis  Acute chest pain  Hypokalemia   I personally performed the services described in this documentation, which was scribed in my presence. The recorded information has been reviewed and is accurate.  Patient presents after left-sided chest pain dialysis. Patient had aspirin and nitroglycerin with minimal relief. Pressure sensation. Mild shortness of breath. Vitals mild elevated blood pressure. Patient has known coronary artery disease, plan for telemetry observation the hospitalist with likely cardiac consult inpatient/in the morning.  The patients results and plan were reviewed and discussed.   Any x-rays performed were independently reviewed by myself.   Differential diagnosis were considered with the presenting HPI.  Medications  fentaNYL (SUBLIMAZE) injection 50 mcg (50 mcg Intravenous Given 11/16/14 1237)  potassium chloride SA (K-DUR,KLOR-CON) CR tablet 40 mEq (40 mEq Oral Given 11/16/14 1404)    Filed Vitals:   11/16/14 1200 11/16/14 1230 11/16/14 1300 11/16/14 1330  BP: 162/63 159/65 165/68 150/66  Pulse: 82 76 75 72  Temp:      TempSrc:      Resp: 19 20 15 14   SpO2: 100% 96% 98% 100%    Final diagnoses:  ESRD (end stage renal disease) on dialysis  Acute chest pain  Hypokalemia    Admission/ observation were discussed with the admitting physician, patient and/or family and they are comfortable with the plan.    Alan Morrison, MD 11/16/14 1409

## 2014-11-16 NOTE — ED Notes (Addendum)
Pt c/o sudden onset left sided chest pain with no radiation during dialysis today. [facility] brought pt to ED. Pt given 3 Nitro and 324mg  Aspirin with no relief. Pt denies nausea, vomiting, diaphoresis, weakness. Pt states "it feels like something is sitting on my chest". Pt reports slight SOB.

## 2014-11-16 NOTE — ED Notes (Signed)
CRITICAL VALUE ALERT  Critical value received: potassium 2.7  Date of notification:  11/16/14  Time of notification:  1320  Critical value read back: yes  Nurse who received alert:  Jeni Salles RN  MD notified (1st page):  Rosalyn Gess  Time of first page:  1321  MD notified (2nd page):  Time of second page:  Responding MD:  Rosalyn Gess  Time MD responded:  [phone]

## 2014-11-16 NOTE — H&P (Signed)
History and Physical  Liron Albrecht [id number] DOB: 02/23/1940 DOA: 11/16/2014  Referring physician: Dr. Reather Converse PCP: PROVIDER NOT IN SYSTEM   Chief Complaint: chest pain  HPI:  54yom PMH CAD, diabetes mellitus, ESRD on HD) episode of chest pain today while on hemodialysis. This was associated with some shortness of breath. He did not finish his dialysis session. In the emergency department initial evaluation was unremarkable including negative troponin, nonacute EKG and chest x-ray without acute disease. Observation was recommended.  He reports that during hemodialysis he had pressure sensation at the center of his chest which lasted approximately one hour without specific aggravating or alleviating factors. He was given nitroglycerin and aspirin. Eventually the pain seemed improved although he did have recurrence of this in the emergency department. No shortness of breath now but did have some shortness of breath earlier. No nausea or vomiting. He reports some bilateral shoulder pain but no pain in neck or jaw. He has not had any recent cardiac testing and has not seen a cardiologist in a long time. He does have a history of CABG which was complicated by postoperative infection requiring extensive debridement and reconstruction surgery.  In the emergency department afebrile, vital signs stable, no hypoxia. Pertinent labs: Potassium 2.7, remainder BMP consistent with end-stage renal disease. Troponin negative. Hemoglobin stable 8.6. Modest leukocytosis 12.3. EKG: Independently reviewed. Sinus rhythm, no acute changes 1135. Sinus rhythm, no acute changes 1619. Imaging: Chest x-ray independently reviewed. No acute disease.  Review of Systems:  Negative for fever, new visual changes, sore throat, rash, bleeding, n/v/abdominal pain.  Past Medical History  Diagnosis Date  . Diabetes mellitus   . Hypertension   . Gout   . Coronary artery disease   . Stroke   . Asthma   . Renal insufficiency    . Hypercholesteremia   . Arthritis   . DDD (degenerative disc disease), lumbar   . DDD (degenerative disc disease), cervical   . Gangrene of toe Feb. 2015    Right  great    . Gastric ulcer     EGD 2014  . Thrombosis of renal dialysis arteriovenous graft   . Clotted renal dialysis arteriovenous graft     Past Surgical History  Procedure Laterality Date  . Coronary artery bypass graft    . Below knee leg amputation Bilateral   . Esophagogastroduodenoscopy Left 05/24/2012    [id number] dilated baggy but otherwise a normal/ Small hiatal hernia. Gastric ulcer -S/P biopsy  . Amputation Right 04/10/2013    Procedure: Right Below Knee Amputation;  Surgeon: Newt Minion, MD;  Location: [facility];  Service: Orthopedics;  Laterality: Right;  Right Below Knee Amputation  . Appendectomy    . Peripheral vascular catheterization N/A 08/09/2014    Procedure: A/V Shuntogram/Fistulagram;  Surgeon: Katha Cabal, MD;  Location: [facility];  Service: Cardiovascular;  Laterality: N/A;  . Peripheral vascular catheterization Left 08/09/2014    Procedure: A/V Shunt Intervention;  Surgeon: Katha Cabal, MD;  Location: [facility];  Service: Cardiovascular;  Laterality: Left;  . Cholecystectomy      Social History:  reports that he quit smoking about 10 years ago. He has never used smokeless tobacco. He reports that he does not drink alcohol or use illicit drugs. lives with their spouse Self-care  Allergies  Allergen Reactions  . Codeine Itching and Nausea And Vomiting  . Yellow Jacket Venom [Bee Venom] Swelling    Family History  Problem Relation Age of Onset  .  Colon cancer Neg Hx      Prior to Admission medications   Medication Sig Start Date End Date Taking? Authorizing Provider  acetaminophen (TYLENOL) 500 MG tablet Take 500 mg by mouth 2 (two) times daily.   Yes Historical Provider, MD  aspirin EC 81 MG tablet Take 81 mg by mouth at bedtime.    Yes Historical  Provider, MD  B Complex-C-Folic Acid (NEPHRO-VITE PO) Take 1 tablet by mouth every evening.    Yes Historical Provider, MD  cephALEXin (KEFLEX) 500 MG capsule Take 1 capsule (500 mg total) by mouth 4 (four) times daily. 11/05/14  Yes Francine Graven, DO  cinacalcet (SENSIPAR) 30 MG tablet Take 30 mg by mouth daily.   Yes Historical Provider, MD  colchicine 0.6 MG tablet Take 0.6 mg by mouth daily.   Yes Historical Provider, MD  furosemide (LASIX) 40 MG tablet Take 20 mg by mouth every evening.   Yes Historical Provider, MD  lactose free nutrition (BOOST) LIQD Take 237 mLs by mouth daily.   Yes Historical Provider, MD  LANTUS SOLOSTAR 100 UNIT/ML Solostar Pen Inject 6 Units into the skin at bedtime. 12/21/13  Yes Historical Provider, MD  omeprazole (PRILOSEC) 20 MG capsule Take 1 capsule (20 mg total) by mouth 2 (two) times daily before a meal. 06/17/13  Yes Radene Gunning, NP  oxybutynin (DITROPAN) 5 MG tablet Take 1 tablet (5 mg total) by mouth 3 (three) times daily. 11/08/14  Yes Daleen Bo, MD  sevelamer carbonate (RENVELA) 800 MG tablet Take 800-1,600 mg by mouth 3 (three) times daily with meals. Patient takes 2 tablets with meals and 1 tablet with snack   Yes Historical Provider, MD  vitamin C (ASCORBIC ACID) 500 MG tablet Take 500 mg by mouth daily.   Yes Historical Provider, MD  Amino Acid Infusion (PROSOL) 20 % SOLN every Monday, Wednesday, and Friday.  10/27/14   Historical Provider, MD  docusate sodium (COLACE) 100 MG capsule Take 100 mg by mouth daily as needed for mild constipation.    Historical Provider, MD  fluticasone (FLONASE) 50 MCG/ACT nasal spray Place 2 sprays into both nostrils daily as needed for allergies.     Historical Provider, MD  nitroGLYCERIN (NITROSTAT) 0.4 MG SL tablet Place 0.4 mg under the tongue every 5 (five) minutes as needed for chest pain.     Historical Provider, MD   Physical Exam: Filed Vitals:   11/16/14 1300 11/16/14 1330 11/16/14 1430 11/16/14 1505  BP:  165/68 150/66 171/71 187/64  Pulse: 75 72 71 70  Temp:    98.1 F (36.7 C)  TempSrc:    Oral  Resp: 15 14 16 18   Height:    5\' 9"  (1.753 m)  Weight:    80.5 kg (177 lb 7.5 oz)  SpO2: 98% 100% 99% 100%   General:  Appears calm and comfortable Eyes: PERRL, normal lids, irises & conjunctiva ENT: grossly normal hearing, lips & tongue Neck: no LAD, masses or thyromegaly Cardiovascular: RRR, no m/r/g. No LE edema. Respiratory: CTA bilaterally, no w/r/r. Normal respiratory effort. Abdomen: soft, ntnd Skin: no rash or induration noted Musculoskeletal: grossly normal tone BUE/BLE. Bilateral BKA Psychiatric: grossly normal mood and affect, speech fluent and appropriate Neurologic: grossly non-focal.  Wt Readings from Last 3 Encounters:  11/16/14 80.5 kg (177 lb 7.5 oz)  11/08/14 79.379 kg (175 lb)  11/05/14 79 kg (174 lb 2.6 oz)    Labs on Admission:  Basic Metabolic Panel:  Recent Labs Lab 11/16/14  1221  NA 131*  K 2.7*  CL 93*  CO2 26  GLUCOSE 124*  BUN 36*  CREATININE 3.87*  CALCIUM 7.2*    CBC:  Recent Labs Lab 11/16/14 1221  WBC 12.3*  HGB 8.6*  HCT 25.1*  MCV 93.7  PLT 319    Cardiac Enzymes:  Recent Labs Lab 11/16/14 1221  TROPONINI 0.03      Radiological Exams on Admission: Dg Chest 1 View  11/16/2014   CLINICAL DATA:  Sudden onset of left chest pain during dialysis today.  EXAM: CHEST 1 VIEW  COMPARISON:  01/14/2014  FINDINGS: Prior CABG. Mild cardiomegaly. Low lung volumes. Linear atelectasis or scarring in the left base. No focal opacities on the right. No effusions or acute bony abnormality.  IMPRESSION: Stable left basilar scarring or chronic atelectasis. Low lung volumes. Cardiomegaly.   Electronically Signed   By: Rolm Baptise M.D.   On: 11/16/2014 13:18      Principal Problem:   Acute chest pain Active Problems:   CAD (coronary artery disease)   DM type 2 causing vascular disease   S/P BKA (below knee amputation) unilateral, left    ESRD on dialysis   Assessment/Plan 1. Chest pain with some typical features.  initial troponin negative, EKG nonacute. Chest x-ray no acute disease. May be GERD. Takes PPI twice a day at home.  2. CAD. Has had aspirin today.  3. DM with PVD, nephropathy, appears stable, random blood sugar 124, anion gap 12. 4. ESRD on HD, did not complete hemodialysis session today but no evidence of gross volume overload. 5.  Recent UTI.  6. hypokalemia   Obs to tele  Trend troponin, Crispin ask cardiology opinion 9/29 as it has been sometime since he has had evaluation.    Jong consult nephrology if not discharged 9/29. No immediate need for further hemodialysis.  Replete potassium.  Code Status: full code  DVT prophylaxis: heparin Family Communication:  discussed in detail with wife and son at bedside Disposition Plan/Anticipated LOS: obs, 24 hours  Time spent: 73 minutes  Murray Hodgkins, MD  [facility] Pager [phone] 11/16/2014, 4:47 PM

## 2014-11-17 ENCOUNTER — Observation Stay (HOSPITAL_BASED_OUTPATIENT_CLINIC_OR_DEPARTMENT_OTHER): Payer: Medicare Other

## 2014-11-17 ENCOUNTER — Encounter (HOSPITAL_COMMUNITY): Payer: Self-pay | Admitting: Adult Health

## 2014-11-17 DIAGNOSIS — E1151 Type 2 diabetes mellitus with diabetic peripheral angiopathy without gangrene: Secondary | ICD-10-CM | POA: Diagnosis not present

## 2014-11-17 DIAGNOSIS — I2510 Atherosclerotic heart disease of native coronary artery without angina pectoris: Secondary | ICD-10-CM

## 2014-11-17 DIAGNOSIS — R079 Chest pain, unspecified: Secondary | ICD-10-CM | POA: Diagnosis not present

## 2014-11-17 DIAGNOSIS — N186 End stage renal disease: Secondary | ICD-10-CM | POA: Diagnosis not present

## 2014-11-17 DIAGNOSIS — Z992 Dependence on renal dialysis: Secondary | ICD-10-CM | POA: Diagnosis not present

## 2014-11-17 LAB — TROPONIN I
TROPONIN I: 0.03 ng/mL (ref <0.031)
Troponin I: 0.03 ng/mL (ref <0.031)

## 2014-11-17 MED ORDER — ATORVASTATIN CALCIUM 40 MG PO TABS
80.0000 mg | ORAL_TABLET | Freq: Every day | ORAL | Status: DC
  Administered 2014-11-17: 80 mg via ORAL
  Filled 2014-11-17: qty 2

## 2014-11-17 MED ORDER — LISINOPRIL 10 MG PO TABS
10.0000 mg | ORAL_TABLET | Freq: Every day | ORAL | Status: DC
  Administered 2014-11-17: 10 mg via ORAL
  Filled 2014-11-17: qty 1

## 2014-11-17 NOTE — Care Management Note (Signed)
Case Management Note  Patient Details  Name: Alan [name] MRN: [id number] Date of Birth: [birth year]  Expected Discharge Date:       11/17/2014           Expected Discharge Plan:  Home/Self Care  In-House Referral:  NA  Discharge planning Services  CM Consult  Post Acute Care Choice:  NA Choice offered to:  NA  DME Arranged:    DME Agency:     HH Arranged:    [place] Agency:     Status of Service:  In process, [place] continue to follow  Medicare Important Message Given:    Date Medicare IM Given:    Medicare IM give by:    Date Additional Medicare IM Given:    Additional Medicare Important Message give by:     If discussed at [place] of Stay Meetings, dates discussed:    Additional Comments: Pt is from home with wife. Pt admitted for r/o CP. Pt began to experience CP during HD. Pt getting echo at time of CM visit. Alexandru visit again to further discuss DC plan, anticipate plan for DC home with wife and self care. Possible stress test on 9/30. Tyray cont to follow.  Sherald Barge, RN 11/17/2014, 2:17 PM

## 2014-11-17 NOTE — Consult Note (Signed)
CARDIOLOGY CONSULT NOTE   Patient IDAutumn [name] MRN: [id number] DOB/AGE: 1940-10-14 74 y.o.  Admit Date: 11/16/2014 Referring Physician: PTH-Goodrich MD Primary Physician: PROVIDER NOT [place] Cardiologist: Carlyle Dolly MD Primary Cardiologist:  Reason for Consultation: Chest Pain   Clinical Summary Alan [name] is a 74 y.o.male with known history of CAD-stents to unknown arteries in 2000 ([facility]), CABG ([facility] 1996), hypertension, daibetes, PAD s/p bilateral amputations, CVA,  ESRD on dialysis MWF, who has had frequent admissions for recurrent chest pain and found to be non-cardiac at those times. He is normally followed by Dr. Alroy Dust in [place], but has not been seen for over 5 years. He does not know how many bypasses he has had or where his stents are located.   He presents to the ER with complaints of chest pain. He was undergoing dialysis when the chest pressure began, described as severe heaviness on his chest. Some trouble breathing. Was given NTG by dialysis nurses, but did not relieve.  On arrival BP 144/62, HR 81, O2 sat 99%, afebrile. He was found to be anemic, Hgb 8.6/Hct 25.1, (most recent prior to that on 11/08/2014-Hg 9.0/Hct 25.7); He was significantly hypokalemic, 2.7; creatinine 3.87. CXR was negative for CHF or pneumonia, cardiomegaly was noted EKG NSR. No acute ST T wave abnormalities. He was treated with potassium and fentanyl. Troponin has been negative X 3.   He is currently pain free, but does have some soreness in his chest on the left. No issues with breathing or diaphoresis.    Allergies  Allergen Reactions  . Codeine Itching and Nausea And Vomiting  . Yellow Jacket Venom [Bee Venom] Swelling    Medications Scheduled Medications: . aspirin EC  81 mg Oral QHS  . cephALEXin  500 mg Oral QID  . cinacalcet  30 mg Oral Q breakfast  . heparin  5,000 Units Subcutaneous 3 times per day  . insulin glargine  6 Units Subcutaneous QHS  .  pantoprazole  40 mg Oral BID  . sevelamer carbonate  1,600 mg Oral TID WC    Infusions:    PRN Medications: acetaminophen, nitroGLYCERIN, ondansetron (ZOFRAN) IV, sevelamer carbonate **AND** sevelamer carbonate   Past Medical History  Diagnosis Date  . Diabetes mellitus   . Hypertension   . Gout   . Coronary artery disease   . Stroke   . Asthma   . Renal insufficiency   . Hypercholesteremia   . Arthritis   . DDD (degenerative disc disease), lumbar   . DDD (degenerative disc disease), cervical   . Gangrene of toe Feb. 2015    Right  great    . Gastric ulcer     EGD 2014  . Thrombosis of renal dialysis arteriovenous graft   . Clotted renal dialysis arteriovenous graft     Past Surgical History  Procedure Laterality Date  . Coronary artery bypass graft    . Below knee leg amputation Bilateral   . Esophagogastroduodenoscopy Left 05/24/2012    [id number] dilated baggy but otherwise a normal/ Small hiatal hernia. Gastric ulcer -S/P biopsy  . Amputation Right 04/10/2013    Procedure: Right Below Knee Amputation;  Surgeon: Newt Minion, MD;  Location: [place];  Service: Orthopedics;  Laterality: Right;  Right Below Knee Amputation  . Appendectomy    . Peripheral vascular catheterization N/A 08/09/2014    Procedure: A/V Shuntogram/Fistulagram;  Surgeon: Katha Cabal, MD;  Location: [place] [facility];  Service: Cardiovascular;  Laterality: N/A;  .  Peripheral vascular catheterization Left 08/09/2014    Procedure: A/V Shunt Intervention;  Surgeon: Katha Cabal, MD;  Location: [place] [facility];  Service: Cardiovascular;  Laterality: Left;  . Cholecystectomy      Family History  Problem Relation Age of Onset  . Colon cancer Neg Hx   . Cancer Mother     Social History [name] reports that he quit smoking about 10 years ago. He has never used smokeless tobacco. Mr. Howery reports that he does not drink alcohol.  Review of Systems Complete review of systems  are found to be negative unless outlined in H&P above.  Physical Examination Blood pressure 186/65, pulse 70, temperature 98.5 F (36.9 C), temperature source Oral, resp. rate 20, height 5\' 9"  (1.753 m), weight 175 lb 4.3 oz (79.5 kg), SpO2 100 %.  Intake/Output Summary (Last 24 hours) at 11/17/14 0823 Last data filed at 11/17/14 0530  Gross per 24 hour  Intake      0 ml  Output    400 ml  Net   -400 ml    Telemetry: NSR  GEN:No acute distress.  HEENT: Conjunctiva and lids normal, oropharynx clear with moist mucosa. Neck: Supple, no elevated JVP or carotid bruits, no thyromegaly. Lungs: Clear to auscultation, nonlabored breathing at rest. Cardiac: Regular rate and rhythm, no S3 or significant systolic murmur, no pericardial rub. Abdomen: Soft, nontender, no hepatomegaly, bowel sounds present, no guarding or rebound. Extremities: No pitting edema, bilateral above the knee amputee  Dialysis fistula in the left arm.  Skin: Warm and dry. Musculoskeletal: No kyphosis. Neuropsychiatric: Alert and oriented x3, affect grossly appropriate.  Prior Cardiac Testing/Procedures Followed by Dr. Alroy Dust in [place] are requested.  CABG, Stents per [facility] (not found in [place]).  Lab Results  Basic Metabolic Panel:  Recent Labs Lab 11/16/14 1221  NA 131*  K 2.7*  CL 93*  CO2 26  GLUCOSE 124*  BUN 36*  CREATININE 3.87*  CALCIUM 7.2*   CBC:  Recent Labs Lab 11/16/14 1221  WBC 12.3*  HGB 8.6*  HCT 25.1*  MCV 93.7  PLT 319    Cardiac Enzymes:  Recent Labs Lab 11/16/14 1221 11/16/14 1939 11/17/14 0015 11/17/14 0517  TROPONINI 0.03 0.03 0.03 0.03    Radiology: Dg Chest 1 View  11/16/2014   CLINICAL DATA:  Sudden onset of left chest pain during dialysis today.  EXAM: CHEST 1 VIEW  COMPARISON:  01/14/2014  FINDINGS: Prior CABG. Mild cardiomegaly. Low lung volumes. Linear atelectasis or scarring in the left base. No focal opacities on the right. No  effusions or acute bony abnormality.  IMPRESSION: Stable left basilar scarring or chronic atelectasis. Low lung volumes. Cardiomegaly.   Electronically Signed   By: Rolm Baptise M.D.   On: 11/16/2014 13:18    ECG: NSR rate of 68 bpm.    Impression and Recommendations  1.Chest Pain: Likely related to hypokalemia. Potassium was 2.7. With replacement he has had resolution of pain. Troponin has been negative X 3, EKG is normal ruling out ACS as etiology of chest pain.   2. CAD: Hx of CABG in 1996 at [facility] with stents to unknown arteries. He has not been seen by cardiology for over 5 years. I Arya request records from Dr. Alroy Dust in [place]. In the interim, echocardiogram Dnaiel be completed. Consider NM study if EF is diminished verses more invasive testing. Currently he is pain free. He is not on statin, ACE, or BB currently on review of home  medications. Cayleb check fasting lipids and LFT's.   3. Hypertension: Currently elevated this am. Consider adding hydralazine for better control, 25 mg TID.   4. ESRD: Followed by Dr. Hinda Lenis for dialysis.   5. PVD: Bilateral AKA, has been seen by Dr. Sherren Mocha Early for this.   6. Hx of CVA: No residual defects on assessment.   7. Diabetes: Check Hgb A1C.   8. Hx of Peptic Ulcer Disease: Has been seen by GI in the past on PPI.    Signed: Phill Myron. Lawrence NP [place]  11/17/2014, 8:23 AM Co-Sign MD  Patient seen and discussed with NP Purcell Nails, I agree with her documentation. 74 yo male history of DM2, ESRD, CAD with prior CABG (1994 at [facility] 2 vessel: LIMA-LAD, SVG-OM1)  and stents (exact history unclear) at [facility] admitted with chest pain. Episode occurred yesterday during dialysis. Describes 8/10 pressure pain left chest with some associated SOB. Notes somewhat worst with position. Lasted approx 30 minutes and then resolved. Has not had any prior episodes of chest pain.   K 2.7, Cr 3.87, Hgb 8.6, Plt 319, trop neg x3 CXR no acute process EKG SR, no ischemic  changes  Patient with significant history of CAD presents with chest pain. Mixed features for possible cardiac chest pain by history. Thus far EKG and enzymes are benign. We Eryc f/u his echo today. Pending results consider possible stress test, he ate this AM, pending echo would consider lexiscan tomorrow AM. He should be on high dose statin given his history of CAD as well as ACE-I for the proven cardiovascular outcome benefits.    Zandra Abts MD

## 2014-11-17 NOTE — Progress Notes (Signed)
PROGRESS NOTE  Alan [name] [id number] DOB: 02-Mar-1940 DOA: 11/16/2014 PCP: PROVIDER NOT IN SYSTEM  Summary: 89yom PMH CAD, diabetes mellitus, ESRD on HD) episode of chest pain while on hemodialysis. This was associated with some shortness of breath. He did not finish his dialysis session. In the emergency department initial evaluation was unremarkable including negative troponin, nonacute EKG and chest x-ray without acute disease. Observation was recommended.  Assessment/Plan: 1. Chest pain with some typical features, troponins negative, EKG non acute, has ruled out for ACS. Possibly GERD. Takes PPI twice a day at home.  2. CAD. 3. DM with PVD, nephropathy, appears stable 4. ESRD on HD TTS, did not complete hemodialysis session yesterday but no evidence of gross volume overload.  5. Recent UTI.    Overall doing well, pain relieved. Follow up echo and further cardiology recommendations.  If plan made for stress test,  [name] consult nephrology for hemodialysis prior to discharge.    Code Status: full code DVT prophylaxis: heparin Family Communication: Discussed with patient and wife who understands and has no concerns at this time. Disposition Plan:Anticipate discharge within 1-2 days  Murray Hodgkins, MD  [facility]  Pager [phone] If 7PM-7AM, please contact night-coverage at [email or web address], password [facility] 11/17/2014, 7:42 AM    Consultants:  Cardiology  Procedures:    Antibiotics:    HPI/Subjective: Feeling rough. Slept through the night. No chest pain now. Does have chronic shoulder pain. No nausea, vomiting or SOB.  Objective: Filed Vitals:   11/16/14 1505 11/16/14 2209 11/17/14 0225 11/17/14 0638  BP: 187/64 157/66 188/56 186/65  Pulse: 70 70 80 70  Temp: 98.1 F (36.7 C) 98.6 F (37 C) 98.5 F (36.9 C) 98.5 F (36.9 C)  TempSrc: Oral Oral Oral Oral  Resp: 18  20 20   Height: 5\' 9"  (1.753 m)     Weight: 80.5 kg (177 lb 7.5 oz)   79.5 kg (175 lb  4.3 oz)  SpO2: 100% 94% 100% 100%    Intake/Output Summary (Last 24 hours) at 11/17/14 0742 Last data filed at 11/17/14 0530  Gross per 24 hour  Intake      0 ml  Output    400 ml  Net   -400 ml     Filed Weights   11/16/14 1505 11/17/14 0638  Weight: 80.5 kg (177 lb 7.5 oz) 79.5 kg (175 lb 4.3 oz)    Exam:    VSS, not hypoxic, afebrile General:  Appears calm and comfortable Cardiovascular: RRR, no m/r/g. No LE edema. Respiratory: CTA bilaterally, no w/r/r. Normal respiratory effort. Psychiatric: grossly normal mood and affect, speech fluent and appropriate  New data reviewed:  Troponin WNL   Pertinent data since admission:  CXR IMPRESSION:Stable left basilar scarring or chronic atelectasis. Low lung volumes. Cardiomegaly.  Pending data:    Scheduled Meds: . aspirin EC  81 mg Oral QHS  . cephALEXin  500 mg Oral QID  . cinacalcet  30 mg Oral Q breakfast  . heparin  5,000 Units Subcutaneous 3 times per day  . insulin glargine  6 Units Subcutaneous QHS  . pantoprazole  40 mg Oral BID  . sevelamer carbonate  1,600 mg Oral TID WC   Continuous Infusions:   Principal Problem:   Acute chest pain Active Problems:   CAD (coronary artery disease)   DM type 2 causing vascular disease   S/P BKA (below knee amputation) unilateral, left   ESRD on dialysis   Time spent 20 minutes  By signing my  name below, I, Rosalie Doctor attest that this documentation has been prepared under the direction and in the presence of Murray Hodgkins, MD Electronically signed: Rosalie Doctor, Scribe.  11/17/2014 12:43 PM  I personally performed the services described in this documentation. All medical record entries made by the scribe were at my direction. I have reviewed the chart and agree that the record reflects my personal performance and is accurate and complete. Murray Hodgkins, MD

## 2014-11-17 NOTE — Consult Note (Signed)
Reason for Consult: End-stage renal disease Referring Physician: Dr. Sarajane Jews  Alan [name] is an 74 y.o. male.  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 Patient came to the dialysis unit yesterday and he was getting dialysis patient complains of left-sided chest pain, sharp and non-radiating. Patient was given 2 nitroglycerin sublingually without much improvement. Since patient described the pain more severe than before he was sent to the emergency room where he is admitted for further evaluation because of his multiple risk factors. Presently patient states that he has on and off pain but seems to be better. He denies any difficulty breathing, no orthopnea or paroxysmal nocturnal dyspnea. Pain occasionally aggravated by movement and by some cough.  Past Medical History  Diagnosis Date  . Diabetes mellitus   . Hypertension   . Gout   . Coronary artery disease 2000    Stents [facility]  . Stroke   . Asthma   . Hypercholesteremia   . Arthritis   . DDD (degenerative disc disease), lumbar   . DDD (degenerative disc disease), cervical   . Gangrene of toe Feb. 2015    Right  great    . Gastric ulcer     EGD 2014  . Thrombosis of renal dialysis arteriovenous graft   . Clotted renal dialysis arteriovenous graft   . CVA (cerebral infarction)   . ESRD (end stage renal disease) on dialysis     Past Surgical History  Procedure Laterality Date  . Coronary artery bypass graft  1996    [facility]  . Below knee leg amputation Bilateral   . Esophagogastroduodenoscopy Left 05/24/2012    VIF:[id number] dilated baggy but otherwise a normal/ Small hiatal hernia. Gastric ulcer -S/P biopsy  . Amputation Right 04/10/2013    Procedure: Right Below Knee Amputation;  Surgeon: Newt Minion, MD;  Location: [place];  Service: Orthopedics;  Laterality: Right;  Right Below Knee  Amputation  . Appendectomy    . Peripheral vascular catheterization N/A 08/09/2014    Procedure: A/V Shuntogram/Fistulagram;  Surgeon: Katha Cabal, MD;  Location: [place] [facility];  Service: Cardiovascular;  Laterality: N/A;  . Peripheral vascular catheterization Left 08/09/2014    Procedure: A/V Shunt Intervention;  Surgeon: Katha Cabal, MD;  Location: [place] [facility];  Service: Cardiovascular;  Laterality: Left;  . Cholecystectomy      Family History  Problem Relation Age of Onset  . Colon cancer Neg Hx   . Cancer Mother     Social History:  reports that he quit smoking about 10 years ago. He has never used smokeless tobacco. He reports that he does not drink alcohol or use illicit drugs.  Allergies:  Allergies  Allergen Reactions  . Codeine Itching and Nausea And Vomiting  . Yellow Jacket Venom [Bee Venom] Swelling    Medications: I have reviewed the patient's current medications.  Results for orders placed or performed during the hospital encounter of 11/16/14 (from the past 48 hour(s))  CBC     Status: Abnormal   Collection Time: 11/16/14 12:21 PM  Result Value Ref Range   WBC 12.3 (H) 4.0 - 10.5 K/uL   RBC 2.68 (L) 4.22 - 5.81 MIL/uL   Hemoglobin 8.6 (L) 13.0 - 17.0 g/dL   HCT 25.1 (L) 39.0 - 52.0 %   MCV 93.7 78.0 - 100.0 fL   MCH 32.1 26.0 -  34.0 pg   MCHC 34.3 30.0 - 36.0 g/dL   RDW 13.6 11.5 - 15.5 %   Platelets 319 150 - 400 K/uL  Basic metabolic panel     Status: Abnormal   Collection Time: 11/16/14 12:21 PM  Result Value Ref Range   Sodium 131 (L) 135 - 145 mmol/L   Potassium 2.7 (LL) 3.5 - 5.1 mmol/L    Comment: CRITICAL RESULT CALLED TO, READ BACK BY AND VERIFIED WITH: YOUNG,J AT 1320 BY HUFFINES,S ON 11/16/14    Chloride 93 (L) 101 - 111 mmol/L   CO2 26 22 - 32 mmol/L   Glucose, Bld 124 (H) 65 - 99 mg/dL   BUN 36 (H) 6 - 20 mg/dL   Creatinine, Ser 3.87 (H) 0.61 - 1.24 mg/dL   Calcium 7.2 (L) 8.9 - 10.3 mg/dL   GFR calc non Af  Amer 14 (L) >60 mL/min   GFR calc Af Amer 16 (L) >60 mL/min    Comment: (NOTE) The eGFR has been calculated using the CKD EPI equation. This calculation has not been validated in all clinical situations. eGFR's persistently <60 mL/min signify possible Chronic Kidney Disease.    Anion gap 12 5 - 15  Troponin I     Status: None   Collection Time: 11/16/14 12:21 PM  Result Value Ref Range   Troponin I 0.03 <0.031 ng/mL    Comment:        NO INDICATION OF MYOCARDIAL INJURY.   Troponin I (q 6hr x 3)     Status: None   Collection Time: 11/16/14  7:39 PM  Result Value Ref Range   Troponin I 0.03 <0.031 ng/mL    Comment:        NO INDICATION OF MYOCARDIAL INJURY.   Troponin I (q 6hr x 3)     Status: None   Collection Time: 11/17/14 12:15 AM  Result Value Ref Range   Troponin I 0.03 <0.031 ng/mL    Comment:        NO INDICATION OF MYOCARDIAL INJURY.   Troponin I (q 6hr x 3)     Status: None   Collection Time: 11/17/14  5:17 AM  Result Value Ref Range   Troponin I 0.03 <0.031 ng/mL    Comment:        NO INDICATION OF MYOCARDIAL INJURY.     Dg Chest 1 View  11/16/2014   CLINICAL DATA:  Sudden onset of left chest pain during dialysis today.  EXAM: CHEST 1 VIEW  COMPARISON:  01/14/2014  FINDINGS: Prior CABG. Mild cardiomegaly. Low lung volumes. Linear atelectasis or scarring in the left base. No focal opacities on the right. No effusions or acute bony abnormality.  IMPRESSION: Stable left basilar scarring or chronic atelectasis. Low lung volumes. Cardiomegaly.   Electronically Signed   By: Rolm Baptise M.D.   On: 11/16/2014 13:18    Review of Systems  Constitutional: Negative for chills and malaise/fatigue.  Respiratory: Positive for cough and sputum production. Negative for shortness of breath.   Cardiovascular: Positive for chest pain. Negative for palpitations and orthopnea.  Gastrointestinal: Negative for nausea and vomiting.   Blood pressure 156/60, pulse 65, temperature  97.8 F (36.6 C), temperature source Oral, resp. rate 20, height _0  (1.753 m), weight 175 lb 4.3 oz (79.5 kg), SpO2 95 %. Physical Exam  Constitutional: No distress.  Eyes: No scleral icterus.  Neck: JVD present.  Cardiovascular: Normal rate and regular rhythm.  Exam reveals no gallop.  Respiratory: No respiratory distress. He has no wheezes. He has no rales.  GI: There is no tenderness. There is no rebound.  Musculoskeletal: He exhibits no edema.    Assessment/Plan: Problem #1 chest pain: Presently patient is feeling better. He has multiple risk factors including coronary artery disease, diabetes, end-stage, hypertension. Problem #2 hypertension: His blood pressures reasonably controlled Problem #3 end-stage renal disease: He is status post short dialysis yesterday. His potassium is low but patient does not have any uremic sinus symptoms. Problem #4 coronary artery disease Problem #5 anemia: His hemoglobin is below our target goal Problem #6 metabolic bone disease: His calcium slightly low but stable. Patient with low albumen. Problem #7 status post left BKA Plan: Patient doesn't require dialysis today 2] [place] make arrangements for patient to get dialysis tomorrow 3] [place] use 3K/2.5 calcium bath for 3-1/2 hours 4] Geovannie use Epogen 14,000 units IV after each dialysis. 5] [place] check basic metabolic panel and CBC in the morning.  BEFEKADU,BELAYENH S 11/17/2014, 2:01 PM

## 2014-11-18 ENCOUNTER — Observation Stay (HOSPITAL_COMMUNITY): Payer: Medicare Other

## 2014-11-18 ENCOUNTER — Encounter (HOSPITAL_COMMUNITY): Payer: Self-pay

## 2014-11-18 ENCOUNTER — Observation Stay (HOSPITAL_BASED_OUTPATIENT_CLINIC_OR_DEPARTMENT_OTHER): Payer: Medicare Other

## 2014-11-18 DIAGNOSIS — R079 Chest pain, unspecified: Secondary | ICD-10-CM

## 2014-11-18 DIAGNOSIS — I2510 Atherosclerotic heart disease of native coronary artery without angina pectoris: Secondary | ICD-10-CM | POA: Diagnosis not present

## 2014-11-18 DIAGNOSIS — N186 End stage renal disease: Secondary | ICD-10-CM | POA: Diagnosis not present

## 2014-11-18 DIAGNOSIS — E1151 Type 2 diabetes mellitus with diabetic peripheral angiopathy without gangrene: Secondary | ICD-10-CM | POA: Diagnosis not present

## 2014-11-18 LAB — LIPID PANEL
Cholesterol: 245 mg/dL — ABNORMAL HIGH (ref 0–200)
HDL: 30 mg/dL — ABNORMAL LOW (ref >40)
LDL CALC: 180 mg/dL — AB (ref 0–99)
Total CHOL/HDL Ratio: 8.2 RATIO
Triglycerides: 175 mg/dL — ABNORMAL HIGH (ref <150)
VLDL: 35 mg/dL (ref 0–40)

## 2014-11-18 LAB — NM MYOCAR MULTI W/SPECT W/WALL MOTION / EF
CHL CUP NUCLEAR SSS: 9
CSEPPHR: 81 {beats}/min
LV dias vol: 90 mL
LVSYSVOL: 36 mL
RATE: 0.11
Rest HR: 67 {beats}/min
SDS: 4
SRS: 5
TID: 1.24

## 2014-11-18 LAB — CBC
HEMATOCRIT: 24 % — AB (ref 39.0–52.0)
HEMOGLOBIN: 8.2 g/dL — AB (ref 13.0–17.0)
MCH: 32 pg (ref 26.0–34.0)
MCHC: 34.2 g/dL (ref 30.0–36.0)
MCV: 93.8 fL (ref 78.0–100.0)
Platelets: 318 10*3/uL (ref 150–400)
RBC: 2.56 MIL/uL — ABNORMAL LOW (ref 4.22–5.81)
RDW: 13.7 % (ref 11.5–15.5)
WBC: 9.9 10*3/uL (ref 4.0–10.5)

## 2014-11-18 LAB — BASIC METABOLIC PANEL
Anion gap: 10 (ref 5–15)
BUN: 55 mg/dL — AB (ref 6–20)
CHLORIDE: 99 mmol/L — AB (ref 101–111)
CO2: 26 mmol/L (ref 22–32)
Calcium: 8 mg/dL — ABNORMAL LOW (ref 8.9–10.3)
Creatinine, Ser: 5.85 mg/dL — ABNORMAL HIGH (ref 0.61–1.24)
GFR calc Af Amer: 10 mL/min — ABNORMAL LOW (ref >60)
GFR calc non Af Amer: 8 mL/min — ABNORMAL LOW (ref >60)
Glucose, Bld: 122 mg/dL — ABNORMAL HIGH (ref 65–99)
POTASSIUM: 3.9 mmol/L (ref 3.5–5.1)
SODIUM: 135 mmol/L (ref 135–145)

## 2014-11-18 LAB — HEMOGLOBIN A1C
Hgb A1c MFr Bld: 5.2 % (ref 4.8–5.6)
MEAN PLASMA GLUCOSE: 103 mg/dL

## 2014-11-18 MED ORDER — SODIUM CHLORIDE 0.9 % IJ SOLN
INTRAMUSCULAR | Status: AC
  Filled 2014-11-18: qty 36

## 2014-11-18 MED ORDER — LIDOCAINE HCL (PF) 1 % IJ SOLN: 5.0000 mL | INTRAMUSCULAR | Status: DC | PRN

## 2014-11-18 MED ORDER — ATORVASTATIN CALCIUM 80 MG PO TABS: 80.0000 mg | ORAL_TABLET | Freq: Every day | ORAL | Status: DC

## 2014-11-18 MED ORDER — EPOETIN ALFA 4000 UNIT/ML IJ SOLN
INTRAMUSCULAR | Status: AC
  Administered 2014-11-18: 4000 [IU] via INTRAVENOUS
  Filled 2014-11-18: qty 1

## 2014-11-18 MED ORDER — AMLODIPINE BESYLATE 5 MG PO TABS: 10.0000 mg | ORAL_TABLET | Freq: Every day | ORAL | Status: DC

## 2014-11-18 MED ORDER — ALTEPLASE 2 MG IJ SOLR
2.0000 mg | Freq: Once | INTRAMUSCULAR | Status: DC | PRN
  Filled 2014-11-18: qty 2

## 2014-11-18 MED ORDER — LISINOPRIL 5 MG PO TABS: 5.0000 mg | ORAL_TABLET | Freq: Every day | ORAL | Status: DC

## 2014-11-18 MED ORDER — SODIUM CHLORIDE 0.9 % IV SOLN: 100.0000 mL | INTRAVENOUS | Status: DC | PRN

## 2014-11-18 MED ORDER — AMLODIPINE BESYLATE 5 MG PO TABS: 5.0000 mg | ORAL_TABLET | Freq: Every day | ORAL | Status: DC

## 2014-11-18 MED ORDER — LIDOCAINE-PRILOCAINE 2.5-2.5 % EX CREA
1.0000 | TOPICAL_CREAM | CUTANEOUS | Status: DC | PRN
Start: 2014-11-18 — End: 2014-11-18

## 2014-11-18 MED ORDER — HEPARIN SODIUM (PORCINE) 1000 UNIT/ML DIALYSIS
1000.0000 [IU] | INTRAMUSCULAR | Status: DC | PRN
  Filled 2014-11-18: qty 1

## 2014-11-18 MED ORDER — SODIUM CHLORIDE 0.9 % IJ SOLN
INTRAMUSCULAR | Status: AC
  Administered 2014-11-18: 10 mL via INTRAVENOUS
  Filled 2014-11-18: qty 3

## 2014-11-18 MED ORDER — EPOETIN ALFA 4000 UNIT/ML IJ SOLN
4000.0000 [IU] | INTRAMUSCULAR | Status: DC
  Administered 2014-11-18: 4000 [IU] via INTRAVENOUS
  Filled 2014-11-18 (×2): qty 1

## 2014-11-18 MED ORDER — TECHNETIUM TC 99M SESTAMIBI - CARDIOLITE
10.0000 | Freq: Once | INTRAVENOUS | Status: AC | PRN
  Administered 2014-11-18: 07:00:00 9.2 via INTRAVENOUS

## 2014-11-18 MED ORDER — TECHNETIUM TC 99M SESTAMIBI GENERIC - CARDIOLITE
30.0000 | Freq: Once | INTRAVENOUS | Status: AC | PRN
  Administered 2014-11-18: 32 via INTRAVENOUS

## 2014-11-18 MED ORDER — PENTAFLUOROPROP-TETRAFLUOROETH EX AERO
1.0000 | INHALATION_SPRAY | CUTANEOUS | Status: DC | PRN
Start: 2014-11-18 — End: 2014-11-18

## 2014-11-18 MED ORDER — FUROSEMIDE 40 MG PO TABS
20.0000 mg | ORAL_TABLET | Freq: Every evening | ORAL | Status: DC
Start: 2014-11-18 — End: 2014-12-22

## 2014-11-18 MED ORDER — REGADENOSON 0.4 MG/5ML IV SOLN
INTRAVENOUS | Status: AC
  Administered 2014-11-18: 0.4 mg via INTRAVENOUS
  Filled 2014-11-18: qty 5

## 2014-11-18 MED ORDER — NITROGLYCERIN 0.4 MG SL SUBL: 0.4000 mg | SUBLINGUAL_TABLET | SUBLINGUAL | Status: AC | PRN

## 2014-11-18 NOTE — Care Management Note (Signed)
Case Management Note  Patient Details  Name: Carlan Polacek MRN: [id number] Date of Birth: 1941-01-18  Expected Discharge Date:   11/18/2014               Expected Discharge Plan:  Home/Self Care  In-House Referral:  NA  Discharge planning Services  CM Consult  Post Acute Care Choice:  NA Choice offered to:  NA  DME Arranged:    DME Agency:     HH Arranged:    [place] Agency:     Status of Service:  Completed, signed off  Medicare Important Message Given:    Date Medicare IM Given:    Medicare IM give by:    Date Additional Medicare IM Given:    Additional Medicare Important Message give by:     If discussed at [place] of Stay Meetings, dates discussed:    Additional Comments: Pt is from home, lives with wife, is wheelchair bound and received HD MWF at [facility] in [place]. Pt has no HH services prior to admission and no need anticipated at DC. No CM needs noted. Possible DC today after stress test.    Sherald Barge, RN 11/18/2014, 9:23 AM

## 2014-11-18 NOTE — Progress Notes (Signed)
Patient was out of room for test.

## 2014-11-18 NOTE — Procedures (Signed)
   HEMODIALYSIS TREATMENT NOTE:  3.5 hour heparin-free dialysis completed via left forearm AVF (16g/antegrade). Goal NOT met: BP unable to tolerate removal of 2.5 liters as ordered. Ultrafiltration rate was decreased three times during tx due to declining BP. Net UF 2.1 liters. All blood was reinfused and hemostasis was achieved within 10 minutes. Report called to Sharen Hones, RN.  Rockwell [place], RN, CDN

## 2014-11-18 NOTE — Progress Notes (Signed)
Pt given discharge instructions, prescriptions, and care notes. Pt verbalized understanding AEB no further questions or concerns at this time. IV was discontinued, no redness, pain, or swelling noted at this time. Telemetry discontinued and Centralized Telemetry was notified. Pt left the floor via wheelchair with staff in stable condition. Precious Gilding, RN.

## 2014-11-18 NOTE — Progress Notes (Signed)
PROGRESS NOTE  Kerri Lianne Cure [id number] DOB: 05-16-1940 DOA: 11/16/2014 PCP: PROVIDER NOT IN SYSTEM  Summary: 15yom PMH CAD, diabetes mellitus, ESRD on HD) episode of chest pain while on hemodialysis. This was associated with some shortness of breath. He did not finish his dialysis session. In the emergency department initial evaluation was unremarkable including negative troponin, nonacute EKG and chest x-ray without acute disease. Observation was recommended.  Assessment/Plan: 1. Chest pain with some typical features, troponins negative, EKG non acute, ruled out for ACS. Nuclear stress test low risk, cleared by cardiology for discharge. Possibly GERD. Takes PPI twice a day at home.  2. CAD.  3. DM with PVD, nephropathy, stable 4. ESRD on HD. 5. Recent UTI.  6. HTN. Stable.   Overall feeling well, no chest pain. Workup negative.  Per cardiology low risk study, recommendations for medical therapy for coronary artery disease including addition of Norvasc, lisinopril and Lipitor. Follow-up in 2 weeks with cardiology as an outpatient has been arranged.  Discussed with wife and son at bedside.  Murray Hodgkins, MD  [facility]  Pager [phone] If 7PM-7AM, please contact night-coverage at [email or web address], password [facility] 11/18/2014, 3:22 PM    Consultants:  Cardiology  Nephrology   Procedures:  Echocardiogram Study Conclusions  - Left ventricle: The cavity size was normal. Wall thickness was increased in a pattern of moderate LVH. Systolic function was normal. The estimated ejection fraction was in the range of 60% to 65%. Diastolic function is abnormal, indeterminate grade. Wall motion was normal; there were no regional wall motion abnormalities. - Aortic valve: Moderately calcified annulus. Trileaflet; moderately thickened leaflets. Valve area (VTI): 3.03 cm^2. Valve area (Vmax): 2.91 cm^2. - Mitral valve: Mildly calcified annulus. Normal thickness  leaflets . - Left atrium: The atrium was moderately dilated. - Right ventricle: The cavity size was mildly dilated. - Right atrium: The atrium was mildly dilated.  Antibiotics:    HPI/Subjective: Feels better. No chest pain or shortness of breath. No nausea or vomiting. Eating well.  Objective: Filed Vitals:   11/18/14 1300 11/18/14 1330 11/18/14 1350 11/18/14 1405  BP: 119/60 95/54 121/55 132/57  Pulse: 78 75 79 78  Temp:    98.2 F (36.8 C)  TempSrc:    Oral  Resp:    16  Height:      Weight:    79 kg (174 lb 2.6 oz)  SpO2:    100%    Intake/Output Summary (Last 24 hours) at 11/18/14 1522 Last data filed at 11/18/14 1350  Gross per 24 hour  Intake    240 ml  Output   3100 ml  Net  -2860 ml     Filed Weights   11/18/14 0638 11/18/14 1015 11/18/14 1405  Weight: 80.9 kg (178 lb 5.6 oz) 81.1 kg (178 lb 12.7 oz) 79 kg (174 lb 2.6 oz)    Exam:    General:  Appears comfortable, calm. Cardiovascular: Regular rate and rhythm, no murmur, rub or gallop. No lower extremity edema. Telemetry: Sinus rhythm, no arrhythmias  Respiratory: Clear to auscultation bilaterally, no wheezes, rales or rhonchi. Normal respiratory effort. Psychiatric: grossly normal mood and affect, speech fluent and appropriate  New data reviewed:  BMP consistent with ESRD, potassium 3.9   Hgb stable 8.2  Pertinent data since admission:  CXR IMPRESSION:Stable left basilar scarring or chronic atelectasis. Low lung volumes. Cardiomegaly.  Pending data:    Scheduled Meds: . [START ON 11/19/2014] amLODipine  5 mg Oral Daily  . aspirin EC  81 mg Oral QHS  . atorvastatin  80 mg Oral q1800  . cephALEXin  500 mg Oral QID  . cinacalcet  30 mg Oral Q breakfast  . epoetin (EPOGEN/PROCRIT) injection  4,000 Units Intravenous Q M,W,F-HD  . heparin  5,000 Units Subcutaneous 3 times per day  . insulin glargine  6 Units Subcutaneous QHS  . [START ON 11/19/2014] lisinopril  5 mg Oral Daily  .  pantoprazole  40 mg Oral BID  . sevelamer carbonate  1,600 mg Oral TID WC  . sodium chloride       Continuous Infusions:   Principal Problem:   Acute chest pain Active Problems:   CAD (coronary artery disease)   DM type 2 causing vascular disease   S/P BKA (below knee amputation) unilateral, left   ESRD on dialysis

## 2014-11-18 NOTE — Progress Notes (Signed)
Alan [name]  MRN: [id number]  DOB/AGE: [birth year] 74 y.o.  Primary Care Physician:PROVIDER NOT IN SYSTEM  Admit date: 11/16/2014  Chief Complaint:  Chief Complaint  Patient presents with  . Chest Pain    S-Pt presented on  11/16/2014 with  Chief Complaint  Patient presents with  . Chest Pain  .    Pt today feels better. Pt seen on HD. Pt tolerating tx well.  Meds . amLODipine  5 mg Oral Daily  . aspirin EC  81 mg Oral QHS  . atorvastatin  80 mg Oral q1800  . cephALEXin  500 mg Oral QID  . cinacalcet  30 mg Oral Q breakfast  . heparin  5,000 Units Subcutaneous 3 times per day  . insulin glargine  6 Units Subcutaneous QHS  . lisinopril  10 mg Oral Daily  . pantoprazole  40 mg Oral BID  . sevelamer carbonate  1,600 mg Oral TID WC  . sodium chloride         Physical Exam: Vital signs in last 24 hours: Temp:  [97.4 F (36.3 C)-97.8 F (36.6 C)] 97.4 F (36.3 C) (09/30 [id number]) Pulse Rate:  [62-68] 68 (09/30 0638) Resp:  [20] 20 (09/30 [id number]) BP: (156-184)/(52-61) 184/61 mmHg (09/30 0638) SpO2:  [95 %-100 %] 100 % (09/30 [id number]) Weight:  [178 lb 5.6 oz (80.9 kg)] 178 lb 5.6 oz (80.9 kg) (09/30 [id number]) Weight change: 14.1 oz (0.4 kg) Last BM Date: 11/17/14  Intake/Output from previous day: 09/29 0701 - 09/30 0700 In: 600 [P.O.:600] Out: 1150 [Urine:1150]     Physical Exam: General- pt is awake,alert, oriented to time place and person Resp- No acute REsp distress, CTA B/L NO Rhonchi CVS- S1S2 regular in rate and rhythm GIT- BS+, soft, NT, ND EXT- NO LE Edema, Cyanosis, B/l BKA Access-left AVF +   Lab Results: CBC  Recent Labs  11/16/14 1221  WBC 12.3*  HGB 8.6*  HCT 25.1*  PLT 319    BMET  Recent Labs  11/16/14 1221 11/18/14 0529  NA 131* 135  K 2.7* 3.9  CL 93* 99*  CO2 26 26  GLUCOSE 124* 122*  BUN 36* 55*  CREATININE 3.87* 5.85*  CALCIUM 7.2* 8.0*    MICRO Recent Results (from the past 240 hour(s))  Culture, blood (routine x 2)      Status: None   Collection Time: 11/08/14  3:35 PM  Result Value Ref Range Status   Specimen Description BLOOD RIGHT ANTECUBITAL DRAWN BY RN  Final   Special Requests BOTTLES DRAWN AEROBIC AND ANAEROBIC [facility] EACH  Final   Culture NO GROWTH 5 DAYS  Final   Report Status 11/13/2014 FINAL  Final  Culture, blood (routine x 2)     Status: None   Collection Time: 11/08/14  3:50 PM  Result Value Ref Range Status   Specimen Description BLOOD RIGHT HAND  Final   Special Requests BOTTLES DRAWN AEROBIC AND ANAEROBIC 6CC  Final   Culture NO GROWTH 5 DAYS  Final   Report Status 11/13/2014 FINAL  Final      Lab Results  Component Value Date   PTH 602.6* 05/20/2012   CALCIUM 8.0* 11/18/2014   PHOS 3.4 04/09/2013               Impression: 1)Renal  ESRD on HD                Pt on MOn/WEd/Friday schedule  Pt being dialyzed today   2)HTN  Medication- On RAS blockers  3)Anemia HGb not at goal (9--11) On epo during HD  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   5)CVS-admitted with chest pain Primary  MD following  6)Electrolytes Normokalemic NOrmonatremic   7)Acid base Co2 at goal     Plan:  [name]     [name] 11/18/2014, 9:48 AM

## 2014-11-18 NOTE — Discharge Summary (Signed)
Physician Discharge Summary  Alan [name] [id number] DOB: 1940-06-11 DOA: 11/16/2014  PCP: Verley Pariseau NOT IN SYSTEM  Admit date: 11/16/2014 Discharge date: 11/18/2014  Recommendations for Outpatient Follow-up:  1. Chest pain. Per cardiology low risk study, recommendations for medical therapy for coronary artery disease including addition of Norvasc, lisinopril and Lipitor. Follow-up in 2 weeks with cardiology as an outpatient has been arranged.   Follow-up Information    Follow up with Jory Sims, NP On 12/02/2014.   Specialties:  Nurse Practitioner, Radiology, Cardiology   Why:  at 1:00 pm   Contact information:   [place]  [id number] [phone]      Discharge Diagnoses:  1. Chest pain 2. Coronary artery disease 3. Diabetes mellitus with peripheral vascular disease 4. End-stage renal disease on hemodialysis 5. Hypertension  Discharge Condition: improved Disposition: home  Diet recommendation: heart healthy, diabetic diet  Filed Weights   11/18/14 [id number] 11/18/14 1015 11/18/14 1405  Weight: 80.9 kg (178 lb 5.6 oz) 81.1 kg (178 lb 12.7 oz) 79 kg (174 lb 2.6 oz)    History of present illness:  76yom PMH CAD, diabetes mellitus, ESRD on HD) episode of chest pain while on hemodialysis. This was associated with some shortness of breath. He did not finish his dialysis session. In the emergency department initial evaluation was unremarkable including negative troponin, nonacute EKG and chest x-ray without acute disease. Observation was recommended.  Hospital Course:  Patient was observed, ruled out for ACS with serial troponin. EKG was nonacute. Seen by cardiology with recommendations for nuclear stress testing which was low risk. Per cardiology medical management is recommended, Lipitor, lisinopril and Norvasc were added and discharge home recommended. Hospitalization was uncomplicated. Individual issues as below.  1. Chest pain with some typical features,  troponins negative, EKG non acute, ruled out for ACS. Nuclear stress test low risk, cleared by cardiology for discharge. Possibly GERD. Takes PPI twice a day at home.  2. CAD. 3. DM with PVD, nephropathy, stable 4. ESRD on HD. 5. Recent UTI.  6. HTN. Stable.  Consultants:  Cardiology  Nephrology  Procedures:  Echocardiogram Study Conclusions  - Left ventricle: The cavity size was normal. Wall thickness was increased in a pattern of moderate LVH. Systolic function was normal. The estimated ejection fraction was in the range of 60% to 65%. Diastolic function is abnormal, indeterminate grade. Wall motion was normal; there were no regional wall motion abnormalities. - Aortic valve: Moderately calcified annulus. Trileaflet; moderately thickened leaflets. Valve area (VTI): 3.03 cm^2. Valve area (Vmax): 2.91 cm^2. - Mitral valve: Mildly calcified annulus. Normal thickness leaflets . - Left atrium: The atrium was moderately dilated. - Right ventricle: The cavity size was mildly dilated. - Right atrium: The atrium was mildly dilated.  Discharge Instructions  Discharge Instructions    Activity as tolerated - No restrictions    Complete by:  As directed      Diet - low sodium heart healthy    Complete by:  As directed      Diet Carb Modified    Complete by:  As directed      Discharge instructions    Complete by:  As directed   Call your physician or seek immediate medical attention for pain, shortness of breath or worsening of condition.          Discharge Medication List as of 11/18/2014  3:56 PM    START taking these medications   Details  amLODipine (NORVASC) 5 MG tablet Take 1  tablet (5 mg total) by mouth daily., Starting 11/19/2014, Until Discontinued, Normal    atorvastatin (LIPITOR) 80 MG tablet Take 1 tablet (80 mg total) by mouth daily at 6 PM., Starting 11/18/2014, Until Discontinued, Normal    lisinopril (PRINIVIL,ZESTRIL) 5 MG tablet Take 1  tablet (5 mg total) by mouth daily., Starting 11/19/2014, Until Discontinued, Normal      CONTINUE these medications which have CHANGED   Details  furosemide (LASIX) 40 MG tablet Take 0.5 tablets (20 mg total) by mouth every evening., Starting 11/18/2014, Until Discontinued, Normal    nitroGLYCERIN (NITROSTAT) 0.4 MG SL tablet Place 1 tablet (0.4 mg total) under the tongue every 5 (five) minutes as needed for chest pain., Starting 11/18/2014, Until Discontinued, Normal      CONTINUE these medications which have NOT CHANGED   Details  acetaminophen (TYLENOL) 500 MG tablet Take 500 mg by mouth 2 (two) times daily., Until Discontinued, Historical Med    aspirin EC 81 MG tablet Take 81 mg by mouth at bedtime. , Until Discontinued, Historical Med    B Complex-C-Folic Acid (NEPHRO-VITE PO) Take 1 tablet by mouth every evening. , Until Discontinued, Historical Med    cephALEXin (KEFLEX) 500 MG capsule Take 1 capsule (500 mg total) by mouth 4 (four) times daily., Starting 11/05/2014, Until Discontinued, Print    cinacalcet (SENSIPAR) 30 MG tablet Take 30 mg by mouth daily., Until Discontinued, Historical Med    colchicine 0.6 MG tablet Take 0.6 mg by mouth daily., Until Discontinued, Historical Med    lactose free nutrition (BOOST) LIQD Take 237 mLs by mouth daily., Until Discontinued, Historical Med    LANTUS SOLOSTAR 100 UNIT/ML Solostar Pen Inject 6 Units into the skin at bedtime., Starting 12/21/2013, Until Discontinued, Historical Med    omeprazole (PRILOSEC) 20 MG capsule Take 1 capsule (20 mg total) by mouth 2 (two) times daily before a meal., Starting 06/17/2013, Until Discontinued, OTC    oxybutynin (DITROPAN) 5 MG tablet Take 1 tablet (5 mg total) by mouth 3 (three) times daily., Starting 11/08/2014, Until Discontinued, Print    sevelamer carbonate (RENVELA) 800 MG tablet Take 800-1,600 mg by mouth 3 (three) times daily with meals. Patient takes 2 tablets with meals and 1 tablet with  snack, Until Discontinued, Historical Med    vitamin C (ASCORBIC ACID) 500 MG tablet Take 500 mg by mouth daily., Until Discontinued, Historical Med    Amino Acid Infusion (PROSOL) 20 % SOLN every Monday, Wednesday, and Friday. , Starting 10/27/2014, Until Discontinued, Historical Med    docusate sodium (COLACE) 100 MG capsule Take 100 mg by mouth daily as needed for mild constipation., Until Discontinued, Historical Med    fluticasone (FLONASE) 50 MCG/ACT nasal spray Place 2 sprays into both nostrils daily as needed for allergies. , Until Discontinued, Historical Med       Allergies  Allergen Reactions  . Codeine Itching and Nausea And Vomiting  . [place] Venom [Bee Venom] Swelling    The results of significant diagnostics from this hospitalization (including imaging, microbiology, ancillary and laboratory) are listed below for reference.    Significant Diagnostic Studies: Dg Chest 1 View  11/16/2014   CLINICAL DATA:  Sudden onset of left chest pain during dialysis today.  EXAM: CHEST 1 VIEW  COMPARISON:  01/14/2014  FINDINGS: Prior CABG. Mild cardiomegaly. Low lung volumes. Linear atelectasis or scarring in the left base. No focal opacities on the right. No effusions or acute bony abnormality.  IMPRESSION: Stable left basilar scarring  or chronic atelectasis. Low lung volumes. Cardiomegaly.   Electronically Signed   By: Rolm Baptise M.D.   On: 11/16/2014 13:18   Nm Myocar Multi W/spect W/wall Motion / Ef  11/18/2014    There was no ST segment deviation noted during stress.  Findings consistent with prior inferolateral myocardial infarction with  mild peri-infarct ischemia.  This is a low risk study. Small area of myocardium at jeopardy.  Nuclear stress EF: 61%.     Labs: Basic Metabolic Panel:  Recent Labs Lab 11/16/14 1221 11/18/14 0529  NA 131* 135  K 2.7* 3.9  CL 93* 99*  CO2 26 26  GLUCOSE 124* 122*  BUN 36* 55*  CREATININE 3.87* 5.85*  CALCIUM 7.2* 8.0*    CBC:  Recent Labs Lab 11/16/14 1221 11/18/14 1020  WBC 12.3* 9.9  HGB 8.6* 8.2*  HCT 25.1* 24.0*  MCV 93.7 93.8  PLT 319 318   Cardiac Enzymes:  Recent Labs Lab 11/16/14 1221 11/16/14 1939 11/17/14 0015 11/17/14 0517  TROPONINI 0.03 0.03 0.03 0.03      Principal Problem:   Acute chest pain Active Problems:   CAD (coronary artery disease)   DM type 2 causing vascular disease   S/P BKA (below knee amputation) unilateral, left   ESRD on dialysis   Time coordinating discharge: 35 minutes  Signed:  Murray Hodgkins, MD [facility] 11/18/2014, 5:20 PM

## 2014-11-18 NOTE — Progress Notes (Signed)
Consulting cardiologist: Carlyle Dolly, MD Primary Cardiologist: New  Cardiology Specific Problem List: 1. CAD 2. Hypertension 3. Chest Pain  Subjective:    Some discomfort overnight, but not severe. Patient assessed in stress lab, prior to [employer].   Objective:   Temp:  [97.4 F (36.3 C)-97.8 F (36.6 C)] 97.4 F (36.3 C) (09/30 [id number]) Pulse Rate:  [62-68] 68 (09/30 0638) Resp:  [20] 20 (09/30 0638) BP: (156-184)/(52-61) 184/61 mmHg (09/30 0638) SpO2:  [95 %-100 %] 100 % (09/30 [id number]) Weight:  [178 lb 5.6 oz (80.9 kg)] 178 lb 5.6 oz (80.9 kg) (09/30 0638) Last BM Date: 11/17/14  Filed Weights   11/16/14 1505 11/17/14 [id number] 11/18/14 [id number]  Weight: 177 lb 7.5 oz (80.5 kg) 175 lb 4.3 oz (79.5 kg) 178 lb 5.6 oz (80.9 kg)    Intake/Output Summary (Last 24 hours) at 11/18/14 0832 Last data filed at 11/18/14 0600  Gross per 24 hour  Intake    360 ml  Output   1000 ml  Net   -640 ml    Telemetry: NSR  Exam:  General: No acute distress.  HEENT: Conjunctiva and lids normal, oropharynx clear.  Lungs: Clear to auscultation, nonlabored.  Cardiac: No elevated JVP or bruits. RRR, no gallop or rub.   Abdomen: Normoactive bowel sounds, nontender, nondistended.  Extremities: No pitting edema,bilateral AKA.  Neuropsychiatric: Alert and oriented x3, affect appropriate.   Lab Results:  Basic Metabolic Panel:  Recent Labs Lab 11/16/14 1221 11/18/14 0529  NA 131* 135  K 2.7* 3.9  CL 93* 99*  CO2 26 26  GLUCOSE 124* 122*  BUN 36* 55*  CREATININE 3.87* 5.85*  CALCIUM 7.2* 8.0*     CBC:  Recent Labs Lab 11/16/14 1221  WBC 12.3*  HGB 8.6*  HCT 25.1*  MCV 93.7  PLT 319    Cardiac Enzymes:  Recent Labs Lab 11/16/14 1939 11/17/14 0015 11/17/14 0517  TROPONINI 0.03 0.03 0.03   Radiology: Dg Chest 1 View  11/16/2014   CLINICAL DATA:  Sudden onset of left chest pain during dialysis today.  EXAM: CHEST 1 VIEW  COMPARISON:  01/14/2014   FINDINGS: Prior CABG. Mild cardiomegaly. Low lung volumes. Linear atelectasis or scarring in the left base. No focal opacities on the right. No effusions or acute bony abnormality.  IMPRESSION: Stable left basilar scarring or chronic atelectasis. Low lung volumes. Cardiomegaly.   Electronically Signed   By: Rolm Baptise M.D.   On: 11/16/2014 13:18     Medications:   Scheduled Medications: . aspirin EC  81 mg Oral QHS  . atorvastatin  80 mg Oral q1800  . cephALEXin  500 mg Oral QID  . cinacalcet  30 mg Oral Q breakfast  . heparin  5,000 Units Subcutaneous 3 times per day  . insulin glargine  6 Units Subcutaneous QHS  . lisinopril  10 mg Oral Daily  . pantoprazole  40 mg Oral BID  . sevelamer carbonate  1,600 mg Oral TID WC  . sodium chloride        Infusions:    PRN Medications: acetaminophen, nitroGLYCERIN, ondansetron (ZOFRAN) IV, sevelamer carbonate **AND** sevelamer carbonate, technetium sestamibi generic   Assessment and Plan:   1. CAD with Hx of CABG: Patient continues to have intermittent chest pain but non-specific. Troponin is negative. He is undergoing Lexiscan Cardiolite stress this am. On review of stress EKG, there were no acute ST-T wave changes during test. He did experience mild chest discomfort. Await results.  2. Hypertension: Not well controlled despite lisinopril. Daishaun add amlodipine 5 mg to current medication regimen.   3. ESRD: Followed by Dr. Hinda Lenis.    Phill Myron. [place] NP [place]  11/18/2014, 8:32 AM  Attending Note Patient seen and discussed with NP Purcell Nails, I agree with her documentation above. Carlton Adam shows old inferolateral infarct with only mild ischemia, overall low risk study. Echo with normal LVEF 60-65%, no WMAs. Continue medical therapy for CAD, started norvasc 5mg  daily as antianginal. He Solomon need f/u with NP Lawerence in 2 weeks after discharge. We [place] sign off inpatient care.   Zandra Abts MD

## 2014-11-19 LAB — HEPATITIS B SURFACE ANTIGEN: HEP B S AG: NEGATIVE

## 2014-12-02 ENCOUNTER — Encounter: Payer: Medicare Other | Admitting: Adult Health

## 2014-12-06 ENCOUNTER — Encounter: Payer: Medicare Other | Admitting: Adult Health

## 2014-12-20 ENCOUNTER — Ambulatory Visit: Payer: Medicare Other | Admitting: Urology

## 2014-12-22 ENCOUNTER — Encounter: Payer: Self-pay | Admitting: Adult Health

## 2014-12-22 ENCOUNTER — Ambulatory Visit (INDEPENDENT_AMBULATORY_CARE_PROVIDER_SITE_OTHER): Payer: Medicare Other | Admitting: Adult Health

## 2014-12-22 VITALS — BP 132/54 | HR 65

## 2014-12-22 DIAGNOSIS — I10 Essential (primary) hypertension: Secondary | ICD-10-CM | POA: Diagnosis not present

## 2014-12-22 DIAGNOSIS — E7800 Pure hypercholesterolemia, unspecified: Secondary | ICD-10-CM

## 2014-12-22 MED ORDER — FUROSEMIDE 40 MG PO TABS: 20.0000 mg | ORAL_TABLET | Freq: Every evening | ORAL | Status: DC

## 2014-12-22 MED ORDER — AMLODIPINE BESYLATE 5 MG PO TABS: 5.0000 mg | ORAL_TABLET | Freq: Every day | ORAL | Status: DC

## 2014-12-22 MED ORDER — LISINOPRIL 5 MG PO TABS: 5.0000 mg | ORAL_TABLET | Freq: Every day | ORAL | Status: DC

## 2014-12-22 NOTE — Progress Notes (Signed)
Cardiology Office Note   Date:  12/22/2014   ID:  Alan [name], DOB Nov 15, 1940, MRN [id number]  PCP:  PROVIDER NOT IN SYSTEM  Cardiologist:  Branch/ Jory Sims, NP   No chief complaint on file.     History of Present Illness: Alan [name] is a 74 y.o. male who presents for CAD-stents to unknown arteries in 2000 ([facility]), CABG ([facility] 1996), hypertension, daibetes, PAD s/p bilateral amputations, CVA, ESRD on dialysis MWF, who has had frequent admissions for recurrent chest pain and found to be non-cardiac at those times.He was hospitalized at [facility] 2016 with recurrent chest pain. Had low risk MPI. He was given Rx for amlodipine.          He comes today with complaints of myalgias since beginning statin. He denies chest pain He is wheel chair bound.   Past Medical History  Diagnosis Date  . Diabetes mellitus   . Hypertension   . Gout   . Coronary artery disease 2000    Stents [facility]  . Stroke   . Asthma   . Hypercholesteremia   . Arthritis   . DDD (degenerative disc disease), lumbar   . DDD (degenerative disc disease), cervical   . Gangrene of toe Feb. 2015    Right  great    . Gastric ulcer     EGD 2014  . Thrombosis of renal dialysis arteriovenous graft   . Clotted renal dialysis arteriovenous graft   . CVA (cerebral infarction)   . ESRD (end stage renal disease) on dialysis     Past Surgical History  Procedure Laterality Date  . Coronary artery bypass graft  1996    [facility]  . Below knee leg amputation Bilateral   . Esophagogastroduodenoscopy Left 05/24/2012    [id number] dilated baggy but otherwise a normal/ Small hiatal hernia. Gastric ulcer -S/P biopsy  . Amputation Right 04/10/2013    Procedure: Right Below Knee Amputation;  Surgeon: Newt Minion, MD;  Location: [facility];  Service: Orthopedics;  Laterality: Right;  Right Below Knee Amputation  . Appendectomy    . Peripheral vascular catheterization N/A 08/09/2014    Procedure: A/V Shuntogram/Fistulagram;  Surgeon:  Katha Cabal, MD;  Location: [facility];  Service: Cardiovascular;  Laterality: N/A;  . Peripheral vascular catheterization Left 08/09/2014    Procedure: A/V Shunt Intervention;  Surgeon: Katha Cabal, MD;  Location: [facility];  Service: Cardiovascular;  Laterality: Left;  . Cholecystectomy       Current Outpatient Prescriptions  Medication Sig Dispense Refill  . acetaminophen (TYLENOL) 500 MG tablet Take 500 mg by mouth 2 (two) times daily.    . Amino Acid Infusion (PROSOL) 20 % SOLN every Monday, Wednesday, and Friday.     Marland Kitchen amLODipine (NORVASC) 5 MG tablet Take 1 tablet (5 mg total) by mouth daily. 30 tablet 0  . aspirin EC 81 MG tablet Take 81 mg by mouth at bedtime.     Marland Kitchen atorvastatin (LIPITOR) 80 MG tablet Take 1 tablet (80 mg total) by mouth daily at 6 PM. 30 tablet 0  . B Complex-C-Folic Acid (NEPHRO-VITE PO) Take 1 tablet by mouth every evening.     . cephALEXin (KEFLEX) 500 MG capsule Take 1 capsule (500 mg total) by mouth 4 (four) times daily. 40 capsule 0  . cinacalcet (SENSIPAR) 30 MG tablet Take 30 mg by mouth daily.    . colchicine 0.6 MG tablet Take 0.6 mg by mouth daily.    Marland Kitchen  docusate sodium (COLACE) 100 MG capsule Take 100 mg by mouth daily as needed for mild constipation.    . fluticasone (FLONASE) 50 MCG/ACT nasal spray Place 2 sprays into both nostrils daily as needed for allergies.     . furosemide (LASIX) 40 MG tablet Take 0.5 tablets (20 mg total) by mouth every evening. 30 tablet 0  . lactose free nutrition (BOOST) LIQD Take 237 mLs by mouth daily.    Marland Kitchen LANTUS SOLOSTAR 100 UNIT/ML Solostar Pen Inject 6 Units into the skin at bedtime.    Marland Kitchen lisinopril (PRINIVIL,ZESTRIL) 5 MG tablet Take 1 tablet (5 mg total) by mouth daily. 30 tablet 0  . nitroGLYCERIN (NITROSTAT) 0.4 MG SL tablet Place 1 tablet (0.4 mg total) under the tongue every 5 (five) minutes as needed for chest pain. 30 tablet 0  . omeprazole (PRILOSEC) 20 MG capsule Take 1 capsule  (20 mg total) by mouth 2 (two) times daily before a meal.    . oxybutynin (DITROPAN) 5 MG tablet Take 1 tablet (5 mg total) by mouth 3 (three) times daily. 20 tablet 0  . sevelamer carbonate (RENVELA) 800 MG tablet Take 800-1,600 mg by mouth 3 (three) times daily with meals. Patient takes 2 tablets with meals and 1 tablet with snack    . vitamin C (ASCORBIC ACID) 500 MG tablet Take 500 mg by mouth daily.     No current facility-administered medications for this visit.    Allergies:   Codeine and Yellow jacket venom    Social History:  The patient  reports that he quit smoking about 10 years ago. He has never used smokeless tobacco. He reports that he does not drink alcohol or use illicit drugs.   Family History:  The patient's family history includes Cancer in his mother. There is no history of Colon cancer.    ROS: All other systems are reviewed and negative. Unless otherwise mentioned in H&P    PHYSICAL EXAM: VS:  There were no vitals taken for this visit. , BMI There is no weight on file to calculate BMI. GEN: Well nourished, well developed, in no acute distress HEENT: normal Neck: no JVD, carotid bruits, or masses Cardiac: RRR; no murmurs, rubs, or gallops,no edema  Respiratory:  clear to auscultation bilaterally, normal work of breathing GI: soft, nontender, nondistended, + BS MS: no deformity or atrophyBilateral AKA. Skin: warm and dry, no rash Neuro:  Strength and sensation are intact Psych: euthymic mood, full affect    Recent Labs: 11/08/2014: ALT 9* 11/18/2014: BUN 55*; Creatinine, Ser 5.85*; Hemoglobin 8.2*; Platelets 318; Potassium 3.9; Sodium 135    Lipid Panel    Component Value Date/Time   CHOL 245* 11/18/2014 0533   TRIG 175* 11/18/2014 0533   HDL 30* 11/18/2014 0533   CHOLHDL 8.2 11/18/2014 0533   VLDL 35 11/18/2014 0533   LDLCALC 180* 11/18/2014 0533      Wt Readings from Last 3 Encounters:  11/18/14 174 lb 2.6 oz (79 kg)  11/08/14 175 lb (79.379  kg)  11/05/14 174 lb 2.6 oz (79 kg)        ASSESSMENT AND PLAN:  1.  Hypertension; Continue lisinopril and amlodipine. Refills are given  2. Hypercholesterolemia; Alan [name] hold statin for a few days to see if this helps with myalgia.  3. ESRD: Alan [name] defer to Dr. Hinda Lenis lasix adjustments. One refill given.   Current medicines are reviewed at length with the patient today.      Disposition:   FU with  6 months.   Signed, Jory Sims, NP  12/22/2014 1:03 PM    [facility] [address] [place], [place] [address] Phone: [phone]; Fax: [phone]

## 2014-12-22 NOTE — Patient Instructions (Addendum)
Medication Instructions:  Your physician has recommended you make the following change in your medication:  STOP ATORVASTIN UNTIL Monday AND CALL OUT OFFICE AND UPDATE [place] ON HOW YOU ARE FEELING.   Labwork: NONE  Testing/Procedures: NONE  Follow-Up: 6 MONTHS  Any Other Special Instructions Bo Be Listed Below (If Applicable).   Thank you for choosing [facility]!    If you need a refill on your cardiac medications before your next appointment, please call your pharmacy.

## 2014-12-22 NOTE — Progress Notes (Deleted)
Name: Alan [name]    DOB: Mar 25, 1940  Age: 74 y.o.  MR#: [id number]       PCP:  PROVIDER NOT IN SYSTEM      Insurance: Payor: [employer] MEDICARE / Plan: [employer] MEDICARE / Product Type: *No Product type* /   CC:   No chief complaint on file.   VS Filed Vitals:   12/22/14 1309  BP: 132/54  Pulse: 65  SpO2: 98%    Weights Current Weight  11/18/14 174 lb 2.6 oz (79 kg)  11/08/14 175 lb (79.379 kg)  11/05/14 174 lb 2.6 oz (79 kg)    Blood Pressure  BP Readings from Last 3 Encounters:  12/22/14 132/54  11/18/14 132/57  11/08/14 152/67     Admit date:  (Not on file) Last encounter with RMR:  Visit date not found   Allergy Codeine and Yellow jacket venom  Current Outpatient Prescriptions  Medication Sig Dispense Refill  . acetaminophen (TYLENOL) 500 MG tablet Take 500 mg by mouth 2 (two) times daily.    . Amino Acid Infusion (PROSOL) 20 % SOLN every Monday, Wednesday, and Friday.     Marland Kitchen amLODipine (NORVASC) 5 MG tablet Take 1 tablet (5 mg total) by mouth daily. 30 tablet 0  . aspirin EC 81 MG tablet Take 81 mg by mouth at bedtime.     Marland Kitchen atorvastatin (LIPITOR) 80 MG tablet Take 1 tablet (80 mg total) by mouth daily at 6 PM. 30 tablet 0  . B Complex-C-Folic Acid (NEPHRO-VITE PO) Take 1 tablet by mouth every evening.     . cinacalcet (SENSIPAR) 30 MG tablet Take 30 mg by mouth daily.    . fluticasone (FLONASE) 50 MCG/ACT nasal spray Place 2 sprays into both nostrils daily as needed for allergies.     . furosemide (LASIX) 40 MG tablet Take 0.5 tablets (20 mg total) by mouth every evening. 30 tablet 0  . lactose free nutrition (BOOST) LIQD Take 237 mLs by mouth daily.    Marland Kitchen LANTUS SOLOSTAR 100 UNIT/ML Solostar Pen Inject 6 Units into the skin at bedtime.    Marland Kitchen lisinopril (PRINIVIL,ZESTRIL) 5 MG tablet Take 1 tablet (5 mg total) by mouth daily. 30 tablet 0  . nitroGLYCERIN (NITROSTAT) 0.4 MG SL tablet Place 1 tablet (0.4 mg total) under the tongue every 5 (five) minutes as needed  for chest pain. 30 tablet 0  . omeprazole (PRILOSEC) 20 MG capsule Take 1 capsule (20 mg total) by mouth 2 (two) times daily before a meal.    . oxybutynin (DITROPAN) 5 MG tablet Take 1 tablet (5 mg total) by mouth 3 (three) times daily. 20 tablet 0  . sevelamer carbonate (RENVELA) 800 MG tablet Take 800-1,600 mg by mouth 3 (three) times daily with meals. Patient takes 2 tablets with meals and 1 tablet with snack    . vitamin C (ASCORBIC ACID) 500 MG tablet Take 500 mg by mouth daily.     No current facility-administered medications for this visit.    Discontinued Meds:    Medications Discontinued During This Encounter  Medication Reason  . docusate sodium (COLACE) 100 MG capsule Error  . colchicine 0.6 MG tablet Error  . cephALEXin (KEFLEX) 500 MG capsule Error    Patient Active Problem List   Diagnosis Date Noted  . Acute chest pain 11/16/2014  . Atherosclerosis of native arteries of the extremities with gangrene ([place]) 04/20/2013  . Sepsis ([place]) 04/13/2013  . Gram-negative bacteremia ([place]) 04/12/2013  . ESRD on  dialysis ([place]) 04/09/2013  . Diabetic osteomyelitis ([place]) 04/09/2013  . Protein-calorie malnutrition, severe ([place]) 04/09/2013  . Gangrene of toe ([place]) 04/08/2013  . Toe gangrene ([place]) 04/08/2013  . Nausea with vomiting 05/22/2012  . S/P BKA (below knee amputation) unilateral, left 05/20/2012  . Musculoskeletal chest pain 05/20/2012  . Acute renal failure ([place]) 05/19/2012  . CAD (coronary artery disease) 05/19/2012  . DM type 2 causing vascular disease ([place]) 05/19/2012  . HTN (hypertension), benign 05/19/2012  . Hyperkalemia 05/19/2012    LABS    Component Value Date/Time   NA 135 11/18/2014 0529   NA 131* 11/16/2014 1221   NA 125* 11/08/2014 1507   NA 136 09/03/2012 0934   NA 133* 07/02/2012 1355   K 3.9 11/18/2014 0529   K 2.7* 11/16/2014 1221   K 3.1* 11/08/2014 1507   K 3.9 09/03/2012 0934   K 3.8 07/02/2012 1355   CL 99* 11/18/2014 0529   CL 93*  11/16/2014 1221   CL 88* 11/08/2014 1507   CL 100 09/03/2012 0934   CL 97* 07/02/2012 1355   CO2 26 11/18/2014 0529   CO2 26 11/16/2014 1221   CO2 26 11/08/2014 1507   CO2 30 09/03/2012 0934   CO2 28 07/02/2012 1355   GLUCOSE 122* 11/18/2014 0529   GLUCOSE 124* 11/16/2014 1221   GLUCOSE 167* 11/08/2014 1507   GLUCOSE 110* 09/03/2012 0934   GLUCOSE 129* 07/02/2012 1355   BUN 55* 11/18/2014 0529   BUN 36* 11/16/2014 1221   BUN 72* 11/08/2014 1507   BUN 46* 09/03/2012 0934   BUN 39* 07/02/2012 1355   CREATININE 5.85* 11/18/2014 0529   CREATININE 3.87* 11/16/2014 1221   CREATININE 7.61* 11/08/2014 1507   CREATININE 6.12* 09/03/2012 0934   CREATININE 5.35* 07/02/2012 1355   CALCIUM 8.0* 11/18/2014 0529   CALCIUM 7.2* 11/16/2014 1221   CALCIUM 7.4* 11/08/2014 1507   CALCIUM 9.4 09/03/2012 0934   CALCIUM 9.3 07/02/2012 1355   CALCIUM 7.7* 05/20/2012 1530   GFRNONAA 8* 11/18/2014 0529   GFRNONAA 14* 11/16/2014 1221   GFRNONAA 6* 11/08/2014 1507   GFRNONAA 8* 09/03/2012 0934   GFRNONAA 10* 07/02/2012 1355   GFRAA 10* 11/18/2014 0529   GFRAA 16* 11/16/2014 1221   GFRAA 7* 11/08/2014 1507   GFRAA 10* 09/03/2012 0934   GFRAA 11* 07/02/2012 1355   CMP     Component Value Date/Time   NA 135 11/18/2014 0529   NA 136 09/03/2012 0934   K 3.9 11/18/2014 0529   K 3.9 09/03/2012 0934   CL 99* 11/18/2014 0529   CL 100 09/03/2012 0934   CO2 26 11/18/2014 0529   CO2 30 09/03/2012 0934   GLUCOSE 122* 11/18/2014 0529   GLUCOSE 110* 09/03/2012 0934   BUN 55* 11/18/2014 0529   BUN 46* 09/03/2012 0934   CREATININE 5.85* 11/18/2014 0529   CREATININE 6.12* 09/03/2012 0934   CALCIUM 8.0* 11/18/2014 0529   CALCIUM 9.4 09/03/2012 0934   CALCIUM 7.7* 05/20/2012 1530   PROT 6.1* 11/08/2014 1507   ALBUMIN 2.2* 11/08/2014 1507   AST 16 11/08/2014 1507   ALT 9* 11/08/2014 1507   ALKPHOS 59 11/08/2014 1507   BILITOT 0.7 11/08/2014 1507   GFRNONAA 8* 11/18/2014 0529   GFRNONAA 8*  09/03/2012 0934   GFRAA 10* 11/18/2014 0529   GFRAA 10* 09/03/2012 0934       Component Value Date/Time   WBC 9.9 11/18/2014 1020   WBC 12.3* 11/16/2014 1221   WBC 11.0*  11/08/2014 1507   WBC 8.0 07/02/2012 1355   HGB 8.2* 11/18/2014 1020   HGB 8.6* 11/16/2014 1221   HGB 9.0* 11/08/2014 1507   HGB 11.7* 07/02/2012 1355   HCT 24.0* 11/18/2014 1020   HCT 25.1* 11/16/2014 1221   HCT 25.7* 11/08/2014 1507   HCT 35.8* 07/02/2012 1355   MCV 93.8 11/18/2014 1020   MCV 93.7 11/16/2014 1221   MCV 91.5 11/08/2014 1507   MCV 90 07/02/2012 1355    Lipid Panel     Component Value Date/Time   CHOL 245* 11/18/2014 0533   TRIG 175* 11/18/2014 0533   HDL 30* 11/18/2014 0533   CHOLHDL 8.2 11/18/2014 0533   VLDL 35 11/18/2014 0533   LDLCALC 180* 11/18/2014 0533    ABG No results found for: PHART, PCO2ART, PO2ART, HCO3, TCO2, ACIDBASEDEF, O2SAT   Lab Results  Component Value Date   TSH 0.733 05/19/2012   BNP (last 3 results) No results for input(s): BNP in the last 8760 hours.  ProBNP (last 3 results) No results for input(s): PROBNP in the last 8760 hours.  Cardiac Panel (last 3 results) No results for input(s): CKTOTAL, CKMB, TROPONINI, RELINDX in the last 72 hours.  Iron/TIBC/Ferritin/ %Sat    Component Value Date/Time   IRON 11* 04/09/2013 1600   TIBC 153* 04/09/2013 1600   FERRITIN 380* 05/20/2012 0527   IRONPCTSAT 7* 04/09/2013 1600     EKG Orders placed or performed during the hospital encounter of 11/16/14  . EKG 12-Lead  . EKG 12-Lead  . EKG 12-Lead  . EKG 12-Lead  . EKG 12-Lead  . EKG     Prior Assessment and Plan Problem List as of 12/22/2014      Cardiovascular and Mediastinum   CAD (coronary artery disease)   DM type 2 causing vascular disease (HCC)   HTN (hypertension), benign   Atherosclerosis of native arteries of the extremities with gangrene (HCC)     Digestive   Nausea with vomiting     Endocrine   Diabetic osteomyelitis ([place])      Genitourinary   Acute renal failure ([place])   ESRD on dialysis ([place])     Other   Hyperkalemia   S/P BKA (below knee amputation) unilateral, left   Musculoskeletal chest pain   Gangrene of toe (HCC)   Toe gangrene (HCC)   Protein-calorie malnutrition, severe (HCC)   Gram-negative bacteremia ([place])   Sepsis ([place])   Acute chest pain       Imaging: No results found.

## 2014-12-26 ENCOUNTER — Telehealth: Payer: Self-pay | Admitting: Adult Health

## 2014-12-26 NOTE — Telephone Encounter (Signed)
May not be the atorvastatin but he can continue to hold this. Should see PCP for other recommendations please.

## 2014-12-26 NOTE — Telephone Encounter (Signed)
Spoke with wife,Sarah continue to hold atorvastatin and see pcp

## 2014-12-26 NOTE — Telephone Encounter (Signed)
Has been of Atorvastatin 4 days and wife states his weakness is about 40 % better.They still have to assist him most of the time to sit up and move around.     Benyamin update Ms Purcell Nails NP

## 2014-12-26 NOTE — Telephone Encounter (Signed)
Per patient's wife for status update per Curt Bears post stopping cholestrol meds / tg

## 2015-01-11 ENCOUNTER — Other Ambulatory Visit: Payer: Self-pay | Admitting: Adult Health

## 2015-02-07 ENCOUNTER — Ambulatory Visit: Payer: Medicare Other | Admitting: Urology

## 2015-03-01 ENCOUNTER — Ambulatory Visit (HOSPITAL_COMMUNITY): Payer: Medicare Other | Admitting: Specialist

## 2015-03-07 ENCOUNTER — Ambulatory Visit (HOSPITAL_COMMUNITY): Payer: Medicare Other | Attending: Family Medicine

## 2015-03-07 ENCOUNTER — Encounter (HOSPITAL_COMMUNITY): Payer: Self-pay

## 2015-03-07 DIAGNOSIS — R531 Weakness: Secondary | ICD-10-CM

## 2015-03-07 NOTE — Therapy (Addendum)
[facility] [address] [place], [place], [address] Phone: [phone]   Fax:  [phone]  Occupational Therapy Wheelchair Evaluation  Patient Details  Name: Alan [name] MRN: [id number] Date of Birth: 1940-02-26 Referring Provider: Terance Hart  Encounter Date: 03/07/2015      OT End of Session - 03/07/15 1010    Visit Number 1   Number of Visits 1   Authorization Type UHC Medicare and AARP complete   OT Start Time 0850   OT Stop Time 0930   OT Time Calculation (min) 40 min   Activity Tolerance Patient tolerated treatment well   Behavior During Therapy [facility] for tasks assessed/performed      Past Medical History  Diagnosis Date  . Diabetes mellitus   . Hypertension   . Gout   . Coronary artery disease 2000    Stents [facility]  . Stroke ([place])   . Asthma   . Hypercholesteremia   . Arthritis   . DDD (degenerative disc disease), lumbar   . DDD (degenerative disc disease), cervical   . Gangrene of toe ([place]) Feb. 2015    Right  great    . Gastric ulcer     EGD 2014  . 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 Clotted renal dialysis arteriovenous graft ([place])   . CVA (cerebral infarction)   . ESRD (end stage renal disease) on dialysis [facility])     Past Surgical History  Procedure Laterality Date  . Coronary artery bypass graft  1996    [facility]  . Below knee leg amputation Bilateral   . Esophagogastroduodenoscopy Left 05/24/2012    [id number] dilated baggy but otherwise a normal/ Small hiatal hernia. Gastric ulcer -S/P biopsy  . Amputation Right 04/10/2013    Procedure: Right Below Knee Amputation;  Surgeon: Newt Minion, MD;  Location: [facility];  Service: Orthopedics;  Laterality: Right;  Right Below Knee Amputation  . Appendectomy    . Peripheral vascular catheterization N/A 08/09/2014    Procedure: A/V Shuntogram/Fistulagram;  Surgeon: Katha Cabal, MD;  Location: [facility];  Service: Cardiovascular;   Laterality: N/A;  . Peripheral vascular catheterization Left 08/09/2014    Procedure: A/V Shunt Intervention;  Surgeon: Katha Cabal, MD;  Location: [place] [facility];  Service: Cardiovascular;  Laterality: Left;  . Cholecystectomy      There were no vitals filed for this visit.  Visit Diagnosis:  Weakness generalized      Subjective Assessment - 03/07/15 1009    Currently in Pain? Yes   Pain Score 5    Pain Location Shoulder   Pain Orientation Left;Right   Pain Descriptors / Indicators Aching;Constant   Pain Type Acute pain   Pain Onset More than a month ago   Pain Frequency Constant           [facility] OT Assessment - 03/07/15 1013    Assessment   Diagnosis Wheelchair Evaluation   Referring Provider Terance Hart       Date: 03/07/15 Patient Name: Alan [name] Address: [address], [place], [place] [id number] DOB: [birth year]   To Whom It May Concern,    Alan [name] was seen today in this clinic for a power wheelchair evaluation. Alan [name] has a past medical history that includes Diabetic osteomyelitis, toe gangrene, sepsis, malnutrition, gout, coronary artery disease, DM II, HTN, artherosclerosis of native arteries of the extremities with gangrene, acute renal failure, hyperkalemia, R CVA (reports two occurring in the 60's),  degenerative disc disease (cervical and lumbar), arthritis, C7 cervical cyst and ESRD on dialysis (M-W-F). 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   Since Alan [name]'s first amputation in 2006 he has been wheelchair bound using a manual wheelchair primarily. Alan [name] reports that within the past few months he has suddenly lost active ROM and strength in bilateral shoulders and is unable to self-propel his wheelchair. Alan [name] is completely dependent for wheelchair mobility.   Ashaad Prowell lives with his wife in a 1 story home with a ramped entrance. Alan [name] receives Total assist with  bathing and dressing at bed level since the recent decline in bilateral shoulder function. Alan [name] completes sponge bathes as his bathroom is not handicap accessible. Alan [name] using a bedside commode for bowel and bladder use.    A FULL PHYSICAL ASSESSMENT REVEALS THE FOLLOWING    Existing Equipment:   Alan [name] owns a manual wheelchair, slideboard, and hoyer lift.   Transfers:  Alan [name] is a total assist for transfers using the hoyer lift at home and at dialysis.  Head and Neck:  AROM WFL   Trunk and Pelvis: AROM WFL    Hip: AROM WFL. Left: 3+/5 Right: 3+/5  Knees:  AROM WFL   Feet and Ankles: N/A  Upper Extremities: No AROM in bilateral shoulders. PROM is [facility]. AROM of bilateral elbows and wrist is WFL. Right shoulder MMT: 1/5 (flexion, horizontal abduction, external and internal rotation). Left shoulder MMT: 1/5 (flexion, horizontal abduction, external and internal rotation). Impaired gross grasp: right hand. Chronic left hand deformities (ongoing for ~2 years). No extension in 4th and 5th digits. No flexion in 2nd and 3rd digits. Functional gross grasp in right hand. No gross grasp in left hand. Patient is unable to form a complete fist with left hand.   Weight Shifting Ability:  Alan [name] is able to weight shift left in right in present manual chair.   Skin Integrity:  No history of skin breakdown. Mr. Douge does not have a chair cushion in his manual wheelchair and reports a sensitive area on his tailbone.   Cognition:  WFL  Activity Tolerance: Poor. Alan [name] is wheelchair bound and non-ambulatory. Alan [name] is unable to actively raise his shoulders and is only able to complete tasks at waist level.   GOALS/OBJECTIVE OF SEATING INTERVENTION:  Recommendation: Alan [name] would benefit from a new power wheelchair for use in his home and in the community. He is unable to propel a standard wheelchair due to decreased BUE strength and shoulder AROM and activity tolerance. A  scooter would not be beneficial for Mr [name]. He does not have the adequate space needed in his home for the wide turning radius required by the scooter.  Mr. Gumm is motivated and willing to use his power wheelchair and is physically and mentally able to operate a power wheelchair. A power wheelchair would increase Mr. Mundis's safety and independence with daily activities and his quality of life.    [facility] Manufacturer Mfg's Item # Description   [id number] Quantum Q6 EDGE 2.0 3MP-SS Q6 EDGE 2.0 3MP-SS Power Wheelchair  [id number] Quantum [id number] NF-22 Battery (x2)  [id number] Quantum [id number] Swing-away Joystick  [id number] Quantum [id number] Tru-Balance Tilt & Recline, 300lb cap  [id number] Quantum [id number] Large Contoured Headrest Pad  [id number] Quantum [id number] Stealth Headrest Mounting Hdware  [id number] Quantum [id number] Multipower Option Through Froid  Support Pad (x2)  [place] [id number] M2 Anti Thrust Cushion w/ Quadra Gel     If you require any further information concerning Miss Major's positioning, independence or mobility needs; or any further information why a lesser device Liborio not work, please do not hesitate to contact me at [place], [place] [place], [place] [address] [phone].    Thank you for this referral, ____________________        Ailene Ravel OTR/L, CBIS                    Plan - 03/17/2015 1011    OT Frequency One time visit   OT Treatment/Interventions Patient/family education   Consulted and Agree with Plan of Care Patient          G-Codes - 03/17/15 1011    Functional Assessment Tool Used clinical judgement   Functional Limitation Self care   Self Care Current Status [phone]) At least 80 percent but less than 100 percent impaired, limited or restricted   Self Care Goal Status [id number]) At least 80 percent but less than 100 percent impaired, limited or  restricted   Self Care Discharge Status [phone]) At least 80 percent but less than 100 percent impaired, limited or restricted      Problem List Patient Active Problem List   Diagnosis Date Noted  . Acute chest pain 11/16/2014  . Atherosclerosis of native arteries of the extremities with gangrene ([place]) 04/20/2013  . Sepsis ([place]) 04/13/2013  . Gram-negative bacteremia ([place]) 04/12/2013  . ESRD on dialysis ([place]) 04/09/2013  . Diabetic osteomyelitis ([place]) 04/09/2013  . Protein-calorie malnutrition, severe ([place]) 04/09/2013  . Gangrene of toe ([place]) 04/08/2013  . Toe gangrene ([place]) 04/08/2013  . Nausea with vomiting 05/22/2012  . S/P BKA (below knee amputation) unilateral, left 05/20/2012  . Musculoskeletal chest pain 05/20/2012  . Acute renal failure ([place]) 05/19/2012  . CAD (coronary artery disease) 05/19/2012  . DM type 2 causing vascular disease ([place]) 05/19/2012  . HTN (hypertension), benign 05/19/2012  . Hyperkalemia 05/19/2012    Ailene Ravel, OTR/L,CBIS  [phone]  2015/03/17, 10:14 AM  [facility] [address] [place], [place], [address] Phone: [phone]   Fax:  [phone]  Name: Jennifer Quintos MRN: [id number] Date of Birth: November 01, 1940

## 2015-04-11 ENCOUNTER — Ambulatory Visit (INDEPENDENT_AMBULATORY_CARE_PROVIDER_SITE_OTHER): Payer: Medicare Other | Admitting: Urology

## 2015-04-11 DIAGNOSIS — N5201 Erectile dysfunction due to arterial insufficiency: Secondary | ICD-10-CM

## 2015-04-11 DIAGNOSIS — R32 Unspecified urinary incontinence: Secondary | ICD-10-CM | POA: Diagnosis not present

## 2015-04-11 DIAGNOSIS — N189 Chronic kidney disease, unspecified: Secondary | ICD-10-CM

## 2015-04-11 DIAGNOSIS — N3020 Other chronic cystitis without hematuria: Secondary | ICD-10-CM

## 2015-04-12 ENCOUNTER — Observation Stay (HOSPITAL_COMMUNITY)
Admission: EM | Admit: 2015-04-12 | Discharge: 2015-04-15 | Disposition: A | Discharge status: Home / Self Care | Payer: Medicare Other | Attending: Internal Medicine | Admitting: Internal Medicine

## 2015-04-12 ENCOUNTER — Encounter (HOSPITAL_COMMUNITY): Payer: Self-pay | Admitting: Emergency Medicine

## 2015-04-12 ENCOUNTER — Emergency Department (HOSPITAL_COMMUNITY): Payer: Medicare Other

## 2015-04-12 DIAGNOSIS — I10 Essential (primary) hypertension: Secondary | ICD-10-CM | POA: Diagnosis present

## 2015-04-12 DIAGNOSIS — Z87891 Personal history of nicotine dependence: Secondary | ICD-10-CM | POA: Diagnosis not present

## 2015-04-12 DIAGNOSIS — I2510 Atherosclerotic heart disease of native coronary artery without angina pectoris: Secondary | ICD-10-CM | POA: Diagnosis not present

## 2015-04-12 DIAGNOSIS — K567 Ileus, unspecified: Secondary | ICD-10-CM | POA: Insufficient documentation

## 2015-04-12 DIAGNOSIS — Z79899 Other long term (current) drug therapy: Secondary | ICD-10-CM | POA: Insufficient documentation

## 2015-04-12 DIAGNOSIS — R112 Nausea with vomiting, unspecified: Secondary | ICD-10-CM | POA: Diagnosis not present

## 2015-04-12 DIAGNOSIS — K259 Gastric ulcer, unspecified as acute or chronic, without hemorrhage or perforation: Secondary | ICD-10-CM | POA: Diagnosis not present

## 2015-04-12 DIAGNOSIS — Z89512 Acquired absence of left leg below knee: Secondary | ICD-10-CM

## 2015-04-12 DIAGNOSIS — Z8673 Personal history of transient ischemic attack (TIA), and cerebral infarction without residual deficits: Secondary | ICD-10-CM | POA: Diagnosis not present

## 2015-04-12 DIAGNOSIS — J45909 Unspecified asthma, uncomplicated: Secondary | ICD-10-CM | POA: Insufficient documentation

## 2015-04-12 DIAGNOSIS — I959 Hypotension, unspecified: Secondary | ICD-10-CM | POA: Diagnosis present

## 2015-04-12 DIAGNOSIS — E785 Hyperlipidemia, unspecified: Secondary | ICD-10-CM | POA: Diagnosis present

## 2015-04-12 DIAGNOSIS — R197 Diarrhea, unspecified: Secondary | ICD-10-CM | POA: Insufficient documentation

## 2015-04-12 DIAGNOSIS — M5030 Other cervical disc degeneration, unspecified cervical region: Secondary | ICD-10-CM | POA: Diagnosis not present

## 2015-04-12 DIAGNOSIS — E119 Type 2 diabetes mellitus without complications: Secondary | ICD-10-CM | POA: Insufficient documentation

## 2015-04-12 DIAGNOSIS — Z951 Presence of aortocoronary bypass graft: Secondary | ICD-10-CM | POA: Diagnosis not present

## 2015-04-12 DIAGNOSIS — Z89511 Acquired absence of right leg below knee: Secondary | ICD-10-CM

## 2015-04-12 DIAGNOSIS — K529 Noninfective gastroenteritis and colitis, unspecified: Secondary | ICD-10-CM | POA: Diagnosis not present

## 2015-04-12 DIAGNOSIS — M5136 Other intervertebral disc degeneration, lumbar region: Secondary | ICD-10-CM | POA: Insufficient documentation

## 2015-04-12 DIAGNOSIS — N186 End stage renal disease: Secondary | ICD-10-CM | POA: Diagnosis not present

## 2015-04-12 DIAGNOSIS — M109 Gout, unspecified: Secondary | ICD-10-CM | POA: Insufficient documentation

## 2015-04-12 DIAGNOSIS — I120 Hypertensive chronic kidney disease with stage 5 chronic kidney disease or end stage renal disease: Principal | ICD-10-CM | POA: Insufficient documentation

## 2015-04-12 DIAGNOSIS — Z7982 Long term (current) use of aspirin: Secondary | ICD-10-CM | POA: Insufficient documentation

## 2015-04-12 LAB — COMPREHENSIVE METABOLIC PANEL
ALT: 11 U/L — ABNORMAL LOW (ref 17–63)
AST: 22 U/L (ref 15–41)
Albumin: 2.5 g/dL — ABNORMAL LOW (ref 3.5–5.0)
Alkaline Phosphatase: 64 U/L (ref 38–126)
Anion gap: 16 — ABNORMAL HIGH (ref 5–15)
BILIRUBIN TOTAL: 1 mg/dL (ref 0.3–1.2)
BUN: 53 mg/dL — ABNORMAL HIGH (ref 6–20)
CHLORIDE: 98 mmol/L — AB (ref 101–111)
CO2: 25 mmol/L (ref 22–32)
Calcium: 7.6 mg/dL — ABNORMAL LOW (ref 8.9–10.3)
Creatinine, Ser: 5.8 mg/dL — ABNORMAL HIGH (ref 0.61–1.24)
GFR, EST AFRICAN AMERICAN: 10 mL/min — AB (ref >60)
GFR, EST NON AFRICAN AMERICAN: 8 mL/min — AB (ref >60)
Glucose, Bld: 129 mg/dL — ABNORMAL HIGH (ref 65–99)
POTASSIUM: 3.4 mmol/L — AB (ref 3.5–5.1)
Sodium: 139 mmol/L (ref 135–145)
TOTAL PROTEIN: 6.2 g/dL — AB (ref 6.5–8.1)

## 2015-04-12 LAB — CBG MONITORING, ED: GLUCOSE-CAPILLARY: 130 mg/dL — AB (ref 65–99)

## 2015-04-12 LAB — CBC WITH DIFFERENTIAL/PLATELET
Basophils Absolute: 0 10*3/uL (ref 0.0–0.1)
Basophils Relative: 0 %
EOS PCT: 1 %
Eosinophils Absolute: 0.1 10*3/uL (ref 0.0–0.7)
HEMATOCRIT: 30.9 % — AB (ref 39.0–52.0)
Hemoglobin: 10.5 g/dL — ABNORMAL LOW (ref 13.0–17.0)
LYMPHS ABS: 1.3 10*3/uL (ref 0.7–4.0)
Lymphocytes Relative: 13 %
MCH: 31.7 pg (ref 26.0–34.0)
MCHC: 34 g/dL (ref 30.0–36.0)
MCV: 93.4 fL (ref 78.0–100.0)
MONO ABS: 0.3 10*3/uL (ref 0.1–1.0)
Monocytes Relative: 3 %
Neutro Abs: 8.9 10*3/uL — ABNORMAL HIGH (ref 1.7–7.7)
Neutrophils Relative %: 83 %
PLATELETS: 210 10*3/uL (ref 150–400)
RBC: 3.31 MIL/uL — ABNORMAL LOW (ref 4.22–5.81)
RDW: 14.6 % (ref 11.5–15.5)
WBC: 10.6 10*3/uL — AB (ref 4.0–10.5)

## 2015-04-12 LAB — MRSA PCR SCREENING: MRSA by PCR: NEGATIVE

## 2015-04-12 LAB — LIPASE, BLOOD: LIPASE: 34 U/L (ref 11–51)

## 2015-04-12 MED ORDER — ATORVASTATIN CALCIUM 40 MG PO TABS
80.0000 mg | ORAL_TABLET | Freq: Every day | ORAL | Status: DC
Start: 2015-04-12 — End: 2015-04-15
  Administered 2015-04-12 – 2015-04-15 (×4): 80 mg via ORAL
  Filled 2015-04-12 (×2): qty 2
  Filled 2015-04-12 (×2): qty 4

## 2015-04-12 MED ORDER — HEPARIN SODIUM (PORCINE) 5000 UNIT/ML IJ SOLN
5000.0000 [IU] | Freq: Three times a day (TID) | INTRAMUSCULAR | Status: DC
  Administered 2015-04-12 – 2015-04-15 (×6): 5000 [IU] via SUBCUTANEOUS
  Filled 2015-04-12 (×6): qty 1

## 2015-04-12 MED ORDER — PANTOPRAZOLE SODIUM 40 MG PO TBEC
40.0000 mg | DELAYED_RELEASE_TABLET | Freq: Two times a day (BID) | ORAL | Status: DC
Start: 2015-04-12 — End: 2015-04-15
  Administered 2015-04-12 – 2015-04-15 (×7): 40 mg via ORAL
  Filled 2015-04-12 (×7): qty 1

## 2015-04-12 MED ORDER — ACETAMINOPHEN 650 MG RE SUPP: 650.0000 mg | Freq: Four times a day (QID) | RECTAL | Status: DC | PRN

## 2015-04-12 MED ORDER — SODIUM CHLORIDE 0.9 % IV BOLUS (SEPSIS)
500.0000 mL | Freq: Once | INTRAVENOUS | Status: AC
  Administered 2015-04-12: 500 mL via INTRAVENOUS

## 2015-04-12 MED ORDER — PANTOPRAZOLE SODIUM 40 MG IV SOLR
40.0000 mg | Freq: Once | INTRAVENOUS | Status: AC
  Administered 2015-04-12: 40 mg via INTRAVENOUS
  Filled 2015-04-12: qty 40

## 2015-04-12 MED ORDER — SODIUM CHLORIDE 0.9 % IV BOLUS (SEPSIS)
250.0000 mL | Freq: Once | INTRAVENOUS | Status: AC
  Administered 2015-04-12: 250 mL via INTRAVENOUS

## 2015-04-12 MED ORDER — ONDANSETRON HCL 4 MG/2ML IJ SOLN
4.0000 mg | Freq: Once | INTRAMUSCULAR | Status: AC
  Administered 2015-04-12: 4 mg via INTRAVENOUS
  Filled 2015-04-12: qty 2

## 2015-04-12 MED ORDER — SODIUM CHLORIDE 0.9% FLUSH
3.0000 mL | Freq: Two times a day (BID) | INTRAVENOUS | Status: DC
  Administered 2015-04-12 – 2015-04-13 (×3): 3 mL via INTRAVENOUS

## 2015-04-12 MED ORDER — FENTANYL CITRATE (PF) 100 MCG/2ML IJ SOLN
50.0000 ug | Freq: Once | INTRAMUSCULAR | Status: AC
  Administered 2015-04-12: 50 ug via INTRAVENOUS
  Filled 2015-04-12: qty 2

## 2015-04-12 MED ORDER — SODIUM CHLORIDE 0.9% FLUSH: 3.0000 mL | INTRAVENOUS | Status: DC | PRN

## 2015-04-12 MED ORDER — ONDANSETRON HCL 4 MG/2ML IJ SOLN: 4.0000 mg | Freq: Four times a day (QID) | INTRAMUSCULAR | Status: DC | PRN

## 2015-04-12 MED ORDER — COLCHICINE 0.6 MG PO TABS: 0.6000 mg | ORAL_TABLET | Freq: Every day | ORAL | Status: DC

## 2015-04-12 MED ORDER — ACETAMINOPHEN 325 MG PO TABS
650.0000 mg | ORAL_TABLET | Freq: Four times a day (QID) | ORAL | Status: DC | PRN
  Administered 2015-04-13: 650 mg via ORAL
  Filled 2015-04-12: qty 2

## 2015-04-12 MED ORDER — SODIUM CHLORIDE 0.9% FLUSH
3.0000 mL | Freq: Two times a day (BID) | INTRAVENOUS | Status: DC
  Administered 2015-04-12 – 2015-04-15 (×4): 3 mL via INTRAVENOUS

## 2015-04-12 MED ORDER — DIATRIZOATE MEGLUMINE & SODIUM 66-10 % PO SOLN
ORAL | Status: AC
  Filled 2015-04-12: qty 30

## 2015-04-12 MED ORDER — POTASSIUM CHLORIDE CRYS ER 20 MEQ PO TBCR: 40.0000 meq | EXTENDED_RELEASE_TABLET | Freq: Once | ORAL | Status: DC

## 2015-04-12 MED ORDER — SODIUM CHLORIDE 0.9 % IV SOLN: 250.0000 mL | INTRAVENOUS | Status: DC | PRN

## 2015-04-12 MED ORDER — ONDANSETRON HCL 4 MG PO TABS: 4.0000 mg | ORAL_TABLET | Freq: Four times a day (QID) | ORAL | Status: DC | PRN

## 2015-04-12 MED ORDER — ASPIRIN EC 81 MG PO TBEC
81.0000 mg | DELAYED_RELEASE_TABLET | Freq: Every day | ORAL | Status: DC
  Administered 2015-04-12 – 2015-04-14 (×3): 81 mg via ORAL
  Filled 2015-04-12 (×3): qty 1

## 2015-04-12 NOTE — H&P (Signed)
[facility] History and Physical  Darey Hitchner [id number] DOB: May 28, 1940 DOA: 04/12/2015  Referring physician: Dr. Lacinda Axon, ER PCP: PROVIDER NOT IN SYSTEM   Chief Complaint: abdominal pain  HPI: Alan [name] is a 75 y.o. male was a history of end-stage renal disease on hemodialysis, was traveling to hemodialysis this morning when he developed onset of peri umbilical abdominal pain. He reports this as a sharp pain. He had associated vomiting and reports throwing up several times today. He reported 6-7 loose stools today. Denies any fever. All his symptoms started today, he was in his usual state of health yesterday. Denies any sick contacts. He's not eaten in any restaurants lately. He has not had any chest pain, shortness of breath. He presented to the dialysis center and was immediately referred to the emergency room for evaluation. While in the ED, he was noted to be hypotensive on arrival. He received gentle hydration with improvement of his blood pressure. CT of the abdomen and pelvis showed possible inflammatory ileus, no small bowel dilatation to strongly indicate mechanical bowel obstruction. 2 small ventral abdominal hernias were noted. One hernia was felt to have a possible small incarcerated portion of the transverse colon, but this in of itself should not be obstructing. The patient has been admitted for further treatments.   Review of Systems:  Pertinent positives as per HPI, otherwise negative  Past Medical History  Diagnosis Date  . Diabetes mellitus   . Hypertension   . Gout   . Coronary artery disease 2000    Stents [facility]  . Stroke ([place])   . Asthma   . Hypercholesteremia   . Arthritis   . DDD (degenerative disc disease), lumbar   . DDD (degenerative disc disease), cervical   . Gangrene of toe ([place]) Feb. 2015    Right  great    . Gastric ulcer     EGD 2014  . 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 Clotted renal dialysis arteriovenous graft  ([place])   . CVA (cerebral infarction)   . ESRD (end stage renal disease) on dialysis [facility])    Past Surgical History  Procedure Laterality Date  . Coronary artery bypass graft  1996    [facility]  . Below knee leg amputation Bilateral   . Esophagogastroduodenoscopy Left 05/24/2012    [id number] dilated baggy but otherwise a normal/ Small hiatal hernia. Gastric ulcer -S/P biopsy  . Amputation Right 04/10/2013    Procedure: Right Below Knee Amputation;  Surgeon: Newt Minion, MD;  Location: [facility];  Service: Orthopedics;  Laterality: Right;  Right Below Knee Amputation  . Appendectomy    . Peripheral vascular catheterization N/A 08/09/2014    Procedure: A/V Shuntogram/Fistulagram;  Surgeon: Katha Cabal, MD;  Location: [place] [facility];  Service: Cardiovascular;  Laterality: N/A;  . Peripheral vascular catheterization Left 08/09/2014    Procedure: A/V Shunt Intervention;  Surgeon: Katha Cabal, MD;  Location: [place] [facility];  Service: Cardiovascular;  Laterality: Left;  . Cholecystectomy     Social History:  reports that he quit smoking about 10 years ago. He has never used smokeless tobacco. He reports that he does not drink alcohol or use illicit drugs.  Allergies  Allergen Reactions  . Codeine Itching and Nausea And Vomiting  . [place] Venom [Bee Venom] Swelling    Family History  Problem Relation Age of Onset  . Colon cancer Neg Hx   . Cancer Mother     Prior  to Admission medications   Medication Sig Start Date End Date Taking? Authorizing Provider  acetaminophen (TYLENOL) 500 MG tablet Take 500 mg by mouth 2 (two) times daily.   Yes Historical Provider, MD  Amino Acid Infusion (PROSOL) 20 % SOLN every Monday, Wednesday, and Friday.  10/27/14  Yes Historical Provider, MD  amLODipine (NORVASC) 5 MG tablet Take 1 tablet (5 mg total) by mouth daily. 12/22/14  Yes Lendon Colonel, NP  aspirin EC 81 MG tablet Take 81 mg by mouth at bedtime.    Yes Historical  Provider, MD  atorvastatin (LIPITOR) 80 MG tablet Take 1 tablet (80 mg total) by mouth daily at 6 PM. 11/18/14  Yes Samuella Cota, MD  B Complex-C-Folic Acid (NEPHRO-VITE PO) Take 1 tablet by mouth every evening.    Yes Historical Provider, MD  colchicine 0.6 MG tablet Take 0.6 mg by mouth daily.   Yes Historical Provider, MD  furosemide (LASIX) 40 MG tablet Take 0.5 tablets (20 mg total) by mouth every evening. 12/22/14  Yes Lendon Colonel, NP  LANTUS SOLOSTAR 100 UNIT/ML Solostar Pen Inject 6 Units into the skin at bedtime. 12/21/13  Yes Historical Provider, MD  lisinopril (PRINIVIL,ZESTRIL) 5 MG tablet Take 1 tablet (5 mg total) by mouth daily. 12/22/14  Yes Lendon Colonel, NP  omeprazole (PRILOSEC) 20 MG capsule Take 1 capsule (20 mg total) by mouth 2 (two) times daily before a meal. 06/17/13  Yes Radene Gunning, NP  vitamin C (ASCORBIC ACID) 500 MG tablet Take 500 mg by mouth daily.   Yes Historical Provider, MD  nitroGLYCERIN (NITROSTAT) 0.4 MG SL tablet Place 1 tablet (0.4 mg total) under the tongue every 5 (five) minutes as needed for chest pain. 11/18/14   Samuella Cota, MD   Physical Exam: Filed Vitals:   04/12/15 1445 04/12/15 1500 04/12/15 1515 04/12/15 1556  BP: 111/57 116/53 115/55 114/71  Pulse: 78 77 81 78  Temp:      TempSrc:      Resp: 15 12 14 16   Height:    5\' 9"  (1.753 m)  Weight:    73.4 kg (161 lb 13.1 oz)  SpO2: 92% 97% 98% 97%    Wt Readings from Last 3 Encounters:  04/12/15 73.4 kg (161 lb 13.1 oz)  11/18/14 79 kg (174 lb 2.6 oz)  11/08/14 79.379 kg (175 lb)    General:  Appears calm and comfortable Eyes: PERRL, normal lids, irises & conjunctiva ENT: grossly normal hearing, lips & tongue Neck: no LAD, masses or thyromegaly Cardiovascular: RRR, no m/r/g. No LE edema. Telemetry: SR, no arrhythmias  Respiratory: CTA bilaterally, no w/r/r. Normal respiratory effort. Abdomen: soft, tender in midline, bs+ Skin: no rash or induration seen on limited  exam Musculoskeletal: bilateral BKA Psychiatric: grossly normal mood and affect, speech fluent and appropriate Neurologic: grossly non-focal.          Labs on Admission:  Basic Metabolic Panel:  Recent Labs Lab 04/12/15 1021  NA 139  K 3.4*  CL 98*  CO2 25  GLUCOSE 129*  BUN 53*  CREATININE 5.80*  CALCIUM 7.6*   Liver Function Tests:  Recent Labs Lab 04/12/15 1021  AST 22  ALT 11*  ALKPHOS 64  BILITOT 1.0  PROT 6.2*  ALBUMIN 2.5*    Recent Labs Lab 04/12/15 1021  LIPASE 34   No results for input(s): AMMONIA in the last 168 hours. CBC:  Recent Labs Lab 04/12/15 1021  WBC 10.6*  NEUTROABS  8.9*  HGB 10.5*  HCT 30.9*  MCV 93.4  PLT 210   Cardiac Enzymes: No results for input(s): CKTOTAL, CKMB, CKMBINDEX, TROPONINI in the last 168 hours.  BNP (last 3 results) No results for input(s): BNP in the last 8760 hours.  ProBNP (last 3 results) No results for input(s): PROBNP in the last 8760 hours.  CBG:  Recent Labs Lab 04/12/15 1219  GLUCAP 130*    Radiological Exams on Admission: Ct Abdomen Pelvis Wo Contrast  04/12/2015  CLINICAL DATA:  75 year old male presenting from dialysis with hypotension. Initial encounter. EXAM: CT ABDOMEN AND PELVIS WITHOUT CONTRAST TECHNIQUE: Multidetector CT imaging of the abdomen and pelvis was performed following the standard protocol without IV contrast. COMPARISON:  Noncontrast CT Abdomen and Pelvis 05/19/2012 FINDINGS: Scarring/atelectasis at the left lung base has not significantly changed since 2014. No pericardial or pleural effusion. Minimal scarring or atelectasis at the right lung base. The thoracic esophagus is mildly distended with air in contrast. Scoliosis and advanced degenerative changes in the spine. Chronic erosions of the right proximal femur and acetabulum. There is some associated joint effusion, but this has a chronic appearance and has not significantly changed since 2014. Left hip joint degeneration.  No acute osseous abnormality identified. Fluid in the rectum. Mild presacral stranding, nonspecific. No pelvic free fluid. Diminutive urinary bladder. Fluid as well as some gas and stool in the sigmoid colon. Fluid throughout the left colon. Redundant splenic flexure is decompressed in areas but otherwise contains stool. There is a mix of fluid in stool in the transverse colon. The hepatic flexure is dilated with fluid (up to 6 cm diameter). The ascending colon is mildly dilated with fluid. There is a small fat containing sub xiphoid ventral abdominal hernia (series 2, image 29) but no bowel loops are affected. However, there is a small (2-3 cm) immediately supraumbilical hernia which does contain a portion of the transverse colon which contains fluid. See series 2, image 46). There is oral contrast throughout most of the small bowel which is decompressed throughout. The stomach is distended with oral contrast and gas. Negative duodenum. No abdominal free air or free fluid. Surgically absent gallbladder. Negative non contrast liver, spleen, pancreas (fatty infiltrated), and adrenal glands. Negative noncontrast kidneys and ureters. Aortoiliac calcified atherosclerosis noted. No lymphadenopathy. There is a chronic partially calcified small fluid collection in the lower rectus abdominal sheath (series 2, image 55) which is unchanged. IMPRESSION: 1. Dominant finding is fluid throughout much of the colon, which can be seen with diarrhea or enema use. No free air or free fluid. 2. Associated intermittent dilatation of the colon, maximal at the hepatic flexure and up to 6 cm diameter. Favor inflammatory ileus. See # 3. 3. There is no small bowel dilatation to strongly indicate a mechanical bowel obstruction. And while there are 2 small ventral abdominal hernias, only that near the umbilicus (series 2, image 46) may have a small incarcerated portion of the transverse colon but this of itself should not be obstructing.  Recommend physical exam correlation to see if this small paraumbilical hernia is reducible. 4. No other acute process identified in the abdomen or pelvis. Additional chronic findings detailed above. Electronically Signed   By: Genevie Ann M.D.   On: 04/12/2015 13:08    EKG: Independently reviewed. No acute changes  Assessment/Plan Active Problems:   HTN (hypertension), benign   Nausea with vomiting   ESRD (end stage renal disease) (HCC)   Gastroenteritis   S/P bilateral BKA (below  knee amputation) (HCC)   HLD (hyperlipidemia)   1. Nausea, vomiting, diarrhea, possible gastroenteritis. Treat supportively with antiemetics. Start on clear liquids. Alan [name] check stool for C. difficile. Advance diet as tolerated. 2. Abdominal pain. Possibly related to #1. CT scan shows ventral hernia. Discussed with Dr. Harriet Pho who Joanthony see the patient in the morning. Patient does not appear septic or toxic. 3. Hypotension. Likely related to volume depletion. Improved with IV fluids. Hold antihypertensives for now. 4. Hyperlipidemia. Continue statin 5. End-stage renal disease on hemodialysis. [facility] consult nephrology. Patient normally receives dialysis on Monday, Wednesday and Fridays. He was not dialyzed today due to his GI issues.  Code Status: full code DVT Prophylaxis: heparin Family Communication: discussed with patient Disposition Plan: discharge home once improved  Time spent: 51mins  Graycen Degan [facility] Pager [phone]

## 2015-04-12 NOTE — ED Notes (Signed)
MD at bedside. 

## 2015-04-12 NOTE — ED Notes (Signed)
Called wifeDorian Pod [phone] to update on patient's admission status.

## 2015-04-12 NOTE — ED Notes (Signed)
Pt vomiting, zofran given immediately after.

## 2015-04-12 NOTE — ED Provider Notes (Signed)
CSN: [id number]     Arrival date & time 04/12/15  [id number] History  By signing my name below, I, Emmanuella Mensah, attest that this documentation has been prepared under the direction and in the presence of Nat Christen, MD. Electronically Signed: Judithann Sauger, ED Scribe. 04/12/2015. 9:59 AM.     Chief Complaint  Patient presents with  . Hypotension   The history is provided by the patient. No language interpreter was used.   HPI Comments: Alan [name] is a 75 y.o. male with a hx of DM, ESRD, and HTN who presents to the Emergency Department complaining of gradually worsening non-radiating left lower quadrant pain onset last night. Pt explains that he was on his way to Dialysis this am when he had n/v/d and was not able to get dialyzed. He states that these symptoms are atypical for him. No alleviating factors noted PTA. Pt has received 4 mg of Zofran here. No fever or chills.    Past Medical History  Diagnosis Date  . Diabetes mellitus   . Hypertension   . Gout   . Coronary artery disease 2000    Stents [facility]  . Stroke ([place])   . Asthma   . Hypercholesteremia   . Arthritis   . DDD (degenerative disc disease), lumbar   . DDD (degenerative disc disease), cervical   . Gangrene of toe ([place]) Feb. 2015    Right  great    . Gastric ulcer     EGD 2014  . 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 Clotted renal dialysis arteriovenous graft ([place])   . CVA (cerebral infarction)   . ESRD (end stage renal disease) on dialysis [facility])    Past Surgical History  Procedure Laterality Date  . Coronary artery bypass graft  1996    [facility]  . Below knee leg amputation Bilateral   . Esophagogastroduodenoscopy Left 05/24/2012    [id number] dilated baggy but otherwise a normal/ Small hiatal hernia. Gastric ulcer -S/P biopsy  . Amputation Right 04/10/2013    Procedure: Right Below Knee Amputation;  Surgeon: Newt Minion, MD;  Location: [place];  Service: Orthopedics;  Laterality: Right;  Right  Below Knee Amputation  . Appendectomy    . Peripheral vascular catheterization N/A 08/09/2014    Procedure: A/V Shuntogram/Fistulagram;  Surgeon: Katha Cabal, MD;  Location: [place] CV [facility];  Service: Cardiovascular;  Laterality: N/A;  . Peripheral vascular catheterization Left 08/09/2014    Procedure: A/V Shunt Intervention;  Surgeon: Katha Cabal, MD;  Location: [place] [facility];  Service: Cardiovascular;  Laterality: Left;  . Cholecystectomy     Family History  Problem Relation Age of Onset  . Colon cancer Neg Hx   . Cancer Mother    Social History  Substance Use Topics  . Smoking status: Former Smoker    Quit date: 08/08/2004  . Smokeless tobacco: Never Used  . Alcohol Use: No    Review of Systems  Constitutional: Negative for fever and chills.  Gastrointestinal: Positive for nausea, vomiting, abdominal pain and diarrhea.    A complete 10 system review of systems was obtained and all systems are negative except as noted in the HPI and PMH.    Allergies  Codeine and Yellow jacket venom  Home Medications   Prior to Admission medications   Medication Sig Start Date End Date Taking? Authorizing Provider  acetaminophen (TYLENOL) 500 MG tablet Take 500 mg by mouth 2 (two) times daily.   Yes Historical  Provider, MD  Amino Acid Infusion (PROSOL) 20 % SOLN every Monday, Wednesday, and Friday.  10/27/14  Yes Historical Provider, MD  amLODipine (NORVASC) 5 MG tablet Take 1 tablet (5 mg total) by mouth daily. 12/22/14  Yes Lendon Colonel, NP  aspirin EC 81 MG tablet Take 81 mg by mouth at bedtime.    Yes Historical Provider, MD  atorvastatin (LIPITOR) 80 MG tablet Take 1 tablet (80 mg total) by mouth daily at 6 PM. 11/18/14  Yes Samuella Cota, MD  B Complex-C-Folic Acid (NEPHRO-VITE PO) Take 1 tablet by mouth every evening.    Yes Historical Provider, MD  colchicine 0.6 MG tablet Take 0.6 mg by mouth daily.   Yes Historical Provider, MD  furosemide (LASIX) 40  MG tablet Take 0.5 tablets (20 mg total) by mouth every evening. 12/22/14  Yes Lendon Colonel, NP  LANTUS SOLOSTAR 100 UNIT/ML Solostar Pen Inject 6 Units into the skin at bedtime. 12/21/13  Yes Historical Provider, MD  lisinopril (PRINIVIL,ZESTRIL) 5 MG tablet Take 1 tablet (5 mg total) by mouth daily. 12/22/14  Yes Lendon Colonel, NP  omeprazole (PRILOSEC) 20 MG capsule Take 1 capsule (20 mg total) by mouth 2 (two) times daily before a meal. 06/17/13  Yes Radene Gunning, NP  vitamin C (ASCORBIC ACID) 500 MG tablet Take 500 mg by mouth daily.   Yes Historical Provider, MD  lactose free nutrition (BOOST) LIQD Take 237 mLs by mouth daily. Reported on 04/12/2015    Historical Provider, MD  nitroGLYCERIN (NITROSTAT) 0.4 MG SL tablet Place 1 tablet (0.4 mg total) under the tongue every 5 (five) minutes as needed for chest pain. 11/18/14   Samuella Cota, MD  oxybutynin (DITROPAN) 5 MG tablet Take 1 tablet (5 mg total) by mouth 3 (three) times daily. Patient not taking: Reported on 04/12/2015 11/08/14   Daleen Bo, MD   BP 120/46 mmHg  Pulse 81  Temp(Src) 98.7 F (37.1 C) (Axillary)  Resp 20  Wt 174 lb (78.926 kg)  SpO2 98% Physical Exam  Constitutional: He is oriented to person, place, and time. He appears well-developed and well-nourished.  HENT:  Head: Normocephalic and atraumatic.  Eyes: Conjunctivae and EOM are normal. Pupils are equal, round, and reactive to light.  Neck: Normal range of motion. Neck supple.  Cardiovascular: Normal rate and regular rhythm.   Pulmonary/Chest: Effort normal and breath sounds normal.  Abdominal: Soft. Bowel sounds are normal. There is tenderness.  Tender in LLQ and slightly tender in left flank  Musculoskeletal: Normal range of motion.  Extremities: Bilateral BKA   Neurological: He is alert and oriented to person, place, and time.  Skin: Skin is warm and dry.  Psychiatric: He has a normal mood and affect. His behavior is normal.  Nursing note and  vitals reviewed.   ED Course  Procedures (including critical care time) DIAGNOSTIC STUDIES: Oxygen Saturation is 93% on RA, adequate by my interpretation.    COORDINATION OF CARE:  9:57 AM- Pt advised of plan for treatment and pt agrees. Pt Yonael receive lab work and CT scan for further evaluation. He [place] also receive pain medication.   Labs Review Labs Reviewed  CBC WITH DIFFERENTIAL/PLATELET - Abnormal; Notable for the following:    WBC 10.6 (*)    RBC 3.31 (*)    Hemoglobin 10.5 (*)    HCT 30.9 (*)    Neutro Abs 8.9 (*)    All other components within normal limits  COMPREHENSIVE METABOLIC  PANEL - Abnormal; Notable for the following:    Potassium 3.4 (*)    Chloride 98 (*)    Glucose, Bld 129 (*)    BUN 53 (*)    Creatinine, Ser 5.80 (*)    Calcium 7.6 (*)    Total Protein 6.2 (*)    Albumin 2.5 (*)    ALT 11 (*)    GFR calc non Af Amer 8 (*)    GFR calc Af Amer 10 (*)    Anion gap 16 (*)    All other components within normal limits  CBG MONITORING, ED - Abnormal; Notable for the following:    Glucose-Capillary 130 (*)    All other components within normal limits  LIPASE, BLOOD    Imaging Review Ct Abdomen Pelvis Wo Contrast  04/12/2015  CLINICAL DATA:  75 year old male presenting from dialysis with hypotension. Initial encounter. EXAM: CT ABDOMEN AND PELVIS WITHOUT CONTRAST TECHNIQUE: Multidetector CT imaging of the abdomen and pelvis was performed following the standard protocol without IV contrast. COMPARISON:  Noncontrast CT Abdomen and Pelvis 05/19/2012 FINDINGS: Scarring/atelectasis at the left lung base has not significantly changed since 2014. No pericardial or pleural effusion. Minimal scarring or atelectasis at the right lung base. The thoracic esophagus is mildly distended with air in contrast. Scoliosis and advanced degenerative changes in the spine. Chronic erosions of the right proximal femur and acetabulum. There is some associated joint effusion, but this  has a chronic appearance and has not significantly changed since 2014. Left hip joint degeneration. No acute osseous abnormality identified. Fluid in the rectum. Mild presacral stranding, nonspecific. No pelvic free fluid. Diminutive urinary bladder. Fluid as well as some gas and stool in the sigmoid colon. Fluid throughout the left colon. Redundant splenic flexure is decompressed in areas but otherwise contains stool. There is a mix of fluid in stool in the transverse colon. The hepatic flexure is dilated with fluid (up to 6 cm diameter). The ascending colon is mildly dilated with fluid. There is a small fat containing sub xiphoid ventral abdominal hernia (series 2, image 29) but no bowel loops are affected. However, there is a small (2-3 cm) immediately supraumbilical hernia which does contain a portion of the transverse colon which contains fluid. See series 2, image 46). There is oral contrast throughout most of the small bowel which is decompressed throughout. The stomach is distended with oral contrast and gas. Negative duodenum. No abdominal free air or free fluid. Surgically absent gallbladder. Negative non contrast liver, spleen, pancreas (fatty infiltrated), and adrenal glands. Negative noncontrast kidneys and ureters. Aortoiliac calcified atherosclerosis noted. No lymphadenopathy. There is a chronic partially calcified small fluid collection in the lower rectus abdominal sheath (series 2, image 55) which is unchanged. IMPRESSION: 1. Dominant finding is fluid throughout much of the colon, which can be seen with diarrhea or enema use. No free air or free fluid. 2. Associated intermittent dilatation of the colon, maximal at the hepatic flexure and up to 6 cm diameter. Favor inflammatory ileus. See # 3. 3. There is no small bowel dilatation to strongly indicate a mechanical bowel obstruction. And while there are 2 small ventral abdominal hernias, only that near the umbilicus (series 2, image 46) may have a  small incarcerated portion of the transverse colon but this of itself should not be obstructing. Recommend physical exam correlation to see if this small paraumbilical hernia is reducible. 4. No other acute process identified in the abdomen or pelvis. Additional chronic findings detailed above.  Electronically Signed   By: Genevie Ann M.D.   On: 04/12/2015 13:08     Nat Christen, MD has personally reviewed and evaluated these images and lab results as part of his medical decision-making.   EKG Interpretation   Date/Time:  Wednesday April 12 2015 09:37:13 EST Ventricular Rate:  65 PR Interval:  184 QRS Duration: 99 QT Interval:  446 QTC Calculation: 464 R Axis:   172 Text Interpretation:  Right and left arm electrode reversal,  interpretation assumes no reversal Sinus or ectopic atrial rhythm Probable  lateral infarct, age indeterminate Confirmed by Ignatz Deis  MD, Leena Tiede ([id number]) on  04/12/2015 12:38:32 PM     CRITICAL CARE Performed by: Nat Christen Total critical care time: 30 minutes Critical care time was exclusive of separately billable procedures and treating other patients. Critical care was necessary to treat or prevent imminent or life-threatening deterioration. Critical care was time spent personally by me on the following activities: development of treatment plan with patient and/or surrogate as well as nursing, discussions with consultants, evaluation of patient's response to treatment, examination of patient, obtaining history from patient or surrogate, ordering and performing treatments and interventions, ordering and review of laboratory studies, ordering and review of radiographic studies, pulse oximetry and re-evaluation of patient's condition. MDM   Final diagnoses:  ESRD (end stage renal disease) ([place])  Ileus ([place])   Patient with multiple health problems including end-stage renal disease, diabetes, hypertension, CAD, bilateral BKA, presents with copious diarrhea and some vomiting  while on his way to dialysis today. His blood pressure was initially very soft. It is improved with IV hydration. He continues to have multiple bowel movements. Traveion admit to observation.  I personally performed the services described in this documentation, which was scribed in my presence. The recorded information has been reviewed and is accurate.    Nat Christen, MD 04/12/15 1450

## 2015-04-12 NOTE — ED Notes (Signed)
Patient arrives via EMS from [facility] with c/o hypotension and weakness. Patient alert. 4 mg zofran given for nausea.

## 2015-04-13 DIAGNOSIS — E785 Hyperlipidemia, unspecified: Secondary | ICD-10-CM | POA: Diagnosis not present

## 2015-04-13 DIAGNOSIS — Z89512 Acquired absence of left leg below knee: Secondary | ICD-10-CM

## 2015-04-13 DIAGNOSIS — N186 End stage renal disease: Secondary | ICD-10-CM | POA: Diagnosis not present

## 2015-04-13 DIAGNOSIS — Z89511 Acquired absence of right leg below knee: Secondary | ICD-10-CM

## 2015-04-13 DIAGNOSIS — I10 Essential (primary) hypertension: Secondary | ICD-10-CM | POA: Diagnosis not present

## 2015-04-13 DIAGNOSIS — K529 Noninfective gastroenteritis and colitis, unspecified: Secondary | ICD-10-CM | POA: Diagnosis not present

## 2015-04-13 LAB — BASIC METABOLIC PANEL
ANION GAP: 11 (ref 5–15)
BUN: 70 mg/dL — ABNORMAL HIGH (ref 6–20)
CALCIUM: 7 mg/dL — AB (ref 8.9–10.3)
CHLORIDE: 101 mmol/L (ref 101–111)
CO2: 26 mmol/L (ref 22–32)
Creatinine, Ser: 6.52 mg/dL — ABNORMAL HIGH (ref 0.61–1.24)
GFR calc non Af Amer: 7 mL/min — ABNORMAL LOW (ref >60)
GFR, EST AFRICAN AMERICAN: 9 mL/min — AB (ref >60)
Glucose, Bld: 112 mg/dL — ABNORMAL HIGH (ref 65–99)
Potassium: 3.6 mmol/L (ref 3.5–5.1)
SODIUM: 138 mmol/L (ref 135–145)

## 2015-04-13 LAB — CBC
HCT: 28.2 % — ABNORMAL LOW (ref 39.0–52.0)
HEMOGLOBIN: 9.7 g/dL — AB (ref 13.0–17.0)
MCH: 32 pg (ref 26.0–34.0)
MCHC: 34.4 g/dL (ref 30.0–36.0)
MCV: 93.1 fL (ref 78.0–100.0)
Platelets: 204 10*3/uL (ref 150–400)
RBC: 3.03 MIL/uL — AB (ref 4.22–5.81)
RDW: 15 % (ref 11.5–15.5)
WBC: 12.1 10*3/uL — AB (ref 4.0–10.5)

## 2015-04-13 MED ORDER — SODIUM CHLORIDE 0.9 % IV SOLN: 100.0000 mL | INTRAVENOUS | Status: DC | PRN

## 2015-04-13 MED ORDER — LIDOCAINE-PRILOCAINE 2.5-2.5 % EX CREA: 1.0000 "application " | TOPICAL_CREAM | CUTANEOUS | Status: DC | PRN

## 2015-04-13 MED ORDER — LIDOCAINE HCL (PF) 1 % IJ SOLN: 5.0000 mL | INTRAMUSCULAR | Status: DC | PRN

## 2015-04-13 MED ORDER — PENTAFLUOROPROP-TETRAFLUOROETH EX AERO
1.0000 | INHALATION_SPRAY | CUTANEOUS | Status: DC | PRN
Start: 2015-04-13 — End: 2015-04-15

## 2015-04-13 MED ORDER — EPOETIN ALFA 10000 UNIT/ML IJ SOLN
INTRAMUSCULAR | Status: AC
  Administered 2015-04-13: 6000 [IU] via SUBCUTANEOUS
  Filled 2015-04-13: qty 1

## 2015-04-13 MED ORDER — EPOETIN ALFA 10000 UNIT/ML IJ SOLN
6000.0000 [IU] | INTRAMUSCULAR | Status: DC
  Administered 2015-04-13 – 2015-04-15 (×2): 6000 [IU] via SUBCUTANEOUS
  Filled 2015-04-13 (×2): qty 1

## 2015-04-13 NOTE — Care Management Obs Status (Signed)
[place] NOTIFICATION   Patient Details  Name: Demorio Murie MRN: [id number] Date of Birth: February 19, 1940   Medicare Observation Status Notification Given:  Yes    Alvie Heidelberg, RN 04/13/2015, 9:48 AM

## 2015-04-13 NOTE — Consult Note (Signed)
Reason for Consult: Incisional hernia Referring Physician: Hospitalist  Alan [name] is an 75 y.o. male.  HPI: Patient is a 75 year old black male multiple medical problems who presented to the emergency room after undergoing dialysis. He was noted to have abdominal pain and diarrhea. This has been going on for a few days. CT scan the abdomen revealed dilated small bowel loops as well as a large amount of air and stool in the colon. He was noted to have 2 ventral hernias, one at the umbilicus which contained a small amount of transverse colon. It was nonobstructing in nature. Patient was admitted to the hospital for further evaluation treatment. This morning, he denies any abdominal wall pain. He states he last had a loose stool several hours ago. He is not had any issues with the hernias in the past.  Past Medical History  Diagnosis Date  . Diabetes mellitus   . Hypertension   . Gout   . Coronary artery disease 2000    Stents [facility]  . Stroke ([place])   . Asthma   . Hypercholesteremia   . Arthritis   . DDD (degenerative disc disease), lumbar   . DDD (degenerative disc disease), cervical   . Gangrene of toe ([place]) Feb. 2015    Right  great    . Gastric ulcer     EGD 2014  . 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 Clotted renal dialysis arteriovenous graft ([place])   . CVA (cerebral infarction)   . ESRD (end stage renal disease) on dialysis [facility])     Past Surgical History  Procedure Laterality Date  . Coronary artery bypass graft  1996    [facility]  . Below knee leg amputation Bilateral   . Esophagogastroduodenoscopy Left 05/24/2012    [id number] dilated baggy but otherwise a normal/ Small hiatal hernia. Gastric ulcer -S/P biopsy  . Amputation Right 04/10/2013    Procedure: Right Below Knee Amputation;  Surgeon: Newt Minion, MD;  Location: [place];  Service: Orthopedics;  Laterality: Right;  Right Below Knee Amputation  . Appendectomy    . Peripheral vascular catheterization  N/A 08/09/2014    Procedure: A/V Shuntogram/Fistulagram;  Surgeon: Katha Cabal, MD;  Location: [place] [facility];  Service: Cardiovascular;  Laterality: N/A;  . Peripheral vascular catheterization Left 08/09/2014    Procedure: A/V Shunt Intervention;  Surgeon: Katha Cabal, MD;  Location: [place] [facility];  Service: Cardiovascular;  Laterality: Left;  . Cholecystectomy      Family History  Problem Relation Age of Onset  . Colon cancer Neg Hx   . Cancer Mother     Social History:  reports that he quit smoking about 10 years ago. He has never used smokeless tobacco. He reports that he does not drink alcohol or use illicit drugs.  Allergies:  Allergies  Allergen Reactions  . Codeine Itching and Nausea And Vomiting  . Yellow Jacket Venom [Bee Venom] Swelling    Medications: I have reviewed the patient's current medications.  Results for orders placed or performed during the hospital encounter of 04/12/15 (from the past 48 hour(s))  CBC with Differential     Status: Abnormal   Collection Time: 04/12/15 10:21 AM  Result Value Ref Range   WBC 10.6 (H) 4.0 - 10.5 K/uL   RBC 3.31 (L) 4.22 - 5.81 MIL/uL   Hemoglobin 10.5 (L) 13.0 - 17.0 g/dL   HCT 30.9 (L) 39.0 - 52.0 %   MCV 93.4 78.0 -  100.0 fL   MCH 31.7 26.0 - 34.0 pg   MCHC 34.0 30.0 - 36.0 g/dL   RDW 14.6 11.5 - 15.5 %   Platelets 210 150 - 400 K/uL   Neutrophils Relative % 83 %   Neutro Abs 8.9 (H) 1.7 - 7.7 K/uL   Lymphocytes Relative 13 %   Lymphs Abs 1.3 0.7 - 4.0 K/uL   Monocytes Relative 3 %   Monocytes Absolute 0.3 0.1 - 1.0 K/uL   Eosinophils Relative 1 %   Eosinophils Absolute 0.1 0.0 - 0.7 K/uL   Basophils Relative 0 %   Basophils Absolute 0.0 0.0 - 0.1 K/uL  Comprehensive metabolic panel     Status: Abnormal   Collection Time: 04/12/15 10:21 AM  Result Value Ref Range   Sodium 139 135 - 145 mmol/L   Potassium 3.4 (L) 3.5 - 5.1 mmol/L   Chloride 98 (L) 101 - 111 mmol/L   CO2 25 22 - 32 mmol/L    Glucose, Bld 129 (H) 65 - 99 mg/dL   BUN 53 (H) 6 - 20 mg/dL   Creatinine, Ser 5.80 (H) 0.61 - 1.24 mg/dL   Calcium 7.6 (L) 8.9 - 10.3 mg/dL   Total Protein 6.2 (L) 6.5 - 8.1 g/dL   Albumin 2.5 (L) 3.5 - 5.0 g/dL   AST 22 15 - 41 U/L   ALT 11 (L) 17 - 63 U/L   Alkaline Phosphatase 64 38 - 126 U/L   Total Bilirubin 1.0 0.3 - 1.2 mg/dL   GFR calc non Af Amer 8 (L) >60 mL/min   GFR calc Af Amer 10 (L) >60 mL/min    Comment: (NOTE) The eGFR has been calculated using the CKD EPI equation. This calculation has not been validated in all clinical situations. eGFR's persistently <60 mL/min signify possible Chronic Kidney Disease.    Anion gap 16 (H) 5 - 15  Lipase, blood     Status: None   Collection Time: 04/12/15 10:21 AM  Result Value Ref Range   Lipase 34 11 - 51 U/L  CBG monitoring, ED     Status: Abnormal   Collection Time: 04/12/15 12:19 PM  Result Value Ref Range   Glucose-Capillary 130 (H) 65 - 99 mg/dL  MRSA PCR Screening     Status: None   Collection Time: 04/12/15  8:00 PM  Result Value Ref Range   MRSA by PCR NEGATIVE NEGATIVE    Comment:        The GeneXpert MRSA Assay (FDA approved for NASAL specimens only), is one component of a comprehensive MRSA colonization surveillance program. It is not intended to diagnose MRSA infection nor to guide or monitor treatment for MRSA infections.   Basic metabolic panel     Status: Abnormal   Collection Time: 04/13/15  5:44 AM  Result Value Ref Range   Sodium 138 135 - 145 mmol/L   Potassium 3.6 3.5 - 5.1 mmol/L   Chloride 101 101 - 111 mmol/L   CO2 26 22 - 32 mmol/L   Glucose, Bld 112 (H) 65 - 99 mg/dL   BUN 70 (H) 6 - 20 mg/dL   Creatinine, Ser 6.52 (H) 0.61 - 1.24 mg/dL   Calcium 7.0 (L) 8.9 - 10.3 mg/dL   GFR calc non Af Amer 7 (L) >60 mL/min   GFR calc Af Amer 9 (L) >60 mL/min    Comment: (NOTE) The eGFR has been calculated using the CKD EPI equation. This calculation has not been validated in  all clinical  situations. eGFR's persistently <60 mL/min signify possible Chronic Kidney Disease.    Anion gap 11 5 - 15  CBC     Status: Abnormal   Collection Time: 04/13/15  5:44 AM  Result Value Ref Range   WBC 12.1 (H) 4.0 - 10.5 K/uL   RBC 3.03 (L) 4.22 - 5.81 MIL/uL   Hemoglobin 9.7 (L) 13.0 - 17.0 g/dL   HCT 28.2 (L) 39.0 - 52.0 %   MCV 93.1 78.0 - 100.0 fL   MCH 32.0 26.0 - 34.0 pg   MCHC 34.4 30.0 - 36.0 g/dL   RDW 15.0 11.5 - 15.5 %   Platelets 204 150 - 400 K/uL    Ct Abdomen Pelvis Wo Contrast  04/12/2015  CLINICAL DATA:  75 year old male presenting from dialysis with hypotension. Initial encounter. EXAM: CT ABDOMEN AND PELVIS WITHOUT CONTRAST TECHNIQUE: Multidetector CT imaging of the abdomen and pelvis was performed following the standard protocol without IV contrast. COMPARISON:  Noncontrast CT Abdomen and Pelvis 05/19/2012 FINDINGS: Scarring/atelectasis at the left lung base has not significantly changed since 2014. No pericardial or pleural effusion. Minimal scarring or atelectasis at the right lung base. The thoracic esophagus is mildly distended with air in contrast. Scoliosis and advanced degenerative changes in the spine. Chronic erosions of the right proximal femur and acetabulum. There is some associated joint effusion, but this has a chronic appearance and has not significantly changed since 2014. Left hip joint degeneration. No acute osseous abnormality identified. Fluid in the rectum. Mild presacral stranding, nonspecific. No pelvic free fluid. Diminutive urinary bladder. Fluid as well as some gas and stool in the sigmoid colon. Fluid throughout the left colon. Redundant splenic flexure is decompressed in areas but otherwise contains stool. There is a mix of fluid in stool in the transverse colon. The hepatic flexure is dilated with fluid (up to 6 cm diameter). The ascending colon is mildly dilated with fluid. There is a small fat containing sub xiphoid ventral abdominal hernia  (series 2, image 29) but no bowel loops are affected. However, there is a small (2-3 cm) immediately supraumbilical hernia which does contain a portion of the transverse colon which contains fluid. See series 2, image 46). There is oral contrast throughout most of the small bowel which is decompressed throughout. The stomach is distended with oral contrast and gas. Negative duodenum. No abdominal free air or free fluid. Surgically absent gallbladder. Negative non contrast liver, spleen, pancreas (fatty infiltrated), and adrenal glands. Negative noncontrast kidneys and ureters. Aortoiliac calcified atherosclerosis noted. No lymphadenopathy. There is a chronic partially calcified small fluid collection in the lower rectus abdominal sheath (series 2, image 55) which is unchanged. IMPRESSION: 1. Dominant finding is fluid throughout much of the colon, which can be seen with diarrhea or enema use. No free air or free fluid. 2. Associated intermittent dilatation of the colon, maximal at the hepatic flexure and up to 6 cm diameter. Favor inflammatory ileus. See # 3. 3. There is no small bowel dilatation to strongly indicate a mechanical bowel obstruction. And while there are 2 small ventral abdominal hernias, only that near the umbilicus (series 2, image 46) may have a small incarcerated portion of the transverse colon but this of itself should not be obstructing. Recommend physical exam correlation to see if this small paraumbilical hernia is reducible. 4. No other acute process identified in the abdomen or pelvis. Additional chronic findings detailed above. Electronically Signed   By: Herminio Heads.D.  On: 04/12/2015 13:08    ROS: See chart Blood pressure 109/61, pulse 77, temperature 98.5 F (36.9 C), temperature source Oral, resp. rate 16, height 5' 9"  (1.753 m), weight 73.2 kg (161 lb 6 oz), SpO2 94 %. Physical Exam: Pleasant black male in no acute distress. Abdomen is soft, nontender, nondistended. No rigidity is  noted. Multiple surgical scars present. No incarcerated hernias identified.  Assessment/Plan: Impression: Ventral hernias, asymptomatic.  CT scan shows a possible Richter's hernia. Plan: No need for surgical intervention at this time. Alan [name] follow peripherally with you.  Alan [name] A 04/13/2015, 8:04 AM

## 2015-04-13 NOTE — Progress Notes (Signed)
[facility] PROGRESS NOTE  Alan [name] [id number] DOB: October 04, 1940 DOA: 04/12/2015 PCP: PROVIDER NOT IN SYSTEM  Assessment/Plan: 1.  Nausea, vomiting and diarrhea. Likely gastroenteritis. Continue to treat supportively. Advance diet to solid food. He is not having any further diarrhea. 2.  Abdominal pain. Ventral hernia noted on CT. Appreciate general surgery input. No indication for surgical intervention at this time. 3.  Hypotension. Possibly related to volume depletion. He did have some low blood pressures today. Continue to hold antihypertensives. 4.  Hyperlipidemia. Continue statin. 5. End-stage renal disease on hemodialysis. Nephrology following. Patient underwent dialysis today.  Code Status: full code Family Communication: discussed with patient Disposition Plan: discharge home, likely in AM   Consultants:  Nephrology  Gen Surgery  Procedures:    Antibiotics:    HPI/Subjective: Vomiting and diarrhea have improved. Still has some abdominal soreness. Tolerating liquids  Objective: Filed Vitals:   04/13/15 1504 04/13/15 1520  BP: 117/54 130/56  Pulse: 74 73  Temp:  98.4 F (36.9 C)  Resp:  16    Intake/Output Summary (Last 24 hours) at 04/13/15 1838 Last data filed at 04/13/15 1828  Gross per 24 hour  Intake    840 ml  Output    296 ml  Net    544 ml   Filed Weights   04/12/15 1556 04/13/15 0527 04/13/15 1152  Weight: 73.4 kg (161 lb 13.1 oz) 73.2 kg (161 lb 6 oz) 73.1 kg (161 lb 2.5 oz)    Exam:   General:  NAD  Cardiovascular: S1, S2 RRR  Respiratory: CTA B  Abdomen: soft, mild tenderness in mid abdomen, bs+  Musculoskeletal: no edema b/l   Data Reviewed: Basic Metabolic Panel:  Recent Labs Lab 04/12/15 1021 04/13/15 0544  NA 139 138  K 3.4* 3.6  CL 98* 101  CO2 25 26  GLUCOSE 129* 112*  BUN 53* 70*  CREATININE 5.80* 6.52*  CALCIUM 7.6* 7.0*   Liver Function Tests:  Recent Labs Lab 04/12/15 1021  AST 22  ALT  11*  ALKPHOS 64  BILITOT 1.0  PROT 6.2*  ALBUMIN 2.5*    Recent Labs Lab 04/12/15 1021  LIPASE 34   No results for input(s): AMMONIA in the last 168 hours. CBC:  Recent Labs Lab 04/12/15 1021 04/13/15 0544  WBC 10.6* 12.1*  NEUTROABS 8.9*  --   HGB 10.5* 9.7*  HCT 30.9* 28.2*  MCV 93.4 93.1  PLT 210 204   Cardiac Enzymes: No results for input(s): CKTOTAL, CKMB, CKMBINDEX, TROPONINI in the last 168 hours. BNP (last 3 results) No results for input(s): BNP in the last 8760 hours.  ProBNP (last 3 results) No results for input(s): PROBNP in the last 8760 hours.  CBG:  Recent Labs Lab 04/12/15 1219  GLUCAP 130*    Recent Results (from the past 240 hour(s))  MRSA PCR Screening     Status: None   Collection Time: 04/12/15  8:00 PM  Result Value Ref Range Status   MRSA by PCR NEGATIVE NEGATIVE Final    Comment:        The GeneXpert MRSA Assay (FDA approved for NASAL specimens only), is one component of a comprehensive MRSA colonization surveillance program. It is not intended to diagnose MRSA infection nor to guide or monitor treatment for MRSA infections.      Studies: Ct Abdomen Pelvis Wo Contrast  04/12/2015  CLINICAL DATA:  75 year old male presenting from dialysis with hypotension. Initial encounter. EXAM: CT ABDOMEN AND PELVIS WITHOUT CONTRAST TECHNIQUE:  Multidetector CT imaging of the abdomen and pelvis was performed following the standard protocol without IV contrast. COMPARISON:  Noncontrast CT Abdomen and Pelvis 05/19/2012 FINDINGS: Scarring/atelectasis at the left lung base has not significantly changed since 2014. No pericardial or pleural effusion. Minimal scarring or atelectasis at the right lung base. The thoracic esophagus is mildly distended with air in contrast. Scoliosis and advanced degenerative changes in the spine. Chronic erosions of the right proximal femur and acetabulum. There is some associated joint effusion, but this has a chronic  appearance and has not significantly changed since 2014. Left hip joint degeneration. No acute osseous abnormality identified. Fluid in the rectum. Mild presacral stranding, nonspecific. No pelvic free fluid. Diminutive urinary bladder. Fluid as well as some gas and stool in the sigmoid colon. Fluid throughout the left colon. Redundant splenic flexure is decompressed in areas but otherwise contains stool. There is a mix of fluid in stool in the transverse colon. The hepatic flexure is dilated with fluid (up to 6 cm diameter). The ascending colon is mildly dilated with fluid. There is a small fat containing sub xiphoid ventral abdominal hernia (series 2, image 29) but no bowel loops are affected. However, there is a small (2-3 cm) immediately supraumbilical hernia which does contain a portion of the transverse colon which contains fluid. See series 2, image 46). There is oral contrast throughout most of the small bowel which is decompressed throughout. The stomach is distended with oral contrast and gas. Negative duodenum. No abdominal free air or free fluid. Surgically absent gallbladder. Negative non contrast liver, spleen, pancreas (fatty infiltrated), and adrenal glands. Negative noncontrast kidneys and ureters. Aortoiliac calcified atherosclerosis noted. No lymphadenopathy. There is a chronic partially calcified small fluid collection in the lower rectus abdominal sheath (series 2, image 55) which is unchanged. IMPRESSION: 1. Dominant finding is fluid throughout much of the colon, which can be seen with diarrhea or enema use. No free air or free fluid. 2. Associated intermittent dilatation of the colon, maximal at the hepatic flexure and up to 6 cm diameter. Favor inflammatory ileus. See # 3. 3. There is no small bowel dilatation to strongly indicate a mechanical bowel obstruction. And while there are 2 small ventral abdominal hernias, only that near the umbilicus (series 2, image 46) may have a small  incarcerated portion of the transverse colon but this of itself should not be obstructing. Recommend physical exam correlation to see if this small paraumbilical hernia is reducible. 4. No other acute process identified in the abdomen or pelvis. Additional chronic findings detailed above. Electronically Signed   By: Genevie Ann M.D.   On: 04/12/2015 13:08    Scheduled Meds: . aspirin EC  81 mg Oral QHS  . atorvastatin  80 mg Oral q1800  . epoetin (EPOGEN/PROCRIT) injection  6,000 Units Subcutaneous Q T,Th,Sa-HD  . heparin  5,000 Units Subcutaneous 3 times per day  . pantoprazole  40 mg Oral BID AC  . potassium chloride  40 mEq Oral Once  . sodium chloride flush  3 mL Intravenous Q12H  . sodium chloride flush  3 mL Intravenous Q12H   Continuous Infusions:   Active Problems:   HTN (hypertension), benign   Nausea with vomiting   ESRD (end stage renal disease) ([place])   Gastroenteritis   S/P bilateral BKA (below knee amputation) ([place])   HLD (hyperlipidemia)    Time spent: 45mins    Alan [name]  [facility] Pager [phone]. If 7PM-7AM, please contact night-coverage at [email or web address], password  [facility] 04/13/2015, 6:38 PM

## 2015-04-13 NOTE — Procedures (Signed)
   HEMODIALYSIS TREATMENT NOTE:  3 hour heparin-free dialysis completed via left forearm AVF (16g/antegrade).  HD tolerated; no fluid removal per MD order. All blood returned. Hemostasis achieved within 15 minutes. Report given to Janace Aris, RN.  Rockwell Alexandria, RN, CDN

## 2015-04-13 NOTE — Consult Note (Signed)
Reason for Consult: End-stage renal disease Referring Physician: Dr. Theadore Nan Matthews is an 75 y.o. male.  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 According to patient he was trying to go to dialysis unit however he started having abdominal pain with some diarrhea he has decided come to the hospital. Patient does state that he had about 6-7 watery diarrhea yesterday. Presently patient is still that he is feeling much better. He is still some abdominal pain but his diarrhea, nausea and vomiting has improved. Patient last dialysis was on Monday and he missed his dialysis yesterday.  Past Medical History  Diagnosis Date  . Diabetes mellitus   . Hypertension   . Gout   . Coronary artery disease 2000    Stents [facility]  . Stroke ([place])   . Asthma   . Hypercholesteremia   . Arthritis   . DDD (degenerative disc disease), lumbar   . DDD (degenerative disc disease), cervical   . Gangrene of toe ([place]) Feb. 2015    Right  great    . Gastric ulcer     EGD 2014  . 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 Clotted renal dialysis arteriovenous graft ([place])   . CVA (cerebral infarction)   . ESRD (end stage renal disease) on dialysis [facility])     Past Surgical History  Procedure Laterality Date  . Coronary artery bypass graft  1996    [facility]  . Below knee leg amputation Bilateral   . Esophagogastroduodenoscopy Left 05/24/2012    JFH:[id number] dilated baggy but otherwise a normal/ Small hiatal hernia. Gastric ulcer -S/P biopsy  . Amputation Right 04/10/2013    Procedure: Right Below Knee Amputation;  Surgeon: Newt Minion, MD;  Location: [place];  Service: Orthopedics;  Laterality: Right;  Right Below Knee Amputation  . Appendectomy    . Peripheral vascular catheterization N/A 08/09/2014    Procedure: A/V  Shuntogram/Fistulagram;  Surgeon: Katha Cabal, MD;  Location: [place] [facility];  Service: Cardiovascular;  Laterality: N/A;  . Peripheral vascular catheterization Left 08/09/2014    Procedure: A/V Shunt Intervention;  Surgeon: Katha Cabal, MD;  Location: [place] [facility];  Service: Cardiovascular;  Laterality: Left;  . Cholecystectomy      Family History  Problem Relation Age of Onset  . Colon cancer Neg Hx   . Cancer Mother     Social History:  reports that he quit smoking about 10 years ago. He has never used smokeless tobacco. He reports that he does not drink alcohol or use illicit drugs.  Allergies:  Allergies  Allergen Reactions  . Codeine Itching and Nausea And Vomiting  . Yellow [place]et Venom [Bee Venom] Swelling    Medications: I have reviewed the patient's current medications.  Results for orders placed or performed during the hospital encounter of 04/12/15 (from the past 48 hour(s))  CBC with Differential     Status: Abnormal   Collection Time: 04/12/15 10:21 AM  Result Value Ref Range   WBC 10.6 (H) 4.0 - 10.5 K/uL   RBC 3.31 (L) 4.22 - 5.81 MIL/uL   Hemoglobin 10.5 (L) 13.0 - 17.0 g/dL   HCT 30.9 (L) 39.0 - 52.0 %   MCV 93.4 78.0 - 100.0 fL   MCH 31.7 26.0 - 34.0 pg   MCHC 34.0 30.0 - 36.0 g/dL  RDW 14.6 11.5 - 15.5 %   Platelets 210 150 - 400 K/uL   Neutrophils Relative % 83 %   Neutro Abs 8.9 (H) 1.7 - 7.7 K/uL   Lymphocytes Relative 13 %   Lymphs Abs 1.3 0.7 - 4.0 K/uL   Monocytes Relative 3 %   Monocytes Absolute 0.3 0.1 - 1.0 K/uL   Eosinophils Relative 1 %   Eosinophils Absolute 0.1 0.0 - 0.7 K/uL   Basophils Relative 0 %   Basophils Absolute 0.0 0.0 - 0.1 K/uL  Comprehensive metabolic panel     Status: Abnormal   Collection Time: 04/12/15 10:21 AM  Result Value Ref Range   Sodium 139 135 - 145 mmol/L   Potassium 3.4 (L) 3.5 - 5.1 mmol/L   Chloride 98 (L) 101 - 111 mmol/L   CO2 25 22 - 32 mmol/L   Glucose, Bld 129 (H) 65 - 99  mg/dL   BUN 53 (H) 6 - 20 mg/dL   Creatinine, Ser 5.80 (H) 0.61 - 1.24 mg/dL   Calcium 7.6 (L) 8.9 - 10.3 mg/dL   Total Protein 6.2 (L) 6.5 - 8.1 g/dL   Albumin 2.5 (L) 3.5 - 5.0 g/dL   AST 22 15 - 41 U/L   ALT 11 (L) 17 - 63 U/L   Alkaline Phosphatase 64 38 - 126 U/L   Total Bilirubin 1.0 0.3 - 1.2 mg/dL   GFR calc non Af Amer 8 (L) >60 mL/min   GFR calc Af Amer 10 (L) >60 mL/min    Comment: (NOTE) The eGFR has been calculated using the CKD EPI equation. This calculation has not been validated in all clinical situations. eGFR's persistently <60 mL/min signify possible Chronic Kidney Disease.    Anion gap 16 (H) 5 - 15  Lipase, blood     Status: None   Collection Time: 04/12/15 10:21 AM  Result Value Ref Range   Lipase 34 11 - 51 U/L  CBG monitoring, ED     Status: Abnormal   Collection Time: 04/12/15 12:19 PM  Result Value Ref Range   Glucose-Capillary 130 (H) 65 - 99 mg/dL  MRSA PCR Screening     Status: None   Collection Time: 04/12/15  8:00 PM  Result Value Ref Range   MRSA by PCR NEGATIVE NEGATIVE    Comment:        The GeneXpert MRSA Assay (FDA approved for NASAL specimens only), is one component of a comprehensive MRSA colonization surveillance program. It is not intended to diagnose MRSA infection nor to guide or monitor treatment for MRSA infections.   Basic metabolic panel     Status: Abnormal   Collection Time: 04/13/15  5:44 AM  Result Value Ref Range   Sodium 138 135 - 145 mmol/L   Potassium 3.6 3.5 - 5.1 mmol/L   Chloride 101 101 - 111 mmol/L   CO2 26 22 - 32 mmol/L   Glucose, Bld 112 (H) 65 - 99 mg/dL   BUN 70 (H) 6 - 20 mg/dL   Creatinine, Ser 6.52 (H) 0.61 - 1.24 mg/dL   Calcium 7.0 (L) 8.9 - 10.3 mg/dL   GFR calc non Af Amer 7 (L) >60 mL/min   GFR calc Af Amer 9 (L) >60 mL/min    Comment: (NOTE) The eGFR has been calculated using the CKD EPI equation. This calculation has not been validated in all clinical situations. eGFR's persistently <60  mL/min signify possible Chronic Kidney Disease.    Anion gap 11 5 -  15  CBC     Status: Abnormal   Collection Time: 04/13/15  5:44 AM  Result Value Ref Range   WBC 12.1 (H) 4.0 - 10.5 K/uL   RBC 3.03 (L) 4.22 - 5.81 MIL/uL   Hemoglobin 9.7 (L) 13.0 - 17.0 g/dL   HCT 28.2 (L) 39.0 - 52.0 %   MCV 93.1 78.0 - 100.0 fL   MCH 32.0 26.0 - 34.0 pg   MCHC 34.4 30.0 - 36.0 g/dL   RDW 15.0 11.5 - 15.5 %   Platelets 204 150 - 400 K/uL    Ct Abdomen Pelvis Wo Contrast  04/12/2015  CLINICAL DATA:  75 year old male presenting from dialysis with hypotension. Initial encounter. EXAM: CT ABDOMEN AND PELVIS WITHOUT CONTRAST TECHNIQUE: Multidetector CT imaging of the abdomen and pelvis was performed following the standard protocol without IV contrast. COMPARISON:  Noncontrast CT Abdomen and Pelvis 05/19/2012 FINDINGS: Scarring/atelectasis at the left lung base has not significantly changed since 2014. No pericardial or pleural effusion. Minimal scarring or atelectasis at the right lung base. The thoracic esophagus is mildly distended with air in contrast. Scoliosis and advanced degenerative changes in the spine. Chronic erosions of the right proximal femur and acetabulum. There is some associated joint effusion, but this has a chronic appearance and has not significantly changed since 2014. Left hip joint degeneration. No acute osseous abnormality identified. Fluid in the rectum. Mild presacral stranding, nonspecific. No pelvic free fluid. Diminutive urinary bladder. Fluid as well as some gas and stool in the sigmoid colon. Fluid throughout the left colon. Redundant splenic flexure is decompressed in areas but otherwise contains stool. There is a mix of fluid in stool in the transverse colon. The hepatic flexure is dilated with fluid (up to 6 cm diameter). The ascending colon is mildly dilated with fluid. There is a small fat containing sub xiphoid ventral abdominal hernia (series 2, image 29) but no bowel loops are  affected. However, there is a small (2-3 cm) immediately supraumbilical hernia which does contain a portion of the transverse colon which contains fluid. See series 2, image 46). There is oral contrast throughout most of the small bowel which is decompressed throughout. The stomach is distended with oral contrast and gas. Negative duodenum. No abdominal free air or free fluid. Surgically absent gallbladder. Negative non contrast liver, spleen, pancreas (fatty infiltrated), and adrenal glands. Negative noncontrast kidneys and ureters. Aortoiliac calcified atherosclerosis noted. No lymphadenopathy. There is a chronic partially calcified small fluid collection in the lower rectus abdominal sheath (series 2, image 55) which is unchanged. IMPRESSION: 1. Dominant finding is fluid throughout much of the colon, which can be seen with diarrhea or enema use. No free air or free fluid. 2. Associated intermittent dilatation of the colon, maximal at the hepatic flexure and up to 6 cm diameter. Favor inflammatory ileus. See # 3. 3. There is no small bowel dilatation to strongly indicate a mechanical bowel obstruction. And while there are 2 small ventral abdominal hernias, only that near the umbilicus (series 2, image 46) may have a small incarcerated portion of the transverse colon but this of itself should not be obstructing. Recommend physical exam correlation to see if this small paraumbilical hernia is reducible. 4. No other acute process identified in the abdomen or pelvis. Additional chronic findings detailed above. Electronically Signed   By: Genevie Ann M.D.   On: 04/12/2015 13:08    Review of Systems  Constitutional: Negative for fever and chills.  Respiratory: Negative for cough  and shortness of breath.   Cardiovascular: Negative for chest pain and orthopnea.  Gastrointestinal: Positive for nausea, vomiting, abdominal pain and diarrhea.   Blood pressure 109/61, pulse 77, temperature 98.5 F (36.9 C), temperature  source Oral, resp. rate 16, height 5' 9"  (1.753 m), weight 161 lb 6 oz (73.2 kg), SpO2 94 %. Physical Exam  Constitutional: He is oriented to person, place, and time. No distress.  Eyes: Left eye exhibits no discharge.  Neck: No JVD present.  Cardiovascular: Normal rate and regular rhythm.   GI: Bowel sounds are normal. He exhibits no distension. There is tenderness. There is guarding.  Musculoskeletal: He exhibits no edema.  Bilateral BKA  Neurological: He is alert and oriented to person, place, and time.    Assessment/Plan: Problem #1 gastroenteritis. Patient came with diarrhea, nausea and vomiting. Patient presently is a febrile but his white blood cell count is high. Stool for C. difficile is pending. Problem #2 end-stage renal disease: He is status post hemodialysis on Monday. Patient with his dialysis yesterday. His potassium today is normal Problem #3 history of coronary artery disease: Presently he doesn't have any chest pain. Patient however with chronic angina. Problem #4 history of hypertension: Recently patient with low normal blood pressure. He is not on any antihypertensive medications. Problem #5 history of CVA Problem #6 anemia: His hemoglobin is within our target goal. Problem #7 hypocalcemia: Most likely secondary to low albumin as patient has severe malnutrition. Plan: 1] We'll make arrangements for patient to get dialysis today 2] We'll dialyze him for 3 hours and we are not going to remove any fluid 3] We'll use 4K/2.5 calcium bath as patient has low potassium when he came. 4] We'll check his basic metabolic panel, phosphorus and CBC 5] We'll use Epogen 6000 units IV after each dialysis  [facility] S 04/13/2015, 10:25 AM

## 2015-04-13 NOTE — Care Management Note (Signed)
Case Management Note  Patient Details  Name: Alan [name] MRN: [id number] Date of Birth: 31-Jan-1941  Subjective/Objective:        Spoke with patient who is form home with spouse and alert answers questions appropriately. Patient stated that he attends dialysis MWF at [facility] on [address].    State that he has transportation through the "[place]".   Patient uses a wheelchair at home due to bilateral amputations. No CM needs identified at this visit [name] continue to follow for new needs      Action/Plan: Expect home with self care.  Expected Discharge Date:  04/14/15               Expected Discharge Plan:  Home/Self Care  In-House Referral:     Discharge planning Services  CM Consult  Post Acute Care Choice:    Choice offered to:     DME Arranged:    DME Agency:     HH Arranged:    HH Agency:     Status of Service:  Completed, signed off  Medicare Important Message Given:    Date Medicare IM Given:    Medicare IM give by:    Date Additional Medicare IM Given:    Additional Medicare Important Message give by:     If discussed at [place] of Stay Meetings, dates discussed:    Additional Comments:  Alvie Heidelberg, RN 04/13/2015, 9:02 AM

## 2015-04-14 DIAGNOSIS — K529 Noninfective gastroenteritis and colitis, unspecified: Secondary | ICD-10-CM | POA: Diagnosis not present

## 2015-04-14 DIAGNOSIS — E785 Hyperlipidemia, unspecified: Secondary | ICD-10-CM | POA: Diagnosis not present

## 2015-04-14 DIAGNOSIS — N186 End stage renal disease: Secondary | ICD-10-CM | POA: Diagnosis not present

## 2015-04-14 DIAGNOSIS — I10 Essential (primary) hypertension: Secondary | ICD-10-CM | POA: Diagnosis not present

## 2015-04-14 LAB — BASIC METABOLIC PANEL
Anion gap: 9 (ref 5–15)
BUN: 38 mg/dL — AB (ref 6–20)
CALCIUM: 7.3 mg/dL — AB (ref 8.9–10.3)
CO2: 28 mmol/L (ref 22–32)
CREATININE: 4.48 mg/dL — AB (ref 0.61–1.24)
Chloride: 98 mmol/L — ABNORMAL LOW (ref 101–111)
GFR calc non Af Amer: 12 mL/min — ABNORMAL LOW (ref >60)
GFR, EST AFRICAN AMERICAN: 13 mL/min — AB (ref >60)
Glucose, Bld: 134 mg/dL — ABNORMAL HIGH (ref 65–99)
Potassium: 3.7 mmol/L (ref 3.5–5.1)
SODIUM: 135 mmol/L (ref 135–145)

## 2015-04-14 LAB — C DIFFICILE QUICK SCREEN W PCR REFLEX
C DIFFICILE (CDIFF) INTERP: NEGATIVE
C DIFFICILE (CDIFF) TOXIN: NEGATIVE
C DIFFICLE (CDIFF) ANTIGEN: NEGATIVE

## 2015-04-14 LAB — CBC
HCT: 27.3 % — ABNORMAL LOW (ref 39.0–52.0)
Hemoglobin: 9.1 g/dL — ABNORMAL LOW (ref 13.0–17.0)
MCH: 31.4 pg (ref 26.0–34.0)
MCHC: 33.3 g/dL (ref 30.0–36.0)
MCV: 94.1 fL (ref 78.0–100.0)
PLATELETS: 183 10*3/uL (ref 150–400)
RBC: 2.9 MIL/uL — AB (ref 4.22–5.81)
RDW: 14.9 % (ref 11.5–15.5)
WBC: 11.8 10*3/uL — AB (ref 4.0–10.5)

## 2015-04-14 LAB — PHOSPHORUS: PHOSPHORUS: 3.2 mg/dL (ref 2.5–4.6)

## 2015-04-14 MED ORDER — LOPERAMIDE HCL 2 MG PO CAPS
2.0000 mg | ORAL_CAPSULE | Freq: Three times a day (TID) | ORAL | Status: DC
  Administered 2015-04-14 (×3): 2 mg via ORAL
  Filled 2015-04-14 (×3): qty 1

## 2015-04-14 NOTE — Progress Notes (Signed)
Subjective: Patient denies any nausea or vomiting. Patient however still has some diarrhea about 2 times yesterday. Patient describes diarrhea to be watery and no blood. Presently C. difficile is negative. His abdominal pain has improved   Objective: Vital signs in last 24 hours: Temp:  [98.4 F (36.9 C)-99.6 F (37.6 C)] 98.7 F (37.1 C) (02/24 [id number]) Pulse Rate:  [72-81] 80 (02/24 0633) Resp:  [16-18] 18 (02/24 0633) BP: (87-140)/(42-69) 119/45 mmHg (02/24 0633) SpO2:  [95 %-99 %] 96 % (02/24 [id number]) Weight:  [161 lb 2.5 oz (73.1 kg)] 161 lb 2.5 oz (73.1 kg) (02/23 1152)  Intake/Output from previous day: 02/23 0701 - 02/24 0700 In: 840 [P.O.:840] Out: 196 [Urine:200] Intake/Output this shift:     Recent Labs  04/12/15 1021 04/13/15 0544 04/14/15 0608  HGB 10.5* 9.7* 9.1*    Recent Labs  04/13/15 0544 04/14/15 0608  WBC 12.1* 11.8*  RBC 3.03* 2.90*  HCT 28.2* 27.3*  PLT 204 183    Recent Labs  04/13/15 0544 04/14/15 0608  NA 138 135  K 3.6 3.7  CL 101 98*  CO2 26 28  BUN 70* 38*  CREATININE 6.52* 4.48*  GLUCOSE 112* 134*  CALCIUM 7.0* 7.3*   No results for input(s): LABPT, INR in the last 72 hours.  Generally patient is alert in no apparent distress Chest is clear to auscultation Heart exam revealed regular rate and rhythm no murmurs Extremities no edema  Assessment/Plan: Problem #1 gastroenteritis: Seems to be improving. Patient is still with some diarrhea but much better. Etiology is this moment is not clear. Problem #2 end-stage renal disease: He is status post hemodialysis yesterday. Patient is asymptomatic and his potassium is normal Problem #3 diabetes Problem #4 history of hypertension: His blood pressure is borderline normal Problem #5 coronary artery disease Problem #6. Anemia: His hemoglobin is below our target goal. Presently he is on Epogen on dialysis. Problem #7 metabolic bone disease: His calcium is slightly low but corrected for  albumin is within normal range. His potassium is normal and patient is not on any binder. Plan: Patient presently doesn't require dialysis 2] his next dialysis [facility] be tomorrow.    Haeli Gerlich S 04/14/2015, 8:19 AM

## 2015-04-14 NOTE — Progress Notes (Signed)
[facility] PROGRESS NOTE  Alan [name] [id number] DOB: 01-30-1941 DOA: 04/12/2015 PCP: PROVIDER NOT IN SYSTEM  Assessment/Plan: 1.  Nausea, vomiting and diarrhea. Likely viral gastroenteritis. Continue to treat supportively. Advance diet to solid food. He reports 4 loose bowel movements since this morning. PO intake has been poor since reoccurrence of nausea. Treyvonne provide Imodium and antiemetics.  2.  Abdominal pain. Ventral hernia noted on CT. Appreciate general surgery input. No indication for surgical intervention at this time. 3.  Hypotension. Possibly related to volume depletion. BP are currently normotensive. Continue to hold antihypertensives. 4.  Hyperlipidemia. Continue statin. 5. End-stage renal disease on hemodialysis. Nephrology following. Patient underwent dialysis yesterday.   Code Status: full code DVT prophylaxis: SCDs Family Communication: discussed with patient Disposition Plan: discharge home, hopefully next 24 hours.    Consultants:  Nephrology  Gen Surgery  Procedures:    Antibiotics:    HPI/Subjective: Diarrhea has returned X4 bowel movements.Was unable to eat due to nausea.  Objective: Filed Vitals:   04/13/15 2152 04/14/15 0633  BP: 100/42 119/45  Pulse: 78 80  Temp: 99.6 F (37.6 C) 98.7 F (37.1 C)  Resp: 18 18    Intake/Output Summary (Last 24 hours) at 04/14/15 1042 Last data filed at 04/14/15 0954  Gross per 24 hour  Intake    483 ml  Output    196 ml  Net    287 ml   Filed Weights   04/12/15 1556 04/13/15 0527 04/13/15 1152  Weight: 73.4 kg (161 lb 13.1 oz) 73.2 kg (161 lb 6 oz) 73.1 kg (161 lb 2.5 oz)    Exam:   General:  NAD  Cardiovascular: S1, S2 RRR  Respiratory: CTAB  Abdomen: soft, mild tenderness in mid abdomen, bs+  Musculoskeletal: no edema b/l   Data Reviewed: Basic Metabolic Panel:  Recent Labs Lab 04/12/15 1021 04/13/15 0544 04/14/15 0608  NA 139 138 135  K 3.4* 3.6 3.7  CL 98* 101  98*  CO2 25 26 28   GLUCOSE 129* 112* 134*  BUN 53* 70* 38*  CREATININE 5.80* 6.52* 4.48*  CALCIUM 7.6* 7.0* 7.3*  PHOS  --   --  3.2   Liver Function Tests:  Recent Labs Lab 04/12/15 1021  AST 22  ALT 11*  ALKPHOS 64  BILITOT 1.0  PROT 6.2*  ALBUMIN 2.5*    Recent Labs Lab 04/12/15 1021  LIPASE 34   No results for input(s): AMMONIA in the last 168 hours. CBC:  Recent Labs Lab 04/12/15 1021 04/13/15 0544 04/14/15 0608  WBC 10.6* 12.1* 11.8*  NEUTROABS 8.9*  --   --   HGB 10.5* 9.7* 9.1*  HCT 30.9* 28.2* 27.3*  MCV 93.4 93.1 94.1  PLT 210 204 183   Cardiac Enzymes: No results for input(s): CKTOTAL, CKMB, CKMBINDEX, TROPONINI in the last 168 hours. BNP (last 3 results) No results for input(s): BNP in the last 8760 hours.  ProBNP (last 3 results) No results for input(s): PROBNP in the last 8760 hours.  CBG:  Recent Labs Lab 04/12/15 1219  GLUCAP 130*    Recent Results (from the past 240 hour(s))  MRSA PCR Screening     Status: None   Collection Time: 04/12/15  8:00 PM  Result Value Ref Range Status   MRSA by PCR NEGATIVE NEGATIVE Final    Comment:        The GeneXpert MRSA Assay (FDA approved for NASAL specimens only), is one component of a comprehensive MRSA colonization surveillance program.  It is not intended to diagnose MRSA infection nor to guide or monitor treatment for MRSA infections.   C difficile quick scan w PCR reflex     Status: None   Collection Time: 04/13/15 11:49 PM  Result Value Ref Range Status   C Diff antigen NEGATIVE NEGATIVE Final   C Diff toxin NEGATIVE NEGATIVE Final   C Diff interpretation Negative for toxigenic C. difficile  Final     Studies: Ct Abdomen Pelvis Wo Contrast  04/12/2015  CLINICAL DATA:  75 year old male presenting from dialysis with hypotension. Initial encounter. EXAM: CT ABDOMEN AND PELVIS WITHOUT CONTRAST TECHNIQUE: Multidetector CT imaging of the abdomen and pelvis was performed following the  standard protocol without IV contrast. COMPARISON:  Noncontrast CT Abdomen and Pelvis 05/19/2012 FINDINGS: Scarring/atelectasis at the left lung base has not significantly changed since 2014. No pericardial or pleural effusion. Minimal scarring or atelectasis at the right lung base. The thoracic esophagus is mildly distended with air in contrast. Scoliosis and advanced degenerative changes in the spine. Chronic erosions of the right proximal femur and acetabulum. There is some associated joint effusion, but this has a chronic appearance and has not significantly changed since 2014. Left hip joint degeneration. No acute osseous abnormality identified. Fluid in the rectum. Mild presacral stranding, nonspecific. No pelvic free fluid. Diminutive urinary bladder. Fluid as well as some gas and stool in the sigmoid colon. Fluid throughout the left colon. Redundant splenic flexure is decompressed in areas but otherwise contains stool. There is a mix of fluid in stool in the transverse colon. The hepatic flexure is dilated with fluid (up to 6 cm diameter). The ascending colon is mildly dilated with fluid. There is a small fat containing sub xiphoid ventral abdominal hernia (series 2, image 29) but no bowel loops are affected. However, there is a small (2-3 cm) immediately supraumbilical hernia which does contain a portion of the transverse colon which contains fluid. See series 2, image 46). There is oral contrast throughout most of the small bowel which is decompressed throughout. The stomach is distended with oral contrast and gas. Negative duodenum. No abdominal free air or free fluid. Surgically absent gallbladder. Negative non contrast liver, spleen, pancreas (fatty infiltrated), and adrenal glands. Negative noncontrast kidneys and ureters. Aortoiliac calcified atherosclerosis noted. No lymphadenopathy. There is a chronic partially calcified small fluid collection in the lower rectus abdominal sheath (series 2, image  55) which is unchanged. IMPRESSION: 1. Dominant finding is fluid throughout much of the colon, which can be seen with diarrhea or enema use. No free air or free fluid. 2. Associated intermittent dilatation of the colon, maximal at the hepatic flexure and up to 6 cm diameter. Favor inflammatory ileus. See # 3. 3. There is no small bowel dilatation to strongly indicate a mechanical bowel obstruction. And while there are 2 small ventral abdominal hernias, only that near the umbilicus (series 2, image 46) may have a small incarcerated portion of the transverse colon but this of itself should not be obstructing. Recommend physical exam correlation to see if this small paraumbilical hernia is reducible. 4. No other acute process identified in the abdomen or pelvis. Additional chronic findings detailed above. Electronically Signed   By: Genevie Ann M.D.   On: 04/12/2015 13:08    Scheduled Meds: . aspirin EC  81 mg Oral QHS  . atorvastatin  80 mg Oral q1800  . epoetin (EPOGEN/PROCRIT) injection  6,000 Units Subcutaneous Q T,Th,Sa-HD  . heparin  5,000 Units Subcutaneous 3  times per day  . pantoprazole  40 mg Oral BID AC  . potassium chloride  40 mEq Oral Once  . sodium chloride flush  3 mL Intravenous Q12H  . sodium chloride flush  3 mL Intravenous Q12H   Continuous Infusions:   Active Problems:   HTN (hypertension), benign   Nausea with vomiting   ESRD (end stage renal disease) ([place])   Gastroenteritis   S/P bilateral BKA (below knee amputation) ([place])   HLD (hyperlipidemia)    Time spent: 28mins    Jessica Augustus  [facility] Pager [phone]. If 7PM-7AM, please contact night-coverage at [email or web address], password [facility] 04/14/2015, 10:42 AM       I, Dr. Kathie Dike, personally performed the services described in this documentaiton. All medical record entries made by the scribe were at my direction and in my presence. I have reviewed the chart and agree that the record reflects my  personal performance and is accurate and complete  Kathie Dike, MD, 04/14/2015 10:49 AM

## 2015-04-15 DIAGNOSIS — E785 Hyperlipidemia, unspecified: Secondary | ICD-10-CM | POA: Diagnosis not present

## 2015-04-15 DIAGNOSIS — I10 Essential (primary) hypertension: Secondary | ICD-10-CM | POA: Diagnosis not present

## 2015-04-15 DIAGNOSIS — K529 Noninfective gastroenteritis and colitis, unspecified: Secondary | ICD-10-CM | POA: Diagnosis not present

## 2015-04-15 DIAGNOSIS — N186 End stage renal disease: Secondary | ICD-10-CM | POA: Diagnosis not present

## 2015-04-15 LAB — CBC
HEMATOCRIT: 26.2 % — AB (ref 39.0–52.0)
HEMOGLOBIN: 8.8 g/dL — AB (ref 13.0–17.0)
MCH: 31.5 pg (ref 26.0–34.0)
MCHC: 33.6 g/dL (ref 30.0–36.0)
MCV: 93.9 fL (ref 78.0–100.0)
Platelets: 187 10*3/uL (ref 150–400)
RBC: 2.79 MIL/uL — AB (ref 4.22–5.81)
RDW: 14.6 % (ref 11.5–15.5)
WBC: 8.6 10*3/uL (ref 4.0–10.5)

## 2015-04-15 LAB — BASIC METABOLIC PANEL
ANION GAP: 9 (ref 5–15)
BUN: 46 mg/dL — ABNORMAL HIGH (ref 6–20)
CHLORIDE: 98 mmol/L — AB (ref 101–111)
CO2: 27 mmol/L (ref 22–32)
Calcium: 7.4 mg/dL — ABNORMAL LOW (ref 8.9–10.3)
Creatinine, Ser: 5.31 mg/dL — ABNORMAL HIGH (ref 0.61–1.24)
GFR calc non Af Amer: 9 mL/min — ABNORMAL LOW (ref >60)
GFR, EST AFRICAN AMERICAN: 11 mL/min — AB (ref >60)
GLUCOSE: 164 mg/dL — AB (ref 65–99)
Potassium: 3.7 mmol/L (ref 3.5–5.1)
Sodium: 134 mmol/L — ABNORMAL LOW (ref 135–145)

## 2015-04-15 MED ORDER — LOPERAMIDE HCL 2 MG PO CAPS
2.0000 mg | ORAL_CAPSULE | Freq: Four times a day (QID) | ORAL | Status: DC
  Administered 2015-04-15 (×2): 2 mg via ORAL
  Filled 2015-04-15 (×2): qty 1

## 2015-04-15 MED ORDER — LOPERAMIDE HCL 2 MG PO CAPS: 2.0000 mg | ORAL_CAPSULE | Freq: Four times a day (QID) | ORAL | Status: DC | PRN

## 2015-04-15 MED ORDER — EPOETIN ALFA 10000 UNIT/ML IJ SOLN
INTRAMUSCULAR | Status: AC
  Administered 2015-04-15: 6000 [IU] via SUBCUTANEOUS
  Filled 2015-04-15: qty 1

## 2015-04-15 MED ORDER — SODIUM CHLORIDE 0.9 % IV SOLN: 100.0000 mL | INTRAVENOUS | Status: DC | PRN

## 2015-04-15 NOTE — Procedures (Signed)
   HEMODIALYSIS TREATMENT NOTE:  3.5 hour heparin-free dialysis completed via left forearm AVF (16g/antegrade). Goal met: 1 liter removed without interruption in ultrafiltration. All blood was returned and hemostasis was achieved within 15 minutes. Report called to Russ Halo, RN.  [place], RN, CDN

## 2015-04-15 NOTE — Discharge Summary (Signed)
Physician Discharge Summary  Alan [name] [id number] DOB: 1940/05/07 DOA: 04/12/2015  PCP: PROVIDER NOT IN SYSTEM  Admit date: 04/12/2015 Discharge date: 04/15/2015  Time spent: 46minutes  Recommendations for Outpatient Follow-up:  1. Follow up with dialysis center on Monday as previously scheduled   Discharge Diagnoses:  Active Problems:   HTN (hypertension), benign   Nausea with vomiting   ESRD (end stage renal disease) ([facility])   Gastroenteritis   S/P bilateral BKA (below knee amputation) ([facility])   HLD (hyperlipidemia) Insulin dependent Diabetes Mellitus  Discharge Condition: improved  Diet recommendation: low salt  Filed Weights   04/15/15 0550 04/15/15 1038 04/15/15 1430  Weight: 73.6 kg (162 lb 4.1 oz) 73.8 kg (162 lb 11.2 oz) 72.6 kg (160 lb 0.9 oz)    History of present illness:  Alan [name] is a 75 y.o. male was a history of end-stage renal disease on hemodialysis, was traveling to hemodialysis on the morning of admission, when he developed onset of peri umbilical abdominal pain. He reported this as a sharp pain. He had associated vomiting and reported throwing up several times that day. He reported 6-7 loose stools on the day of admission. Denied any fever. All his symptoms started on the day of admission, he was in his usual state of health the day before. Denied any sick contacts. He had not eaten in any restaurants lately. He had not had any chest pain, shortness of breath. He presented to the dialysis center and was immediately referred to the emergency room for evaluation. While in the ED, he was noted to be hypotensive on arrival. He received gentle hydration with improvement of his blood pressure. CT of the abdomen and pelvis showed possible inflammatory ileus, no small bowel dilatation to strongly indicate mechanical bowel obstruction. 2 small ventral abdominal hernias were noted. One hernia was felt to have a possible small incarcerated portion of the transverse colon,  but this in of itself should not be obstructing. The patient was admitted for further treatments.  Hospital Course:  Patient was admitted for nausea, vomiting, diarrhea secondary to possible gastroenteritis. He was provided supportive treatment and started on clear liquids with improvement in symptoms. Stool for negative for C. Diff. He was started on imodium with improvement of diarrhea and is no longer having any vomiting. On discharge he was able to tolerate solid diet and had no complaints of further pain, vomiting, or diarrhea.  1. Abdominal pain. Possibly related to above CT scan revealed ventral hernia.General surgery evaluated and did not feel surgical intervention was needed at this time. Patient did not appear septic or toxic. 2. Hypotension. Likely related to volume depletion. While in the ED provided gentle hydration with improvement noted in BP. Antihypertensives were held, during admission and he is now normotensive. Horst restart on lisinopril. Warnie continue to hold amlodipine on discharge. This may be resumed as an outpatient as his blood pressure allows. 3. Hyperlipidemia. Continue statin 4. End-stage renal disease on hemodialysis. Patient normally receives dialysis on Monday, Wednesday and Fridays. He was not dialyzed Wednesday due to his GI issues. Nephrology consulted and he was provided HD 2/23 and 2/25  Procedures:    Consultations:  Nephrology  Gen Surgery  Discharge Exam: Filed Vitals:   04/15/15 1415 04/15/15 1430  BP: 122/64 128/60  Pulse: 75 73  Temp:  97.9 F (36.6 C)  Resp:  16    General: NAD Cardiovascular: s1, s2, rrr Respiratory: CTA B  Discharge Instructions   Discharge Instructions  Diet - low sodium heart healthy    Complete by:  As directed      Increase activity slowly    Complete by:  As directed           Current Discharge Medication List    START taking these medications   Details  loperamide (IMODIUM) 2 MG capsule Take 1  capsule (2 mg total) by mouth 4 (four) times daily as needed for diarrhea or loose stools. Qty: 30 capsule, Refills: 0      CONTINUE these medications which have NOT CHANGED   Details  acetaminophen (TYLENOL) 500 MG tablet Take 500 mg by mouth 2 (two) times daily.    Amino Acid Infusion (PROSOL) 20 % SOLN every Monday, Wednesday, and Friday.     aspirin EC 81 MG tablet Take 81 mg by mouth at bedtime.     atorvastatin (LIPITOR) 80 MG tablet Take 1 tablet (80 mg total) by mouth daily at 6 PM. Qty: 30 tablet, Refills: 0    B Complex-C-Folic Acid (NEPHRO-VITE PO) Take 1 tablet by mouth every evening.     colchicine 0.6 MG tablet Take 0.6 mg by mouth daily.    furosemide (LASIX) 40 MG tablet Take 0.5 tablets (20 mg total) by mouth every evening. Qty: 30 tablet, Refills: 1    LANTUS SOLOSTAR 100 UNIT/ML Solostar Pen Inject 6 Units into the skin at bedtime.    lisinopril (PRINIVIL,ZESTRIL) 5 MG tablet Take 1 tablet (5 mg total) by mouth daily. Qty: 30 tablet, Refills: 11    omeprazole (PRILOSEC) 20 MG capsule Take 1 capsule (20 mg total) by mouth 2 (two) times daily before a meal.    vitamin C (ASCORBIC ACID) 500 MG tablet Take 500 mg by mouth daily.    nitroGLYCERIN (NITROSTAT) 0.4 MG SL tablet Place 1 tablet (0.4 mg total) under the tongue every 5 (five) minutes as needed for chest pain. Qty: 30 tablet, Refills: 0      STOP taking these medications     amLODipine (NORVASC) 5 MG tablet        Allergies  Allergen Reactions  . Codeine Itching and Nausea And Vomiting  . [place] Venom [Bee Venom] Swelling      The results of significant diagnostics from this hospitalization (including imaging, microbiology, ancillary and laboratory) are listed below for reference.    Significant Diagnostic Studies: Ct Abdomen Pelvis Wo Contrast  04/12/2015  CLINICAL DATA:  75 year old male presenting from dialysis with hypotension. Initial encounter. EXAM: CT ABDOMEN AND PELVIS  WITHOUT CONTRAST TECHNIQUE: Multidetector CT imaging of the abdomen and pelvis was performed following the standard protocol without IV contrast. COMPARISON:  Noncontrast CT Abdomen and Pelvis 05/19/2012 FINDINGS: Scarring/atelectasis at the left lung base has not significantly changed since 2014. No pericardial or pleural effusion. Minimal scarring or atelectasis at the right lung base. The thoracic esophagus is mildly distended with air in contrast. Scoliosis and advanced degenerative changes in the spine. Chronic erosions of the right proximal femur and acetabulum. There is some associated joint effusion, but this has a chronic appearance and has not significantly changed since 2014. Left hip joint degeneration. No acute osseous abnormality identified. Fluid in the rectum. Mild presacral stranding, nonspecific. No pelvic free fluid. Diminutive urinary bladder. Fluid as well as some gas and stool in the sigmoid colon. Fluid throughout the left colon. Redundant splenic flexure is decompressed in areas but otherwise contains stool. There is a mix of fluid in stool in the transverse colon.  The hepatic flexure is dilated with fluid (up to 6 cm diameter). The ascending colon is mildly dilated with fluid. There is a small fat containing sub xiphoid ventral abdominal hernia (series 2, image 29) but no bowel loops are affected. However, there is a small (2-3 cm) immediately supraumbilical hernia which does contain a portion of the transverse colon which contains fluid. See series 2, image 46). There is oral contrast throughout most of the small bowel which is decompressed throughout. The stomach is distended with oral contrast and gas. Negative duodenum. No abdominal free air or free fluid. Surgically absent gallbladder. Negative non contrast liver, spleen, pancreas (fatty infiltrated), and adrenal glands. Negative noncontrast kidneys and ureters. Aortoiliac calcified atherosclerosis noted. No lymphadenopathy. There is a  chronic partially calcified small fluid collection in the lower rectus abdominal sheath (series 2, image 55) which is unchanged. IMPRESSION: 1. Dominant finding is fluid throughout much of the colon, which can be seen with diarrhea or enema use. No free air or free fluid. 2. Associated intermittent dilatation of the colon, maximal at the hepatic flexure and up to 6 cm diameter. Favor inflammatory ileus. See # 3. 3. There is no small bowel dilatation to strongly indicate a mechanical bowel obstruction. And while there are 2 small ventral abdominal hernias, only that near the umbilicus (series 2, image 46) may have a small incarcerated portion of the transverse colon but this of itself should not be obstructing. Recommend physical exam correlation to see if this small paraumbilical hernia is reducible. 4. No other acute process identified in the abdomen or pelvis. Additional chronic findings detailed above. Electronically Signed   By: Genevie Ann M.D.   On: 04/12/2015 13:08    Microbiology: Recent Results (from the past 240 hour(s))  MRSA PCR Screening     Status: None   Collection Time: 04/12/15  8:00 PM  Result Value Ref Range Status   MRSA by PCR NEGATIVE NEGATIVE Final    Comment:        The GeneXpert MRSA Assay (FDA approved for NASAL specimens only), is one component of a comprehensive MRSA colonization surveillance program. It is not intended to diagnose MRSA infection nor to guide or monitor treatment for MRSA infections.   C difficile quick scan w PCR reflex     Status: None   Collection Time: 04/13/15 11:49 PM  Result Value Ref Range Status   C Diff antigen NEGATIVE NEGATIVE Final   C Diff toxin NEGATIVE NEGATIVE Final   C Diff interpretation Negative for toxigenic C. difficile  Final     Labs: Basic Metabolic Panel:  Recent Labs Lab 04/12/15 1021 04/13/15 0544 04/14/15 0608 04/15/15 0622  NA 139 138 135 134*  K 3.4* 3.6 3.7 3.7  CL 98* 101 98* 98*  CO2 25 26 28 27    GLUCOSE 129* 112* 134* 164*  BUN 53* 70* 38* 46*  CREATININE 5.80* 6.52* 4.48* 5.31*  CALCIUM 7.6* 7.0* 7.3* 7.4*  PHOS  --   --  3.2  --    Liver Function Tests:  Recent Labs Lab 04/12/15 1021  AST 22  ALT 11*  ALKPHOS 64  BILITOT 1.0  PROT 6.2*  ALBUMIN 2.5*    Recent Labs Lab 04/12/15 1021  LIPASE 34   No results for input(s): AMMONIA in the last 168 hours. CBC:  Recent Labs Lab 04/12/15 1021 04/13/15 0544 04/14/15 0608 04/15/15 0622  WBC 10.6* 12.1* 11.8* 8.6  NEUTROABS 8.9*  --   --   --  HGB 10.5* 9.7* 9.1* 8.8*  HCT 30.9* 28.2* 27.3* 26.2*  MCV 93.4 93.1 94.1 93.9  PLT 210 204 183 187   Cardiac Enzymes: No results for input(s): CKTOTAL, CKMB, CKMBINDEX, TROPONINI in the last 168 hours. BNP: BNP (last 3 results) No results for input(s): BNP in the last 8760 hours.  ProBNP (last 3 results) No results for input(s): PROBNP in the last 8760 hours.  CBG:  Recent Labs Lab 04/12/15 1219  GLUCAP 130*       Signed:  Jamy Cleckler MD.  [facility] 04/15/2015, 4:26 PM

## 2015-04-15 NOTE — Progress Notes (Signed)
Patient discharged home.  IV removed - WNL.  Reviewed DC instructions and meds with patient and wife.  Instructed to follow up with PCP. Verbalizes understanding.  No questions at this time.  Stable to DC home, assisted off unit via WC.

## 2015-04-15 NOTE — Progress Notes (Signed)
Subjective: Watery diarrhea 4 times yesterday and twice this morning, no other complaints, no nausea or vomiting, feeling much better  Objective: Vital signs in last 24 hours: Temp:  [98.2 F (36.8 C)-98.9 F (37.2 C)] 98.2 F (36.8 C) (02/25 0550) Pulse Rate:  [72-78] 72 (02/25 0550) Resp:  [20] 20 (02/25 0550) BP: (121-130)/(45-47) 130/45 mmHg (02/25 0550) SpO2:  [93 %-98 %] 95 % (02/25 0550) Weight:  [73.6 kg (162 lb 4.1 oz)] 73.6 kg (162 lb 4.1 oz) (02/25 0550) Weight change: 0.5 kg (1 lb 1.6 oz)  Intake/Output from previous day: 02/24 0701 - 02/25 0700 In: 363 [P.O.:360; I.V.:3] Out: 400 [Urine:400] Intake/Output this shift:   EXAM: General appearance:  Alert, in no apparent distress Resp:  Clear without rales, rhonchi, or wheezes Cardio:  RRR without murmur GI: + BS, soft, mild tenderness in lower abdomen  Extremities:  B BKA, no edema Access:  AVF @ LFA with + bruit  Lab Results:  Recent Labs  04/14/15 0608 04/15/15 0622  WBC 11.8* 8.6  HGB 9.1* 8.8*  HCT 27.3* 26.2*  PLT 183 187   BMET:  Recent Labs  04/12/15 1021  04/14/15 0608 04/15/15 0622  NA 139  < > 135 134*  K 3.4*  < > 3.7 3.7  CL 98*  < > 98* 98*  CO2 25  < > 28 27  GLUCOSE 129*  < > 134* 164*  BUN 53*  < > 38* 46*  CREATININE 5.80*  < > 4.48* 5.31*  CALCIUM 7.6*  < > 7.3* 7.4*  ALBUMIN 2.5*  --   --   --   < > = values in this interval not displayed. No results for input(s): PTH in the last 72 hours. Iron Studies: No results for input(s): IRON, TIBC, TRANSFERRIN, FERRITIN in the last 72 hours.  Studies/Results: No results found.   Assessment/Plan: 1. Gastroenteritis - No nausea or vomiting, but still with diarrhea; uncertain etiology, likely viral. 2. ESRD - on dialysis on MWF, K 3.7 . He si s/p dialysis on Thursday 3. Diabetes 4. HTN - BP generally controlled without meds; no signs or symptoms of fluid overload. 5. CAD - s/p CABG in 1996. No chest pain 6. Anemia - Hgb down to 8.8,  on Epogen 6000 U per HD. His hemoglobin is below our target goal 7. Metabolic bone disease - Ca 7,4, but corrected Ca 8.6 (Alb 2.5), P 3.2, currnetly no binders. It is with in our target goal  Plan:  We [facility] dialyze patient otday     LYLES,CHARLES 04/15/2015,8:06 AM

## 2015-05-12 ENCOUNTER — Encounter (HOSPITAL_COMMUNITY): Payer: Self-pay

## 2015-05-12 ENCOUNTER — Emergency Department (HOSPITAL_COMMUNITY): Payer: Medicare Other

## 2015-05-12 ENCOUNTER — Emergency Department (HOSPITAL_COMMUNITY)
Admission: EM | Admit: 2015-05-12 | Discharge: 2015-05-12 | Disposition: A | Discharge status: Home / Self Care | Payer: Medicare Other | Attending: Emergency Medicine | Admitting: Emergency Medicine

## 2015-05-12 DIAGNOSIS — N186 End stage renal disease: Secondary | ICD-10-CM | POA: Diagnosis not present

## 2015-05-12 DIAGNOSIS — J45909 Unspecified asthma, uncomplicated: Secondary | ICD-10-CM | POA: Insufficient documentation

## 2015-05-12 DIAGNOSIS — Z951 Presence of aortocoronary bypass graft: Secondary | ICD-10-CM | POA: Insufficient documentation

## 2015-05-12 DIAGNOSIS — M1990 Unspecified osteoarthritis, unspecified site: Secondary | ICD-10-CM | POA: Diagnosis not present

## 2015-05-12 DIAGNOSIS — Z87891 Personal history of nicotine dependence: Secondary | ICD-10-CM | POA: Insufficient documentation

## 2015-05-12 DIAGNOSIS — Z992 Dependence on renal dialysis: Secondary | ICD-10-CM | POA: Diagnosis not present

## 2015-05-12 DIAGNOSIS — Z794 Long term (current) use of insulin: Secondary | ICD-10-CM | POA: Diagnosis not present

## 2015-05-12 DIAGNOSIS — Z89521 Acquired absence of right knee: Secondary | ICD-10-CM | POA: Diagnosis not present

## 2015-05-12 DIAGNOSIS — Z79899 Other long term (current) drug therapy: Secondary | ICD-10-CM | POA: Insufficient documentation

## 2015-05-12 DIAGNOSIS — I953 Hypotension of hemodialysis: Secondary | ICD-10-CM | POA: Diagnosis not present

## 2015-05-12 DIAGNOSIS — I639 Cerebral infarction, unspecified: Secondary | ICD-10-CM | POA: Insufficient documentation

## 2015-05-12 DIAGNOSIS — I120 Hypertensive chronic kidney disease with stage 5 chronic kidney disease or end stage renal disease: Secondary | ICD-10-CM | POA: Insufficient documentation

## 2015-05-12 DIAGNOSIS — Z7982 Long term (current) use of aspirin: Secondary | ICD-10-CM | POA: Insufficient documentation

## 2015-05-12 DIAGNOSIS — E1152 Type 2 diabetes mellitus with diabetic peripheral angiopathy with gangrene: Secondary | ICD-10-CM | POA: Insufficient documentation

## 2015-05-12 DIAGNOSIS — I2510 Atherosclerotic heart disease of native coronary artery without angina pectoris: Secondary | ICD-10-CM | POA: Diagnosis not present

## 2015-05-12 DIAGNOSIS — E1122 Type 2 diabetes mellitus with diabetic chronic kidney disease: Secondary | ICD-10-CM | POA: Insufficient documentation

## 2015-05-12 LAB — BASIC METABOLIC PANEL
Anion gap: 8 (ref 5–15)
BUN: 16 mg/dL (ref 6–20)
CALCIUM: 7.5 mg/dL — AB (ref 8.9–10.3)
CO2: 26 mmol/L (ref 22–32)
Chloride: 100 mmol/L — ABNORMAL LOW (ref 101–111)
Creatinine, Ser: 2.63 mg/dL — ABNORMAL HIGH (ref 0.61–1.24)
GFR calc Af Amer: 26 mL/min — ABNORMAL LOW (ref >60)
GFR, EST NON AFRICAN AMERICAN: 22 mL/min — AB (ref >60)
Glucose, Bld: 180 mg/dL — ABNORMAL HIGH (ref 65–99)
POTASSIUM: 3.3 mmol/L — AB (ref 3.5–5.1)
SODIUM: 134 mmol/L — AB (ref 135–145)

## 2015-05-12 LAB — CBC WITH DIFFERENTIAL/PLATELET
BASOS ABS: 0 10*3/uL (ref 0.0–0.1)
Basophils Relative: 0 %
EOS ABS: 0.1 10*3/uL (ref 0.0–0.7)
EOS PCT: 1 %
HCT: 34.9 % — ABNORMAL LOW (ref 39.0–52.0)
Hemoglobin: 11.2 g/dL — ABNORMAL LOW (ref 13.0–17.0)
LYMPHS ABS: 1.2 10*3/uL (ref 0.7–4.0)
Lymphocytes Relative: 8 %
MCH: 30.7 pg (ref 26.0–34.0)
MCHC: 32.1 g/dL (ref 30.0–36.0)
MCV: 95.6 fL (ref 78.0–100.0)
Monocytes Absolute: 1.1 10*3/uL — ABNORMAL HIGH (ref 0.1–1.0)
Monocytes Relative: 7 %
Neutro Abs: 12.1 10*3/uL — ABNORMAL HIGH (ref 1.7–7.7)
Neutrophils Relative %: 84 %
PLATELETS: 251 10*3/uL (ref 150–400)
RBC: 3.65 MIL/uL — AB (ref 4.22–5.81)
RDW: 13.4 % (ref 11.5–15.5)
WBC: 14.4 10*3/uL — AB (ref 4.0–10.5)

## 2015-05-12 LAB — CBG MONITORING, ED: GLUCOSE-CAPILLARY: 167 mg/dL — AB (ref 65–99)

## 2015-05-12 LAB — TROPONIN I

## 2015-05-12 NOTE — Discharge Instructions (Signed)
Tests showed no serious findings. Follow-up your normal dialysis on Monday.

## 2015-05-12 NOTE — ED Notes (Signed)
Dialysis RN in room with pt. Family contacted to pick pt up after discharge.

## 2015-05-12 NOTE — ED Notes (Signed)
Pt with large loose stool. Pt cleaned, linens cleaned. Pt repositioned and pads placed for diarrhea.

## 2015-05-12 NOTE — ED Notes (Signed)
Primary assessment completed at 1430.

## 2015-05-12 NOTE — ED Provider Notes (Addendum)
CSN: [id number]     Arrival date & time 05/12/15  1415 History   First MD Initiated Contact with Patient 05/12/15 [id number]     Chief Complaint  Patient presents with  . Hypotension     (Consider location/radiation/quality/duration/timing/severity/associated sxs/prior Treatment) HPI..... Patient has end-stage renal disease and is on dialysis Monday Wednesday Friday. He was brought here because of blood pressure was on the low side. He complains of minimal chest pain but this is fleeting. No dyspnea, fever, chills, diaphoresis, nausea. He has multiple health care problems well documented in his past medical history.  Past Medical History  Diagnosis Date  . Diabetes mellitus   . Hypertension   . Gout   . Coronary artery disease 2000    Stents [facility]  . Stroke ([place])   . Asthma   . Hypercholesteremia   . Arthritis   . DDD (degenerative disc disease), lumbar   . DDD (degenerative disc disease), cervical   . Gangrene of toe ([place]) Feb. 2015    Right  great    . Gastric ulcer     EGD 2014  . 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 Clotted renal dialysis arteriovenous graft ([place])   . CVA (cerebral infarction)   . ESRD (end stage renal disease) on dialysis [facility])    Past Surgical History  Procedure Laterality Date  . Coronary artery bypass graft  1996    [facility]  . Below knee leg amputation Bilateral   . Esophagogastroduodenoscopy Left 05/24/2012    [id number] dilated baggy but otherwise a normal/ Small hiatal hernia. Gastric ulcer -S/P biopsy  . Amputation Right 04/10/2013    Procedure: Right Below Knee Amputation;  Surgeon: Newt Minion, MD;  Location: [place];  Service: Orthopedics;  Laterality: Right;  Right Below Knee Amputation  . Appendectomy    . Peripheral vascular catheterization N/A 08/09/2014    Procedure: A/V Shuntogram/Fistulagram;  Surgeon: Katha Cabal, MD;  Location: [place] [facility];  Service: Cardiovascular;  Laterality: N/A;  . Peripheral vascular  catheterization Left 08/09/2014    Procedure: A/V Shunt Intervention;  Surgeon: Katha Cabal, MD;  Location: [place] [facility];  Service: Cardiovascular;  Laterality: Left;  . Cholecystectomy     Family History  Problem Relation Age of Onset  . Colon cancer Neg Hx   . Cancer Mother    Social History  Substance Use Topics  . Smoking status: Former Smoker    Quit date: 08/08/2004  . Smokeless tobacco: Never Used  . Alcohol Use: No    Review of Systems  All other systems reviewed and are negative.     Allergies  Codeine and Yellow jacket venom  Home Medications   Prior to Admission medications   Medication Sig Start Date End Date Taking? Authorizing Provider  acetaminophen (TYLENOL) 500 MG tablet Take 500 mg by mouth 2 (two) times daily.   Yes Historical Provider, MD  Amino Acid Infusion (PROSOL) 20 % SOLN every Monday, Wednesday, and Friday.  10/27/14  Yes Historical Provider, MD  amLODipine (NORVASC) 5 MG tablet Take 5 mg by mouth daily.   Yes Historical Provider, MD  aspirin EC 81 MG tablet Take 81 mg by mouth at bedtime.    Yes Historical Provider, MD  B Complex-C-Folic Acid (NEPHRO-VITE PO) Take 1 tablet by mouth every evening.    Yes Historical Provider, MD  furosemide (LASIX) 40 MG tablet Take 0.5 tablets (20 mg total) by mouth every evening. 12/22/14  Yes Lendon Colonel, NP  LANTUS SOLOSTAR 100 UNIT/ML Solostar Pen Inject 8 Units into the skin every evening.  12/21/13  Yes Historical Provider, MD  lisinopril (PRINIVIL,ZESTRIL) 5 MG tablet Take 1 tablet (5 mg total) by mouth daily. 12/22/14  Yes Lendon Colonel, NP  loperamide (IMODIUM) 2 MG capsule Take 1 capsule (2 mg total) by mouth 4 (four) times daily as needed for diarrhea or loose stools. 04/15/15  Yes Kathie Dike, MD  omeprazole (PRILOSEC) 20 MG capsule Take 1 capsule (20 mg total) by mouth 2 (two) times daily before a meal. 06/17/13  Yes Radene Gunning, NP  vitamin C (ASCORBIC ACID) 500 MG tablet Take 500  mg by mouth daily.   Yes Historical Provider, MD  atorvastatin (LIPITOR) 80 MG tablet Take 1 tablet (80 mg total) by mouth daily at 6 PM. Patient not taking: Reported on 05/12/2015 11/18/14   Samuella Cota, MD  nitroGLYCERIN (NITROSTAT) 0.4 MG SL tablet Place 1 tablet (0.4 mg total) under the tongue every 5 (five) minutes as needed for chest pain. 11/18/14   Samuella Cota, MD   BP 94/47 mmHg  Pulse 84  Temp(Src) 97.6 F (36.4 C) (Oral)  Resp 19  SpO2 95% Physical Exam  Constitutional: He is oriented to person, place, and time. He appears well-developed and well-nourished.  HENT:  Head: Normocephalic and atraumatic.  Eyes: Conjunctivae and EOM are normal. Pupils are equal, round, and reactive to light.  Neck: Normal range of motion. Neck supple.  Cardiovascular: Normal rate and regular rhythm.   Pulmonary/Chest: Effort normal and breath sounds normal.  Abdominal: Soft. Bowel sounds are normal.  Musculoskeletal:  Bilateral BKA  Neurological: He is alert and oriented to person, place, and time.  Skin: Skin is warm and dry.  Psychiatric: He has a normal mood and affect. His behavior is normal.  Nursing note and vitals reviewed.   ED Course  Procedures (including critical care time) Labs Review Labs Reviewed  CBC WITH DIFFERENTIAL/PLATELET - Abnormal; Notable for the following:    WBC 14.4 (*)    RBC 3.65 (*)    Hemoglobin 11.2 (*)    HCT 34.9 (*)    Neutro Abs 12.1 (*)    Monocytes Absolute 1.1 (*)    All other components within normal limits  BASIC METABOLIC PANEL - Abnormal; Notable for the following:    Sodium 134 (*)    Potassium 3.3 (*)    Chloride 100 (*)    Glucose, Bld 180 (*)    Creatinine, Ser 2.63 (*)    Calcium 7.5 (*)    GFR calc non Af Amer 22 (*)    GFR calc Af Amer 26 (*)    All other components within normal limits  CBG MONITORING, ED - Abnormal; Notable for the following:    Glucose-Capillary 167 (*)    All other components within normal limits   TROPONIN I    Imaging Review Dg Chest Port 1 View  05/12/2015  CLINICAL DATA:  Hypotension. EXAM: PORTABLE CHEST 1 VIEW COMPARISON:  11/16/2014 FINDINGS: Sequelae of prior CABG are again identified. Thoracic aorta is tortuous. Cardiac silhouette is unchanged in size. Lung volumes are diminished, slightly more so than on the prior study. There is a similar appearance of left basilar scarring or atelectasis. The right lung is grossly clear. No sizable pleural effusion or pneumothorax is identified. Degenerative changes are partially visualized about the shoulders, and there is thoracic dextroscoliosis. IMPRESSION: Low lung volumes with  left basilar atelectasis or scarring. Electronically Signed   By: Logan Bores M.D.   On: 05/12/2015 16:23   I have personally reviewed and evaluated these images and lab results as part of my medical decision-making.   EKG Interpretation   Date/Time:  Friday May 12 2015 14:26:27 EDT Ventricular Rate:  89 PR Interval:  171 QRS Duration: 91 QT Interval:  388 QTC Calculation: 472 R Axis:   19 Text Interpretation:   he's very popular Sinus rhythm Probable left atrial  enlargement Anteroseptal infarct, old Confirmed by Emersynn Deatley  MD, [place] ([id number])  on 05/12/2015 3:50:00 PM      MDM   Final diagnoses:  Hemodialysis-associated hypotension  ESRD (end stage renal disease) ([place])    Patient is well-known to me. He appears well. Blood pressure slightly soft. He does not appear in acute distress. Screening labs, EKG, chest x-ray show no acute findings.  Discussed with nephrologist on call. He stated it would be okay to get his dialysis on Monday    Nat Christen, MD 05/12/15 2005  Nat Christen, MD 05/12/15 2006  Nat Christen, MD 05/12/15 2113

## 2015-05-12 NOTE — Progress Notes (Signed)
   HEMODIALYSIS NURSING NOTE:  15g needles removed from left forearm AVF without difficulty.  Hemostasis achieved within 10 minutes. Palpable thrill in AVF.  Rockwell Alexandria, RN, CDN

## 2015-05-12 NOTE — ED Notes (Signed)
Pt here from dialysis due to being hypotension. Per EMS, pt was hypotensive prior to having dialysis.

## 2015-05-12 NOTE — ED Notes (Signed)
Per pt's family he cannot take Atorvastatin and the muscle relaxer on his med list, causes muscle weakness.

## 2015-05-22 ENCOUNTER — Other Ambulatory Visit: Payer: Self-pay | Admitting: Adult Health

## 2015-06-13 ENCOUNTER — Ambulatory Visit: Payer: Medicare Other | Admitting: Urology

## 2015-07-11 ENCOUNTER — Ambulatory Visit (INDEPENDENT_AMBULATORY_CARE_PROVIDER_SITE_OTHER): Payer: Medicare Other | Admitting: Urology

## 2015-07-11 DIAGNOSIS — N5201 Erectile dysfunction due to arterial insufficiency: Secondary | ICD-10-CM

## 2015-07-11 DIAGNOSIS — N189 Chronic kidney disease, unspecified: Secondary | ICD-10-CM

## 2015-07-11 DIAGNOSIS — N3020 Other chronic cystitis without hematuria: Secondary | ICD-10-CM

## 2015-07-11 DIAGNOSIS — R32 Unspecified urinary incontinence: Secondary | ICD-10-CM | POA: Diagnosis not present

## 2015-08-12 ENCOUNTER — Other Ambulatory Visit: Payer: Self-pay

## 2015-08-12 ENCOUNTER — Other Ambulatory Visit (HOSPITAL_COMMUNITY): Payer: Self-pay

## 2015-08-12 ENCOUNTER — Observation Stay (HOSPITAL_COMMUNITY)
Admission: EM | Admit: 2015-08-12 | Discharge: 2015-08-15 | Disposition: A | Discharge status: Home / Self Care | Payer: Medicare Other | Attending: Internal Medicine | Admitting: Internal Medicine

## 2015-08-12 ENCOUNTER — Emergency Department (HOSPITAL_COMMUNITY): Payer: Medicare Other

## 2015-08-12 ENCOUNTER — Encounter (HOSPITAL_COMMUNITY): Payer: Self-pay | Admitting: Cardiology

## 2015-08-12 DIAGNOSIS — I10 Essential (primary) hypertension: Secondary | ICD-10-CM | POA: Diagnosis not present

## 2015-08-12 DIAGNOSIS — E1122 Type 2 diabetes mellitus with diabetic chronic kidney disease: Secondary | ICD-10-CM | POA: Insufficient documentation

## 2015-08-12 DIAGNOSIS — M869 Osteomyelitis, unspecified: Secondary | ICD-10-CM

## 2015-08-12 DIAGNOSIS — I2510 Atherosclerotic heart disease of native coronary artery without angina pectoris: Secondary | ICD-10-CM | POA: Diagnosis not present

## 2015-08-12 DIAGNOSIS — R0789 Other chest pain: Secondary | ICD-10-CM

## 2015-08-12 DIAGNOSIS — N12 Tubulo-interstitial nephritis, not specified as acute or chronic: Secondary | ICD-10-CM | POA: Diagnosis not present

## 2015-08-12 DIAGNOSIS — Z8673 Personal history of transient ischemic attack (TIA), and cerebral infarction without residual deficits: Secondary | ICD-10-CM | POA: Diagnosis not present

## 2015-08-12 DIAGNOSIS — Z79899 Other long term (current) drug therapy: Secondary | ICD-10-CM | POA: Insufficient documentation

## 2015-08-12 DIAGNOSIS — E875 Hyperkalemia: Secondary | ICD-10-CM

## 2015-08-12 DIAGNOSIS — K529 Noninfective gastroenteritis and colitis, unspecified: Secondary | ICD-10-CM

## 2015-08-12 DIAGNOSIS — E1169 Type 2 diabetes mellitus with other specified complication: Secondary | ICD-10-CM

## 2015-08-12 DIAGNOSIS — R079 Chest pain, unspecified: Secondary | ICD-10-CM

## 2015-08-12 DIAGNOSIS — E43 Unspecified severe protein-calorie malnutrition: Secondary | ICD-10-CM

## 2015-08-12 DIAGNOSIS — Z87891 Personal history of nicotine dependence: Secondary | ICD-10-CM | POA: Insufficient documentation

## 2015-08-12 DIAGNOSIS — N390 Urinary tract infection, site not specified: Principal | ICD-10-CM | POA: Diagnosis present

## 2015-08-12 DIAGNOSIS — Z992 Dependence on renal dialysis: Secondary | ICD-10-CM | POA: Diagnosis not present

## 2015-08-12 DIAGNOSIS — M1990 Unspecified osteoarthritis, unspecified site: Secondary | ICD-10-CM | POA: Diagnosis not present

## 2015-08-12 DIAGNOSIS — I120 Hypertensive chronic kidney disease with stage 5 chronic kidney disease or end stage renal disease: Secondary | ICD-10-CM | POA: Diagnosis not present

## 2015-08-12 DIAGNOSIS — I96 Gangrene, not elsewhere classified: Secondary | ICD-10-CM

## 2015-08-12 DIAGNOSIS — J45909 Unspecified asthma, uncomplicated: Secondary | ICD-10-CM | POA: Insufficient documentation

## 2015-08-12 DIAGNOSIS — R112 Nausea with vomiting, unspecified: Secondary | ICD-10-CM | POA: Diagnosis present

## 2015-08-12 DIAGNOSIS — E1159 Type 2 diabetes mellitus with other circulatory complications: Secondary | ICD-10-CM | POA: Diagnosis not present

## 2015-08-12 DIAGNOSIS — R531 Weakness: Secondary | ICD-10-CM | POA: Diagnosis present

## 2015-08-12 DIAGNOSIS — N186 End stage renal disease: Secondary | ICD-10-CM

## 2015-08-12 DIAGNOSIS — E0590 Thyrotoxicosis, unspecified without thyrotoxic crisis or storm: Secondary | ICD-10-CM | POA: Diagnosis present

## 2015-08-12 DIAGNOSIS — Z7982 Long term (current) use of aspirin: Secondary | ICD-10-CM | POA: Diagnosis not present

## 2015-08-12 DIAGNOSIS — R7881 Bacteremia: Secondary | ICD-10-CM

## 2015-08-12 DIAGNOSIS — E785 Hyperlipidemia, unspecified: Secondary | ICD-10-CM

## 2015-08-12 DIAGNOSIS — Z89512 Acquired absence of left leg below knee: Secondary | ICD-10-CM

## 2015-08-12 DIAGNOSIS — Z89511 Acquired absence of right leg below knee: Secondary | ICD-10-CM

## 2015-08-12 HISTORY — DX: Thyrotoxicosis, unspecified without thyrotoxic crisis or storm: E05.90

## 2015-08-12 LAB — COMPREHENSIVE METABOLIC PANEL
ALBUMIN: 2.6 g/dL — AB (ref 3.5–5.0)
ALT: 12 U/L — AB (ref 17–63)
AST: 12 U/L — AB (ref 15–41)
Alkaline Phosphatase: 68 U/L (ref 38–126)
Anion gap: 14 (ref 5–15)
BILIRUBIN TOTAL: 1.6 mg/dL — AB (ref 0.3–1.2)
BUN: 50 mg/dL — ABNORMAL HIGH (ref 6–20)
CO2: 26 mmol/L (ref 22–32)
Calcium: 7.8 mg/dL — ABNORMAL LOW (ref 8.9–10.3)
Chloride: 96 mmol/L — ABNORMAL LOW (ref 101–111)
Creatinine, Ser: 5.21 mg/dL — ABNORMAL HIGH (ref 0.61–1.24)
GFR calc Af Amer: 11 mL/min — ABNORMAL LOW (ref >60)
GFR, EST NON AFRICAN AMERICAN: 10 mL/min — AB (ref >60)
GLUCOSE: 200 mg/dL — AB (ref 65–99)
Potassium: 3.9 mmol/L (ref 3.5–5.1)
Sodium: 136 mmol/L (ref 135–145)
TOTAL PROTEIN: 6 g/dL — AB (ref 6.5–8.1)

## 2015-08-12 LAB — URINALYSIS, ROUTINE W REFLEX MICROSCOPIC
GLUCOSE, UA: 100 mg/dL — AB
Nitrite: POSITIVE — AB
SPECIFIC GRAVITY, URINE: 1.015 (ref 1.005–1.030)
pH: 6 (ref 5.0–8.0)

## 2015-08-12 LAB — URINE MICROSCOPIC-ADD ON

## 2015-08-12 LAB — CBC WITH DIFFERENTIAL/PLATELET
BASOS ABS: 0 10*3/uL (ref 0.0–0.1)
BASOS PCT: 0 %
Eosinophils Absolute: 0 10*3/uL (ref 0.0–0.7)
Eosinophils Relative: 0 %
HEMATOCRIT: 35.8 % — AB (ref 39.0–52.0)
HEMOGLOBIN: 11.9 g/dL — AB (ref 13.0–17.0)
LYMPHS PCT: 15 %
Lymphs Abs: 1.3 10*3/uL (ref 0.7–4.0)
MCH: 30.6 pg (ref 26.0–34.0)
MCHC: 33.2 g/dL (ref 30.0–36.0)
MCV: 92 fL (ref 78.0–100.0)
Monocytes Absolute: 0.3 10*3/uL (ref 0.1–1.0)
Monocytes Relative: 4 %
NEUTROS ABS: 7.2 10*3/uL (ref 1.7–7.7)
NEUTROS PCT: 81 %
Platelets: 215 10*3/uL (ref 150–400)
RBC: 3.89 MIL/uL — AB (ref 4.22–5.81)
RDW: 14.1 % (ref 11.5–15.5)
WBC: 8.9 10*3/uL (ref 4.0–10.5)

## 2015-08-12 LAB — GLUCOSE, CAPILLARY: Glucose-Capillary: 209 mg/dL — ABNORMAL HIGH (ref 65–99)

## 2015-08-12 LAB — CBG MONITORING, ED: GLUCOSE-CAPILLARY: 191 mg/dL — AB (ref 65–99)

## 2015-08-12 LAB — MRSA PCR SCREENING: MRSA BY PCR: NEGATIVE

## 2015-08-12 LAB — BRAIN NATRIURETIC PEPTIDE: B NATRIURETIC PEPTIDE 5: 114 pg/mL — AB (ref 0.0–100.0)

## 2015-08-12 MED ORDER — PANTOPRAZOLE SODIUM 40 MG PO TBEC: 40.0000 mg | DELAYED_RELEASE_TABLET | Freq: Every day | ORAL | Status: DC

## 2015-08-12 MED ORDER — ASPIRIN EC 81 MG PO TBEC
81.0000 mg | DELAYED_RELEASE_TABLET | Freq: Every day | ORAL | Status: DC
  Administered 2015-08-12 – 2015-08-14 (×3): 81 mg via ORAL
  Filled 2015-08-12 (×3): qty 1

## 2015-08-12 MED ORDER — CEFTRIAXONE SODIUM 1 G IJ SOLR
1.0000 g | Freq: Once | INTRAMUSCULAR | Status: AC
  Administered 2015-08-12: 1 g via INTRAVENOUS
  Filled 2015-08-12: qty 10

## 2015-08-12 MED ORDER — MUPIROCIN 2 % EX OINT: 1.0000 "application " | TOPICAL_OINTMENT | Freq: Two times a day (BID) | CUTANEOUS | Status: DC

## 2015-08-12 MED ORDER — HEPARIN SODIUM (PORCINE) 5000 UNIT/ML IJ SOLN
5000.0000 [IU] | Freq: Three times a day (TID) | INTRAMUSCULAR | Status: DC
  Administered 2015-08-12 – 2015-08-15 (×7): 5000 [IU] via SUBCUTANEOUS
  Filled 2015-08-12 (×7): qty 1

## 2015-08-12 MED ORDER — ACETAMINOPHEN 500 MG PO TABS
500.0000 mg | ORAL_TABLET | Freq: Two times a day (BID) | ORAL | Status: DC
  Administered 2015-08-12: 500 mg via ORAL
  Filled 2015-08-12: qty 1

## 2015-08-12 MED ORDER — ONDANSETRON HCL 4 MG/2ML IJ SOLN
4.0000 mg | Freq: Once | INTRAMUSCULAR | Status: AC
  Administered 2015-08-12: 4 mg via INTRAVENOUS
  Filled 2015-08-12: qty 2

## 2015-08-12 MED ORDER — AMLODIPINE BESYLATE 5 MG PO TABS: 5.0000 mg | ORAL_TABLET | Freq: Every day | ORAL | Status: DC

## 2015-08-12 MED ORDER — DEXTROSE 5 % IV SOLN
1.0000 g | INTRAVENOUS | Status: DC
  Administered 2015-08-13 – 2015-08-14 (×2): 1 g via INTRAVENOUS
  Filled 2015-08-12 (×3): qty 10

## 2015-08-12 MED ORDER — DEXTROSE-NACL 5-0.9 % IV SOLN
INTRAVENOUS | Status: DC
  Administered 2015-08-12 – 2015-08-14 (×3): via INTRAVENOUS

## 2015-08-12 MED ORDER — CHLORHEXIDINE GLUCONATE CLOTH 2 % EX PADS: 6.0000 | MEDICATED_PAD | Freq: Every day | CUTANEOUS | Status: DC

## 2015-08-12 MED ORDER — NEPHRO-VITE 0.8 MG PO TABS
1.0000 | ORAL_TABLET | Freq: Every evening | ORAL | Status: DC
  Administered 2015-08-12: 1 via ORAL
  Filled 2015-08-12 (×3): qty 1

## 2015-08-12 NOTE — H&P (Signed)
History and Physical    Aizik Humble [id number] DOB: Apr 24, 1940 DOA: 08/12/2015  Referring MD/NP/PA: Milton Ferguson, MD PCP: PROVIDER NOT IN SYSTEM  Outpatient Specialists:   Nephrology; Fran Lowes, MD  Patient coming from: dysuria, emesis, generalized weakness  Chief Complaint: Dysuria, emesis, and generalized weakness  HPI: Alan [name] is a 75 y.o. male with medical history significant of CAD s/p CABG, DM type 2, HTN, HLD, and ESRD, presented with complaints of constant dysuria, emesis and weakness onset two days ago. He reports associated transient fever and chills. He vomited some yesterday and today. Denies any diarrhea. Patient requires dialysis and had to end his session an hour early yesterday. He has not had an appetite since Friday.  ED Course: While in the ED, UA was indicative of infection. LFTs were abnormal. BNP returned mildly elevated. WBC was wnl. CXR showed no active cardiopulmonary disease. Hospitalist was asked to refer for admission for further management of pyelonephritis.   Review of Systems: As per HPI otherwise 10 point review of systems negative.   Past Medical History  Diagnosis Date  . Diabetes mellitus   . Hypertension   . Gout   . Coronary artery disease 2000    Stents [facility]  . Stroke ([place])   . Asthma   . Hypercholesteremia   . Arthritis   . DDD (degenerative disc disease), lumbar   . DDD (degenerative disc disease), cervical   . Gangrene of toe ([place]) Feb. 2015    Right  great    . Gastric ulcer     EGD 2014  . 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 Clotted renal dialysis arteriovenous graft ([place])   . CVA (cerebral infarction)   . ESRD (end stage renal disease) on dialysis [facility])     Past Surgical History  Procedure Laterality Date  . Coronary artery bypass graft  1996    [facility]  . Below knee leg amputation Bilateral   . Esophagogastroduodenoscopy Left 05/24/2012    [id number] dilated baggy but otherwise a  normal/ Small hiatal hernia. Gastric ulcer -S/P biopsy  . Amputation Right 04/10/2013    Procedure: Right Below Knee Amputation;  Surgeon: Newt Minion, MD;  Location: [place];  Service: Orthopedics;  Laterality: Right;  Right Below Knee Amputation  . Appendectomy    . Peripheral vascular catheterization N/A 08/09/2014    Procedure: A/V Shuntogram/Fistulagram;  Surgeon: Katha Cabal, MD;  Location: [place] [facility];  Service: Cardiovascular;  Laterality: N/A;  . Peripheral vascular catheterization Left 08/09/2014    Procedure: A/V Shunt Intervention;  Surgeon: Katha Cabal, MD;  Location: [place] [facility];  Service: Cardiovascular;  Laterality: Left;  . Cholecystectomy       reports that he quit smoking about 11 years ago. He has never used smokeless tobacco. He reports that he does not drink alcohol or use illicit drugs.  Allergies  Allergen Reactions  . Codeine Itching and Nausea And Vomiting  . Yellow Jacket Venom [Bee Venom] Swelling    Family History  Problem Relation Age of Onset  . Colon cancer Neg Hx   . Cancer Mother     Prior to Admission medications   Medication Sig Start Date End Date Taking? Authorizing Provider  Amino Acid Infusion (PROSOL) 20 % SOLN every Monday, Wednesday, and Friday.  10/27/14  Yes Historical Provider, MD  amLODipine (NORVASC) 5 MG tablet Take 5 mg by mouth daily.   Yes Historical Provider, MD  aspirin EC 81  MG tablet Take 81 mg by mouth at bedtime.    Yes Historical Provider, MD  B Complex-C-Folic Acid (NEPHRO-VITE PO) Take 1 tablet by mouth every evening.    Yes Historical Provider, MD  furosemide (LASIX) 40 MG tablet TAKE 1/2 TABLET BY MOUTH EVERY EVENING 05/22/15  Yes Lendon Colonel, NP  LANTUS SOLOSTAR 100 UNIT/ML Solostar Pen Inject 8 Units into the skin every evening.  12/21/13  Yes Historical Provider, MD  nitroGLYCERIN (NITROSTAT) 0.4 MG SL tablet Place 1 tablet (0.4 mg total) under the tongue every 5 (five) minutes as needed  for chest pain. 11/18/14  Yes Samuella Cota, MD  omeprazole (PRILOSEC) 20 MG capsule Take 1 capsule (20 mg total) by mouth 2 (two) times daily before a meal. 06/17/13  Yes Radene Gunning, NP  acetaminophen (TYLENOL) 500 MG tablet Take 500 mg by mouth 2 (two) times daily.    Historical Provider, MD  atorvastatin (LIPITOR) 80 MG tablet Take 1 tablet (80 mg total) by mouth daily at 6 PM. Patient not taking: Reported on 05/12/2015 11/18/14   Samuella Cota, MD  lisinopril (PRINIVIL,ZESTRIL) 5 MG tablet Take 1 tablet (5 mg total) by mouth daily. Patient not taking: Reported on 08/12/2015 12/22/14   Lendon Colonel, NP    Physical Exam: Filed Vitals:   08/12/15 1659 08/12/15 1730 08/12/15 1800 08/12/15 1830  BP: 153/83 130/63 145/59 143/73  Pulse: 87 77 74 81  Temp: 97.6 F (36.4 C)     TempSrc: Oral     Resp: 18 23 19 22   Height: 5\' 9"  (1.753 m)     Weight: 88 kg (194 lb 0.1 oz)     SpO2: 100% 96% 95% 97%   Constitutional: NAD, calm, comfortable Filed Vitals:   08/12/15 1659 08/12/15 1730 08/12/15 1800 08/12/15 1830  BP: 153/83 130/63 145/59 143/73  Pulse: 87 77 74 81  Temp: 97.6 F (36.4 C)     TempSrc: Oral     Resp: 18 23 19 22   Height: 5\' 9"  (1.753 m)     Weight: 88 kg (194 lb 0.1 oz)     SpO2: 100% 96% 95% 97%   Eyes: PERRL, lids and conjunctivae normal ENMT: Mucous membranes are moist. Posterior pharynx clear of any exudate or lesions.Normal dentition.  Neck: normal, supple, no masses, no thyromegaly Respiratory: clear to auscultation bilaterally, no wheezing, no crackles. Normal respiratory effort. No accessory muscle use.  Cardiovascular: Regular rate and rhythm, no murmurs / rubs / gallops. No extremity edema. 2+ pedal pulses. No carotid bruits.  Abdomen: no tenderness, no masses palpated. No hepatosplenomegaly. Bowel sounds positive.  Musculoskeletal: no clubbing / cyanosis. No joint deformity upper and lower extremities. Good ROM, no contractures. Normal muscle tone.  -BLE amputation Skin: no rashes, lesions, ulcers. No induration Neurologic: CN 2-12 grossly intact. Sensation intact, DTR normal. Strength 5/5 in all 4.  Psychiatric: Normal judgment and insight. Alert and oriented x 3. Normal mood.   Labs on Admission: I have personally reviewed following labs and imaging studies  CBC:  Recent Labs Lab 08/12/15 1731  WBC 8.9  NEUTROABS 7.2  HGB 11.9*  HCT 35.8*  MCV 92.0  PLT 123456   Basic Metabolic Panel:  Recent Labs Lab 08/12/15 1731  NA 136  K 3.9  CL 96*  CO2 26  GLUCOSE 200*  BUN 50*  CREATININE 5.21*  CALCIUM 7.8*   GFR: Estimated Creatinine Clearance: 13.2 mL/min (by C-G formula based on Cr of 5.21). Liver  Function Tests:  Recent Labs Lab 08/12/15 1731  AST 12*  ALT 12*  ALKPHOS 68  BILITOT 1.6*  PROT 6.0*  ALBUMIN 2.6*   CBG:  Recent Labs Lab 08/12/15 1702  GLUCAP 191*   Urine analysis:    Component Value Date/Time   COLORURINE AMBER* 08/12/2015 1715   APPEARANCEUR CLOUDY* 08/12/2015 1715   LABSPEC 1.015 08/12/2015 1715   PHURINE 6.0 08/12/2015 1715   GLUCOSEU 100* 08/12/2015 1715   HGBUR LARGE* 08/12/2015 1715   BILIRUBINUR SMALL* 08/12/2015 1715   KETONESUR TRACE* 08/12/2015 1715   PROTEINUR >300* 08/12/2015 1715   UROBILINOGEN 4.0* 11/05/2014 1930   NITRITE POSITIVE* 08/12/2015 1715   LEUKOCYTESUR MODERATE* 08/12/2015 1715   Radiological Exams on Admission: Dg Chest Portable 1 View  08/12/2015  CLINICAL DATA:  Vomiting, weakness, pain with urination, cough EXAM: PORTABLE CHEST 1 VIEW COMPARISON:  05/12/2015 FINDINGS: Cardiomediastinal silhouette is stable. No acute infiltrate or pleural effusion. No pulmonary edema. Degenerative changes bilateral shoulders again noted. Dextroscoliosis lower thoracic spine. IMPRESSION: No active disease. Electronically Signed   By: Lahoma Crocker M.D.   On: 08/12/2015 17:29    EKG: Independently reviewed.   Assessment/Plan Principal Problem:   Pyelonephritis Active  Problems:   CAD (coronary artery disease)   DM type 2 causing vascular disease (HCC)   HTN (hypertension), benign   ESRD on dialysis ([place])  1. Pyelonephritis. Currently afebrile, but he had chills at home.  WBC wnl. UA indicative of infection. Start on IV Rocephin. Zofran PRN. If patient is still here on Monday, please consult nephrology.  2. ESRD on dialysis. He is not scheduled for dialysis until Monday. [place] give gentle fluids given he wasn't able to eat or drink for a few days.  3. DM type 2. Blood sugars mildly elevated. Start on SSI.  Hold basal Lantus.  4. HTN. Continue outpatient regimen.  5. HLD. Continue statins.  6. CAD s/p CABG. Stable.   DVT prophylaxis: Heparin Code Status: Full Family Communication: Discussed with patient and wife at bedside. Disposition Plan: admit to medical floor. Discharge home once improved Consults called: None Admission status: admit as observation.  Orvan Falconer,  MD FACP [facility]  If 7PM-7AM, please contact night-coverage [email or web address] Password [facility] 08/12/2015, 7:26 PM   By signing my name below, I, Delene Ruffini, attest that this documentation has been prepared under the direction and in the presence of Orvan Falconer, MD. Electronically Signed: Delene Ruffini, Scribe 08/12/2015 7:30pm

## 2015-08-12 NOTE — ED Provider Notes (Signed)
CSN: [id number]     Arrival date & time 08/12/15  1653 History   First MD Initiated Contact with Patient 08/12/15 1655     Chief Complaint  Patient presents with  . Weakness     (Consider location/radiation/quality/duration/timing/severity/associated sxs/prior Treatment) Patient is a 75 y.o. male presenting with weakness. The history is provided by the patient (Patient presents with vomiting fevers and chills for couple days. Also dysuria).  Weakness This is a new problem. The current episode started 2 days ago. The problem occurs constantly. The problem has not changed since onset.Pertinent negatives include no chest pain, no abdominal pain and no headaches. Nothing aggravates the symptoms. Nothing relieves the symptoms.    Past Medical History  Diagnosis Date  . Diabetes mellitus   . Hypertension   . Gout   . Coronary artery disease 2000    Stents [facility]  . Stroke ([place])   . Asthma   . Hypercholesteremia   . Arthritis   . DDD (degenerative disc disease), lumbar   . DDD (degenerative disc disease), cervical   . Gangrene of toe ([place]) Feb. 2015    Right  great    . Gastric ulcer     EGD 2014  . 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 Clotted renal dialysis arteriovenous graft ([place])   . CVA (cerebral infarction)   . ESRD (end stage renal disease) on dialysis [facility])    Past Surgical History  Procedure Laterality Date  . Coronary artery bypass graft  1996    [facility]  . Below knee leg amputation Bilateral   . Esophagogastroduodenoscopy Left 05/24/2012    [id number] dilated baggy but otherwise a normal/ Small hiatal hernia. Gastric ulcer -S/P biopsy  . Amputation Right 04/10/2013    Procedure: Right Below Knee Amputation;  Surgeon: Newt Minion, MD;  Location: [place];  Service: Orthopedics;  Laterality: Right;  Right Below Knee Amputation  . Appendectomy    . Peripheral vascular catheterization N/A 08/09/2014    Procedure: A/V Shuntogram/Fistulagram;  Surgeon:  Katha Cabal, MD;  Location: [place] [facility];  Service: Cardiovascular;  Laterality: N/A;  . Peripheral vascular catheterization Left 08/09/2014    Procedure: A/V Shunt Intervention;  Surgeon: Katha Cabal, MD;  Location: [place] [facility];  Service: Cardiovascular;  Laterality: Left;  . Cholecystectomy     Family History  Problem Relation Age of Onset  . Colon cancer Neg Hx   . Cancer Mother    Social History  Substance Use Topics  . Smoking status: Former Smoker    Quit date: 08/08/2004  . Smokeless tobacco: Never Used  . Alcohol Use: No    Review of Systems  Constitutional: Negative for appetite change and fatigue.  HENT: Negative for congestion, ear discharge and sinus pressure.   Eyes: Negative for discharge.  Respiratory: Negative for cough.   Cardiovascular: Negative for chest pain.  Gastrointestinal: Positive for vomiting. Negative for abdominal pain and diarrhea.  Genitourinary: Negative for frequency and hematuria.  Musculoskeletal: Negative for back pain.  Skin: Negative for rash.  Neurological: Positive for weakness. Negative for seizures and headaches.  Psychiatric/Behavioral: Negative for hallucinations.      Allergies  Codeine and Yellow jacket venom  Home Medications   Prior to Admission medications   Medication Sig Start Date End Date Taking? Authorizing Provider  Amino Acid Infusion (PROSOL) 20 % SOLN every Monday, Wednesday, and Friday.  10/27/14  Yes Historical Provider, MD  amLODipine (NORVASC) 5 MG  tablet Take 5 mg by mouth daily.   Yes Historical Provider, MD  aspirin EC 81 MG tablet Take 81 mg by mouth at bedtime.    Yes Historical Provider, MD  B Complex-C-Folic Acid (NEPHRO-VITE PO) Take 1 tablet by mouth every evening.    Yes Historical Provider, MD  furosemide (LASIX) 40 MG tablet TAKE 1/2 TABLET BY MOUTH EVERY EVENING 05/22/15  Yes Lendon Colonel, NP  LANTUS SOLOSTAR 100 UNIT/ML Solostar Pen Inject 8 Units into the skin every  evening.  12/21/13  Yes Historical Provider, MD  nitroGLYCERIN (NITROSTAT) 0.4 MG SL tablet Place 1 tablet (0.4 mg total) under the tongue every 5 (five) minutes as needed for chest pain. 11/18/14  Yes Samuella Cota, MD  omeprazole (PRILOSEC) 20 MG capsule Take 1 capsule (20 mg total) by mouth 2 (two) times daily before a meal. 06/17/13  Yes Radene Gunning, NP  acetaminophen (TYLENOL) 500 MG tablet Take 500 mg by mouth 2 (two) times daily.    Historical Provider, MD  atorvastatin (LIPITOR) 80 MG tablet Take 1 tablet (80 mg total) by mouth daily at 6 PM. Patient not taking: Reported on 05/12/2015 11/18/14   Samuella Cota, MD  lisinopril (PRINIVIL,ZESTRIL) 5 MG tablet Take 1 tablet (5 mg total) by mouth daily. Patient not taking: Reported on 08/12/2015 12/22/14   Lendon Colonel, NP   BP 143/73 mmHg  Pulse 81  Temp(Src) 97.6 F (36.4 C) (Oral)  Resp 22  Ht 5\' 9"  (1.753 m)  Wt 194 lb 0.1 oz (88 kg)  BMI 28.64 kg/m2  SpO2 97% Physical Exam  Constitutional: He is oriented to person, place, and time. He appears well-developed.  HENT:  Head: Normocephalic.  Eyes: Conjunctivae and EOM are normal. No scleral icterus.  Neck: Neck supple. No thyromegaly present.  Cardiovascular: Normal rate and regular rhythm.  Exam reveals no gallop and no friction rub.   No murmur heard. Pulmonary/Chest: No stridor. He has no wheezes. He has no rales. He exhibits no tenderness.  Abdominal: He exhibits no distension. There is no tenderness. There is no rebound.  Lymphadenopathy:    He has no cervical adenopathy.  Neurological: He is oriented to person, place, and time. He exhibits normal muscle tone. Coordination normal.  Skin: No rash noted. No erythema.  Psychiatric: He has a normal mood and affect. His behavior is normal.    ED Course  Procedures (including critical care time) Labs Review Labs Reviewed  CBC WITH DIFFERENTIAL/PLATELET - Abnormal; Notable for the following:    RBC 3.89 (*)     Hemoglobin 11.9 (*)    HCT 35.8 (*)    All other components within normal limits  COMPREHENSIVE METABOLIC PANEL - Abnormal; Notable for the following:    Chloride 96 (*)    Glucose, Bld 200 (*)    BUN 50 (*)    Creatinine, Ser 5.21 (*)    Calcium 7.8 (*)    Total Protein 6.0 (*)    Albumin 2.6 (*)    AST 12 (*)    ALT 12 (*)    Total Bilirubin 1.6 (*)    GFR calc non Af Amer 10 (*)    GFR calc Af Amer 11 (*)    All other components within normal limits  URINALYSIS, ROUTINE W REFLEX MICROSCOPIC (NOT AT [facility]) - Abnormal; Notable for the following:    Color, Urine AMBER (*)    APPearance CLOUDY (*)    Glucose, UA 100 (*)  Hgb urine dipstick LARGE (*)    Bilirubin Urine SMALL (*)    Ketones, ur TRACE (*)    Protein, ur >300 (*)    Nitrite POSITIVE (*)    Leukocytes, UA MODERATE (*)    All other components within normal limits  BRAIN NATRIURETIC PEPTIDE - Abnormal; Notable for the following:    B Natriuretic Peptide 114.0 (*)    All other components within normal limits  URINE MICROSCOPIC-ADD ON - Abnormal; Notable for the following:    Squamous Epithelial / LPF 0-5 (*)    Bacteria, UA MANY (*)    All other components within normal limits  CBG MONITORING, ED - Abnormal; Notable for the following:    Glucose-Capillary 191 (*)    All other components within normal limits  URINE CULTURE    Imaging Review Dg Chest Portable 1 View  08/12/2015  CLINICAL DATA:  Vomiting, weakness, pain with urination, cough EXAM: PORTABLE CHEST 1 VIEW COMPARISON:  05/12/2015 FINDINGS: Cardiomediastinal silhouette is stable. No acute infiltrate or pleural effusion. No pulmonary edema. Degenerative changes bilateral shoulders again noted. Dextroscoliosis lower thoracic spine. IMPRESSION: No active disease. Electronically Signed   By: Lahoma Crocker M.D.   On: 08/12/2015 17:29   I have personally reviewed and evaluated these images and lab results as part of my medical decision-making.   EKG  Interpretation None      MDM   Final diagnoses:  UTI (lower urinary tract infection)    Patient with urinary tract infection vomiting dialysis patient. He Eran be given Rocephin IV Zofran Nicholas be admitted for observation    Milton Ferguson, MD 08/12/15 1940

## 2015-08-12 NOTE — ED Notes (Signed)
Pain with urination,  Vomiting and weakness since yesterday.  Dialysis pt.  Had to come off machine an hour early yesterday.

## 2015-08-13 ENCOUNTER — Observation Stay (HOSPITAL_COMMUNITY): Payer: Medicare Other

## 2015-08-13 DIAGNOSIS — R112 Nausea with vomiting, unspecified: Secondary | ICD-10-CM

## 2015-08-13 DIAGNOSIS — I10 Essential (primary) hypertension: Secondary | ICD-10-CM

## 2015-08-13 DIAGNOSIS — N390 Urinary tract infection, site not specified: Secondary | ICD-10-CM | POA: Diagnosis not present

## 2015-08-13 DIAGNOSIS — N186 End stage renal disease: Secondary | ICD-10-CM

## 2015-08-13 DIAGNOSIS — E1159 Type 2 diabetes mellitus with other circulatory complications: Secondary | ICD-10-CM

## 2015-08-13 DIAGNOSIS — N12 Tubulo-interstitial nephritis, not specified as acute or chronic: Secondary | ICD-10-CM

## 2015-08-13 DIAGNOSIS — Z992 Dependence on renal dialysis: Secondary | ICD-10-CM

## 2015-08-13 LAB — GLUCOSE, CAPILLARY
GLUCOSE-CAPILLARY: 190 mg/dL — AB (ref 65–99)
GLUCOSE-CAPILLARY: 205 mg/dL — AB (ref 65–99)
GLUCOSE-CAPILLARY: 220 mg/dL — AB (ref 65–99)
GLUCOSE-CAPILLARY: 229 mg/dL — AB (ref 65–99)

## 2015-08-13 LAB — CBC
HEMATOCRIT: 34.9 % — AB (ref 39.0–52.0)
HEMATOCRIT: 33.5 % — AB (ref 39.0–52.0)
HEMOGLOBIN: 11.4 g/dL — AB (ref 13.0–17.0)
HEMOGLOBIN: 11 g/dL — AB (ref 13.0–17.0)
MCH: 30.1 pg (ref 26.0–34.0)
MCH: 30.6 pg (ref 26.0–34.0)
MCHC: 32.7 g/dL (ref 30.0–36.0)
MCHC: 32.8 g/dL (ref 30.0–36.0)
MCV: 92.1 fL (ref 78.0–100.0)
MCV: 93.1 fL (ref 78.0–100.0)
Platelets: 213 10*3/uL (ref 150–400)
Platelets: 210 10*3/uL (ref 150–400)
RBC: 3.79 MIL/uL — ABNORMAL LOW (ref 4.22–5.81)
RBC: 3.6 MIL/uL — AB (ref 4.22–5.81)
RDW: 14.1 % (ref 11.5–15.5)
RDW: 13.5 % (ref 11.5–15.5)
WBC: 8.6 10*3/uL (ref 4.0–10.5)
WBC: 8.1 10*3/uL (ref 4.0–10.5)

## 2015-08-13 LAB — RENAL FUNCTION PANEL
ALBUMIN: 2.5 g/dL — AB (ref 3.5–5.0)
ANION GAP: 11 (ref 5–15)
BUN: 62 mg/dL — ABNORMAL HIGH (ref 6–20)
CALCIUM: 7.8 mg/dL — AB (ref 8.9–10.3)
CO2: 26 mmol/L (ref 22–32)
Chloride: 100 mmol/L — ABNORMAL LOW (ref 101–111)
Creatinine, Ser: 5.89 mg/dL — ABNORMAL HIGH (ref 0.61–1.24)
GFR calc non Af Amer: 8 mL/min — ABNORMAL LOW (ref >60)
GFR, EST AFRICAN AMERICAN: 10 mL/min — AB (ref >60)
GLUCOSE: 223 mg/dL — AB (ref 65–99)
PHOSPHORUS: 5 mg/dL — AB (ref 2.5–4.6)
POTASSIUM: 3.4 mmol/L — AB (ref 3.5–5.1)
Sodium: 137 mmol/L (ref 135–145)

## 2015-08-13 LAB — BASIC METABOLIC PANEL
ANION GAP: 11 (ref 5–15)
BUN: 56 mg/dL — ABNORMAL HIGH (ref 6–20)
CO2: 27 mmol/L (ref 22–32)
Calcium: 7.8 mg/dL — ABNORMAL LOW (ref 8.9–10.3)
Chloride: 98 mmol/L — ABNORMAL LOW (ref 101–111)
Creatinine, Ser: 5.53 mg/dL — ABNORMAL HIGH (ref 0.61–1.24)
GFR calc Af Amer: 10 mL/min — ABNORMAL LOW (ref >60)
GFR calc non Af Amer: 9 mL/min — ABNORMAL LOW (ref >60)
GLUCOSE: 209 mg/dL — AB (ref 65–99)
POTASSIUM: 3.6 mmol/L (ref 3.5–5.1)
Sodium: 136 mmol/L (ref 135–145)

## 2015-08-13 LAB — LIPASE, BLOOD: LIPASE: 15 U/L (ref 11–51)

## 2015-08-13 MED ORDER — INSULIN ASPART 100 UNIT/ML ~~LOC~~ SOLN
0.0000 [IU] | Freq: Every day | SUBCUTANEOUS | Status: DC
  Administered 2015-08-13 – 2015-08-14 (×2): 2 [IU] via SUBCUTANEOUS

## 2015-08-13 MED ORDER — SODIUM CHLORIDE 0.9 % IV SOLN: 100.0000 mL | INTRAVENOUS | Status: DC | PRN

## 2015-08-13 MED ORDER — LIDOCAINE-PRILOCAINE 2.5-2.5 % EX CREA: 1.0000 "application " | TOPICAL_CREAM | CUTANEOUS | Status: DC | PRN

## 2015-08-13 MED ORDER — AMLODIPINE BESYLATE 5 MG PO TABS: 5.0000 mg | ORAL_TABLET | Freq: Every day | ORAL | Status: DC

## 2015-08-13 MED ORDER — FAMOTIDINE IN NACL 20-0.9 MG/50ML-% IV SOLN
20.0000 mg | Freq: Two times a day (BID) | INTRAVENOUS | Status: DC
Start: 2015-08-13 — End: 2015-08-14
  Administered 2015-08-13 – 2015-08-14 (×2): 20 mg via INTRAVENOUS
  Filled 2015-08-13 (×4): qty 50

## 2015-08-13 MED ORDER — PROMETHAZINE HCL 25 MG/ML IJ SOLN
6.2500 mg | Freq: Four times a day (QID) | INTRAMUSCULAR | Status: DC | PRN
  Administered 2015-08-14: 6.25 mg via INTRAVENOUS
  Filled 2015-08-13: qty 1

## 2015-08-13 MED ORDER — INSULIN ASPART 100 UNIT/ML ~~LOC~~ SOLN
0.0000 [IU] | Freq: Three times a day (TID) | SUBCUTANEOUS | Status: DC
  Administered 2015-08-13 (×2): 3 [IU] via SUBCUTANEOUS
  Administered 2015-08-13: 2 [IU] via SUBCUTANEOUS
  Administered 2015-08-14 (×2): 3 [IU] via SUBCUTANEOUS
  Administered 2015-08-15 (×2): 2 [IU] via SUBCUTANEOUS

## 2015-08-13 MED ORDER — LISINOPRIL 5 MG PO TABS
5.0000 mg | ORAL_TABLET | Freq: Every day | ORAL | Status: DC
  Administered 2015-08-15: 5 mg via ORAL
  Filled 2015-08-13: qty 1

## 2015-08-13 MED ORDER — ONDANSETRON HCL 4 MG/2ML IJ SOLN
4.0000 mg | Freq: Four times a day (QID) | INTRAMUSCULAR | Status: DC | PRN
  Administered 2015-08-13: 4 mg via INTRAVENOUS
  Filled 2015-08-13: qty 2

## 2015-08-13 MED ORDER — ALTEPLASE 2 MG IJ SOLR
2.0000 mg | Freq: Once | INTRAMUSCULAR | Status: DC | PRN
  Filled 2015-08-13: qty 2

## 2015-08-13 MED ORDER — LIDOCAINE HCL (PF) 1 % IJ SOLN: 5.0000 mL | INTRAMUSCULAR | Status: DC | PRN

## 2015-08-13 MED ORDER — ONDANSETRON HCL 4 MG/2ML IJ SOLN
4.0000 mg | Freq: Four times a day (QID) | INTRAMUSCULAR | Status: DC
  Administered 2015-08-13 – 2015-08-15 (×7): 4 mg via INTRAVENOUS
  Filled 2015-08-13 (×6): qty 2

## 2015-08-13 MED ORDER — PENTAFLUOROPROP-TETRAFLUOROETH EX AERO: 1.0000 "application " | INHALATION_SPRAY | CUTANEOUS | Status: DC | PRN

## 2015-08-13 MED ORDER — HEPARIN SODIUM (PORCINE) 1000 UNIT/ML DIALYSIS
1000.0000 [IU] | INTRAMUSCULAR | Status: DC | PRN
  Filled 2015-08-13: qty 1

## 2015-08-13 MED ORDER — NEPHRO-VITE 0.8 MG PO TABS
1.0000 | ORAL_TABLET | Freq: Every evening | ORAL | Status: DC
  Administered 2015-08-14: 1 via ORAL
  Filled 2015-08-13 (×3): qty 1

## 2015-08-13 MED ORDER — FAMOTIDINE IN NACL 20-0.9 MG/50ML-% IV SOLN
20.0000 mg | Freq: Once | INTRAVENOUS | Status: AC
  Administered 2015-08-13: 20 mg via INTRAVENOUS
  Filled 2015-08-13: qty 50

## 2015-08-13 MED ORDER — CLONIDINE HCL 0.1 MG/24HR TD PTWK
0.1000 mg | MEDICATED_PATCH | TRANSDERMAL | Status: DC
  Administered 2015-08-13: 0.1 mg via TRANSDERMAL
  Filled 2015-08-13: qty 1

## 2015-08-13 MED ORDER — HYDRALAZINE HCL 20 MG/ML IJ SOLN: 10.0000 mg | Freq: Four times a day (QID) | INTRAMUSCULAR | Status: DC | PRN

## 2015-08-13 MED ORDER — ACETAMINOPHEN 500 MG PO TABS
500.0000 mg | ORAL_TABLET | Freq: Four times a day (QID) | ORAL | Status: DC | PRN
  Administered 2015-08-13: 500 mg via ORAL
  Filled 2015-08-13: qty 1

## 2015-08-13 NOTE — Care Management Obs Status (Signed)
[place] NOTIFICATION   Patient Details  Name: Alan [name] MRN: [id number] Date of Birth: 12-04-1940   Medicare Observation Status Notification Given:  Yes    Briant Sites, RN 08/13/2015, 10:23 AM

## 2015-08-13 NOTE — Consult Note (Signed)
Reason for Consult: End-stage renal disease Referring Physician: Dr. Caryn Section  Alan [name] is an 75 y.o. male.  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 Patient also was complaining with some nausea and vomiting. He has some chills. He doesn't have any diarrhea. Presently denies any difficulty in breathing.  Past Medical History  Diagnosis Date  . Diabetes mellitus   . Hypertension   . Gout   . Coronary artery disease 2000    Stents [facility]  . Stroke ([place])   . Asthma   . Hypercholesteremia   . Arthritis   . DDD (degenerative disc disease), lumbar   . DDD (degenerative disc disease), cervical   . Gangrene of toe ([place]) Feb. 2015    Right  great    . Gastric ulcer     EGD 2014  . 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 Clotted renal dialysis arteriovenous graft ([place])   . CVA (cerebral infarction)   . ESRD (end stage renal disease) on dialysis [facility] [place])     Past Surgical History  Procedure Laterality Date  . Coronary artery bypass graft  1996    [facility]  . Below knee leg amputation Bilateral   . Esophagogastroduodenoscopy Left 05/24/2012    ASU:[id number] dilated baggy but otherwise a normal/ Small hiatal hernia. Gastric ulcer -S/P biopsy  . Amputation Right 04/10/2013    Procedure: Right Below Knee Amputation;  Surgeon: Newt Minion, MD;  Location: [place];  Service: Orthopedics;  Laterality: Right;  Right Below Knee Amputation  . Appendectomy    . Peripheral vascular catheterization N/A 08/09/2014    Procedure: A/V Shuntogram/Fistulagram;  Surgeon: Katha Cabal, MD;  Location: [place] [facility];  Service: Cardiovascular;  Laterality: N/A;  . Peripheral vascular catheterization Left 08/09/2014    Procedure: A/V Shunt Intervention;  Surgeon: Katha Cabal, MD;  Location: [place] [facility];  Service:  Cardiovascular;  Laterality: Left;  . Cholecystectomy      Family History  Problem Relation Age of Onset  . Colon cancer Neg Hx   . Cancer Mother     Social History:  reports that he quit smoking about 11 years ago. He has never used smokeless tobacco. He reports that he does not drink alcohol or use illicit drugs.  Allergies:  Allergies  Allergen Reactions  . Codeine Itching and Nausea And Vomiting  . Yellow Jacket Venom [Bee Venom] Swelling    Medications: I have reviewed the patient's current medications.  Results for orders placed or performed during the hospital encounter of 08/12/15 (from the past 48 hour(s))  CBG monitoring, ED     Status: Abnormal   Collection Time: 08/12/15  5:02 PM  Result Value Ref Range   Glucose-Capillary 191 (H) 65 - 99 mg/dL  Urinalysis, Routine w reflex microscopic (not at [facility])     Status: Abnormal   Collection Time: 08/12/15  5:15 PM  Result Value Ref Range   Color, Urine AMBER (A) YELLOW    Comment: BIOCHEMICALS MAY BE AFFECTED BY COLOR   APPearance CLOUDY (A) CLEAR   Specific Gravity, Urine 1.015 1.005 - 1.030   pH 6.0 5.0 - 8.0   Glucose, UA 100 (A) NEGATIVE mg/dL   Hgb urine dipstick LARGE (A) NEGATIVE   Bilirubin Urine SMALL (A) NEGATIVE   Ketones, ur TRACE (A) NEGATIVE mg/dL  Protein, ur >300 (A) NEGATIVE mg/dL   Nitrite POSITIVE (A) NEGATIVE   Leukocytes, UA MODERATE (A) NEGATIVE  Urine microscopic-add on     Status: Abnormal   Collection Time: 08/12/15  5:15 PM  Result Value Ref Range   Squamous Epithelial / LPF 0-5 (A) NONE SEEN   WBC, UA TOO NUMEROUS TO COUNT 0 - 5 WBC/hpf   RBC / HPF TOO NUMEROUS TO COUNT 0 - 5 RBC/hpf   Bacteria, UA MANY (A) NONE SEEN  CBC with Differential/Platelet     Status: Abnormal   Collection Time: 08/12/15  5:31 PM  Result Value Ref Range   WBC 8.9 4.0 - 10.5 K/uL   RBC 3.89 (L) 4.22 - 5.81 MIL/uL   Hemoglobin 11.9 (L) 13.0 - 17.0 g/dL   HCT 35.8 (L) 39.0 - 52.0 %   MCV 92.0 78.0 - 100.0 fL    MCH 30.6 26.0 - 34.0 pg   MCHC 33.2 30.0 - 36.0 g/dL   RDW 14.1 11.5 - 15.5 %   Platelets 215 150 - 400 K/uL   Neutrophils Relative % 81 %   Neutro Abs 7.2 1.7 - 7.7 K/uL   Lymphocytes Relative 15 %   Lymphs Abs 1.3 0.7 - 4.0 K/uL   Monocytes Relative 4 %   Monocytes Absolute 0.3 0.1 - 1.0 K/uL   Eosinophils Relative 0 %   Eosinophils Absolute 0.0 0.0 - 0.7 K/uL   Basophils Relative 0 %   Basophils Absolute 0.0 0.0 - 0.1 K/uL  Comprehensive metabolic panel     Status: Abnormal   Collection Time: 08/12/15  5:31 PM  Result Value Ref Range   Sodium 136 135 - 145 mmol/L   Potassium 3.9 3.5 - 5.1 mmol/L   Chloride 96 (L) 101 - 111 mmol/L   CO2 26 22 - 32 mmol/L   Glucose, Bld 200 (H) 65 - 99 mg/dL   BUN 50 (H) 6 - 20 mg/dL   Creatinine, Ser 5.21 (H) 0.61 - 1.24 mg/dL   Calcium 7.8 (L) 8.9 - 10.3 mg/dL   Total Protein 6.0 (L) 6.5 - 8.1 g/dL   Albumin 2.6 (L) 3.5 - 5.0 g/dL   AST 12 (L) 15 - 41 U/L   ALT 12 (L) 17 - 63 U/L   Alkaline Phosphatase 68 38 - 126 U/L   Total Bilirubin 1.6 (H) 0.3 - 1.2 mg/dL   GFR calc non Af Amer 10 (L) >60 mL/min   GFR calc Af Amer 11 (L) >60 mL/min    Comment: (NOTE) The eGFR has been calculated using the CKD EPI equation. This calculation has not been validated in all clinical situations. eGFR's persistently <60 mL/min signify possible Chronic Kidney Disease.    Anion gap 14 5 - 15  Brain natriuretic peptide     Status: Abnormal   Collection Time: 08/12/15  5:31 PM  Result Value Ref Range   B Natriuretic Peptide 114.0 (H) 0.0 - 100.0 pg/mL  MRSA PCR Screening     Status: None   Collection Time: 08/12/15  9:32 PM  Result Value Ref Range   MRSA by PCR NEGATIVE NEGATIVE    Comment:        The GeneXpert MRSA Assay (FDA approved for NASAL specimens only), is one component of a comprehensive MRSA colonization surveillance program. It is not intended to diagnose MRSA infection nor to guide or monitor treatment for MRSA infections.    Glucose, capillary     Status: Abnormal   Collection  Time: 08/12/15 10:12 PM  Result Value Ref Range   Glucose-Capillary 209 (H) 65 - 99 mg/dL   Comment 1 Notify RN    Comment 2 Document in Chart   Basic metabolic panel     Status: Abnormal   Collection Time: 08/13/15  4:49 AM  Result Value Ref Range   Sodium 136 135 - 145 mmol/L   Potassium 3.6 3.5 - 5.1 mmol/L   Chloride 98 (L) 101 - 111 mmol/L   CO2 27 22 - 32 mmol/L   Glucose, Bld 209 (H) 65 - 99 mg/dL   BUN 56 (H) 6 - 20 mg/dL   Creatinine, Ser 5.53 (H) 0.61 - 1.24 mg/dL   Calcium 7.8 (L) 8.9 - 10.3 mg/dL   GFR calc non Af Amer 9 (L) >60 mL/min   GFR calc Af Amer 10 (L) >60 mL/min    Comment: (NOTE) The eGFR has been calculated using the CKD EPI equation. This calculation has not been validated in all clinical situations. eGFR's persistently <60 mL/min signify possible Chronic Kidney Disease.    Anion gap 11 5 - 15  CBC     Status: Abnormal   Collection Time: 08/13/15  4:49 AM  Result Value Ref Range   WBC 8.6 4.0 - 10.5 K/uL   RBC 3.79 (L) 4.22 - 5.81 MIL/uL   Hemoglobin 11.4 (L) 13.0 - 17.0 g/dL   HCT 34.9 (L) 39.0 - 52.0 %   MCV 92.1 78.0 - 100.0 fL   MCH 30.1 26.0 - 34.0 pg   MCHC 32.7 30.0 - 36.0 g/dL   RDW 14.1 11.5 - 15.5 %   Platelets 213 150 - 400 K/uL  Glucose, capillary     Status: Abnormal   Collection Time: 08/13/15  7:33 AM  Result Value Ref Range   Glucose-Capillary 190 (H) 65 - 99 mg/dL    Dg Chest Portable 1 View  08/12/2015  CLINICAL DATA:  Vomiting, weakness, pain with urination, cough EXAM: PORTABLE CHEST 1 VIEW COMPARISON:  05/12/2015 FINDINGS: Cardiomediastinal silhouette is stable. No acute infiltrate or pleural effusion. No pulmonary edema. Degenerative changes bilateral shoulders again noted. Dextroscoliosis lower thoracic spine. IMPRESSION: No active disease. Electronically Signed   By: Lahoma Crocker M.D.   On: 08/12/2015 17:29    Review of Systems  Constitutional: Positive for fever and  chills.  Respiratory: Negative for shortness of breath.   Cardiovascular: Negative for chest pain and orthopnea.  Gastrointestinal: Positive for nausea and vomiting. Negative for abdominal pain and diarrhea.  Genitourinary: Positive for dysuria and urgency.  Neurological: Positive for weakness.   Blood pressure 170/60, pulse 79, temperature 98.3 F (36.8 C), temperature source Oral, resp. rate 16, height 5' 9"  (1.753 m), weight 69.491 kg (153 lb 3.2 oz), SpO2 97 %. Physical Exam  Assessment/Plan: Problem #1 possible UTI. Presently patient is afebrile, normal white cell count and asymptomatic. Problem #2 end-stage renal disease: He is status post hemodialysis on Friday. Patient presently doesn't have any uremic signs and symptoms and his potassium is normal. Problem #3 anemia: His hemoglobin is within target goal Problem #4 history of diabetes Problem #5. Metabolic bone disease: His calcium is range. Problem #6 history of hypertension: Presently his blood pressure is reasonably controlled. Problem #7 history of CVA Problem #9 history of peripheral vascular disease: Status post bilateral BKA Plan: Patient does require dialysis today His dialysis [facility] be tomorrow which is his regular schedule.  Aislynn Cifelli S 08/13/2015, 8:26 AM

## 2015-08-13 NOTE — Progress Notes (Signed)
Pt c/o nausea and vomiting with breakfast.  Per Dr Caryn Section, PO medicines not given for AM.  PRN Zofran IV given at this time.  Robertlee continue to monitor pt.

## 2015-08-13 NOTE — Progress Notes (Addendum)
PROGRESS NOTE    Alan [name]  [id number] DOB: Mar 19, 1940 DOA: 08/12/2015 PCP: PROVIDER NOT IN SYSTEM [facility]   Brief Narrative: Patient is a 75 year old man with a history of CAD, status post CABG, ESRD, HTN, DM-2, who presented to the ED on 08/12/2015 with a chief complaint of painful urination, nausea, and vomiting. In the ED, he was afebrile and hemodynamically stable. His lab data revealed positive nitrite, many bacteria, and too numerous to count WBCs. Lab data were significant for mildly elevated bilirubin, normal liver transaminases, glucose of 200, BNP of 114, creatinine of 5.21, BUN of 50, and normal WBC. He was admitted for further management.  Assessment & Plan:   Principal Problem:   Pyelonephritis Active Problems:   Nausea and vomiting   CAD (coronary artery disease)   DM type 2 causing vascular disease (HCC)   ESRD on dialysis ([place])   Essential hypertension   1. Acute pyelonephritis. Urine culture was ordered. Patient was started on IV Rocephin. He was started on gentle IV fluids. Zofran was ordered as needed for nausea. He feels better, but still has some GI symptoms.  Nausea and vomiting. Patient denies abdominal pain and has no abdominal tenderness on exam. His GI symptoms are presumed to be secondary to acute pyelonephritis. His liver transaminases were within normal limits. However for further evaluation: -We'll all other lipase and abdominal x-ray. -We'll downgrade his diet to full liquids as tolerated. -We'll change Zofran to scheduled every 6 hours. We'll hold oral medications this morning. Gale give Pepcid IV every 12 hours.  CAD and hypertension. Patient denies chest pain. He is supposed be treated with amlodipine, but he had not taken it in several days; Lipitor, but he stopped taking it due to muscle weakness; lisinopril, but he had not been taking it, sublingual nitroglycerin as needed, and aspirin. -Due to his nausea and vomiting, Leavy  hold these medications. -We'll start clonidine transdermal and as needed hydralazine for treatment of his HTN.  Diabetes mellitus, insulin requiring. Patient is treated with Lantus chronically. It was held on admission. Sliding scale NovoLog started. We'll continue to monitor for adjustments in insulin dosing. Bobbie order hemoglobin A1c.     DVT prophylaxis: Heparin Code Status: Full code Family Communication: Family not available Disposition Plan: Home   Consultants:   Nephrology  Procedures:  Hemodialysis, pending  Antimicrobials:   Rocephin 08/12/15    Subjective: Patient attempted to be breakfast, but became nauseated and had a small amount of emesis. He denies abdominal pain.  Objective: Filed Vitals:   08/12/15 2000 08/12/15 2030 08/12/15 2134 08/13/15 0703  BP: 142/63 147/62 139/60 170/60  Pulse:   77 79  Temp:   98.1 F (36.7 C) 98.3 F (36.8 C)  TempSrc:   Oral Oral  Resp: 25 22 20 16   Height:      Weight:   68.947 kg (152 lb) 69.491 kg (153 lb 3.2 oz)  SpO2:   96% 97%    Intake/Output Summary (Last 24 hours) at 08/13/15 1037 Last data filed at 08/13/15 0541  Gross per 24 hour  Intake      0 ml  Output    175 ml  Net   -175 ml   Filed Weights   08/12/15 1659 08/12/15 2134 08/13/15 0703  Weight: 88 kg (194 lb 0.1 oz) 68.947 kg (152 lb) 69.491 kg (153 lb 3.2 oz)    Examination:  General exam: 75 year old man who appears ill and complaining of nausea.  Respiratory system:  Clear to auscultation. Respiratory effort normal. Cardiovascular system: S1 & S2 with soft systolic murmur.. No edema bilateral lower extremity stumps. Gastrointestinal system: Abdomen is nondistended, soft and nontender. No organomegaly or masses felt. Normal bowel sounds heard. Central nervous system: Alert and oriented. No focal neurological deficits. Extremities:Bilateral lower extremity amputee Skin: No rashes, lesions or ulcers Psychiatry: Judgement and insight appear  normal. Mood & affect appropriate, but he appears ill.     Data Reviewed: I have personally reviewed following labs and imaging studies  CBC:  Recent Labs Lab 08/12/15 1731 08/13/15 0449  WBC 8.9 8.6  NEUTROABS 7.2  --   HGB 11.9* 11.4*  HCT 35.8* 34.9*  MCV 92.0 92.1  PLT 215 [id number]   Basic Metabolic Panel:  Recent Labs Lab 08/12/15 1731 08/13/15 0449  NA 136 136  K 3.9 3.6  CL 96* 98*  CO2 26 27  GLUCOSE 200* 209*  BUN 50* 56*  CREATININE 5.21* 5.53*  CALCIUM 7.8* 7.8*   GFR: Estimated Creatinine Clearance: 11.2 mL/min (by C-G formula based on Cr of 5.53). Liver Function Tests:  Recent Labs Lab 08/12/15 1731  AST 12*  ALT 12*  ALKPHOS 68  BILITOT 1.6*  PROT 6.0*  ALBUMIN 2.6*   No results for input(s): LIPASE, AMYLASE in the last 168 hours. No results for input(s): AMMONIA in the last 168 hours. Coagulation Profile: No results for input(s): INR, PROTIME in the last 168 hours. Cardiac Enzymes: No results for input(s): CKTOTAL, CKMB, CKMBINDEX, TROPONINI in the last 168 hours. BNP (last 3 results) No results for input(s): PROBNP in the last 8760 hours. HbA1C: No results for input(s): HGBA1C in the last 72 hours. CBG:  Recent Labs Lab 08/12/15 1702 08/12/15 2212 08/13/15 0733  GLUCAP 191* 209* 190*   Lipid Profile: No results for input(s): CHOL, HDL, LDLCALC, TRIG, CHOLHDL, LDLDIRECT in the last 72 hours. Thyroid Function Tests: No results for input(s): TSH, T4TOTAL, FREET4, T3FREE, THYROIDAB in the last 72 hours. Anemia Panel: No results for input(s): VITAMINB12, FOLATE, FERRITIN, TIBC, IRON, RETICCTPCT in the last 72 hours. Sepsis Labs: No results for input(s): PROCALCITON, LATICACIDVEN in the last 168 hours.  Recent Results (from the past 240 hour(s))  MRSA PCR Screening     Status: None   Collection Time: 08/12/15  9:32 PM  Result Value Ref Range Status   MRSA by PCR NEGATIVE NEGATIVE Final    Comment:        The GeneXpert MRSA Assay  (FDA approved for NASAL specimens only), is one component of a comprehensive MRSA colonization surveillance program. It is not intended to diagnose MRSA infection nor to guide or monitor treatment for MRSA infections.          Radiology Studies: Dg Chest Portable 1 View  08/12/2015  CLINICAL DATA:  Vomiting, weakness, pain with urination, cough EXAM: PORTABLE CHEST 1 VIEW COMPARISON:  05/12/2015 FINDINGS: Cardiomediastinal silhouette is stable. No acute infiltrate or pleural effusion. No pulmonary edema. Degenerative changes bilateral shoulders again noted. Dextroscoliosis lower thoracic spine. IMPRESSION: No active disease. Electronically Signed   By: Lahoma Crocker M.D.   On: 08/12/2015 17:29        Scheduled Meds: . acetaminophen  500 mg Oral BID  . amLODipine  5 mg Oral Daily  . aspirin EC  81 mg Oral QHS  . b complex-vitamin c-folic acid  1 tablet Oral QPM  . cefTRIAXone (ROCEPHIN)  IV  1 g Intravenous Q24H  . heparin  5,000 Units Subcutaneous Q8H  .  insulin aspart  0-5 Units Subcutaneous QHS  . insulin aspart  0-9 Units Subcutaneous TID WC  . lisinopril  5 mg Oral Daily  . pantoprazole  40 mg Oral Daily   Continuous Infusions: . dextrose 5 % and 0.9% NaCl 50 mL/hr at 08/12/15 2304        Time spent: 35 minutes    Rexene Alberts, MD [facility] Pager [phone]  If 7PM-7AM, please contact night-coverage [email or web address] Password [facility] 08/13/2015, 10:37 AM

## 2015-08-14 DIAGNOSIS — I10 Essential (primary) hypertension: Secondary | ICD-10-CM | POA: Diagnosis not present

## 2015-08-14 DIAGNOSIS — E1159 Type 2 diabetes mellitus with other circulatory complications: Secondary | ICD-10-CM | POA: Diagnosis not present

## 2015-08-14 DIAGNOSIS — N186 End stage renal disease: Secondary | ICD-10-CM | POA: Diagnosis not present

## 2015-08-14 DIAGNOSIS — N12 Tubulo-interstitial nephritis, not specified as acute or chronic: Secondary | ICD-10-CM | POA: Diagnosis not present

## 2015-08-14 LAB — RENAL FUNCTION PANEL
ALBUMIN: 2.4 g/dL — AB (ref 3.5–5.0)
ANION GAP: 10 (ref 5–15)
BUN: 63 mg/dL — AB (ref 6–20)
CHLORIDE: 100 mmol/L — AB (ref 101–111)
CO2: 27 mmol/L (ref 22–32)
Calcium: 7.8 mg/dL — ABNORMAL LOW (ref 8.9–10.3)
Creatinine, Ser: 6.08 mg/dL — ABNORMAL HIGH (ref 0.61–1.24)
GFR, EST AFRICAN AMERICAN: 9 mL/min — AB (ref >60)
GFR, EST NON AFRICAN AMERICAN: 8 mL/min — AB (ref >60)
Glucose, Bld: 194 mg/dL — ABNORMAL HIGH (ref 65–99)
PHOSPHORUS: 5.5 mg/dL — AB (ref 2.5–4.6)
POTASSIUM: 3.4 mmol/L — AB (ref 3.5–5.1)
Sodium: 137 mmol/L (ref 135–145)

## 2015-08-14 LAB — CBC
HEMATOCRIT: 34.6 % — AB (ref 39.0–52.0)
Hemoglobin: 11.3 g/dL — ABNORMAL LOW (ref 13.0–17.0)
MCH: 30.5 pg (ref 26.0–34.0)
MCHC: 32.7 g/dL (ref 30.0–36.0)
MCV: 93.3 fL (ref 78.0–100.0)
Platelets: 216 10*3/uL (ref 150–400)
RBC: 3.71 MIL/uL — AB (ref 4.22–5.81)
RDW: 13.5 % (ref 11.5–15.5)
WBC: 7 10*3/uL (ref 4.0–10.5)

## 2015-08-14 LAB — GLUCOSE, CAPILLARY
GLUCOSE-CAPILLARY: 242 mg/dL — AB (ref 65–99)
Glucose-Capillary: 238 mg/dL — ABNORMAL HIGH (ref 65–99)
Glucose-Capillary: 220 mg/dL — ABNORMAL HIGH (ref 65–99)

## 2015-08-14 LAB — HEPATITIS B SURFACE ANTIGEN: Hepatitis B Surface Ag: NEGATIVE

## 2015-08-14 LAB — IRON AND TIBC
IRON: 121 ug/dL (ref 45–182)
Saturation Ratios: 60 % — ABNORMAL HIGH (ref 17.9–39.5)
TIBC: 203 ug/dL — AB (ref 250–450)
UIBC: 82 ug/dL

## 2015-08-14 LAB — VITAMIN B12: VITAMIN B 12: 1336 pg/mL — AB (ref 180–914)

## 2015-08-14 LAB — TSH: TSH: 0.281 u[IU]/mL — ABNORMAL LOW (ref 0.350–4.500)

## 2015-08-14 MED ORDER — CALCIUM ACETATE (PHOS BINDER) 667 MG PO CAPS
667.0000 mg | ORAL_CAPSULE | Freq: Three times a day (TID) | ORAL | Status: DC
  Administered 2015-08-14 – 2015-08-15 (×3): 667 mg via ORAL
  Filled 2015-08-14 (×4): qty 1

## 2015-08-14 MED ORDER — AMLODIPINE BESYLATE 5 MG PO TABS
10.0000 mg | ORAL_TABLET | Freq: Every day | ORAL | Status: DC
  Administered 2015-08-15: 10 mg via ORAL
  Filled 2015-08-14: qty 2

## 2015-08-14 MED ORDER — FAMOTIDINE 20 MG PO TABS
20.0000 mg | ORAL_TABLET | Freq: Every day | ORAL | Status: DC
  Administered 2015-08-15: 20 mg via ORAL
  Filled 2015-08-14: qty 1

## 2015-08-14 MED ORDER — INSULIN GLARGINE 100 UNIT/ML ~~LOC~~ SOLN
5.0000 [IU] | Freq: Every day | SUBCUTANEOUS | Status: DC
Start: 2015-08-14 — End: 2015-08-15
  Administered 2015-08-14: 5 [IU] via SUBCUTANEOUS
  Filled 2015-08-14 (×2): qty 0.05

## 2015-08-14 NOTE — Procedures (Signed)
   HEMODIALYSIS TREATMENT NOTE:  3 hour heparin-free dialysis completed via left forearm AVF (15g/antegrade). Goal met: 2 liters removed without interruption in ultrafiltration. All blood was returned and hemostasis was achieved within 10 minutes. Report given to Gershon Crane, RN.  Rockwell Alexandria, RN, CDN

## 2015-08-14 NOTE — Progress Notes (Addendum)
PROGRESS NOTE    Alan [name]  [id number] DOB: 01-18-1941 DOA: 08/12/2015 PCP: PROVIDER NOT IN SYSTEM [facility]   Brief Narrative: Patient is a 75 year old man with a history of CAD, status post CABG, ESRD, HTN, DM-2, who presented to the ED on 08/12/2015 with a chief complaint of painful urination, nausea, and vomiting. In the ED, he was afebrile and hemodynamically stable. His lab data revealed positive nitrite, many bacteria, and too numerous to count WBCs. Lab data were significant for mildly elevated bilirubin, normal liver transaminases, glucose of 200, BNP of 114, creatinine of 5.21, BUN of 50, and normal WBC. He was admitted for further management.  Assessment & Plan:   Principal Problem:   Pyelonephritis Active Problems:   Nausea and vomiting   CAD (coronary artery disease)   DM type 2 causing vascular disease (HCC)   ESRD on dialysis ([place])   Essential hypertension   1. Acute pyelonephritis. Urine culture was ordered. Patient was started on IV Rocephin. He was started on gentle IV fluids. Zofran was ordered for nausea. He feels better and denies dysuria. Urine culture is in process.  Nausea and vomiting. Patient denied abdominal pain and had no abdominal tenderness on exam. His GI symptoms were presumed to be secondary to acute pyelonephritis. His liver transaminases were within normal limits. Abdominal x-ray was unremarkable. Lipase was within normal limits. His diet was downgraded. Pepcid was given every 12 hours. Zofran was scheduled every 6 hours. -Patient is symptomatically improved. We'll change Zofran to as needed and Pepcid to by mouth. We'll advance his diet to solid foods.  CAD and hypertension. Patient denies chest pain. He is supposed be treated with amlodipine, but he had not taken it in several days; Lipitor, but he stopped taking it due to muscle weakness; lisinopril, but he had not been taking it, sublingual nitroglycerin as needed, and  aspirin. -Oral medications were initially held. Transdermal clonidine and as needed hydralazine were started to treat his hypertension. -Now that his GI symptoms have resolved, Prabhav resume Norvasc and lisinopril. Continue hydralazine when necessary. Discontinue clonidine.  Diabetes mellitus, insulin requiring. Patient is treated with Lantus chronically. It was held on admission. Sliding scale NovoLog was started. His A1c was only 5.2.   We'll start Lantus at 5 units daily at bedtime for CBGs greater than 200. We'll adjust Lantus in the setting of ESRD as needed to avoid wide swings in his CBGs.  Abnormal thyroid function tests. TSH was mildly low at 0.28. We'll order a free T4 for evaluation.  Normocytic anemia, likely due to end-stage renal disease. Total iron and B12 ordered for evaluation.  DVT prophylaxis: Heparin Code Status: Full code Family Communication: Family not available Disposition Plan: Home   Consultants:   Nephrology  Procedures:  Hemodialysis, pending  Antimicrobials:   Rocephin 08/12/15>>    Subjective: Patient says he feels much better. No complaints of nausea vomiting this morning. He denies abdominal pain.  Objective: Filed Vitals:   08/13/15 1433 08/13/15 2242 08/14/15 0603 08/14/15 0920  BP: 162/66 155/64 182/72   Pulse: 72 71 78   Temp: 97.7 F (36.5 C) 98.5 F (36.9 C) 97.8 F (36.6 C)   TempSrc: Oral Oral Oral   Resp: 18 20 20    Height:      Weight:   69.491 kg (153 lb 3.2 oz)   SpO2: 98% 96% 77% 98%    Intake/Output Summary (Last 24 hours) at 08/14/15 [id number] Last data filed at 08/14/15 0457  Gross per  24 hour  Intake 1066.67 ml  Output    625 ml  Net 441.67 ml   Filed Weights   08/12/15 2134 08/13/15 0703 08/14/15 0603  Weight: 68.947 kg (152 lb) 69.491 kg (153 lb 3.2 oz) 69.491 kg (153 lb 3.2 oz)    Examination:  General exam: 75 year old man who looks better this morning.  Respiratory system: Clear to auscultation; decreased  breath sounds in the bases. Respiratory effort normal. Cardiovascular system: S1 & S2 with soft systolic murmur.. No edema bilateral lower extremity stumps. Gastrointestinal system: Abdomen is nondistended, soft and nontender. No organomegaly or masses felt. Normal bowel sounds heard. Central nervous system: Alert and oriented. No focal neurological deficits. Extremities:Bilateral lower extremity amputee Skin: No rashes, lesions or ulcers Psychiatry: Judgement and insight appear normal.    Data Reviewed: I have personally reviewed following labs and imaging studies  CBC:  Recent Labs Lab 08/12/15 1731 08/13/15 0449 08/13/15 2010 08/14/15 0619  WBC 8.9 8.6 8.1 7.0  NEUTROABS 7.2  --   --   --   HGB 11.9* 11.4* 11.0* 11.3*  HCT 35.8* 34.9* 33.5* 34.6*  MCV 92.0 92.1 93.1 93.3  PLT 215 213 210 [id number]   Basic Metabolic Panel:  Recent Labs Lab 08/12/15 1731 08/13/15 0449 08/13/15 2010 08/14/15 0619  NA 136 136 137 137  K 3.9 3.6 3.4* 3.4*  CL 96* 98* 100* 100*  CO2 26 27 26 27   GLUCOSE 200* 209* 223* 194*  BUN 50* 56* 62* 63*  CREATININE 5.21* 5.53* 5.89* 6.08*  CALCIUM 7.8* 7.8* 7.8* 7.8*  PHOS  --   --  5.0* 5.5*   GFR: Estimated Creatinine Clearance: 10.2 mL/min (by C-G formula based on Cr of 6.08). Liver Function Tests:  Recent Labs Lab 08/12/15 1731 08/13/15 2010 08/14/15 0619  AST 12*  --   --   ALT 12*  --   --   ALKPHOS 68  --   --   BILITOT 1.6*  --   --   PROT 6.0*  --   --   ALBUMIN 2.6* 2.5* 2.4*    Recent Labs Lab 08/13/15 0449  LIPASE 15   No results for input(s): AMMONIA in the last 168 hours. Coagulation Profile: No results for input(s): INR, PROTIME in the last 168 hours. Cardiac Enzymes: No results for input(s): CKTOTAL, CKMB, CKMBINDEX, TROPONINI in the last 168 hours. BNP (last 3 results) No results for input(s): PROBNP in the last 8760 hours. HbA1C: No results for input(s): HGBA1C in the last 72 hours. CBG:  Recent Labs Lab  08/13/15 0733 08/13/15 1145 08/13/15 1555 08/13/15 2247 08/14/15 0811  GLUCAP 190* 205* 220* 229* 238*   Lipid Profile: No results for input(s): CHOL, HDL, LDLCALC, TRIG, CHOLHDL, LDLDIRECT in the last 72 hours. Thyroid Function Tests:  Recent Labs  08/14/15 0619  TSH 0.281*   Anemia Panel: No results for input(s): VITAMINB12, FOLATE, FERRITIN, TIBC, IRON, RETICCTPCT in the last 72 hours. Sepsis Labs: No results for input(s): PROCALCITON, LATICACIDVEN in the last 168 hours.  Recent Results (from the past 240 hour(s))  MRSA PCR Screening     Status: None   Collection Time: 08/12/15  9:32 PM  Result Value Ref Range Status   MRSA by PCR NEGATIVE NEGATIVE Final    Comment:        The GeneXpert MRSA Assay (FDA approved for NASAL specimens only), is one component of a comprehensive MRSA colonization surveillance program. It is not intended to diagnose MRSA infection  nor to guide or monitor treatment for MRSA infections.          Radiology Studies: Dg Abd 1 View  08/13/2015  CLINICAL DATA:  Nausea and vomiting. EXAM: ABDOMEN - 1 VIEW COMPARISON:  CT of the abdomen and pelvis on 04/12/2015 FINDINGS: No evidence of bowel obstruction or significant ileus. Advanced spondylosis of the lumbar spine present with associated leftward convex scoliosis. There likely is a small left pleural effusion. Advanced degenerative disease of the right hip joint. IMPRESSION: Unremarkable bowel gas pattern. Probable small left pleural effusion. Electronically Signed   By: Aletta Edouard M.D.   On: 08/13/2015 11:40   Dg Chest Portable 1 View  08/12/2015  CLINICAL DATA:  Vomiting, weakness, pain with urination, cough EXAM: PORTABLE CHEST 1 VIEW COMPARISON:  05/12/2015 FINDINGS: Cardiomediastinal silhouette is stable. No acute infiltrate or pleural effusion. No pulmonary edema. Degenerative changes bilateral shoulders again noted. Dextroscoliosis lower thoracic spine. IMPRESSION: No active disease.  Electronically Signed   By: Lahoma Crocker M.D.   On: 08/12/2015 17:29        Scheduled Meds: . amLODipine  10 mg Oral Daily  . aspirin EC  81 mg Oral QHS  . b complex-vitamin c-folic acid  1 tablet Oral QPM  . calcium acetate  667 mg Oral TID WC  . cefTRIAXone (ROCEPHIN)  IV  1 g Intravenous Q24H  . cloNIDine  0.1 mg Transdermal Q Sun  . famotidine (PEPCID) IV  20 mg Intravenous Q12H  . heparin  5,000 Units Subcutaneous Q8H  . insulin aspart  0-5 Units Subcutaneous QHS  . insulin aspart  0-9 Units Subcutaneous TID WC  . lisinopril  5 mg Oral Daily  . ondansetron (ZOFRAN) IV  4 mg Intravenous Q6H   Continuous Infusions: . dextrose 5 % and 0.9% NaCl 50 mL/hr at 08/13/15 1800        Time spent: 30 minutes    Rexene Alberts, MD [facility] Pager [phone]  If 7PM-7AM, please contact night-coverage [email or web address] Password [email or web address] 08/14/2015, 9:22 AM

## 2015-08-14 NOTE — Progress Notes (Signed)
Subjective: Interval History: has no complaint of difficulty in breathing. Patient had some nausea and abdominal pain yesterday but is feeling much better..  Objective: Vital signs in last 24 hours: Temp:  [97.7 F (36.5 C)-98.5 F (36.9 C)] 97.8 F (36.6 C) (06/26 0603) Pulse Rate:  [71-78] 78 (06/26 0603) Resp:  [18-20] 20 (06/26 0603) BP: (155-182)/(64-72) 182/72 mmHg (06/26 0603) SpO2:  [77 %-98 %] 77 % (06/26 0603) Weight:  [69.491 kg (153 lb 3.2 oz)] 69.491 kg (153 lb 3.2 oz) (06/26 0603) Weight change: -18.509 kg (-40 lb 12.9 oz)  Intake/Output from previous day: 06/25 0701 - 06/26 0700 In: 1236.7 [P.O.:240; I.V.:946.7; IV Piggyback:50] Out: 625 [Urine:625] Intake/Output this shift:    General appearance: alert, cooperative and no distress Resp: clear to auscultation bilaterally Cardio: regular rate and rhythm, S1, S2 normal, no murmur, click, rub or gallop GI: soft, non-tender; bowel sounds normal; no masses,  no organomegaly Extremities: No edema  Lab Results:  Recent Labs  08/13/15 2010 08/14/15 0619  WBC 8.1 7.0  HGB 11.0* 11.3*  HCT 33.5* 34.6*  PLT 210 216   BMET:  Recent Labs  08/13/15 2010 08/14/15 0619  NA 137 137  K 3.4* 3.4*  CL 100* 100*  CO2 26 27  GLUCOSE 223* 194*  BUN 62* 63*  CREATININE 5.89* 6.08*  CALCIUM 7.8* 7.8*   No results for input(s): PTH in the last 72 hours. Iron Studies: No results for input(s): IRON, TIBC, TRANSFERRIN, FERRITIN in the last 72 hours.  Studies/Results: Dg Abd 1 View  08/13/2015  CLINICAL DATA:  Nausea and vomiting. EXAM: ABDOMEN - 1 VIEW COMPARISON:  CT of the abdomen and pelvis on 04/12/2015 FINDINGS: No evidence of bowel obstruction or significant ileus. Advanced spondylosis of the lumbar spine present with associated leftward convex scoliosis. There likely is a small left pleural effusion. Advanced degenerative disease of the right hip joint. IMPRESSION: Unremarkable bowel gas pattern. Probable small  left pleural effusion. Electronically Signed   By: Aletta Edouard M.D.   On: 08/13/2015 11:40   Dg Chest Portable 1 View  08/12/2015  CLINICAL DATA:  Vomiting, weakness, pain with urination, cough EXAM: PORTABLE CHEST 1 VIEW COMPARISON:  05/12/2015 FINDINGS: Cardiomediastinal silhouette is stable. No acute infiltrate or pleural effusion. No pulmonary edema. Degenerative changes bilateral shoulders again noted. Dextroscoliosis lower thoracic spine. IMPRESSION: No active disease. Electronically Signed   By: Lahoma Crocker M.D.   On: 08/12/2015 17:29    I have reviewed the patient's current medications.  Assessment/Plan: Problem #1 end-stage renal disease: Presently patient is asymptomatic. His potassium slightly low. Problem #2 hypertension: His blood pressure is fluctuating but stable Problem# 3 anemia: His hemoglobin is without target goal. Problem #4 possible UTI: Presently is a febrile. Culture no growth. Problem #5 metabolic bone disease: Calcium is range but his phosphorus is slightly high. Problem #6 diabetes Problem# 7 coronary artery disease: Presently doesn't have any chest pain Plan: 1]We'll make arrangement to the patient to get dialysis today 2]Patient on PhosLo 1 tablet by mouth 3 times a day with meals.     Alan [name] S 08/14/2015,7:50 AM

## 2015-08-14 NOTE — Care Management Note (Signed)
Case Management Note  Patient Details  Name: Alan [name] MRN: [id number] Date of Birth: November 04, 1940  Subjective/Objective:                  Pt admitted with pyelonephritis. Pt is from home, lives iwt his wife and requires some assistance with ADL's at home. Pt states he has a regular bed but is WC bound and uses a lift to get in and out of bed. Pt receives HD MWF at [facility] in [place]. Pt has transportation to appointments. Pt plans to return home with self/family care.   Action/Plan: Anticipate DC home tomorrow. No CM needs anticipated.   Expected Discharge Date:    08/15/2015              Expected Discharge Plan:  Home/Self Care  In-House Referral:  NA  Discharge planning Services  CM Consult  Post Acute Care Choice:  NA Choice offered to:  NA  DME Arranged:    DME Agency:     HH Arranged:    HH Agency:     Status of Service:  Completed, signed off  If discussed at [employer] of Stay Meetings, dates discussed:    Additional Comments:  Sherald Barge, RN 08/14/2015, 10:36 AM

## 2015-08-15 ENCOUNTER — Encounter (HOSPITAL_COMMUNITY): Payer: Self-pay | Admitting: Internal Medicine

## 2015-08-15 DIAGNOSIS — N12 Tubulo-interstitial nephritis, not specified as acute or chronic: Secondary | ICD-10-CM | POA: Diagnosis not present

## 2015-08-15 DIAGNOSIS — E1159 Type 2 diabetes mellitus with other circulatory complications: Secondary | ICD-10-CM | POA: Diagnosis not present

## 2015-08-15 DIAGNOSIS — N390 Urinary tract infection, site not specified: Secondary | ICD-10-CM

## 2015-08-15 DIAGNOSIS — N186 End stage renal disease: Secondary | ICD-10-CM | POA: Diagnosis not present

## 2015-08-15 DIAGNOSIS — E0590 Thyrotoxicosis, unspecified without thyrotoxic crisis or storm: Secondary | ICD-10-CM

## 2015-08-15 HISTORY — DX: Thyrotoxicosis, unspecified without thyrotoxic crisis or storm: E05.90

## 2015-08-15 LAB — CBC
HCT: 31.7 % — ABNORMAL LOW (ref 39.0–52.0)
Hemoglobin: 10.7 g/dL — ABNORMAL LOW (ref 13.0–17.0)
MCH: 31.1 pg (ref 26.0–34.0)
MCHC: 33.8 g/dL (ref 30.0–36.0)
MCV: 92.2 fL (ref 78.0–100.0)
Platelets: 187 10*3/uL (ref 150–400)
RBC: 3.44 MIL/uL — ABNORMAL LOW (ref 4.22–5.81)
RDW: 13.8 % (ref 11.5–15.5)
WBC: 6.2 10*3/uL (ref 4.0–10.5)

## 2015-08-15 LAB — T4, FREE: Free T4: 1.37 ng/dL — ABNORMAL HIGH (ref 0.61–1.12)

## 2015-08-15 LAB — RENAL FUNCTION PANEL
ALBUMIN: 2.2 g/dL — AB (ref 3.5–5.0)
ANION GAP: 6 (ref 5–15)
BUN: 35 mg/dL — ABNORMAL HIGH (ref 6–20)
CALCIUM: 7.7 mg/dL — AB (ref 8.9–10.3)
CO2: 30 mmol/L (ref 22–32)
CREATININE: 4.31 mg/dL — AB (ref 0.61–1.24)
Chloride: 99 mmol/L — ABNORMAL LOW (ref 101–111)
GFR calc Af Amer: 14 mL/min — ABNORMAL LOW (ref >60)
GFR calc non Af Amer: 12 mL/min — ABNORMAL LOW (ref >60)
GLUCOSE: 149 mg/dL — AB (ref 65–99)
Phosphorus: 4.6 mg/dL (ref 2.5–4.6)
Potassium: 3.3 mmol/L — ABNORMAL LOW (ref 3.5–5.1)
SODIUM: 135 mmol/L (ref 135–145)

## 2015-08-15 LAB — GLUCOSE, CAPILLARY
GLUCOSE-CAPILLARY: 163 mg/dL — AB (ref 65–99)
GLUCOSE-CAPILLARY: 185 mg/dL — AB (ref 65–99)

## 2015-08-15 MED ORDER — AMLODIPINE BESYLATE 5 MG PO TABS: 5.0000 mg | ORAL_TABLET | Freq: Every day | ORAL | Status: DC

## 2015-08-15 MED ORDER — CEFUROXIME AXETIL 500 MG PO TABS: 500.0000 mg | ORAL_TABLET | Freq: Two times a day (BID) | ORAL | Status: DC

## 2015-08-15 MED ORDER — LISINOPRIL 5 MG PO TABS: 5.0000 mg | ORAL_TABLET | Freq: Every day | ORAL | Status: DC

## 2015-08-15 MED ORDER — CALCIUM ACETATE (PHOS BINDER) 667 MG PO CAPS: 667.0000 mg | ORAL_CAPSULE | Freq: Three times a day (TID) | ORAL | Status: DC

## 2015-08-15 MED ORDER — ONDANSETRON HCL 4 MG PO TABS: 4.0000 mg | ORAL_TABLET | Freq: Three times a day (TID) | ORAL | Status: DC | PRN

## 2015-08-15 MED ORDER — POTASSIUM CHLORIDE CRYS ER 20 MEQ PO TBCR
20.0000 meq | EXTENDED_RELEASE_TABLET | Freq: Every day | ORAL | Status: AC
  Administered 2015-08-15: 20 meq via ORAL
  Filled 2015-08-15: qty 1

## 2015-08-15 MED ORDER — POTASSIUM CHLORIDE CRYS ER 20 MEQ PO TBCR
40.0000 meq | EXTENDED_RELEASE_TABLET | Freq: Once | ORAL | Status: AC
  Administered 2015-08-15: 40 meq via ORAL
  Filled 2015-08-15: qty 2

## 2015-08-15 NOTE — Progress Notes (Signed)
Patient with orders to be discharge home. Discharge instructions given, patient and spouse verbalized understanding. Patient stable. Patient left in private vehicle with spouse.

## 2015-08-15 NOTE — Care Management Note (Signed)
Case Management Note  Patient Details  Name: Antwione Owusu MRN: [id number] Date of Birth: Jan 12, 1941    Expected Discharge Date:                 08/15/2015 Expected Discharge Plan:  Home/Self Care  In-House Referral:  NA  Discharge planning Services  CM Consult  Post Acute Care Choice:  NA Choice offered to:  NA  DME Arranged:    DME Agency:     HH Arranged:    [place] Agency:     Status of Service:  Completed, signed off  If discussed at [employer] of Stay Meetings, dates discussed:    Additional Comments: Patient d/c home with self care, no CM needs.   Azarion Hove, Chauncey Reading, RN 08/15/2015, 11:14 AM

## 2015-08-15 NOTE — Progress Notes (Signed)
Subjective: Interval History: Patient presently offers no complaints. He denies any fevers chills or sweating. His nausea and vomiting has improved and no abdominal pain.  Objective: Vital signs in last 24 hours: Temp:  [97.7 F (36.5 C)-98.2 F (36.8 C)] 98.2 F (36.8 C) (06/27 0622) Pulse Rate:  [70-82] 77 (06/27 0622) Resp:  [16] 16 (06/27 0622) BP: (118-152)/(66-76) 128/71 mmHg (06/27 0622) SpO2:  [95 %-98 %] 95 % (06/27 0622) Weight:  [69.5 kg (153 lb 3.5 oz)-69.6 kg (153 lb 7 oz)] 69.5 kg (153 lb 3.5 oz) (06/27 0622) Weight change: 0.109 kg (3.8 oz)  Intake/Output from previous day: 06/26 0701 - 06/27 0700 In: 660 [I.V.:610; IV Piggyback:50] Out: 40 [Urine:200] Intake/Output this shift:    General appearance: alert, cooperative and no distress Resp: clear to auscultation bilaterally Cardio: regular rate and rhythm, S1, S2 normal, no murmur, click, rub or gallop GI: soft, non-tender; bowel sounds normal; no masses,  no organomegaly Extremities: No edema  Lab Results:  Recent Labs  08/14/15 0619 08/15/15 0528  WBC 7.0 6.2  HGB 11.3* 10.7*  HCT 34.6* 31.7*  PLT 216 187   BMET:   Recent Labs  08/14/15 0619 08/15/15 0528  NA 137 135  K 3.4* 3.3*  CL 100* 99*  CO2 27 30  GLUCOSE 194* 149*  BUN 63* 35*  CREATININE 6.08* 4.31*  CALCIUM 7.8* 7.7*   No results for input(s): PTH in the last 72 hours. Iron Studies:   Recent Labs  08/14/15 0619  IRON 121  TIBC 203*    Studies/Results: Dg Abd 1 View  08/13/2015  CLINICAL DATA:  Nausea and vomiting. EXAM: ABDOMEN - 1 VIEW COMPARISON:  CT of the abdomen and pelvis on 04/12/2015 FINDINGS: No evidence of bowel obstruction or significant ileus. Advanced spondylosis of the lumbar spine present with associated leftward convex scoliosis. There likely is a small left pleural effusion. Advanced degenerative disease of the right hip joint. IMPRESSION: Unremarkable bowel gas pattern. Probable small left pleural  effusion. Electronically Signed   By: Aletta Edouard M.D.   On: 08/13/2015 11:40    I have reviewed the patient's current medications.  Assessment/Plan: Problem #1 end-stage renal disease: He is status post hemodialysis yesterday. Presently he is asymptomatic. Problem #2 hypertension: His blood pressure is fluctuating but stable Problem# 3 anemia: His hemoglobin is with in target range and he is on Epogen. Problem #4 possible UTI: Presently is a febrile. Culture no growth. Problem #5 metabolic bone disease: Calcium and his phosphorus is range. Presently started on PhosLo and phosphorus has normalized. Problem #6 diabetes: His blood sugar is slightly high but overall controlled. Problem# 7 coronary artery disease: Presently doesn't have any chest pain Problem #8 hypokalemia Plan: 1]We'll give patient KCl 40 mEq by mouth 1 dose. 2] we'll continue the binder 3] his next dialysis [facility] be tomorrow which is his regular schedule.     Alan [name] S 08/15/2015,7:55 AM

## 2015-08-15 NOTE — Discharge Summary (Signed)
Physician Discharge Summary  Alan [name] [id number] DOB: 02-05-1941 DOA: 08/12/2015  PCP: PROVIDER NOT IN SYSTEM  Admit date: 08/12/2015 Discharge date: 08/15/2015  Time spent: Greater than 30 minutes  Recommendations for Outpatient Follow-up:  1. Final urine culture results were pending at the time of discharge. Recommend follow-up on the results.  2. Recommend follow-up TSH and free T4 in 3-6 months for reassessment of subclinical hyperthyroidism.   Discharge Diagnoses:  1. Acute pyelonephritis. 2. Nausea and vomiting secondary to #1. 3. CAD. 4. Hypertension. 5. End-stage renal disease, on hemodialysis. 6. Insulin requiring type 2 diabetes mellitus. 7. Subclinical hyperthyroidism. 8. Normocytic anemia secondary to end-stage renal disease.  Discharge Condition: Improved.  Diet recommendation: Heart healthy/carbohydrate modified.  Filed Weights   08/14/15 0603 08/14/15 1435 08/15/15 0622  Weight: 69.491 kg (153 lb 3.2 oz) 69.6 kg (153 lb 7 oz) 69.5 kg (153 lb 3.5 oz)    History of present illness:  Patient is a 75 year old man with a history of CAD, status post CABG, ESRD, HTN, DM-2, who presented to the ED on 08/12/2015 with a chief complaint of painful urination, nausea, and vomiting. In the ED, he was afebrile and hemodynamically stable. His lab data revealed positive nitrite, many bacteria, and too numerous to count WBCs. Lab data were significant for mildly elevated bilirubin, normal liver transaminases, glucose of 200, BNP of 114, creatinine of 5.21, BUN of 50, and normal WBC. He was admitted for further management.  Hospital Course:  1. Acute pyelonephritis. Urine culture was ordered. Patient was started on IV Rocephin. He was started on gentle IV fluids. Zofran was ordered for nausea. The urine culture eventually grew out greater than 100,000 colonies of gram-negative rods, but the identification was pending at the time of discharge. Patient improved clinically and  symptomatically. He was discharged on 4 more days of Ceftin.  Nausea and vomiting. Patient denied abdominal pain and had no abdominal tenderness on exam. During the first 24 hours, he had a couple of episodes of nausea and vomiting. His GI symptoms were presumed to be secondary to acute pyelonephritis. His liver transaminases were within normal limits. Abdominal x-ray was unremarkable. Lipase was within normal limits. His diet was downgraded. Pepcid was given every 12 hours. Zofran was scheduled every 6 hours. The patient improved clinically and symptomatically. His diet was advanced again and he tolerated it well. IV Pepcid was discontinued as was scheduled Zofran.  CAD and hypertension. Patient denied chest pain. He is supposed be treated with amlodipine, but he had not taken it in several days before admission; Lipitor, but he stopped taking it due to muscle weakness; lisinopril, but he had not been taking it, sublingual nitroglycerin as needed, and aspirin. -Oral medications were initially held. Transdermal clonidine and as needed hydralazine were started to treat his hypertension. -When GI symptoms resolved and he was restarted on lisinopril and amlodipine. He was encouraged to be compliant with these medications. His blood pressure was 128/71 at the time of discharge.  Diabetes mellitus, insulin requiring. Patient is treated with Lantus chronically. It was held on admission. Sliding scale NovoLog was started. His A1c was only 5.2.  His CBGs were controlled. Lantus was restarted at the time of discharge. Sliding scale NovoLog was discontinued.  Possible subclinical hyperthyroidism. TSH was mildly low at 0.28. Free T4 was mildly elevated at 1.37. Patient could have mild subclinical hyperthyroidism, but these measures were borderline. Therefore, would defer and recommend follow-up TSH and free T4 in 3-6 months.  Normocytic anemia,  likely due to end-stage renal disease. Patient's total iron was  within normal limits at 121, TIBC 203, and vitamin B12 elevated at 1336. His hemoglobin was 10.7 at the time of discharge.  Procedures:  Hemodialysis  Consultations:  Nephrology  Discharge Exam: Filed Vitals:   08/14/15 2048 08/15/15 0622  BP: 132/76 128/71  Pulse: 74 77  Temp: 98.2 F (36.8 C) 98.2 F (36.8 C)  Resp: 16 16    General exam: 75 year old man who continues to look improved. Respiratory system: Clear to auscultation; decreased breath sounds in the bases. Respiratory effort normal. Cardiovascular system: S1 & S2 with soft systolic murmur.. No edema bilateral lower extremity stumps. Gastrointestinal system: Abdomen is nondistended, soft and nontender. No organomegaly or masses felt. Normal bowel sounds heard. Central nervous system: Alert and oriented. No focal neurological deficits. Extremities:Bilateral lower extremity amputee Skin: No rashes, lesions or ulcers Psychiatry: Mood much improved.  Discharge Instructions   Discharge Instructions    Call MD for:  persistant nausea and vomiting    Complete by:  As directed      Call MD for:  temperature >100.4    Complete by:  As directed      Diet - low sodium heart healthy    Complete by:  As directed      Increase activity slowly    Complete by:  As directed           Current Discharge Medication List    START taking these medications   Details  calcium acetate (PHOSLO) 667 MG capsule Take 1 capsule (667 mg total) by mouth 3 (three) times daily with meals. Qty: 90 capsule, Refills: 3    cefUROXime (CEFTIN) 500 MG tablet Take 1 tablet (500 mg total) by mouth 2 (two) times daily with a meal. Antibiotic to be taken for 4 more days. Qty: 8 tablet, Refills: 0    ondansetron (ZOFRAN) 4 MG tablet Take 1 tablet (4 mg total) by mouth every 8 (eight) hours as needed for nausea or vomiting. Qty: 20 tablet, Refills: 0      CONTINUE these medications which have CHANGED   Details  amLODipine (NORVASC) 5 MG  tablet Take 1 tablet (5 mg total) by mouth daily. Qty: 30 tablet, Refills: 3    lisinopril (PRINIVIL,ZESTRIL) 5 MG tablet Take 1 tablet (5 mg total) by mouth daily. Qty: 30 tablet, Refills: 3      CONTINUE these medications which have NOT CHANGED   Details  Amino Acid Infusion (PROSOL) 20 % SOLN every Monday, Wednesday, and Friday.     aspirin EC 81 MG tablet Take 81 mg by mouth at bedtime.     B Complex-C-Folic Acid (NEPHRO-VITE PO) Take 1 tablet by mouth every evening.     furosemide (LASIX) 40 MG tablet TAKE 1/2 TABLET BY MOUTH EVERY EVENING Qty: 30 tablet, Refills: 3    LANTUS SOLOSTAR 100 UNIT/ML Solostar Pen Inject 8 Units into the skin every evening.     nitroGLYCERIN (NITROSTAT) 0.4 MG SL tablet Place 1 tablet (0.4 mg total) under the tongue every 5 (five) minutes as needed for chest pain. Qty: 30 tablet, Refills: 0    omeprazole (PRILOSEC) 20 MG capsule Take 1 capsule (20 mg total) by mouth 2 (two) times daily before a meal.    acetaminophen (TYLENOL) 500 MG tablet Take 500 mg by mouth 2 (two) times daily.      STOP taking these medications     atorvastatin (LIPITOR) 80 MG tablet  Allergies  Allergen Reactions  . Codeine Itching and Nausea And Vomiting  . [place] Venom [Bee Venom] Swelling      The results of significant diagnostics from this hospitalization (including imaging, microbiology, ancillary and laboratory) are listed below for reference.    Significant Diagnostic Studies: Dg Abd 1 View  08/13/2015  CLINICAL DATA:  Nausea and vomiting. EXAM: ABDOMEN - 1 VIEW COMPARISON:  CT of the abdomen and pelvis on 04/12/2015 FINDINGS: No evidence of bowel obstruction or significant ileus. Advanced spondylosis of the lumbar spine present with associated leftward convex scoliosis. There likely is a small left pleural effusion. Advanced degenerative disease of the right hip joint. IMPRESSION: Unremarkable bowel gas pattern. Probable small left pleural  effusion. Electronically Signed   By: Aletta Edouard M.D.   On: 08/13/2015 11:40   Dg Chest Portable 1 View  08/12/2015  CLINICAL DATA:  Vomiting, weakness, pain with urination, cough EXAM: PORTABLE CHEST 1 VIEW COMPARISON:  05/12/2015 FINDINGS: Cardiomediastinal silhouette is stable. No acute infiltrate or pleural effusion. No pulmonary edema. Degenerative changes bilateral shoulders again noted. Dextroscoliosis lower thoracic spine. IMPRESSION: No active disease. Electronically Signed   By: Lahoma Crocker M.D.   On: 08/12/2015 17:29    Microbiology: Recent Results (from the past 240 hour(s))  Urine culture     Status: Abnormal (Preliminary result)   Collection Time: 08/12/15  7:48 PM  Result Value Ref Range Status   Specimen Description URINE, CLEAN CATCH  Final   Special Requests Normal  Final   Culture >=100,000 COLONIES/mL GRAM NEGATIVE RODS (A)  Final   Report Status PENDING  Incomplete  MRSA PCR Screening     Status: None   Collection Time: 08/12/15  9:32 PM  Result Value Ref Range Status   MRSA by PCR NEGATIVE NEGATIVE Final    Comment:        The GeneXpert MRSA Assay (FDA approved for NASAL specimens only), is one component of a comprehensive MRSA colonization surveillance program. It is not intended to diagnose MRSA infection nor to guide or monitor treatment for MRSA infections.      Labs: Basic Metabolic Panel:  Recent Labs Lab 08/12/15 1731 08/13/15 0449 08/13/15 2010 08/14/15 0619 08/15/15 0528  NA 136 136 137 137 135  K 3.9 3.6 3.4* 3.4* 3.3*  CL 96* 98* 100* 100* 99*  CO2 26 27 26 27 30   GLUCOSE 200* 209* 223* 194* 149*  BUN 50* 56* 62* 63* 35*  CREATININE 5.21* 5.53* 5.89* 6.08* 4.31*  CALCIUM 7.8* 7.8* 7.8* 7.8* 7.7*  PHOS  --   --  5.0* 5.5* 4.6   Liver Function Tests:  Recent Labs Lab 08/12/15 1731 08/13/15 2010 08/14/15 0619 08/15/15 0528  AST 12*  --   --   --   ALT 12*  --   --   --   ALKPHOS 68  --   --   --   BILITOT 1.6*  --   --    --   PROT 6.0*  --   --   --   ALBUMIN 2.6* 2.5* 2.4* 2.2*    Recent Labs Lab 08/13/15 0449  LIPASE 15   No results for input(s): AMMONIA in the last 168 hours. CBC:  Recent Labs Lab 08/12/15 1731 08/13/15 0449 08/13/15 2010 08/14/15 0619 08/15/15 0528  WBC 8.9 8.6 8.1 7.0 6.2  NEUTROABS 7.2  --   --   --   --   HGB 11.9* 11.4* 11.0* 11.3* 10.7*  HCT 35.8* 34.9* 33.5* 34.6* 31.7*  MCV 92.0 92.1 93.1 93.3 92.2  PLT 215 213 210 216 187   Cardiac Enzymes: No results for input(s): CKTOTAL, CKMB, CKMBINDEX, TROPONINI in the last 168 hours. BNP: BNP (last 3 results)  Recent Labs  08/12/15 1731  BNP 114.0*    ProBNP (last 3 results) No results for input(s): PROBNP in the last 8760 hours.  CBG:  Recent Labs Lab 08/14/15 0811 08/14/15 1208 08/14/15 2027 08/15/15 0749 08/15/15 1144  GLUCAP 238* 220* 242* 163* 185*       Signed:  Akire Rennert MD.  [facility] 08/15/2015, 12:19 PM

## 2015-08-16 LAB — URINE CULTURE
Culture: >=100000 — AB
Special Requests: NORMAL

## 2015-08-18 ENCOUNTER — Ambulatory Visit (HOSPITAL_COMMUNITY): Payer: Medicare Other | Admitting: Occupational Therapy

## 2015-08-27 ENCOUNTER — Inpatient Hospital Stay (HOSPITAL_COMMUNITY)
Admission: EM | Admit: 2015-08-27 | Discharge: 2015-09-02 | DRG: 871 | Disposition: A | Discharge status: Home Health | Payer: Medicare Other | Attending: Internal Medicine | Admitting: Internal Medicine

## 2015-08-27 ENCOUNTER — Emergency Department (HOSPITAL_COMMUNITY): Payer: Medicare Other

## 2015-08-27 ENCOUNTER — Encounter (HOSPITAL_COMMUNITY): Payer: Self-pay

## 2015-08-27 DIAGNOSIS — K219 Gastro-esophageal reflux disease without esophagitis: Secondary | ICD-10-CM | POA: Diagnosis present

## 2015-08-27 DIAGNOSIS — M109 Gout, unspecified: Secondary | ICD-10-CM | POA: Diagnosis present

## 2015-08-27 DIAGNOSIS — I2510 Atherosclerotic heart disease of native coronary artery without angina pectoris: Secondary | ICD-10-CM | POA: Diagnosis present

## 2015-08-27 DIAGNOSIS — Z89512 Acquired absence of left leg below knee: Secondary | ICD-10-CM

## 2015-08-27 DIAGNOSIS — A419 Sepsis, unspecified organism: Secondary | ICD-10-CM | POA: Diagnosis not present

## 2015-08-27 DIAGNOSIS — E872 Acidosis: Secondary | ICD-10-CM | POA: Diagnosis present

## 2015-08-27 DIAGNOSIS — Z993 Dependence on wheelchair: Secondary | ICD-10-CM

## 2015-08-27 DIAGNOSIS — I10 Essential (primary) hypertension: Secondary | ICD-10-CM | POA: Diagnosis present

## 2015-08-27 DIAGNOSIS — Z992 Dependence on renal dialysis: Secondary | ICD-10-CM

## 2015-08-27 DIAGNOSIS — Z79899 Other long term (current) drug therapy: Secondary | ICD-10-CM

## 2015-08-27 DIAGNOSIS — Z8711 Personal history of peptic ulcer disease: Secondary | ICD-10-CM

## 2015-08-27 DIAGNOSIS — E1159 Type 2 diabetes mellitus with other circulatory complications: Secondary | ICD-10-CM | POA: Diagnosis present

## 2015-08-27 DIAGNOSIS — R109 Unspecified abdominal pain: Secondary | ICD-10-CM | POA: Diagnosis not present

## 2015-08-27 DIAGNOSIS — N2581 Secondary hyperparathyroidism of renal origin: Secondary | ICD-10-CM | POA: Diagnosis present

## 2015-08-27 DIAGNOSIS — R3129 Other microscopic hematuria: Secondary | ICD-10-CM | POA: Diagnosis present

## 2015-08-27 DIAGNOSIS — N186 End stage renal disease: Secondary | ICD-10-CM | POA: Diagnosis present

## 2015-08-27 DIAGNOSIS — Z87891 Personal history of nicotine dependence: Secondary | ICD-10-CM

## 2015-08-27 DIAGNOSIS — Z794 Long term (current) use of insulin: Secondary | ICD-10-CM

## 2015-08-27 DIAGNOSIS — Z951 Presence of aortocoronary bypass graft: Secondary | ICD-10-CM

## 2015-08-27 DIAGNOSIS — B961 Klebsiella pneumoniae [K. pneumoniae] as the cause of diseases classified elsewhere: Secondary | ICD-10-CM | POA: Diagnosis present

## 2015-08-27 DIAGNOSIS — E43 Unspecified severe protein-calorie malnutrition: Secondary | ICD-10-CM | POA: Diagnosis present

## 2015-08-27 DIAGNOSIS — D638 Anemia in other chronic diseases classified elsewhere: Secondary | ICD-10-CM | POA: Diagnosis present

## 2015-08-27 DIAGNOSIS — M1990 Unspecified osteoarthritis, unspecified site: Secondary | ICD-10-CM | POA: Diagnosis present

## 2015-08-27 DIAGNOSIS — R911 Solitary pulmonary nodule: Secondary | ICD-10-CM | POA: Diagnosis present

## 2015-08-27 DIAGNOSIS — E1151 Type 2 diabetes mellitus with diabetic peripheral angiopathy without gangrene: Secondary | ICD-10-CM | POA: Diagnosis present

## 2015-08-27 DIAGNOSIS — N390 Urinary tract infection, site not specified: Secondary | ICD-10-CM | POA: Diagnosis present

## 2015-08-27 DIAGNOSIS — E871 Hypo-osmolality and hyponatremia: Secondary | ICD-10-CM | POA: Diagnosis present

## 2015-08-27 DIAGNOSIS — E1122 Type 2 diabetes mellitus with diabetic chronic kidney disease: Secondary | ICD-10-CM | POA: Diagnosis present

## 2015-08-27 DIAGNOSIS — R6521 Severe sepsis with septic shock: Secondary | ICD-10-CM | POA: Diagnosis present

## 2015-08-27 DIAGNOSIS — Z8673 Personal history of transient ischemic attack (TIA), and cerebral infarction without residual deficits: Secondary | ICD-10-CM

## 2015-08-27 DIAGNOSIS — Z89511 Acquired absence of right leg below knee: Secondary | ICD-10-CM

## 2015-08-27 DIAGNOSIS — Z7982 Long term (current) use of aspirin: Secondary | ICD-10-CM

## 2015-08-27 DIAGNOSIS — A09 Infectious gastroenteritis and colitis, unspecified: Secondary | ICD-10-CM | POA: Diagnosis present

## 2015-08-27 DIAGNOSIS — I120 Hypertensive chronic kidney disease with stage 5 chronic kidney disease or end stage renal disease: Secondary | ICD-10-CM | POA: Diagnosis present

## 2015-08-27 DIAGNOSIS — E7800 Pure hypercholesterolemia, unspecified: Secondary | ICD-10-CM | POA: Diagnosis present

## 2015-08-27 DIAGNOSIS — J45909 Unspecified asthma, uncomplicated: Secondary | ICD-10-CM | POA: Diagnosis present

## 2015-08-27 LAB — PROTIME-INR
INR: 1.07 (ref 0.00–1.49)
PROTHROMBIN TIME: 14.1 s (ref 11.6–15.2)

## 2015-08-27 LAB — COMPREHENSIVE METABOLIC PANEL
ALBUMIN: 2.7 g/dL — AB (ref 3.5–5.0)
ALK PHOS: 208 U/L — AB (ref 38–126)
ALT: 16 U/L — ABNORMAL LOW (ref 17–63)
ANION GAP: 11 (ref 5–15)
AST: 26 U/L (ref 15–41)
BUN: 44 mg/dL — ABNORMAL HIGH (ref 6–20)
CALCIUM: 8 mg/dL — AB (ref 8.9–10.3)
CO2: 26 mmol/L (ref 22–32)
Chloride: 98 mmol/L — ABNORMAL LOW (ref 101–111)
Creatinine, Ser: 4.98 mg/dL — ABNORMAL HIGH (ref 0.61–1.24)
GFR calc non Af Amer: 10 mL/min — ABNORMAL LOW (ref >60)
GFR, EST AFRICAN AMERICAN: 12 mL/min — AB (ref >60)
GLUCOSE: 143 mg/dL — AB (ref 65–99)
POTASSIUM: 3.7 mmol/L (ref 3.5–5.1)
SODIUM: 135 mmol/L (ref 135–145)
Total Bilirubin: 1.2 mg/dL (ref 0.3–1.2)
Total Protein: 6.5 g/dL (ref 6.5–8.1)

## 2015-08-27 LAB — URINALYSIS, ROUTINE W REFLEX MICROSCOPIC
Glucose, UA: NEGATIVE mg/dL
KETONES UR: NEGATIVE mg/dL
NITRITE: NEGATIVE
Specific Gravity, Urine: 1.02 (ref 1.005–1.030)
pH: 6 (ref 5.0–8.0)

## 2015-08-27 LAB — CBC WITH DIFFERENTIAL/PLATELET
BASOS PCT: 0 %
Basophils Absolute: 0 10*3/uL (ref 0.0–0.1)
EOS ABS: 0 10*3/uL (ref 0.0–0.7)
Eosinophils Relative: 0 %
HEMATOCRIT: 41.1 % (ref 39.0–52.0)
HEMOGLOBIN: 13.4 g/dL (ref 13.0–17.0)
LYMPHS ABS: 2.2 10*3/uL (ref 0.7–4.0)
Lymphocytes Relative: 14 %
MCH: 30.7 pg (ref 26.0–34.0)
MCHC: 32.6 g/dL (ref 30.0–36.0)
MCV: 94.3 fL (ref 78.0–100.0)
Monocytes Absolute: 0.6 10*3/uL (ref 0.1–1.0)
Monocytes Relative: 4 %
NEUTROS ABS: 13 10*3/uL — AB (ref 1.7–7.7)
NEUTROS PCT: 82 %
Platelets: 234 10*3/uL (ref 150–400)
RBC: 4.36 MIL/uL (ref 4.22–5.81)
RDW: 13.9 % (ref 11.5–15.5)
WBC: 15.8 10*3/uL — AB (ref 4.0–10.5)

## 2015-08-27 LAB — OCCULT BLOOD GASTRIC / DUODENUM (SPECIMEN CUP)
OCCULT BLOOD, GASTRIC: NEGATIVE
pH, Gastric: 5

## 2015-08-27 LAB — URINE MICROSCOPIC-ADD ON

## 2015-08-27 LAB — I-STAT CG4 LACTIC ACID, ED: Lactic Acid, Venous: 4.46 mmol/L (ref 0.5–1.9)

## 2015-08-27 LAB — LIPASE, BLOOD: Lipase: 22 U/L (ref 11–51)

## 2015-08-27 MED ORDER — SODIUM CHLORIDE 0.9 % IV BOLUS (SEPSIS)
1000.0000 mL | Freq: Once | INTRAVENOUS | Status: AC
  Administered 2015-08-27: 1000 mL via INTRAVENOUS

## 2015-08-27 MED ORDER — VANCOMYCIN HCL IN DEXTROSE 1-5 GM/200ML-% IV SOLN
1000.0000 mg | Freq: Once | INTRAVENOUS | Status: AC
  Administered 2015-08-27: 1000 mg via INTRAVENOUS
  Filled 2015-08-27: qty 200

## 2015-08-27 MED ORDER — SODIUM CHLORIDE 0.9 % IV BOLUS (SEPSIS)
500.0000 mL | Freq: Once | INTRAVENOUS | Status: AC
  Administered 2015-08-27: 500 mL via INTRAVENOUS

## 2015-08-27 MED ORDER — PIPERACILLIN-TAZOBACTAM IN DEX 2-0.25 GM/50ML IV SOLN
2.2500 g | Freq: Three times a day (TID) | INTRAVENOUS | Status: DC
  Administered 2015-08-28 – 2015-08-30 (×6): 2.25 g via INTRAVENOUS
  Filled 2015-08-27 (×12): qty 50

## 2015-08-27 MED ORDER — ONDANSETRON HCL 4 MG/2ML IJ SOLN
4.0000 mg | Freq: Once | INTRAMUSCULAR | Status: AC
  Administered 2015-08-27: 4 mg via INTRAVENOUS
  Filled 2015-08-27: qty 2

## 2015-08-27 MED ORDER — SODIUM CHLORIDE 0.9 % IV BOLUS (SEPSIS)
250.0000 mL | Freq: Once | INTRAVENOUS | Status: AC
  Administered 2015-08-27: 250 mL via INTRAVENOUS

## 2015-08-27 MED ORDER — HYDROMORPHONE HCL 1 MG/ML IJ SOLN
1.0000 mg | Freq: Once | INTRAMUSCULAR | Status: AC
  Administered 2015-08-27: 1 mg via INTRAVENOUS
  Filled 2015-08-27: qty 1

## 2015-08-27 MED ORDER — PIPERACILLIN-TAZOBACTAM 3.375 G IVPB 30 MIN
3.3750 g | Freq: Once | INTRAVENOUS | Status: DC
  Administered 2015-08-27: 3.375 g via INTRAVENOUS
  Filled 2015-08-27: qty 50

## 2015-08-27 MED ORDER — FENTANYL CITRATE (PF) 100 MCG/2ML IJ SOLN
50.0000 ug | Freq: Once | INTRAMUSCULAR | Status: AC
  Administered 2015-08-27: 50 ug via INTRAVENOUS
  Filled 2015-08-27: qty 2

## 2015-08-27 NOTE — ED Provider Notes (Signed)
CSN: [id number]     Arrival date & time 08/27/15  2016 History  By signing my name below, I, Higinio Plan, attest that this documentation has been prepared under the direction and in the presence of Sherwood Gambler, MD . Electronically Signed: Higinio Plan, Scribe. 08/27/2015. 8:56 PM.   Chief Complaint  Patient presents with  . Flank Pain   The history is provided by the patient and the spouse. No language interpreter was used.   HPI Comments: Alan [name] is a 75 y.o. male with PMHx of DM, CAD, and ESRD, who presents to the Emergency Department complaining of gradually worsening, constant, 7/10, sharp, left-sided flank pain that began at ~1pm this afternoon. Pt notes he visited he ED on 6/24 for similar symptoms and was diagnosed with a UTI; pt reports his pain feels similar. Pt reports 3 episodes of vomiting today; he states he has continued to vomit ever since he left the ED. Per wife, pt feels better and does not have episodes of vomiting when lying down. Pt states associated dysuria today; he notes he urinates ~2-3 times a day. Pt also reports associated coughing that began today, shortness of breath, dizziness, and lightheadedness. He states he had dialysis in his left arm on Friday. He denies fever, hematuria, and blood from bottom or in stool. He also denies hx of a kidney stone. Pt's wife states pt's blood pressure usually runs very low. Per wife, pt takes aspirin.   Past Medical History  Diagnosis Date  . Diabetes mellitus   . Hypertension   . Gout   . Coronary artery disease 2000    Stents [facility]  . Stroke ([place])   . Asthma   . Hypercholesteremia   . Arthritis   . DDD (degenerative disc disease), lumbar   . DDD (degenerative disc disease), cervical   . Gangrene of toe ([place]) Feb. 2015    Right  great    . Gastric ulcer     EGD 2014  . 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 Clotted renal dialysis arteriovenous graft ([place])   . CVA (cerebral infarction)   . ESRD (end  stage renal disease) on dialysis ([place])   . 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   Past Surgical History  Procedure Laterality Date  . Coronary artery bypass graft  1996    [facility]  . Below knee leg amputation Bilateral   . Esophagogastroduodenoscopy Left 05/24/2012    [id number] dilated baggy but otherwise a normal/ Small hiatal hernia. Gastric ulcer -S/P biopsy  . Amputation Right 04/10/2013    Procedure: Right Below Knee Amputation;  Surgeon: Newt Minion, MD;  Location: [place];  Service: Orthopedics;  Laterality: Right;  Right Below Knee Amputation  . Appendectomy    . Peripheral vascular catheterization N/A 08/09/2014    Procedure: A/V Shuntogram/Fistulagram;  Surgeon: Katha Cabal, MD;  Location: [place] [facility];  Service: Cardiovascular;  Laterality: N/A;  . Peripheral vascular catheterization Left 08/09/2014    Procedure: A/V Shunt Intervention;  Surgeon: Katha Cabal, MD;  Location: [place] [facility];  Service: Cardiovascular;  Laterality: Left;  . Cholecystectomy     Family History  Problem Relation Age of Onset  . Colon cancer Neg Hx   . Cancer Mother    Social History  Substance Use Topics  . Smoking status: Former Smoker    Quit date: 08/08/2004  . Smokeless tobacco: Never Used  . Alcohol Use: No    Review of Systems  Constitutional: Negative for fever.  Respiratory: Positive for cough and shortness of breath.   Gastrointestinal: Negative for blood in stool.  Genitourinary: Positive for dysuria and flank pain. Negative for hematuria.  Neurological: Positive for dizziness and light-headedness.   Allergies  Codeine and Yellow jacket venom  Home Medications   Prior to Admission medications   Medication Sig Start Date End Date Taking? Authorizing Provider  acetaminophen (TYLENOL) 500 MG tablet Take 500 mg by mouth 2 (two) times daily.    Historical Provider, MD  Amino Acid Infusion (PROSOL) 20 % SOLN every Monday, Wednesday, and Friday.  10/27/14    Historical Provider, MD  amLODipine (NORVASC) 5 MG tablet Take 1 tablet (5 mg total) by mouth daily. 08/15/15   Rexene Alberts, MD  aspirin EC 81 MG tablet Take 81 mg by mouth at bedtime.     Historical Provider, MD  B Complex-C-Folic Acid (NEPHRO-VITE PO) Take 1 tablet by mouth every evening.     Historical Provider, MD  calcium acetate (PHOSLO) 667 MG capsule Take 1 capsule (667 mg total) by mouth 3 (three) times daily with meals. 08/15/15   Rexene Alberts, MD  cefUROXime (CEFTIN) 500 MG tablet Take 1 tablet (500 mg total) by mouth 2 (two) times daily with a meal. Antibiotic to be taken for 4 more days. 08/15/15   Rexene Alberts, MD  furosemide (LASIX) 40 MG tablet TAKE 1/2 TABLET BY MOUTH EVERY EVENING 05/22/15   Lendon Colonel, NP  LANTUS SOLOSTAR 100 UNIT/ML Solostar Pen Inject 8 Units into the skin every evening.  12/21/13   Historical Provider, MD  lisinopril (PRINIVIL,ZESTRIL) 5 MG tablet Take 1 tablet (5 mg total) by mouth daily. 08/15/15   Rexene Alberts, MD  nitroGLYCERIN (NITROSTAT) 0.4 MG SL tablet Place 1 tablet (0.4 mg total) under the tongue every 5 (five) minutes as needed for chest pain. 11/18/14   Samuella Cota, MD  omeprazole (PRILOSEC) 20 MG capsule Take 1 capsule (20 mg total) by mouth 2 (two) times daily before a meal. 06/17/13   Radene Gunning, NP  ondansetron (ZOFRAN) 4 MG tablet Take 1 tablet (4 mg total) by mouth every 8 (eight) hours as needed for nausea or vomiting. 08/15/15   Rexene Alberts, MD   BP 70/31 mmHg  Pulse 67  Temp(Src) 97.6 F (36.4 C) (Oral)  Resp 18  Wt 153 lb (69.4 kg)  SpO2 100% Physical Exam  Constitutional: He is oriented to person, place, and time. He appears well-developed and well-nourished.  HENT:  Head: Normocephalic and atraumatic.  Right Ear: External ear normal.  Left Ear: External ear normal.  Nose: Nose normal.  Eyes: Right eye exhibits no discharge. Left eye exhibits no discharge.  Neck: Neck supple.  Cardiovascular: Normal rate, regular  rhythm, normal heart sounds and intact distal pulses.   Pulmonary/Chest: Effort normal.  Decreased left sided breath sounds  Abdominal: Soft. There is tenderness.  Diffuse upper abdominal tenderness   Musculoskeletal: He exhibits no edema.  Bilateral BKA  Neurological: He is alert and oriented to person, place, and time.  Skin: Skin is warm. He is diaphoretic.  Nursing note and vitals reviewed.   ED Course  Procedures  DIAGNOSTIC STUDIES:  Oxygen Saturation is 100% on RA, normal by my interpretation.    COORDINATION OF CARE:  8:50 PM Discussed treatment plan with pt at bedside and pt agreed to plan.  Labs Review Labs Reviewed  COMPREHENSIVE METABOLIC PANEL - Abnormal; Notable for the following:  Chloride 98 (*)    Glucose, Bld 143 (*)    BUN 44 (*)    Creatinine, Ser 4.98 (*)    Calcium 8.0 (*)    Albumin 2.7 (*)    ALT 16 (*)    Alkaline Phosphatase 208 (*)    GFR calc non Af Amer 10 (*)    GFR calc Af Amer 12 (*)    All other components within normal limits  CBC WITH DIFFERENTIAL/PLATELET - Abnormal; Notable for the following:    WBC 15.8 (*)    Neutro Abs 13.0 (*)    All other components within normal limits  URINALYSIS, ROUTINE W REFLEX MICROSCOPIC (NOT AT [facility]) - Abnormal; Notable for the following:    APPearance TURBID (*)    Hgb urine dipstick LARGE (*)    Bilirubin Urine SMALL (*)    Protein, ur >300 (*)    Leukocytes, UA LARGE (*)    All other components within normal limits  URINE MICROSCOPIC-ADD ON - Abnormal; Notable for the following:    Squamous Epithelial / LPF 6-30 (*)    Bacteria, UA MANY (*)    All other components within normal limits  I-STAT CG4 LACTIC ACID, ED - Abnormal; Notable for the following:    Lactic Acid, Venous 4.46 (*)    All other components within normal limits  I-STAT CG4 LACTIC ACID, ED - Abnormal; Notable for the following:    Lactic Acid, Venous 5.23 (*)    All other components within normal limits  CULTURE, BLOOD  (ROUTINE X 2)  CULTURE, BLOOD (ROUTINE X 2)  URINE CULTURE  LIPASE, BLOOD  PROTIME-INR  OCCULT BLOOD GASTRIC / DUODENUM (SPECIMEN CUP)  I-STAT CG4 LACTIC ACID, ED  I-STAT CG4 LACTIC ACID, ED    Imaging Review Dg Chest Portable 1 View  08/27/2015  CLINICAL DATA:  Gradually worsening chest pain, left-sided, beginning earlier today. EXAM: PORTABLE CHEST 1 VIEW COMPARISON:  08/12/2015.  05/12/2015. FINDINGS: Poor inspiration. Previous CABG. Chronic elevation of the hemidiaphragms with chronic scarring at the left lung base. No evidence of any active process or change. No edema. No effusions. IMPRESSION: Previous CABG.  Poor inspiration.  Chronic left lung scarring. Electronically Signed   By: Nelson Chimes M.D.   On: 08/27/2015 21:18   Ct Renal Stone Study  08/28/2015  CLINICAL DATA:  Left flank pain beginning this afternoon and gradually worsening. Sepsis. EXAM: CT ABDOMEN AND PELVIS WITHOUT CONTRAST TECHNIQUE: Multidetector CT imaging of the abdomen and pelvis was performed following the standard protocol without IV contrast. COMPARISON:  04/12/2015 FINDINGS: Dependent atelectasis and fibrosis in the lung bases. Right lung base nodule measuring 10 mm diameter. This was not seen on the prior study but may not have been included within the field of view. The nodule is noncalcified and indeterminate. Consider one of the following in 3 months for both low-risk and high-risk individuals: (a) repeat chest CT, (b) follow-up PET-CT, or (c) tissue sampling. This recommendation follows the consensus statement: Guidelines for Management of Incidental Pulmonary Nodules Detected on CT Images:From the Fleischner Society 2017; published online before print (10.1148/radiol.IJ:2314499). Scattered calcified granulomas in the liver. Surgical absence of the gallbladder. No bile duct dilatation. The unenhanced appearance of the pancreas, spleen, adrenal glands, kidneys, inferior vena cava, and abdominal aorta is unremarkable.  Small esophageal hiatal hernia. Small bowel are nondistended. There is diffuse dilatation of the colon, filled with stool and fluid. This could represent changes due to diarrhea or constipation. There is stranding in  the pericolonic fat around the descending colon which may also indicate colitis. No free air or free fluid in the abdomen. Subxiphoid hernias containing fat. Pelvis: Appendix is not identified. Prostate gland is enlarged, measuring about 4.7 cm diameter. No free fluid in the pelvis. No significant pelvic lymphadenopathy. Postoperative changes in the anterior abdominal wall. Diffuse degenerative change throughout the thoracic and lumbar spine with thoracic and lumbar scoliosis. Severe degenerative changes in the right hip possibly indicating right hip dysplasia. IMPRESSION: 10 mm right lung base nodule. See above recommendations for follow-up. Diffusely distended colon which is filled with stool and fluid. Pericolonic fatty infiltration around the descending region. Consider constipation, diarrhea, and/or colitis. Electronically Signed   By: Lucienne Capers M.D.   On: 08/28/2015 01:17   I have personally reviewed and evaluated these images and lab results as part of my medical decision-making.   EKG Interpretation   Date/Time:  Sunday August 27 2015 20:51:02 EDT Ventricular Rate:  80 PR Interval:    QRS Duration: 90 QT Interval:  389 QTC Calculation: 449 R Axis:   8 Text Interpretation:  Sinus rhythm Atrial premature complex Probable left  ventricular hypertrophy Confirmed by Ofilia Rayon MD, Akshar Starnes [id number]) on  08/28/2015 12:30:03 AM      CRITICAL CARE Performed by: Sherwood Gambler T   Total critical care time: 60 minutes  Critical care time was exclusive of separately billable procedures and treating other patients.  Critical care was necessary to treat or prevent imminent or life-threatening deterioration.  Critical care was time spent personally by me on the following  activities: development of treatment plan with patient and/or surrogate as well as nursing, discussions with consultants, evaluation of patient's response to treatment, examination of patient, obtaining history from patient or surrogate, ordering and performing treatments and interventions, ordering and review of laboratory studies, ordering and review of radiographic studies, pulse oximetry and re-evaluation of patient's condition.  MDM   Final diagnoses:  Septic shock ([place])    Patient presents with hypotension and flank pain. Found to have UTI, treated per sepsis protocol with fluids, broad abx. CT obtained which does not show stone. Diaphoresis has improved, BP now in high 80s but lactate not improving. Wife and patient state his BP is usually in 90s at dialysis (before starting). D/w Dr. Oletta Darter who Donyell admit to ICU at [facility]. Recommends phenylephrine IV peripherally and transfer. I did attempt a Right IJ Central line but due to small size with complete collapse during attempt it was very difficult. I got flash and return multiple times (all dark red) but never could pass a the wire. Due to this, Ke[facility] do the peripheral pressors and transfer to [facility] given his mental status is good and MAP is right at 65.   I personally performed the services described in this documentation, which was scribed in my presence. The recorded information has been reviewed and is accurate.    Sherwood Gambler, MD 08/28/15 [phone]

## 2015-08-27 NOTE — ED Notes (Signed)
Started having right sided flank pain radiating into back.  Nausea and vomiting per pt.  Started around 1 pm.

## 2015-08-27 NOTE — Progress Notes (Signed)
Pharmacy Antibiotic Note  Alan [name] is a 75 y.o. male admitted on 08/27/2015 with sepsis.  Pharmacy has been consulted for Vancomycin & Zosyn dosing.  Plan: Zosyn 2.25 GM IV every 8 hours Vancomycin 1 GM IV x 1 dose (ESRD), next dose dependent on dialysis schedule Labs per protocol  Weight: 153 lb (69.4 kg)  Temp (24hrs), Avg:97.9 F (36.6 C), Min:97.6 F (36.4 C), Max:98.1 F (36.7 C)   Recent Labs Lab 08/27/15 2054 08/27/15 2111  WBC 15.8*  --   CREATININE 4.98*  --   LATICACIDVEN  --  4.46*    Estimated Creatinine Clearance: 12.4 mL/min (by C-G formula based on Cr of 4.98).    Allergies  Allergen Reactions  . Codeine Itching and Nausea And Vomiting  . Yellow Jacket Venom [Bee Venom] Swelling    Antimicrobials this admission: Zosyn 7/9 >>  Vancomycin  7/9 >>    Thank you for allowing pharmacy to be a part of this patient's care.  Chriss Czar 08/27/2015 10:04 PM

## 2015-08-27 NOTE — ED Notes (Signed)
Patient reports of pain worsening. Dr. Regenia Skeeter made aware. No new orders given.

## 2015-08-28 DIAGNOSIS — A419 Sepsis, unspecified organism: Secondary | ICD-10-CM | POA: Diagnosis present

## 2015-08-28 DIAGNOSIS — R109 Unspecified abdominal pain: Secondary | ICD-10-CM | POA: Diagnosis present

## 2015-08-28 DIAGNOSIS — Z992 Dependence on renal dialysis: Secondary | ICD-10-CM | POA: Diagnosis not present

## 2015-08-28 DIAGNOSIS — E1159 Type 2 diabetes mellitus with other circulatory complications: Secondary | ICD-10-CM | POA: Diagnosis not present

## 2015-08-28 DIAGNOSIS — B961 Klebsiella pneumoniae [K. pneumoniae] as the cause of diseases classified elsewhere: Secondary | ICD-10-CM | POA: Diagnosis not present

## 2015-08-28 DIAGNOSIS — Z89511 Acquired absence of right leg below knee: Secondary | ICD-10-CM | POA: Diagnosis not present

## 2015-08-28 DIAGNOSIS — K219 Gastro-esophageal reflux disease without esophagitis: Secondary | ICD-10-CM | POA: Diagnosis not present

## 2015-08-28 DIAGNOSIS — A09 Infectious gastroenteritis and colitis, unspecified: Secondary | ICD-10-CM | POA: Diagnosis not present

## 2015-08-28 DIAGNOSIS — N2581 Secondary hyperparathyroidism of renal origin: Secondary | ICD-10-CM | POA: Diagnosis not present

## 2015-08-28 DIAGNOSIS — R6521 Severe sepsis with septic shock: Secondary | ICD-10-CM | POA: Diagnosis not present

## 2015-08-28 DIAGNOSIS — E1122 Type 2 diabetes mellitus with diabetic chronic kidney disease: Secondary | ICD-10-CM | POA: Diagnosis not present

## 2015-08-28 DIAGNOSIS — K521 Toxic gastroenteritis and colitis: Secondary | ICD-10-CM

## 2015-08-28 DIAGNOSIS — I120 Hypertensive chronic kidney disease with stage 5 chronic kidney disease or end stage renal disease: Secondary | ICD-10-CM | POA: Diagnosis not present

## 2015-08-28 DIAGNOSIS — E43 Unspecified severe protein-calorie malnutrition: Secondary | ICD-10-CM | POA: Diagnosis not present

## 2015-08-28 DIAGNOSIS — Z8711 Personal history of peptic ulcer disease: Secondary | ICD-10-CM | POA: Diagnosis not present

## 2015-08-28 DIAGNOSIS — Z79899 Other long term (current) drug therapy: Secondary | ICD-10-CM | POA: Diagnosis not present

## 2015-08-28 DIAGNOSIS — M109 Gout, unspecified: Secondary | ICD-10-CM | POA: Diagnosis not present

## 2015-08-28 DIAGNOSIS — R3129 Other microscopic hematuria: Secondary | ICD-10-CM | POA: Diagnosis not present

## 2015-08-28 DIAGNOSIS — J45909 Unspecified asthma, uncomplicated: Secondary | ICD-10-CM | POA: Diagnosis not present

## 2015-08-28 DIAGNOSIS — I2510 Atherosclerotic heart disease of native coronary artery without angina pectoris: Secondary | ICD-10-CM | POA: Diagnosis not present

## 2015-08-28 DIAGNOSIS — Z87891 Personal history of nicotine dependence: Secondary | ICD-10-CM | POA: Diagnosis not present

## 2015-08-28 DIAGNOSIS — Z951 Presence of aortocoronary bypass graft: Secondary | ICD-10-CM | POA: Diagnosis not present

## 2015-08-28 DIAGNOSIS — N186 End stage renal disease: Secondary | ICD-10-CM

## 2015-08-28 DIAGNOSIS — E1151 Type 2 diabetes mellitus with diabetic peripheral angiopathy without gangrene: Secondary | ICD-10-CM | POA: Diagnosis not present

## 2015-08-28 DIAGNOSIS — M1990 Unspecified osteoarthritis, unspecified site: Secondary | ICD-10-CM | POA: Diagnosis not present

## 2015-08-28 DIAGNOSIS — I10 Essential (primary) hypertension: Secondary | ICD-10-CM | POA: Diagnosis not present

## 2015-08-28 DIAGNOSIS — N390 Urinary tract infection, site not specified: Secondary | ICD-10-CM | POA: Diagnosis not present

## 2015-08-28 DIAGNOSIS — Z7982 Long term (current) use of aspirin: Secondary | ICD-10-CM | POA: Diagnosis not present

## 2015-08-28 DIAGNOSIS — Z993 Dependence on wheelchair: Secondary | ICD-10-CM | POA: Diagnosis not present

## 2015-08-28 DIAGNOSIS — E7800 Pure hypercholesterolemia, unspecified: Secondary | ICD-10-CM | POA: Diagnosis not present

## 2015-08-28 DIAGNOSIS — Z8673 Personal history of transient ischemic attack (TIA), and cerebral infarction without residual deficits: Secondary | ICD-10-CM | POA: Diagnosis not present

## 2015-08-28 DIAGNOSIS — R911 Solitary pulmonary nodule: Secondary | ICD-10-CM | POA: Diagnosis not present

## 2015-08-28 DIAGNOSIS — E871 Hypo-osmolality and hyponatremia: Secondary | ICD-10-CM | POA: Diagnosis not present

## 2015-08-28 DIAGNOSIS — Z89512 Acquired absence of left leg below knee: Secondary | ICD-10-CM | POA: Diagnosis not present

## 2015-08-28 DIAGNOSIS — E872 Acidosis: Secondary | ICD-10-CM | POA: Diagnosis not present

## 2015-08-28 DIAGNOSIS — Z794 Long term (current) use of insulin: Secondary | ICD-10-CM | POA: Diagnosis not present

## 2015-08-28 DIAGNOSIS — D638 Anemia in other chronic diseases classified elsewhere: Secondary | ICD-10-CM | POA: Diagnosis not present

## 2015-08-28 LAB — LACTIC ACID, PLASMA
LACTIC ACID, VENOUS: 3.3 mmol/L — AB (ref 0.5–1.9)
Lactic Acid, Venous: 5.8 mmol/L (ref 0.5–1.9)

## 2015-08-28 LAB — MAGNESIUM: MAGNESIUM: 2.4 mg/dL (ref 1.7–2.4)

## 2015-08-28 LAB — TROPONIN I
Troponin I: <0.03 ng/mL (ref <0.03)
Troponin I: 0.03 ng/mL (ref <0.03)

## 2015-08-28 LAB — RENAL FUNCTION PANEL
ANION GAP: 14 (ref 5–15)
Albumin: 2 g/dL — ABNORMAL LOW (ref 3.5–5.0)
BUN: 52 mg/dL — ABNORMAL HIGH (ref 6–20)
CALCIUM: 7.1 mg/dL — AB (ref 8.9–10.3)
CO2: 16 mmol/L — AB (ref 22–32)
CREATININE: 5.31 mg/dL — AB (ref 0.61–1.24)
Chloride: 105 mmol/L (ref 101–111)
GFR, EST AFRICAN AMERICAN: 11 mL/min — AB (ref >60)
GFR, EST NON AFRICAN AMERICAN: 9 mL/min — AB (ref >60)
Glucose, Bld: 114 mg/dL — ABNORMAL HIGH (ref 65–99)
PHOSPHORUS: 4.4 mg/dL (ref 2.5–4.6)
Potassium: 3.9 mmol/L (ref 3.5–5.1)
SODIUM: 135 mmol/L (ref 135–145)

## 2015-08-28 LAB — I-STAT CG4 LACTIC ACID, ED
LACTIC ACID, VENOUS: 6.74 mmol/L — AB (ref 0.5–1.9)
Lactic Acid, Venous: 5.23 mmol/L (ref 0.5–1.9)

## 2015-08-28 LAB — GLUCOSE, CAPILLARY
GLUCOSE-CAPILLARY: 107 mg/dL — AB (ref 65–99)
GLUCOSE-CAPILLARY: 112 mg/dL — AB (ref 65–99)
Glucose-Capillary: 131 mg/dL — ABNORMAL HIGH (ref 65–99)
Glucose-Capillary: 130 mg/dL — ABNORMAL HIGH (ref 65–99)

## 2015-08-28 LAB — CBG MONITORING, ED: GLUCOSE-CAPILLARY: 141 mg/dL — AB (ref 65–99)

## 2015-08-28 LAB — CBC
HCT: 35.5 % — ABNORMAL LOW (ref 39.0–52.0)
HEMOGLOBIN: 11.4 g/dL — AB (ref 13.0–17.0)
MCH: 30.2 pg (ref 26.0–34.0)
MCHC: 32.1 g/dL (ref 30.0–36.0)
MCV: 93.9 fL (ref 78.0–100.0)
PLATELETS: 217 10*3/uL (ref 150–400)
RBC: 3.78 MIL/uL — AB (ref 4.22–5.81)
RDW: 14.3 % (ref 11.5–15.5)
WBC: 10.9 10*3/uL — AB (ref 4.0–10.5)

## 2015-08-28 LAB — C DIFFICILE QUICK SCREEN W PCR REFLEX
C DIFFICILE (CDIFF) INTERP: NOT DETECTED
C DIFFICILE (CDIFF) TOXIN: NEGATIVE
C DIFFICLE (CDIFF) ANTIGEN: NEGATIVE

## 2015-08-28 LAB — MRSA PCR SCREENING: MRSA by PCR: NEGATIVE

## 2015-08-28 MED ORDER — DOXERCALCIFEROL 4 MCG/2ML IV SOLN
0.5000 ug | INTRAVENOUS | Status: DC
  Administered 2015-08-30 – 2015-09-01 (×2): 0.5 ug via INTRAVENOUS
  Filled 2015-08-28 (×3): qty 2

## 2015-08-28 MED ORDER — FAMOTIDINE IN NACL 20-0.9 MG/50ML-% IV SOLN
20.0000 mg | Freq: Two times a day (BID) | INTRAVENOUS | Status: DC
  Filled 2015-08-28: qty 50

## 2015-08-28 MED ORDER — NOREPINEPHRINE BITARTRATE 1 MG/ML IV SOLN
2.0000 ug/min | INTRAVENOUS | Status: DC
  Filled 2015-08-28: qty 4

## 2015-08-28 MED ORDER — SODIUM CHLORIDE 0.9 % IV BOLUS (SEPSIS)
500.0000 mL | Freq: Once | INTRAVENOUS | Status: AC
  Administered 2015-08-28: 500 mL via INTRAVENOUS

## 2015-08-28 MED ORDER — HEPARIN SODIUM (PORCINE) 5000 UNIT/ML IJ SOLN
5000.0000 [IU] | Freq: Three times a day (TID) | INTRAMUSCULAR | Status: DC
  Administered 2015-08-28 – 2015-09-02 (×17): 5000 [IU] via SUBCUTANEOUS
  Filled 2015-08-28 (×16): qty 1

## 2015-08-28 MED ORDER — INSULIN ASPART 100 UNIT/ML ~~LOC~~ SOLN
0.0000 [IU] | Freq: Three times a day (TID) | SUBCUTANEOUS | Status: DC
  Administered 2015-08-28 – 2015-09-02 (×5): 1 [IU] via SUBCUTANEOUS
  Administered 2015-09-02: 2 [IU] via SUBCUTANEOUS

## 2015-08-28 MED ORDER — PHENYLEPHRINE HCL 10 MG/ML IJ SOLN
0.0000 ug/min | INTRAVENOUS | Status: DC
  Administered 2015-08-28: 40 ug/min via INTRAVENOUS
  Administered 2015-08-28: 20 ug/min via INTRAVENOUS
  Administered 2015-08-28 – 2015-08-29 (×4): 40 ug/min via INTRAVENOUS
  Administered 2015-08-29: 30 ug/min via INTRAVENOUS
  Filled 2015-08-28 (×9): qty 1

## 2015-08-28 MED ORDER — FENTANYL CITRATE (PF) 100 MCG/2ML IJ SOLN
INTRAMUSCULAR | Status: AC
  Filled 2015-08-28: qty 2

## 2015-08-28 MED ORDER — SODIUM CHLORIDE 0.9 % IV BOLUS (SEPSIS): 250.0000 mL | Freq: Once | INTRAVENOUS | Status: DC

## 2015-08-28 MED ORDER — PHENYLEPHRINE HCL 10 MG/ML IJ SOLN
INTRAMUSCULAR | Status: AC
  Filled 2015-08-28: qty 1

## 2015-08-28 MED ORDER — FENTANYL CITRATE (PF) 100 MCG/2ML IJ SOLN
25.0000 ug | INTRAMUSCULAR | Status: DC | PRN
  Administered 2015-08-28 – 2015-08-29 (×2): 50 ug via INTRAVENOUS
  Filled 2015-08-28: qty 2

## 2015-08-28 MED ORDER — FAMOTIDINE IN NACL 20-0.9 MG/50ML-% IV SOLN
20.0000 mg | INTRAVENOUS | Status: DC
  Administered 2015-08-28 – 2015-08-29 (×2): 20 mg via INTRAVENOUS
  Filled 2015-08-28 (×2): qty 50

## 2015-08-28 MED ORDER — VANCOMYCIN 50 MG/ML ORAL SOLUTION
500.0000 mg | Freq: Four times a day (QID) | ORAL | Status: DC
  Administered 2015-08-28: 500 mg via ORAL
  Filled 2015-08-28 (×2): qty 10

## 2015-08-28 MED ORDER — SODIUM CHLORIDE 0.9 % IV SOLN: 250.0000 mL | INTRAVENOUS | Status: DC | PRN

## 2015-08-28 MED ORDER — ONDANSETRON HCL 4 MG/2ML IJ SOLN
4.0000 mg | Freq: Four times a day (QID) | INTRAMUSCULAR | Status: DC | PRN
  Administered 2015-08-28: 4 mg via INTRAVENOUS
  Filled 2015-08-28: qty 2

## 2015-08-28 NOTE — Progress Notes (Signed)
CRITICAL VALUE ALERT  Critical value received:  Troponin 0.03  Date of notification:  08/28/2015  Time of notification:  1925  Critical value read back:Yes.    Nurse who received alert:  Lauretta Grill  MD notified (1st page):  Dr. Nelda Marseille  Time of first page:  1946  MD notified (2nd page):  Time of second page:  Responding MD:  Dr Nelda Marseille  Time MD responded:  1946

## 2015-08-28 NOTE — Progress Notes (Signed)
CRITICAL VALUE ALERT  Critical value received:  Latic acid 5.8  Date of notification:  08/28/15  Time of notification:  [id number]  Critical value read back:Yes.    Nurse who received alert:  Allegra Grana, RN  MD notified (1st page):  CCM Resident  Time of first page:  1330  MD notified (2nd page):  Time of second page:  Responding MD:  CCM Resident  Time MD responded:  1330

## 2015-08-28 NOTE — ED Notes (Signed)
Pt stating his pain is in the LLQ of his abdomen (not right). The pt states that he has not had a BM since 08-12-15 the day he was admitted to [facility] for a UTI.  Per wife, pt took a stool softener with no results twice after being discharged but none since.

## 2015-08-28 NOTE — H&P (Signed)
PULMONARY / CRITICAL CARE MEDICINE   Name: Alan [name] MRN: [id number] DOB: 10/03/1940    ADMISSION DATE:  08/27/2015 CONSULTATION DATE:  08/27/2015   REFERRING MD:  Regenia Skeeter  CHIEF COMPLAINT:  Belly pain  HISTORY OF PRESENT ILLNESS:   This is a pleasant 75 y/o male with peripheral arterial disease and ESRD who presented [facility] on 7/9 with sharp right sided abdominal pain.  He says that the pain came on suddenly on 7/9 and feels similar to multiple prior bladder infections he has experienced in the past.  He had some non-bloody vomitous with this, no diarrhea or hematochezia.  In the ER he was noted to have hypotension so was given IVF and antibiotics and was transferred to [facility] for further management.  He notes a cough only after vomiting this evening, but no dyspnea.  He denies sick contacts.  He still has some cramping abdominal pain.  In route here he had a large bowel movement.  PAST MEDICAL HISTORY :  He  has a past medical history of Diabetes mellitus; Hypertension; Gout; Coronary artery disease (2000); Stroke [facility]); Asthma; Hypercholesteremia; Arthritis; DDD (degenerative disc disease), lumbar; DDD (degenerative disc disease), cervical; Gangrene of toe ([place]) (Feb. 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  PAST SURGICAL HISTORY: He  has past surgical history that includes Coronary artery bypass graft (1996); Below knee leg amputation (Bilateral); Esophagogastroduodenoscopy (Left, 05/24/2012); Amputation (Right, 04/10/2013); Appendectomy; Cardiac catheterization (N/A, 08/09/2014); Cardiac catheterization (Left, 08/09/2014); and Cholecystectomy.  Allergies  Allergen Reactions  . Codeine Itching and Nausea And Vomiting  . Yellow Jacket Venom [Bee Venom] Swelling    No current  facility-administered medications on file prior to encounter.   Current Outpatient Prescriptions on File Prior to Encounter  Medication Sig  . acetaminophen (TYLENOL) 500 MG tablet Take 500 mg by mouth 2 (two) times daily.  . Amino Acid Infusion (PROSOL) 20 % SOLN every Monday, Wednesday, and Friday.   Marland Kitchen amLODipine (NORVASC) 5 MG tablet Take 1 tablet (5 mg total) by mouth daily.  Marland Kitchen aspirin EC 81 MG tablet Take 81 mg by mouth at bedtime.   . B Complex-C-Folic Acid (NEPHRO-VITE PO) Take 1 tablet by mouth every evening.   . calcium acetate (PHOSLO) 667 MG capsule Take 1 capsule (667 mg total) by mouth 3 (three) times daily with meals.  . furosemide (LASIX) 40 MG tablet TAKE 1/2 TABLET BY MOUTH EVERY EVENING  . LANTUS SOLOSTAR 100 UNIT/ML Solostar Pen Inject 8 Units into the skin every evening.   Marland Kitchen lisinopril (PRINIVIL,ZESTRIL) 5 MG tablet Take 1 tablet (5 mg total) by mouth daily.  Marland Kitchen omeprazole (PRILOSEC) 20 MG capsule Take 1 capsule (20 mg total) by mouth 2 (two) times daily before a meal.  . ondansetron (ZOFRAN) 4 MG tablet Take 1 tablet (4 mg total) by mouth every 8 (eight) hours as needed for nausea or vomiting.  . cefUROXime (CEFTIN) 500 MG tablet Take 1 tablet (500 mg total) by mouth 2 (two) times daily with a meal. Antibiotic to be taken for 4 more days. (Patient not taking: Reported on 08/27/2015)  . nitroGLYCERIN (NITROSTAT) 0.4 MG SL tablet Place 1 tablet (0.4 mg total) under the tongue every 5 (five) minutes as needed for chest pain.    FAMILY HISTORY:  His has no family status information on file.   SOCIAL HISTORY:  He  reports that he quit smoking about 11 years ago. He has never used smokeless tobacco. He reports that he does not drink alcohol or use illicit drugs.  REVIEW OF SYSTEMS:   Gen: Denies fever, chills, weight change, fatigue, night sweats HEENT: Denies blurred vision, double vision, hearing loss, tinnitus, sinus congestion, rhinorrhea, sore throat, neck stiffness,  dysphagia PULM: Denies shortness of breath, + cough, sputum production, hemoptysis, wheezing CV: Denies chest pain, edema, orthopnea, paroxysmal nocturnal dyspnea, palpitations GI: per HPI GU: Denies dysuria, hematuria, polyuria, oliguria, urethral discharge Endocrine: Denies hot or cold intolerance, polyuria, polyphagia or appetite change Derm: Denies rash, dry skin, scaling or peeling skin change Heme: Denies easy bruising, bleeding, bleeding gums Neuro: Denies headache, numbness, weakness, slurred speech, loss of memory or consciousness   SUBJECTIVE:  As above  VITAL SIGNS: BP 96/70 mmHg  Pulse 86  Temp(Src) 97.5 F (36.4 C) (Oral)  Resp 19  Wt 153 lb (69.4 kg)  SpO2 96%  HEMODYNAMICS:    VENTILATOR SETTINGS:    INTAKE / OUTPUT:    PHYSICAL EXAMINATION: General:  Awake, alert, no distress Neuro:  Alert and oriented, moves R side OK, some left sided weakness (chronic) HEENT:  NCAT, OP clear, MMM dry Cardiovascular:  RRR, no mgr, pulses intact R radial Lungs:  CTA B, normal effort Abdomen:  Mild tenderness to deep palpation left side, no guarding or rebound tenderness, no mass, BS+ Musculoskeletal:  Normal bulk and tone, s/p bilateral BKA Skin:  No rash or skin breakdown, no mottling  LABS:  BMET  Recent Labs Lab 08/27/15 2054  NA 135  K 3.7  CL 98*  CO2 26  BUN 44*  CREATININE 4.98*  GLUCOSE 143*    Electrolytes  Recent Labs Lab 08/27/15 2054  CALCIUM 8.0*    CBC  Recent Labs Lab 08/27/15 2054  WBC 15.8*  HGB 13.4  HCT 41.1  PLT 234    Coag's  Recent Labs Lab 08/27/15 2054  INR 1.07    Sepsis Markers  Recent Labs Lab 08/27/15 2111 08/28/15 0028 08/28/15 0329  LATICACIDVEN 4.46* 5.23* 6.74*    ABG No results for input(s): PHART, PCO2ART, PO2ART in the last 168 hours.  Liver Enzymes  Recent Labs Lab 08/27/15 2054  AST 26  ALT 16*  ALKPHOS 208*  BILITOT 1.2  ALBUMIN 2.7*    Cardiac Enzymes No results for  input(s): TROPONINI, PROBNP in the last 168 hours.  Glucose  Recent Labs Lab 08/28/15 0441  GLUCAP 141*    Imaging Dg Chest Portable 1 View  08/27/2015  CLINICAL DATA:  Gradually worsening chest pain, left-sided, beginning earlier today. EXAM: PORTABLE CHEST 1 VIEW COMPARISON:  08/12/2015.  05/12/2015. FINDINGS: Poor inspiration. Previous CABG. Chronic elevation of the hemidiaphragms with chronic scarring at the left lung base. No evidence of any active process or change. No edema. No effusions. IMPRESSION: Previous CABG.  Poor inspiration.  Chronic left lung scarring. Electronically Signed   By: Nelson Chimes M.D.   On: 08/27/2015 21:18   Ct Renal Stone Study  08/28/2015  CLINICAL DATA:  Left flank pain beginning this afternoon and gradually worsening. Sepsis. EXAM: CT ABDOMEN AND PELVIS WITHOUT CONTRAST TECHNIQUE: Multidetector CT imaging of the abdomen and pelvis was performed following the standard protocol without IV contrast. COMPARISON:  04/12/2015 FINDINGS: Dependent atelectasis and fibrosis in the lung bases. Right lung base nodule measuring 10 mm diameter. This was not seen on the prior study but may not have been included within  the field of view. The nodule is noncalcified and indeterminate. Consider one of the following in 3 months for both low-risk and high-risk individuals: (a) repeat chest CT, (b) follow-up PET-CT, or (c) tissue sampling. This recommendation follows the consensus statement: Guidelines for Management of Incidental Pulmonary Nodules Detected on CT Images:From the Fleischner Society 2017; published online before print (10.1148/radiol.IJ:2314499). Scattered calcified granulomas in the liver. Surgical absence of the gallbladder. No bile duct dilatation. The unenhanced appearance of the pancreas, spleen, adrenal glands, kidneys, inferior vena cava, and abdominal aorta is unremarkable. Small esophageal hiatal hernia. Small bowel are nondistended. There is diffuse dilatation of  the colon, filled with stool and fluid. This could represent changes due to diarrhea or constipation. There is stranding in the pericolonic fat around the descending colon which may also indicate colitis. No free air or free fluid in the abdomen. Subxiphoid hernias containing fat. Pelvis: Appendix is not identified. Prostate gland is enlarged, measuring about 4.7 cm diameter. No free fluid in the pelvis. No significant pelvic lymphadenopathy. Postoperative changes in the anterior abdominal wall. Diffuse degenerative change throughout the thoracic and lumbar spine with thoracic and lumbar scoliosis. Severe degenerative changes in the right hip possibly indicating right hip dysplasia. IMPRESSION: 10 mm right lung base nodule. See above recommendations for follow-up. Diffusely distended colon which is filled with stool and fluid. Pericolonic fatty infiltration around the descending region. Consider constipation, diarrhea, and/or colitis. Electronically Signed   By: Lucienne Capers M.D.   On: 08/28/2015 01:17     STUDIES:  7/10 CT abdomen> diffusely distended colon filled with stool and fluid, lung nodules, likely colitis  CULTURES: 7/9 blood >> 7/9 urine >> 7/10 c diff >   ANTIBIOTICS: 7/10 oral vanc >  7/10 zosyn >   SIGNIFICANT EVENTS:   LINES/TUBES:   DISCUSSION: 75 y/o male with a past medical history of ESRD and atherosclerosis presented to [facility] on 7/10 on transfer from [facility] with lactic acidosis in the setting of septic shock from colitis.  DDx of his colitis is infectious vs less likely inflammatory or possibly ischemic given his known atherosclerosis.  Currently shock is improving.  ASSESSMENT / PLAN:  PULMONARY A: No acute issues Pulmonary nodule P:   Nodule Satoru need f/u after discharge  CARDIOVASCULAR A:  Septic shock > currently volume replete Peripheral arterial disease Known CAD History of stroke P:  Tele monitoring Continue neosynephrine Hold off on further  IVF for now  RENAL A:   ERSD P:   Consult renal 7/10  GASTROINTESTINAL A:   Colitis P:   Lactic acid now, if still elevated then consult surgery for possible ischemic colitis NPO  HEMATOLOGIC A:   No acute issues P:  Monitor for bleeding Sub q heparin for dvt prophylaxis  INFECTIOUS A:   Colitis> acute infectious could be c diff, he reports recent antibiotics P:   Zosyn Oral vanc F/u cultures  ENDOCRINE A:   DM2   P:   SSI Accuchecks  NEUROLOGIC A:   No acute issues P:   Fentanyl prn pain   FAMILY  - Updates: none bedside  - Inter-disciplinary family meet or Palliative Care meeting due by:  day 7  My cc time 35 minutes  Roselie Awkward, MD [place] [facility] Pager: [phone] Cell: [phone] After 3pm or if no response, call [phone]  08/28/2015, 5:53 AM

## 2015-08-28 NOTE — ED Notes (Signed)
Pt had an episode of diarrhea. Pt was able to tell me that he needed the bedpan.

## 2015-08-28 NOTE — ED Notes (Signed)
Pt has had 2 formed stools within the last 2 hours and pt has had to be cleaned up. Pt now having intermittent loose stools with some formed stools as well. Pt at times can tell you that he needs the bed pan and other times he has an episode of diarrhea and is unable to hold it.

## 2015-08-28 NOTE — Progress Notes (Signed)
Pharmacy Antibiotic Note  Jowel Amadio is a 75 y.o. male admitted on 08/27/2015 with abdominal pain.  Pharmacy has been consulted for Zosyn dosing. WBC is elevated. Pt has ESRD on HD.   Plan: -Zosyn 2.25g IV q8h -PO vancomycin per MD -Trend WBC, temp, renal function -F/U infectious work-up  Weight: 161 lb 6 oz (73.2 kg)  Temp (24hrs), Avg:98 F (36.7 C), Min:97.5 F (36.4 C), Max:98.8 F (37.1 C)   Recent Labs Lab 08/27/15 2054 08/27/15 2111 08/28/15 0028 08/28/15 0329  WBC 15.8*  --   --   --   CREATININE 4.98*  --   --   --   LATICACIDVEN  --  4.46* 5.23* 6.74*    Estimated Creatinine Clearance: 12.6 mL/min (by C-G formula based on Cr of 4.98).    Allergies  Allergen Reactions  . Codeine Itching and Nausea And Vomiting  . Yellow Jacket Venom [Bee Venom] Swelling    Narda Bonds 08/28/2015 6:36 AM

## 2015-08-28 NOTE — Progress Notes (Addendum)
eLink Physician-Brief Progress Note Patient Name: Alan [name] DOB: 06/05/1940 MRN: [id number]   Date of Service  08/28/2015  HPI/Events of Note  Multiple issues: 1. Lactic Acid = 4.46 >> 5.23 >> 6.74 and 2. Patient c/o pain. Hypotension from previous Dilaudid dose. LVEF = 60% to 65%.  eICU Interventions  Jibreel order: 1. Bolus with 0.9 NaCl 1 liter IV over 1 hour now. 2. D/C Dilaudid.  3. Fentanyl 25-50 mcg IV Q 2 hours PRN pain.      Intervention Category Major Interventions: Acid-Base disturbance - evaluation and management Intermediate Interventions: Pain - evaluation and management  Sommer,Steven Eugene 08/28/2015, 4:02 AM

## 2015-08-28 NOTE — Progress Notes (Signed)
Notified of critical lab value by Sharyn Lull, Therapist, sports. Lactic acid 5.8, improved from previous value of 6.74.   - Adrean continue to trend LA q3hrs - If LA increases, Brodee consult surgery for possible ischemic colitis. Holding off on consult for now, as we would not expect LA to improve if Pt had ongoing ischemic colitis.  Hyman Bible, MD PGY-2

## 2015-08-28 NOTE — Consult Note (Signed)
Renal Service Consult Note [facility]  [facility] 08/28/2015 [place] D Requesting Physician:  Dr Lake Bells  Reason for Consult:  ESRD pt with  HPI: The patient is a 75 y.o. year-old with history of ESRD, CAD, PUD, PVD w bilat LE amp. Recent admit last month with Klebs pyelonephritis.  Here now with L sided abd pain/ flank pain with nausea/ vomiting and frequent diarrhea.  No fevers or chills.  Admitted to ICU for low BP , on neo gtt.    Good historian.  Lives with his wife in [place], [place].  Father was farmer and worked at the [employer], died in his 26's.  Mother died in her 28's.  He has one brother died in car accident and 3 sisters living.  4 children, the youngest lives with them. No etoh / tob.  Is wheelchair dependent. They don't go out much, does bible study in his home on Saturdays w [employer] members that come by.   Gets HD in [place], MWF , x 2 years, left arm AVF, no recent issues w HD.  Missed last week on Monday.    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 Diabetes mellitus   . Hypertension   . Gout   . Coronary artery disease 2000    Stents [facility]  . Stroke ([place])   . Asthma   . Hypercholesteremia   . Arthritis   . DDD (degenerative disc disease), lumbar   . DDD (degenerative disc disease), cervical   . Gangrene of toe ([place]) Feb. 2015    Right  great     . Gastric ulcer     EGD 2014  . 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 Clotted renal dialysis arteriovenous graft ([place])   . CVA (cerebral infarction)   . ESRD (end stage renal disease) on dialysis ([place])   . 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   Past Surgical History  Past Surgical History  Procedure Laterality Date  . Coronary artery bypass graft  1996    [facility]  . Below knee leg amputation Bilateral   . Esophagogastroduodenoscopy Left 05/24/2012    [id number] dilated baggy but otherwise a normal/ Small hiatal hernia. Gastric ulcer -S/P biopsy  . Amputation Right 04/10/2013    Procedure: Right Below Knee Amputation;  Surgeon: Newt Minion, MD;  Location: [facility];  Service: Orthopedics;  Laterality: Right;  Right Below Knee Amputation  . Appendectomy    . Peripheral vascular catheterization N/A 08/09/2014    Procedure: A/V Shuntogram/Fistulagram;  Surgeon: Katha Cabal, MD;  Location: [facility];  Service: Cardiovascular;  Laterality: N/A;  . Peripheral vascular catheterization Left 08/09/2014    Procedure: A/V Shunt Intervention;  Surgeon: Katha Cabal, MD;  Location: [facility];  Service:  Cardiovascular;  Laterality: Left;  . Cholecystectomy     Family History  Family History  Problem Relation Age of Onset  . Colon cancer Neg Hx   . Cancer Mother    Social History  reports that he quit smoking about 11 years ago. He has never used smokeless tobacco. He reports that he does not drink alcohol or use illicit drugs. Allergies  Allergies  Allergen Reactions  . Codeine Itching and Nausea And Vomiting  . Yellow Jacket Venom [Bee Venom] Swelling   Home medications Prior to Admission medications   Medication Sig Start Date End Date Taking? Authorizing Provider  acetaminophen (TYLENOL) 500 MG tablet Take 500 mg by mouth 2 (two) times daily.   Yes Historical Provider, MD  Amino Acid Infusion (PROSOL) 20 % SOLN every Monday, Wednesday,  and Friday.  10/27/14  Yes Historical Provider, MD  amLODipine (NORVASC) 5 MG tablet Take 1 tablet (5 mg total) by mouth daily. 08/15/15  Yes Rexene Alberts, MD  aspirin EC 81 MG tablet Take 81 mg by mouth at bedtime.    Yes Historical Provider, MD  B Complex-C-Folic Acid (NEPHRO-VITE PO) Take 1 tablet by mouth every evening.    Yes Historical Provider, MD  budesonide-formoterol (SYMBICORT) 160-4.5 MCG/ACT inhaler Inhale 2 puffs into the lungs 2 (two) times daily.   Yes Historical Provider, MD  calcium acetate (PHOSLO) 667 MG capsule Take 1 capsule (667 mg total) by mouth 3 (three) times daily with meals. 08/15/15  Yes Rexene Alberts, MD  cinacalcet (SENSIPAR) 30 MG tablet Take 30 mg by mouth daily.   Yes Historical Provider, MD  Coenzyme Q10 (CO Q-10) 50 MG CAPS Take 1 capsule by mouth daily.   Yes Historical Provider, MD  furosemide (LASIX) 40 MG tablet TAKE 1/2 TABLET BY MOUTH EVERY EVENING 05/22/15  Yes Lendon Colonel, NP  LANTUS SOLOSTAR 100 UNIT/ML Solostar Pen Inject 8 Units into the skin every evening.  12/21/13  Yes Historical Provider, MD  lisinopril (PRINIVIL,ZESTRIL) 5 MG tablet Take 1 tablet (5 mg total) by mouth daily. 08/15/15  Yes Rexene Alberts, MD  omeprazole (PRILOSEC) 20 MG capsule Take 1 capsule (20 mg total) by mouth 2 (two) times daily before a meal. 06/17/13  Yes Lezlie Octave Black, NP  ondansetron (ZOFRAN) 4 MG tablet Take 1 tablet (4 mg total) by mouth every 8 (eight) hours as needed for nausea or vomiting. 08/15/15  Yes Rexene Alberts, MD  cefUROXime (CEFTIN) 500 MG tablet Take 1 tablet (500 mg total) by mouth 2 (two) times daily with a meal. Antibiotic to be taken for 4 more days. Patient not taking: Reported on 08/27/2015 08/15/15   Rexene Alberts, MD  nitroGLYCERIN (NITROSTAT) 0.4 MG SL tablet Place 1 tablet (0.4 mg total) under the tongue every 5 (five) minutes as needed for chest pain. 11/18/14   Samuella Cota, MD   Liver Function Tests  Recent Labs Lab 08/27/15 2054  AST 26   ALT 16*  ALKPHOS 208*  BILITOT 1.2  PROT 6.5  ALBUMIN 2.7*    Recent Labs Lab 08/27/15 2054  LIPASE 22   CBC  Recent Labs Lab 08/27/15 2054  WBC 15.8*  NEUTROABS 13.0*  HGB 13.4  HCT 41.1  MCV 94.3  PLT [id number]   Basic Metabolic Panel  Recent Labs Lab 08/27/15 2054  NA 135  K 3.7  CL 98*  CO2 26  GLUCOSE 143*  BUN 44*  CREATININE 4.98*  CALCIUM 8.0*   Iron/TIBC/Ferritin/ %Sat  Component Value Date/Time   IRON 121 08/14/2015 0619   TIBC 203* 08/14/2015 0619   FERRITIN 380* 05/20/2012 0527   IRONPCTSAT 60* 08/14/2015 0619    Filed Vitals:   08/28/15 0715 08/28/15 0730 08/28/15 0745 08/28/15 0816  BP: 92/62 111/67 109/54   Pulse: 82 79 80   Temp:    98.7 F (37.1 C)  TempSrc:    Oral  Resp: 20 17 13    Weight:      SpO2: 95% 94% 95%    Exam Gen alert, no distress No rash, cyanosis or gangrene Sclera anicteric, throat clear  No jvd or bruits Chest clear bilat RRR no MRG Abd soft, diffuse mild tenderness,+bs, no mass or ascites GU normal male MS bilat LE amputee, no edema Ext no wounds or ulcers Neuro is alert, Ox 3 , nf, gen weakness   UA + pyuria, bacteriuria  Dialysis: [facility] MWF  4h  73kg   Hep 1000 + 1K/ hr   LFA AVF  35 deg (helps w hypotension) EPO 2400 tiw, Hect 0.5 tiw  Assessment: 1.  Diarrhea / nausea/ vomiting/ abd pain - w/u in progress.  BP's low on pressors now. WBC ^, afeb.  2.  Pyuria / recent Klebs pyelo - cx pend 3.  ESRD on HD 4.  Hypotension on pressors 5.  HTN norvasc/ lisinopril on hold 6.  PVD / bilat LE amp 7.  IDDM 8.  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  Hx CVA x 2 10. Vol euvolemic , at dry wt   Plan - hold HD today, HD tomorrow  Kelly Splinter MD [place] pager [phone]    cell [phone] 08/28/2015, 11:36 AM

## 2015-08-29 LAB — PHOSPHORUS: Phosphorus: 4.7 mg/dL — ABNORMAL HIGH (ref 2.5–4.6)

## 2015-08-29 LAB — BASIC METABOLIC PANEL
ANION GAP: 14 (ref 5–15)
BUN: 60 mg/dL — ABNORMAL HIGH (ref 6–20)
CALCIUM: 7 mg/dL — AB (ref 8.9–10.3)
CHLORIDE: 100 mmol/L — AB (ref 101–111)
CO2: 17 mmol/L — AB (ref 22–32)
Creatinine, Ser: 5.44 mg/dL — ABNORMAL HIGH (ref 0.61–1.24)
GFR calc Af Amer: 11 mL/min — ABNORMAL LOW (ref >60)
GFR calc non Af Amer: 9 mL/min — ABNORMAL LOW (ref >60)
GLUCOSE: 120 mg/dL — AB (ref 65–99)
POTASSIUM: 4 mmol/L (ref 3.5–5.1)
Sodium: 131 mmol/L — ABNORMAL LOW (ref 135–145)

## 2015-08-29 LAB — GLUCOSE, CAPILLARY
GLUCOSE-CAPILLARY: 113 mg/dL — AB (ref 65–99)
GLUCOSE-CAPILLARY: 82 mg/dL (ref 65–99)
Glucose-Capillary: 115 mg/dL — ABNORMAL HIGH (ref 65–99)
Glucose-Capillary: 101 mg/dL — ABNORMAL HIGH (ref 65–99)
Glucose-Capillary: 107 mg/dL — ABNORMAL HIGH (ref 65–99)
Glucose-Capillary: 64 mg/dL — ABNORMAL LOW (ref 65–99)

## 2015-08-29 LAB — CBC
HEMATOCRIT: 33.5 % — AB (ref 39.0–52.0)
Hemoglobin: 10.9 g/dL — ABNORMAL LOW (ref 13.0–17.0)
MCH: 30 pg (ref 26.0–34.0)
MCHC: 32.5 g/dL (ref 30.0–36.0)
MCV: 92.3 fL (ref 78.0–100.0)
Platelets: 196 10*3/uL (ref 150–400)
RBC: 3.63 MIL/uL — ABNORMAL LOW (ref 4.22–5.81)
RDW: 14.7 % (ref 11.5–15.5)
WBC: 10.5 10*3/uL (ref 4.0–10.5)

## 2015-08-29 LAB — MAGNESIUM: Magnesium: 2.3 mg/dL (ref 1.7–2.4)

## 2015-08-29 MED ORDER — ALTEPLASE 2 MG IJ SOLR
2.0000 mg | Freq: Once | INTRAMUSCULAR | Status: DC | PRN
  Filled 2015-08-29: qty 2

## 2015-08-29 MED ORDER — SODIUM CHLORIDE 0.9 % IV SOLN: 100.0000 mL | INTRAVENOUS | Status: DC | PRN

## 2015-08-29 MED ORDER — DEXTROSE 50 % IV SOLN
25.0000 mL | Freq: Once | INTRAVENOUS | Status: AC
  Administered 2015-08-29: 25 mL via INTRAVENOUS

## 2015-08-29 MED ORDER — HEPARIN SODIUM (PORCINE) 1000 UNIT/ML DIALYSIS: 2000.0000 [IU] | INTRAMUSCULAR | Status: DC | PRN

## 2015-08-29 MED ORDER — HEPARIN SODIUM (PORCINE) 1000 UNIT/ML DIALYSIS: 1000.0000 [IU] | INTRAMUSCULAR | Status: DC | PRN

## 2015-08-29 MED ORDER — LIDOCAINE-PRILOCAINE 2.5-2.5 % EX CREA
1.0000 "application " | TOPICAL_CREAM | CUTANEOUS | Status: DC | PRN
  Filled 2015-08-29: qty 5

## 2015-08-29 MED ORDER — LIDOCAINE HCL (PF) 1 % IJ SOLN: 5.0000 mL | INTRAMUSCULAR | Status: DC | PRN

## 2015-08-29 MED ORDER — PENTAFLUOROPROP-TETRAFLUOROETH EX AERO: 1.0000 "application " | INHALATION_SPRAY | CUTANEOUS | Status: DC | PRN

## 2015-08-29 MED ORDER — DEXTROSE 50 % IV SOLN
INTRAVENOUS | Status: AC
Start: 2015-08-29 — End: 2015-08-29
  Filled 2015-08-29: qty 50

## 2015-08-29 NOTE — Progress Notes (Signed)
PULMONARY / CRITICAL CARE MEDICINE   Name: Alan [name] MRN: [id number] DOB: 1940/10/31    ADMISSION DATE:  08/27/2015 CONSULTATION DATE:  08/28/15  REFERRING MD:  Regenia Skeeter  CHIEF COMPLAINT:  Abdominal pain  HISTORY OF PRESENT ILLNESS:   This is a pleasant 75 y/o male with peripheral arterial disease and ESRD who [facility] on [facility]/9 with sharp right sided abdominal pain. He says that the pain came on suddenly on 7/9 and feels similar to multiple prior bladder infections he has experienced in the past. He had some non-bloody vomitous with this, no diarrhea or hematochezia. In the ER he was noted to have hypotension so was given IVF and antibiotics and was transferred to [facility] for further management. He notes a cough only after vomiting this evening, but no dyspnea. He denies sick contacts. He still has some cramping abdominal pain. In route here he had a large bowel movement.  SUBJECTIVE:  Pt states he is doing fine this morning. He states his abdominal pain is about the same as yesterday.   VITAL SIGNS: BP 133/65 mmHg  Pulse 75  Temp(Src) 98.8 F (37.1 C) (Oral)  Resp 15  Wt 73.2 kg (161 lb 6 oz)  SpO2 97%  HEMODYNAMICS:    VENTILATOR SETTINGS:    INTAKE / OUTPUT: I/O last 3 completed shifts: In: 3965 [I.V.:3815; IV Piggyback:150] Out: -   PHYSICAL EXAMINATION: General: Awake, alert, no distress Neuro: Alert and oriented, answering questions appropriately, mild left sided weakness HEENT: NCAT, EOMI, moist mucous membranes Cardiovascular: RRR, no mgr Lungs: CTAB, normal work of breathing Abdomen: +BS, soft, non-distended, diffuse tenderness to deep palpation worse in the LLQ, no guarding or rebound tenderness, no mass,  Musculoskeletal: Normal bulk and tone, s/p bilateral BKA Skin: No rash or skin breakdown, no mottling  LABS:  BMET  Recent Labs Lab 08/27/15 2054 08/28/15 1158 08/29/15 0235  NA 135 135 131*  K 3.7 3.9 4.0  CL 98* 105 100*   CO2 26 16* 17*  BUN 44* 52* 60*  CREATININE 4.98* 5.31* 5.44*  GLUCOSE 143* 114* 120*    Electrolytes  Recent Labs Lab 08/27/15 2054 08/28/15 1158 08/29/15 0235  CALCIUM 8.0* 7.1* 7.0*  MG  --  2.4 2.3  PHOS  --  4.4 4.7*    CBC  Recent Labs Lab 08/27/15 2054 08/28/15 1158 08/29/15 0235  WBC 15.8* 10.9* 10.5  HGB 13.4 11.4* 10.9*  HCT 41.1 35.5* 33.5*  PLT 234 217 196    Coag's  Recent Labs Lab 08/27/15 2054  INR 1.07    Sepsis Markers  Recent Labs Lab 08/28/15 0329 08/28/15 1158 08/28/15 1532  LATICACIDVEN 6.74* 5.8* 3.3*    ABG No results for input(s): PHART, PCO2ART, PO2ART in the last 168 hours.  Liver Enzymes  Recent Labs Lab 08/27/15 2054 08/28/15 1158  AST 26  --   ALT 16*  --   ALKPHOS 208*  --   BILITOT 1.2  --   ALBUMIN 2.7* 2.0*    Cardiac Enzymes  Recent Labs Lab 08/28/15 1158 08/28/15 1808  TROPONINI <0.03 0.03*    Glucose  Recent Labs Lab 08/28/15 0441 08/28/15 0608 08/28/15 0807 08/28/15 1211 08/28/15 1526 08/28/15 2358  GLUCAP 141* 131* 130* 107* 112* 115*    Imaging No results found.   STUDIES:  7/10 CT abdomen> diffusely distended colon filled with stool and fluid, lung nodules, likely colitis  CULTURES: 7/9 blood >> 7/9 urine >> 7/10 c diff > negative  ANTIBIOTICS: 7/10 oral vanc > 7/10 7/10 zosyn >   SIGNIFICANT EVENTS:   LINES/TUBES: PIV 7/9 >>   DISCUSSION: 75 y/o male with a past medical history of ESRD and atherosclerosis presented to [facility] on 7/10 on [facility] from [facility] with lactic acidosis in the setting of septic shock from colitis. DDx of his colitis is infectious vs less likely inflammatory or possibly ischemic given his known atherosclerosis. Currently shock is improving.  ASSESSMENT / PLAN:  PULMONARY A: No acute issues Pulmonary nodule P:  Nodule Doyt need f/u after discharge  CARDIOVASCULAR A:  Septic shock- improving.  Peripheral arterial  disease Known CAD History of stroke P:  Telemetry Continue neosynephrine. Wean as tolerated. Hold off on further IVF for now  RENAL A:  ESRD Hyponatremia Microscopic Hematuria P:  Nephrology following, appreciate recommendations. Plan for HD today. Trend electrolytes daily  GASTROINTESTINAL A:  Acute Colitis- likely infectious. P:  Trending lactic acid Start clears today. Advance as tolerated.  HEMATOLOGIC A:  No acute issues P:  Monitor for bleeding Sub q heparin for dvt prophylaxis  INFECTIOUS A:  Acute colitis- likely infectious. P:  Zosyn F/u cultures   ENDOCRINE A:  DM2- last A1c 5.2% (10/2014). P:  Sensitive SSI Accuchecks four times daily  NEUROLOGIC A:  No acute issues P:  Fentanyl prn pain   Hyman Bible, MD Family Medicine, PGY-2  08/29/2015, 7:00 AM  Attending note: Abdominal pain improving.  Tolerate HD this AM.    Alert.  Normal strength.  HR regular.  No wheeze.  Abd soft, mild tenderness.  B/l leg stumps clean.  Hb 10.9, Creatinine 5.44, LA 3.3.  Assessment/plan: Septic shock 2nd to colitis >> improving. - wean pressors to keep MAP > 60, SBP > 90 - continue Abx  ESRD. - HD per nephrology  Lung nodule. - f/u as outpt  Chesley Mires, MD [place] 08/29/2015, 1:18 PM Pager:  [phone] After 3pm call: [phone]

## 2015-08-29 NOTE — Progress Notes (Signed)
  [facility] Progress Note   Subjective: no c/o  Filed Vitals:   08/29/15 1100 08/29/15 1102 08/29/15 1115 08/29/15 1200  BP: 89/55 106/57 102/62   Pulse: 77 77 79   Temp:  98.3 F (36.8 C)  97.6 F (36.4 C)  TempSrc:  Oral  Oral  Resp: 16 18 22    Weight:  72.9 kg (160 lb 11.5 oz)    SpO2: 100% 97% 100%     Inpatient medications: . [START ON 08/30/2015] doxercalciferol  0.5 mcg Intravenous Q M,W,F-HD  . famotidine (PEPCID) IV  20 mg Intravenous Q24H  . heparin  5,000 Units Subcutaneous Q8H  . insulin aspart  0-9 Units Subcutaneous TID WC  . piperacillin-tazobactam (ZOSYN)  IV  2.25 g Intravenous Q8H  . sodium chloride  250 mL Intravenous Once   . phenylephrine (NEO-SYNEPHRINE) Adult infusion 30 mcg/min (08/29/15 1130)   sodium chloride, sodium chloride, sodium chloride, sodium chloride, sodium chloride, alteplase, fentaNYL (SUBLIMAZE) injection, heparin, heparin, [START ON 08/30/2015] heparin, lidocaine (PF), lidocaine-prilocaine, ondansetron (ZOFRAN) IV, pentafluoroprop-tetrafluoroeth  Exam: Gen alert, no distress No rash, cyanosis or gangrene Sclera anicteric, throat clear  No jvd or bruits Chest clear bilat RRR no MRG Abd soft, diffuse mild tenderness,+bs, no mass or ascites GU normal male MS bilat LE amputee, no edema Ext no wounds or ulcers Neuro is alert, Ox 3 , nf, gen weakness   UA + pyuria, bacteriuria  Dialysis: [facility] MWF 4h 73kg Hep 1000 + 1K/ hr LFA AVF 35 deg (helps w hypotension) EPO 2400 tiw, Hect 0.5 tiw  Assessment: 1. N/V/ abd pain - poss UTI, growing >100k GNR on urine cx 2. Recent Klebs pyelo/UTI 3. ESRD on HD 4. Hypotension - still on pressors, weaning 5. HTN norvasc/ lisinopril on hold 6. PVD / bilat LE amp 7. IDDM 8. 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 10. Vol euvolemic , at dry wt  Plan - HD tomorrow, min UF   Kelly Splinter MD [facility] pager [phone]    cell [phone] 08/29/2015, 12:43  PM    Recent Labs Lab 08/27/15 2054 08/28/15 1158 08/29/15 0235  NA 135 135 131*  K 3.7 3.9 4.0  CL 98* 105 100*  CO2 26 16* 17*  GLUCOSE 143* 114* 120*  BUN 44* 52* 60*  CREATININE 4.98* 5.31* 5.44*  CALCIUM 8.0* 7.1* 7.0*  PHOS  --  4.4 4.7*    Recent Labs Lab 08/27/15 2054 08/28/15 1158  AST 26  --   ALT 16*  --   ALKPHOS 208*  --   BILITOT 1.2  --   PROT 6.5  --   ALBUMIN 2.7* 2.0*    Recent Labs Lab 08/27/15 2054 08/28/15 1158 08/29/15 0235  WBC 15.8* 10.9* 10.5  NEUTROABS 13.0*  --   --   HGB 13.4 11.4* 10.9*  HCT 41.1 35.5* 33.5*  MCV 94.3 93.9 92.3  PLT 234 217 196   Iron/TIBC/Ferritin/ %Sat    Component Value Date/Time   IRON 121 08/14/2015 0619   TIBC 203* 08/14/2015 0619   FERRITIN 380* 05/20/2012 0527   IRONPCTSAT 60* 08/14/2015 [id number]

## 2015-08-30 LAB — GLUCOSE, CAPILLARY
GLUCOSE-CAPILLARY: 99 mg/dL (ref 65–99)
GLUCOSE-CAPILLARY: 82 mg/dL (ref 65–99)
Glucose-Capillary: 103 mg/dL — ABNORMAL HIGH (ref 65–99)
Glucose-Capillary: 74 mg/dL (ref 65–99)
Glucose-Capillary: 80 mg/dL (ref 65–99)
Glucose-Capillary: 89 mg/dL (ref 65–99)
Glucose-Capillary: 93 mg/dL (ref 65–99)

## 2015-08-30 LAB — CBC
HCT: 29.1 % — ABNORMAL LOW (ref 39.0–52.0)
HEMOGLOBIN: 9.4 g/dL — AB (ref 13.0–17.0)
MCH: 29.5 pg (ref 26.0–34.0)
MCHC: 32.3 g/dL (ref 30.0–36.0)
MCV: 91.2 fL (ref 78.0–100.0)
PLATELETS: 161 10*3/uL (ref 150–400)
RBC: 3.19 MIL/uL — ABNORMAL LOW (ref 4.22–5.81)
RDW: 14.9 % (ref 11.5–15.5)
WBC: 6.5 10*3/uL (ref 4.0–10.5)

## 2015-08-30 LAB — BASIC METABOLIC PANEL
Anion gap: 8 (ref 5–15)
BUN: 33 mg/dL — AB (ref 6–20)
CALCIUM: 7.2 mg/dL — AB (ref 8.9–10.3)
CHLORIDE: 103 mmol/L (ref 101–111)
CO2: 24 mmol/L (ref 22–32)
CREATININE: 4.15 mg/dL — AB (ref 0.61–1.24)
GFR calc Af Amer: 15 mL/min — ABNORMAL LOW (ref >60)
GFR calc non Af Amer: 13 mL/min — ABNORMAL LOW (ref >60)
Glucose, Bld: 81 mg/dL (ref 65–99)
Potassium: 3.8 mmol/L (ref 3.5–5.1)
SODIUM: 135 mmol/L (ref 135–145)

## 2015-08-30 LAB — URINE CULTURE: Culture: >=100000 — AB

## 2015-08-30 MED ORDER — SODIUM CHLORIDE 0.9 % IV SOLN: 100.0000 mL | INTRAVENOUS | Status: DC | PRN

## 2015-08-30 MED ORDER — CIPROFLOXACIN HCL 500 MG PO TABS
500.0000 mg | ORAL_TABLET | Freq: Every day | ORAL | Status: DC
  Administered 2015-08-30 – 2015-09-02 (×4): 500 mg via ORAL
  Filled 2015-08-30 (×4): qty 1

## 2015-08-30 MED ORDER — HEPARIN SODIUM (PORCINE) 1000 UNIT/ML DIALYSIS: 1000.0000 [IU] | INTRAMUSCULAR | Status: DC | PRN

## 2015-08-30 MED ORDER — LIDOCAINE-PRILOCAINE 2.5-2.5 % EX CREA: 1.0000 "application " | TOPICAL_CREAM | CUTANEOUS | Status: DC | PRN

## 2015-08-30 MED ORDER — PENTAFLUOROPROP-TETRAFLUOROETH EX AERO: 1.0000 "application " | INHALATION_SPRAY | CUTANEOUS | Status: DC | PRN

## 2015-08-30 MED ORDER — ALTEPLASE 2 MG IJ SOLR
2.0000 mg | Freq: Once | INTRAMUSCULAR | Status: DC | PRN
  Filled 2015-08-30: qty 2

## 2015-08-30 MED ORDER — LIDOCAINE HCL (PF) 1 % IJ SOLN: 5.0000 mL | INTRAMUSCULAR | Status: DC | PRN

## 2015-08-30 MED ORDER — HEPARIN SODIUM (PORCINE) 1000 UNIT/ML DIALYSIS: 2000.0000 [IU] | INTRAMUSCULAR | Status: DC | PRN

## 2015-08-30 NOTE — Progress Notes (Signed)
  [facility] Progress Note   Subjective: no c/o, off pressors, moving out to floor today  Filed Vitals:   08/30/15 1037 08/30/15 1100 08/30/15 1145 08/30/15 1200  BP: 137/54 119/56  125/55  Pulse: 78 71  68  Temp: 98 F (36.7 C)  97.9 F (36.6 C)   TempSrc: Oral  Oral   Resp: 12 16  17   Weight: 74.3 kg (163 lb 12.8 oz)     SpO2: 98% 98%  98%    Inpatient medications: . ciprofloxacin  500 mg Oral Daily  . doxercalciferol  0.5 mcg Intravenous Q M,W,F-HD  . heparin  5,000 Units Subcutaneous Q8H  . insulin aspart  0-9 Units Subcutaneous TID WC     sodium chloride, sodium chloride, sodium chloride, sodium chloride, sodium chloride, sodium chloride, sodium chloride, alteplase, fentaNYL (SUBLIMAZE) injection, heparin, [START ON 08/31/2015] heparin, lidocaine (PF), lidocaine-prilocaine, ondansetron (ZOFRAN) IV, pentafluoroprop-tetrafluoroeth  Exam: Gen alert, no distress No rash, cyanosis or gangrene Sclera anicteric, throat clear  No jvd or bruits Chest clear bilat RRR no MRG Abd soft, diffuse mild tenderness,+bs, no mass or ascites GU normal male MS bilat LE amputee, no edema Ext no wounds or ulcers Neuro is alert, Ox 3 , nf, gen weakness   UA + pyuria, bacteriuria  Dialysis: [facility] MWF 4h 73kg Hep 1000 + 1K/ hr LFA AVF 35 deg (helps w hypotension) EPO 2400 tiw, Hect 0.5 tiw  Assessment: 1. N/V - Klebsiella UTI, recurrent issue.  No hydro or stones on abd CT.   2. Recent Klebs pyelo/UTI 3. ESRD on HD 4. Hypotension - resolved, off pressors 5. HTN norvasc/ lisinopril on hold 6. PVD / bilat LE amp 7. IDDM 8. 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 10. Vol euvolemic , at dry wt  Plan - HD today   Kelly Splinter MD [facility] pager [phone]    cell [phone] 08/30/2015, 12:58 PM    Recent Labs Lab 08/28/15 1158 08/29/15 0235 08/30/15 0324  NA 135 131* 135  K 3.9 4.0 3.8  CL 105 100* 103  CO2 16* 17* 24  GLUCOSE 114*  120* 81  BUN 52* 60* 33*  CREATININE 5.31* 5.44* 4.15*  CALCIUM 7.1* 7.0* 7.2*  PHOS 4.4 4.7*  --     Recent Labs Lab 08/27/15 2054 08/28/15 1158  AST 26  --   ALT 16*  --   ALKPHOS 208*  --   BILITOT 1.2  --   PROT 6.5  --   ALBUMIN 2.7* 2.0*    Recent Labs Lab 08/27/15 2054 08/28/15 1158 08/29/15 0235 08/30/15 0324  WBC 15.8* 10.9* 10.5 6.5  NEUTROABS 13.0*  --   --   --   HGB 13.4 11.4* 10.9* 9.4*  HCT 41.1 35.5* 33.5* 29.1*  MCV 94.3 93.9 92.3 91.2  PLT 234 217 196 161   Iron/TIBC/Ferritin/ %Sat    Component Value Date/Time   IRON 121 08/14/2015 0619   TIBC 203* 08/14/2015 0619   FERRITIN 380* 05/20/2012 0527   IRONPCTSAT 60* 08/14/2015 [id number]

## 2015-08-30 NOTE — Care Management Important Message (Signed)
Important Message  Patient Details  Name: Alan [name] MRN: [id number] Date of Birth: 04/05/1940   Medicare Important Message Given:  Yes    Nathen May 08/30/2015, 10:23 AM

## 2015-08-30 NOTE — Progress Notes (Addendum)
PULMONARY / CRITICAL CARE MEDICINE   Name: Alan [name] MRN: [id number] DOB: 09-04-1940    ADMISSION DATE:  08/27/2015 CONSULTATION DATE:  08/28/15  REFERRING MD:  Regenia Skeeter  CHIEF COMPLAINT:  Abdominal pain  HISTORY OF PRESENT ILLNESS:   This is a pleasant 75 y/o male with peripheral arterial disease and ESRD who presented to [facility] on 7/9 with sharp right sided abdominal pain. He says that the pain came on suddenly on 7/9 and feels similar to multiple prior bladder infections he has experienced in the past. He had some non-bloody vomitous with this, no diarrhea or hematochezia. In the ER he was noted to have hypotension so was given IVF and antibiotics and was transferred to [facility] for further management. He notes a cough only after vomiting this evening, but no dyspnea. He denies sick contacts. He still has some cramping abdominal pain. In route here he had a large bowel movement.  SUBJECTIVE:  Pt states he is doing fine this morning. His abdominal pain is about the same as yesterday. He states he has been having some "aching" when he urinates recently. He had one BM yesterday.  VITAL SIGNS: BP 98/47 mmHg  Pulse 74  Temp(Src) 97.8 F (36.6 C) (Oral)  Resp 20  Wt 74.2 kg (163 lb 9.3 oz)  SpO2 97%  HEMODYNAMICS:    VENTILATOR SETTINGS:    INTAKE / OUTPUT: I/O last 3 completed shifts: In: 1769.4 [P.O.:120; I.V.:1449.4; IV Piggyback:200] Out: 175 [Urine:175]  PHYSICAL EXAMINATION: General: Awake, alert, no distress Neuro: Alert and oriented, answering questions appropriately, mild left sided weakness HEENT: NCAT, EOMI, moist mucous membranes Cardiovascular: RRR, no mgr Lungs: CTAB, normal work of breathing Abdomen: +BS, soft, non-distended, tenderness to palpation of the LLQ, no guarding or rebound tenderness, no masses Musculoskeletal: Normal bulk and tone, well-healed stumps bilaterally Skin: No rash or skin breakdown, no mottling  LABS:  BMET  Recent  Labs Lab 08/28/15 1158 08/29/15 0235 08/30/15 0324  NA 135 131* 135  K 3.9 4.0 3.8  CL 105 100* 103  CO2 16* 17* 24  BUN 52* 60* 33*  CREATININE 5.31* 5.44* 4.15*  GLUCOSE 114* 120* 81    Electrolytes  Recent Labs Lab 08/28/15 1158 08/29/15 0235 08/30/15 0324  CALCIUM 7.1* 7.0* 7.2*  MG 2.4 2.3  --   PHOS 4.4 4.7*  --     CBC  Recent Labs Lab 08/28/15 1158 08/29/15 0235 08/30/15 0324  WBC 10.9* 10.5 6.5  HGB 11.4* 10.9* 9.4*  HCT 35.5* 33.5* 29.1*  PLT 217 196 161    Coag's  Recent Labs Lab 08/27/15 2054  INR 1.07    Sepsis Markers  Recent Labs Lab 08/28/15 0329 08/28/15 1158 08/28/15 1532  LATICACIDVEN 6.74* 5.8* 3.3*    ABG No results for input(s): PHART, PCO2ART, PO2ART in the last 168 hours.  Liver Enzymes  Recent Labs Lab 08/27/15 2054 08/28/15 1158  AST 26  --   ALT 16*  --   ALKPHOS 208*  --   BILITOT 1.2  --   ALBUMIN 2.7* 2.0*    Cardiac Enzymes  Recent Labs Lab 08/28/15 1158 08/28/15 1808  TROPONINI <0.03 0.03*    Glucose  Recent Labs Lab 08/29/15 1158 08/29/15 1537 08/29/15 2027 08/29/15 2108 08/30/15 0024 08/30/15 0412  GLUCAP 82 101* 64* 107* 74 93    Imaging No results found.   STUDIES:  7/10 CT abdomen> diffusely distended colon filled with stool and fluid, lung nodules, likely colitis  CULTURES:  7/9 blood >> no growth 7/9 urine >> >100,000 colonies GNR 7/10 c diff > negative  ANTIBIOTICS: 7/10 oral vanc > 7/10 7/10 zosyn >   SIGNIFICANT EVENTS: 7/10 admit  LINES/TUBES: PIV 7/9 >>   DISCUSSION: 75 y/o male with a past medical history of ESRD and atherosclerosis presented to [facility] on 7/10 on [facility] with lactic acidosis in the setting of septic shock from colitis. DDx of his colitis is infectious vs less likely inflammatory or possibly ischemic given his known atherosclerosis. Currently shock is improving.  ASSESSMENT / PLAN:  GASTROINTESTINAL A:  Acute  Colitis- improving. Likely infectious. P:  Has been on clear liquid diet. Advance to full liquids today.  CARDIOVASCULAR A:  Septic shock- resolved.  Peripheral arterial disease Known CAD History of stroke P:  Telemetry Off Neo since 0147 7/12 Hold off on further IVF for now  RENAL A:  ESRD- s/p HD 7/11. Hyponatremia- resolved Microscopic Hematuria P:  Nephrology following, appreciate recommendations. Plan for HD today. Trend electrolytes daily and replace as needed  INFECTIOUS A:  Acute colitis- likely infectious. Klebsiella pneumoniae UTI P:  Zosyn (Day 3). Tykeem narrow to Cipro to cover both colitis and UTI. Recommend 10 days of IV antibiotics because Pt was just treated for pyelo with this same pathogen.  PULMONARY A: No acute issues Pulmonary nodule P:  Nodule Rayon need f/u after discharge  HEMATOLOGIC A:  No acute issues P:  Monitor for bleeding Sub q heparin for dvt prophylaxis  ENDOCRINE A:  DM2- last A1c 5.2% (10/2014). P:  Sensitive SSI Accuchecks four times daily  NEUROLOGIC A:  No acute issues P:  Fentanyl prn pain   Hyman Bible, MD Family Medicine, PGY-2  08/30/2015, 7:10 AM  Attending note: Feels better.  Leg pain improved.  Alert. Normal strength. HR regular. No wheeze. Abd soft, mild tenderness. B/l leg stumps clean.  Hb 9.4, Creatinine 4.15  Assessment/plan: Septic shock 2nd to colitis >> resolved. - monitor hemodynamics  Colitis. UTI with recent episode of pyelonephritis. - continue Abx for 10 days total >> change to cipro  ESRD. - HD per nephrology  Lung nodule. - f/u as outpt  To telemetry bed 7/12 >> to [facility] 7/13 and PCCM off.  Chesley Mires, MD [facility] Pulmonary/Critical Care 08/30/2015, 11:37 AM Pager:  [phone] After 3pm call: [phone]

## 2015-08-30 NOTE — Care Management Note (Signed)
Case Management Note  Patient Details  Name: Eliijah Lafayette MRN: [id number] Date of Birth: [birth year]  Subjective/Objective:       Pt admitted with septic shock             Action/Plan:  Pt A & O.  PTA independent from home with wife.  Pt is wheelchair bound  - bilateral amputee for approximately 1 year - denies CM needs at this time.  CM Chasyn continue to monitor for discharge needs   Expected Discharge Date:  09/02/15               Expected Discharge Plan:  [place]  In-House Referral:     Discharge planning Services  CM Consult  Post Acute Care Choice:    Choice offered to:     DME Arranged:    DME Agency:     HH Arranged:    HH Agency:     Status of Service:  In process, [place] continue to follow  If discussed at Long Length of Stay Meetings, dates discussed:    Additional Comments:  Maryclare Labrador, RN 08/30/2015, 1:02 PM

## 2015-08-30 NOTE — Progress Notes (Deleted)
  [facility] Progress Note   Subjective: no c/o, off pressors, moving out to floor today  Filed Vitals:   08/30/15 1015 08/30/15 1037 08/30/15 1100 08/30/15 1145  BP: 149/66 137/54 119/56   Pulse: 78 78 71   Temp:  98 F (36.7 C)  97.9 F (36.6 C)  TempSrc:  Oral  Oral  Resp: 16 12 16    Weight:  74.3 kg (163 lb 12.8 oz)    SpO2: 99% 98% 98%     Inpatient medications: . ciprofloxacin  500 mg Oral Daily  . doxercalciferol  0.5 mcg Intravenous Q M,W,F-HD  . heparin  5,000 Units Subcutaneous Q8H  . insulin aspart  0-9 Units Subcutaneous TID WC     sodium chloride, sodium chloride, sodium chloride, sodium chloride, sodium chloride, sodium chloride, sodium chloride, alteplase, fentaNYL (SUBLIMAZE) injection, heparin, [START ON 08/31/2015] heparin, lidocaine (PF), lidocaine-prilocaine, ondansetron (ZOFRAN) IV, pentafluoroprop-tetrafluoroeth  Exam: Gen alert, no distress No rash, cyanosis or gangrene Sclera anicteric, throat clear  No jvd or bruits Chest clear bilat RRR no MRG Abd soft, diffuse mild tenderness,+bs, no mass or ascites GU normal male MS bilat LE amputee, no edema Ext no wounds or ulcers Neuro is alert, Ox 3 , nf, gen weakness   UA + pyuria, bacteriuria  Dialysis: [facility] MWF 4h 73kg Hep 1000 + 1K/ hr LFA AVF 35 deg (helps w hypotension) EPO 2400 tiw, Hect 0.5 tiw  Assessment: 1. N/V - Klebsiella UTI, recurrent issue.  No hydro or stones on abd CT.   2. Recent Klebs pyelo/UTI 3. ESRD on HD 4. Hypotension - resolved, off pressors 5. HTN norvasc/ lisinopril on hold 6. PVD / bilat LE amp 7. IDDM 8. 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 10. Vol euvolemic , at dry wt  Plan - HD today   Kelly Splinter MD [facility] pager [phone]    cell [phone] 08/30/2015, 12:04 PM    Recent Labs Lab 08/28/15 1158 08/29/15 0235 08/30/15 0324  NA 135 131* 135  K 3.9 4.0 3.8  CL 105 100* 103  CO2 16* 17* 24  GLUCOSE 114*  120* 81  BUN 52* 60* 33*  CREATININE 5.31* 5.44* 4.15*  CALCIUM 7.1* 7.0* 7.2*  PHOS 4.4 4.7*  --     Recent Labs Lab 08/27/15 2054 08/28/15 1158  AST 26  --   ALT 16*  --   ALKPHOS 208*  --   BILITOT 1.2  --   PROT 6.5  --   ALBUMIN 2.7* 2.0*    Recent Labs Lab 08/27/15 2054 08/28/15 1158 08/29/15 0235 08/30/15 0324  WBC 15.8* 10.9* 10.5 6.5  NEUTROABS 13.0*  --   --   --   HGB 13.4 11.4* 10.9* 9.4*  HCT 41.1 35.5* 33.5* 29.1*  MCV 94.3 93.9 92.3 91.2  PLT 234 217 196 161   Iron/TIBC/Ferritin/ %Sat    Component Value Date/Time   IRON 121 08/14/2015 0619   TIBC 203* 08/14/2015 0619   FERRITIN 380* 05/20/2012 0527   IRONPCTSAT 60* 08/14/2015 [id number]

## 2015-08-31 LAB — GLUCOSE, CAPILLARY
GLUCOSE-CAPILLARY: 101 mg/dL — AB (ref 65–99)
GLUCOSE-CAPILLARY: 123 mg/dL — AB (ref 65–99)
Glucose-Capillary: 124 mg/dL — ABNORMAL HIGH (ref 65–99)
Glucose-Capillary: 160 mg/dL — ABNORMAL HIGH (ref 65–99)

## 2015-08-31 MED ORDER — HEPARIN SODIUM (PORCINE) 1000 UNIT/ML DIALYSIS: 1000.0000 [IU] | INTRAMUSCULAR | Status: DC | PRN

## 2015-08-31 MED ORDER — ALTEPLASE 2 MG IJ SOLR: 2.0000 mg | Freq: Once | INTRAMUSCULAR | Status: DC | PRN

## 2015-08-31 MED ORDER — PENTAFLUOROPROP-TETRAFLUOROETH EX AERO: 1.0000 "application " | INHALATION_SPRAY | CUTANEOUS | Status: DC | PRN

## 2015-08-31 MED ORDER — LIDOCAINE HCL (PF) 1 % IJ SOLN: 5.0000 mL | INTRAMUSCULAR | Status: DC | PRN

## 2015-08-31 MED ORDER — SODIUM CHLORIDE 0.9 % IV SOLN: 100.0000 mL | INTRAVENOUS | Status: DC | PRN

## 2015-08-31 MED ORDER — LIDOCAINE-PRILOCAINE 2.5-2.5 % EX CREA: 1.0000 "application " | TOPICAL_CREAM | CUTANEOUS | Status: DC | PRN

## 2015-08-31 MED ORDER — HEPARIN SODIUM (PORCINE) 1000 UNIT/ML DIALYSIS
2000.0000 [IU] | INTRAMUSCULAR | Status: DC | PRN
Start: 2015-09-01 — End: 2015-09-01

## 2015-08-31 MED ORDER — TAMSULOSIN HCL 0.4 MG PO CAPS
0.4000 mg | ORAL_CAPSULE | Freq: Every day | ORAL | Status: DC
  Administered 2015-08-31 – 2015-09-02 (×3): 0.4 mg via ORAL
  Filled 2015-08-31 (×3): qty 1

## 2015-08-31 NOTE — Progress Notes (Signed)
   [facility] Progress Note   Subjective: BP's much better  Filed Vitals:   08/30/15 1646 08/30/15 2141 08/31/15 0549 08/31/15 0754  BP: 136/47 130/54 146/59 152/57  Pulse: 73 76 71 81  Temp: 98.2 F (36.8 C) 98 F (36.7 C) 98.1 F (36.7 C) 98.5 F (36.9 C)  TempSrc: Oral Oral Oral Oral  Resp: 17 16 18 17   Weight:  71.895 kg (158 lb 8 oz)    SpO2: 96% 97% 98% 96%    Inpatient medications: . ciprofloxacin  500 mg Oral Daily  . doxercalciferol  0.5 mcg Intravenous Q M,W,F-HD  . heparin  5,000 Units Subcutaneous Q8H  . insulin aspart  0-9 Units Subcutaneous TID WC     sodium chloride, sodium chloride, sodium chloride, sodium chloride, sodium chloride, sodium chloride, sodium chloride, alteplase, fentaNYL (SUBLIMAZE) injection, heparin, heparin, lidocaine (PF), lidocaine-prilocaine, ondansetron (ZOFRAN) IV, pentafluoroprop-tetrafluoroeth  Exam: Gen alert, no distress No rash, cyanosis or gangrene Sclera anicteric, throat clear  No jvd or bruits Chest clear bilat RRR no MRG Abd soft, diffuse mild tenderness,+bs, no mass or ascites GU normal male MS bilat LE amputee, no edema Ext no wounds or ulcers Neuro is alert, Ox 3 , nf, gen weakness   UA + pyuria, bacteriuria  Dialysis: [facility] MWF 4h 73kg Hep 1000 + 1K/ hr LFA AVF 35 deg (helps w hypotension) EPO 2400 tiw, Hect 0.5 tiw  Assessment: 1. Klebsiella UTI, recurrent - was here in last June for Klebsiella pyelo. CT here shows no hydro, kidneys normal in appearance. Family says patient was seen by urology in [place] prior to these last two admissions for painful urination and/or bacteria in the urine.  Rec urology consult 2.  Vol euvolemic , at dry wt 3. ESRD on HD 4. Hypotension - resolved, sp pressors 5. HTN - resume norvasc 5/d (holding acei still) 6. PVD / bilat LE amp 7. IDDM 8. 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   Plan - HD tomorrow, urology eval   Kelly Splinter MD [facility] pager [phone]    cell [phone] 08/31/2015, 12:38 PM    Recent Labs Lab 08/28/15 1158 08/29/15 0235 08/30/15 0324  NA 135 131* 135  K 3.9 4.0 3.8  CL 105 100* 103  CO2 16* 17* 24  GLUCOSE 114* 120* 81  BUN 52* 60* 33*  CREATININE 5.31* 5.44* 4.15*  CALCIUM 7.1* 7.0* 7.2*  PHOS 4.4 4.7*  --     Recent Labs Lab 08/27/15 2054 08/28/15 1158  AST 26  --   ALT 16*  --   ALKPHOS 208*  --   BILITOT 1.2  --   PROT 6.5  --   ALBUMIN 2.7* 2.0*    Recent Labs Lab 08/27/15 2054 08/28/15 1158 08/29/15 0235 08/30/15 0324  WBC 15.8* 10.9* 10.5 6.5  NEUTROABS 13.0*  --   --   --   HGB 13.4 11.4* 10.9* 9.4*  HCT 41.1 35.5* 33.5* 29.1*  MCV 94.3 93.9 92.3 91.2  PLT 234 217 196 161   Iron/TIBC/Ferritin/ %Sat    Component Value Date/Time   IRON 121 08/14/2015 0619   TIBC 203* 08/14/2015 0619   FERRITIN 380* 05/20/2012 0527   IRONPCTSAT 60* 08/14/2015 [id number]

## 2015-08-31 NOTE — Progress Notes (Signed)
Patient ID: Alan [name], male   DOB: 1940/10/10, 74 y.o.   MRN: [id number]    PROGRESS NOTE    Alan [name]  [id number] DOB: October 02, 1940 DOA: 08/27/2015  PCP: [facility]   Brief Narrative:  This is a pleasant 75 y/o male with peripheral arterial disease and ESRD who presented to [facility] on 7/9 with sharp right sided abdominal pain. He says that the pain came on suddenly on 7/9 and feels similar to multiple prior bladder infections he has experienced in the past. He had some non-bloody vomitous with this, no diarrhea or hematochezia. In the ER he was noted to have hypotension so was given IVF and antibiotics and was transferred to [facility] for further management. He notes a cough only after vomiting this evening, but no dyspnea.   Assessment & Plan:  Acute Colitis - Likely infectious - Has been on clear liquid diet. Advanced to full liquids  - so far tolerating well   Septic shock secondary to the above in pt with known hx of PAD and CAD, stroke - resolved  - Off Neo since 0147 7/12 - Hold off on further IVF for now  ESRD - s/p HD 7/11 - nephrology team following, appreciate assistance   Hyponatremia - resolved   Microscopic Hematuria - consulted with urologist, Dr. [name] recommended starting Flomax and outpatient follow up - I have also mentioned UTI issues and dysuria, urologist reviewed imaging studies, no indication for inpatient intervention per Dr. [name]   Klebsiella pneumoniae UTI - Zosyn completed for three days and transitioned to Cipro to cover both colitis and UTI.  - Recommend 10 days of antibiotics because pt was just treated for pyelo with this same pathogen  Pulmonary nodule - Nodule Alan [name] need f/u after discharge - Ct Renal Stone Study  08/28/2015  10 mm right lung base nodule  DM2- last A1c 5.2% (10/2014) - Sensitive SSI - Accuchecks four times daily  DVT prophylaxis: Heparin SQ Code Status: Full  Family  Communication: Patient at bedside, called daughter over the phone and left message to call me back  Disposition Plan: Home in 1-2 days  Consultants:   Nephrologist   Urologist Dr. [name] who recommend outpatient follow up   Procedures:   None  Antimicrobials:   Zosyn 7/10 transitioned to Cipro 7/12 -->  Subjective: No events overnight.   Objective: Filed Vitals:   08/30/15 1646 08/30/15 2141 08/31/15 0549 08/31/15 0754  BP: 136/47 130/54 146/59 152/57  Pulse: 73 76 71 81  Temp: 98.2 F (36.8 C) 98 F (36.7 C) 98.1 F (36.7 C) 98.5 F (36.9 C)  TempSrc: Oral Oral Oral Oral  Resp: 17 16 18 17   Weight:  71.895 kg (158 lb 8 oz)    SpO2: 96% 97% 98% 96%    Intake/Output Summary (Last 24 hours) at 08/31/15 1326 Last data filed at 08/31/15 0900  Gross per 24 hour  Intake    240 ml  Output    100 ml  Net    140 ml   Filed Weights   08/30/15 0707 08/30/15 1037 08/30/15 2141  Weight: 74.3 kg (163 lb 12.8 oz) 74.3 kg (163 lb 12.8 oz) 71.895 kg (158 lb 8 oz)    Examination:  General exam: Appears calm and comfortable  Respiratory system: Clear to auscultation. Respiratory effort normal. Cardiovascular system: S1 & S2 heard, RRR. No JVD, murmurs, rubs, gallops or clicks.  Gastrointestinal system: Abdomen is nondistended, soft but slightly tender  in epigastric area. No organomegaly Musculoskeletal: Normal bulk and tone, well-healed stumps bilaterally  Data Reviewed: I have personally reviewed following labs and imaging studies  CBC:  Recent Labs Lab 08/27/15 2054 08/28/15 1158 08/29/15 0235 08/30/15 0324  WBC 15.8* 10.9* 10.5 6.5  NEUTROABS 13.0*  --   --   --   HGB 13.4 11.4* 10.9* 9.4*  HCT 41.1 35.5* 33.5* 29.1*  MCV 94.3 93.9 92.3 91.2  PLT 234 217 196 [id number]   Basic Metabolic Panel:  Recent Labs Lab 08/27/15 2054 08/28/15 1158 08/29/15 0235 08/30/15 0324  NA 135 135 131* 135  K 3.7 3.9 4.0 3.8  CL 98* 105 100* 103  CO2 26 16* 17* 24  GLUCOSE  143* 114* 120* 81  BUN 44* 52* 60* 33*  CREATININE 4.98* 5.31* 5.44* 4.15*  CALCIUM 8.0* 7.1* 7.0* 7.2*  MG  --  2.4 2.3  --   PHOS  --  4.4 4.7*  --    Liver Function Tests:  Recent Labs Lab 08/27/15 2054 08/28/15 1158  AST 26  --   ALT 16*  --   ALKPHOS 208*  --   BILITOT 1.2  --   PROT 6.5  --   ALBUMIN 2.7* 2.0*    Recent Labs Lab 08/27/15 2054  LIPASE 22   Coagulation Profile:  Recent Labs Lab 08/27/15 2054  INR 1.07   Cardiac Enzymes:  Recent Labs Lab 08/28/15 1158 08/28/15 1808  TROPONINI <0.03 0.03*   CBG:  Recent Labs Lab 08/30/15 1431 08/30/15 1645 08/30/15 2218 08/31/15 0751 08/31/15 1136  GLUCAP 89 103* 82 101* 123*   Urine analysis:    Component Value Date/Time   COLORURINE YELLOW 08/27/2015 2145   APPEARANCEUR TURBID* 08/27/2015 2145   LABSPEC 1.020 08/27/2015 2145   PHURINE 6.0 08/27/2015 2145   GLUCOSEU NEGATIVE 08/27/2015 2145   HGBUR LARGE* 08/27/2015 2145   BILIRUBINUR SMALL* 08/27/2015 2145   KETONESUR NEGATIVE 08/27/2015 2145   PROTEINUR >300* 08/27/2015 2145   UROBILINOGEN 4.0* 11/05/2014 1930   NITRITE NEGATIVE 08/27/2015 2145   LEUKOCYTESUR LARGE* 08/27/2015 2145    Recent Results (from the past 240 hour(s))  Culture, blood (routine x 2)     Status: None (Preliminary result)   Collection Time: 08/27/15  8:54 PM  Result Value Ref Range Status   Specimen Description RIGHT ANTECUBITAL  Final   Special Requests BOTTLES DRAWN AEROBIC AND ANAEROBIC 8CC EACH  Final   Culture NO GROWTH 4 DAYS  Final   Report Status PENDING  Incomplete  Culture, blood (routine x 2)     Status: None (Preliminary result)   Collection Time: 08/27/15  9:17 PM  Result Value Ref Range Status   Specimen Description RIGHT ANTECUBITAL  Final   Special Requests BOTTLES DRAWN AEROBIC ONLY 4CC  Final   Culture NO GROWTH 4 DAYS  Final   Report Status PENDING  Incomplete  Urine culture     Status: Abnormal   Collection Time: 08/27/15  9:45 PM    Result Value Ref Range Status   Specimen Description URINE, CATHETERIZED  Final   Special Requests NONE  Final   Culture >=100,000 COLONIES/mL KLEBSIELLA PNEUMONIAE (A)  Final   Report Status 08/30/2015 FINAL  Final   Organism ID, Bacteria KLEBSIELLA PNEUMONIAE (A)  Final      Susceptibility   Klebsiella pneumoniae - MIC*    AMPICILLIN 16 RESISTANT Resistant     CEFAZOLIN <=4 SENSITIVE Sensitive     CEFTRIAXONE <=1 SENSITIVE  Sensitive     CIPROFLOXACIN <=0.25 SENSITIVE Sensitive     GENTAMICIN <=1 SENSITIVE Sensitive     IMIPENEM <=0.25 SENSITIVE Sensitive     NITROFURANTOIN 64 INTERMEDIATE Intermediate     TRIMETH/SULFA <=20 SENSITIVE Sensitive     AMPICILLIN/SULBACTAM 8 SENSITIVE Sensitive     PIP/TAZO 16 SENSITIVE Sensitive     * >=100,000 COLONIES/mL KLEBSIELLA PNEUMONIAE  C difficile quick scan w PCR reflex     Status: None   Collection Time: 08/28/15  4:41 AM  Result Value Ref Range Status   C Diff antigen NEGATIVE NEGATIVE Final   C Diff toxin NEGATIVE NEGATIVE Final   C Diff interpretation No C. difficile detected.  Final  MRSA PCR Screening     Status: None   Collection Time: 08/28/15  5:43 AM  Result Value Ref Range Status   MRSA by PCR NEGATIVE NEGATIVE Final      Radiology Studies: No results found.    Scheduled Meds: . ciprofloxacin  500 mg Oral Daily  . doxercalciferol  0.5 mcg Intravenous Q M,W,F-HD  . heparin  5,000 Units Subcutaneous Q8H  . insulin aspart  0-9 Units Subcutaneous TID WC   Continuous Infusions:    LOS: 3 days    Time spent: 20 minutes    Faye Ramsay, MD [facility] Pager [phone]  If 7PM-7AM, please contact night-coverage [email or web address] Password [email or web address] 08/31/2015, 1:26 PM

## 2015-09-01 LAB — BASIC METABOLIC PANEL
Anion gap: 9 (ref 5–15)
BUN: 17 mg/dL (ref 6–20)
CHLORIDE: 99 mmol/L — AB (ref 101–111)
CO2: 26 mmol/L (ref 22–32)
CREATININE: 3.85 mg/dL — AB (ref 0.61–1.24)
Calcium: 7.7 mg/dL — ABNORMAL LOW (ref 8.9–10.3)
GFR calc Af Amer: 16 mL/min — ABNORMAL LOW (ref >60)
GFR calc non Af Amer: 14 mL/min — ABNORMAL LOW (ref >60)
GLUCOSE: 122 mg/dL — AB (ref 65–99)
Potassium: 3.9 mmol/L (ref 3.5–5.1)
Sodium: 134 mmol/L — ABNORMAL LOW (ref 135–145)

## 2015-09-01 LAB — GLUCOSE, CAPILLARY
GLUCOSE-CAPILLARY: 157 mg/dL — AB (ref 65–99)
GLUCOSE-CAPILLARY: 105 mg/dL — AB (ref 65–99)
Glucose-Capillary: 120 mg/dL — ABNORMAL HIGH (ref 65–99)
Glucose-Capillary: 209 mg/dL — ABNORMAL HIGH (ref 65–99)

## 2015-09-01 LAB — CBC
HEMATOCRIT: 29.9 % — AB (ref 39.0–52.0)
HEMOGLOBIN: 9.8 g/dL — AB (ref 13.0–17.0)
MCH: 30.8 pg (ref 26.0–34.0)
MCHC: 32.8 g/dL (ref 30.0–36.0)
MCV: 94 fL (ref 78.0–100.0)
Platelets: 160 10*3/uL (ref 150–400)
RBC: 3.18 MIL/uL — ABNORMAL LOW (ref 4.22–5.81)
RDW: 14.6 % (ref 11.5–15.5)
WBC: 6 10*3/uL (ref 4.0–10.5)

## 2015-09-01 MED ORDER — DARBEPOETIN ALFA 25 MCG/0.42ML IJ SOSY
25.0000 ug | PREFILLED_SYRINGE | INTRAMUSCULAR | Status: DC
  Administered 2015-09-01: 25 ug via INTRAVENOUS
  Filled 2015-09-01: qty 0.42

## 2015-09-01 MED ORDER — AMLODIPINE BESYLATE 5 MG PO TABS
5.0000 mg | ORAL_TABLET | Freq: Every day | ORAL | Status: DC
  Administered 2015-09-01 – 2015-09-02 (×2): 5 mg via ORAL
  Filled 2015-09-01 (×2): qty 1

## 2015-09-01 MED ORDER — INSULIN GLARGINE 100 UNIT/ML ~~LOC~~ SOLN
8.0000 [IU] | Freq: Every evening | SUBCUTANEOUS | Status: DC
  Administered 2015-09-01 – 2015-09-02 (×2): 8 [IU] via SUBCUTANEOUS
  Filled 2015-09-01 (×2): qty 0.08

## 2015-09-01 MED ORDER — NEPRO/CARBSTEADY PO LIQD
237.0000 mL | Freq: Two times a day (BID) | ORAL | Status: DC
  Administered 2015-09-02 (×2): 237 mL via ORAL

## 2015-09-01 MED ORDER — RENA-VITE PO TABS
1.0000 | ORAL_TABLET | Freq: Every day | ORAL | Status: DC
  Administered 2015-09-01: 1 via ORAL
  Filled 2015-09-01: qty 1

## 2015-09-01 MED ORDER — DOXERCALCIFEROL 4 MCG/2ML IV SOLN
INTRAVENOUS | Status: AC
  Filled 2015-09-01: qty 2

## 2015-09-01 MED ORDER — LISINOPRIL 5 MG PO TABS
5.0000 mg | ORAL_TABLET | Freq: Every day | ORAL | Status: DC
  Administered 2015-09-01 – 2015-09-02 (×2): 5 mg via ORAL
  Filled 2015-09-01 (×2): qty 1

## 2015-09-01 MED ORDER — DARBEPOETIN ALFA 25 MCG/0.42ML IJ SOSY
PREFILLED_SYRINGE | INTRAMUSCULAR | Status: AC
  Filled 2015-09-01: qty 0.42

## 2015-09-01 MED ORDER — DOXERCALCIFEROL 0.5 MCG PO CAPS
ORAL_CAPSULE | ORAL | Status: AC
  Filled 2015-09-01: qty 1

## 2015-09-01 NOTE — Progress Notes (Addendum)
Patient ID: Alan [name], male   DOB: 1941-01-03, 75 y.o.   MRN: [id number]    PROGRESS NOTE    Alan [name]  [id number] DOB: 04-11-1940 DOA: 08/27/2015  PCP: [name]   Brief Narrative:  This is a pleasant 75 y/o male with peripheral arterial disease and ESRD who presented to [facility] on 7/9 with sharp right sided abdominal pain. He says that the pain came on suddenly on 7/9 and feels similar to multiple prior bladder infections he has experienced in the past. He had some non-bloody vomitous with this, no diarrhea or hematochezia. In the ER he was noted to have hypotension so was given IVF and antibiotics and was transferred to [name] for further management.    Assessment & Plan:  Acute Colitis - Likely infectious - Has been on full liquids and his diarrhea, nausea has improved and he reports no abdominal pain or vomiting. We Breck advance his diet to soft today and monitor.  He has remained afebrile and leukocytosis is resolved.  - Omarri get repeat lactic acid.    Septic shock secondary to the acute infectious colitis and klebsiella UTI  in pt with known hx of PAD and CAD, stroke - resolved  - Off Neo since 0147 7/12. - get repeat lactic acid.  - Hold off on further IVF for now.  ESRD on HD. - HD today. - s/p HD 7/11 - nephrology team following, appreciate assistance   Hyponatremia - resolved   Microscopic Hematuria - consulted with urologist, Dr. [name] recommended starting Flomax and outpatient follow up - mentioned UTI issues and dysuria, urologist reviewed imaging studies, no indication for inpatient intervention per Dr. [name] . - an appt to be made with urology outpatient.  Klebsiella pneumoniae UTI - Zosyn completed for three days and transitioned to Cipro to cover both colitis and UTI.  - Recommend 10 days of antibiotics because pt was just treated for pyelo with this same pathogen  Pulmonary nodule - Nodule Sinclair need f/u  after discharge - Ct Renal Stone Study  08/28/2015  10 mm right lung base nodule  DM2- last A1c 5.2% (10/2014) CBG (last 3)   Recent Labs  08/31/15 2030 09/01/15 0743 09/01/15 1126  GLUCAP 160* 120* 157*   Resume SSI while inpatient.  hgba1c ordered.  At home on 8 UNITS OF lantus, [name] be resumed today.    GERD: Holding prilosec for diarrhea.   Anemia of chronic disease: Baseline hemoglobin around 11, and it is 9.8 today, Aranesp to be given to at HD, and on low dose epo as outpatient.  Iron level is 121.   Hypertension: Sub-optimal. At home on norvasc and lisinopril. Plan to resume it today.     DVT prophylaxis: Heparin SQ Code Status: Full  Family Communication:  None at bedside.  Disposition Plan: Home when able to tolerate a regular diet.  Consultants:   Nephrologist   Urologist Dr. [name] who recommend outpatient follow up   Procedures:   None  Antimicrobials:   Zosyn 7/10 transitioned to Cipro 7/12 --> to complete a 10 day course.   Subjective: Reports diarrhea is improving. One loose BM today..   Objective: Filed Vitals:   08/31/15 1630 08/31/15 2032 09/01/15 0430 09/01/15 0751  BP: 158/59 149/60 134/50 152/57  Pulse: 78 74 71 73  Temp: 98 F (36.7 C) 98.4 F (36.9 C) 98.3 F (36.8 C) 98 F (36.7 C)  TempSrc: Oral Oral Oral Oral  Resp: 17  16 16 16   Weight:  73.12 kg (161 lb 3.2 oz)    SpO2: 97% 97% 98% 99%    Intake/Output Summary (Last 24 hours) at 09/01/15 1247 Last data filed at 09/01/15 0951  Gross per 24 hour  Intake   1420 ml  Output    290 ml  Net   1130 ml   Filed Weights   08/30/15 1037 08/30/15 2141 08/31/15 2032  Weight: 74.3 kg (163 lb 12.8 oz) 71.895 kg (158 lb 8 oz) 73.12 kg (161 lb 3.2 oz)    Examination:  General exam: Appears calm and comfortable  Respiratory system: Clear to auscultation. Respiratory effort normal. Cardiovascular system: S1 & S2 heard, RRR. No JVD, murmurs, rubs, gallops or clicks.    Gastrointestinal system: Abdomen is nondistended, soft , no tenderness today. No organomegaly Musculoskeletal: Normal bulk and tone, well-healed stumps bilaterally  Data Reviewed: I have personally reviewed following labs and imaging studies  CBC:  Recent Labs Lab 08/27/15 2054 08/28/15 1158 08/29/15 0235 08/30/15 0324 09/01/15 0500  WBC 15.8* 10.9* 10.5 6.5 6.0  NEUTROABS 13.0*  --   --   --   --   HGB 13.4 11.4* 10.9* 9.4* 9.8*  HCT 41.1 35.5* 33.5* 29.1* 29.9*  MCV 94.3 93.9 92.3 91.2 94.0  PLT 234 217 196 161 0000000   Basic Metabolic Panel:  Recent Labs Lab 08/27/15 2054 08/28/15 1158 08/29/15 0235 08/30/15 0324 09/01/15 0500  NA 135 135 131* 135 134*  K 3.7 3.9 4.0 3.8 3.9  CL 98* 105 100* 103 99*  CO2 26 16* 17* 24 26  GLUCOSE 143* 114* 120* 81 122*  BUN 44* 52* 60* 33* 17  CREATININE 4.98* 5.31* 5.44* 4.15* 3.85*  CALCIUM 8.0* 7.1* 7.0* 7.2* 7.7*  MG  --  2.4 2.3  --   --   PHOS  --  4.4 4.7*  --   --    Liver Function Tests:  Recent Labs Lab 08/27/15 2054 08/28/15 1158  AST 26  --   ALT 16*  --   ALKPHOS 208*  --   BILITOT 1.2  --   PROT 6.5  --   ALBUMIN 2.7* 2.0*    Recent Labs Lab 08/27/15 2054  LIPASE 22   Coagulation Profile:  Recent Labs Lab 08/27/15 2054  INR 1.07   Cardiac Enzymes:  Recent Labs Lab 08/28/15 1158 08/28/15 1808  TROPONINI <0.03 0.03*   CBG:  Recent Labs Lab 08/31/15 1136 08/31/15 1629 08/31/15 2030 09/01/15 0743 09/01/15 1126  GLUCAP 123* 124* 160* 120* 157*   Urine analysis:    Component Value Date/Time   COLORURINE YELLOW 08/27/2015 2145   APPEARANCEUR TURBID* 08/27/2015 2145   LABSPEC 1.020 08/27/2015 2145   PHURINE 6.0 08/27/2015 2145   GLUCOSEU NEGATIVE 08/27/2015 2145   HGBUR LARGE* 08/27/2015 2145   BILIRUBINUR SMALL* 08/27/2015 2145   KETONESUR NEGATIVE 08/27/2015 2145   PROTEINUR >300* 08/27/2015 2145   UROBILINOGEN 4.0* 11/05/2014 1930   NITRITE NEGATIVE 08/27/2015 2145    LEUKOCYTESUR LARGE* 08/27/2015 2145    Recent Results (from the past 240 hour(s))  Culture, blood (routine x 2)     Status: None (Preliminary result)   Collection Time: 08/27/15  8:54 PM  Result Value Ref Range Status   Specimen Description RIGHT ANTECUBITAL  Final   Special Requests BOTTLES DRAWN AEROBIC AND ANAEROBIC [place]  Final   Culture NO GROWTH 4 DAYS  Final   Report Status PENDING  Incomplete  Culture,  blood (routine x 2)     Status: None (Preliminary result)   Collection Time: 08/27/15  9:17 PM  Result Value Ref Range Status   Specimen Description RIGHT ANTECUBITAL  Final   Special Requests BOTTLES DRAWN AEROBIC ONLY 4CC  Final   Culture NO GROWTH 4 DAYS  Final   Report Status PENDING  Incomplete  Urine culture     Status: Abnormal   Collection Time: 08/27/15  9:45 PM  Result Value Ref Range Status   Specimen Description URINE, CATHETERIZED  Final   Special Requests NONE  Final   Culture >=100,000 COLONIES/mL KLEBSIELLA PNEUMONIAE (A)  Final   Report Status 08/30/2015 FINAL  Final   Organism ID, Bacteria KLEBSIELLA PNEUMONIAE (A)  Final      Susceptibility   Klebsiella pneumoniae - MIC*    AMPICILLIN 16 RESISTANT Resistant     CEFAZOLIN <=4 SENSITIVE Sensitive     CEFTRIAXONE <=1 SENSITIVE Sensitive     CIPROFLOXACIN <=0.25 SENSITIVE Sensitive     GENTAMICIN <=1 SENSITIVE Sensitive     IMIPENEM <=0.25 SENSITIVE Sensitive     NITROFURANTOIN 64 INTERMEDIATE Intermediate     TRIMETH/SULFA <=20 SENSITIVE Sensitive     AMPICILLIN/SULBACTAM 8 SENSITIVE Sensitive     PIP/TAZO 16 SENSITIVE Sensitive     * >=100,000 COLONIES/mL KLEBSIELLA PNEUMONIAE  C difficile quick scan w PCR reflex     Status: None   Collection Time: 08/28/15  4:41 AM  Result Value Ref Range Status   C Diff antigen NEGATIVE NEGATIVE Final   C Diff toxin NEGATIVE NEGATIVE Final   C Diff interpretation No C. difficile detected.  Final  MRSA PCR Screening     Status: None   Collection Time: 08/28/15   5:43 AM  Result Value Ref Range Status   MRSA by PCR NEGATIVE NEGATIVE Final      Radiology Studies: No results found.    Scheduled Meds: . ciprofloxacin  500 mg Oral Daily  . darbepoetin (ARANESP) injection - DIALYSIS  25 mcg Intravenous Q Fri-HD  . doxercalciferol  0.5 mcg Intravenous Q M,W,F-HD  . feeding supplement (NEPRO CARB STEADY)  237 mL Oral BID BM  . heparin  5,000 Units Subcutaneous Q8H  . insulin aspart  0-9 Units Subcutaneous TID WC  . multivitamin  1 tablet Oral QHS  . tamsulosin  0.4 mg Oral Daily   Continuous Infusions:    LOS: 4 days    Time spent: 30 minutes.    Hosie Poisson, MD [facility] Pager [phone]   If 7PM-7AM, please contact night-coverage [email or web address] Password [facility] 09/01/2015, 12:47 PM

## 2015-09-01 NOTE — Care Management Important Message (Signed)
Important Message  Patient Details  Name: Alan [name] MRN: [id number] Date of Birth: 08-27-1940   Medicare Important Message Given:  Yes    Henri Guedes Abena 09/01/2015, 11:45 AM

## 2015-09-01 NOTE — Progress Notes (Signed)
[facility] Progress Note  Assessment/Plan: 1. Klebsiella UTI - recurrent - Dr. Jonnie Finner rec rology UTI - family said he saw urology in [place] prior to last two admissions. - on cipro 2. ESRD - MWF - HD today - start on 4 K bath 3. Anemia - hgb 9.8 declined since admission - on low dose Epo as outpt - give Araneps 25 today 4. Secondary hyperparathyroidism - on hectorol/no binders P ok 5. HTN/volume - volume control with HD 6. Nutrition - added renavite/nepro to [facility] diet - rec adv diet  Myriam Jacobson, PA-C [facility] Beeper [phone] 09/01/2015,9:15 AM  LOS: 4 days   Subjective:   No c/o  Objective Filed Vitals:   08/31/15 1630 08/31/15 2032 09/01/15 0430 09/01/15 0751  BP: 158/59 149/60 134/50 152/57  Pulse: 78 74 71 73  Temp: 98 F (36.7 C) 98.4 F (36.9 C) 98.3 F (36.8 C) 98 F (36.7 C)  TempSrc: Oral Oral Oral Oral  Resp: 17 16 16 16   Weight:  73.12 kg (161 lb 3.2 oz)    SpO2: 97% 97% 98% 99%   Physical Exam General: NAD Heart: RRR Lungs: no rales Abdomen: soft NT 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 Dialysis Access:  Left AVF + bruit  Dialysis Orders: [facility] MWF 4h 73kg Hep 1000 + 1K/ hr LFA AVF 35 deg (helps w hypotension) EPO 2400 tiw, Hect 0.5 tiw  Additional Objective Labs: Basic Metabolic Panel:  Recent Labs Lab 08/28/15 1158 08/29/15 0235 08/30/15 0324 09/01/15 0500  NA 135 131* 135 134*  K 3.9 4.0 3.8 3.9  CL 105 100* 103 99*  CO2 16* 17* 24 26  GLUCOSE 114* 120* 81 122*  BUN 52* 60* 33* 17  CREATININE 5.31* 5.44* 4.15* 3.85*  CALCIUM 7.1* 7.0* 7.2* 7.7*  PHOS 4.4 4.7*  --   --    Liver Function Tests:  Recent Labs Lab 08/27/15 2054 08/28/15 1158  AST 26  --   ALT 16*  --   ALKPHOS 208*  --   BILITOT 1.2  --   PROT 6.5  --   ALBUMIN 2.7* 2.0*    Recent Labs Lab 08/27/15 2054  LIPASE 22   CBC:  Recent Labs Lab 08/27/15 2054 08/28/15 1158 08/29/15 0235 08/30/15 0324  09/01/15 0500  WBC 15.8* 10.9* 10.5 6.5 6.0  NEUTROABS 13.0*  --   --   --   --   HGB 13.4 11.4* 10.9* 9.4* 9.8*  HCT 41.1 35.5* 33.5* 29.1* 29.9*  MCV 94.3 93.9 92.3 91.2 94.0  PLT 234 217 196 161 160   Blood Culture    Component Value Date/Time   SDES URINE, CATHETERIZED 08/27/2015 2145   SPECREQUEST NONE 08/27/2015 2145   CULT >=100,000 COLONIES/mL KLEBSIELLA PNEUMONIAE* 08/27/2015 2145   REPTSTATUS 08/30/2015 FINAL 08/27/2015 2145    Cardiac Enzymes:  Recent Labs Lab 08/28/15 1158 08/28/15 1808  TROPONINI <0.03 0.03*   CBG:  Recent Labs Lab 08/31/15 0751 08/31/15 1136 08/31/15 1629 08/31/15 2030 09/01/15 0743  GLUCAP 101* 123* 124* 160* 120*   Lab Results  Component Value Date   INR 1.07 08/27/2015   INR 1.15 04/09/2013   INR 1.11 05/22/2012  Medications:   . ciprofloxacin  500 mg Oral Daily  . doxercalciferol  0.5 mcg Intravenous Q M,W,F-HD  . heparin  5,000 Units Subcutaneous Q8H  . insulin aspart  0-9 Units Subcutaneous TID WC  . tamsulosin  0.4 mg Oral Daily

## 2015-09-02 DIAGNOSIS — I10 Essential (primary) hypertension: Secondary | ICD-10-CM

## 2015-09-02 DIAGNOSIS — E43 Unspecified severe protein-calorie malnutrition: Secondary | ICD-10-CM

## 2015-09-02 DIAGNOSIS — Z89511 Acquired absence of right leg below knee: Secondary | ICD-10-CM

## 2015-09-02 DIAGNOSIS — E1159 Type 2 diabetes mellitus with other circulatory complications: Secondary | ICD-10-CM

## 2015-09-02 DIAGNOSIS — Z89512 Acquired absence of left leg below knee: Secondary | ICD-10-CM

## 2015-09-02 DIAGNOSIS — N390 Urinary tract infection, site not specified: Secondary | ICD-10-CM

## 2015-09-02 LAB — BASIC METABOLIC PANEL
ANION GAP: 6 (ref 5–15)
BUN: 7 mg/dL (ref 6–20)
CHLORIDE: 104 mmol/L (ref 101–111)
CO2: 29 mmol/L (ref 22–32)
Calcium: 7.8 mg/dL — ABNORMAL LOW (ref 8.9–10.3)
Creatinine, Ser: 3.08 mg/dL — ABNORMAL HIGH (ref 0.61–1.24)
GFR calc Af Amer: 21 mL/min — ABNORMAL LOW (ref >60)
GFR, EST NON AFRICAN AMERICAN: 18 mL/min — AB (ref >60)
GLUCOSE: 144 mg/dL — AB (ref 65–99)
POTASSIUM: 3.8 mmol/L (ref 3.5–5.1)
SODIUM: 139 mmol/L (ref 135–145)

## 2015-09-02 LAB — GLUCOSE, CAPILLARY
GLUCOSE-CAPILLARY: 146 mg/dL — AB (ref 65–99)
GLUCOSE-CAPILLARY: 175 mg/dL — AB (ref 65–99)
Glucose-Capillary: 135 mg/dL — ABNORMAL HIGH (ref 65–99)

## 2015-09-02 LAB — LACTIC ACID, PLASMA
Lactic Acid, Venous: 1.6 mmol/L (ref 0.5–1.9)
Lactic Acid, Venous: 1.7 mmol/L (ref 0.5–1.9)

## 2015-09-02 LAB — CULTURE, BLOOD (ROUTINE X 2)
CULTURE: NO GROWTH
CULTURE: NO GROWTH

## 2015-09-02 LAB — HEMOGLOBIN A1C
Hgb A1c MFr Bld: 5.3 % (ref 4.8–5.6)
MEAN PLASMA GLUCOSE: 105 mg/dL

## 2015-09-02 MED ORDER — TAMSULOSIN HCL 0.4 MG PO CAPS: 0.4000 mg | ORAL_CAPSULE | Freq: Every day | ORAL | Status: DC

## 2015-09-02 MED ORDER — CIPROFLOXACIN HCL 500 MG PO TABS: 500.0000 mg | ORAL_TABLET | Freq: Every day | ORAL | Status: DC

## 2015-09-02 NOTE — Progress Notes (Signed)
[facility] Progress Note  Assessment/Plan: 1. Klebsiella UTI - recurrent - Dr. Jonnie Finner rec rology UTI - family said he saw urology in [place] prior to last two admissions. - on cipro 2. ESRD - MWF - last HD Friday  4 K bath K was 3.9 3. Anemia - hgb 9.8 declined since admission - on low dose Epo as outpt - given Araneps 25 Friday 4. Secondary hyperparathyroidism - on hectorol/no binders P ok 5. HTN/volume - volume control with HD; net UF Friday with post weight - post wt 69.2(bed wt below edw ) Next HD due Monday 6. Nutrition - added renavite/nepro to [place] diet - rec adv diet  Alan Jacobson, PA-C [facility] Beeper [phone] 09/02/2015,8:45 AM  LOS: 5 days   Subjective:   No c/o - ate part of breakfast; no problems with HD Friday  Objective Filed Vitals:   09/01/15 2115 09/01/15 2135 09/02/15 0500 09/02/15 0753  BP: 129/50 129/50 122/47 126/45  Pulse: 75  73 75  Temp: 98.3 F (36.8 C)  98.2 F (36.8 C) 98.9 F (37.2 C)  TempSrc: Oral  Oral Oral  Resp: 16  16 17   Weight: 70.8 kg (156 lb 1.4 oz)  70.8 kg (156 lb 1.4 oz)   SpO2: 94%  98% 99%   Physical Exam General: NAD alert and conversant Heart: RRR Lungs: no rales Abdomen: + BS soft NT Extremities: bilat BKA - no edema Dialysis Access: left lower AVF + bruit  Dialysis Orders: [facility] MWF 4h 73kg Hep 1000 + 1K/ hr LFA AVF 35 deg (helps w hypotension) EPO 2400 tiw, Hect 0.5 tiw  Additional Objective Labs: Basic Metabolic Panel:  Recent Labs Lab 08/28/15 1158 08/29/15 0235 08/30/15 0324 09/01/15 0500  NA 135 131* 135 134*  K 3.9 4.0 3.8 3.9  CL 105 100* 103 99*  CO2 16* 17* 24 26  GLUCOSE 114* 120* 81 122*  BUN 52* 60* 33* 17  CREATININE 5.31* 5.44* 4.15* 3.85*  CALCIUM 7.1* 7.0* 7.2* 7.7*  PHOS 4.4 4.7*  --   --    Liver Function Tests:  Recent Labs Lab 08/27/15 2054 08/28/15 1158  AST 26  --   ALT 16*  --   ALKPHOS 208*  --   BILITOT 1.2  --   PROT 6.5   --   ALBUMIN 2.7* 2.0*    Recent Labs Lab 08/27/15 2054  LIPASE 22   CBC:  Recent Labs Lab 08/27/15 2054 08/28/15 1158 08/29/15 0235 08/30/15 0324 09/01/15 0500  WBC 15.8* 10.9* 10.5 6.5 6.0  NEUTROABS 13.0*  --   --   --   --   HGB 13.4 11.4* 10.9* 9.4* 9.8*  HCT 41.1 35.5* 33.5* 29.1* 29.9*  MCV 94.3 93.9 92.3 91.2 94.0  PLT 234 217 196 161 160   Blood Culture    Component Value Date/Time   SDES URINE, CATHETERIZED 08/27/2015 2145   SPECREQUEST NONE 08/27/2015 2145   CULT >=100,000 COLONIES/mL KLEBSIELLA PNEUMONIAE* 08/27/2015 2145   REPTSTATUS 08/30/2015 FINAL 08/27/2015 2145    Cardiac Enzymes:  Recent Labs Lab 08/28/15 1158 08/28/15 1808  TROPONINI <0.03 0.03*   CBG:  Recent Labs Lab 08/31/15 2030 09/01/15 0743 09/01/15 1126 09/01/15 1710 09/01/15 2120  GLUCAP 160* 120* 157* 105* 209*    Lab Results  Component Value Date   INR 1.07 08/27/2015   INR 1.15 04/09/2013   INR 1.11 05/22/2012  Medications:   . amLODipine  5 mg Oral Daily  .  ciprofloxacin  500 mg Oral Daily  . darbepoetin (ARANESP) injection - DIALYSIS  25 mcg Intravenous Q Fri-HD  . doxercalciferol  0.5 mcg Intravenous Q M,W,F-HD  . feeding supplement (NEPRO CARB STEADY)  237 mL Oral BID BM  . heparin  5,000 Units Subcutaneous Q8H  . insulin aspart  0-9 Units Subcutaneous TID WC  . insulin glargine  8 Units Subcutaneous QPM  . lisinopril  5 mg Oral Daily  . multivitamin  1 tablet Oral QHS  . tamsulosin  0.4 mg Oral Daily

## 2015-09-02 NOTE — Progress Notes (Signed)
PROGRESS NOTE    Alan [name]  FS:[id number] DOB: 04-18-1940 DOA: 08/27/2015 PCP: [facility]    Brief Narrative:  This is a pleasant 75 y/o male with peripheral arterial disease and ESRD who presented to the [facility] on 7/9 with sharp right sided abdominal pain. He says that the pain came on suddenly on 7/9 and feels similar to multiple prior bladder infections he has experienced in the past. He had some non-bloody vomitous with this, no diarrhea or hematochezia. In the ER he was noted to have hypotension so was given IVF and antibiotics and was transferred to [facility] for further management.   Assessment & Plan:  Acute Colitis - Patient is much improved, no further diarrhea (is having movement of gas), tolerated a regular diet today without nausea, vomiting.  - Repeat LA normal - On Ciprofloxacin, which is also covering his UTI  Septic Shock 2/2 infectious colitis and klebsiella UTI - UC reviewed, klebsiella is sensitive to ciprofloxacin - Day 4 of 10 of Abx - Continue ciprofloxacin  ESRD on HD - HD done yesterday - Resume HD at home regimen, MWF on discharge  Microscopic hematuria - Urology recommended tamsulosin and f/u outpatient - Tamsulosin started and Roosevelt be prescribed on discharge - Advised patient to call Monday for urology follow up    DM type 2 causing vascular disease  - Home lantus resumed yesterday - SSI - Continue home meds on discharge    Protein-calorie malnutrition, severe - Continue nepro supplementation  Pulmonary nodule - Seen on imaging, per recommendation given the size, recommended a 3 month follow up chest CT, [place] need to call PCP on Monday and confirm follow up for him.     S/P bilateral BKA (below knee amputation)  - No issues while in house, transports himself in a wheelchair    Essential hypertension - BP improved with initiation of home meds - Continue amlodipine, lisinopril and lasix on discharge   DVT  prophylaxis: Heparin SQ Code Status:  Full   Disposition Plan: Discharge today with Antibiotics   Consultants:   Nephrology  Urology - phone consult with [facility] - outpatient follow up  Procedures:   None  Antimicrobials:   Zosyn 7/10 transitioned to Cipro 7/12 --> to complete a 10 day course.    Subjective: Alan [name] reports doing well. No further nausea or vomiting.  He did well with soft diet.  He is on oral antibiotics and tolerating them.    Objective: Filed Vitals:   09/01/15 2115 09/01/15 2135 09/02/15 0500 09/02/15 0753  BP: 129/50 129/50 122/47 126/45  Pulse: 75  73 75  Temp: 98.3 F (36.8 C)  98.2 F (36.8 C) 98.9 F (37.2 C)  TempSrc: Oral  Oral Oral  Resp: 16  16 17   Weight: 156 lb 1.4 oz (70.8 kg)  156 lb 1.4 oz (70.8 kg)   SpO2: 94%  98% 99%    Intake/Output Summary (Last 24 hours) at 09/02/15 1052 Last data filed at 09/02/15 1016  Gross per 24 hour  Intake    540 ml  Output   2127 ml  Net  -1587 ml   Filed Weights   09/01/15 1600 09/01/15 2115 09/02/15 0500  Weight: 152 lb 8.9 oz (69.2 kg) 156 lb 1.4 oz (70.8 kg) 156 lb 1.4 oz (70.8 kg)    Examination:  General exam: Appears calm and comfortable, awake and alert Respiratory system: Respiratory effort normal.  No audible wheezing Cardiovascular system: S1 &  S2 heard, RR. No pedal edema. Gastrointestinal system: Abdomen is nondistended, soft and nontender. Normal bowel sounds heard. Central nervous system: Alert and oriented. No focal neurological deficits. Extremities: Symmetric 5 x 5 power.  LUE fistula with bandages from HD yesterday.  Skin: No rashes, lesions or ulcers Psychiatry: Judgement and insight appear normal. Mood & affect appropriate.     Data Reviewed: I have personally reviewed following labs and imaging studies  CBC:  Recent Labs Lab 08/27/15 2054 08/28/15 1158 08/29/15 0235 08/30/15 0324 09/01/15 0500  WBC 15.8* 10.9* 10.5 6.5 6.0  NEUTROABS 13.0*  --   --    --   --   HGB 13.4 11.4* 10.9* 9.4* 9.8*  HCT 41.1 35.5* 33.5* 29.1* 29.9*  MCV 94.3 93.9 92.3 91.2 94.0  PLT 234 217 196 161 0000000   Basic Metabolic Panel:  Recent Labs Lab 08/27/15 2054 08/28/15 1158 08/29/15 0235 08/30/15 0324 09/01/15 0500  NA 135 135 131* 135 134*  K 3.7 3.9 4.0 3.8 3.9  CL 98* 105 100* 103 99*  CO2 26 16* 17* 24 26  GLUCOSE 143* 114* 120* 81 122*  BUN 44* 52* 60* 33* 17  CREATININE 4.98* 5.31* 5.44* 4.15* 3.85*  CALCIUM 8.0* 7.1* 7.0* 7.2* 7.7*  MG  --  2.4 2.3  --   --   PHOS  --  4.4 4.7*  --   --    GFR: Estimated Creatinine Clearance: 16.3 mL/min (by C-G formula based on Cr of 3.85). Liver Function Tests:  Recent Labs Lab 08/27/15 2054 08/28/15 1158  AST 26  --   ALT 16*  --   ALKPHOS 208*  --   BILITOT 1.2  --   PROT 6.5  --   ALBUMIN 2.7* 2.0*    Recent Labs Lab 08/27/15 2054  LIPASE 22   No results for input(s): AMMONIA in the last 168 hours. Coagulation Profile:  Recent Labs Lab 08/27/15 2054  INR 1.07   Cardiac Enzymes:  Recent Labs Lab 08/28/15 1158 08/28/15 1808  TROPONINI <0.03 0.03*   BNP (last 3 results) No results for input(s): PROBNP in the last 8760 hours. HbA1C: No results for input(s): HGBA1C in the last 72 hours. CBG:  Recent Labs Lab 09/01/15 0743 09/01/15 1126 09/01/15 1710 09/01/15 2120 09/02/15 0753  GLUCAP 120* 157* 105* 209* 146*   Lipid Profile: No results for input(s): CHOL, HDL, LDLCALC, TRIG, CHOLHDL, LDLDIRECT in the last 72 hours. Thyroid Function Tests: No results for input(s): TSH, T4TOTAL, FREET4, T3FREE, THYROIDAB in the last 72 hours. Anemia Panel: No results for input(s): VITAMINB12, FOLATE, FERRITIN, TIBC, IRON, RETICCTPCT in the last 72 hours. Sepsis Labs:  Recent Labs Lab 08/28/15 0028 08/28/15 0329 08/28/15 1158 08/28/15 1532  LATICACIDVEN 5.23* 6.74* 5.8* 3.3*    Recent Results (from the past 240 hour(s))  Culture, blood (routine x 2)     Status: None    Collection Time: 08/27/15  8:54 PM  Result Value Ref Range Status   Specimen Description RIGHT ANTECUBITAL  Final   Special Requests BOTTLES DRAWN AEROBIC AND ANAEROBIC Williamson  Final   Culture NO GROWTH 6 DAYS  Final   Report Status 09/02/2015 FINAL  Final  Culture, blood (routine x 2)     Status: None   Collection Time: 08/27/15  9:17 PM  Result Value Ref Range Status   Specimen Description RIGHT ANTECUBITAL  Final   Special Requests BOTTLES DRAWN AEROBIC ONLY 4CC  Final   Culture NO GROWTH  6 DAYS  Final   Report Status 09/02/2015 FINAL  Final  Urine culture     Status: Abnormal   Collection Time: 08/27/15  9:45 PM  Result Value Ref Range Status   Specimen Description URINE, CATHETERIZED  Final   Special Requests NONE  Final   Culture >=100,000 COLONIES/mL KLEBSIELLA PNEUMONIAE (A)  Final   Report Status 08/30/2015 FINAL  Final   Organism ID, Bacteria KLEBSIELLA PNEUMONIAE (A)  Final      Susceptibility   Klebsiella pneumoniae - MIC*    AMPICILLIN 16 RESISTANT Resistant     CEFAZOLIN <=4 SENSITIVE Sensitive     CEFTRIAXONE <=1 SENSITIVE Sensitive     CIPROFLOXACIN <=0.25 SENSITIVE Sensitive     GENTAMICIN <=1 SENSITIVE Sensitive     IMIPENEM <=0.25 SENSITIVE Sensitive     NITROFURANTOIN 64 INTERMEDIATE Intermediate     TRIMETH/SULFA <=20 SENSITIVE Sensitive     AMPICILLIN/SULBACTAM 8 SENSITIVE Sensitive     PIP/TAZO 16 SENSITIVE Sensitive     * >=100,000 COLONIES/mL KLEBSIELLA PNEUMONIAE  C difficile quick scan w PCR reflex     Status: None   Collection Time: 08/28/15  4:41 AM  Result Value Ref Range Status   C Diff antigen NEGATIVE NEGATIVE Final   C Diff toxin NEGATIVE NEGATIVE Final   C Diff interpretation No C. difficile detected.  Final  MRSA PCR Screening     Status: None   Collection Time: 08/28/15  5:43 AM  Result Value Ref Range Status   MRSA by PCR NEGATIVE NEGATIVE Final    Comment:        The GeneXpert MRSA Assay (FDA approved for NASAL specimens only),  is one component of a comprehensive MRSA colonization surveillance program. It is not intended to diagnose MRSA infection nor to guide or monitor treatment for MRSA infections.          Radiology Studies: No results found.      Scheduled Meds: . amLODipine  5 mg Oral Daily  . ciprofloxacin  500 mg Oral Daily  . darbepoetin (ARANESP) injection - DIALYSIS  25 mcg Intravenous Q Fri-HD  . doxercalciferol  0.5 mcg Intravenous Q M,W,F-HD  . feeding supplement (NEPRO CARB STEADY)  237 mL Oral BID BM  . heparin  5,000 Units Subcutaneous Q8H  . insulin aspart  0-9 Units Subcutaneous TID WC  . insulin glargine  8 Units Subcutaneous QPM  . lisinopril  5 mg Oral Daily  . multivitamin  1 tablet Oral QHS  . tamsulosin  0.4 mg Oral Daily   Continuous Infusions:    LOS: 5 days    Time spent: 40 minutes    Gilles Chiquito, MD [facility] Pager [phone]  If 7PM-7AM, please contact night-coverage [email or web address] Password [facility] 09/02/2015, 10:52 AM

## 2015-09-02 NOTE — Discharge Instructions (Signed)
You had a pulmonary nodule on a scan of your abdomen.  You should discuss this with your primary care provider!

## 2015-09-02 NOTE — Progress Notes (Signed)
Discharge instructions and medications discussed with patient & wife. All questions answered.

## 2015-09-02 NOTE — Discharge Summary (Signed)
**Note DeFisherIdentified via Obfuscation** Physician Discharge Summary  [name] [id number] DOB: [birth year] DOA: 08/27/2015  PCP: [name]  Admit date: 08/27/2015 Discharge date: 09/04/2015  Admitted From:  Home Disposition:  Home  Recommendations for Outpatient FollowFisherup:  1. Follow up with PCP in 1[name]2 weeks 2. Please obtain BMP/CBC in one week 3. Please have patient follow up with Urology in next 1[name]2 weeks.   Home Health: No Equipment/Devices: wheelchair  Discharge Condition: Stable CODE STATUS: Full Diet recommendation: Diabetic/Carb modified   Brief/Interim Summary: This is a pleasant 75 y/o male with peripheral arterial disease and ESRD who presented to the [facility] on 7/9 with sharp right sided abdominal pain. He says that the pain came on suddenly on 7/9 and feels similar to multiple prior bladder infections he has experienced in the past. He had some nonFisherbloody vomitous with this, no diarrhea or hematochezia. In the ER he was noted to have hypotension so was given IVF and antibiotics and was transferred to [name] for further management.   Discharge Diagnoses:  Acute Colitis: Initially presented with nausea, vomiting, diarrhea. CT scan showed pericolonic inflammation and he was initially treated with Zosyn. As he improved, he was transitioned to ciprofloxacin and discharged on this medications.   Septic Shock 2/2 infectious colitis and klebsiella UTI: He was initially admitted to the ICU.  He required pressors for 2 days, did not require intubation.  He had a lactic acidosis which peaked around 6 and improved with fluids and treatment of his acute infections.  Urine culture obtained showed Klebsiella which was pansensitive on 08/27/15.  BC X 2 were negative to date.   He was transitioned from Zosyn to Ciprofloxacin on 7/12.  His course of Abx needed to run for 10 days, stop date of 09/06/15.  BP was much improved by the time of discharge and he was restarted on his home medications of lasix,  lisinopril and amlodipine.   ESRD on HD: Nephrology was consulted during admission and he was maintained on his HD schedule once he could tolerate it and acute infections improved.  He was discharged with plan to continue on HD as scheduled.   Microscopic hematuria: Noted to have large hemoglobin on urinalysis.  Hospitalist team spoke with Urology over phone based on chart review and were recommended to treat acute UTI, start tamsulosin and have patient follow up with urology outpatient.    DM type 2 causing vascular disease: No acute issues, required SSI while in the hospital.  Home Lantus was started on discharge.    ProteinFishercalorie malnutrition, severe:  Continue nepro supplementation  Pulmonary nodule:  Seen on imaging, per recommendation given the size, recommended a 3 month follow up chest CT.  Defer to primary care.  [name].    S/P bilateral BKA (below knee amputation): No issues while in house, transports himself in a wheelchair   Essential hypertension:  Initially with shock, however, as his infections improved his blood pressure rebounded.  His home medications were restarted.  Continue amlodipine, lisinopril and lasix on discharge.    Discharge Instructions  Discharge Instructions    Call MD for:  difficulty breathing, headache or visual disturbances    Complete by:  As directed      Call MD for:  extreme fatigue    Complete by:  As directed      Call MD for:  persistant dizziness or lightFisherheadedness    Complete by:  As directed      Call  MD for:  persistant nausea and vomiting    Complete by:  As directed      Call MD for:  redness, tenderness, or signs of infection (pain, swelling, redness, odor or green/yellow discharge around incision site)    Complete by:  As directed      Call MD for:  severe uncontrolled pain    Complete by:  As directed      Call MD for:  temperature >100.4    Complete by:  As directed      Diet - low  sodium heart healthy    Complete by:  As directed      Discharge instructions    Complete by:  As directed   [name] - -   You were admitted for an infection in your abdomen (colitis) and a urinary tract infection.  You [name] go home on an antibiotic called Ciprofloxacin.  Please take this medication for 6 more days.  It is VERY IMPORTANT that you do not miss any doses.   You were also noted to have blood in your urine.  Your Urologist recommended starting you on a medication called tamsulosin (Flomax).  This was sent to your pharmacy.  Please start taking this as directed.   Follow with Primary [name] in 7 days   Get CBC, CMP with your primary doctor in the next week ( we routinely change or add medications that can affect your baseline labs and fluid status, therefore we recommend that you get the mentioned basic workup next visit with your PCP, your PCP may decide not to get them or add new tests based on their clinical decision)  Please go for your dialysis session as planned on Monday.   Activity: As tolerated with assistance as needed   Disposition Home    Diet:   Heart Healthy   On your next visit with your primary care physician please Get Medicines reviewed and adjusted.    If you experience worsening of your admission symptoms, develop shortness of breath, life threatening emergency, suicidal or homicidal thoughts you must seek medical attention immediately by calling 911 or calling your MD immediately  if symptoms less severe.  You Must read complete instructions/literature along with all the possible adverse reactions/side effects for all the Medicines you take and that have been prescribed to you. Take any new Medicines after you have completely understood and accpet all the possible adverse reactions/side effects.   Do not take more than prescribed Pain, Sleep and Anxiety Medications  Wear Seat belts while driving.   Please  note  You were cared for by a hospitalist during your hospital stay. If you have any questions about your discharge medications or the care you received while you were in the hospital after you are discharged, you can call the unit and asked to speak with the hospitalist on call if the hospitalist that took care of you is not available. Once you are discharged, your primary care physician Prathik handle any further medical issues. Please note that NO REFILLS for any discharge medications [place] be authorized once you are discharged, as it is imperative that you return to your primary care physician (or establish a relationship with a primary care physician if you do not have one) for your aftercare needs so that they can reassess your need for medications and monitor your lab values.     Increase activity slowly    Complete by:  As directed  Medication List    STOP taking these medications        cefUROXime 500 MG tablet  Commonly known as:  CEFTIN      TAKE these medications        acetaminophen 500 MG tablet  Commonly known as:  TYLENOL  Take 500 mg by mouth 2 (two) times daily.     amLODipine 5 MG tablet  Commonly known as:  NORVASC  Take 1 tablet (5 mg total) by mouth daily.     aspirin EC 81 MG tablet  Take 81 mg by mouth at bedtime.     budesonideFisherformoterol 160[name]4.5 MCG/ACT inhaler  Commonly known as:  SYMBICORT  Inhale 2 puffs into the lungs 2 (two) times daily.     calcium acetate 667 MG capsule  Commonly known as:  PHOSLO  Take 1 capsule (667 mg total) by mouth 3 (three) times daily with meals.     cinacalcet 30 MG tablet  Commonly known as:  SENSIPAR  Take 30 mg by mouth daily.     ciprofloxacin 500 MG tablet  Commonly known as:  CIPRO  Take 1 tablet (500 mg total) by mouth daily. For 6 more days     Co QFisher10 50 MG Caps  Take 1 capsule by mouth daily.     furosemide 40 MG tablet  Commonly known as:  LASIX  TAKE 1/2 TABLET BY MOUTH EVERY EVENING      LANTUS SOLOSTAR 100 UNIT/ML Solostar Pen  Generic drug:  Insulin Glargine  Inject 8 Units into the skin every evening.     lisinopril 5 MG tablet  Commonly known as:  PRINIVIL,ZESTRIL  Take 1 tablet (5 mg total) by mouth daily.     NEPHROFisherVITE PO  Take 1 tablet by mouth every evening.     nitroGLYCERIN 0.4 MG SL tablet  Commonly known as:  NITROSTAT  Place 1 tablet (0.4 mg total) under the tongue every 5 (five) minutes as needed for chest pain.     omeprazole 20 MG capsule  Commonly known as:  PRILOSEC  Take 1 capsule (20 mg total) by mouth 2 (two) times daily before a meal.     ondansetron 4 MG tablet  Commonly known as:  ZOFRAN  Take 1 tablet (4 mg total) by mouth every 8 (eight) hours as needed for nausea or vomiting.     PROSOL 20 % Soln  every Monday, Wednesday, and Friday.     tamsulosin 0.4 MG Caps capsule  Commonly known as:  FLOMAX  Take 1 capsule (0.4 mg total) by mouth daily.           FollowFisherup Information    Follow up with [name] In 1 week.   Contact information:   [address] [place] [address] [phone]       Follow up with Nicolette Bang, MD In 2 weeks.   Specialty:  Urology   Contact information:   [address] [place] [address] [name]      Allergies  Allergen Reactions  . Codeine Itching and Nausea And Vomiting  . [place] Venom [Bee Venom] Swelling    Consultations:  PCCM  Nephrology   Procedures/Studies: Dg Abd 1 View  08/13/2015  CLINICAL DATA:  Nausea and vomiting. EXAM: ABDOMEN - 1 VIEW COMPARISON:  CT of the abdomen and pelvis on 04/12/2015 FINDINGS: No evidence of bowel obstruction or significant ileus. Advanced spondylosis of the lumbar spine present with associated  leftward convex scoliosis. There likely is a small left pleural effusion. Advanced degenerative disease of the right hip joint. IMPRESSION: Unremarkable bowel gas pattern. Probable small left pleural effusion.  Electronically Signed   By: Aletta Edouard M.D.   On: 08/13/2015 11:40   Dg Chest Portable 1 View  08/27/2015  CLINICAL DATA:  Gradually worsening chest pain, leftFishersided, beginning earlier today. EXAM: PORTABLE CHEST 1 VIEW COMPARISON:  08/12/2015.  05/12/2015. FINDINGS: Poor inspiration. Previous CABG. Chronic elevation of the hemidiaphragms with chronic scarring at the left lung base. No evidence of any active process or change. No edema. No effusions. IMPRESSION: Previous CABG.  Poor inspiration.  Chronic left lung scarring. Electronically Signed   By: Nelson Chimes M.D.   On: 08/27/2015 21:18   Dg Chest Portable 1 View  08/12/2015  CLINICAL DATA:  Vomiting, weakness, pain with urination, cough EXAM: PORTABLE CHEST 1 VIEW COMPARISON:  05/12/2015 FINDINGS: Cardiomediastinal silhouette is stable. No acute infiltrate or pleural effusion. No pulmonary edema. Degenerative changes bilateral shoulders again noted. Dextroscoliosis lower thoracic spine. IMPRESSION: No active disease. Electronically Signed   By: Lahoma Crocker M.D.   On: 08/12/2015 17:29   Ct Renal Stone Study  08/28/2015  CLINICAL DATA:  Left flank pain beginning this afternoon and gradually worsening. Sepsis. EXAM: CT ABDOMEN AND PELVIS WITHOUT CONTRAST TECHNIQUE: Multidetector CT imaging of the abdomen and pelvis was performed following the standard protocol without IV contrast. COMPARISON:  04/12/2015 FINDINGS: Dependent atelectasis and fibrosis in the lung bases. Right lung base nodule measuring 10 mm diameter. This was not seen on the prior study but may not have been included within the field of view. The nodule is noncalcified and indeterminate. Consider one of the following in 3 months for both lowFisherrisk and highFisherrisk individuals: (a) repeat chest CT, (b) followFisherup PETFisherCT, or (c) tissue sampling. This recommendation follows the consensus statement: Guidelines for Management of Incidental Pulmonary Nodules Detected on CT Images:From the  Fleischner Society 2017; published online before print (10.1148/radiol.IJ:2314499). Scattered calcified granulomas in the liver. Surgical absence of the gallbladder. No bile duct dilatation. The unenhanced appearance of the pancreas, spleen, adrenal glands, kidneys, inferior vena cava, and abdominal aorta is unremarkable. Small esophageal hiatal hernia. Small bowel are nondistended. There is diffuse dilatation of the colon, filled with stool and fluid. This could represent changes due to diarrhea or constipation. There is stranding in the pericolonic fat around the descending colon which may also indicate colitis. No free air or free fluid in the abdomen. Subxiphoid hernias containing fat. Pelvis: Appendix is not identified. Prostate gland is enlarged, measuring about 4.7 cm diameter. No free fluid in the pelvis. No significant pelvic lymphadenopathy. Postoperative changes in the anterior abdominal wall. Diffuse degenerative change throughout the thoracic and lumbar spine with thoracic and lumbar scoliosis. Severe degenerative changes in the right hip possibly indicating right hip dysplasia. IMPRESSION: 10 mm right lung base nodule. See above recommendations for followFisherup. Diffusely distended colon which is filled with stool and fluid. Pericolonic fatty infiltration around the descending region. Consider constipation, diarrhea, and/or colitis. Electronically Signed   By: Lucienne Capers M.D.   On: 08/28/2015 01:17      Subjective:   Discharge Exam: Filed Vitals:   09/02/15 0753 09/02/15 1715  BP: 126/45 130/49  Pulse: 75 86  Temp: 98.9 F (37.2 C) 98.4 F (36.9 C)  Resp: 17 17   Filed Vitals:   09/01/15 2135 09/02/15 0500 09/02/15 0753 09/02/15 1715  BP: 129/50 122/47 126/45 130/49  Pulse:  73 75 86  Temp:  98.2 F (36.8 C) 98.9 F (37.2 C) 98.4 F (36.9 C)  TempSrc:  Oral Oral Oral  Resp:  16 17 17   Weight:  156 lb 1.4 oz (70.8 kg)    SpO2:  98% 99% 99%    General exam: Appears calm  and comfortable, awake and alert Respiratory system: Respiratory effort normal. No audible wheezing Cardiovascular system: S1 & S2 heard, RR. No pedal edema. Gastrointestinal system: Abdomen is nondistended, soft and nontender. +BS Central nervous system: Alert and oriented. No focal neurological deficits. Extremities: Symmetric 5 x 5 power. Bilateral amputee in the lower extremities.   Skin: No rashes, lesions or ulcers Psychiatry: Judgement and insight appear normal. Mood & affect appropriate.     The results of significant diagnostics from this hospitalization (including imaging, microbiology, ancillary and laboratory) are listed below for reference.     Microbiology: Recent Results (from the past 240 hour(s))  Culture, blood (routine x 2)     Status: None   Collection Time: 08/27/15  8:54 PM  Result Value Ref Range Status   Specimen Description RIGHT ANTECUBITAL  Final   Special Requests BOTTLES DRAWN AEROBIC AND ANAEROBIC 8CC EACH  Final   Culture NO GROWTH 6 DAYS  Final   Report Status 09/02/2015 FINAL  Final  Culture, blood (routine x 2)     Status: None   Collection Time: 08/27/15  9:17 PM  Result Value Ref Range Status   Specimen Description RIGHT ANTECUBITAL  Final   Special Requests BOTTLES DRAWN AEROBIC ONLY 4CC  Final   Culture NO GROWTH 6 DAYS  Final   Report Status 09/02/2015 FINAL  Final  Urine culture     Status: Abnormal   Collection Time: 08/27/15  9:45 PM  Result Value Ref Range Status   Specimen Description URINE, CATHETERIZED  Final   Special Requests NONE  Final   Culture >=100,000 COLONIES/mL KLEBSIELLA PNEUMONIAE (A)  Final   Report Status 08/30/2015 FINAL  Final   Organism ID, Bacteria KLEBSIELLA PNEUMONIAE (A)  Final      Susceptibility   Klebsiella pneumoniae - MIC*    AMPICILLIN 16 RESISTANT Resistant     CEFAZOLIN <=4 SENSITIVE Sensitive     CEFTRIAXONE <=1 SENSITIVE Sensitive     CIPROFLOXACIN <=0.25 SENSITIVE Sensitive     GENTAMICIN <=1  SENSITIVE Sensitive     IMIPENEM <=0.25 SENSITIVE Sensitive     NITROFURANTOIN 64 INTERMEDIATE Intermediate     TRIMETH/SULFA <=20 SENSITIVE Sensitive     AMPICILLIN/SULBACTAM 8 SENSITIVE Sensitive     PIP/TAZO 16 SENSITIVE Sensitive     * >=100,000 COLONIES/mL KLEBSIELLA PNEUMONIAE  C difficile quick scan w PCR reflex     Status: None   Collection Time: 08/28/15  4:41 AM  Result Value Ref Range Status   C Diff antigen NEGATIVE NEGATIVE Final   C Diff toxin NEGATIVE NEGATIVE Final   C Diff interpretation No C. difficile detected.  Final  MRSA PCR Screening     Status: None   Collection Time: 08/28/15  5:43 AM  Result Value Ref Range Status   MRSA by PCR NEGATIVE NEGATIVE Final    Comment:        The GeneXpert MRSA Assay (FDA approved for NASAL specimens only), is one component of a comprehensive MRSA colonization surveillance program. It is not intended to diagnose MRSA infection nor to guide or monitor treatment for MRSA infections.      Labs: BNP (last  3 results)  Recent Labs  08/12/15 1731  BNP [id number]*   Basic Metabolic Panel:  Recent Labs Lab 08/29/15 0235 08/30/15 0324 09/01/15 0500 09/02/15 1029  NA 131* 135 134* 139  K 4.0 3.8 3.9 3.8  CL 100* 103 99* 104  CO2 17* 24 26 29   GLUCOSE 120* 81 122* 144*  BUN 60* 33* 17 7  CREATININE 5.44* 4.15* 3.85* 3.08*  CALCIUM 7.0* 7.2* 7.7* 7.8*  MG 2.3  --   --   --   PHOS 4.7*  --   --   --    Liver Function Tests: No results for input(s): AST, ALT, ALKPHOS, BILITOT, PROT, ALBUMIN in the last 168 hours. No results for input(s): LIPASE, AMYLASE in the last 168 hours. No results for input(s): AMMONIA in the last 168 hours. CBC:  Recent Labs Lab 08/29/15 0235 08/30/15 0324 09/01/15 0500  WBC 10.5 6.5 6.0  HGB 10.9* 9.4* 9.8*  HCT 33.5* 29.1* 29.9*  MCV 92.3 91.2 94.0  PLT 196 161 160   Cardiac Enzymes:  Recent Labs Lab 08/28/15 1808  TROPONINI 0.03*   BNP: Invalid input(s):  POCBNP CBG:  Recent Labs Lab 09/01/15 1710 09/01/15 2120 09/02/15 0753 09/02/15 1204 09/02/15 1714  GLUCAP 105* 209* 146* 135* 175*   DFisherDimer No results for input(s): DDIMER in the last 72 hours. Hgb A1c No results for input(s): HGBA1C in the last 72 hours. Lipid Profile No results for input(s): CHOL, HDL, LDLCALC, TRIG, CHOLHDL, LDLDIRECT in the last 72 hours. Thyroid function studies No results for input(s): TSH, T4TOTAL, T3FREE, THYROIDAB in the last 72 hours.  Invalid input(s): FREET3 Anemia work up No results for input(s): VITAMINB12, FOLATE, FERRITIN, TIBC, IRON, RETICCTPCT in the last 72 hours. Urinalysis    Component Value Date/Time   COLORURINE YELLOW 08/27/2015 2145   APPEARANCEUR TURBID* 08/27/2015 2145   LABSPEC 1.020 08/27/2015 2145   PHURINE 6.0 08/27/2015 2145   GLUCOSEU NEGATIVE 08/27/2015 2145   HGBUR LARGE* 08/27/2015 2145   BILIRUBINUR SMALL* 08/27/2015 2145   KETONESUR NEGATIVE 08/27/2015 2145   PROTEINUR >300* 08/27/2015 2145   UROBILINOGEN 4.0* 11/05/2014 1930   NITRITE NEGATIVE 08/27/2015 2145   LEUKOCYTESUR LARGE* 08/27/2015 2145   Sepsis Labs Invalid input(s): PROCALCITONIN,  WBC,  LACTICIDVEN Microbiology Recent Results (from the past 240 hour(s))  Culture, blood (routine x 2)     Status: None   Collection Time: 08/27/15  8:54 PM  Result Value Ref Range Status   Specimen Description RIGHT ANTECUBITAL  Final   Special Requests BOTTLES DRAWN AEROBIC AND ANAEROBIC [place]  Final   Culture NO GROWTH 6 DAYS  Final   Report Status 09/02/2015 FINAL  Final  Culture, blood (routine x 2)     Status: None   Collection Time: 08/27/15  9:17 PM  Result Value Ref Range Status   Specimen Description RIGHT ANTECUBITAL  Final   Special Requests BOTTLES DRAWN AEROBIC ONLY 4CC  Final   Culture NO GROWTH 6 DAYS  Final   Report Status 09/02/2015 FINAL  Final  Urine culture     Status: Abnormal   Collection Time: 08/27/15  9:45 PM  Result Value Ref  Range Status   Specimen Description URINE, CATHETERIZED  Final   Special Requests NONE  Final   Culture >=100,000 COLONIES/mL KLEBSIELLA PNEUMONIAE (A)  Final   Report Status 08/30/2015 FINAL  Final   Organism ID, Bacteria KLEBSIELLA PNEUMONIAE (A)  Final      Susceptibility   Klebsiella pneumoniae -  MIC*    AMPICILLIN 16 RESISTANT Resistant     CEFAZOLIN <=4 SENSITIVE Sensitive     CEFTRIAXONE <=1 SENSITIVE Sensitive     CIPROFLOXACIN <=0.25 SENSITIVE Sensitive     GENTAMICIN <=1 SENSITIVE Sensitive     IMIPENEM <=0.25 SENSITIVE Sensitive     NITROFURANTOIN 64 INTERMEDIATE Intermediate     TRIMETH/SULFA <=20 SENSITIVE Sensitive     AMPICILLIN/SULBACTAM 8 SENSITIVE Sensitive     PIP/TAZO 16 SENSITIVE Sensitive     * >=100,000 COLONIES/mL KLEBSIELLA PNEUMONIAE  C difficile quick scan w PCR reflex     Status: None   Collection Time: 08/28/15  4:41 AM  Result Value Ref Range Status   C Diff antigen NEGATIVE NEGATIVE Final   C Diff toxin NEGATIVE NEGATIVE Final   C Diff interpretation No C. difficile detected.  Final  MRSA PCR Screening     Status: None   Collection Time: 08/28/15  5:43 AM  Result Value Ref Range Status   MRSA by PCR NEGATIVE NEGATIVE Final    Comment:        The GeneXpert MRSA Assay (FDA approved for NASAL specimens only), is one component of a comprehensive MRSA colonization surveillance program. It is not intended to diagnose MRSA infection nor to guide or monitor treatment for MRSA infections.      Time coordinating discharge: 40 minutes  SIGNED:   Gilles Chiquito, MD  [facility] 09/04/2015, 1:30 PM Pager [name]  If 7PMFisher7AM, please contact nightFishercoverage [email or web address] Password [email or web address]

## 2015-09-04 ENCOUNTER — Emergency Department (HOSPITAL_COMMUNITY): Payer: Medicare Other

## 2015-09-04 ENCOUNTER — Emergency Department (HOSPITAL_COMMUNITY)
Admission: EM | Admit: 2015-09-04 | Discharge: 2015-09-04 | Disposition: A | Discharge status: Home / Self Care | Payer: Medicare Other | Attending: Emergency Medicine | Admitting: Emergency Medicine

## 2015-09-04 ENCOUNTER — Encounter (HOSPITAL_COMMUNITY): Payer: Self-pay

## 2015-09-04 DIAGNOSIS — E119 Type 2 diabetes mellitus without complications: Secondary | ICD-10-CM | POA: Diagnosis not present

## 2015-09-04 DIAGNOSIS — M1990 Unspecified osteoarthritis, unspecified site: Secondary | ICD-10-CM | POA: Insufficient documentation

## 2015-09-04 DIAGNOSIS — Z87891 Personal history of nicotine dependence: Secondary | ICD-10-CM | POA: Insufficient documentation

## 2015-09-04 DIAGNOSIS — Z8673 Personal history of transient ischemic attack (TIA), and cerebral infarction without residual deficits: Secondary | ICD-10-CM | POA: Diagnosis not present

## 2015-09-04 DIAGNOSIS — Z79899 Other long term (current) drug therapy: Secondary | ICD-10-CM | POA: Diagnosis not present

## 2015-09-04 DIAGNOSIS — J45909 Unspecified asthma, uncomplicated: Secondary | ICD-10-CM | POA: Diagnosis not present

## 2015-09-04 DIAGNOSIS — N186 End stage renal disease: Secondary | ICD-10-CM | POA: Insufficient documentation

## 2015-09-04 DIAGNOSIS — Z7982 Long term (current) use of aspirin: Secondary | ICD-10-CM | POA: Diagnosis not present

## 2015-09-04 DIAGNOSIS — Z794 Long term (current) use of insulin: Secondary | ICD-10-CM | POA: Insufficient documentation

## 2015-09-04 DIAGNOSIS — R0789 Other chest pain: Secondary | ICD-10-CM | POA: Insufficient documentation

## 2015-09-04 DIAGNOSIS — I120 Hypertensive chronic kidney disease with stage 5 chronic kidney disease or end stage renal disease: Secondary | ICD-10-CM | POA: Diagnosis not present

## 2015-09-04 DIAGNOSIS — I2510 Atherosclerotic heart disease of native coronary artery without angina pectoris: Secondary | ICD-10-CM | POA: Insufficient documentation

## 2015-09-04 LAB — I-STAT TROPONIN, ED
TROPONIN I, POC: 0 ng/mL (ref 0.00–0.08)
Troponin i, poc: 0.01 ng/mL (ref 0.00–0.08)

## 2015-09-04 LAB — BASIC METABOLIC PANEL
ANION GAP: 3 — AB (ref 5–15)
BUN: 9 mg/dL (ref 6–20)
CALCIUM: 7.4 mg/dL — AB (ref 8.9–10.3)
CHLORIDE: 100 mmol/L — AB (ref 101–111)
CO2: 32 mmol/L (ref 22–32)
Creatinine, Ser: 2.56 mg/dL — ABNORMAL HIGH (ref 0.61–1.24)
GFR calc non Af Amer: 23 mL/min — ABNORMAL LOW (ref >60)
GFR, EST AFRICAN AMERICAN: 26 mL/min — AB (ref >60)
GLUCOSE: 174 mg/dL — AB (ref 65–99)
POTASSIUM: 3.2 mmol/L — AB (ref 3.5–5.1)
Sodium: 135 mmol/L (ref 135–145)

## 2015-09-04 LAB — CBC
HEMATOCRIT: 32.1 % — AB (ref 39.0–52.0)
HEMOGLOBIN: 10.3 g/dL — AB (ref 13.0–17.0)
MCH: 30.6 pg (ref 26.0–34.0)
MCHC: 32.1 g/dL (ref 30.0–36.0)
MCV: 95.3 fL (ref 78.0–100.0)
Platelets: 189 10*3/uL (ref 150–400)
RBC: 3.37 MIL/uL — AB (ref 4.22–5.81)
RDW: 14.5 % (ref 11.5–15.5)
WBC: 10.2 10*3/uL (ref 4.0–10.5)

## 2015-09-04 NOTE — ED Notes (Signed)
Pt was given one NTG and ASA 324 mg by EMS prior to arrival

## 2015-09-04 NOTE — ED Provider Notes (Signed)
CSN: [id number]     Arrival date & time 09/04/15  1506 History   First MD Initiated Contact with Patient 09/04/15 1512     Chief Complaint  Patient presents with  . Chest Pain     (Consider location/radiation/quality/duration/timing/severity/associated sxs/prior Treatment) HPI Pt presents with L-sided chest pain starting roughly 1 hour ago while at hemodialysis. States he was unable to finish. Describes the pain as sharp and worse with deep inspiration and movement. No known trauma. No cough or SOB. No fever or chills. No new lower ext swelling. Pt was d/c from the hospital 2 days ago after being admitted for urosepsis. States he is still taking antibiotics. Given aspirin and nitro by EMS with mild temporary relief.  Past Medical History  Diagnosis Date  . Diabetes mellitus   . Hypertension   . Gout   . Coronary artery disease 2000    Stents [facility]  . Stroke ([place])   . Asthma   . Hypercholesteremia   . Arthritis   . DDD (degenerative disc disease), lumbar   . DDD (degenerative disc disease), cervical   . Gangrene of toe ([place]) Feb. 2015    Right  great    . Gastric ulcer     EGD 2014  . 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 Clotted renal dialysis arteriovenous graft ([place])   . CVA (cerebral infarction)   . ESRD (end stage renal disease) on dialysis ([place])   . 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   Past Surgical History  Procedure Laterality Date  . Coronary artery bypass graft  1996    [facility]  . Below knee leg amputation Bilateral   . Esophagogastroduodenoscopy Left 05/24/2012    [id number] dilated baggy but otherwise a normal/ Small hiatal hernia. Gastric ulcer -S/P biopsy  . Amputation Right 04/10/2013    Procedure: Right Below Knee Amputation;  Surgeon: Newt Minion, MD;  Location: [facility];  Service: Orthopedics;  Laterality: Right;  Right Below Knee Amputation  . Appendectomy    . Peripheral vascular catheterization N/A 08/09/2014    Procedure: A/V  Shuntogram/Fistulagram;  Surgeon: Katha Cabal, MD;  Location: [facility];  Service: Cardiovascular;  Laterality: N/A;  . Peripheral vascular catheterization Left 08/09/2014    Procedure: A/V Shunt Intervention;  Surgeon: Katha Cabal, MD;  Location: [facility];  Service: Cardiovascular;  Laterality: Left;  . Cholecystectomy     Family History  Problem Relation Age of Onset  . Colon cancer Neg Hx   . Cancer Mother    Social History  Substance Use Topics  . Smoking status: Former Smoker    Quit date: 08/08/2004  . Smokeless tobacco: Never Used  . Alcohol Use: No    Review of Systems  Constitutional: Negative for fever and chills.  Respiratory: Negative for cough and shortness of breath.   Cardiovascular: Positive for chest pain. Negative for palpitations and leg swelling.  Gastrointestinal: Negative for nausea, vomiting, abdominal pain and diarrhea.  Musculoskeletal: Negative for back pain, neck pain and neck stiffness.  Skin: Negative for rash and wound.  Neurological: Negative for dizziness, weakness, light-headedness, numbness and headaches.  All other systems reviewed and are negative.     Allergies  Codeine and Yellow jacket venom  Home Medications   Prior to Admission medications   Medication Sig Start Date End Date Taking? Authorizing Provider  acetaminophen (TYLENOL) 500 MG tablet Take 500 mg by mouth 2 (two) times daily.   Yes Historical Provider,  MD  Amino Acid Infusion (PROSOL) 20 % SOLN every Monday, Wednesday, and Friday.  10/27/14  Yes Historical Provider, MD  amLODipine (NORVASC) 5 MG tablet Take 1 tablet (5 mg total) by mouth daily. 08/15/15  Yes Rexene Alberts, MD  aspirin EC 81 MG tablet Take 81 mg by mouth at bedtime.    Yes Historical Provider, MD  B Complex-C-Folic Acid (NEPHRO-VITE PO) Take 1 tablet by mouth every evening.    Yes Historical Provider, MD  budesonide-formoterol (SYMBICORT) 160-4.5 MCG/ACT inhaler Inhale 2 puffs  into the lungs 2 (two) times daily.   Yes Historical Provider, MD  calcium acetate (PHOSLO) 667 MG capsule Take 1 capsule (667 mg total) by mouth 3 (three) times daily with meals. 08/15/15  Yes Rexene Alberts, MD  cinacalcet (SENSIPAR) 30 MG tablet Take 30 mg by mouth daily.   Yes Historical Provider, MD  ciprofloxacin (CIPRO) 500 MG tablet Take 1 tablet (500 mg total) by mouth daily. For 6 more days 09/02/15  Yes Sid Falcon, MD  Coenzyme Q10 (CO Q-10) 50 MG CAPS Take 1 capsule by mouth daily.   Yes Historical Provider, MD  furosemide (LASIX) 40 MG tablet TAKE 1/2 TABLET BY MOUTH EVERY EVENING 05/22/15  Yes Lendon Colonel, NP  LANTUS SOLOSTAR 100 UNIT/ML Solostar Pen Inject 8 Units into the skin every evening.  12/21/13  Yes Historical Provider, MD  lisinopril (PRINIVIL,ZESTRIL) 5 MG tablet Take 1 tablet (5 mg total) by mouth daily. 08/15/15  Yes Rexene Alberts, MD  nitroGLYCERIN (NITROSTAT) 0.4 MG SL tablet Place 1 tablet (0.4 mg total) under the tongue every 5 (five) minutes as needed for chest pain. 11/18/14  Yes Samuella Cota, MD  omeprazole (PRILOSEC) 20 MG capsule Take 1 capsule (20 mg total) by mouth 2 (two) times daily before a meal. 06/17/13  Yes Lezlie Octave Black, NP  ondansetron (ZOFRAN) 4 MG tablet Take 1 tablet (4 mg total) by mouth every 8 (eight) hours as needed for nausea or vomiting. 08/15/15  Yes Rexene Alberts, MD  tamsulosin (FLOMAX) 0.4 MG CAPS capsule Take 1 capsule (0.4 mg total) by mouth daily. 09/02/15  Yes Sid Falcon, MD   BP 148/78 mmHg  Pulse 85  Resp 18  SpO2 99% Physical Exam  Constitutional: He is oriented to person, place, and time. He appears well-developed and well-nourished. No distress.  HENT:  Head: Normocephalic and atraumatic.  Mouth/Throat: Oropharynx is clear and moist. No oropharyngeal exudate.  Eyes: EOM are normal. Pupils are equal, round, and reactive to light.  Neck: Normal range of motion. Neck supple. No JVD present.  Cardiovascular: Normal rate  and regular rhythm.  Exam reveals no gallop and no friction rub.   No murmur heard. Pulmonary/Chest: Effort normal. No respiratory distress. He has no wheezes. He has no rales. He exhibits tenderness (Chest pain is reproduced with palpation over the L chest. No crepitance or deformity. ).  Diminished bl bases  Abdominal: Soft. Bowel sounds are normal. He exhibits no distension and no mass. There is no tenderness. There is no rebound and no guarding.  Musculoskeletal: Normal range of motion. He exhibits no edema or tenderness.  Bl BKA. No swelling or tenderness. HD access in LUE. No swelling or erythema  Neurological: He is alert and oriented to person, place, and time.  Moves all ext without deficit. Sensation is grossly intact  Skin: Skin is warm and dry. No rash noted. No erythema.  Psychiatric: He has a normal mood and affect. His  behavior is normal.  Nursing note and vitals reviewed.   ED Course  Procedures (including critical care time) Labs Review Labs Reviewed  BASIC METABOLIC PANEL - Abnormal; Notable for the following:    Potassium 3.2 (*)    Chloride 100 (*)    Glucose, Bld 174 (*)    Creatinine, Ser 2.56 (*)    Calcium 7.4 (*)    GFR calc non Af Amer 23 (*)    GFR calc Af Amer 26 (*)    Anion gap 3 (*)    All other components within normal limits  CBC - Abnormal; Notable for the following:    RBC 3.37 (*)    Hemoglobin 10.3 (*)    HCT 32.1 (*)    All other components within normal limits  I-STAT TROPOININ, ED  I-STAT TROPOININ, ED    Imaging Review Dg Chest Portable 1 View  09/04/2015  CLINICAL DATA:  Chest pain and hypertension EXAM: PORTABLE CHEST 1 VIEW COMPARISON:  August 27, 2015 FINDINGS: There is stable scarring in the left lower lobe region. The lungs elsewhere are clear. Heart is mildly enlarged with pulmonary vascularity within normal limits. No adenopathy. Patient is status post coronary artery bypass grafting. There is extensive arthropathy in both  shoulders. There is superior migration of each humeral head. IMPRESSION: Scarring left lower lobe. No edema or consolidation. Stable cardiac prominence. Arthropathy in each shoulder with chronic rotator cuff tears bilaterally. Electronically Signed   By: Lowella Grip III M.D.   On: 09/04/2015 15:51   I have personally reviewed and evaluated these images and lab results as part of my medical decision-making.   EKG Interpretation   Date/Time:  Monday September 04 2015 15:18:04 EDT Ventricular Rate:  91 PR Interval:    QRS Duration: 105 QT Interval:  378 QTC Calculation: 463 R Axis:   22 Text Interpretation:  Sinus rhythm Atrial premature complex Low voltage,  precordial leads Confirmed by Jentry Mcqueary  MD, Nour Scalise ([id number]) on 09/04/2015  3:20:02 PM      MDM   Final diagnoses:  Atypical chest pain   Repeat troponin is normal. Pain is reproduced with palpation of the left chest. Low suspicion that this is cardiac in nature. Appears to be musculoskeletal. He does have risk factors for coronary artery disease and should follow-up with cardiology. Return precautions given.     Julianne Rice, MD 09/04/15 2116

## 2015-09-04 NOTE — ED Notes (Signed)
Pt complain of chest pain that started about thirty minutes ago. Pt stats she was almost finished with dialysis when the pain started

## 2015-09-04 NOTE — ED Notes (Signed)
Wife called to pick up patient, she states she and her son Kaizen arrive soon to take patient home.

## 2015-09-04 NOTE — Discharge Instructions (Signed)

## 2015-09-05 ENCOUNTER — Encounter (HOSPITAL_COMMUNITY): Payer: Self-pay

## 2015-09-05 ENCOUNTER — Ambulatory Visit (HOSPITAL_COMMUNITY): Payer: Medicare Other | Attending: Emergency Medicine

## 2015-09-05 DIAGNOSIS — R531 Weakness: Secondary | ICD-10-CM | POA: Insufficient documentation

## 2015-09-05 NOTE — Therapy (Signed)
[place], [place], [address] Phone: [phone]   Fax:  [phone]  Occupational Therapy Wheelchair Evaluation  Patient Details  Name: Alan [name] MRN: [id number] Date of Birth: 1940/05/28 Referring Provider: Vidal Schwalbe, MD  Encounter Date: 09/05/2015      OT End of Session - 09/05/15 1059    Visit Number 1   Number of Visits 1   Authorization Type [employer] medicare   Authorization Time Period before 10th visit   Authorization - Visit Number 1   Authorization - Number of Visits 10   OT Start Time 0945   OT Stop Time 1020   OT Time Calculation (min) 35 min   Activity Tolerance Patient tolerated treatment well   Behavior During Therapy [facility] for tasks assessed/performed      Past Medical History  Diagnosis Date  . Diabetes mellitus   . Hypertension   . Gout   . Coronary artery disease 2000    Stents [facility]  . Stroke ([place])   . Asthma   . Hypercholesteremia   . Arthritis   . DDD (degenerative disc disease), lumbar   . DDD (degenerative disc disease), cervical   . Gangrene of toe ([place]) Feb. 2015    Right  great    . Gastric ulcer     EGD 2014  . 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 Clotted renal dialysis arteriovenous graft ([place])   . CVA (cerebral infarction)   . ESRD (end stage renal disease) on dialysis ([place])   . 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    Past Surgical History  Procedure Laterality Date  . Coronary artery bypass graft  1996    [facility]  . Below knee leg amputation Bilateral   . Esophagogastroduodenoscopy Left 05/24/2012    [id number] dilated baggy but otherwise a normal/ Small hiatal hernia. Gastric ulcer -S/P biopsy  . Amputation Right 04/10/2013    Procedure: Right Below Knee Amputation;  Surgeon: Newt Minion, MD;  Location: [facility];  Service: Orthopedics;  Laterality: Right;  Right Below Knee Amputation  . Appendectomy    . Peripheral vascular catheterization  N/A 08/09/2014    Procedure: A/V Shuntogram/Fistulagram;  Surgeon: Katha Cabal, MD;  Location: [facility];  Service: Cardiovascular;  Laterality: N/A;  . Peripheral vascular catheterization Left 08/09/2014    Procedure: A/V Shunt Intervention;  Surgeon: Katha Cabal, MD;  Location: [facility];  Service: Cardiovascular;  Laterality: Left;  . Cholecystectomy      There were no vitals filed for this visit.         [facility] OT Assessment - 09/05/15 1059    Assessment   Diagnosis Wheelchair evaluation   Referring Provider Vidal Schwalbe, MD         Date: 09/05/15 Patient Name: Alan [name] Address: [address] [place], [place] [id number] DOB: 2041/01/11   To Whom It May Concern,    Alan [name] was seen today in this clinic for a power wheelchair evaluation. 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   Since [name] first amputation in 2006 he has been wheelchair bound using a manual wheelchair primarily. Alan [name] reports that within the past year he has suddenly lost active ROM and strength in bilateral shoulders and is unable to self-propel his wheelchair. Alan [name] is completely dependent for wheelchair mobility.   Alan [name] lives with his wife in a 1 story home with a ramped entrance. Alan [name] receives Total assist with bathing and dressing at bed level since the recent decline in bilateral shoulder function. Alan [name] completes sponge bathes as his bathroom is not handicap  accessible. Alan [name] using a bedside commode for bowel and bladder use. Alan [name] wife is his primary caregiver. She reports that he has had a more difficult time with bed mobility and transfers.     A FULL PHYSICAL ASSESSMENT REVEALS THE FOLLOWING    Existing Equipment:   Alan [name] owns a manual wheelchair, BSC, slideboard, and hoyer lift.   Transfers:  Alan [name] is a total assist for transfers using the hoyer lift at home and at dialysis.  Head and Neck:  AROM WFL   Trunk and Pelvis: AROM is limited.    Hip: AROM WFL. Left: 3/5 Right: 3/5  Knees:  Right AROM WFL. Decreased AROM in left knee during extension.  Feet and Ankles: N/A  Upper Extremities: Alan [name] is right handed. No AROM in bilateral shoulders. PROM is [facility]. AROM of bilateral elbows and wrist is [facility] although right wrist does not present with full ROM. Right shoulder MMT: 1/5 (flexion, horizontal abduction, external and internal rotation). Left shoulder MMT: 1/5 (flexion, horizontal abduction, external and internal rotation). Impaired gross grasp: right hand. Chronic left hand deformities (ongoing for ~2 years). No extension in 4th and 5th digits. No flexion in 2nd and 3rd digits. Functional gross grasp in right hand. No gross grasp in left hand. Patient is unable to form a complete fist with left hand.   Weight Shifting Ability:  Unable to complete weight shifting that is adequate to prevent skin breakdown.  Skin Integrity:  [name] does not have a chair cushion in his manual wheelchair. Alan [name] was seen by the Dr. Terance Hart for a decubitus ulcer within gluteal crease over coccyx, stage 1 which occurred after recent hospitalization (04/12/15-05/13/15).  Cognition:  WFL  Activity Tolerance: Poor. Mr. Ivanova is wheelchair bound and non-ambulatory. [name] is unable to actively raise his shoulders and is only able to complete tasks at waist level.   GOALS/OBJECTIVE OF SEATING INTERVENTION:  Recommendation: [name] would benefit from a new power wheelchair for use in his home and in the community. He is unable to propel a standard wheelchair due to decreased BUE strength and shoulder AROM and activity tolerance. A scooter would not be beneficial for Mr Plymire. He does not have the adequate space needed in his home for the wide turning radius required by the scooter.  Mr. Yarnell is motivated and willing to use his power wheelchair and is physically and mentally able to operate a power wheelchair. A power wheelchair would increase Mr. Riffe's safety and independence with daily activities and his quality of life.    If you require any further information concerning Mr. Motely's positioning, independence or mobility needs; or any further information why a lesser device Sebastin not work, please do not hesitate to contact me at [place], [place]. [address] [place], [place] [address] [phone].        Plan -  Sep 13, 2015 1100    Rehab Potential Excellent   OT Frequency One time visit   OT Treatment/Interventions Patient/family education   Consulted and Agree with Plan of Care Patient;Family member/caregiver   Family Member Consulted Wife      Patient [place] benefit from skilled therapeutic intervention in order to improve the following deficits and impairments:     Visit Diagnosis: Weakness generalized      G-Codes - 2015-09-13 1101    Functional Assessment Tool Used Clinical judgement   Functional Limitation Self care   Self Care Current Status [phone]) At least 80 percent but [name] than 100 percent impaired, limited or restricted   Self Care Goal Status [id number]) At least 80 percent but [name] than 100 percent impaired, limited or restricted   Self Care Discharge Status [phone]) At least 80 percent but [name] than 100 percent impaired, limited or restricted      Problem List Patient Active Problem List   Diagnosis Date Noted  . Septic shock ([place]) 08/28/2015  . Subclinical  hyperthyroidism 08/15/2015  . UTI (lower urinary tract infection)   . Essential hypertension 08/13/2015  . Nausea and vomiting 08/13/2015  . Pyelonephritis 08/12/2015  . ESRD (end stage renal disease) ([place]) 04/12/2015  . Gastroenteritis 04/12/2015  . S/P bilateral BKA (below knee amputation) ([place]) 04/12/2015  . HLD (hyperlipidemia) 04/12/2015  . Acute chest pain 11/16/2014  . Atherosclerosis of native arteries of the extremities with gangrene ([place]) 04/20/2013  . Sepsis ([place]) 04/13/2013  . Gram-negative bacteremia ([place]) 04/12/2013  . ESRD on dialysis ([place]) 04/09/2013  . Diabetic osteomyelitis ([place]) 04/09/2013  . Protein-calorie malnutrition, severe ([place]) 04/09/2013  . Gangrene of toe ([place]) 04/08/2013  . Toe gangrene ([place]) 04/08/2013  . Nausea with vomiting 05/22/2012  . S/P BKA (below knee amputation) unilateral, left 05/20/2012  . Musculoskeletal chest pain 05/20/2012  . Acute renal failure ([place]) 05/19/2012  . CAD (coronary artery disease) 05/19/2012  . DM type 2 causing vascular disease ([place]) 05/19/2012  . Hyperkalemia 05/19/2012    Ailene Ravel, OTR/L,CBIS  [id number]  09/13/2015, 11:02 AM  [facility] [address] [place], [place], [address] Phone: [phone]   Fax:  [phone]  Name: Angela Andaya MRN: [id number] Date of Birth: 12-Jan-1941

## 2015-12-14 ENCOUNTER — Encounter (INDEPENDENT_AMBULATORY_CARE_PROVIDER_SITE_OTHER): Payer: Self-pay | Admitting: Vascular Surgery

## 2015-12-14 ENCOUNTER — Other Ambulatory Visit (INDEPENDENT_AMBULATORY_CARE_PROVIDER_SITE_OTHER): Payer: Self-pay | Admitting: Vascular Surgery

## 2015-12-14 ENCOUNTER — Other Ambulatory Visit (INDEPENDENT_AMBULATORY_CARE_PROVIDER_SITE_OTHER): Payer: Medicare Other

## 2015-12-14 ENCOUNTER — Ambulatory Visit (INDEPENDENT_AMBULATORY_CARE_PROVIDER_SITE_OTHER): Payer: Medicare Other | Admitting: Vascular Surgery

## 2015-12-14 VITALS — BP 151/71 | HR 67 | Resp 16

## 2015-12-14 DIAGNOSIS — E785 Hyperlipidemia, unspecified: Secondary | ICD-10-CM

## 2015-12-14 DIAGNOSIS — E1159 Type 2 diabetes mellitus with other circulatory complications: Secondary | ICD-10-CM | POA: Diagnosis not present

## 2015-12-14 DIAGNOSIS — T829XXA Unspecified complication of cardiac and vascular prosthetic device, implant and graft, initial encounter: Secondary | ICD-10-CM

## 2015-12-14 DIAGNOSIS — Z992 Dependence on renal dialysis: Secondary | ICD-10-CM

## 2015-12-14 DIAGNOSIS — N186 End stage renal disease: Secondary | ICD-10-CM

## 2015-12-14 DIAGNOSIS — T829XXS Unspecified complication of cardiac and vascular prosthetic device, implant and graft, sequela: Secondary | ICD-10-CM

## 2015-12-14 NOTE — Progress Notes (Signed)
Subjective:    Patient ID: Alan [name], male    DOB: [birth year], 75 y.o.   MRN: [id number] Chief Complaint  Patient presents with  . Follow-up   Patient presents for a six month hemodialysis access follow up. The patient has a radiocephalic AV fistula. The patient underwent a duplex ultrasound of the AV access which was notable for a patent fistula with a significant stenosis mid-fistula. The patient denies any issues with hemodialysis such as increased bleeding, decrease in doppler flow or recirculation. He does have some cannulation issues with the proximal needle at times. The patient denies any fistula skin breakdown, pain, edema, pallor or ulceration of the arm / hand.     Review of Systems  Constitutional: Negative.   HENT: Negative.   Eyes: Negative.   Respiratory: Negative.   Cardiovascular: Negative.   Gastrointestinal: Negative.   Endocrine: Negative.   Genitourinary:       ESRD  Musculoskeletal:       Bilateral Lower Extremity Amputation  Skin: Negative.   Allergic/Immunologic: Negative.   Neurological: Negative.   Hematological: Negative.   Psychiatric/Behavioral: Negative.       Objective:   Physical Exam  Constitutional: He is oriented to person, place, and time. He appears well-developed and well-nourished.  HENT:  Head: Atraumatic.  Eyes: Conjunctivae and EOM are normal. Pupils are equal, round, and reactive to light.  Neck: Normal range of motion.  Cardiovascular: Normal rate, regular rhythm, normal heart sounds and intact distal pulses.   Pulses:      Radial pulses are 2+ on the right side, and 2+ on the left side.       Popliteal pulses are 1+ on the right side, and 1+ on the left side.  Left RadioCephalic AV Fistula: Pulsatile in nature. Thrill and bruit noted.   Musculoskeletal:  Bilateral BKA's. Healthy stumps.   Neurological: He is alert and oriented to person, place, and time.  Skin: Skin is warm and dry.  Psychiatric: He has a normal mood and  affect. His behavior is normal. Judgment and thought content normal.   BP (!) 151/71   Pulse 67   Resp 16   Past Medical History:  Diagnosis Date  . Arthritis   . Asthma   . Clotted renal dialysis arteriovenous graft ([place])   . Coronary artery disease 2000   Stents [facility]  . CVA (cerebral infarction)   . DDD (degenerative disc disease), cervical   . DDD (degenerative disc disease), lumbar   . Diabetes mellitus   . ESRD (end stage renal disease) on dialysis ([place])   . Gangrene of toe ([place]) Feb. 2015   Right  great    . Gastric ulcer    EGD 2014  . Gout   . Hypercholesteremia   . Hypertension   . Stroke ([place])   . 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 Thrombosis of renal dialysis arteriovenous graft [facility])    Social History   Social History  . Marital status: Married    Spouse name: N/A  . Number of children: N/A  . Years of education: N/A   Occupational History  . Not on file.   Social History Main Topics  . Smoking status: Former Smoker    Quit date: 08/08/2004  . Smokeless tobacco: Never Used  . Alcohol use No  . Drug use: No  . Sexual activity: Not Currently   Other Topics Concern  . Not on file   Social History Narrative  . No narrative on  file   Past Surgical History:  Procedure Laterality Date  . AMPUTATION Right 04/10/2013   Procedure: Right Below Knee Amputation;  Surgeon: Newt Minion, MD;  Location: [place];  Service: Orthopedics;  Laterality: Right;  Right Below Knee Amputation  . APPENDECTOMY    . BELOW KNEE LEG AMPUTATION Bilateral   . CHOLECYSTECTOMY    . CORONARY ARTERY BYPASS GRAFT  1996   [place]  . ESOPHAGOGASTRODUODENOSCOPY Left 05/24/2012   NTI:[id number] dilated baggy but otherwise a normal/ Small hiatal hernia. Gastric ulcer -S/P biopsy  . PERIPHERAL VASCULAR CATHETERIZATION N/A 08/09/2014   Procedure: A/V Shuntogram/Fistulagram;  Surgeon: Katha Cabal, MD;  Location: [place] CV [facility];  Service: Cardiovascular;  Laterality: N/A;  .  PERIPHERAL VASCULAR CATHETERIZATION Left 08/09/2014   Procedure: A/V Shunt Intervention;  Surgeon: Katha Cabal, MD;  Location: [place] [facility];  Service: Cardiovascular;  Laterality: Left;   Family History  Problem Relation Age of Onset  . Colon cancer Neg Hx   . Cancer Mother     Allergies  Allergen Reactions  . Codeine Itching and Nausea And Vomiting  . Yellow Jacket Venom [Bee Venom] Swelling      Assessment & Plan:  Patient presents for a six month hemodialysis access follow up. The patient has a radiocephalic AV fistula. The patient underwent a duplex ultrasound of the AV access which was notable for a patent fistula with a significant stenosis mid-fistula. The patient denies any issues with hemodialysis such as increased bleeding, decrease in doppler flow or recirculation. He does have some cannulation issues with the proximal needle at times. The patient denies any fistula skin breakdown, pain, edema, pallor or ulceration of the arm / hand.    1. ESRD on dialysis ([place]) - Stable Patient Arick need fistulogram to correct area of stenosis seen on duplex today.   2. Complication from renal dialysis device, initial encounter - New Recommended fistulogram to correct area of stenosis on duplex today. Patient states he is unable to undergo the procedure until after christmas. We had a long discussing about the risks of waiting so long, (thrombosis of fistula, need for permath, etc). Patient expresses his understanding but still wants to wait.   3. DM type 2 causing vascular disease ([place]) - Stable Encouraged good control as its slows the progression of atherosclerotic disease  4. Hyperlipidemia, unspecified hyperlipidemia type - Stable On ASA for medical optimization. Encouraged good control as its slows the progression of atherosclerotic disease  Current Outpatient Prescriptions on File Prior to Visit  Medication Sig Dispense Refill  . aspirin EC 81 MG tablet Take 81 mg by  mouth at bedtime.     . B Complex-C-Folic Acid (NEPHRO-VITE PO) Take 1 tablet by mouth every evening.     . calcium acetate (PHOSLO) 667 MG capsule Take 1 capsule (667 mg total) by mouth 3 (three) times daily with meals. 90 capsule 3  . cinacalcet (SENSIPAR) 30 MG tablet Take 30 mg by mouth daily.    . Coenzyme Q10 (CO Q-10) 50 MG CAPS Take 1 capsule by mouth daily.    Marland Kitchen omeprazole (PRILOSEC) 20 MG capsule Take 1 capsule (20 mg total) by mouth 2 (two) times daily before a meal.    . tamsulosin (FLOMAX) 0.4 MG CAPS capsule Take 1 capsule (0.4 mg total) by mouth daily. 30 capsule 3  . acetaminophen (TYLENOL) 500 MG tablet Take 500 mg by mouth 2 (two) times daily.    . Amino Acid Infusion (PROSOL)  20 % SOLN every Monday, Wednesday, and Friday.     Marland Kitchen amLODipine (NORVASC) 5 MG tablet Take 1 tablet (5 mg total) by mouth daily. (Patient not taking: Reported on 12/14/2015) 30 tablet 3  . budesonide-formoterol (SYMBICORT) 160-4.5 MCG/ACT inhaler Inhale 2 puffs into the lungs 2 (two) times daily.    . ciprofloxacin (CIPRO) 500 MG tablet Take 1 tablet (500 mg total) by mouth daily. For 6 more days (Patient not taking: Reported on 12/14/2015) 6 tablet 0  . furosemide (LASIX) 40 MG tablet TAKE 1/2 TABLET BY MOUTH EVERY EVENING (Patient not taking: Reported on 12/14/2015) 30 tablet 3  . LANTUS SOLOSTAR 100 UNIT/ML Solostar Pen Inject 8 Units into the skin every evening.     Marland Kitchen lisinopril (PRINIVIL,ZESTRIL) 5 MG tablet Take 1 tablet (5 mg total) by mouth daily. (Patient not taking: Reported on 12/14/2015) 30 tablet 3  . nitroGLYCERIN (NITROSTAT) 0.4 MG SL tablet Place 1 tablet (0.4 mg total) under the tongue every 5 (five) minutes as needed for chest pain. (Patient not taking: Reported on 12/14/2015) 30 tablet 0  . ondansetron (ZOFRAN) 4 MG tablet Take 1 tablet (4 mg total) by mouth every 8 (eight) hours as needed for nausea or vomiting. (Patient not taking: Reported on 12/14/2015) 20 tablet 0   No current  facility-administered medications on file prior to visit.     There are no Patient Instructions on file for this visit. No Follow-up on file.   Jaishaun Mcnab A Hayat [place], PA-C

## 2016-01-16 ENCOUNTER — Ambulatory Visit: Payer: Medicare Other | Admitting: Urology

## 2016-02-14 ENCOUNTER — Encounter (HOSPITAL_COMMUNITY): Payer: Self-pay | Admitting: Emergency Medicine

## 2016-02-14 ENCOUNTER — Emergency Department (HOSPITAL_COMMUNITY)
Admission: EM | Admit: 2016-02-14 | Discharge: 2016-02-14 | Disposition: A | Discharge status: Home / Self Care | Payer: Medicare Other | Attending: Emergency Medicine | Admitting: Emergency Medicine

## 2016-02-14 ENCOUNTER — Emergency Department (HOSPITAL_COMMUNITY): Payer: Medicare Other

## 2016-02-14 DIAGNOSIS — Y939 Activity, unspecified: Secondary | ICD-10-CM | POA: Insufficient documentation

## 2016-02-14 DIAGNOSIS — S4992XA Unspecified injury of left shoulder and upper arm, initial encounter: Secondary | ICD-10-CM | POA: Diagnosis present

## 2016-02-14 DIAGNOSIS — S40012A Contusion of left shoulder, initial encounter: Secondary | ICD-10-CM | POA: Insufficient documentation

## 2016-02-14 DIAGNOSIS — Y999 Unspecified external cause status: Secondary | ICD-10-CM | POA: Insufficient documentation

## 2016-02-14 DIAGNOSIS — I120 Hypertensive chronic kidney disease with stage 5 chronic kidney disease or end stage renal disease: Secondary | ICD-10-CM | POA: Diagnosis not present

## 2016-02-14 DIAGNOSIS — Z7982 Long term (current) use of aspirin: Secondary | ICD-10-CM | POA: Diagnosis not present

## 2016-02-14 DIAGNOSIS — E1122 Type 2 diabetes mellitus with diabetic chronic kidney disease: Secondary | ICD-10-CM | POA: Diagnosis not present

## 2016-02-14 DIAGNOSIS — Z794 Long term (current) use of insulin: Secondary | ICD-10-CM | POA: Diagnosis not present

## 2016-02-14 DIAGNOSIS — J45909 Unspecified asthma, uncomplicated: Secondary | ICD-10-CM | POA: Diagnosis not present

## 2016-02-14 DIAGNOSIS — N186 End stage renal disease: Secondary | ICD-10-CM | POA: Insufficient documentation

## 2016-02-14 DIAGNOSIS — Z951 Presence of aortocoronary bypass graft: Secondary | ICD-10-CM | POA: Diagnosis not present

## 2016-02-14 DIAGNOSIS — Z79899 Other long term (current) drug therapy: Secondary | ICD-10-CM | POA: Insufficient documentation

## 2016-02-14 DIAGNOSIS — Z992 Dependence on renal dialysis: Secondary | ICD-10-CM | POA: Insufficient documentation

## 2016-02-14 DIAGNOSIS — Z87891 Personal history of nicotine dependence: Secondary | ICD-10-CM | POA: Insufficient documentation

## 2016-02-14 DIAGNOSIS — W050XXA Fall from non-moving wheelchair, initial encounter: Secondary | ICD-10-CM | POA: Insufficient documentation

## 2016-02-14 DIAGNOSIS — I2510 Atherosclerotic heart disease of native coronary artery without angina pectoris: Secondary | ICD-10-CM | POA: Insufficient documentation

## 2016-02-14 DIAGNOSIS — Y929 Unspecified place or not applicable: Secondary | ICD-10-CM | POA: Insufficient documentation

## 2016-02-14 LAB — URINALYSIS, ROUTINE W REFLEX MICROSCOPIC
Bilirubin Urine: NEGATIVE
Glucose, UA: NEGATIVE mg/dL
Nitrite: NEGATIVE
Protein, ur: 100 mg/dL — AB
Specific Gravity, Urine: 1.01 (ref 1.005–1.030)
pH: 7.5 (ref 5.0–8.0)

## 2016-02-14 LAB — URINALYSIS, MICROSCOPIC (REFLEX)

## 2016-02-14 MED ORDER — DIAZEPAM 5 MG PO TABS
5.0000 mg | ORAL_TABLET | Freq: Once | ORAL | Status: AC
  Administered 2016-02-14: 5 mg via ORAL
  Filled 2016-02-14: qty 1

## 2016-02-14 MED ORDER — CEPHALEXIN 500 MG PO CAPS: 500.0000 mg | ORAL_CAPSULE | Freq: Two times a day (BID) | ORAL | 0 refills | Status: DC

## 2016-02-14 MED ORDER — CEPHALEXIN 500 MG PO CAPS
500.0000 mg | ORAL_CAPSULE | Freq: Once | ORAL | Status: AC
  Administered 2016-02-14: 500 mg via ORAL
  Filled 2016-02-14: qty 1

## 2016-02-14 MED ORDER — OXYCODONE-ACETAMINOPHEN 5-325 MG PO TABS
1.0000 | ORAL_TABLET | Freq: Once | ORAL | Status: AC
  Administered 2016-02-14: 1 via ORAL
  Filled 2016-02-14: qty 1

## 2016-02-14 NOTE — ED Triage Notes (Signed)
Patient states that the wheel came off of his wheelchair and he fell on his left side. Complains of left arm and side pain. Sates that he had dialysis in his left arm and received dialysis yesterday after fall. Patient is a bilateral BKA patient.

## 2016-02-14 NOTE — ED Provider Notes (Signed)
[place] DEPT Provider Note   CSN: [id number] Arrival date & time: 02/14/16  1309     History   Chief Complaint Chief Complaint  Patient presents with  . Fall    HPI Alan [name] is a 75 y.o. male.  HPI   75 year old male with left shoulder and left chest pain. Patient had mechanical fall yesterday while at dialysis. Patient states that the wheel came off of his wheelchair and he fell on his left side. Complains of left arm and side pain. Sates that he had dialysis in his left arm and received dialysis yesterday after fall. He doesn't get his head. Denies any severe headache or neck pain or back pain.  Past Medical History:  Diagnosis Date  . Arthritis   . Asthma   . Clotted renal dialysis arteriovenous graft ([place])   . Coronary artery disease 2000   Stents [facility]  . CVA (cerebral infarction)   . DDD (degenerative disc disease), cervical   . DDD (degenerative disc disease), lumbar   . Diabetes mellitus   . ESRD (end stage renal disease) on dialysis ([place])   . Gangrene of toe ([place]) Feb. 2015   Right  great    . Gastric ulcer    EGD 2014  . Gout   . Hypercholesteremia   . Hypertension   . Stroke ([place])   . 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 Thrombosis of renal dialysis arteriovenous graft [facility])     Patient Active Problem List   Diagnosis Date Noted  . Septic shock ([place]) 08/28/2015  . 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 UTI (lower urinary tract infection)   . Essential hypertension 08/13/2015  . Nausea and vomiting 08/13/2015  . Pyelonephritis 08/12/2015  . ESRD (end stage renal disease) ([place]) 04/12/2015  . Gastroenteritis 04/12/2015  . S/P bilateral BKA (below knee amputation) ([place]) 04/12/2015  . HLD (hyperlipidemia) 04/12/2015  . Acute chest pain 11/16/2014  . Atherosclerosis of native arteries of the extremities with gangrene ([place]) 04/20/2013  . Sepsis ([place]) 04/13/2013  . Gram-negative bacteremia 04/12/2013  . ESRD on dialysis ([place])  04/09/2013  . 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 Protein-calorie malnutrition, severe ([place]) 04/09/2013  . Gangrene of toe ([place]) 04/08/2013  . Toe gangrene ([place]) 04/08/2013  . Nausea with vomiting 05/22/2012  . S/P BKA (below knee amputation) unilateral, left 05/20/2012  . Musculoskeletal chest pain 05/20/2012  . Acute renal failure ([place]) 05/19/2012  . CAD (coronary artery disease) 05/19/2012  . DM type 2 causing vascular disease ([place]) 05/19/2012  . Hyperkalemia 05/19/2012    Past Surgical History:  Procedure Laterality Date  . AMPUTATION Right 04/10/2013   Procedure: Right Below Knee Amputation;  Surgeon: Newt Minion, MD;  Location: [place];  Service: Orthopedics;  Laterality: Right;  Right Below Knee Amputation  . APPENDECTOMY    . BELOW KNEE LEG AMPUTATION Bilateral   . CHOLECYSTECTOMY    . CORONARY ARTERY BYPASS GRAFT  1996   [place]  . ESOPHAGOGASTRODUODENOSCOPY Left 05/24/2012   [id number] dilated baggy but otherwise a normal/ Small hiatal hernia. Gastric ulcer -S/P biopsy  . PERIPHERAL VASCULAR CATHETERIZATION N/A 08/09/2014   Procedure: A/V Shuntogram/Fistulagram;  Surgeon: Katha Cabal, MD;  Location: [place] [facility];  Service: Cardiovascular;  Laterality: N/A;  . PERIPHERAL VASCULAR CATHETERIZATION Left 08/09/2014   Procedure: A/V Shunt Intervention;  Surgeon: Katha Cabal, MD;  Location: [place] [facility];  Service: Cardiovascular;  Laterality: Left;       Home Medications    Prior  to Admission medications   Medication Sig Start Date End Date Taking? Authorizing Provider  acetaminophen (TYLENOL) 500 MG tablet Take 500 mg by mouth 2 (two) times daily.    Historical Provider, MD  Amino Acid Infusion (PROSOL) 20 % SOLN every Monday, Wednesday, and Friday.  10/27/14   Historical Provider, MD  amLODipine (NORVASC) 5 MG tablet Take 1 tablet (5 mg total) by mouth daily. Patient not taking: Reported on 12/14/2015 08/15/15   Rexene Alberts, MD  aspirin EC 81  MG tablet Take 81 mg by mouth at bedtime.     Historical Provider, MD  B Complex-C-Folic Acid (NEPHRO-VITE PO) Take 1 tablet by mouth every evening.     Historical Provider, MD  budesonide-formoterol (SYMBICORT) 160-4.5 MCG/ACT inhaler Inhale 2 puffs into the lungs 2 (two) times daily.    Historical Provider, MD  calcium acetate (PHOSLO) 667 MG capsule Take 1 capsule (667 mg total) by mouth 3 (three) times daily with meals. 08/15/15   Rexene Alberts, MD  cinacalcet (SENSIPAR) 30 MG tablet Take 30 mg by mouth daily.    Historical Provider, MD  Coenzyme Q10 (CO Q-10) 50 MG CAPS Take 1 capsule by mouth daily.    Historical Provider, MD  furosemide (LASIX) 40 MG tablet TAKE 1/2 TABLET BY MOUTH EVERY EVENING Patient not taking: Reported on 12/14/2015 05/22/15   Lendon Colonel, NP  LANTUS SOLOSTAR 100 UNIT/ML Solostar Pen Inject 8 Units into the skin every evening.  12/21/13   Historical Provider, MD  lisinopril (PRINIVIL,ZESTRIL) 5 MG tablet Take 1 tablet (5 mg total) by mouth daily. Patient not taking: Reported on 12/14/2015 08/15/15   Rexene Alberts, MD  nitroGLYCERIN (NITROSTAT) 0.4 MG SL tablet Place 1 tablet (0.4 mg total) under the tongue every 5 (five) minutes as needed for chest pain. Patient not taking: Reported on 12/14/2015 11/18/14   Samuella Cota, MD  omeprazole (PRILOSEC) 20 MG capsule Take 1 capsule (20 mg total) by mouth 2 (two) times daily before a meal. 06/17/13   Radene Gunning, NP  ondansetron (ZOFRAN) 4 MG tablet Take 1 tablet (4 mg total) by mouth every 8 (eight) hours as needed for nausea or vomiting. Patient not taking: Reported on 12/14/2015 08/15/15   Rexene Alberts, MD  tamsulosin (FLOMAX) 0.4 MG CAPS capsule Take 1 capsule (0.4 mg total) by mouth daily. 09/02/15   Sid Falcon, MD    Family History Family History  Problem Relation Age of Onset  . Cancer Mother   . Colon cancer Neg Hx     Social History Social History  Substance Use Topics  . Smoking status: Former  Smoker    Quit date: 08/08/2004  . Smokeless tobacco: Never Used  . Alcohol use No     Allergies   Codeine and Yellow jacket venom [bee venom]   Review of Systems Review of Systems  All systems reviewed and negative, other than as noted in HPI.   Physical Exam Updated Vital Signs BP (!) 128/54 (BP Location: Left Arm)   Pulse 76   Temp 98 F (36.7 C) (Oral)   Resp 17   Wt 156 lb (70.8 kg)   SpO2 100%   BMI 23.04 kg/m   Physical Exam  Constitutional: He appears well-developed and well-nourished. No distress.  HENT:  Head: Normocephalic and atraumatic.  Eyes: Conjunctivae are normal. Right eye exhibits no discharge. Left eye exhibits no discharge.  Neck: Neck supple.  Cardiovascular: Normal rate, regular rhythm and normal heart sounds.  Exam reveals no gallop and no friction rub.   No murmur heard. Pulmonary/Chest: Effort normal and breath sounds normal. No respiratory distress.  Abdominal: Soft. He exhibits no distension. There is no tenderness.  Musculoskeletal: He exhibits no edema.  Bilateral BKA's. Left shoulder grossly normal in appearance. There is mild tenderness anteriorly and laterally over the shoulder. Able to actively range. Neurovascularly intact. No midline spinal tenderness. Mild tenderness to chest wall.  Neurological: He is alert.  Skin: Skin is warm and dry.  Psychiatric: He has a normal mood and affect. His behavior is normal. Thought content normal.  Nursing note and vitals reviewed.    ED Treatments / Results  Labs (all labs ordered are listed, but only abnormal results are displayed) Labs Reviewed  URINE CULTURE - Abnormal; Notable for the following:       Result Value   Culture   (*)    Value: >=100,000 COLONIES/mL KLEBSIELLA PNEUMONIAE SUSCEPTIBILITIES TO FOLLOW Performed at [facility]    All other components within normal limits  URINALYSIS, ROUTINE W REFLEX MICROSCOPIC - Abnormal; Notable for the following:    APPearance  TURBID (*)    Hgb urine dipstick MODERATE (*)    Ketones, ur TRACE (*)    Protein, ur 100 (*)    Leukocytes, UA LARGE (*)    All other components within normal limits  URINALYSIS, MICROSCOPIC (REFLEX) - Abnormal; Notable for the following:    Bacteria, UA MANY (*)    Squamous Epithelial / LPF 0-5 (*)    All other components within normal limits    EKG  EKG Interpretation None       Radiology Dg Shoulder Left  Result Date: 02/14/2016 CLINICAL DATA:  LEFT shoulder pain, fell out of wheelchair at dialysis. EXAM: LEFT SHOULDER - 2+ VIEW COMPARISON:  None. FINDINGS: No acute fracture deformity or dislocation. Obliterated subacromial joint space with periarticular sclerosis. Severe glenohumeral joint space narrowing and marginal spurring. Mild humeral head marginal spurring. Osteopenia. No destructive bony lesions. Status post apparent CABG in included chest. IMPRESSION: No acute fracture deformity or dislocation. High-riding humeral head compatible with old rotator cuff injury, severe shoulder osteoarthrosis. Electronically Signed   By: Elon Alas M.D.   On: 02/14/2016 14:04    Procedures Procedures (including critical care time)  Medications Ordered in ED Medications  oxyCODONE-acetaminophen (PERCOCET/ROXICET) 5-325 MG per tablet 1 tablet (1 tablet Oral Given 02/14/16 1514)  diazepam (VALIUM) tablet 5 mg (5 mg Oral Given 02/14/16 1514)     Initial Impression / Assessment and Plan / ED Course  I have reviewed the triage vital signs and the nursing notes.  Pertinent labs & imaging results that were available during my care of the patient were reviewed by me and considered in my medical decision making (see chart for details).  Clinical Course     75 year old male with left chest wall and left shoulder pain after mechanical fall.negative imaging. Neurovascularly intact. Lungs are clear. No respiratory distress. Pleasant medical treatment of ontusion. Return precautions  discussed. Abx for UTI.   Final Clinical Impressions(s) / ED Diagnoses   Final diagnoses:  Contusion of left shoulder, initial encounter    New Prescriptions New Prescriptions   No medications on file     Virgel Manifold, MD 02/16/16 1308

## 2016-02-17 LAB — URINE CULTURE: Culture: >=100000 — AB

## 2016-02-18 ENCOUNTER — Telehealth: Payer: Self-pay

## 2016-02-18 NOTE — Telephone Encounter (Signed)
Post ED Visit - Positive Culture Follow-up  Culture report reviewed by antimicrobial stewardship pharmacist:  []  Elenor Quinones, Pharm.D. []  Heide Guile, Pharm.D., BCPS []  Parks Neptune, Pharm.D. []  Alycia Rossetti, Pharm.D., BCPS []  [place], Pharm.D., BCPS, AAHIVP []  Legrand Como, Pharm.D., BCPS, AAHIVP []  Milus Glazier, Pharm.D. []  Rob [place], Pharm.D. Sharilyn Sites Pharm D Positive urine culture Treated with Cephalexin, organism sensitive to the same and no further patient follow-up is required at this time.  Genia Del 02/18/2016, 9:49 AM

## 2016-03-20 ENCOUNTER — Other Ambulatory Visit: Payer: Self-pay | Admitting: Internal Medicine

## 2016-04-04 IMAGING — CT CT ABD-PELV W/O CM
2 of 4 series · 15 of 46 positions shown, 17 images · non-contrast
Comparison: Noncontrast CT Abdomen and Pelvis 05/19/2012

CLINICAL DATA: 74-year-old male presenting from dialysis with
hypotension. Initial encounter.

EXAM:
CT ABDOMEN AND PELVIS WITHOUT CONTRAST
TECHNIQUE: Multidetector CT imaging of the abdomen and pelvis was performed
following the standard protocol without IV contrast.

[Series 2: abdomen/pelvis w/o contrast · axial · non-contrast · 0.91mm/px · z∈[+269,+689]mm · 12 of 98 slices shown, 14 images]
[im 7/98  soft-tissue]
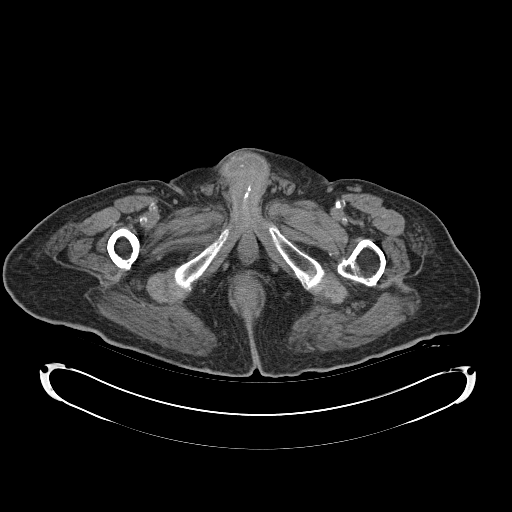
[im 7/98  bone]
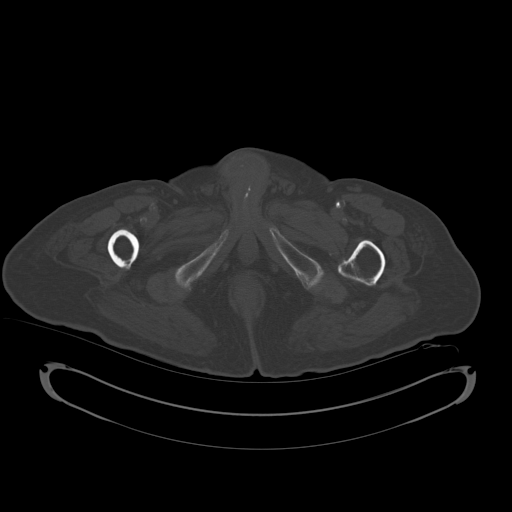
[im 13/98  soft-tissue]
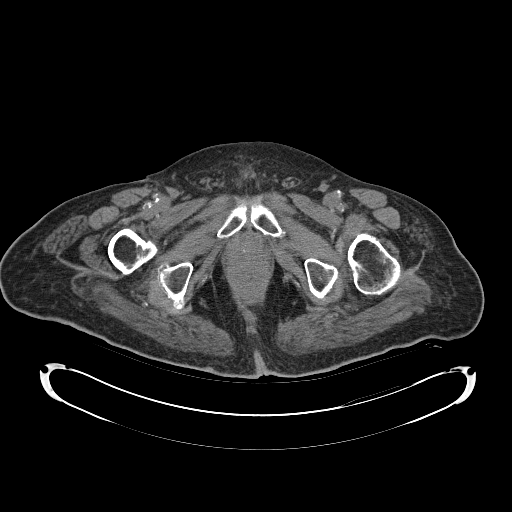
[im 20/98  soft-tissue]
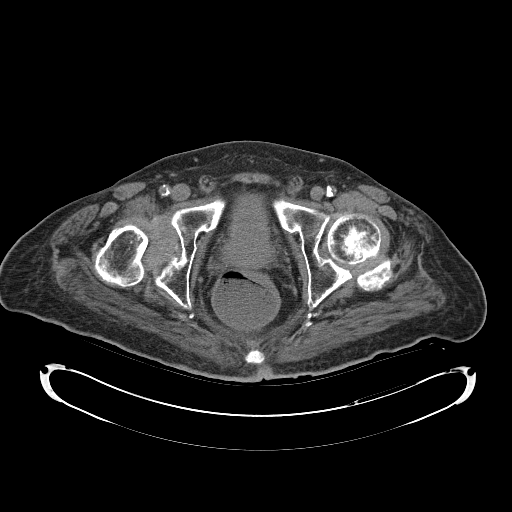
[im 33/98  soft-tissue]
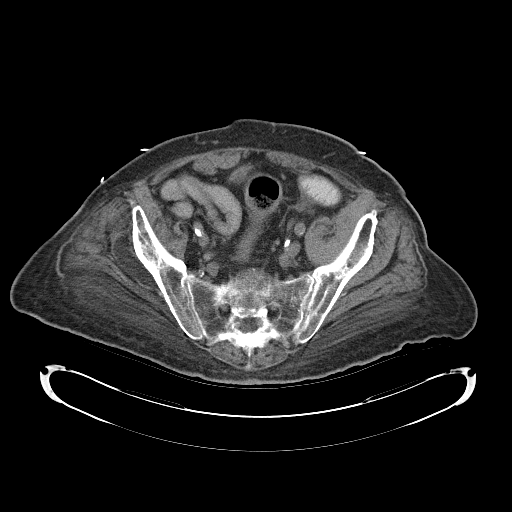
[im 39/98  soft-tissue]
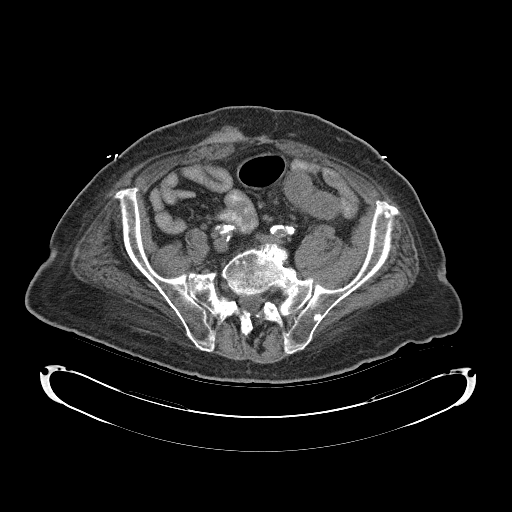
[im 46/98  soft-tissue]
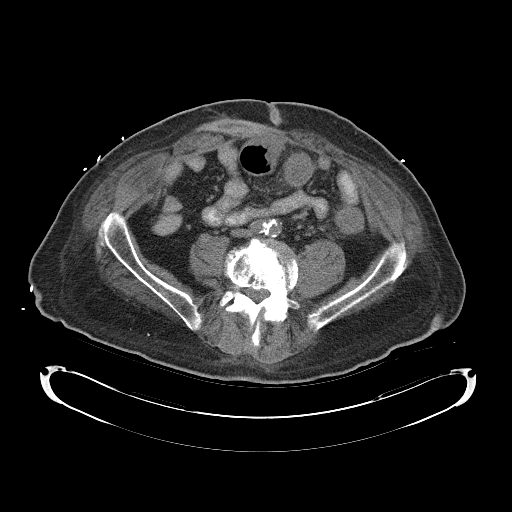
[im 52/98  soft-tissue]
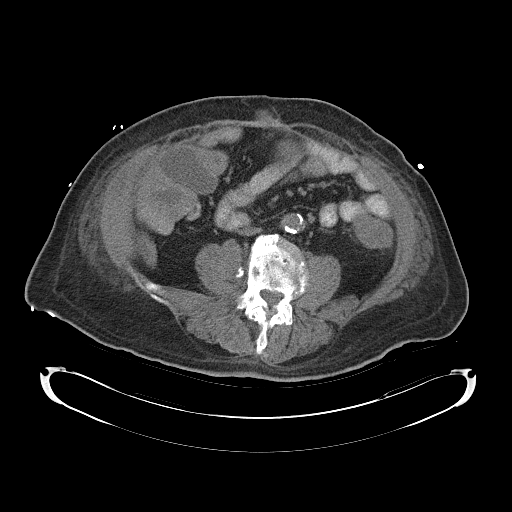
[im 59/98  soft-tissue]
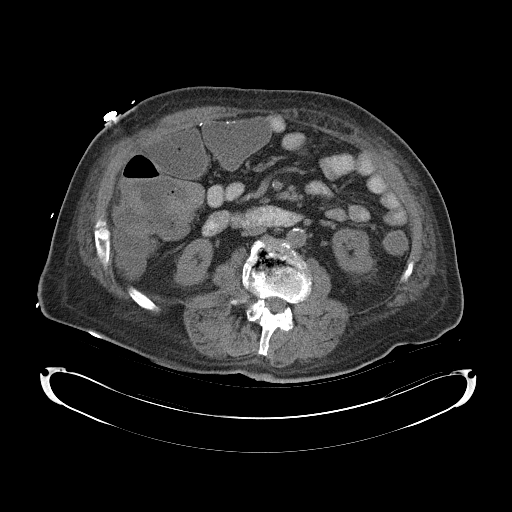
[im 65/98  soft-tissue]
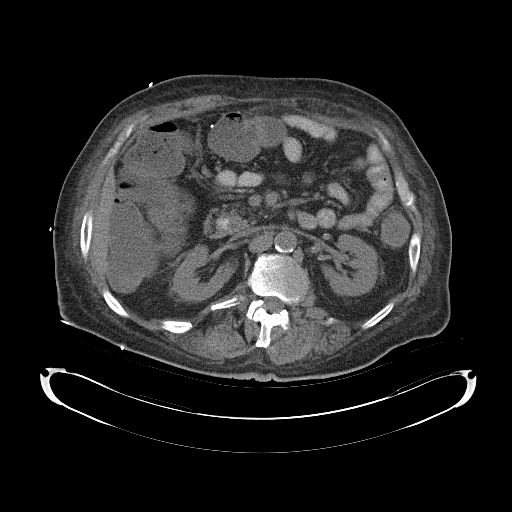
[im 65/98  bone]
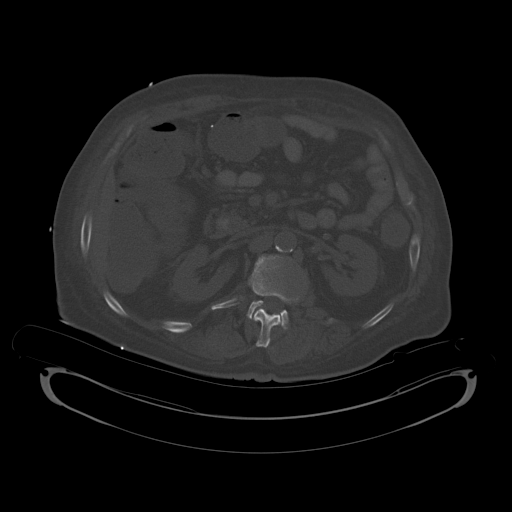
[im 78/98  soft-tissue]
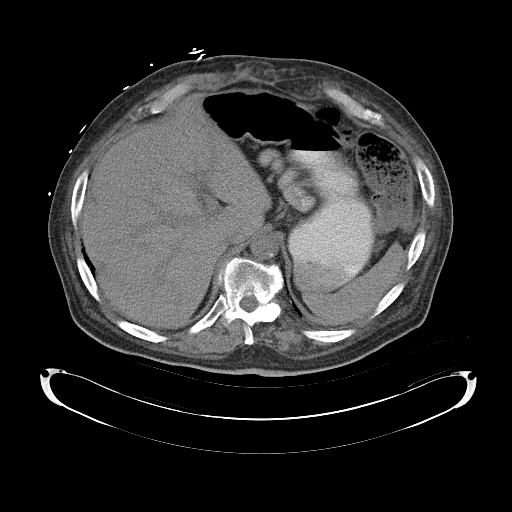
[im 85/98  soft-tissue]
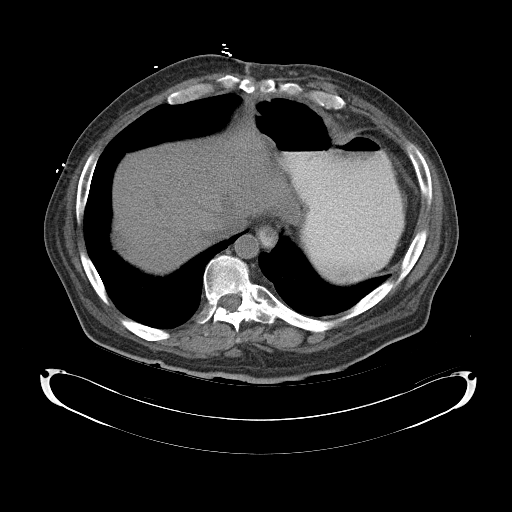
[im 91/98  soft-tissue]
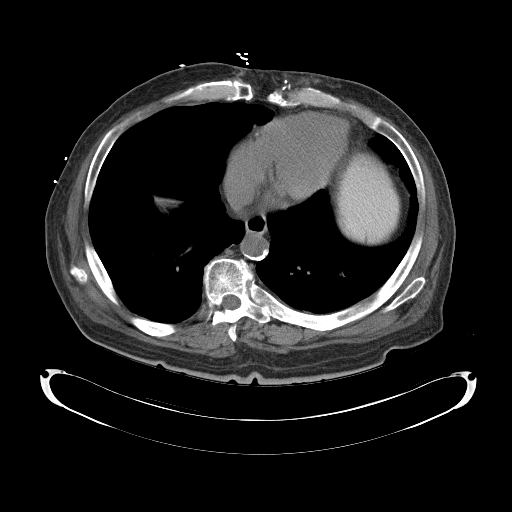

[Series 3: mpr cor 3.0mm · coronal · 0.82mm/px · 3 of 92 slices shown]
[im 31/92  soft-tissue]
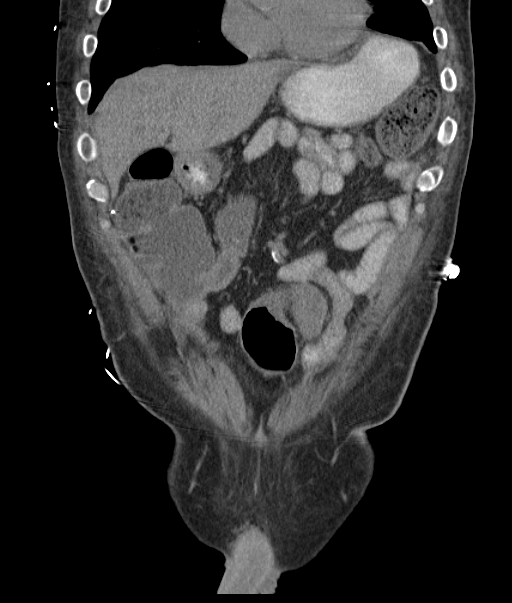
[im 41/92  soft-tissue]
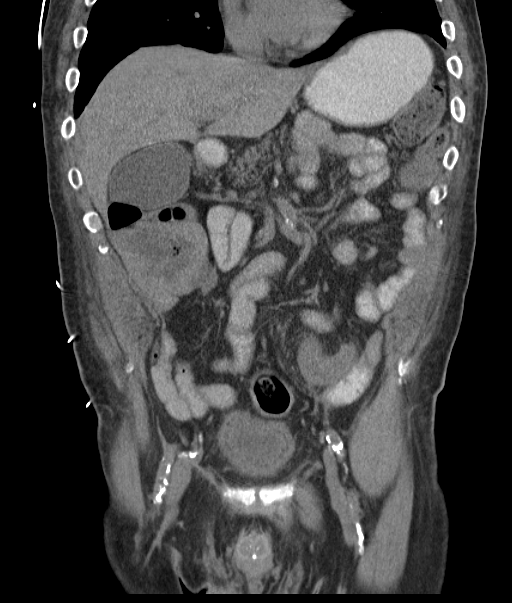
[im 51/92  soft-tissue]
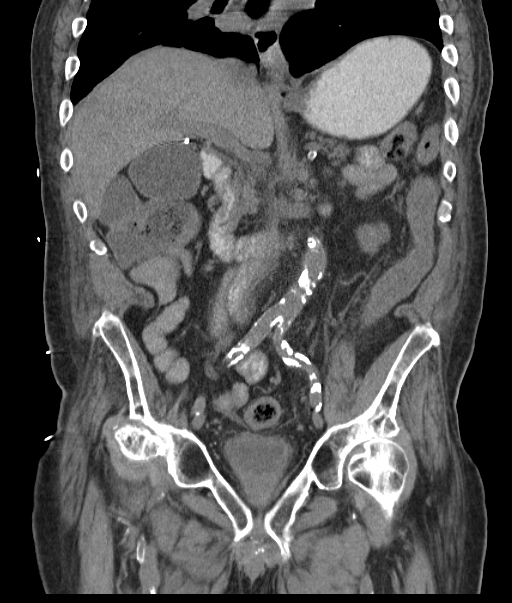

[15 of 46 positions shown; findings below may reference images not displayed]

FINDINGS: Scarring/atelectasis at the left lung base has not significantly
changed since 2222. No pericardial or pleural effusion. Minimal
scarring or atelectasis at the right lung base.

The thoracic esophagus is mildly distended with air in contrast.

Scoliosis and advanced degenerative changes in the spine. Chronic
erosions of the right proximal femur and acetabulum. There is some
associated joint effusion, but this has a chronic appearance and has
not significantly changed since 2222. Left hip joint degeneration.
No acute osseous abnormality identified.

Fluid in the rectum. Mild presacral stranding, nonspecific. No
pelvic free fluid. Diminutive urinary bladder.

Fluid as well as some gas and stool in the sigmoid colon. Fluid
throughout the left colon. Redundant splenic flexure is decompressed
in areas but otherwise contains stool. There is a mix of fluid in
stool in the transverse colon. The hepatic flexure is dilated with
fluid (up to 6 cm diameter). The ascending colon is mildly dilated
with fluid.

There is a small fat containing sub xiphoid ventral abdominal hernia
(series 2, image 29) but no bowel loops are affected. However, there
is a small (2-3 cm) immediately supraumbilical hernia which does
contain a portion of the transverse colon which contains fluid. See
series 2, image 46).

There is oral contrast throughout most of the small bowel which is
decompressed throughout. The stomach is distended with oral contrast
and gas. Negative duodenum.

No abdominal free air or free fluid. Surgically absent gallbladder.
Negative non contrast liver, spleen, pancreas (fatty infiltrated),
and adrenal glands. Negative noncontrast kidneys and ureters.
Aortoiliac calcified atherosclerosis noted. No lymphadenopathy.

There is a chronic partially calcified small fluid collection in the
lower rectus abdominal sheath (series 2, image 55) which is
unchanged.
IMPRESSION: 1. Dominant finding is fluid throughout much of the colon, which can
be seen with diarrhea or enema use. No free air or free fluid.
2. Associated intermittent dilatation of the colon, maximal at the
hepatic flexure and up to 6 cm diameter. Favor inflammatory ileus.
See # 3.

3. There is no small bowel dilatation to strongly indicate a
mechanical bowel obstruction. And while there are 2 small ventral
abdominal hernias, only that near the umbilicus (series 2, image [DATE] have a small incarcerated portion of the transverse colon but
this of itself should not be obstructing. Recommend physical exam
correlation to see if this small paraumbilical hernia is reducible.
4. No other acute process identified in the abdomen or pelvis.
Additional chronic findings detailed above.

## 2016-05-21 ENCOUNTER — Encounter (INDEPENDENT_AMBULATORY_CARE_PROVIDER_SITE_OTHER): Payer: Self-pay | Admitting: Vascular Surgery

## 2016-05-21 ENCOUNTER — Other Ambulatory Visit (INDEPENDENT_AMBULATORY_CARE_PROVIDER_SITE_OTHER): Payer: Self-pay | Admitting: Vascular Surgery

## 2016-05-21 ENCOUNTER — Ambulatory Visit (INDEPENDENT_AMBULATORY_CARE_PROVIDER_SITE_OTHER): Payer: Medicare Other

## 2016-05-21 ENCOUNTER — Encounter (INDEPENDENT_AMBULATORY_CARE_PROVIDER_SITE_OTHER): Payer: Self-pay

## 2016-05-21 ENCOUNTER — Ambulatory Visit (INDEPENDENT_AMBULATORY_CARE_PROVIDER_SITE_OTHER): Payer: Medicare Other | Admitting: Vascular Surgery

## 2016-05-21 VITALS — BP 149/68 | HR 65 | Resp 16 | Ht 69.0 in | Wt 168.0 lb

## 2016-05-21 DIAGNOSIS — Z992 Dependence on renal dialysis: Secondary | ICD-10-CM

## 2016-05-21 DIAGNOSIS — N186 End stage renal disease: Secondary | ICD-10-CM | POA: Diagnosis not present

## 2016-05-21 DIAGNOSIS — T829XXA Unspecified complication of cardiac and vascular prosthetic device, implant and graft, initial encounter: Secondary | ICD-10-CM

## 2016-05-21 DIAGNOSIS — T829XXD Unspecified complication of cardiac and vascular prosthetic device, implant and graft, subsequent encounter: Secondary | ICD-10-CM

## 2016-05-21 NOTE — Progress Notes (Signed)
Subjective:    Patient ID: Alan [name], male    DOB: 06-06-1940, 76 y.o.   MRN: [id number] Chief Complaint  Patient presents with  . Re-evaluation    Access ultrasound follow up   Patient presents at request of Dr. Berton Bon for "low transonic's and recirculation". The patient underwent a duplex ultrasound of the AV access which was notable for a patent fistula with elevated velocities at the distal and middle forearm. The patient denies any issues with hemodialysis such as cannulation problems, increased bleeding, decrease in doppler flow or recirculation. The patient also denies any fistula skin breakdown, pain, edema, pallor or ulceration of the arm / hand.    Review of Systems  All other systems reviewed and are negative.     Objective:   Physical Exam  Constitutional: He is oriented to person, place, and time. He appears well-nourished. No distress.  Bilateral lower extremity amputation.  HENT:  Head: Normocephalic and atraumatic.  Eyes: Conjunctivae are normal. Pupils are equal, round, and reactive to light.  Neck: Normal range of motion.  Cardiovascular: Normal rate, regular rhythm and normal heart sounds.   Pulses:      Radial pulses are 2+ on the right side, and 2+ on the left side.  Left Upper Extremity: Fistula skin intact. Pulsatile thrill and bruit.   Pulmonary/Chest: Effort normal.  Neurological: He is alert and oriented to person, place, and time.  Skin: Skin is warm and dry. He is not diaphoretic.  Psychiatric: He has a normal mood and affect. His behavior is normal. Judgment and thought content normal.  Vitals reviewed.  BP (!) 149/68 (BP Location: Right Arm)   Pulse 65   Resp 16   Ht 5\' 9"  (1.753 m)   Wt 168 lb (76.2 kg)   BMI 24.81 kg/m   Past Medical History:  Diagnosis Date  . Arthritis   . Asthma   . Clotted renal dialysis arteriovenous graft ([place])   . Coronary artery disease 2000   Stents [facility]  . CVA (cerebral infarction)   . DDD (degenerative  disc disease), cervical   . DDD (degenerative disc disease), lumbar   . Diabetes mellitus   . 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 Gangrene of toe ([place]) Feb. 2015   Right  great    . Gastric ulcer    EGD 2014  . Gout   . Hypercholesteremia   . Hypertension   . Stroke ([place])   . 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 Thrombosis of renal dialysis arteriovenous graft [facility])    Social History   Social History  . Marital status: Married    Spouse name: N/A  . Number of children: N/A  . Years of education: N/A   Occupational History  . Not on file.   Social History Main Topics  . Smoking status: Former Smoker    Quit date: 08/08/2004  . Smokeless tobacco: Never Used  . Alcohol use No  . Drug use: No  . Sexual activity: Not Currently   Other Topics Concern  . Not on file   Social History Narrative  . No narrative on file   Past Surgical History:  Procedure Laterality Date  . AMPUTATION Right 04/10/2013   Procedure: Right Below Knee Amputation;  Surgeon: Newt Minion, MD;  Location: [place];  Service: Orthopedics;  Laterality: Right;  Right Below Knee Amputation  . APPENDECTOMY    . BELOW KNEE LEG AMPUTATION Bilateral   . CHOLECYSTECTOMY    .  CORONARY ARTERY BYPASS GRAFT  1996   [place]  . ESOPHAGOGASTRODUODENOSCOPY Left 05/24/2012   BSW:HQPRFFMB dilated baggy but otherwise a normal/ Small hiatal hernia. Gastric ulcer -S/P biopsy  . PERIPHERAL VASCULAR CATHETERIZATION N/A 08/09/2014   Procedure: A/V Shuntogram/Fistulagram;  Surgeon: Katha Cabal, MD;  Location: [place] [facility];  Service: Cardiovascular;  Laterality: N/A;  . PERIPHERAL VASCULAR CATHETERIZATION Left 08/09/2014   Procedure: A/V Shunt Intervention;  Surgeon: Katha Cabal, MD;  Location: [place] [facility];  Service: Cardiovascular;  Laterality: Left;   Family History  Problem Relation Age of Onset  . Cancer Mother   . Colon cancer Neg Hx    Allergies  Allergen Reactions  .  Codeine Itching and Nausea And Vomiting  . [place] Venom [Bee Venom] Swelling      Assessment & Plan:  Patient presents at request of Dr. Berton Bon for "low transonic's and recirculation". The patient underwent a duplex ultrasound of the AV access which was notable for a patent fistula with elevated velocities at the distal and middle forearm. The patient denies any issues with hemodialysis such as cannulation problems, increased bleeding, decrease in doppler flow or recirculation. The patient also denies any fistula skin breakdown, pain, edema, pallor or ulceration of the arm / hand.   1. ESRD on dialysis ([place]) - Stable Patient without complaint. Stenosis noted on duplex.  Decrease in transonics or recirculation during dialysis. In an effort to keep access patent and functioning recommend fistulogram. Procedure, risks and benefits explained to patient. All questions answered. Patient wishes to proceed.   2. Complication from renal dialysis device, subsequent encounter - Stable As above.  Current Outpatient Prescriptions on File Prior to Visit  Medication Sig Dispense Refill  . acetaminophen (TYLENOL) 500 MG tablet Take 500 mg by mouth 2 (two) times daily.    . Amino Acid Infusion (PROSOL) 20 % SOLN every Monday, Wednesday, and Friday.     Marland Kitchen amLODipine (NORVASC) 5 MG tablet Take 1 tablet (5 mg total) by mouth daily. 30 tablet 3  . aspirin EC 81 MG tablet Take 81 mg by mouth at bedtime.     . B Complex-C-Folic Acid (NEPHRO-VITE PO) Take 1 tablet by mouth every evening.     . budesonide-formoterol (SYMBICORT) 160-4.5 MCG/ACT inhaler Inhale 2 puffs into the lungs 2 (two) times daily.    . calcium acetate (PHOSLO) 667 MG capsule Take 1 capsule (667 mg total) by mouth 3 (three) times daily with meals. 90 capsule 3  . cephALEXin (KEFLEX) 500 MG capsule Take 1 capsule (500 mg total) by mouth 2 (two) times daily. 10 capsule 0  . cinacalcet (SENSIPAR) 30 MG tablet Take 30 mg by mouth daily.     . Coenzyme Q10 (CO Q-10) 50 MG CAPS Take 1 capsule by mouth daily.    . furosemide (LASIX) 40 MG tablet TAKE 1/2 TABLET BY MOUTH EVERY EVENING 30 tablet 3  . LANTUS SOLOSTAR 100 UNIT/ML Solostar Pen Inject 8 Units into the skin every evening.     Marland Kitchen lisinopril (PRINIVIL,ZESTRIL) 5 MG tablet Take 1 tablet (5 mg total) by mouth daily. 30 tablet 3  . nitroGLYCERIN (NITROSTAT) 0.4 MG SL tablet Place 1 tablet (0.4 mg total) under the tongue every 5 (five) minutes as needed for chest pain. 30 tablet 0  . omeprazole (PRILOSEC) 20 MG capsule Take 1 capsule (20 mg total) by mouth 2 (two) times daily before a meal.    . ondansetron (ZOFRAN) 4 MG tablet Take  1 tablet (4 mg total) by mouth every 8 (eight) hours as needed for nausea or vomiting. 20 tablet 0  . tamsulosin (FLOMAX) 0.4 MG CAPS capsule Take 1 capsule (0.4 mg total) by mouth daily. 30 capsule 3   No current facility-administered medications on file prior to visit.    There are no Patient Instructions on file for this visit. No Follow-up on file.  Caitlyn Buchanan A Kihanna Kamiya, PA-C

## 2016-05-28 ENCOUNTER — Ambulatory Visit
Admission: RE | Admit: 2016-05-28 | Discharge: 2016-05-28 | Disposition: A | Discharge status: Home / Self Care | Payer: Medicare Other | Source: Ambulatory Visit | Attending: Vascular Surgery | Admitting: Vascular Surgery

## 2016-05-28 ENCOUNTER — Encounter
Admission: RE | Disposition: A | Discharge status: Home / Self Care | Payer: Self-pay | Source: Ambulatory Visit | Attending: Vascular Surgery

## 2016-05-28 DIAGNOSIS — Z91030 Bee allergy status: Secondary | ICD-10-CM | POA: Insufficient documentation

## 2016-05-28 DIAGNOSIS — T82858A Stenosis of vascular prosthetic devices, implants and grafts, initial encounter: Secondary | ICD-10-CM | POA: Insufficient documentation

## 2016-05-28 DIAGNOSIS — Z7982 Long term (current) use of aspirin: Secondary | ICD-10-CM | POA: Diagnosis not present

## 2016-05-28 DIAGNOSIS — N186 End stage renal disease: Secondary | ICD-10-CM | POA: Insufficient documentation

## 2016-05-28 DIAGNOSIS — Z809 Family history of malignant neoplasm, unspecified: Secondary | ICD-10-CM | POA: Insufficient documentation

## 2016-05-28 DIAGNOSIS — Z8673 Personal history of transient ischemic attack (TIA), and cerebral infarction without residual deficits: Secondary | ICD-10-CM | POA: Diagnosis not present

## 2016-05-28 DIAGNOSIS — M1990 Unspecified osteoarthritis, unspecified site: Secondary | ICD-10-CM | POA: Diagnosis not present

## 2016-05-28 DIAGNOSIS — I2510 Atherosclerotic heart disease of native coronary artery without angina pectoris: Secondary | ICD-10-CM | POA: Diagnosis not present

## 2016-05-28 DIAGNOSIS — Z885 Allergy status to narcotic agent status: Secondary | ICD-10-CM | POA: Diagnosis not present

## 2016-05-28 DIAGNOSIS — Z9889 Other specified postprocedural states: Secondary | ICD-10-CM | POA: Insufficient documentation

## 2016-05-28 DIAGNOSIS — Z951 Presence of aortocoronary bypass graft: Secondary | ICD-10-CM | POA: Insufficient documentation

## 2016-05-28 DIAGNOSIS — Z992 Dependence on renal dialysis: Secondary | ICD-10-CM | POA: Diagnosis not present

## 2016-05-28 DIAGNOSIS — Z89512 Acquired absence of left leg below knee: Secondary | ICD-10-CM | POA: Insufficient documentation

## 2016-05-28 DIAGNOSIS — E7800 Pure hypercholesterolemia, unspecified: Secondary | ICD-10-CM | POA: Insufficient documentation

## 2016-05-28 DIAGNOSIS — I120 Hypertensive chronic kidney disease with stage 5 chronic kidney disease or end stage renal disease: Secondary | ICD-10-CM | POA: Diagnosis not present

## 2016-05-28 DIAGNOSIS — Z9049 Acquired absence of other specified parts of digestive tract: Secondary | ICD-10-CM | POA: Insufficient documentation

## 2016-05-28 DIAGNOSIS — T82868A Thrombosis of vascular prosthetic devices, implants and grafts, initial encounter: Secondary | ICD-10-CM | POA: Diagnosis not present

## 2016-05-28 DIAGNOSIS — Z89511 Acquired absence of right leg below knee: Secondary | ICD-10-CM | POA: Diagnosis not present

## 2016-05-28 DIAGNOSIS — Y832 Surgical operation with anastomosis, bypass or graft as the cause of abnormal reaction of the patient, or of later complication, without mention of misadventure at the time of the procedure: Secondary | ICD-10-CM | POA: Diagnosis not present

## 2016-05-28 DIAGNOSIS — E1122 Type 2 diabetes mellitus with diabetic chronic kidney disease: Secondary | ICD-10-CM | POA: Insufficient documentation

## 2016-05-28 DIAGNOSIS — Z794 Long term (current) use of insulin: Secondary | ICD-10-CM | POA: Diagnosis not present

## 2016-05-28 DIAGNOSIS — E02 Subclinical iodine-deficiency hypothyroidism: Secondary | ICD-10-CM | POA: Diagnosis not present

## 2016-05-28 DIAGNOSIS — Z8711 Personal history of peptic ulcer disease: Secondary | ICD-10-CM | POA: Insufficient documentation

## 2016-05-28 HISTORY — PX: A/V SHUNT INTERVENTION: CATH118220

## 2016-05-28 HISTORY — PX: A/V FISTULAGRAM: CATH118298

## 2016-05-28 LAB — GLUCOSE, CAPILLARY: Glucose-Capillary: 107 mg/dL — ABNORMAL HIGH (ref 65–99)

## 2016-05-28 LAB — POTASSIUM (ARMC VASCULAR LAB ONLY): Potassium (ARMC vascular lab): 3.5 (ref 3.5–5.1)

## 2016-05-28 SURGERY — A/V FISTULAGRAM
Anesthesia: Moderate Sedation

## 2016-05-28 MED ORDER — LIDOCAINE HCL (PF) 1 % IJ SOLN
INTRAMUSCULAR | Status: AC
  Filled 2016-05-28: qty 30

## 2016-05-28 MED ORDER — SODIUM CHLORIDE 0.9 % IV SOLN
INTRAVENOUS | Status: DC | PRN
  Administered 2016-05-28: 200 mL via INTRAVENOUS

## 2016-05-28 MED ORDER — MIDAZOLAM HCL 5 MG/5ML IJ SOLN
INTRAMUSCULAR | Status: AC
  Filled 2016-05-28: qty 5

## 2016-05-28 MED ORDER — CEFAZOLIN SODIUM-DEXTROSE 2-4 GM/100ML-% IV SOLN
2.0000 g | Freq: Once | INTRAVENOUS | Status: AC
  Administered 2016-05-28 (×2): 1 g via INTRAVENOUS
  Filled 2016-05-28: qty 100

## 2016-05-28 MED ORDER — METHYLPREDNISOLONE SODIUM SUCC 125 MG IJ SOLR: 125.0000 mg | INTRAMUSCULAR | Status: DC | PRN

## 2016-05-28 MED ORDER — HEPARIN (PORCINE) IN NACL 2-0.9 UNIT/ML-% IJ SOLN
INTRAMUSCULAR | Status: AC
  Filled 2016-05-28: qty 1000

## 2016-05-28 MED ORDER — IOPAMIDOL (ISOVUE-300) INJECTION 61%
INTRAVENOUS | Status: DC | PRN
  Administered 2016-05-28: 25 mL via INTRA_ARTERIAL

## 2016-05-28 MED ORDER — FENTANYL CITRATE (PF) 100 MCG/2ML IJ SOLN
INTRAMUSCULAR | Status: DC | PRN
  Administered 2016-05-28: 50 ug via INTRAVENOUS

## 2016-05-28 MED ORDER — ONDANSETRON HCL 4 MG/2ML IJ SOLN: 4.0000 mg | Freq: Four times a day (QID) | INTRAMUSCULAR | Status: DC | PRN

## 2016-05-28 MED ORDER — SODIUM CHLORIDE 0.9 % IV SOLN
INTRAVENOUS | Status: DC
  Administered 2016-05-28: 10 mL/h via INTRAVENOUS

## 2016-05-28 MED ORDER — FAMOTIDINE 20 MG PO TABS: 40.0000 mg | ORAL_TABLET | ORAL | Status: DC | PRN

## 2016-05-28 MED ORDER — MIDAZOLAM HCL 2 MG/2ML IJ SOLN
INTRAMUSCULAR | Status: DC | PRN
  Administered 2016-05-28: 1 mg via INTRAVENOUS
  Administered 2016-05-28: 2 mg via INTRAVENOUS

## 2016-05-28 MED ORDER — FENTANYL CITRATE (PF) 100 MCG/2ML IJ SOLN
INTRAMUSCULAR | Status: AC
  Filled 2016-05-28: qty 2

## 2016-05-28 MED ORDER — HEPARIN SODIUM (PORCINE) 1000 UNIT/ML IJ SOLN
INTRAMUSCULAR | Status: AC
  Filled 2016-05-28: qty 1

## 2016-05-28 MED ORDER — HYDROMORPHONE HCL 1 MG/ML IJ SOLN: 1.0000 mg | Freq: Once | INTRAMUSCULAR | Status: DC | PRN

## 2016-05-28 MED ORDER — HEPARIN SODIUM (PORCINE) 1000 UNIT/ML IJ SOLN
INTRAMUSCULAR | Status: DC | PRN
  Administered 2016-05-28: 4000 [IU] via INTRAVENOUS

## 2016-05-28 SURGICAL SUPPLY — 17 items
BALLN LUTONIX DCB 5X60X130 (BALLOONS) ×4
BALLN LUTONIX DCB 6X60X130 (BALLOONS) ×4
BALLN ULTRVRSE 3X40X130C (BALLOONS) ×4
BALLN ULTRVRSE 4X60X75 (BALLOONS) ×4
BALLOON LUTONIX DCB 5X60X130 (BALLOONS) ×2 IMPLANT
BALLOON LUTONIX DCB 6X60X130 (BALLOONS) ×2 IMPLANT
BALLOON ULTRVRSE 3X40X130C (BALLOONS) ×2 IMPLANT
BALLOON ULTRVRSE 4X60X75 (BALLOONS) ×2 IMPLANT
CANNULA 5F STIFF (CANNULA) ×4 IMPLANT
CATH TORCON 5FR 0.38 (CATHETERS) ×4 IMPLANT
DEVICE PRESTO INFLATION (MISCELLANEOUS) ×4 IMPLANT
DRAPE BRACHIAL (DRAPES) ×4 IMPLANT
GUIDEWIRE ANGLED .035 180CM (WIRE) ×4 IMPLANT
PACK ANGIOGRAPHY (CUSTOM PROCEDURE TRAY) ×4 IMPLANT
SHEATH BRITE TIP 6FRX5.5 (SHEATH) ×4 IMPLANT
SUT MNCRL AB 4-0 PS2 18 (SUTURE) ×4 IMPLANT
WIRE MAGIC TORQUE 260C (WIRE) ×8 IMPLANT

## 2016-05-28 NOTE — H&P (Signed)
[facility] History & Physical Update  The patient was interviewed and re-examined.  The patient's previous History and Physical has been reviewed and is unchanged.  There is no change in the plan of care. We plan to proceed with the scheduled procedure.  Hortencia Pilar, MD  05/28/2016, 8:17 AM

## 2016-05-28 NOTE — Op Note (Signed)
OPERATIVE NOTE   PROCEDURE: 1. Contrast injection left radiocephalic  AV access 2. Percutaneous transluminal angioplasty left radiocephalic fistula at the level of the anastomosis using the Lutonix drug-eluting balloon  PRE-OPERATIVE DIAGNOSIS: Complication of dialysis access                                                       End Stage Renal Disease  POST-OPERATIVE DIAGNOSIS: same as above   SURGEON: Katha Cabal, M.D.  ANESTHESIA: Conscious sedation was administered under my direct supervision by the interventional radiology RN. IV Versed plus fentanyl were utilized. Continuous ECG, pulse oximetry and blood pressure was monitored throughout the entire procedure.  Conscious sedation was for a total of 30 minutes.  ESTIMATED BLOOD LOSS: minimal  FINDING(S): Stricture of the AV graft  SPECIMEN(S):  None  CONTRAST: 25 cc  FLUOROSCOPY TIME: 3.0 minutes  INDICATIONS: Alan [name] is a 76 y.o. male who  presents with malfunctioning left radiocephalic AV access.  The patient is scheduled for angiography with possible intervention of the AV access.  The patient is aware the risks include but are not limited to: bleeding, infection, thrombosis of the cannulated access, and possible anaphylactic reaction to the contrast.  The patient acknowledges if the access can not be salvaged a tunneled catheter Lawerance be needed and Braycen be placed during this procedure.  The patient is aware of the risks of the procedure and elects to proceed with the angiogram and intervention.  DESCRIPTION: After full informed written consent was obtained, the patient was brought back to the Special Procedure suite and placed supine position.  Appropriate cardiopulmonary monitors were placed.  The left arm was prepped and draped in the standard fashion.  Appropriate timeout is called. The left radiocephalic fistula  was cannulated with a micropuncture needle in a retrograde direction.  Cannulation was in the proximal  forearm. This choice was based on the results of the duplex ultrasound scan showing the stricture was near the arterial anastomosis.  Cannulation was performed with ultrasound guidance. Ultrasound was placed in a sterile sleeve, the AV access was interrogated and noted to be echolucent and compressible indicating patency. Image was recorded for the permanent record. The puncture is performed under continuous ultrasound visualization.   The microwire was advanced and the needle was exchanged for  a microsheath.  The J-wire was then advanced and a 6 Fr sheath inserted.  Hand injections were completed to image the access from the arterial anastomosis through the entire access.  The central venous structures were also imaged by hand injections.  Based on the images,  3000 units of heparin was given and a wire was negotiated through the strictures within the venous portion of the graft.  An 3 x 4 ultra versed balloon was used.  Inflation was to 20 atm for 1 minute.  Subsequently a 4 mm diameter balloon and then a 5 mm diameter balloon and finally a 6 mm diameter balloon were used to serially dilate the stricture. The 5 mm and 6 mm balloons were both Lutonix drug-eluting balloons.  Follow-up imaging demonstrates complete resolution of the stricture with rapid flow of contrast through the graft, the central venous anatomy is preserved.  A 4-0 Monocryl purse-string suture was sewn around the sheath.  The sheath was removed and light pressure was applied.  A  sterile bandage was applied to the puncture site.    COMPLICATIONS: None  CONDITION: Carlynn Purl, M.D [facility] Office: [phone]  05/28/2016 9:09 AM

## 2016-06-14 ENCOUNTER — Emergency Department (HOSPITAL_COMMUNITY): Payer: Medicare Other

## 2016-06-14 ENCOUNTER — Encounter (HOSPITAL_COMMUNITY): Payer: Self-pay | Admitting: *Deleted

## 2016-06-14 ENCOUNTER — Observation Stay (HOSPITAL_COMMUNITY)
Admission: EM | Admit: 2016-06-14 | Discharge: 2016-06-15 | Disposition: A | Discharge status: Home / Self Care | Payer: Medicare Other | Attending: Internal Medicine | Admitting: Internal Medicine

## 2016-06-14 DIAGNOSIS — I120 Hypertensive chronic kidney disease with stage 5 chronic kidney disease or end stage renal disease: Secondary | ICD-10-CM | POA: Diagnosis not present

## 2016-06-14 DIAGNOSIS — J45909 Unspecified asthma, uncomplicated: Secondary | ICD-10-CM | POA: Diagnosis not present

## 2016-06-14 DIAGNOSIS — Z79899 Other long term (current) drug therapy: Secondary | ICD-10-CM | POA: Diagnosis not present

## 2016-06-14 DIAGNOSIS — N390 Urinary tract infection, site not specified: Secondary | ICD-10-CM | POA: Diagnosis present

## 2016-06-14 DIAGNOSIS — N12 Tubulo-interstitial nephritis, not specified as acute or chronic: Secondary | ICD-10-CM | POA: Diagnosis not present

## 2016-06-14 DIAGNOSIS — Z992 Dependence on renal dialysis: Secondary | ICD-10-CM | POA: Diagnosis not present

## 2016-06-14 DIAGNOSIS — R109 Unspecified abdominal pain: Secondary | ICD-10-CM | POA: Diagnosis present

## 2016-06-14 DIAGNOSIS — N186 End stage renal disease: Secondary | ICD-10-CM | POA: Diagnosis not present

## 2016-06-14 DIAGNOSIS — R0789 Other chest pain: Secondary | ICD-10-CM | POA: Diagnosis not present

## 2016-06-14 DIAGNOSIS — Z87891 Personal history of nicotine dependence: Secondary | ICD-10-CM | POA: Insufficient documentation

## 2016-06-14 DIAGNOSIS — Z7982 Long term (current) use of aspirin: Secondary | ICD-10-CM | POA: Insufficient documentation

## 2016-06-14 DIAGNOSIS — I2510 Atherosclerotic heart disease of native coronary artery without angina pectoris: Secondary | ICD-10-CM | POA: Diagnosis not present

## 2016-06-14 DIAGNOSIS — E1159 Type 2 diabetes mellitus with other circulatory complications: Secondary | ICD-10-CM | POA: Diagnosis present

## 2016-06-14 DIAGNOSIS — E1122 Type 2 diabetes mellitus with diabetic chronic kidney disease: Secondary | ICD-10-CM | POA: Insufficient documentation

## 2016-06-14 DIAGNOSIS — R52 Pain, unspecified: Secondary | ICD-10-CM

## 2016-06-14 DIAGNOSIS — R079 Chest pain, unspecified: Secondary | ICD-10-CM | POA: Diagnosis present

## 2016-06-14 LAB — CBC WITH DIFFERENTIAL/PLATELET
BASOS ABS: 0 10*3/uL (ref 0.0–0.1)
Basophils Relative: 0 %
EOS PCT: 1 %
Eosinophils Absolute: 0.1 10*3/uL (ref 0.0–0.7)
HEMATOCRIT: 33.4 % — AB (ref 39.0–52.0)
Hemoglobin: 11.5 g/dL — ABNORMAL LOW (ref 13.0–17.0)
LYMPHS PCT: 28 %
Lymphs Abs: 2.7 10*3/uL (ref 0.7–4.0)
MCH: 31.6 pg (ref 26.0–34.0)
MCHC: 34.4 g/dL (ref 30.0–36.0)
MCV: 91.8 fL (ref 78.0–100.0)
Monocytes Absolute: 0.6 10*3/uL (ref 0.1–1.0)
Monocytes Relative: 6 %
NEUTROS ABS: 6.2 10*3/uL (ref 1.7–7.7)
Neutrophils Relative %: 65 %
PLATELETS: 189 10*3/uL (ref 150–400)
RBC: 3.64 MIL/uL — AB (ref 4.22–5.81)
RDW: 13 % (ref 11.5–15.5)
WBC: 9.7 10*3/uL (ref 4.0–10.5)

## 2016-06-14 LAB — BASIC METABOLIC PANEL
ANION GAP: 8 (ref 5–15)
BUN: 41 mg/dL — ABNORMAL HIGH (ref 6–20)
CO2: 30 mmol/L (ref 22–32)
Calcium: 7.8 mg/dL — ABNORMAL LOW (ref 8.9–10.3)
Chloride: 96 mmol/L — ABNORMAL LOW (ref 101–111)
Creatinine, Ser: 2.82 mg/dL — ABNORMAL HIGH (ref 0.61–1.24)
GFR calc Af Amer: 23 mL/min — ABNORMAL LOW (ref >60)
GFR, EST NON AFRICAN AMERICAN: 20 mL/min — AB (ref >60)
GLUCOSE: 125 mg/dL — AB (ref 65–99)
POTASSIUM: 3.2 mmol/L — AB (ref 3.5–5.1)
Sodium: 134 mmol/L — ABNORMAL LOW (ref 135–145)

## 2016-06-14 LAB — LIPASE, BLOOD: Lipase: 20 U/L (ref 11–51)

## 2016-06-14 LAB — I-STAT CG4 LACTIC ACID, ED: LACTIC ACID, VENOUS: 0.91 mmol/L (ref 0.5–1.9)

## 2016-06-14 NOTE — ED Provider Notes (Signed)
[facility] DEPT Provider Note   CSN: [id number] Arrival date & time: 06/14/16  2051  By signing my name below, I, Alan [name], attest that this documentation has been prepared under the direction and in the presence of Alan Essex, MD. Electronically Signed: Oleh [name], Scribe. 06/14/16. 11:55 PM.   History   Chief Complaint Chief Complaint  Patient presents with  . Flank Pain    HPI Alan [name] is a 76 y.o. male with history of diabetes, HTN, HLD, prior CVA, bilateral BKA, and ESRD on HD MWF who presents to the ED for evaluation of flank pain. This patient states that for the last several months he has experienced everyday L flank pain which fluctuates in severity. Tonight his symptoms were more severe than typical prompting him to present to this facility. He denies any particular trauma. The family reports an instance of "green" urine yesterday. Pain is non-radiating. No fever or vomiting. No testicular pain. No history of nephrolithiasis. He typically urinates 2-3 times per day; today he has not urinated. When asked, he does report "2 to 3 minutes" of L chest pain that was mild, non-radiating several hours ago. No chest pain now. No lightheadedness.  The history is provided by the patient. No language interpreter was used.    Past Medical History:  Diagnosis Date  . Arthritis   . Asthma   . Clotted renal dialysis arteriovenous graft ([place])   . Coronary artery disease 2000   Stents [facility]  . CVA (cerebral infarction)   . DDD (degenerative disc disease), cervical   . DDD (degenerative disc disease), lumbar   . Diabetes mellitus   . ESRD (end stage renal disease) on dialysis ([place])   . Gangrene of toe ([place]) Feb. 2015   Right  great    . Gastric ulcer    EGD 2014  . Gout   . Hypercholesteremia   . Hypertension   . Stroke ([place])   . 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 Thrombosis of renal dialysis arteriovenous graft [facility])     Patient Active Problem List    Diagnosis Date Noted  . Renal dialysis device, implant, or graft complication 93/23/5573  . Septic shock ([place]) 08/28/2015  . 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 UTI (lower urinary tract infection)   . Essential hypertension 08/13/2015  . Nausea and vomiting 08/13/2015  . Pyelonephritis 08/12/2015  . ESRD (end stage renal disease) ([place]) 04/12/2015  . Gastroenteritis 04/12/2015  . S/P bilateral BKA (below knee amputation) ([place]) 04/12/2015  . HLD (hyperlipidemia) 04/12/2015  . Acute chest pain 11/16/2014  . Atherosclerosis of native arteries of the extremities with gangrene ([place]) 04/20/2013  . Sepsis ([place]) 04/13/2013  . Gram-negative bacteremia 04/12/2013  . ESRD on dialysis ([place]) 04/09/2013  . 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 Protein-calorie malnutrition, severe ([place]) 04/09/2013  . Gangrene of toe ([place]) 04/08/2013  . Toe gangrene ([place]) 04/08/2013  . Nausea with vomiting 05/22/2012  . S/P BKA (below knee amputation) unilateral, left 05/20/2012  . Musculoskeletal chest pain 05/20/2012  . Acute renal failure ([place]) 05/19/2012  . CAD (coronary artery disease) 05/19/2012  . DM type 2 causing vascular disease ([place]) 05/19/2012  . Hyperkalemia 05/19/2012    Past Surgical History:  Procedure Laterality Date  . A/V SHUNT INTERVENTION N/A 05/28/2016   Procedure: A/V Shunt Intervention;  Surgeon: Katha Cabal, MD;  Location: [facility];  Service: Cardiovascular;  Laterality: N/A;  . A/V SHUNTOGRAM Left 05/28/2016   Procedure: A/V Fistulagram;  Surgeon: Dolores Lory  Schnier, MD;  Location: [facility];  Service: Cardiovascular;  Laterality: Left;  . AMPUTATION Right 04/10/2013   Procedure: Right Below Knee Amputation;  Surgeon: Newt Minion, MD;  Location: [facility];  Service: Orthopedics;  Laterality: Right;  Right Below Knee Amputation  . APPENDECTOMY    . BELOW KNEE LEG AMPUTATION Bilateral   . CHOLECYSTECTOMY    . CORONARY ARTERY BYPASS GRAFT  1996    [place]  . ESOPHAGOGASTRODUODENOSCOPY Left 05/24/2012   TIR:WERXVQMG dilated baggy but otherwise a normal/ Small hiatal hernia. Gastric ulcer -S/P biopsy  . PERIPHERAL VASCULAR CATHETERIZATION N/A 08/09/2014   Procedure: A/V Shuntogram/Fistulagram;  Surgeon: Katha Cabal, MD;  Location: [facility];  Service: Cardiovascular;  Laterality: N/A;  . PERIPHERAL VASCULAR CATHETERIZATION Left 08/09/2014   Procedure: A/V Shunt Intervention;  Surgeon: Katha Cabal, MD;  Location: [facility];  Service: Cardiovascular;  Laterality: Left;       Home Medications    Prior to Admission medications   Medication Sig Start Date End Date Taking? Authorizing Provider  ACCU-CHEK AVIVA PLUS test strip  03/23/16   Historical Provider, MD  acetaminophen (TYLENOL) 500 MG tablet Take 1,000 mg by mouth every 8 (eight) hours as needed for mild pain or moderate pain.     Historical Provider, MD  albuterol (PROVENTIL HFA;VENTOLIN HFA) 108 (90 Base) MCG/ACT inhaler Inhale 2 puffs into the lungs every 4 (four) hours as needed for wheezing or shortness of breath.    Historical Provider, MD  Amino Acid Infusion (PROSOL) 20 % SOLN every Monday, Wednesday, and Friday.  10/27/14   Historical Provider, MD  Ascorbic Acid (VITAMIN C) 1000 MG tablet Take 1,000 mg by mouth daily.    Historical Provider, MD  aspirin EC 81 MG tablet Take 81 mg by mouth at bedtime.     Historical Provider, MD  B Complex-C-Folic Acid (NEPHRO-VITE PO) Take 1 tablet by mouth every evening.     Historical Provider, MD  budesonide-formoterol (SYMBICORT) 160-4.5 MCG/ACT inhaler Inhale 2 puffs into the lungs 2 (two) times daily as needed.     Historical Provider, MD  cinacalcet (SENSIPAR) 30 MG tablet Take 30 mg by mouth daily.    Historical Provider, MD  Coenzyme Q10 (CO Q-10) 50 MG CAPS Take 1 capsule by mouth daily.    Historical Provider, MD  diphenhydrAMINE (BENADRYL) 25 mg capsule Take 25 mg by mouth at bedtime as needed for itching.     Historical Provider, MD  furosemide (LASIX) 40 MG tablet TAKE 1/2 TABLET BY MOUTH EVERY EVENING 05/22/15   Lendon Colonel, NP  LANTUS SOLOSTAR 100 UNIT/ML Solostar Pen Inject 8 Units into the skin every evening.  12/21/13   Historical Provider, MD  Liniments (SALONPAS PAIN RELIEF PATCH EX) Place 1 patch onto the skin daily as needed.    Historical Provider, MD  nitroGLYCERIN (NITROSTAT) 0.4 MG SL tablet Place 1 tablet (0.4 mg total) under the tongue every 5 (five) minutes as needed for chest pain. 11/18/14   Samuella Cota, MD  omeprazole (PRILOSEC) 20 MG capsule Take 1 capsule (20 mg total) by mouth 2 (two) times daily before a meal. 06/17/13   Radene Gunning, NP  ondansetron (ZOFRAN) 4 MG tablet Take 1 tablet (4 mg total) by mouth every 8 (eight) hours as needed for nausea or vomiting. 08/15/15   Rexene Alberts, MD  tamsulosin (FLOMAX) 0.4 MG CAPS capsule Take 1 capsule (0.4 mg total) by mouth daily. 09/02/15  Sid Falcon, MD    Family History Family History  Problem Relation Age of Onset  . Cancer Mother   . Colon cancer Neg Hx     Social History Social History  Substance Use Topics  . Smoking status: Former Smoker    Quit date: 08/08/2004  . Smokeless tobacco: Never Used  . Alcohol use No     Allergies   Codeine and Yellow jacket venom [bee venom]   Review of Systems Review of Systems  Cardiovascular: Positive for chest pain (per HPI).  Genitourinary: Positive for decreased urine volume and flank pain. Negative for testicular pain.  All other systems reviewed and are negative.    Physical Exam Updated Vital Signs BP (!) 126/58   Pulse 66   Temp 98.4 F (36.9 C) (Oral)   Resp 20   Wt 168 lb (76.2 kg)   SpO2 99%   BMI 24.81 kg/m   Physical Exam  Constitutional: He is oriented to person, place, and time. He appears well-developed and well-nourished. No distress.  HENT:  Head: Normocephalic and atraumatic.  Mouth/Throat: Oropharynx is clear and moist. No  oropharyngeal exudate.  Eyes: Conjunctivae and EOM are normal. Pupils are equal, round, and reactive to light.  Neck: Normal range of motion. Neck supple.  No meningismus.  Cardiovascular: Normal rate, regular rhythm, normal heart sounds and intact distal pulses.   No murmur heard. Pulmonary/Chest: Effort normal and breath sounds normal. No respiratory distress.  Abdominal: Soft. There is no tenderness. There is no rebound and no guarding.  Genitourinary:  Genitourinary Comments: No testicular tenderness.   Musculoskeletal: Normal range of motion. He exhibits no edema.  L lumbar paraspinal tenderness. Bilateral BKAs.   Neurological: He is alert and oriented to person, place, and time. No cranial nerve deficit. He exhibits normal muscle tone. Coordination normal.   5/5 strength throughout. CN 2-12 intact.Equal grip strength.   Skin: Skin is warm.  Psychiatric: He has a normal mood and affect. His behavior is normal.  Nursing note and vitals reviewed.    ED Treatments / Results  Labs (all labs ordered are listed, but only abnormal results are displayed) Labs Reviewed  URINALYSIS, ROUTINE W REFLEX MICROSCOPIC - Abnormal; Notable for the following:       Result Value   APPearance CLOUDY (*)    Hgb urine dipstick SMALL (*)    Protein, ur 100 (*)    Leukocytes, UA LARGE (*)    All other components within normal limits  CBC WITH DIFFERENTIAL/PLATELET - Abnormal; Notable for the following:    RBC 3.64 (*)    Hemoglobin 11.5 (*)    HCT 33.4 (*)    All other components within normal limits  BASIC METABOLIC PANEL - Abnormal; Notable for the following:    Sodium 134 (*)    Potassium 3.2 (*)    Chloride 96 (*)    Glucose, Bld 125 (*)    BUN 41 (*)    Creatinine, Ser 2.82 (*)    Calcium 7.8 (*)    GFR calc non Af Amer 20 (*)    GFR calc Af Amer 23 (*)    All other components within normal limits  CULTURE, BLOOD (ROUTINE X 2)  CULTURE, BLOOD (ROUTINE X 2)  URINE CULTURE  LIPASE,  BLOOD  TROPONIN I  I-STAT CG4 LACTIC ACID, ED    EKG  EKG Interpretation  Date/Time:  Friday June 14 2016 23:54:03 EDT Ventricular Rate:  70 PR Interval:  QRS Duration: 96 QT Interval:  433 QTC Calculation: 468 R Axis:   0 Text Interpretation:  Sinus rhythm Anteroseptal infarct, age indeterminate No significant change was found Confirmed by Wyvonnia Dusky  MD, Cadden Elizondo [phone]) on 06/15/2016 12:02:09 AM       Radiology Dg Chest 1 View  Result Date: 06/15/2016 CLINICAL DATA:  Chest pain, dyspnea, and dysphagia since yesterday. EXAM: CHEST 1 VIEW COMPARISON:  09/04/2015 CXR FINDINGS: Low lung volumes with streaky parenchymal scarring and/or atelectasis in the right mid lung and both lower lobes. No pneumonic consolidation. Status post CABG. Aortic atherosclerosis without aneurysm. High-riding humeral heads consistent with chronic rotator cuff tear tear is with remodeled appearance of the left clavicle which can also be seen in rheumatoid arthritis. AC joint osteoarthritis noted bilaterally. IMPRESSION: Bilateral subsegmental atelectasis and/or scarring as above. Status post CABG. Aortic atherosclerosis. No acute cardiopulmonary disease. Electronically Signed   By: Ashley Royalty M.D.   On: 06/15/2016 00:32   Ct Renal Stone Study  Result Date: 06/15/2016 CLINICAL DATA:  Daily LEFT flank pain for several months, more severe tonight. History of end-stage renal disease on dialysis, diabetes, gout. EXAM: CT ABDOMEN AND PELVIS WITHOUT CONTRAST TECHNIQUE: Multidetector CT imaging of the abdomen and pelvis was performed following the standard protocol without IV contrast. COMPARISON:  CT abdomen and pelvis August 27, 2015 FINDINGS: LOWER CHEST: Bibasilar atelectasis, RIGHT pulmonary nodule not included on today's examination. 3 mm sub solid RIGHT lower lobe pulmonary nodule below size followup recommendations. Heart size is normal, no pericardial effusion. HEPATOBILIARY: Status post cholecystectomy. Hepatic  calcified granulomas, liver is otherwise unremarkable. PANCREAS: Normal. SPLEEN: Normal. ADRENALS/URINARY TRACT: Kidneys are orthotopic, demonstrating normal size and morphology. No nephrolithiasis, hydronephrosis; limited assessment for renal masses on this nonenhanced examination. The unopacified ureters are normal in course and caliber. Urinary bladder is partially distended, mild wall thickening without inflammation, unchanged. Normal adrenal glands. STOMACH/BOWEL: The stomach, small and large bowel are normal in course and caliber without inflammatory changes, sensitivity decreased by lack of enteric contrast. Moderate retained large bowel stool. VASCULAR/LYMPHATIC: Aortoiliac vessels are normal in course and caliber, mild calcific atherosclerosis. No lymphadenopathy by CT size criteria. REPRODUCTIVE: Prostate is mildly enlarged. OTHER: No intraperitoneal free fluid or free air. Surgical clips upper abdomen. MUSCULOSKELETAL: Severe RIGHT hip osteoarthrosis and chronic deformity including flattened femoral head with dense effusion. S-type scoliosis. Severe degenerative change of lumbar spine. Moderate to severe neural foraminal narrowing. Osteopenia. Chronic small RIGHT anterior abdominal wall seroma with linear calcifications. Moderate wide necked fat containing supraumbilical ventral hernia. IMPRESSION: No urolithiasis or obstructive uropathy. Moderate retained large bowel stool without bowel obstruction or acute intra-abdominal/ pelvic process. 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 Electronically Signed   By: Elon Alas M.D.   On: 06/15/2016 01:07    Procedures Procedures (including critical care time)  Medications Ordered in ED Medications - No data to display   Initial Impression / Assessment and Plan / ED Course  I have reviewed the triage vital signs and the nursing notes.  Pertinent labs & imaging results that were available during my care of the patient  were reviewed by me and considered in my medical decision making (see chart for details).    Patient with end-stage renal disease presenting with a three-day history of dysuria, foul-smelling urine and clots with urination. No fever, nausea, vomiting or diarrhea. In recent episodes of atypical chest pain lasting a few seconds at a time and are worse with palpation.  Patient's vitals are stable. Lactate is normal. No leukocytosis. Urinalysis consistent with infection. Blood and urine cultures sent. Patient with septic shock in 2017 with Klebsiella's sensitive to Rocephin.  CT Scan obtained does not show any kidney stones or hydronephrosis. EKG is normal sinus rhythm. Troponin is negative.  Given his age and ESRD, plan admission for complicated UTI.  Chest pain appears atypical for ACS.  IV rocephin given. Admission d/w Dr. Marin Comment.     Final Clinical Impressions(s) / ED Diagnoses   Final diagnoses:  Flank pain  Pyelonephritis  Atypical chest pain  ESRD (end stage renal disease) ([place])    New Prescriptions New Prescriptions   No medications on file  I personally performed the services described in this documentation, which was scribed in my presence. The recorded information has been reviewed and is accurate.    Alan Essex, MD 06/15/16 [phone]

## 2016-06-14 NOTE — ED Triage Notes (Signed)
Pt reports dysuria, urinary frequency, urgency, hematuria, left flank pain x 4 days and chest pain and sob x 1 day.

## 2016-06-14 NOTE — ED Notes (Addendum)
Pt reports he has been having dysuria, foul smelling urine, and "green clots". Denies fever, V/D/, or hx of nephrolithiasis.

## 2016-06-14 NOTE — ED Triage Notes (Signed)
Multiple complaints- back, chest, ? UTI  [facility] is PCP

## 2016-06-15 ENCOUNTER — Emergency Department (HOSPITAL_COMMUNITY): Payer: Medicare Other

## 2016-06-15 DIAGNOSIS — N390 Urinary tract infection, site not specified: Secondary | ICD-10-CM | POA: Diagnosis not present

## 2016-06-15 DIAGNOSIS — E1159 Type 2 diabetes mellitus with other circulatory complications: Secondary | ICD-10-CM | POA: Diagnosis not present

## 2016-06-15 DIAGNOSIS — R0789 Other chest pain: Secondary | ICD-10-CM

## 2016-06-15 DIAGNOSIS — R079 Chest pain, unspecified: Secondary | ICD-10-CM | POA: Diagnosis present

## 2016-06-15 DIAGNOSIS — N186 End stage renal disease: Secondary | ICD-10-CM | POA: Diagnosis not present

## 2016-06-15 LAB — CREATININE, SERUM
CREATININE: 3.46 mg/dL — AB (ref 0.61–1.24)
GFR calc Af Amer: 18 mL/min — ABNORMAL LOW (ref >60)
GFR calc non Af Amer: 16 mL/min — ABNORMAL LOW (ref >60)

## 2016-06-15 LAB — CBC
HCT: 33.6 % — ABNORMAL LOW (ref 39.0–52.0)
Hemoglobin: 11.4 g/dL — ABNORMAL LOW (ref 13.0–17.0)
MCH: 31.1 pg (ref 26.0–34.0)
MCHC: 33.9 g/dL (ref 30.0–36.0)
MCV: 91.6 fL (ref 78.0–100.0)
PLATELETS: 181 10*3/uL (ref 150–400)
RBC: 3.67 MIL/uL — ABNORMAL LOW (ref 4.22–5.81)
RDW: 13.1 % (ref 11.5–15.5)
WBC: 8.4 10*3/uL (ref 4.0–10.5)

## 2016-06-15 LAB — GLUCOSE, CAPILLARY
GLUCOSE-CAPILLARY: 166 mg/dL — AB (ref 65–99)
GLUCOSE-CAPILLARY: 146 mg/dL — AB (ref 65–99)
Glucose-Capillary: 96 mg/dL (ref 65–99)

## 2016-06-15 LAB — URINALYSIS, ROUTINE W REFLEX MICROSCOPIC
BACTERIA UA: NONE SEEN
Bilirubin Urine: NEGATIVE
Glucose, UA: NEGATIVE mg/dL
Ketones, ur: NEGATIVE mg/dL
Nitrite: NEGATIVE
PROTEIN: 100 mg/dL — AB
SQUAMOUS EPITHELIAL / LPF: NONE SEEN
Specific Gravity, Urine: 1.009 (ref 1.005–1.030)
pH: 7 (ref 5.0–8.0)

## 2016-06-15 LAB — TROPONIN I: Troponin I: <0.03 ng/mL (ref <0.03)

## 2016-06-15 MED ORDER — INSULIN ASPART 100 UNIT/ML ~~LOC~~ SOLN: 0.0000 [IU] | Freq: Every day | SUBCUTANEOUS | Status: DC

## 2016-06-15 MED ORDER — VITAMIN C 500 MG PO TABS
1000.0000 mg | ORAL_TABLET | Freq: Every day | ORAL | Status: DC
  Administered 2016-06-15: 1000 mg via ORAL
  Filled 2016-06-15: qty 2

## 2016-06-15 MED ORDER — CINACALCET HCL 30 MG PO TABS
30.0000 mg | ORAL_TABLET | Freq: Every day | ORAL | Status: DC
  Administered 2016-06-15: 30 mg via ORAL
  Filled 2016-06-15: qty 1

## 2016-06-15 MED ORDER — ENSURE ENLIVE PO LIQD
237.0000 mL | Freq: Two times a day (BID) | ORAL | Status: DC
  Administered 2016-06-15: 237 mL via ORAL

## 2016-06-15 MED ORDER — ONDANSETRON HCL 4 MG PO TABS: 4.0000 mg | ORAL_TABLET | Freq: Four times a day (QID) | ORAL | Status: DC | PRN

## 2016-06-15 MED ORDER — NEPHRO-VITE 0.8 MG PO TABS
1.0000 | ORAL_TABLET | Freq: Every day | ORAL | Status: DC
  Filled 2016-06-15 (×2): qty 1

## 2016-06-15 MED ORDER — ONDANSETRON HCL 4 MG PO TABS: 4.0000 mg | ORAL_TABLET | Freq: Three times a day (TID) | ORAL | Status: DC | PRN

## 2016-06-15 MED ORDER — DEXTROSE 5 % IV SOLN
1.0000 g | INTRAVENOUS | Status: DC
  Filled 2016-06-15 (×2): qty 10

## 2016-06-15 MED ORDER — NEPRO/CARBSTEADY PO LIQD: 237.0000 mL | Freq: Two times a day (BID) | ORAL | Status: DC

## 2016-06-15 MED ORDER — DEXTROSE 5 % IV SOLN
INTRAVENOUS | Status: AC
  Filled 2016-06-15: qty 10

## 2016-06-15 MED ORDER — HEPARIN SODIUM (PORCINE) 5000 UNIT/ML IJ SOLN
5000.0000 [IU] | Freq: Three times a day (TID) | INTRAMUSCULAR | Status: DC
  Administered 2016-06-15 (×2): 5000 [IU] via SUBCUTANEOUS
  Filled 2016-06-15 (×2): qty 1

## 2016-06-15 MED ORDER — ONDANSETRON HCL 4 MG/2ML IJ SOLN: 4.0000 mg | Freq: Four times a day (QID) | INTRAMUSCULAR | Status: DC | PRN

## 2016-06-15 MED ORDER — TAMSULOSIN HCL 0.4 MG PO CAPS
0.4000 mg | ORAL_CAPSULE | Freq: Every day | ORAL | Status: DC
  Administered 2016-06-15: 0.4 mg via ORAL
  Filled 2016-06-15: qty 1

## 2016-06-15 MED ORDER — PANTOPRAZOLE SODIUM 40 MG PO TBEC
40.0000 mg | DELAYED_RELEASE_TABLET | Freq: Every day | ORAL | Status: DC
  Administered 2016-06-15: 40 mg via ORAL
  Filled 2016-06-15: qty 1

## 2016-06-15 MED ORDER — ALBUTEROL SULFATE (2.5 MG/3ML) 0.083% IN NEBU: 3.0000 mL | INHALATION_SOLUTION | RESPIRATORY_TRACT | Status: DC | PRN

## 2016-06-15 MED ORDER — INSULIN ASPART 100 UNIT/ML ~~LOC~~ SOLN
0.0000 [IU] | Freq: Three times a day (TID) | SUBCUTANEOUS | Status: DC
  Administered 2016-06-15: 2 [IU] via SUBCUTANEOUS

## 2016-06-15 MED ORDER — GLUCERNA SHAKE PO LIQD: 237.0000 mL | Freq: Two times a day (BID) | ORAL | Status: DC

## 2016-06-15 MED ORDER — NITROGLYCERIN 0.4 MG SL SUBL: 0.4000 mg | SUBLINGUAL_TABLET | SUBLINGUAL | Status: DC | PRN

## 2016-06-15 MED ORDER — MOMETASONE FURO-FORMOTEROL FUM 200-5 MCG/ACT IN AERO
2.0000 | INHALATION_SPRAY | Freq: Two times a day (BID) | RESPIRATORY_TRACT | Status: DC
  Administered 2016-06-15: 2 via RESPIRATORY_TRACT
  Filled 2016-06-15: qty 8.8

## 2016-06-15 MED ORDER — CIPROFLOXACIN HCL 500 MG PO TABS: 500.0000 mg | ORAL_TABLET | Freq: Every day | ORAL | 0 refills | Status: DC

## 2016-06-15 MED ORDER — ASPIRIN EC 81 MG PO TBEC: 81.0000 mg | DELAYED_RELEASE_TABLET | Freq: Every day | ORAL | Status: DC

## 2016-06-15 MED ORDER — BISACODYL 10 MG RE SUPP
10.0000 mg | Freq: Two times a day (BID) | RECTAL | Status: DC
  Administered 2016-06-15: 10 mg via RECTAL
  Filled 2016-06-15: qty 1

## 2016-06-15 MED ORDER — ACETAMINOPHEN 500 MG PO TABS: 1000.0000 mg | ORAL_TABLET | Freq: Three times a day (TID) | ORAL | Status: DC | PRN

## 2016-06-15 MED ORDER — DEXTROSE 5 % IV SOLN
1.0000 g | Freq: Once | INTRAVENOUS | Status: AC
  Administered 2016-06-15: 1 g via INTRAVENOUS

## 2016-06-15 MED ORDER — INSULIN GLARGINE 100 UNIT/ML ~~LOC~~ SOLN
8.0000 [IU] | Freq: Every evening | SUBCUTANEOUS | Status: DC
  Filled 2016-06-15 (×2): qty 0.08

## 2016-06-15 MED ORDER — DIPHENHYDRAMINE HCL 25 MG PO CAPS: 25.0000 mg | ORAL_CAPSULE | Freq: Every evening | ORAL | Status: DC | PRN

## 2016-06-15 MED ORDER — CO Q-10 50 MG PO CAPS: 1.0000 | ORAL_CAPSULE | Freq: Every day | ORAL | Status: DC

## 2016-06-15 MED ORDER — BISACODYL 5 MG PO TBEC
10.0000 mg | DELAYED_RELEASE_TABLET | Freq: Two times a day (BID) | ORAL | Status: DC
  Administered 2016-06-15: 10 mg via ORAL
  Filled 2016-06-15: qty 2

## 2016-06-15 MED ORDER — FUROSEMIDE 20 MG PO TABS: 20.0000 mg | ORAL_TABLET | Freq: Every evening | ORAL | Status: DC

## 2016-06-15 NOTE — Progress Notes (Signed)
PHARMACIST - PHYSICIAN ORDER COMMUNICATION  CONCERNING: P&T Medication Policy on Herbal Medications  DESCRIPTION:  This patient's order for:  Co Q 10  has been noted.  This product(s) is classified as an "herbal" or natural product. Due to a lack of definitive safety studies or FDA approval, nonstandard manufacturing practices, plus the potential risk of unknown drug-drug interactions while on inpatient medications, the Pharmacy and Therapeutics Committee does not permit the use of "herbal" or natural products of this type within [facility].   ACTION TAKEN: The pharmacy department is unable to verify this order at this time and your patient has been informed of this safety policy. Please reevaluate patient's clinical condition at discharge and address if the herbal or natural product(s) should be resumed at that time. PHARMACIST - PHYSICIAN ORDER COMMUNICATION

## 2016-06-15 NOTE — Discharge Summary (Signed)
Alan [name], is a 76 y.o. male  DOB 08-Mar-1940  MRN [id number].  Admission date:  06/14/2016  Admitting Physician  Orvan Falconer, MD  Discharge Date:  06/15/2016   Primary [facility]  Recommendations for primary care physician for things to follow:  - please check CBC, BMP during next visit, please check final results on urine culture .  Admission Diagnosis  Pyelonephritis [N12] Pain [R52] Atypical chest pain [R07.89] ESRD (end stage renal disease) ([place]) [N18.6] Flank pain [R10.9]   Discharge Diagnosis  Pyelonephritis [N12] Pain [R52] Atypical chest pain [R07.89] ESRD (end stage renal disease) ([place]) [N18.6] Flank pain [R10.9]    Principal Problem:   Lower urinary tract infectious disease Active Problems:   DM type 2 causing vascular disease ([place])   ESRD on dialysis ([place])   Atypical chest pain      Past Medical History:  Diagnosis Date  . Arthritis   . Asthma   . Clotted renal dialysis arteriovenous graft ([place])   . Coronary artery disease 2000   Stents [facility]  . CVA (cerebral infarction)   . DDD (degenerative disc disease), cervical   . DDD (degenerative disc disease), lumbar   . Diabetes mellitus   . ESRD (end stage renal disease) on dialysis ([place])   . Gangrene of toe ([place]) Feb. 2015   Right  great    . Gastric ulcer    EGD 2014  . Gout   . Hypercholesteremia   . Hypertension   . Stroke ([place])   . 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 Thrombosis of renal dialysis arteriovenous graft [facility])     Past Surgical History:  Procedure Laterality Date  . A/V SHUNT INTERVENTION N/A 05/28/2016   Procedure: A/V Shunt Intervention;  Surgeon: Katha Cabal, MD;  Location: [place] [facility] LAB;  Service: Cardiovascular;  Laterality: N/A;  . A/V SHUNTOGRAM Left 05/28/2016   Procedure: A/V Fistulagram;  Surgeon: Katha Cabal, MD;  Location: [place] [facility]  LAB;  Service: Cardiovascular;  Laterality: Left;  . AMPUTATION Right 04/10/2013   Procedure: Right Below Knee Amputation;  Surgeon: Newt Minion, MD;  Location: [place];  Service: Orthopedics;  Laterality: Right;  Right Below Knee Amputation  . APPENDECTOMY    . BELOW KNEE LEG AMPUTATION Bilateral   . CHOLECYSTECTOMY    . CORONARY ARTERY BYPASS GRAFT  1996   [place]  . ESOPHAGOGASTRODUODENOSCOPY Left 05/24/2012   FKC:LEXNTZGY dilated baggy but otherwise a normal/ Small hiatal hernia. Gastric ulcer -S/P biopsy  . PERIPHERAL VASCULAR CATHETERIZATION N/A 08/09/2014   Procedure: A/V Shuntogram/Fistulagram;  Surgeon: Katha Cabal, MD;  Location: [place] CV LAB;  Service: Cardiovascular;  Laterality: N/A;  . PERIPHERAL VASCULAR CATHETERIZATION Left 08/09/2014   Procedure: A/V Shunt Intervention;  Surgeon: Katha Cabal, MD;  Location: [place] [facility] LAB;  Service: Cardiovascular;  Laterality: Left;       History of present illness and  Hospital Course:     Kindly see H&P for history of  present illness and admission details, please review complete Labs, Consult reports and Test reports for all details in brief  HPI  from the history and physical done on the day of admission 06/15/2016 Alan [name] is an 76 y.o. male with hx of ESRD on HD ( M W Fr), HLD, prior CVA, HTN, gout, DM on insulin, prior UTI with sepsis, presented to the ER with left flank pain.  He did not have fever, chills, nausea or vomiting.  He also noted that he has had intermittent palpable left sided CP for 2 days.  No SOB or diaphoresis.  Work up in the ER included a UA with TNTC WBCs, and spiral CT showed no kidney stone.  EKG showed normal sinus rhythm with no acute ST T changes, and troponin was negative.  He was given IV Rocephin, and hospitalist was asked to admit him OBS for his atypical chest pain, suggesting cycling of his troponins, and to proceed with Tx of his pyelonephritis, given his comorbities of DM and ESRD  on HD. He is now pain free, and has stable hemodynamics.    Hospital Course   nontypical Chest pain - Nontypical, clearly musculoskeletal, reproducible by palpation, no evidence of ACS, EKG nonacute, troponins negative 3,  UTI - No evidence of pyelonephritis on imaging or physical exam, as well what he had is more musculoskeletal pain than actual CVA tenderness, sees one dose of Rocephin during hospital stay, Alan [name] discharge in another 6 days of Cipro based on previous urine culture which did grow Klebsiella sensitive to quinolones, these follow on the final results of urine culture.  ESRD on HD:    - to continue his controlled hemodialysis on Monday as an outpatient   HTN:   - [name] continue with his meds.  DM:   - [name] continue low dose Lantus, and sensitive SSI.  No meal coverages    Discharge Condition:  stable   Follow UP  [facility] Follow up in 1 week(s).   Contact information: [address] [place] [address] [phone]             Discharge Instructions  and  Discharge Medications    Discharge Instructions    Discharge instructions    Complete by:  As directed    Follow with Primary [facility] in 7 days   Get CBC, CMP, checked  by Primary MD next visit.    Activity: As tolerated with Full fall precautions use walker/cane & assistance as needed   Disposition Home    Diet: renal diet with fluid restriction , with feeding assistance and aspiration precautions.   On your next visit with your primary care physician please Get Medicines reviewed and adjusted.   Please request your Prim.MD to go over all Hospital Tests and Procedure/Radiological results at the follow up, please get all Hospital records sent to your Prim MD by signing hospital release before you go home.   If you experience worsening of your admission symptoms, develop shortness of breath, life  threatening emergency, suicidal or homicidal thoughts you must seek medical attention immediately by calling 911 or calling your MD immediately  if symptoms less severe.  You Must read complete instructions/literature along with all the possible adverse reactions/side effects for all the Medicines you take and that have been prescribed to you. Take any new Medicines after you have completely understood and accpet all the possible adverse reactions/side  effects.   Do not drive, operating heavy machinery, perform activities at heights, swimming or participation in water activities or provide baby sitting services if your were admitted for syncope or siezures until you have seen by Primary MD or a Neurologist and advised to do so again.  Do not drive when taking Pain medications.    Do not take more than prescribed Pain, Sleep and Anxiety Medications  Special Instructions: If you have smoked or chewed Tobacco  in the last 2 yrs please stop smoking, stop any regular Alcohol  and or any Recreational drug use.  Wear Seat belts while driving.   Please note  You were cared for by a hospitalist during your hospital stay. If you have any questions about your discharge medications or the care you received while you were in the hospital after you are discharged, you can call the unit and asked to speak with the hospitalist on call if the hospitalist that took care of you is not available. Once you are discharged, your primary care physician Alan [name] handle any further medical issues. Please note that NO REFILLS for any discharge medications Alan [name] be authorized once you are discharged, as it is imperative that you return to your primary care physician (or establish a relationship with a primary care physician if you do not have one) for your aftercare needs so that they can reassess your need for medications and monitor your lab values.   Discharge instructions    Complete by:  As directed    Follow with Primary  [facility] in 7 days   Get CBC, CMP, checked  by Primary MD next visit.    Activity: As tolerated with Full fall precautions use walker/cane & assistance as needed   Disposition Home    Diet: renal diet with fluid restriction , with feeding assistance and aspiration precautions.   On your next visit with your primary care physician please Get Medicines reviewed and adjusted.   Please request your Prim.MD to go over all Hospital Tests and Procedure/Radiological results at the follow up, please get all Hospital records sent to your Prim MD by signing hospital release before you go home.   If you experience worsening of your admission symptoms, develop shortness of breath, life threatening emergency, suicidal or homicidal thoughts you must seek medical attention immediately by calling 911 or calling your MD immediately  if symptoms less severe.  You Must read complete instructions/literature along with all the possible adverse reactions/side effects for all the Medicines you take and that have been prescribed to you. Take any new Medicines after you have completely understood and accpet all the possible adverse reactions/side effects.   Do not drive, operating heavy machinery, perform activities at heights, swimming or participation in water activities or provide baby sitting services if your were admitted for syncope or siezures until you have seen by Primary MD or a Neurologist and advised to do so again.  Do not drive when taking Pain medications.    Do not take more than prescribed Pain, Sleep and Anxiety Medications  Special Instructions: If you have smoked or chewed Tobacco  in the last 2 yrs please stop smoking, stop any regular Alcohol  and or any Recreational drug use.  Wear Seat belts while driving.   Please note  You were cared for by a hospitalist during your hospital stay. If you have any questions about your discharge medications or the  care you received while you were  in the hospital after you are discharged, you can call the unit and asked to speak with the hospitalist on call if the hospitalist that took care of you is not available. Once you are discharged, your primary care physician Alan [name] handle any further medical issues. Please note that NO REFILLS for any discharge medications Alan [name] be authorized once you are discharged, as it is imperative that you return to your primary care physician (or establish a relationship with a primary care physician if you do not have one) for your aftercare needs so that they can reassess your need for medications and monitor your lab values.     Allergies as of 06/15/2016      Reactions   Codeine Itching, Nausea And Vomiting   Yellow Jacket Venom [bee Venom] Swelling      Medication List    TAKE these medications   ACCU-CHEK AVIVA PLUS test strip Generic drug:  glucose blood   acetaminophen 500 MG tablet Commonly known as:  TYLENOL Take 1,000 mg by mouth every 8 (eight) hours as needed for mild pain or moderate pain.   albuterol 108 (90 Base) MCG/ACT inhaler Commonly known as:  PROVENTIL HFA;VENTOLIN HFA Inhale 2 puffs into the lungs every 4 (four) hours as needed for wheezing or shortness of breath.   aspirin EC 81 MG tablet Take 81 mg by mouth at bedtime.   budesonide-formoterol 160-4.5 MCG/ACT inhaler Commonly known as:  SYMBICORT Inhale 2 puffs into the lungs 2 (two) times daily as needed.   cinacalcet 30 MG tablet Commonly known as:  SENSIPAR Take 30 mg by mouth daily.   ciprofloxacin 500 MG tablet Commonly known as:  CIPRO Take 1 tablet (500 mg total) by mouth daily with breakfast.   Co Q-10 50 MG Caps Take 1 capsule by mouth daily.   diphenhydrAMINE 25 mg capsule Commonly known as:  BENADRYL Take 25 mg by mouth at bedtime as needed for itching.   furosemide 40 MG tablet Commonly known as:  LASIX TAKE 1/2 TABLET BY MOUTH EVERY EVENING   LANTUS SOLOSTAR 100  UNIT/ML Solostar Pen Generic drug:  Insulin Glargine Inject 8 Units into the skin every evening.   NEPHRO-VITE PO Take 1 tablet by mouth every evening.   nitroGLYCERIN 0.4 MG SL tablet Commonly known as:  NITROSTAT Place 1 tablet (0.4 mg total) under the tongue every 5 (five) minutes as needed for chest pain.   omeprazole 20 MG capsule Commonly known as:  PRILOSEC Take 1 capsule (20 mg total) by mouth 2 (two) times daily before a meal.   ondansetron 4 MG tablet Commonly known as:  ZOFRAN Take 1 tablet (4 mg total) by mouth every 8 (eight) hours as needed for nausea or vomiting.   PROSOL 20 % Soln every Monday, Wednesday, and Friday.   SALONPAS PAIN RELIEF PATCH EX Place 1 patch onto the skin daily as needed.   tamsulosin 0.4 MG Caps capsule Commonly known as:  FLOMAX Take 1 capsule (0.4 mg total) by mouth daily.   vitamin C 1000 MG tablet Take 1,000 mg by mouth daily.         Diet and Activity recommendation: See Discharge Instructions above   Consults obtained -  none   Major procedures and Radiology Reports - PLEASE review detailed and final reports for all details, in brief -      Dg Chest 1 View  Result Date: 06/15/2016 CLINICAL DATA:  Chest pain, dyspnea, and dysphagia since yesterday. EXAM: CHEST 1 VIEW  COMPARISON:  09/04/2015 CXR FINDINGS: Low lung volumes with streaky parenchymal scarring and/or atelectasis in the right mid lung and both lower lobes. No pneumonic consolidation. Status post CABG. Aortic atherosclerosis without aneurysm. High-riding humeral heads consistent with chronic rotator cuff tear tear is with remodeled appearance of the left clavicle which can also be seen in rheumatoid arthritis. AC joint osteoarthritis noted bilaterally. IMPRESSION: Bilateral subsegmental atelectasis and/or scarring as above. Status post CABG. Aortic atherosclerosis. No acute cardiopulmonary disease. Electronically Signed   By: Ashley Royalty M.D.   On: 06/15/2016 00:32    Ct Renal Stone Study  Result Date: 06/15/2016 CLINICAL DATA:  Daily LEFT flank pain for several months, more severe tonight. History of end-stage renal disease on dialysis, diabetes, gout. EXAM: CT ABDOMEN AND PELVIS WITHOUT CONTRAST TECHNIQUE: Multidetector CT imaging of the abdomen and pelvis was performed following the standard protocol without IV contrast. COMPARISON:  CT abdomen and pelvis August 27, 2015 FINDINGS: LOWER CHEST: Bibasilar atelectasis, RIGHT pulmonary nodule not included on today's examination. 3 mm sub solid RIGHT lower lobe pulmonary nodule below size followup recommendations. Heart size is normal, no pericardial effusion. HEPATOBILIARY: Status post cholecystectomy. Hepatic calcified granulomas, liver is otherwise unremarkable. PANCREAS: Normal. SPLEEN: Normal. ADRENALS/URINARY TRACT: Kidneys are orthotopic, demonstrating normal size and morphology. No nephrolithiasis, hydronephrosis; limited assessment for renal masses on this nonenhanced examination. The unopacified ureters are normal in course and caliber. Urinary bladder is partially distended, mild wall thickening without inflammation, unchanged. Normal adrenal glands. STOMACH/BOWEL: The stomach, small and large bowel are normal in course and caliber without inflammatory changes, sensitivity decreased by lack of enteric contrast. Moderate retained large bowel stool. VASCULAR/LYMPHATIC: Aortoiliac vessels are normal in course and caliber, mild calcific atherosclerosis. No lymphadenopathy by CT size criteria. REPRODUCTIVE: Prostate is mildly enlarged. OTHER: No intraperitoneal free fluid or free air. Surgical clips upper abdomen. MUSCULOSKELETAL: Severe RIGHT hip osteoarthrosis and chronic deformity including flattened femoral head with dense effusion. S-type scoliosis. Severe degenerative change of lumbar spine. Moderate to severe neural foraminal narrowing. Osteopenia. Chronic small RIGHT anterior abdominal wall seroma with linear  calcifications. Moderate wide necked fat containing supraumbilical ventral hernia. IMPRESSION: No urolithiasis or obstructive uropathy. Moderate retained large bowel stool without bowel obstruction or acute intra-abdominal/ pelvic process. 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 Electronically Signed   By: Elon Alas M.D.   On: 06/15/2016 01:07    Micro Results    Recent Results (from the past 240 hour(s))  Blood culture (routine x 2)     Status: None (Preliminary result)   Collection Time: 06/14/16 11:27 PM  Result Value Ref Range Status   Specimen Description BLOOD RIGHT ARM  Final   Special Requests   Final    BOTTLES DRAWN AEROBIC AND ANAEROBIC Blood Culture adequate volume   Culture NO GROWTH < 12 HOURS  Final   Report Status PENDING  Incomplete  Blood culture (routine x 2)     Status: None (Preliminary result)   Collection Time: 06/14/16 11:54 PM  Result Value Ref Range Status   Specimen Description RIGHT ANTECUBITAL  Final   Special Requests   Final    BOTTLES DRAWN AEROBIC AND ANAEROBIC Blood Culture results may not be optimal due to an inadequate volume of blood received in culture bottles   Culture NO GROWTH < 12 HOURS  Final   Report Status PENDING  Incomplete       Today   Subjective:   Alan [name] today has no  headache,no abdominal pain,no new weakness tingling or numbness, feels much better wants to go home today. Still with nontypical chest pain reproducible by palpation.  Objective:   Blood pressure 140/62, pulse 70, temperature 98 F (36.7 C), temperature source Oral, resp. rate 18, weight 73.4 kg (161 lb 13.1 oz), SpO2 99 %.   Intake/Output Summary (Last 24 hours) at 06/15/16 1655 Last data filed at 06/15/16 1200  Gross per 24 hour  Intake              530 ml  Output              650 ml  Net             -120 ml    Exam Awake Alert, Oriented x 3,  Supple Neck,No JVD,  Symmetrical Chest wall movement, Good air  movement bilaterally, CTAB, has reproducible chest pain and palpitation RRR,No Gallops,Rubs or new Murmurs, No Parasternal Heave +ve B.Sounds, Abd Soft, Non tender,  No rebound -guarding or rigidity. Power 5 x 5 , B/L amputee  Data Review   CBC w Diff: Lab Results  Component Value Date   WBC 8.4 06/15/2016   HGB 11.4 (L) 06/15/2016   HGB 11.7 (L) 07/02/2012   HCT 33.6 (L) 06/15/2016   HCT 35.8 (L) 07/02/2012   PLT 181 06/15/2016   PLT 253 07/02/2012   LYMPHOPCT 28 06/14/2016   MONOPCT 6 06/14/2016   EOSPCT 1 06/14/2016   BASOPCT 0 06/14/2016    CMP: Lab Results  Component Value Date   NA 134 (L) 06/14/2016   NA 136 09/03/2012   K 3.2 (L) 06/14/2016   K 3.9 09/03/2012   CL 96 (L) 06/14/2016   CL 100 09/03/2012   CO2 30 06/14/2016   CO2 30 09/03/2012   BUN 41 (H) 06/14/2016   BUN 46 (H) 09/03/2012   CREATININE 3.46 (H) 06/15/2016   CREATININE 6.12 (H) 09/03/2012   PROT 6.5 08/27/2015   ALBUMIN 2.0 (L) 08/28/2015   BILITOT 1.2 08/27/2015   ALKPHOS 208 (H) 08/27/2015   AST 26 08/27/2015   ALT 16 (L) 08/27/2015  .   Total Time in preparing paper work, data evaluation and todays exam - 25 minutes  Alan [name] M.D on 06/15/2016 at 4:55 PM  [facility]   Office  [phone]

## 2016-06-15 NOTE — H&P (Signed)
History and Physical    Alan [name] OHY:[id number] DOB: Sep 08, 1940 DOA: 06/14/2016  PCP: [facility]  Patient coming from: Home.   Chief Complaint:   Left flank pain. ( and chest pain elicit with routine ROS).  HPI: Alan [name] is an 76 y.o. male with hx of ESRD on HD ( M W Fr), HLD, prior CVA, HTN, gout, DM on insulin, prior UTI with sepsis, presented to the ER with left flank pain.  He did not have fever, chills, nausea or vomiting.  He also noted that he has had intermittent palpable left sided CP for 2 days.  No SOB or diaphoresis.  Work up in the ER included a UA with TNTC WBCs, and spiral CT showed no kidney stone.  EKG showed normal sinus rhythm with no acute ST T changes, and troponin was negative.  He was given IV Rocephin, and hospitalist was asked to admit him OBS for his atypical chest pain, suggesting cycling of his troponins, and to proceed with Tx of his pyelonephritis, given his comorbities of DM and ESRD on HD. He is now pain free, and has stable hemodynamics.   ED Course:  See above.  Rewiew of Systems:  Constitutional: Negative for malaise, fever and chills. No significant weight loss or weight gain Eyes: Negative for eye pain, redness and discharge, diplopia, visual changes, or flashes of light. ENMT: Negative for ear pain, hoarseness, nasal congestion, sinus pressure and sore throat. No headaches; tinnitus, drooling, or problem swallowing. Cardiovascular: Negative for  palpitations, diaphoresis, dyspnea and peripheral edema. ; No orthopnea, PND Respiratory: Negative for cough, hemoptysis, wheezing and stridor. No pleuritic chestpain. Gastrointestinal: Negative for diarrhea, constipation,  melena, blood in stool, hematemesis, jaundice and rectal bleeding.    Genitourinary: Negative for frequency, dysuria, incontinence,flank pain and hematuria; Musculoskeletal: Negative for neck pain. Negative for swelling and trauma.;  Skin: . Negative for  pruritus, rash, abrasions, bruising and skin lesion.; ulcerations Neuro: Negative for headache, lightheadedness and neck stiffness. Negative for weakness, altered level of consciousness , altered mental status, extremity weakness, burning feet, involuntary movement, seizure and syncope.  Psych: negative for anxiety, depression, insomnia, tearfulness, panic attacks, hallucinations, paranoia, suicidal or homicidal ideation    Past Medical History:  Diagnosis Date  . Arthritis   . Asthma   . Clotted renal dialysis arteriovenous graft ([place])   . Coronary artery disease 2000   Stents [facility]  . CVA (cerebral infarction)   . DDD (degenerative disc disease), cervical   . DDD (degenerative disc disease), lumbar   . Diabetes mellitus   . ESRD (end stage renal disease) on dialysis ([place])   . Gangrene of toe ([place]) Feb. 2015   Right  great    . Gastric ulcer    EGD 2014  . Gout   . Hypercholesteremia   . Hypertension   . Stroke ([place])   . 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 Thrombosis of renal dialysis arteriovenous graft [facility])    Past Surgical History:  Procedure Laterality Date  . A/V SHUNT INTERVENTION N/A 05/28/2016   Procedure: A/V Shunt Intervention;  Surgeon: Katha Cabal, MD;  Location: [place] [facility];  Service: Cardiovascular;  Laterality: N/A;  . A/V SHUNTOGRAM Left 05/28/2016   Procedure: A/V Fistulagram;  Surgeon: Katha Cabal, MD;  Location: [place] [facility];  Service: Cardiovascular;  Laterality: Left;  . AMPUTATION Right 04/10/2013   Procedure: Right Below Knee Amputation;  Surgeon: Newt Minion, MD;  Location: [place];  Service: Orthopedics;  Laterality: Right;  Right Below Knee Amputation  . APPENDECTOMY    . BELOW KNEE LEG AMPUTATION Bilateral   . CHOLECYSTECTOMY    . CORONARY ARTERY BYPASS GRAFT  1996   [place]  . ESOPHAGOGASTRODUODENOSCOPY Left 05/24/2012   PZW:CHENIDPO dilated baggy but otherwise a normal/ Small hiatal hernia. Gastric ulcer -S/P biopsy  .  PERIPHERAL VASCULAR CATHETERIZATION N/A 08/09/2014   Procedure: A/V Shuntogram/Fistulagram;  Surgeon: Katha Cabal, MD;  Location: [place] [facility];  Service: Cardiovascular;  Laterality: N/A;  . PERIPHERAL VASCULAR CATHETERIZATION Left 08/09/2014   Procedure: A/V Shunt Intervention;  Surgeon: Katha Cabal, MD;  Location: [place] [facility];  Service: Cardiovascular;  Laterality: Left;     reports that he quit smoking about 11 years ago. He has never used smokeless tobacco. He reports that he does not drink alcohol or use drugs.  Allergies  Allergen Reactions  . Codeine Itching and Nausea And Vomiting  . [place] Venom [Bee Venom] Swelling    Family History  Problem Relation Age of Onset  . Cancer Mother   . Colon cancer Neg Hx      Prior to Admission medications   Medication Sig Start Date End Date Taking? Authorizing Provider  ACCU-CHEK AVIVA PLUS test strip  03/23/16   Historical Provider, MD  acetaminophen (TYLENOL) 500 MG tablet Take 1,000 mg by mouth every 8 (eight) hours as needed for mild pain or moderate pain.     Historical Provider, MD  albuterol (PROVENTIL HFA;VENTOLIN HFA) 108 (90 Base) MCG/ACT inhaler Inhale 2 puffs into the lungs every 4 (four) hours as needed for wheezing or shortness of breath.    Historical Provider, MD  Amino Acid Infusion (PROSOL) 20 % SOLN every Monday, Wednesday, and Friday.  10/27/14   Historical Provider, MD  Ascorbic Acid (VITAMIN C) 1000 MG tablet Take 1,000 mg by mouth daily.    Historical Provider, MD  aspirin EC 81 MG tablet Take 81 mg by mouth at bedtime.     Historical Provider, MD  B Complex-C-Folic Acid (NEPHRO-VITE PO) Take 1 tablet by mouth every evening.     Historical Provider, MD  budesonide-formoterol (SYMBICORT) 160-4.5 MCG/ACT inhaler Inhale 2 puffs into the lungs 2 (two) times daily as needed.     Historical Provider, MD  cinacalcet (SENSIPAR) 30 MG tablet Take 30 mg by mouth daily.    Historical Provider, MD    Coenzyme Q10 (CO Q-10) 50 MG CAPS Take 1 capsule by mouth daily.    Historical Provider, MD  diphenhydrAMINE (BENADRYL) 25 mg capsule Take 25 mg by mouth at bedtime as needed for itching.    Historical Provider, MD  furosemide (LASIX) 40 MG tablet TAKE 1/2 TABLET BY MOUTH EVERY EVENING 05/22/15   Lendon Colonel, NP  LANTUS SOLOSTAR 100 UNIT/ML Solostar Pen Inject 8 Units into the skin every evening.  12/21/13   Historical Provider, MD  Liniments (SALONPAS PAIN RELIEF PATCH EX) Place 1 patch onto the skin daily as needed.    Historical Provider, MD  nitroGLYCERIN (NITROSTAT) 0.4 MG SL tablet Place 1 tablet (0.4 mg total) under the tongue every 5 (five) minutes as needed for chest pain. 11/18/14   Samuella Cota, MD  omeprazole (PRILOSEC) 20 MG capsule Take 1 capsule (20 mg total) by mouth 2 (two) times daily before a meal. 06/17/13   Radene Gunning, NP  ondansetron (ZOFRAN) 4 MG tablet Take 1 tablet (4 mg total) by mouth every 8 (  eight) hours as needed for nausea or vomiting. 08/15/15   Rexene Alberts, MD  tamsulosin (FLOMAX) 0.4 MG CAPS capsule Take 1 capsule (0.4 mg total) by mouth daily. 09/02/15   Sid Falcon, MD    Physical Exam: Vitals:   06/14/16 2245 06/15/16 0100 06/15/16 0105 06/15/16 0200  BP: (!) 126/58  (!) 152/58 (!) 152/62  Pulse: 66 72  71  Resp: 20 14  19   Temp:      TempSrc:      SpO2: 99% 98%  100%  Weight:          Constitutional: NAD, calm, comfortable Vitals:   06/14/16 2245 06/15/16 0100 06/15/16 0105 06/15/16 0200  BP: (!) 126/58  (!) 152/58 (!) 152/62  Pulse: 66 72  71  Resp: 20 14  19   Temp:      TempSrc:      SpO2: 99% 98%  100%  Weight:       Eyes: PERRL, lids and conjunctivae normal ENMT: Mucous membranes are moist. Posterior pharynx clear of any exudate or lesions.Normal dentition.  Neck: normal, supple, no masses, no thyromegaly Respiratory: clear to auscultation bilaterally, no wheezing, no crackles. Normal respiratory effort. No accessory  muscle use.  Cardiovascular: Regular rate and rhythm, no murmurs / rubs / gallops. No extremity edema. 2+ pedal pulses. No carotid bruits.  Abdomen: no tenderness, no masses palpated. No hepatosplenomegaly. Bowel sounds positive.  Musculoskeletal: no clubbing / cyanosis. Good ROM, no contractures. Normal muscle tone.  Skin: no rashes, lesions, ulcers. No induration Neurologic: CN 2-12 grossly intact. Sensation intact, DTR normal. Strength 5/5 in all 4.  Psychiatric: Normal judgment and insight. Alert and oriented x 3. Normal mood.   Labs on Admission: I have personally reviewed following labs and imaging studies  CBC:  Recent Labs Lab 06/14/16 2254  WBC 9.7  NEUTROABS 6.2  HGB 11.5*  HCT 33.4*  MCV 91.8  PLT 025   Basic Metabolic Panel:  Recent Labs Lab 06/14/16 2254  NA 134*  K 3.2*  CL 96*  CO2 30  GLUCOSE 125*  BUN 41*  CREATININE 2.82*  CALCIUM 7.8*    Recent Labs Lab 06/14/16 2254  LIPASE 20   No results for input(s): AMMONIA in the last 168 hours. Coagulation Profile: No results for input(s): INR, PROTIME in the last 168 hours. Cardiac Enzymes:  Recent Labs Lab 06/14/16 2348  TROPONINI <0.03   Urine analysis:    Component Value Date/Time   COLORURINE YELLOW 06/14/2016 2314   APPEARANCEUR CLOUDY (A) 06/14/2016 2314   LABSPEC 1.009 06/14/2016 2314   PHURINE 7.0 06/14/2016 2314   GLUCOSEU NEGATIVE 06/14/2016 2314   HGBUR SMALL (A) 06/14/2016 2314   BILIRUBINUR NEGATIVE 06/14/2016 [place] 06/14/2016 2314   PROTEINUR 100 (A) 06/14/2016 2314   UROBILINOGEN 4.0 (H) 11/05/2014 1930   NITRITE NEGATIVE 06/14/2016 2314   LEUKOCYTESUR LARGE (A) 06/14/2016 2314    Recent Results (from the past 240 hour(s))  Blood culture (routine x 2)     Status: None (Preliminary result)   Collection Time: 06/14/16 11:27 PM  Result Value Ref Range Status   Specimen Description BLOOD RIGHT ARM  Final   Special Requests   Final    BOTTLES DRAWN  AEROBIC AND ANAEROBIC Blood Culture adequate volume   Culture PENDING  Incomplete   Report Status PENDING  Incomplete  Blood culture (routine x 2)     Status: None (Preliminary result)   Collection Time: 06/14/16 11:54 PM  Result Value Ref Range Status   Specimen Description RIGHT ANTECUBITAL  Final   Special Requests   Final    BOTTLES DRAWN AEROBIC AND ANAEROBIC Blood Culture results may not be optimal due to an inadequate volume of blood received in culture bottles   Culture PENDING  Incomplete   Report Status PENDING  Incomplete     Radiological Exams on Admission: Dg Chest 1 View  Result Date: 06/15/2016 CLINICAL DATA:  Chest pain, dyspnea, and dysphagia since yesterday. EXAM: CHEST 1 VIEW COMPARISON:  09/04/2015 CXR FINDINGS: Low lung volumes with streaky parenchymal scarring and/or atelectasis in the right mid lung and both lower lobes. No pneumonic consolidation. Status post CABG. Aortic atherosclerosis without aneurysm. High-riding humeral heads consistent with chronic rotator cuff tear tear is with remodeled appearance of the left clavicle which can also be seen in rheumatoid arthritis. AC joint osteoarthritis noted bilaterally. IMPRESSION: Bilateral subsegmental atelectasis and/or scarring as above. Status post CABG. Aortic atherosclerosis. No acute cardiopulmonary disease. Electronically Signed   By: Ashley Royalty M.D.   On: 06/15/2016 00:32   Ct Renal Stone Study  Result Date: 06/15/2016 CLINICAL DATA:  Daily LEFT flank pain for several months, more severe tonight. History of end-stage renal disease on dialysis, diabetes, gout. EXAM: CT ABDOMEN AND PELVIS WITHOUT CONTRAST TECHNIQUE: Multidetector CT imaging of the abdomen and pelvis was performed following the standard protocol without IV contrast. COMPARISON:  CT abdomen and pelvis August 27, 2015 FINDINGS: LOWER CHEST: Bibasilar atelectasis, RIGHT pulmonary nodule not included on today's examination. 3 mm sub solid RIGHT lower lobe  pulmonary nodule below size followup recommendations. Heart size is normal, no pericardial effusion. HEPATOBILIARY: Status post cholecystectomy. Hepatic calcified granulomas, liver is otherwise unremarkable. PANCREAS: Normal. SPLEEN: Normal. ADRENALS/URINARY TRACT: Kidneys are orthotopic, demonstrating normal size and morphology. No nephrolithiasis, hydronephrosis; limited assessment for renal masses on this nonenhanced examination. The unopacified ureters are normal in course and caliber. Urinary bladder is partially distended, mild wall thickening without inflammation, unchanged. Normal adrenal glands. STOMACH/BOWEL: The stomach, small and large bowel are normal in course and caliber without inflammatory changes, sensitivity decreased by lack of enteric contrast. Moderate retained large bowel stool. VASCULAR/LYMPHATIC: Aortoiliac vessels are normal in course and caliber, mild calcific atherosclerosis. No lymphadenopathy by CT size criteria. REPRODUCTIVE: Prostate is mildly enlarged. OTHER: No intraperitoneal free fluid or free air. Surgical clips upper abdomen. MUSCULOSKELETAL: Severe RIGHT hip osteoarthrosis and chronic deformity including flattened femoral head with dense effusion. S-type scoliosis. Severe degenerative change of lumbar spine. Moderate to severe neural foraminal narrowing. Osteopenia. Chronic small RIGHT anterior abdominal wall seroma with linear calcifications. Moderate wide necked fat containing supraumbilical ventral hernia. IMPRESSION: No urolithiasis or obstructive uropathy. Moderate retained large bowel stool without bowel obstruction or acute intra-abdominal/ pelvic process. 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 Electronically Signed   By: Elon Alas M.D.   On: 06/15/2016 01:07    EKG: Independently reviewed.   Assessment/Plan Principal Problem:   Lower urinary tract infectious disease Active Problems:   DM type 2 causing vascular disease  ([place])   ESRD on dialysis ([place])   Pyelonephritis   Atypical chest pain    PLAN:   Atypical CP:  Doubt ACS, or even angina as his pain is quite atypical and suspicious for musculoskeletal.  He does have significant cardiac risk, so Ace admit him for OBS and cycling of troponins.  Pyelonephritis:  Culture was done.  His previous culture was Klebsiella sensitve to Rocephin.  Alan [name] continue with it pending C and S.  ESRD on HD:  Fluid is balance, electrolytes are OK.  He is due on Monday for routine dialysis.   HTN:  Alan [name] continue with his meds.  DM:  Alan [name] continue low dose Lantus, and sensitive SSI.  No meal coverages.    DVT prophylaxis:SubQ Heparin.  Code Status: FULL CODE.  Family Communication: None.  Disposition Plan: To home.  Consults called: None.  Admission status: OBS>    Alan Siverson MD FACP. [facility]  If 7PM-7AM, please contact night-coverage [email or web address] Password [facility]  06/15/2016, 2:55 AM

## 2016-06-15 NOTE — Discharge Instructions (Signed)
Follow with Primary [facility] in 7 days   Get CBC, CMP, checked  by Primary MD next visit.    Activity: As tolerated with Full fall precautions use walker/cane & assistance as needed   Disposition Home    Diet: renal diet with fluid restriction , with feeding assistance and aspiration precautions.   On your next visit with your primary care physician please Get Medicines reviewed and adjusted.   Please request your Prim.MD to go over all Hospital Tests and Procedure/Radiological results at the follow up, please get all Hospital records sent to your Prim MD by signing hospital release before you go home.   If you experience worsening of your admission symptoms, develop shortness of breath, life threatening emergency, suicidal or homicidal thoughts you must seek medical attention immediately by calling 911 or calling your MD immediately  if symptoms less severe.  You Must read complete instructions/literature along with all the possible adverse reactions/side effects for all the Medicines you take and that have been prescribed to you. Take any new Medicines after you have completely understood and accpet all the possible adverse reactions/side effects.   Do not drive, operating heavy machinery, perform activities at heights, swimming or participation in water activities or provide baby sitting services if your were admitted for syncope or siezures until you have seen by Primary MD or a Neurologist and advised to do so again.  Do not drive when taking Pain medications.    Do not take more than prescribed Pain, Sleep and Anxiety Medications  Special Instructions: If you have smoked or chewed Tobacco  in the last 2 yrs please stop smoking, stop any regular Alcohol  and or any Recreational drug use.  Wear Seat belts while driving.   Please note  You were cared for by a hospitalist during your hospital stay. If you have any questions about your discharge  medications or the care you received while you were in the hospital after you are discharged, you can call the unit and asked to speak with the hospitalist on call if the hospitalist that took care of you is not available. Once you are discharged, your primary care physician Salim handle any further medical issues. Please note that NO REFILLS for any discharge medications Lionel be authorized once you are discharged, as it is imperative that you return to your primary care physician (or establish a relationship with a primary care physician if you do not have one) for your aftercare needs so that they can reassess your need for medications and monitor your lab values.

## 2016-06-15 NOTE — Progress Notes (Signed)
Initial Nutrition Assessment  DOCUMENTATION CODES:  Not applicable  INTERVENTION:  Glucerna Shake po BID, each supplement provides 220 kcal and 10 grams of protein  NUTRITION DIAGNOSIS:  Increased nutrient needs related to chronic illness (ESRD on HD) as evidenced by estimated nutritional requirements for this therapy  GOAL:  Patient Alan [name] meet greater than or equal to 90% of their needs  MONITOR:  PO intake, Supplement acceptance, Labs, Weight trends  REASON FOR ASSESSMENT:  Malnutrition Screening Tool    ASSESSMENT:  76 y/o male PMHx ESRD on HD, HLD, CVA, GOUT, HTN, DM on insulin, DDD, Bilateral BKA, CAD. Presents with Left flank pain and intermittent chest pain. Admitted for observation.   Pt had reported on MST screen that he had a poor appetite and had lost wt unintentionally.    RD operating remotely. There is no documentation of the pt having a poor appetite in any of the current note. In fact, Per h&P patient had denied weight changes,or any n/v/c/d. Perhaps his flank pain/UTI decreased his appetite.   His weight fluctuates consistent with his dialysis days. He may have lost ~5 lbs since his procedure 3 weeks ago, but currently do not know his dry weight Wt appears to have trended up in the past year.   Pt on a carb modified diet and Pt has eaten 75% of al his meals since admitted. Can add glucerna BID hrs given increased needs for patients undergoing HD  Physical Exam: Unable to assess  Labs: K: 3.2, bg 95-165. No recent phos level or A1C Medications: B complex-vit C, Folate, nephrovite, dulcolax, sensipar, Ensure Enlive, Nepro, Lasix, Insulin, Vit C, ppi,    Recent Labs Lab 06/14/16 2254 06/15/16 0714  NA 134*  --   K 3.2*  --   CL 96*  --   CO2 30  --   BUN 41*  --   CREATININE 2.82* 3.46*  CALCIUM 7.8*  --   GLUCOSE 125*  --    Diet Order:  Diet Carb Modified Fluid consistency: Thin; Room service appropriate? Yes  Skin:  Reviewed, no issues  Last BM:   4/27  Height:  Ht Readings from Last 1 Encounters:  05/28/16 5\' 9"  (1.753 m)   Weight:  Wt Readings from Last 1 Encounters:  06/15/16 161 lb 13.1 oz (73.4 kg)   Wt Readings from Last 10 Encounters:  06/15/16 161 lb 13.1 oz (73.4 kg)  05/28/16 168 lb (76.2 kg)  05/21/16 168 lb (76.2 kg)  02/14/16 156 lb (70.8 kg)  09/02/15 156 lb 1.4 oz (70.8 kg)  08/15/15 153 lb 3.5 oz (69.5 kg)  04/15/15 160 lb 0.9 oz (72.6 kg)  11/18/14 174 lb 2.6 oz (79 kg)  11/08/14 175 lb (79.4 kg)  11/05/14 174 lb 2.6 oz (79 kg)   Ideal Body Weight:  63.27 kg (Adjusted for bilateral BKA)  BMI: Body mass index adjusted for bilateral BKA  is 27.5 kg/m.  Estimated Nutritional Needs:  Kcal:  1850-2050 (25-28 kcal/kg bw) Protein:  75-90g (1.2-1.4 g/kg ibw) Fluid:  < 1.2 L  EDUCATION NEEDS:  No education needs identified at this time  Burtis Junes RD, LDN, [place] [facility] Pager: [phone] 06/15/2016 3:50 PM

## 2016-06-17 LAB — URINE CULTURE

## 2016-06-20 LAB — CULTURE, BLOOD (ROUTINE X 2)
Culture: NO GROWTH
Culture: NO GROWTH
SPECIAL REQUESTS: ADEQUATE

## 2016-07-18 ENCOUNTER — Encounter (INDEPENDENT_AMBULATORY_CARE_PROVIDER_SITE_OTHER): Payer: Medicare Other | Admitting: Vascular Surgery

## 2016-07-18 ENCOUNTER — Encounter (INDEPENDENT_AMBULATORY_CARE_PROVIDER_SITE_OTHER): Payer: Medicare Other

## 2016-07-24 ENCOUNTER — Other Ambulatory Visit: Payer: Self-pay | Admitting: Adult Health

## 2016-08-16 ENCOUNTER — Encounter (INDEPENDENT_AMBULATORY_CARE_PROVIDER_SITE_OTHER): Payer: Self-pay

## 2016-08-19 ENCOUNTER — Other Ambulatory Visit (INDEPENDENT_AMBULATORY_CARE_PROVIDER_SITE_OTHER): Payer: Self-pay | Admitting: Vascular Surgery

## 2016-08-20 ENCOUNTER — Ambulatory Visit: Admit: 2016-08-20 | Payer: Medicare Other | Admitting: Vascular Surgery

## 2016-08-20 SURGERY — A/V FISTULAGRAM
Anesthesia: Moderate Sedation | Laterality: Left

## 2016-08-29 ENCOUNTER — Ambulatory Visit (INDEPENDENT_AMBULATORY_CARE_PROVIDER_SITE_OTHER): Payer: Medicare Other | Admitting: Vascular Surgery

## 2016-08-29 ENCOUNTER — Encounter (INDEPENDENT_AMBULATORY_CARE_PROVIDER_SITE_OTHER): Payer: Medicare Other

## 2016-10-18 ENCOUNTER — Encounter (INDEPENDENT_AMBULATORY_CARE_PROVIDER_SITE_OTHER): Payer: Self-pay

## 2016-10-24 ENCOUNTER — Other Ambulatory Visit (INDEPENDENT_AMBULATORY_CARE_PROVIDER_SITE_OTHER): Payer: Self-pay

## 2016-10-27 MED ORDER — CEFAZOLIN SODIUM-DEXTROSE 1-4 GM/50ML-% IV SOLN
1.0000 g | Freq: Once | INTRAVENOUS | Status: AC
  Administered 2016-10-28: 1 g via INTRAVENOUS

## 2016-10-28 ENCOUNTER — Encounter: Payer: Self-pay | Admitting: *Deleted

## 2016-10-28 ENCOUNTER — Ambulatory Visit
Admission: RE | Admit: 2016-10-28 | Discharge: 2016-10-28 | Disposition: A | Discharge status: Home / Self Care | Payer: Medicare Other | Source: Ambulatory Visit | Attending: Vascular Surgery | Admitting: Vascular Surgery

## 2016-10-28 ENCOUNTER — Encounter
Admission: RE | Disposition: A | Discharge status: Home / Self Care | Payer: Self-pay | Source: Ambulatory Visit | Attending: Vascular Surgery

## 2016-10-28 ENCOUNTER — Encounter: Payer: Self-pay | Admitting: Certified Registered"

## 2016-10-28 DIAGNOSIS — Z9049 Acquired absence of other specified parts of digestive tract: Secondary | ICD-10-CM | POA: Diagnosis not present

## 2016-10-28 DIAGNOSIS — Z885 Allergy status to narcotic agent status: Secondary | ICD-10-CM | POA: Insufficient documentation

## 2016-10-28 DIAGNOSIS — Z89511 Acquired absence of right leg below knee: Secondary | ICD-10-CM | POA: Insufficient documentation

## 2016-10-28 DIAGNOSIS — E0590 Thyrotoxicosis, unspecified without thyrotoxic crisis or storm: Secondary | ICD-10-CM | POA: Diagnosis not present

## 2016-10-28 DIAGNOSIS — Z9889 Other specified postprocedural states: Secondary | ICD-10-CM | POA: Diagnosis not present

## 2016-10-28 DIAGNOSIS — Z87891 Personal history of nicotine dependence: Secondary | ICD-10-CM | POA: Diagnosis not present

## 2016-10-28 DIAGNOSIS — I120 Hypertensive chronic kidney disease with stage 5 chronic kidney disease or end stage renal disease: Secondary | ICD-10-CM | POA: Diagnosis not present

## 2016-10-28 DIAGNOSIS — N186 End stage renal disease: Secondary | ICD-10-CM

## 2016-10-28 DIAGNOSIS — T82858A Stenosis of vascular prosthetic devices, implants and grafts, initial encounter: Secondary | ICD-10-CM | POA: Insufficient documentation

## 2016-10-28 DIAGNOSIS — M5030 Other cervical disc degeneration, unspecified cervical region: Secondary | ICD-10-CM | POA: Insufficient documentation

## 2016-10-28 DIAGNOSIS — Z91030 Bee allergy status: Secondary | ICD-10-CM | POA: Insufficient documentation

## 2016-10-28 DIAGNOSIS — E119 Type 2 diabetes mellitus without complications: Secondary | ICD-10-CM | POA: Diagnosis not present

## 2016-10-28 DIAGNOSIS — Z809 Family history of malignant neoplasm, unspecified: Secondary | ICD-10-CM | POA: Insufficient documentation

## 2016-10-28 DIAGNOSIS — Z951 Presence of aortocoronary bypass graft: Secondary | ICD-10-CM | POA: Diagnosis not present

## 2016-10-28 DIAGNOSIS — E1122 Type 2 diabetes mellitus with diabetic chronic kidney disease: Secondary | ICD-10-CM | POA: Diagnosis not present

## 2016-10-28 DIAGNOSIS — Y832 Surgical operation with anastomosis, bypass or graft as the cause of abnormal reaction of the patient, or of later complication, without mention of misadventure at the time of the procedure: Secondary | ICD-10-CM | POA: Diagnosis not present

## 2016-10-28 DIAGNOSIS — Z8673 Personal history of transient ischemic attack (TIA), and cerebral infarction without residual deficits: Secondary | ICD-10-CM | POA: Diagnosis not present

## 2016-10-28 DIAGNOSIS — M5136 Other intervertebral disc degeneration, lumbar region: Secondary | ICD-10-CM | POA: Diagnosis not present

## 2016-10-28 DIAGNOSIS — I2510 Atherosclerotic heart disease of native coronary artery without angina pectoris: Secondary | ICD-10-CM | POA: Insufficient documentation

## 2016-10-28 DIAGNOSIS — I10 Essential (primary) hypertension: Secondary | ICD-10-CM | POA: Diagnosis not present

## 2016-10-28 DIAGNOSIS — Z8719 Personal history of other diseases of the digestive system: Secondary | ICD-10-CM | POA: Diagnosis not present

## 2016-10-28 DIAGNOSIS — Z992 Dependence on renal dialysis: Secondary | ICD-10-CM | POA: Insufficient documentation

## 2016-10-28 DIAGNOSIS — T82868A Thrombosis of vascular prosthetic devices, implants and grafts, initial encounter: Secondary | ICD-10-CM | POA: Diagnosis not present

## 2016-10-28 HISTORY — PX: A/V FISTULAGRAM: CATH118298

## 2016-10-28 LAB — POTASSIUM (ARMC VASCULAR LAB ONLY): Potassium (ARMC vascular lab): 3.7 (ref 3.5–5.1)

## 2016-10-28 LAB — GLUCOSE, CAPILLARY: Glucose-Capillary: 106 mg/dL — ABNORMAL HIGH (ref 65–99)

## 2016-10-28 SURGERY — A/V FISTULAGRAM
Anesthesia: Moderate Sedation | Laterality: Left

## 2016-10-28 MED ORDER — LIDOCAINE-EPINEPHRINE (PF) 2 %-1:200000 IJ SOLN
INTRAMUSCULAR | Status: AC
  Filled 2016-10-28: qty 20

## 2016-10-28 MED ORDER — FENTANYL CITRATE (PF) 100 MCG/2ML IJ SOLN
INTRAMUSCULAR | Status: AC
Start: 2016-10-28 — End: ?
  Filled 2016-10-28: qty 2

## 2016-10-28 MED ORDER — HEPARIN SODIUM (PORCINE) 1000 UNIT/ML IJ SOLN
INTRAMUSCULAR | Status: AC
  Filled 2016-10-28: qty 1

## 2016-10-28 MED ORDER — CEFAZOLIN SODIUM-DEXTROSE 1-4 GM/50ML-% IV SOLN
INTRAVENOUS | Status: AC
  Filled 2016-10-28: qty 50

## 2016-10-28 MED ORDER — FAMOTIDINE 20 MG PO TABS: 40.0000 mg | ORAL_TABLET | ORAL | Status: DC | PRN

## 2016-10-28 MED ORDER — METHYLPREDNISOLONE SODIUM SUCC 125 MG IJ SOLR: 125.0000 mg | INTRAMUSCULAR | Status: DC | PRN

## 2016-10-28 MED ORDER — FENTANYL CITRATE (PF) 100 MCG/2ML IJ SOLN
INTRAMUSCULAR | Status: DC | PRN
  Administered 2016-10-28: 50 ug via INTRAVENOUS

## 2016-10-28 MED ORDER — IOPAMIDOL (ISOVUE-300) INJECTION 61%
INTRAVENOUS | Status: DC | PRN
  Administered 2016-10-28: 20 mL via INTRAVENOUS

## 2016-10-28 MED ORDER — SODIUM CHLORIDE 0.9 % IV SOLN: INTRAVENOUS | Status: DC

## 2016-10-28 MED ORDER — MIDAZOLAM HCL 5 MG/5ML IJ SOLN
INTRAMUSCULAR | Status: AC
  Filled 2016-10-28: qty 5

## 2016-10-28 MED ORDER — MIDAZOLAM HCL 2 MG/2ML IJ SOLN
INTRAMUSCULAR | Status: DC | PRN
  Administered 2016-10-28: 2 mg via INTRAVENOUS

## 2016-10-28 SURGICAL SUPPLY — 14 items
BALLN LUTONIX AV 8X60X75 (BALLOONS) ×3
BALLN LUTONIX DCB 6X80X130 (BALLOONS) ×3
BALLOON LUTONIX AV 8X60X75 (BALLOONS) ×1 IMPLANT
BALLOON LUTONIX DCB 6X80X130 (BALLOONS) ×1 IMPLANT
CANNULA 5F STIFF (CANNULA) ×3 IMPLANT
CATH BEACON 5 .035 40 KMP TP (CATHETERS) ×1 IMPLANT
CATH BEACON 5 .038 40 KMP TP (CATHETERS) ×2
DEVICE PRESTO INFLATION (MISCELLANEOUS) ×3 IMPLANT
DRAPE BRACHIAL (DRAPES) ×3 IMPLANT
PACK ANGIOGRAPHY (CUSTOM PROCEDURE TRAY) ×3 IMPLANT
SHEATH BRITE TIP 6FRX11 (SHEATH) IMPLANT
SHEATH BRITE TIP 6FRX5.5 (SHEATH) ×3 IMPLANT
SUT MNCRL AB 4-0 PS2 18 (SUTURE) ×3 IMPLANT
WIRE MAGIC TOR.035 180C (WIRE) ×3 IMPLANT

## 2016-10-28 NOTE — H&P (Signed)
[facility] Admission History & Physical  MRN : [id number]  Alan [name] is a 76 y.o. (03-24-1940) male who presents with chief complaint of No chief complaint on file. Marland Kitchen  History of Present Illness: I am asked to evaluate the patient by the dialysis center. The patient was sent here because they were unable to achieve adequate dialysis this morning. Furthermore the Center states there is very poor thrill and bruit. The patient states there there have been increasing problems with the access, such as "pulling clots" during dialysis and prolonged bleeding after decannulation. The patient estimates these problems have been going on for several weeks. The patient is unaware of any other change.  Patient denies pain or tenderness overlying the access.  There is no pain with dialysis.  The patient denies hand pain or finger pain consistent with steal syndrome.   There have been previous past interventions or declots of this access.  The patient is not chronically hypotensive on dialysis.  Current Facility-Administered Medications  Medication Dose Route Frequency Provider Last Rate Last Dose  . 0.9 %  sodium chloride infusion   Intravenous Continuous Stegmayer, Kimberly A, PA-C      . ceFAZolin (ANCEF) 1-4 GM/50ML-% IVPB           . ceFAZolin (ANCEF) IVPB 1 g/50 mL premix  1 g Intravenous Once Stegmayer, Kimberly A, PA-C      . famotidine (PEPCID) tablet 40 mg  40 mg Oral PRN Stegmayer, Kimberly A, PA-C      . methylPREDNISolone sodium succinate (SOLU-MEDROL) 125 mg/2 mL injection 125 mg  125 mg Intravenous PRN Stegmayer, Janalyn Harder, PA-C        Past Medical History:  Diagnosis Date  . Arthritis   . Asthma   . Clotted renal dialysis arteriovenous graft ([place])   . Coronary artery disease 2000   Stents [facility]  . CVA (cerebral infarction)   . DDD (degenerative disc disease), cervical   . DDD (degenerative disc disease), lumbar   . Diabetes mellitus   . ESRD (end stage  renal disease) on dialysis ([place])   . Gangrene of toe ([place]) Feb. 2015   Right  great    . Gastric ulcer    EGD 2014  . Gout   . Hypercholesteremia   . Hypertension   . Stroke ([place])   . 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 Thrombosis of renal dialysis arteriovenous graft [facility])     Past Surgical History:  Procedure Laterality Date  . A/V FISTULAGRAM Left 05/28/2016   Procedure: A/V Fistulagram;  Surgeon: Katha Cabal, MD;  Location: [place] [facility];  Service: Cardiovascular;  Laterality: Left;  . A/V SHUNT INTERVENTION N/A 05/28/2016   Procedure: A/V Shunt Intervention;  Surgeon: Katha Cabal, MD;  Location: [facility];  Service: Cardiovascular;  Laterality: N/A;  . AMPUTATION Right 04/10/2013   Procedure: Right Below Knee Amputation;  Surgeon: Newt Minion, MD;  Location: [facility];  Service: Orthopedics;  Laterality: Right;  Right Below Knee Amputation  . APPENDECTOMY    . BELOW KNEE LEG AMPUTATION Bilateral   . CHOLECYSTECTOMY    . CORONARY ARTERY BYPASS GRAFT  1996   [place]  . ESOPHAGOGASTRODUODENOSCOPY Left 05/24/2012   IDP:OEUMPNTI dilated baggy but otherwise a normal/ Small hiatal hernia. Gastric ulcer -S/P biopsy  . PERIPHERAL VASCULAR CATHETERIZATION N/A 08/09/2014   Procedure: A/V Shuntogram/Fistulagram;  Surgeon: Katha Cabal, MD;  Location: [place] [facility];  Service: Cardiovascular;  Laterality:  N/A;  . PERIPHERAL VASCULAR CATHETERIZATION Left 08/09/2014   Procedure: A/V Shunt Intervention;  Surgeon: Katha Cabal, MD;  Location: [facility];  Service: Cardiovascular;  Laterality: Left;    Social History Social History  Substance Use Topics  . Smoking status: Former Smoker    Quit date: 08/08/2004  . Smokeless tobacco: Never Used  . Alcohol use No    Family History Family History  Problem Relation Age of Onset  . Cancer Mother   . Colon cancer Neg Hx     No family history of bleeding or clotting disorders, autoimmune  disease or porphyria  Allergies  Allergen Reactions  . Codeine Itching and Nausea And Vomiting  . [place] Venom [Bee Venom] Swelling     REVIEW OF SYSTEMS (Negative unless checked)  Constitutional: [] Weight loss  [] Fever  [] Chills Cardiac: [] Chest pain   [] Chest pressure   [x] Palpitations   [] Shortness of breath when laying flat   [] Shortness of breath at rest   [x] Shortness of breath with exertion. Vascular:  [] Pain in legs with walking   [] Pain in legs at rest   [] Pain in legs when laying flat   [] Claudication   [] Pain in feet when walking  [] Pain in feet at rest  [] Pain in feet when laying flat   [] History of DVT   [] Phlebitis   [x] Swelling in legs   [] Varicose veins   [] Non-healing ulcers Pulmonary:   [] Uses home oxygen   [] Productive cough   [] Hemoptysis   [] Wheeze  [] COPD   [] Asthma Neurologic:  [] Dizziness  [] Blackouts   [] Seizures   [x] History of stroke   [] History of TIA  [] Aphasia   [] Temporary blindness   [] Dysphagia   [] Weakness or numbness in arms   [] Weakness or numbness in legs Musculoskeletal:  [] Arthritis   [] Joint swelling   [] Joint pain   [] Low back pain Hematologic:  [] Easy bruising  [] Easy bleeding   [] Hypercoagulable state   [] Anemic  [] Hepatitis Gastrointestinal:  [] Blood in stool   [] Vomiting blood  [] Gastroesophageal reflux/heartburn   [] Difficulty swallowing. Genitourinary:  [x] Chronic kidney disease   [] Difficult urination  [] Frequent urination  [] Burning with urination   [] Blood in urine Skin:  [] Rashes   [] Ulcers   [] Wounds Psychological:  [] History of anxiety   []  History of major depression.  Physical Examination  Vitals:   10/28/16 0829  BP: (!) 165/72  Pulse: 68  Resp: 15  Temp: 98.1 F (36.7 C)  TempSrc: Oral  SpO2: 97%  Weight: 73 kg (161 lb)  Height: 5\' 9"  (1.753 m)   Body mass index is 23.78 kg/m. Gen: WD/WN, NAD Head: [place]/AT, No temporalis wasting. Ear/Nose/Throat: Hearing grossly intact, nares w/o erythema or drainage, oropharynx  w/o Erythema/Exudate,  Eyes: Conjunctiva clear, sclera non-icteric Neck: Trachea midline.  No JVD.  Pulmonary:  Good air movement, respirations not labored, no use of accessory muscles.  Cardiac: RRR, normal S1, S2. Vascular: pulsatile thrill in left arm access Vessel Right Left  Radial Palpable Palpable  Ulnar Not Palpable Not Palpable  Brachial Palpable Palpable  Carotid Palpable, without bruit Palpable, without bruit   Musculoskeletal: M/S 5/5 throughout.  Extremities without ischemic changes.  No deformity or atrophy.  Neurologic: Sensation grossly intact in extremities.  Symmetrical.  Speech is fluent. Motor exam as listed above. Psychiatric: Judgment intact, Mood & affect appropriate for pt's clinical situation. Dermatologic: No rashes or ulcers noted.  No cellulitis or open wounds.    CBC Lab Results  Component Value Date  WBC 8.4 06/15/2016   HGB 11.4 (L) 06/15/2016   HCT 33.6 (L) 06/15/2016   MCV 91.6 06/15/2016   PLT 181 06/15/2016    BMET    Component Value Date/Time   NA 134 (L) 06/14/2016 2254   NA 136 09/03/2012 0934   K 3.2 (L) 06/14/2016 2254   K 3.9 09/03/2012 0934   CL 96 (L) 06/14/2016 2254   CL 100 09/03/2012 0934   CO2 30 06/14/2016 2254   CO2 30 09/03/2012 0934   GLUCOSE 125 (H) 06/14/2016 2254   GLUCOSE 110 (H) 09/03/2012 0934   BUN 41 (H) 06/14/2016 2254   BUN 46 (H) 09/03/2012 0934   CREATININE 3.46 (H) 06/15/2016 0714   CREATININE 6.12 (H) 09/03/2012 0934   CALCIUM 7.8 (L) 06/14/2016 2254   CALCIUM 9.4 09/03/2012 0934   CALCIUM 7.7 (L) 05/20/2012 1530   GFRNONAA 16 (L) 06/15/2016 0714   GFRNONAA 8 (L) 09/03/2012 0934   GFRAA 18 (L) 06/15/2016 0714   GFRAA 10 (L) 09/03/2012 0934   CrCl cannot be calculated (Patient's most recent lab result is older than the maximum 21 days allowed.).  COAG Lab Results  Component Value Date   INR 1.07 08/27/2015   INR 1.15 04/09/2013   INR 1.11 05/22/2012    Radiology No results  found.  Assessment/Plan 1.  Complication dialysis device with thrombosis AV access:  Patient's left arm dialysis access is malfunctioning. The patient Kadarious undergo angiography and correction of any problems using interventional techniques with the hope of restoring function to the access.  The risks and benefits were described to the patient.  All questions were answered.  The patient agrees to proceed with angiography and intervention. Potassium [place] be drawn to ensure that it is an appropriate level prior to performing intervention. 2.  End-stage renal disease requiring hemodialysis:  Patient Manuelito continue dialysis therapy without further interruption if a successful intervention is not achieved then a tunneled catheter Erven be placed. Dialysis has already been arranged. 3.  Hypertension:  Patient Angas continue medical management; nephrology is following no changes in oral medications. 4. Diabetes mellitus:  Glucose [place]ris be monitored and oral medications been held this morning once the patient has undergone the patient's procedure po intake Roddrick be reinitiated and again Accu-Cheks [place] be used to assess the blood glucose level and treat as needed. The patient Mohanad be restarted on the patient's usual hypoglycemic regime     Leotis Pain, MD  10/28/2016 9:12 AM

## 2016-10-28 NOTE — Op Note (Signed)
[facility]    OPERATIVE NOTE   PROCEDURE: 1.   Left radiocephalic arteriovenous fistula cannulation under ultrasound guidance 2.   Left arm fistulagram including central venogram 3.   Percutaneous transluminal angioplasty of mid forearm cephalic vein with 6 mm diameter drug-coated an 8 mm diameter conventional angioplasty balloons  PRE-OPERATIVE DIAGNOSIS: 1. ESRD 2. Poorly functional left radiocephalic AVF  POST-OPERATIVE DIAGNOSIS: same as above   SURGEON: Leotis Pain, MD  ANESTHESIA: local with MCS  ESTIMATED BLOOD LOSS: minimal  FINDING(S): 1. Stenosis about 4-5 cm beyond the anastomosis in the cephalic vein in the mid-forearm measuring over 80%. There was then dual outflow in the upper arm through both the basilic vein and the cephalic vein without stenosis. The central venous circulation was then patent. There was no stenosis at the anastomosis itself.  SPECIMEN(S):  None  CONTRAST: 20 cc  FLUORO TIME: 1.8 minutes  MODERATE CONSCIOUS SEDATION TIME: Approximately 20 minutes with 2 mg of Versed and 50 mcg of Fentanyl   INDICATIONS: Alan [name] is a 76 y.o. male who presents with malfunctioning left radiocephalic arteriovenous fistula.  The patient is scheduled for left arm fistulagram.  The patient is aware the risks include but are not limited to: bleeding, infection, thrombosis of the cannulated access, and possible anaphylactic reaction to the contrast.  The patient is aware of the risks of the procedure and elects to proceed forward.  DESCRIPTION: After full informed written consent was obtained, the patient was brought back to the angiography suite and placed supine upon the angiography table.  The patient was connected to monitoring equipment. Moderate conscious sedation was administered with a face to face encounter with the patient throughout the procedure with my supervision of the RN administering medicines and monitoring the patient's vital  signs and mental status throughout from the start of the procedure until the patient was taken to the recovery room. The left arm was prepped and draped in the standard fashion for a percutaneous access intervention.  Under ultrasound guidance, the left radiocephalic arteriovenous fistula was cannulated with a micropuncture needle under direct ultrasound guidance and a permanent image was performed.  The microwire was advanced into the fistula and the needle was exchanged for the a microsheath.  I then upsized to a 6 Fr Sheath and imaging was performed.  Hand injections were completed to image the access including the central venous system. This demonstrated stenosis about 4-5 cm beyond the anastomosis in the cephalic vein in the mid-forearm measuring over 80%. There was then dual outflow in the upper arm through both the basilic vein and the cephalic vein without stenosis. The central venous circulation was then patent. There was no stenosis at the anastomosis itself.  Based on the images, this patient Daevon need intervention to the mid forearm cephalic vein stenosis. I then gave the patient 3000 units of intravenous heparin.  I then crossed the stenosis with a Magic Tourqe wire.  Based on the imaging, a 6 mm x 8 cm  Lutonix drug coated angioplasty balloon was selected.  The balloon was centered around the mid forearm cephalic vein stenosis and inflated to 12 ATM for 1 minute(s).  On completion imaging, a 50% residual stenosis was present and the 6 mm balloon seemed slightly undersized. I then upsized to an 8 mm diameter by 6 cm length conventional angioplasty balloon and inflated this to 10 atm for 1 minute. On completion imaging only about a 15-20% residual stenosis was present.  Based on the completion imaging, no further intervention is necessary.  The wire and balloon were removed from the sheath.  A 4-0 Monocryl purse-string suture was sewn around the sheath.  The sheath was removed while tying down  the suture.  A sterile bandage was applied to the puncture site.  COMPLICATIONS: None  CONDITION: Stable   Leotis Pain  10/28/2016 10:04 AM   This note was created with Dragon Medical transcription system. Any errors in dictation are purely unintentional.

## 2016-12-03 ENCOUNTER — Encounter (INDEPENDENT_AMBULATORY_CARE_PROVIDER_SITE_OTHER): Payer: Medicare Other

## 2016-12-03 ENCOUNTER — Ambulatory Visit (INDEPENDENT_AMBULATORY_CARE_PROVIDER_SITE_OTHER): Payer: Medicare Other | Admitting: Vascular Surgery

## 2017-01-01 ENCOUNTER — Ambulatory Visit (INDEPENDENT_AMBULATORY_CARE_PROVIDER_SITE_OTHER): Payer: Medicare Other | Admitting: Vascular Surgery

## 2017-01-01 ENCOUNTER — Encounter (INDEPENDENT_AMBULATORY_CARE_PROVIDER_SITE_OTHER): Payer: Medicare Other

## 2017-02-07 ENCOUNTER — Other Ambulatory Visit (INDEPENDENT_AMBULATORY_CARE_PROVIDER_SITE_OTHER): Payer: Self-pay | Admitting: Vascular Surgery

## 2017-02-07 DIAGNOSIS — N185 Chronic kidney disease, stage 5: Secondary | ICD-10-CM

## 2017-02-10 ENCOUNTER — Emergency Department (HOSPITAL_COMMUNITY): Payer: Medicare Other

## 2017-02-10 ENCOUNTER — Encounter (HOSPITAL_COMMUNITY): Payer: Self-pay | Admitting: *Deleted

## 2017-02-10 ENCOUNTER — Inpatient Hospital Stay (HOSPITAL_COMMUNITY)
Admission: EM | Admit: 2017-02-10 | Discharge: 2017-02-13 | DRG: 286 | Disposition: A | Discharge status: Home / Self Care | Payer: Medicare Other | Attending: Internal Medicine | Admitting: Internal Medicine

## 2017-02-10 DIAGNOSIS — Z992 Dependence on renal dialysis: Secondary | ICD-10-CM

## 2017-02-10 DIAGNOSIS — I120 Hypertensive chronic kidney disease with stage 5 chronic kidney disease or end stage renal disease: Secondary | ICD-10-CM | POA: Diagnosis present

## 2017-02-10 DIAGNOSIS — Z8673 Personal history of transient ischemic attack (TIA), and cerebral infarction without residual deficits: Secondary | ICD-10-CM

## 2017-02-10 DIAGNOSIS — E1159 Type 2 diabetes mellitus with other circulatory complications: Secondary | ICD-10-CM | POA: Diagnosis present

## 2017-02-10 DIAGNOSIS — I10 Essential (primary) hypertension: Secondary | ICD-10-CM | POA: Diagnosis not present

## 2017-02-10 DIAGNOSIS — Z794 Long term (current) use of insulin: Secondary | ICD-10-CM | POA: Diagnosis not present

## 2017-02-10 DIAGNOSIS — R748 Abnormal levels of other serum enzymes: Secondary | ICD-10-CM | POA: Diagnosis present

## 2017-02-10 DIAGNOSIS — I2510 Atherosclerotic heart disease of native coronary artery without angina pectoris: Secondary | ICD-10-CM | POA: Diagnosis present

## 2017-02-10 DIAGNOSIS — E1151 Type 2 diabetes mellitus with diabetic peripheral angiopathy without gangrene: Secondary | ICD-10-CM | POA: Diagnosis present

## 2017-02-10 DIAGNOSIS — E785 Hyperlipidemia, unspecified: Secondary | ICD-10-CM | POA: Diagnosis present

## 2017-02-10 DIAGNOSIS — E7800 Pure hypercholesterolemia, unspecified: Secondary | ICD-10-CM | POA: Diagnosis not present

## 2017-02-10 DIAGNOSIS — N186 End stage renal disease: Secondary | ICD-10-CM

## 2017-02-10 DIAGNOSIS — M109 Gout, unspecified: Secondary | ICD-10-CM | POA: Diagnosis not present

## 2017-02-10 DIAGNOSIS — Z23 Encounter for immunization: Secondary | ICD-10-CM

## 2017-02-10 DIAGNOSIS — E876 Hypokalemia: Secondary | ICD-10-CM | POA: Diagnosis present

## 2017-02-10 DIAGNOSIS — Z9049 Acquired absence of other specified parts of digestive tract: Secondary | ICD-10-CM | POA: Diagnosis not present

## 2017-02-10 DIAGNOSIS — Z91030 Bee allergy status: Secondary | ICD-10-CM

## 2017-02-10 DIAGNOSIS — I160 Hypertensive urgency: Secondary | ICD-10-CM | POA: Diagnosis present

## 2017-02-10 DIAGNOSIS — R079 Chest pain, unspecified: Secondary | ICD-10-CM | POA: Diagnosis not present

## 2017-02-10 DIAGNOSIS — Z993 Dependence on wheelchair: Secondary | ICD-10-CM

## 2017-02-10 DIAGNOSIS — I208 Other forms of angina pectoris: Secondary | ICD-10-CM | POA: Diagnosis not present

## 2017-02-10 DIAGNOSIS — Z89512 Acquired absence of left leg below knee: Secondary | ICD-10-CM | POA: Diagnosis not present

## 2017-02-10 DIAGNOSIS — I25110 Atherosclerotic heart disease of native coronary artery with unstable angina pectoris: Principal | ICD-10-CM | POA: Diagnosis present

## 2017-02-10 DIAGNOSIS — I252 Old myocardial infarction: Secondary | ICD-10-CM

## 2017-02-10 DIAGNOSIS — Z7982 Long term (current) use of aspirin: Secondary | ICD-10-CM

## 2017-02-10 DIAGNOSIS — E1122 Type 2 diabetes mellitus with diabetic chronic kidney disease: Secondary | ICD-10-CM | POA: Diagnosis present

## 2017-02-10 DIAGNOSIS — Z955 Presence of coronary angioplasty implant and graft: Secondary | ICD-10-CM

## 2017-02-10 DIAGNOSIS — I200 Unstable angina: Secondary | ICD-10-CM | POA: Diagnosis not present

## 2017-02-10 DIAGNOSIS — Z89511 Acquired absence of right leg below knee: Secondary | ICD-10-CM | POA: Diagnosis not present

## 2017-02-10 DIAGNOSIS — Z951 Presence of aortocoronary bypass graft: Secondary | ICD-10-CM | POA: Diagnosis not present

## 2017-02-10 DIAGNOSIS — D631 Anemia in chronic kidney disease: Secondary | ICD-10-CM | POA: Diagnosis present

## 2017-02-10 DIAGNOSIS — R200 Anesthesia of skin: Secondary | ICD-10-CM

## 2017-02-10 DIAGNOSIS — Z885 Allergy status to narcotic agent status: Secondary | ICD-10-CM

## 2017-02-10 DIAGNOSIS — Z79899 Other long term (current) drug therapy: Secondary | ICD-10-CM

## 2017-02-10 DIAGNOSIS — I25700 Atherosclerosis of coronary artery bypass graft(s), unspecified, with unstable angina pectoris: Secondary | ICD-10-CM | POA: Diagnosis not present

## 2017-02-10 DIAGNOSIS — Z87891 Personal history of nicotine dependence: Secondary | ICD-10-CM

## 2017-02-10 LAB — CBC WITH DIFFERENTIAL/PLATELET
Basophils Absolute: 0 10*3/uL (ref 0.0–0.1)
Basophils Relative: 0 %
EOS ABS: 0.3 10*3/uL (ref 0.0–0.7)
Eosinophils Relative: 3 %
HEMATOCRIT: 34.1 % — AB (ref 39.0–52.0)
HEMOGLOBIN: 11.2 g/dL — AB (ref 13.0–17.0)
LYMPHS ABS: 3 10*3/uL (ref 0.7–4.0)
Lymphocytes Relative: 37 %
MCH: 30.6 pg (ref 26.0–34.0)
MCHC: 32.8 g/dL (ref 30.0–36.0)
MCV: 93.2 fL (ref 78.0–100.0)
MONOS PCT: 7 %
Monocytes Absolute: 0.5 10*3/uL (ref 0.1–1.0)
NEUTROS PCT: 53 %
Neutro Abs: 4.3 10*3/uL (ref 1.7–7.7)
Platelets: 209 10*3/uL (ref 150–400)
RBC: 3.66 MIL/uL — ABNORMAL LOW (ref 4.22–5.81)
RDW: 13.4 % (ref 11.5–15.5)
WBC: 8.1 10*3/uL (ref 4.0–10.5)

## 2017-02-10 LAB — BASIC METABOLIC PANEL
Anion gap: 14 (ref 5–15)
BUN: 37 mg/dL — AB (ref 6–20)
CHLORIDE: 96 mmol/L — AB (ref 101–111)
CO2: 26 mmol/L (ref 22–32)
CREATININE: 3.48 mg/dL — AB (ref 0.61–1.24)
Calcium: 8.1 mg/dL — ABNORMAL LOW (ref 8.9–10.3)
GFR calc Af Amer: 18 mL/min — ABNORMAL LOW (ref >60)
GFR calc non Af Amer: 15 mL/min — ABNORMAL LOW (ref >60)
Glucose, Bld: 245 mg/dL — ABNORMAL HIGH (ref 65–99)
Potassium: 2.9 mmol/L — ABNORMAL LOW (ref 3.5–5.1)
SODIUM: 136 mmol/L (ref 135–145)

## 2017-02-10 LAB — TROPONIN I
Troponin I: 0.03 ng/mL (ref <0.03)
Troponin I: 0.03 ng/mL (ref <0.03)

## 2017-02-10 LAB — CBG MONITORING, ED: GLUCOSE-CAPILLARY: 152 mg/dL — AB (ref 65–99)

## 2017-02-10 LAB — MAGNESIUM: MAGNESIUM: 1.6 mg/dL — AB (ref 1.7–2.4)

## 2017-02-10 LAB — PROTIME-INR
INR: 0.99
Prothrombin Time: 13 seconds (ref 11.4–15.2)

## 2017-02-10 MED ORDER — ONDANSETRON HCL 4 MG/2ML IJ SOLN: 4.0000 mg | Freq: Four times a day (QID) | INTRAMUSCULAR | Status: DC | PRN

## 2017-02-10 MED ORDER — HEPARIN SODIUM (PORCINE) 5000 UNIT/ML IJ SOLN
5000.0000 [IU] | Freq: Three times a day (TID) | INTRAMUSCULAR | Status: DC
  Administered 2017-02-10 – 2017-02-13 (×8): 5000 [IU] via SUBCUTANEOUS
  Filled 2017-02-10 (×7): qty 1

## 2017-02-10 MED ORDER — COQ10 100 MG PO CAPS: 100.0000 mg | ORAL_CAPSULE | Freq: Every day | ORAL | Status: DC

## 2017-02-10 MED ORDER — CINACALCET HCL 30 MG PO TABS
30.0000 mg | ORAL_TABLET | Freq: Every day | ORAL | Status: DC
  Administered 2017-02-11 – 2017-02-12 (×3): 30 mg via ORAL
  Filled 2017-02-10 (×4): qty 1

## 2017-02-10 MED ORDER — MORPHINE SULFATE (PF) 2 MG/ML IV SOLN
1.0000 mg | INTRAVENOUS | Status: DC | PRN
  Administered 2017-02-11 (×2): 2 mg via INTRAVENOUS
  Filled 2017-02-10 (×2): qty 1

## 2017-02-10 MED ORDER — ASPIRIN 81 MG PO CHEW
324.0000 mg | CHEWABLE_TABLET | ORAL | Status: AC
  Administered 2017-02-10: 324 mg via ORAL
  Filled 2017-02-10: qty 4

## 2017-02-10 MED ORDER — STROKE: EARLY STAGES OF RECOVERY BOOK
Freq: Once | Status: DC
  Filled 2017-02-10: qty 1

## 2017-02-10 MED ORDER — ACETAMINOPHEN 325 MG PO TABS
650.0000 mg | ORAL_TABLET | ORAL | Status: DC | PRN
  Administered 2017-02-11 – 2017-02-12 (×2): 650 mg via ORAL
  Filled 2017-02-10 (×2): qty 2

## 2017-02-10 MED ORDER — ASPIRIN 300 MG RE SUPP: 300.0000 mg | RECTAL | Status: AC

## 2017-02-10 MED ORDER — NITROGLYCERIN 0.4 MG SL SUBL
0.4000 mg | SUBLINGUAL_TABLET | SUBLINGUAL | Status: DC | PRN
  Administered 2017-02-11: 0.4 mg via SUBLINGUAL
  Filled 2017-02-10: qty 1

## 2017-02-10 MED ORDER — DIPHENHYDRAMINE HCL 25 MG PO CAPS
25.0000 mg | ORAL_CAPSULE | Freq: Every day | ORAL | Status: DC
  Administered 2017-02-11 – 2017-02-12 (×2): 25 mg via ORAL
  Filled 2017-02-10 (×3): qty 1

## 2017-02-10 MED ORDER — MAGNESIUM SULFATE 2 GM/50ML IV SOLN
2.0000 g | Freq: Once | INTRAVENOUS | Status: AC
  Administered 2017-02-10: 2 g via INTRAVENOUS
  Filled 2017-02-10: qty 50

## 2017-02-10 MED ORDER — INSULIN ASPART 100 UNIT/ML ~~LOC~~ SOLN: 0.0000 [IU] | Freq: Every day | SUBCUTANEOUS | Status: DC

## 2017-02-10 MED ORDER — INSULIN GLARGINE 100 UNIT/ML ~~LOC~~ SOLN
8.0000 [IU] | Freq: Every day | SUBCUTANEOUS | Status: DC
  Administered 2017-02-10 – 2017-02-12 (×3): 8 [IU] via SUBCUTANEOUS
  Filled 2017-02-10 (×5): qty 0.08

## 2017-02-10 MED ORDER — INSULIN ASPART 100 UNIT/ML ~~LOC~~ SOLN: 0.0000 [IU] | Freq: Three times a day (TID) | SUBCUTANEOUS | Status: DC

## 2017-02-10 MED ORDER — POTASSIUM CHLORIDE CRYS ER 20 MEQ PO TBCR
20.0000 meq | EXTENDED_RELEASE_TABLET | Freq: Once | ORAL | Status: AC
  Administered 2017-02-10: 20 meq via ORAL
  Filled 2017-02-10: qty 1

## 2017-02-10 MED ORDER — ALBUTEROL SULFATE (2.5 MG/3ML) 0.083% IN NEBU: 3.0000 mL | INHALATION_SOLUTION | RESPIRATORY_TRACT | Status: DC | PRN

## 2017-02-10 MED ORDER — RENA-VITE PO TABS
1.0000 | ORAL_TABLET | Freq: Every evening | ORAL | Status: DC
  Administered 2017-02-11 – 2017-02-12 (×2): 1 via ORAL
  Filled 2017-02-10 (×2): qty 1

## 2017-02-10 MED ORDER — NITROGLYCERIN 0.4 MG SL SUBL
0.4000 mg | SUBLINGUAL_TABLET | Freq: Once | SUBLINGUAL | Status: AC
  Administered 2017-02-10: 0.4 mg via SUBLINGUAL
  Filled 2017-02-10: qty 1

## 2017-02-10 MED ORDER — PANTOPRAZOLE SODIUM 40 MG PO TBEC
40.0000 mg | DELAYED_RELEASE_TABLET | Freq: Every day | ORAL | Status: DC
  Administered 2017-02-11 – 2017-02-13 (×3): 40 mg via ORAL
  Filled 2017-02-10 (×3): qty 1

## 2017-02-10 MED ORDER — ASPIRIN EC 81 MG PO TBEC
81.0000 mg | DELAYED_RELEASE_TABLET | Freq: Every day | ORAL | Status: DC
  Administered 2017-02-11 – 2017-02-12 (×2): 81 mg via ORAL
  Filled 2017-02-10 (×2): qty 1

## 2017-02-10 MED ORDER — METOPROLOL TARTRATE 25 MG PO TABS
25.0000 mg | ORAL_TABLET | Freq: Two times a day (BID) | ORAL | Status: DC
Start: 2017-02-10 — End: 2017-02-13
  Administered 2017-02-10 – 2017-02-12 (×5): 25 mg via ORAL
  Filled 2017-02-10 (×6): qty 1

## 2017-02-10 NOTE — ED Triage Notes (Signed)
Pt with left sided numbness since last night when he went to bed around 1900 and cp this morning.  Hx of cva per family member

## 2017-02-10 NOTE — Progress Notes (Signed)

## 2017-02-10 NOTE — ED Notes (Signed)
CRITICAL VALUE ALERT  Critical Value:  Trop 0.03  Date & Time Notied:  1652, 02/10/17  Provider Notified: Dr. Alvino Chapel  Orders Received/Actions taken: No new orders at this time.

## 2017-02-10 NOTE — H&P (Signed)
History and Physical    Alan [name] ZOX:[id number] DOB: 1940-06-02 DOA: 02/10/2017  PCP: [facility]   Patient coming from: Home  Chief Complaint: Left facial numbness, chest pain   HPI: Alan [name] is a 76 y.o. male with medical history significant for CAD status post remote CABG, end-stage renal disease, history of CVA, hypertension, and insulin-dependent diabetes mellitus, now presenting to the emergency department for evaluation of chest pain and left-sided facial numbness.  Patient reports that he was in his usual state of health until development of dull pain in the left side of his chest approximately 2 days ago.  Pain is nonradiating, waxing and severe at times, associated with mild nausea, but no shortness of breath or diaphoresis.  No significant dyspnea or cough, and no fever or chills.  Patient also reports development of numbness involving most of his left face, particularly the jaw and lips.  Numbness has been constant and unchanged.  Denies any other focal weakness or numbness and there is no acute change in his vision or hearing.  ED Course: Upon arrival to the ED, patient is found to be afebrile, saturating well on room air, hypertensive to 213/73, and is otherwise stable.  EKG features a sinus rhythm and chest x-ray is negative for acute cardiopulmonary disease.  Contrast head CT is notable for mild chronic ischemic white matter disease and possible focal intraparenchymal hemorrhage or contusion at the superior left parietal cortex.  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  Chemistry panel is notable for a potassium of 2.9 and CBC features a chronic stable normocytic anemia with hemoglobin of 11.7.  Troponin is at the borderline of elevation at 0.03.  He was treated with nitroglycerin in the emergency department but continues to  complain of chest pain CAD and left-sided facial numbness and Maxx be admitted to the stepdown unit to [facility] for ongoing evaluation chest pain in a patient with CAD and elevated troponin, as well as left-sided facial numbness with no definite acute finding on the MRI brain.   Review of Systems:  All other systems reviewed and apart from HPI, are negative.  Past Medical History:  Diagnosis Date  . Arthritis   . Asthma   . Clotted renal dialysis arteriovenous graft ([place])   . Coronary artery disease 2000   Stents [facility]  . CVA (cerebral infarction)   . DDD (degenerative disc disease), cervical   . DDD (degenerative disc disease), lumbar   . Diabetes mellitus   . ESRD (end stage renal disease) on dialysis ([place])   . Gangrene of toe ([place]) Feb. 2015   Right  great    . Gastric ulcer    EGD 2014  . Gout   . Hypercholesteremia   . Hypertension   . Stroke ([place])   . 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 Thrombosis of renal dialysis arteriovenous graft [facility])     Past Surgical History:  Procedure Laterality Date  . A/V FISTULAGRAM Left 05/28/2016   Procedure: A/V Fistulagram;  Surgeon: Katha Cabal, MD;  Location: [place] CV LAB;  Service: Cardiovascular;  Laterality: Left;  . A/V FISTULAGRAM Left 10/28/2016   Procedure: A/V Fistulagram;  Surgeon: Algernon Huxley, MD;  Location: [place] [facility];  Service: Cardiovascular;  Laterality: Left;  . A/V SHUNT INTERVENTION N/A 05/28/2016   Procedure: A/V Shunt Intervention;  Surgeon:  Katha Cabal, MD;  Location: [place] [facility];  Service: Cardiovascular;  Laterality: N/A;  . AMPUTATION Right 04/10/2013   Procedure: Right Below Knee Amputation;  Surgeon: Newt Minion, MD;  Location: [place];  Service: Orthopedics;  Laterality: Right;  Right Below Knee Amputation  . APPENDECTOMY    . BELOW KNEE LEG AMPUTATION Bilateral   . CHOLECYSTECTOMY    . CORONARY ARTERY BYPASS GRAFT  1996   [place]  .  ESOPHAGOGASTRODUODENOSCOPY Left 05/24/2012   FUX:NATFTDDU dilated baggy but otherwise a normal/ Small hiatal hernia. Gastric ulcer -S/P biopsy  . PERIPHERAL VASCULAR CATHETERIZATION N/A 08/09/2014   Procedure: A/V Shuntogram/Fistulagram;  Surgeon: Katha Cabal, MD;  Location: [place] CV [facility];  Service: Cardiovascular;  Laterality: N/A;  . PERIPHERAL VASCULAR CATHETERIZATION Left 08/09/2014   Procedure: A/V Shunt Intervention;  Surgeon: Katha Cabal, MD;  Location: [place] [facility];  Service: Cardiovascular;  Laterality: Left;     reports that he quit smoking about 12 years ago. he has never used smokeless tobacco. He reports that he does not drink alcohol or use drugs.  Allergies  Allergen Reactions  . Codeine Itching and Nausea And Vomiting  . [place] Venom [Bee Venom] Swelling    Family History  Problem Relation Age of Onset  . Cancer Mother   . Colon cancer Neg Hx      Prior to Admission medications   Medication Sig Start Date End Date Taking? Authorizing Provider  acetaminophen (TYLENOL) 500 MG tablet Take 1,000 mg by mouth every 8 (eight) hours as needed for mild pain or moderate pain.    Yes [provider]  albuterol (PROVENTIL HFA;VENTOLIN HFA) 108 (90 Base) MCG/ACT inhaler Inhale 2 puffs into the lungs every 4 (four) hours as needed for wheezing or shortness of breath.   Yes [provider]  Amino Acid Infusion (PROSOL) 20 % SOLN every Monday, Wednesday, and Friday.  10/27/14  Yes [provider]  Ascorbic Acid (VITAMIN C) 1000 MG tablet Take 1,000 mg by mouth daily.   Yes [provider]  aspirin EC 81 MG tablet Take 81 mg by mouth at bedtime.    Yes [provider]  B Complex-C-Folic Acid (NEPHRO-VITE PO) Take 1 tablet by mouth every evening.    Yes [provider]  cinacalcet (SENSIPAR) 30 MG tablet Take 30 mg by mouth at bedtime.    Yes [provider]  Coenzyme Q10 (COQ10) 100 MG CAPS Take  100 mg by mouth at bedtime.   Yes [provider]  diphenhydrAMINE (BENADRYL) 25 mg capsule Take 25 mg by mouth at bedtime.    Yes [provider]  LANTUS SOLOSTAR 100 UNIT/ML Solostar Pen Inject 8-10 Units into the skin every evening.  12/21/13  Yes [provider]  nitroGLYCERIN (NITROSTAT) 0.4 MG SL tablet Place 1 tablet (0.4 mg total) under the tongue every 5 (five) minutes as needed for chest pain. 11/18/14  Yes Samuella Cota, MD  omeprazole (PRILOSEC) 20 MG capsule Take 20 mg by mouth 2 (two) times daily before a meal.  02/03/17  Yes [provider]  [place] test strip  03/23/16   [provider]    Physical Exam: Vitals:   02/10/17 1645 02/10/17 1700 02/10/17 1946 02/10/17 1953  BP:  (!) 189/71 (!) 204/72 140/66  Pulse: 75 72 72 76  Resp: 12 15 16 16   Temp:      TempSrc:  SpO2: 100% 100% 100% 99%  Weight:          Constitutional: NAD, calm, chronically-ill appearing Eyes: PERTLA, lids and conjunctivae normal ENMT: Mucous membranes are moist. Posterior pharynx clear of any exudate or lesions.   Neck: normal, supple, no masses, no thyromegaly Respiratory: Slightly diminished bilaterally, no wheezing, no crackles. Normal respiratory effort.   Cardiovascular: S1 & S2 heard, regular rate and rhythm. No significant JVD. Abdomen: No distension, no tenderness, no masses palpated. Bowel sounds normal.  Musculoskeletal: no clubbing / cyanosis. Status-post bilateral BKA.  Skin: no significant rashes, lesions, ulcers. Warm, dry, well-perfused. Neurologic: CN 2-12 grossly intact. Sensation to light touch diminished over left lower face. Strength 5/5 in all 4 limbs.  Psychiatric:  Alert and oriented x 3. Pleasant, cooperative.    Labs on Admission: I have personally reviewed following labs and imaging studies  CBC: Recent Labs  Lab 02/10/17 1554  WBC 8.1  NEUTROABS 4.3  HGB 11.2*  HCT 34.1*  MCV 93.2  PLT 767    Basic Metabolic Panel: Recent Labs  Lab 02/10/17 1554  NA 136  K 2.9*  CL 96*  CO2 26  GLUCOSE 245*  BUN 37*  CREATININE 3.48*  CALCIUM 8.1*   GFR: Estimated Creatinine Clearance: 17.5 mL/min (A) (by C-G formula based on SCr of 3.48 mg/dL (H)). Liver Function Tests: No results for input(s): AST, ALT, ALKPHOS, BILITOT, PROT, ALBUMIN in the last 168 hours. No results for input(s): LIPASE, AMYLASE in the last 168 hours. No results for input(s): AMMONIA in the last 168 hours. Coagulation Profile: Recent Labs  Lab 02/10/17 1554  INR 0.99   Cardiac Enzymes: Recent Labs  Lab 02/10/17 1554  TROPONINI 0.03*   BNP (last 3 results) No results for input(s): PROBNP in the last 8760 hours. HbA1C: No results for input(s): HGBA1C in the last 72 hours. CBG: No results for input(s): GLUCAP in the last 168 hours. Lipid Profile: No results for input(s): CHOL, HDL, LDLCALC, TRIG, CHOLHDL, LDLDIRECT in the last 72 hours. Thyroid Function Tests: No results for input(s): TSH, T4TOTAL, FREET4, T3FREE, THYROIDAB in the last 72 hours. Anemia Panel: No results for input(s): VITAMINB12, FOLATE, FERRITIN, TIBC, IRON, RETICCTPCT in the last 72 hours. Urine analysis:    Component Value Date/Time   COLORURINE YELLOW 06/14/2016 2314   APPEARANCEUR CLOUDY (A) 06/14/2016 2314   LABSPEC 1.009 06/14/2016 2314   PHURINE 7.0 06/14/2016 2314   GLUCOSEU NEGATIVE 06/14/2016 2314   HGBUR SMALL (A) 06/14/2016 2314   BILIRUBINUR NEGATIVE 06/14/2016 2314   KETONESUR NEGATIVE 06/14/2016 2314   PROTEINUR 100 (A) 06/14/2016 2314   UROBILINOGEN 4.0 (H) 11/05/2014 1930   NITRITE NEGATIVE 06/14/2016 2314   LEUKOCYTESUR LARGE (A) 06/14/2016 2314   Sepsis Labs: @LABRCNTIP (procalcitonin:4,lacticidven:4) )No results found for this or any previous visit (from the past 240 hour(s)).   Radiological Exams on Admission: Dg Chest 2 View  Result Date: 02/10/2017 CLINICAL DATA:  Chest pain. EXAM: CHEST  2 VIEW  COMPARISON:  Radiograph of June 15, 2016. FINDINGS: Stable cardiomediastinal silhouette. No pneumothorax or pleural effusion is noted. Stable bilateral midlung scarring is noted. Stable elevated left hemidiaphragm is noted. No acute pulmonary disease is noted. Bony thorax is unremarkable. IMPRESSION: Stable bilateral midlung scarring.  No acute abnormality seen. Electronically Signed   By: Marijo Conception, M.D.   On: 02/10/2017 16:27   Ct Head Wo Contrast  Result Date: 02/10/2017 CLINICAL DATA:  Left-sided numbness. EXAM: CT HEAD WITHOUT CONTRAST TECHNIQUE: Contiguous axial images  were obtained from the base of the skull through the vertex without intravenous contrast. COMPARISON:  None. FINDINGS: Brain: Mild chronic ischemic white matter disease is noted. No mass effect or midline shift is noted. Focal high density is seen in superior portion of left parietal cortex concerning for intraparenchymal hemorrhage or contusion. No mass lesion or acute infarction is noted. Vascular: No hyperdense vessel or unexpected calcification. Skull: Normal. Negative for fracture or focal lesion. Sinuses/Orbits: Mild bilateral ethmoid sinusitis is noted. Other: None. IMPRESSION: Mild chronic ischemic white matter disease. Probable focal intraparenchymal hemorrhage or contusion seen involving superior portion of left parietal cortex. MRI is recommended for further evaluation. Critical Value/emergent results were called by telephone at the time of interpretation on 02/10/2017 at 4:40 pm to Dr. Davonna Belling , who verbally acknowledged these results. Electronically Signed   By: Marijo Conception, M.D.   On: 02/10/2017 16:40   Mr Brain Wo Contrast  Result Date: 02/10/2017 CLINICAL DATA:  LEFT facial numbness and chest pain today. History of stroke, end-stage renal disease on dialysis, diabetes, hypertension, hyperlipidemia. EXAM: MRI HEAD WITHOUT CONTRAST TECHNIQUE: Multiplanar, multiecho pulse sequences of the brain and  surrounding structures were obtained without intravenous contrast. COMPARISON:  CT HEAD February 10, 2017 at 1617 hours FINDINGS: Multiple sequences are moderately motion degraded. BRAIN: No reduced diffusion to suggest acute ischemia. LEFT posterior frontal lobe susceptibility artifact corresponding to density on today's CT, with hemosiderin ring, no associated edema. Patchy bilateral cerebellar T2 hyperintensities. Old bilateral thalamus lacunar infarcts. Old LEFT pontine infarct. The ventricles and sulci are normal for patient's age. No suspicious parenchymal signal, mass or mass effect. No abnormal extra-axial fluid collections. VASCULAR: Normal major intracranial vascular flow voids present at skull base. SKULL AND UPPER CERVICAL SPINE: No abnormal sellar expansion. No suspicious calvarial bone marrow signal. Craniocervical junction maintained. SINUSES/ORBITS: Posterior ethmoid opacification in mild paranasal sinus mucosal thickening. Mastoid air cells are well aerated. The included ocular globes and orbital contents are non-suspicious. OTHER: None. IMPRESSION: 1. No acute process on this moderately motion degraded examination. 2. 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 3. Multiple old cerebellar infarcts though, given rounded morphology, metastatic disease is possible. Recommend nonemergent follow-up MRI of the brain with contrast when patient is able to remain still. 4. Old lacunar infarcts and moderate chronic small vessel ischemic disease. Electronically Signed   By: Elon Alas M.D.   On: 02/10/2017 19:29    EKG: Independently reviewed. Sinus rhythm.   Assessment/Plan  1. Chest pain; CAD  - Pt presents with left-sided chest pain at rest, has known CAD s/p remote CABG and stenting - EKG and CXR unremarkable, initial troponin 0.03  - Treated with ASA 324, started on Lopressor in setting of hypertension, continue cardiac monitoring, obtain  serial troponin measurements, repeat EKG, check echocardiogram    2. Left facial numbness - Pt presents with left facial numbness  - MRI brain negative for acute process, notable for old CVA's vs metastatic disease - Continue neuro checks, PT/OT eval, aspirin, glycemic-control, supportive care  3. ESRD  - Dialyzed without incident yesterday  - No acidosis, uremia, hyperkalemia, or significant volume excess on admission; HTN improved with Lopressor  - SLIV, fluid-restrict, repeat chemistries in am    4. Hypertension with hypertensive urgency  - BP elevated to 200/70 range in ED  - Start Lopressor in setting of CAD with chest pain    5. Insulin-dependent DM  - A1c was only 5.3% last year  but serum glucose 245 on admission  - Managed at home with Lantus 8-10 units qHS  - Check CBG's, continue Lantus and start a low-intensity SSI -   6. Hypokalemia  - Serum potassium is 2.9 on admission  - Treated with 20 mEq oral potassium  - Check mag, repeat chem panel in am, continue cardiac monitoring    DVT prophylaxis: sq heparin  Code Status: Full  Family Communication: Discussed with patient  Disposition Plan: Admit to SDU at [facility] Consults called: None Admission status: Inpatient    Vianne Bulls, MD [facility] Pager [phone]  If 7PM-7AM, please contact night-coverage [email or web address] Password [facility]  02/10/2017, 8:41 PM

## 2017-02-10 NOTE — ED Provider Notes (Signed)
[facility] EMERGENCY DEPARTMENT Provider Note   CSN: [id number] Arrival date & time: 02/10/17  1534     History   Chief Complaint Chief Complaint  Patient presents with  . Numbness    HPI Alan [name] is a 76 y.o. male.  HPI Patient presents with chest pain and numbness.  Chest pain is coming on over the last couple days.  It is dull in his left chest.  States is been severe.  Has had pains like this before.  Also had previous heart attack.  Does not remember what that felt like.  He is a dialysis patient and was dialyzed on Saturday.  This was an unusual day for him because of the holiday with tomorrow being Christmas.  Pain is continued.  States her able to do full dialysis.  States she is also had numbness in his face and on his left side.  Previous history of strokes it does involve the left side of the past.  Somewhat difficult to get history from but apparently does not have deficits at baseline but is in a wheelchair and has previous bilateral below the knee amputations. Past Medical History:  Diagnosis Date  . Arthritis   . Asthma   . Clotted renal dialysis arteriovenous graft ([place])   . Coronary artery disease 2000   Stents [facility]  . CVA (cerebral infarction)   . DDD (degenerative disc disease), cervical   . DDD (degenerative disc disease), lumbar   . Diabetes mellitus   . 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 Gangrene of toe ([place]) Feb. 2015   Right  great    . Gastric ulcer    EGD 2014  . Gout   . Hypercholesteremia   . Hypertension   . Stroke ([place])   . 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 Thrombosis of renal dialysis arteriovenous graft [facility])     Patient Active Problem List   Diagnosis Date Noted  . Atypical chest pain 06/15/2016  . Renal dialysis device, implant, or graft complication [id number]/[id number]  . Septic shock ([place]) 08/28/2015  . 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 Lower urinary tract infectious disease   . Essential hypertension  08/13/2015  . Nausea and vomiting 08/13/2015  . ESRD (end stage renal disease) ([place]) 04/12/2015  . Gastroenteritis 04/12/2015  . S/P bilateral BKA (below knee amputation) ([employer]) 04/12/2015  . HLD (hyperlipidemia) 04/12/2015  . Acute chest pain 11/16/2014  . Atherosclerosis of native arteries of the extremities with gangrene ([place]) 04/20/2013  . Sepsis ([place]) 04/13/2013  . Gram-negative bacteremia 04/12/2013  . ESRD on dialysis ([place]) 04/09/2013  . 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 Protein-calorie malnutrition, severe ([place]) 04/09/2013  . Gangrene of toe ([place]) 04/08/2013  . Toe gangrene ([place]) 04/08/2013  . Nausea with vomiting 05/22/2012  . S/P BKA (below knee amputation) unilateral, left 05/20/2012  . Musculoskeletal chest pain 05/20/2012  . Acute renal failure ([place]) 05/19/2012  . CAD (coronary artery disease) 05/19/2012  . DM type 2 causing vascular disease ([place]) 05/19/2012  . Hyperkalemia 05/19/2012    Past Surgical History:  Procedure Laterality Date  . A/V FISTULAGRAM Left 05/28/2016   Procedure: A/V Fistulagram;  Surgeon: Katha Cabal, MD;  Location: [place] [facility];  Service: Cardiovascular;  Laterality: Left;  . A/V FISTULAGRAM Left 10/28/2016   Procedure: A/V Fistulagram;  Surgeon: Algernon Huxley, MD;  Location: [place] [facility];  Service: Cardiovascular;  Laterality: Left;  . A/V SHUNT INTERVENTION N/A 05/28/2016   Procedure:  A/V Shunt Intervention;  Surgeon: Katha Cabal, MD;  Location: [place] [facility];  Service: Cardiovascular;  Laterality: N/A;  . AMPUTATION Right 04/10/2013   Procedure: Right Below Knee Amputation;  Surgeon: Newt Minion, MD;  Location: [place];  Service: Orthopedics;  Laterality: Right;  Right Below Knee Amputation  . APPENDECTOMY    . BELOW KNEE LEG AMPUTATION Bilateral   . CHOLECYSTECTOMY    . CORONARY ARTERY BYPASS GRAFT  1996   [facility]  . ESOPHAGOGASTRODUODENOSCOPY Left 05/24/2012   SNK:[id number] dilated baggy but otherwise a  normal/ Small hiatal hernia. Gastric ulcer -S/P biopsy  . PERIPHERAL VASCULAR CATHETERIZATION N/A 08/09/2014   Procedure: A/V Shuntogram/Fistulagram;  Surgeon: Katha Cabal, MD;  Location: [place] [facility];  Service: Cardiovascular;  Laterality: N/A;  . PERIPHERAL VASCULAR CATHETERIZATION Left 08/09/2014   Procedure: A/V Shunt Intervention;  Surgeon: Katha Cabal, MD;  Location: [place] [facility];  Service: Cardiovascular;  Laterality: Left;       Home Medications    Prior to Admission medications   Medication Sig Start Date End Date Taking? Authorizing Provider  acetaminophen (TYLENOL) 500 MG tablet Take 1,000 mg by mouth every 8 (eight) hours as needed for mild pain or moderate pain.    Yes [provider]  albuterol (PROVENTIL HFA;VENTOLIN HFA) 108 (90 Base) MCG/ACT inhaler Inhale 2 puffs into the lungs every 4 (four) hours as needed for wheezing or shortness of breath.   Yes [provider]  Amino Acid Infusion (PROSOL) 20 % SOLN every Monday, Wednesday, and Friday.  10/27/14  Yes [provider]  Ascorbic Acid (VITAMIN C) 1000 MG tablet Take 1,000 mg by mouth daily.   Yes [provider]  aspirin EC 81 MG tablet Take 81 mg by mouth at bedtime.    Yes [provider]  B Complex-C-Folic Acid (NEPHRO-VITE PO) Take 1 tablet by mouth every evening.    Yes [provider]  cinacalcet (SENSIPAR) 30 MG tablet Take 30 mg by mouth at bedtime.    Yes [provider]  Coenzyme Q10 (COQ10) 100 MG CAPS Take 100 mg by mouth at bedtime.   Yes [provider]  diphenhydrAMINE (BENADRYL) 25 mg capsule Take 25 mg by mouth at bedtime.    Yes [provider]  LANTUS SOLOSTAR 100 UNIT/ML Solostar Pen Inject 8-10 Units into the skin every evening.  12/21/13  Yes [provider]  nitroGLYCERIN (NITROSTAT) 0.4 MG SL tablet Place 1 tablet (0.4 mg total) under the tongue every 5 (five) minutes as needed for chest  pain. 11/18/14  Yes Samuella Cota, MD  omeprazole (PRILOSEC) 20 MG capsule Take 20 mg by mouth 2 (two) times daily before a meal.  02/03/17  Yes [provider]  [place] test strip  03/23/16   [provider]    Family History Family History  Problem Relation Age of Onset  . Cancer Mother   . Colon cancer Neg Hx     Social History Social History   Tobacco Use  . Smoking status: Former Smoker    Last attempt to quit: 08/08/2004    Years since quitting: 12.5  . Smokeless tobacco: Never Used  Substance Use Topics  . Alcohol use: No  . Drug use: No     Allergies   Codeine and Yellow jacket venom [bee venom]   Review of Systems Review of Systems  Constitutional: Negative for appetite change.  HENT: Negative for congestion.  Respiratory: Negative for cough.   Cardiovascular: Positive for chest pain.  Gastrointestinal: Negative for abdominal pain.  Genitourinary: Negative for flank pain.  Musculoskeletal: Negative for back pain.  Skin: Negative for rash.  Neurological: Positive for numbness. Negative for weakness.  Psychiatric/Behavioral: Negative for confusion.     Physical Exam Updated Vital Signs BP (!) 204/72 (BP Location: Right Arm)   Pulse 72   Temp 97.7 F (36.5 C) (Oral)   Resp 16   Wt 73 kg (161 lb)   SpO2 100%   BMI 23.78 kg/m   Physical Exam  Constitutional: He appears well-developed.  HENT:  Head: Atraumatic.  Eyes: EOM are normal.  Neck: Neck supple.  Cardiovascular: Normal rate.  Pulmonary/Chest: He has no wheezes. He has no rales. He exhibits tenderness.  Some tenderness left anterior upper chest wall.  Abdominal: There is no tenderness.  Musculoskeletal: He exhibits no tenderness.  Bilateral below-knee amputations.  Neurological:  Face symmetric.  Decreased sensation to left side of face.  Eye movements intact.  Good grip strength bilaterally but does have some chronic wasting of his thenar area of his  hands bilaterally.  Has some difficulty moving both of his arms due to his bad shoulders.  States decreased sensation in left hand but also states is decreased on the right hand.  Skin: Skin is warm. Capillary refill takes less than 2 seconds.     ED Treatments / Results  Labs (all labs ordered are listed, but only abnormal results are displayed) Labs Reviewed  TROPONIN I - Abnormal; Notable for the following components:      Result Value   Troponin I 0.03 (*)    All other components within normal limits  CBC WITH DIFFERENTIAL/PLATELET - Abnormal; Notable for the following components:   RBC 3.66 (*)    Hemoglobin 11.2 (*)    HCT 34.1 (*)    All other components within normal limits  BASIC METABOLIC PANEL - Abnormal; Notable for the following components:   Potassium 2.9 (*)    Chloride 96 (*)    Glucose, Bld 245 (*)    BUN 37 (*)    Creatinine, Ser 3.48 (*)    Calcium 8.1 (*)    GFR calc non Af Amer 15 (*)    GFR calc Af Amer 18 (*)    All other components within normal limits  PROTIME-INR    EKG  EKG Interpretation  Date/Time:  Monday February 10 2017 15:57:42 EST Ventricular Rate:  80 PR Interval:    QRS Duration: 98 QT Interval:  416 QTC Calculation: 480 R Axis:   8 Text Interpretation:  Sinus rhythm Probable left atrial enlargement Probable anteroseptal infarct, old Confirmed by Davonna Belling [phone]) on 02/10/2017 4:03:45 PM       Radiology Dg Chest 2 View  Result Date: 02/10/2017 CLINICAL DATA:  Chest pain. EXAM: CHEST  2 VIEW COMPARISON:  Radiograph of June 15, 2016. FINDINGS: Stable cardiomediastinal silhouette. No pneumothorax or pleural effusion is noted. Stable bilateral midlung scarring is noted. Stable elevated left hemidiaphragm is noted. No acute pulmonary disease is noted. Bony thorax is unremarkable. IMPRESSION: Stable bilateral midlung scarring.  No acute abnormality seen. Electronically Signed   By: Marijo Conception, M.D.   On: 02/10/2017 16:27     Ct Head Wo Contrast  Result Date: 02/10/2017 CLINICAL DATA:  Left-sided numbness. EXAM: CT HEAD WITHOUT CONTRAST TECHNIQUE: Contiguous axial images were obtained from the base of the skull through the vertex without intravenous contrast.  COMPARISON:  None. FINDINGS: Brain: Mild chronic ischemic white matter disease is noted. No mass effect or midline shift is noted. Focal high density is seen in superior portion of left parietal cortex concerning for intraparenchymal hemorrhage or contusion. No mass lesion or acute infarction is noted. Vascular: No hyperdense vessel or unexpected calcification. Skull: Normal. Negative for fracture or focal lesion. Sinuses/Orbits: Mild bilateral ethmoid sinusitis is noted. Other: None. IMPRESSION: Mild chronic ischemic white matter disease. Probable focal intraparenchymal hemorrhage or contusion seen involving superior portion of left parietal cortex. MRI is recommended for further evaluation. Critical Value/emergent results were called by telephone at the time of interpretation on 02/10/2017 at 4:40 pm to Dr. Davonna Belling , who verbally acknowledged these results. Electronically Signed   By: Marijo Conception, M.D.   On: 02/10/2017 16:40   Mr Brain Wo Contrast  Result Date: 02/10/2017 CLINICAL DATA:  LEFT facial numbness and chest pain today. History of stroke, end-stage renal disease on dialysis, diabetes, hypertension, hyperlipidemia. EXAM: MRI HEAD WITHOUT CONTRAST TECHNIQUE: Multiplanar, multiecho pulse sequences of the brain and surrounding structures were obtained without intravenous contrast. COMPARISON:  CT HEAD February 10, 2017 at 1617 hours FINDINGS: Multiple sequences are moderately motion degraded. BRAIN: No reduced diffusion to suggest acute ischemia. LEFT posterior frontal lobe susceptibility artifact corresponding to density on today's CT, with hemosiderin ring, no associated edema. Patchy bilateral cerebellar T2 hyperintensities. Old bilateral thalamus  lacunar infarcts. Old LEFT pontine infarct. The ventricles and sulci are normal for patient's age. No suspicious parenchymal signal, mass or mass effect. No abnormal extra-axial fluid collections. VASCULAR: Normal major intracranial vascular flow voids present at skull base. SKULL AND UPPER CERVICAL SPINE: No abnormal sellar expansion. No suspicious calvarial bone marrow signal. Craniocervical junction maintained. SINUSES/ORBITS: Posterior ethmoid opacification in mild paranasal sinus mucosal thickening. Mastoid air cells are well aerated. The included ocular globes and orbital contents are non-suspicious. OTHER: None. IMPRESSION: 1. No acute process on this moderately motion degraded examination. 2. 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 3. Multiple old cerebellar infarcts though, given rounded morphology, metastatic disease is possible. Recommend nonemergent follow-up MRI of the brain with contrast when patient is able to remain still. 4. Old lacunar infarcts and moderate chronic small vessel ischemic disease. Electronically Signed   By: Elon Alas M.D.   On: 02/10/2017 19:29    Procedures Procedures (including critical care time)  Medications Ordered in ED Medications  nitroGLYCERIN (NITROSTAT) SL tablet 0.4 mg (0.4 mg Sublingual Given 02/10/17 1948)     Initial Impression / Assessment and Plan / ED Course  I have reviewed the triage vital signs and the nursing notes.  Pertinent labs & imaging results that were available during my care of the patient were reviewed by me and considered in my medical decision making (see chart for details).     Patient with chest pain.  Patient with pain on his left chest.  Worse with palpation but states that is not the same pain he has been having.  Also has had left-sided numbness.  CT scan showed abnormality but would not account for the potential symptoms.  MRI did not show any real pathology at this  spot but does have some likely chronic  smaller strokes.  Some question of tumor on MRI but I think this is likely the old strokes.  I do not know if he can get MRI with contrast was recommended due to his renal failure.  [place] admit to hospitalist.  Final Clinical Impressions(s) / ED Diagnoses   Final diagnoses:  Chest pain, unspecified type  Hypertension, unspecified type  Numbness    ED Discharge Orders    None       Davonna Belling, MD 02/10/17 [phone]

## 2017-02-11 ENCOUNTER — Inpatient Hospital Stay (HOSPITAL_COMMUNITY): Payer: Medicare Other

## 2017-02-11 DIAGNOSIS — I160 Hypertensive urgency: Secondary | ICD-10-CM

## 2017-02-11 DIAGNOSIS — R079 Chest pain, unspecified: Secondary | ICD-10-CM

## 2017-02-11 DIAGNOSIS — I2510 Atherosclerotic heart disease of native coronary artery without angina pectoris: Secondary | ICD-10-CM

## 2017-02-11 DIAGNOSIS — Z992 Dependence on renal dialysis: Secondary | ICD-10-CM

## 2017-02-11 DIAGNOSIS — E1159 Type 2 diabetes mellitus with other circulatory complications: Secondary | ICD-10-CM

## 2017-02-11 DIAGNOSIS — I200 Unstable angina: Secondary | ICD-10-CM

## 2017-02-11 DIAGNOSIS — N186 End stage renal disease: Secondary | ICD-10-CM

## 2017-02-11 LAB — COMPREHENSIVE METABOLIC PANEL
ALBUMIN: 2.3 g/dL — AB (ref 3.5–5.0)
ALT: 11 U/L — ABNORMAL LOW (ref 17–63)
AST: 15 U/L (ref 15–41)
Alkaline Phosphatase: 49 U/L (ref 38–126)
Anion gap: 9 (ref 5–15)
BILIRUBIN TOTAL: 0.8 mg/dL (ref 0.3–1.2)
BUN: 44 mg/dL — AB (ref 6–20)
CO2: 27 mmol/L (ref 22–32)
Calcium: 7.7 mg/dL — ABNORMAL LOW (ref 8.9–10.3)
Chloride: 98 mmol/L — ABNORMAL LOW (ref 101–111)
Creatinine, Ser: 4.57 mg/dL — ABNORMAL HIGH (ref 0.61–1.24)
GFR calc Af Amer: 13 mL/min — ABNORMAL LOW (ref >60)
GFR calc non Af Amer: 11 mL/min — ABNORMAL LOW (ref >60)
GLUCOSE: 85 mg/dL (ref 65–99)
POTASSIUM: 3.3 mmol/L — AB (ref 3.5–5.1)
Sodium: 134 mmol/L — ABNORMAL LOW (ref 135–145)
TOTAL PROTEIN: 5.4 g/dL — AB (ref 6.5–8.1)

## 2017-02-11 LAB — LIPID PANEL
Cholesterol: 224 mg/dL — ABNORMAL HIGH (ref 0–200)
HDL: 39 mg/dL — AB (ref >40)
LDL Cholesterol: 168 mg/dL — ABNORMAL HIGH (ref 0–99)
TRIGLYCERIDES: 86 mg/dL (ref <150)
Total CHOL/HDL Ratio: 5.7 RATIO
VLDL: 17 mg/dL (ref 0–40)

## 2017-02-11 LAB — CBC
HEMATOCRIT: 28.2 % — AB (ref 39.0–52.0)
Hemoglobin: 9.4 g/dL — ABNORMAL LOW (ref 13.0–17.0)
MCH: 30.2 pg (ref 26.0–34.0)
MCHC: 33.3 g/dL (ref 30.0–36.0)
MCV: 90.7 fL (ref 78.0–100.0)
PLATELETS: 173 10*3/uL (ref 150–400)
RBC: 3.11 MIL/uL — AB (ref 4.22–5.81)
RDW: 13.3 % (ref 11.5–15.5)
WBC: 6.9 10*3/uL (ref 4.0–10.5)

## 2017-02-11 LAB — MAGNESIUM: Magnesium: 1.8 mg/dL (ref 1.7–2.4)

## 2017-02-11 LAB — HEMOGLOBIN A1C
HEMOGLOBIN A1C: 6.7 % — AB (ref 4.8–5.6)
Mean Plasma Glucose: 145.59 mg/dL

## 2017-02-11 LAB — ECHOCARDIOGRAM COMPLETE: WEIGHTICAEL: 2694.9 [oz_av]

## 2017-02-11 LAB — GLUCOSE, CAPILLARY
GLUCOSE-CAPILLARY: 126 mg/dL — AB (ref 65–99)
Glucose-Capillary: 93 mg/dL (ref 65–99)
Glucose-Capillary: 94 mg/dL (ref 65–99)
Glucose-Capillary: 115 mg/dL — ABNORMAL HIGH (ref 65–99)

## 2017-02-11 LAB — TROPONIN I
Troponin I: <0.03 ng/mL (ref <0.03)
Troponin I: <0.03 ng/mL (ref <0.03)

## 2017-02-11 LAB — MRSA PCR SCREENING: MRSA BY PCR: NEGATIVE

## 2017-02-11 MED ORDER — SODIUM CHLORIDE 0.9% FLUSH
3.0000 mL | Freq: Two times a day (BID) | INTRAVENOUS | Status: DC
  Administered 2017-02-11 – 2017-02-12 (×3): 3 mL via INTRAVENOUS

## 2017-02-11 MED ORDER — SODIUM CHLORIDE 0.9 % IV SOLN
INTRAVENOUS | Status: DC
  Administered 2017-02-12: 06:00:00 via INTRAVENOUS

## 2017-02-11 MED ORDER — SODIUM CHLORIDE 0.9% FLUSH: 3.0000 mL | INTRAVENOUS | Status: DC | PRN

## 2017-02-11 MED ORDER — SODIUM CHLORIDE 0.9 % IV SOLN: 250.0000 mL | INTRAVENOUS | Status: DC | PRN

## 2017-02-11 MED ORDER — PNEUMOCOCCAL VAC POLYVALENT 25 MCG/0.5ML IJ INJ
0.5000 mL | INJECTION | INTRAMUSCULAR | Status: AC
  Administered 2017-02-13: 0.5 mL via INTRAMUSCULAR

## 2017-02-11 MED ORDER — ASPIRIN 81 MG PO CHEW
81.0000 mg | CHEWABLE_TABLET | ORAL | Status: AC
  Administered 2017-02-12: 81 mg via ORAL
  Filled 2017-02-11: qty 1

## 2017-02-11 MED ORDER — INFLUENZA VAC SPLIT HIGH-DOSE 0.5 ML IM SUSY
0.5000 mL | PREFILLED_SYRINGE | INTRAMUSCULAR | Status: AC
  Administered 2017-02-13: 0.5 mL via INTRAMUSCULAR
  Filled 2017-02-11: qty 0.5

## 2017-02-11 NOTE — Consult Note (Signed)
Cardiology Consultation:   Patient IDIsaiyah [name]; [id number]; 18-Dec-1940   Admit date: 02/10/2017 Date of Consult: 02/11/2017  Primary Care Provider: [facility] Primary Cardiologist: remotely Dr. Harl Bowie Primary Electrophysiologist:  N/A   Patient Profile:   Alan [name] is a 76 y.o. male with a hx of CAD s/p CABG, ESRD on HD MWF, history of CVA, PAD s/p bilateral below knee amputation, hypertension and DM 2 on insulin who is being seen today for the evaluation of chest pain at the request of Dr. Posey Pronto.  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  He also has significant peripheral arterial disease and underwent bilateral below-knee amputation.  He was seen in consultation by Dr. Harl Bowie on 11/17/2014 for chest pain.  Previous history include CABG x2 with LIMA to LAD, and SVG to OM1 in 1994 at [facility].  He also had stents of unknown artery at [name] as well.  Last echocardiogram obtained on 11/17/2014 showed EF 60-65%, moderately calcified aortic valve.  Myoview obtained on 11/18/2014 showed EF 61%, finding consistent with prior inferolateral MI was mild peri-infarct ischemia, overall low risk study.  According to the patient, for the past 3 weeks, he has been having some chest pain.  He also have a left facial numbness in the jaw numbness as well.  He says his left side is mildly weak.  The chest pain is described as a left substernal chest pain radiating to the jaw.  He does not remember the symptoms associated with the previous angina.  He does not do much strenuous activity due to bilateral below-knee amputation.  He gets around with a wheelchair.  He says he initially to the chest pain only occurs 1-2 times per week, however in the past week, the chest pain has been occurring almost on a daily basis.  Sometimes it can  last hours at a time.  He eventually sought medical attention at [facility].  Serial troponin was only borderline elevated at 0.03.  Due to numbness of the left face, he underwent MRI of the brain without contrast which showed multiple old cerebellar infarct, however no new etiology.  CT of the head was also negative.  His potassium was 2.9 on arrival.  EKG showed normal sinus rhythm with poor R wave progression in the anterior leads.   Past Medical History:  Diagnosis Date  . Arthritis   . Asthma   . Clotted renal dialysis arteriovenous graft ([place])   . Coronary artery disease 2000   Stents [facility]  . CVA (cerebral infarction)   . DDD (degenerative disc disease), cervical   . DDD (degenerative disc disease), lumbar   . Diabetes mellitus   . ESRD (end stage renal disease) on dialysis ([place])   . Gangrene of toe ([place]) Feb. 2015   Right  great    . Gastric ulcer    EGD 2014  . Gout   . Hypercholesteremia   . Hypertension   . Stroke ([place])   . 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 Thrombosis of renal dialysis arteriovenous graft [facility])     Past Surgical History:  Procedure Laterality Date  . A/V FISTULAGRAM Left 05/28/2016   Procedure: A/V Fistulagram;  Surgeon: Katha Cabal, MD;  Location: [place] CV [facility];  Service: Cardiovascular;  Laterality: Left;  . A/V FISTULAGRAM Left 10/28/2016  Procedure: A/V Fistulagram;  Surgeon: Algernon Huxley, MD;  Location: [place] [facility];  Service: Cardiovascular;  Laterality: Left;  . A/V SHUNT INTERVENTION N/A 05/28/2016   Procedure: A/V Shunt Intervention;  Surgeon: Katha Cabal, MD;  Location: [place] [facility];  Service: Cardiovascular;  Laterality: N/A;  . AMPUTATION Right 04/10/2013   Procedure: Right Below Knee Amputation;  Surgeon: Newt Minion, MD;  Location: [name];  Service: Orthopedics;  Laterality: Right;  Right Below Knee Amputation  . APPENDECTOMY    . BELOW KNEE LEG AMPUTATION Bilateral   . CHOLECYSTECTOMY      . CORONARY ARTERY BYPASS GRAFT  1996   Reading  . ESOPHAGOGASTRODUODENOSCOPY Left 05/24/2012   VCB:SWHQPRFF dilated baggy but otherwise a normal/ Small hiatal hernia. Gastric ulcer -S/P biopsy  . PERIPHERAL VASCULAR CATHETERIZATION N/A 08/09/2014   Procedure: A/V Shuntogram/Fistulagram;  Surgeon: Katha Cabal, MD;  Location: [place] [facility];  Service: Cardiovascular;  Laterality: N/A;  . PERIPHERAL VASCULAR CATHETERIZATION Left 08/09/2014   Procedure: A/V Shunt Intervention;  Surgeon: Katha Cabal, MD;  Location: [place] [facility];  Service: Cardiovascular;  Laterality: Left;     Home Medications:  Prior to Admission medications   Medication Sig Start Date End Date Taking? Authorizing Provider  acetaminophen (TYLENOL) 500 MG tablet Take 1,000 mg by mouth every 8 (eight) hours as needed for mild pain or moderate pain.    Yes [provider]  albuterol (PROVENTIL HFA;VENTOLIN HFA) 108 (90 Base) MCG/ACT inhaler Inhale 2 puffs into the lungs every 4 (four) hours as needed for wheezing or shortness of breath.   Yes [provider]  Amino Acid Infusion (PROSOL) 20 % SOLN every Monday, Wednesday, and Friday.  10/27/14  Yes [provider]  Ascorbic Acid (VITAMIN C) 1000 MG tablet Take 1,000 mg by mouth daily.   Yes [provider]  aspirin EC 81 MG tablet Take 81 mg by mouth at bedtime.    Yes [provider]  B Complex-C-Folic Acid (NEPHRO-VITE PO) Take 1 tablet by mouth every evening.    Yes [provider]  cinacalcet (SENSIPAR) 30 MG tablet Take 30 mg by mouth at bedtime.    Yes [provider]  Coenzyme Q10 (COQ10) 100 MG CAPS Take 100 mg by mouth at bedtime.   Yes [provider]  diphenhydrAMINE (BENADRYL) 25 mg capsule Take 25 mg by mouth at bedtime.    Yes [provider]  LANTUS SOLOSTAR 100 UNIT/ML Solostar Pen Inject 8-10 Units into the skin every evening.  12/21/13  Yes [provider]   nitroGLYCERIN (NITROSTAT) 0.4 MG SL tablet Place 1 tablet (0.4 mg total) under the tongue every 5 (five) minutes as needed for chest pain. 11/18/14  Yes Samuella Cota, MD  omeprazole (PRILOSEC) 20 MG capsule Take 20 mg by mouth 2 (two) times daily before a meal.  02/03/17  Yes [provider]  [place] test strip  03/23/16   [provider]    Inpatient Medications: Scheduled Meds: .  stroke: mapping our early stages of recovery book   Does not apply Once  . aspirin EC  81 mg Oral QHS  . cinacalcet  30 mg Oral QHS  . diphenhydrAMINE  25 mg Oral QHS  . heparin  5,000 Units Subcutaneous Q8H  . [START ON 02/12/2017] Influenza vac split quadrivalent PF  0.5 mL Intramuscular Tomorrow-1000  . insulin aspart  0-5 Units Subcutaneous QHS  . insulin  aspart  0-9 Units Subcutaneous TID WC  . insulin glargine  8 Units Subcutaneous QHS  . metoprolol tartrate  25 mg Oral BID  . multivitamin  1 tablet Oral QPM  . pantoprazole  40 mg Oral Daily  . [START ON 02/12/2017] pneumococcal 23 valent vaccine  0.5 mL Intramuscular Tomorrow-1000   Continuous Infusions:  PRN Meds: acetaminophen, albuterol, morphine injection, nitroGLYCERIN, ondansetron (ZOFRAN) IV  Allergies:    Allergies  Allergen Reactions  . Codeine Itching and Nausea And Vomiting  . [place] Venom [Bee Venom] Swelling    Social History:   Social History   Socioeconomic History  . Marital status: Married    Spouse name: Not on file  . Number of children: Not on file  . Years of education: Not on file  . Highest education level: Not on file  Social Needs  . Financial resource strain: Not on file  . Food insecurity - worry: Not on file  . Food insecurity - inability: Not on file  . Transportation needs - medical: Not on file  . Transportation needs - non-medical: Not on file  Occupational History  . Not on file  Tobacco Use  . Smoking status: Former Smoker    Last attempt to quit:  08/08/2004    Years since quitting: 12.5  . Smokeless tobacco: Never Used  Substance and Sexual Activity  . Alcohol use: No  . Drug use: No  . Sexual activity: Not Currently  Other Topics Concern  . Not on file  Social History Narrative  . Not on file    Family History:    Family History  Problem Relation Age of Onset  . Cancer Mother   . Colon cancer Neg Hx      ROS:  Please see the history of present illness.  ROS  All other ROS reviewed and negative.     Physical Exam/Data:   Vitals:   02/11/17 0318 02/11/17 0400 02/11/17 0459 02/11/17 0749  BP:  (!) 151/104 (!) 172/63 (!) 157/55  Pulse:    (!) 53  Resp: (!) 7 17 17 14   Temp:    98.3 F (36.8 C)  TempSrc:    Oral  SpO2:    99%  Weight:        Intake/Output Summary (Last 24 hours) at 02/11/2017 0817 Last data filed at 02/11/2017 0750 Gross per 24 hour  Intake 50 ml  Output 380 ml  Net -330 ml   Filed Weights   02/10/17 1540 02/11/17 0132  Weight: 161 lb (73 kg) 168 lb 6.9 oz (76.4 kg)   Body mass index is 24.87 kg/m.  General:  Well nourished, well developed, in no acute distress HEENT: normal Lymph: no adenopathy Neck: no JVD Endocrine:  No thryomegaly Vascular: Left AVF Cardiac:  normal S1, S2; RRR; no murmur  Lungs:  clear to auscultation bilaterally, no wheezing, rhonchi or rales  Abd: soft, nontender, no hepatomegaly  Ext: no edema Musculoskeletal: Bilateral below-knee amputation. Skin: warm and dry  Neuro:  CNs 2-12 intact, no focal abnormalities noted Psych:  Normal affect   EKG:  The EKG was personally reviewed and demonstrates: Normal sinus rhythm with poor R wave progression anterior leads. Telemetry:  Telemetry was personally reviewed and demonstrates:  NSR without significant ventricular ectopy  Relevant CV Studies:  Echo 11/17/2014 LV EF: 60% -   65%  Study Conclusions  - Left ventricle: The cavity size was normal. Wall thickness was   increased in a pattern  of moderate LVH.  Systolic function was   normal. The estimated ejection fraction was in the range of 60%   to 65%. Diastolic function is abnormal, indeterminate grade. Wall   motion was normal; there were no regional wall motion   abnormalities. - Aortic valve: Moderately calcified annulus. Trileaflet;   moderately thickened leaflets. Valve area (VTI): 3.03 cm^2. Valve   area (Vmax): 2.91 cm^2. - Mitral valve: Mildly calcified annulus. Normal thickness leaflets   . - Left atrium: The atrium was moderately dilated. - Right ventricle: The cavity size was mildly dilated. - Right atrium: The atrium was mildly dilated.   Myoview 11/18/2014  There was no ST segment deviation noted during stress.  Findings consistent with prior inferolateral myocardial infarction with mild peri-infarct ischemia.  This is a low risk study. Small area of myocardium at jeopardy.  Nuclear stress EF: 61%.  Laboratory Data:  Chemistry Recent Labs  Lab 02/10/17 1554  NA 136  K 2.9*  CL 96*  CO2 26  GLUCOSE 245*  BUN 37*  CREATININE 3.48*  CALCIUM 8.1*  GFRNONAA 15*  GFRAA 18*  ANIONGAP 14    No results for input(s): PROT, ALBUMIN, AST, ALT, ALKPHOS, BILITOT in the last 168 hours. Hematology Recent Labs  Lab 02/10/17 1554  WBC 8.1  RBC 3.66*  HGB 11.2*  HCT 34.1*  MCV 93.2  MCH 30.6  MCHC 32.8  RDW 13.4  PLT 209   Cardiac Enzymes Recent Labs  Lab 02/10/17 1554 02/10/17 2048  TROPONINI 0.03* 0.03*   No results for input(s): TROPIPOC in the last 168 hours.  BNPNo results for input(s): BNP, PROBNP in the last 168 hours.  DDimer No results for input(s): DDIMER in the last 168 hours.  Radiology/Studies:  Dg Chest 2 View  Result Date: 02/10/2017 CLINICAL DATA:  Chest pain. EXAM: CHEST  2 VIEW COMPARISON:  Radiograph of June 15, 2016. FINDINGS: Stable cardiomediastinal silhouette. No pneumothorax or pleural effusion is noted. Stable bilateral midlung scarring is noted. Stable elevated left  hemidiaphragm is noted. No acute pulmonary disease is noted. Bony thorax is unremarkable. IMPRESSION: Stable bilateral midlung scarring.  No acute abnormality seen. Electronically Signed   By: Marijo Conception, M.D.   On: 02/10/2017 16:27   Ct Head Wo Contrast  Result Date: 02/10/2017 CLINICAL DATA:  Left-sided numbness. EXAM: CT HEAD WITHOUT CONTRAST TECHNIQUE: Contiguous axial images were obtained from the base of the skull through the vertex without intravenous contrast. COMPARISON:  None. FINDINGS: Brain: Mild chronic ischemic white matter disease is noted. No mass effect or midline shift is noted. Focal high density is seen in superior portion of left parietal cortex concerning for intraparenchymal hemorrhage or contusion. No mass lesion or acute infarction is noted. Vascular: No hyperdense vessel or unexpected calcification. Skull: Normal. Negative for fracture or focal lesion. Sinuses/Orbits: Mild bilateral ethmoid sinusitis is noted. Other: None. IMPRESSION: Mild chronic ischemic white matter disease. Probable focal intraparenchymal hemorrhage or contusion seen involving superior portion of left parietal cortex. MRI is recommended for further evaluation. Critical Value/emergent results were called by telephone at the time of interpretation on 02/10/2017 at 4:40 pm to Dr. Davonna Belling , who verbally acknowledged these results. Electronically Signed   By: Marijo Conception, M.D.   On: 02/10/2017 16:40   Mr Brain Wo Contrast  Result Date: 02/10/2017 CLINICAL DATA:  LEFT facial numbness and chest pain today. History of stroke, end-stage renal disease on dialysis, diabetes, hypertension, hyperlipidemia. EXAM: MRI HEAD WITHOUT CONTRAST TECHNIQUE:  Multiplanar, multiecho pulse sequences of the brain and surrounding structures were obtained without intravenous contrast. COMPARISON:  CT HEAD February 10, 2017 at 1617 hours FINDINGS: Multiple sequences are moderately motion degraded. BRAIN: No reduced  diffusion to suggest acute ischemia. LEFT posterior frontal lobe susceptibility artifact corresponding to density on today's CT, with hemosiderin ring, no associated edema. Patchy bilateral cerebellar T2 hyperintensities. Old bilateral thalamus lacunar infarcts. Old LEFT pontine infarct. The ventricles and sulci are normal for patient's age. No suspicious parenchymal signal, mass or mass effect. No abnormal extra-axial fluid collections. VASCULAR: Normal major intracranial vascular flow voids present at skull base. SKULL AND UPPER CERVICAL SPINE: No abnormal sellar expansion. No suspicious calvarial bone marrow signal. Craniocervical junction maintained. SINUSES/ORBITS: Posterior ethmoid opacification in mild paranasal sinus mucosal thickening. Mastoid air cells are well aerated. The included ocular globes and orbital contents are non-suspicious. OTHER: None. IMPRESSION: 1. No acute process on this moderately motion degraded examination. 2. 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 3. Multiple old cerebellar infarcts though, given rounded morphology, metastatic disease is possible. Recommend nonemergent follow-up MRI of the brain with contrast when patient is able to remain still. 4. Old lacunar infarcts and moderate chronic small vessel ischemic disease. Electronically Signed   By: Elon Alas M.D.   On: 02/10/2017 19:29    Assessment and Plan:   1. Chest pain: Troponin only borderline in the setting of end-stage renal disease.  Chest pain sometimes can last hours at a time.  Options including stress testing versus cardiac catheterization.  Given the chronic nature of the graft and persistent daily symptoms, it may be more prudent to proceed with invasive study for definitive assessment.  2. End-stage renal disease on hemodialysis Monday Wednesday Friday: He underwent dialysis on Sunday instead due to Christmas.  3. Hypokalemia: Potassium 2.9, Alyx  replete.  4. Hypertensive urgency: Initial blood pressure over 200 on arrival.  According to the patient, his blood pressure chronically running in the 170s at home.  He has been started on metoprolol during this admission.  We Knight continue to uptitrate antihypertensive treatment.  5. DM 2: On insulin  6. PAD s/p bilateral below-knee amputation  For questions or updates, please contact [place] Please consult [email or web address] for contact info under Cardiology/STEMI.   Hilbert Corrigan, [place]  02/11/2017 8:17 AM  Personally seen and examined. Agree with above.  76 year old male with end-stage renal disease on hemodialysis Monday Wednesday Friday with peripheral vascular disease status post below-knee amputation bilaterally with coronary artery disease status post CABG here with escalating symptoms of angina over the past few weeks/days.  Troponin minimally elevated at 0.03.  EKG with sinus rhythm, poor R wave progression, nonspecific ST-T wave changes personally viewed.  Exam reveals pleasant male, comfortable in bed.  Below knee amputations noted.  2+ right groin femoral pulse noted.  Lungs are clear to auscultation bilaterally, heart regular rate and rhythm without any significant murmurs.  Unstable angina -Minimally elevated troponin consistent with end-stage renal disease although troponin was normal previously.  I think given the escalation in his recent symptoms and known coronary disease, bypass and prior nuclear stress test in 2016, it makes sense to pursue diagnostic angiogram to ensure that there is no significant change in either his bypass anatomy or native vessel anatomy.  He is willing to proceed with cardiac catheterization, understands risks and benefits including stroke heart attack death bleeding.  We [facility] pursue this tomorrow. -Echocardiogram currently pending.  PVD -Below-knee  amputations bilaterally.  However he does have a 2+ right femoral artery pulse.  End-stage  renal disease -Hemodialysis per nephrology.  Hypertensive urgency - Blood pressure significant the elevated.  Volume hemodialysis control.  May be playing a role as well and his symptoms.  Candee Furbish, MD

## 2017-02-11 NOTE — Progress Notes (Signed)
  Echocardiogram 2D Echocardiogram has been performed.  Jannett Celestine 02/11/2017, 9:00 AM

## 2017-02-11 NOTE — Evaluation (Signed)
Physical Therapy Evaluation Patient Details Name: Alan [name] MRN: [id number] DOB: 1940/05/11 Today's Date: 02/11/2017   History of Present Illness  Pt is a 76 y.o. male admitted 02/10/17 with c/o chest pain and L-side facial numbness. EKG and CXR unremarkable. MRI brain negative for acute process, notable for old CVA's vs. metastatic disease. Pertinent PMH includes bilateral BKAs, CAD s/p CABG, ESRD on HD, CVA, HTN, DM.     Clinical Impression  Patient evaluated by Physical Therapy with no further acute PT needs identified. PTA, pt lives at home with family who help provide assist with ADLs and transfers to/from power w/c; pt with bilateral BKAs and has LLE prosthetic at home. Pt reports increased difficulty with transfers to/from wheelchair. Today, required supervision for anterior-posterior transfer from bed to chair (RN notified). Recommend HHPT services to maximize functional mobility and independence; pt in agreement with this. All education has been completed and the patient has no further questions. Acute PT is signing off. Thank you for this referral.    Follow Up Recommendations Home health PT;Supervision for mobility/OOB    Equipment Recommendations  None recommended by PT    Recommendations for Other Services       Precautions / Restrictions Precautions Precautions: Fall Restrictions Weight Bearing Restrictions: No      Mobility  Bed Mobility Overal bed mobility: Needs Assistance Bed Mobility: Supine to Sit     Supine to sit: Min assist     General bed mobility comments: MinA for HHA to assist trunk elevation  Transfers Overall transfer level: Needs assistance Equipment used: None Transfers: Comptroller transfers: Supervision   General transfer comment: AP transfers from bed to bedside chair with supervision; increased time and effort secondary to BUE fatigue, but no physical assist required  Ambulation/Gait              General Gait Details: Pt does not have LLE prosthetic at hospital  Stairs            Wheelchair Mobility    Modified [place] (Stroke Patients Only)       Balance Overall balance assessment: Needs assistance Sitting-balance support: Bilateral upper extremity supported;Feet unsupported Sitting balance-Leahy Scale: Fair                                       Pertinent Vitals/Pain Pain Assessment: No/denies pain    Home Living Family/patient expects to be discharged to:: Private residence Living Arrangements: Spouse/significant other Available Help at Discharge: Family;Available 24 hours/day(Wife, son) Type of Home: House Home Access: Ramped entrance     Home Layout: One level Home Equipment: [place] - 2 wheels;Bedside commode;Wheelchair - manual;Wheelchair - power;Grab bars - toilet;Other (comment)(slide board) Additional Comments: Wife recently sick and not able to provide as much assist; son available for assist    Prior Function Level of Independence: Needs assistance   Gait / Transfers Assistance Needed: Has LLE prosthetic, but reliant on power w/c for all mobility. Requires intermittent assist for transfers depending on fatigue levels. Does not drive  ADL's / Homemaking Assistance Needed: Son provides assist for dressing, bathing, toileting. Intermittent assist for bed mob and transfers        Hand Dominance        Extremity/Trunk Assessment   Upper Extremity Assessment Upper Extremity Assessment: Generalized weakness    Lower Extremity Assessment Lower Extremity Assessment: RLE deficits/detail;LLE deficits/detail RLE  Deficits / Details: R BKA; hip and knee grossly 4/5  LLE Deficits / Details: L BKA; hip and knee grossly 4/5       Communication   Communication: No difficulties  Cognition Arousal/Alertness: Awake/alert Behavior During Therapy: WFL for tasks assessed/performed Overall Cognitive Status: Within Functional  Limits for tasks assessed                                        General Comments      Exercises     Assessment/Plan    PT Assessment All further PT needs can be met in the next venue of care  PT Problem List Decreased strength;Decreased mobility       PT Treatment Interventions      PT Goals (Current goals can be found in the Care Plan section)  Acute Rehab PT Goals Patient Stated Goal: Return home PT Goal Formulation: With patient Time For Goal Achievement: 02/25/17 Potential to Achieve Goals: Good    Frequency     Barriers to discharge        Co-evaluation               AM-PAC PT "6 Clicks" Daily Activity  Outcome Measure Difficulty turning over in bed (including adjusting bedclothes, sheets and blankets)?: A Little Difficulty moving from lying on back to sitting on the side of the bed? : Unable Difficulty sitting down on and standing up from a chair with arms (e.g., wheelchair, bedside commode, etc,.)?: Unable Help needed moving to and from a bed to chair (including a wheelchair)?: A Little Help needed walking in hospital room?: Total Help needed climbing 3-5 steps with a railing? : Total 6 Click Score: 10    End of Session   Activity Tolerance: Patient tolerated treatment well Patient left: in chair;with call bell/phone within reach Nurse Communication: Mobility status PT Visit Diagnosis: Other abnormalities of gait and mobility (R26.89)    Time: 3125-0871 PT Time Calculation (min) (ACUTE ONLY): 12 min   Charges:   PT Evaluation $PT Eval Moderate Complexity: 1 Mod     PT G Codes:   PT G-Codes **NOT FOR INPATIENT CLASS** Functional Assessment Tool Used: AM-PAC 6 Clicks Basic Mobility Functional Limitation: Mobility: Walking and moving around Mobility: Walking and Moving Around Current Status (X9412): At least 60 percent but less than 80 percent impaired, limited or restricted Mobility: Walking and Moving Around Goal Status  [phone]): At least 60 percent but less than 80 percent impaired, limited or restricted Mobility: Walking and Moving Around Discharge Status [phone]): At least 60 percent but less than 80 percent impaired, limited or restricted   Mabeline Caras, PT, DPT Acute Rehab Services  Pager: [place] 02/11/2017, 4:07 PM

## 2017-02-11 NOTE — Progress Notes (Signed)
Pt seen and examined.  Full note to follow tomorrow.    HD orders written for tomorrow.    HD MWF [facility]  4h   Hep 1000 per hr   LFA AVF   Kelly Splinter MD [facility] pgr [phone]   02/11/2017, 3:25 PM

## 2017-02-11 NOTE — Progress Notes (Signed)
[facility] Progress Note  Patient: Alan [name] LZJ:[id number]   PCP: [facility] DOB: 02-Jan-1941   DOA: 02/10/2017   DOS: 02/11/2017   Date of Service: the patient was seen and examined on 02/11/2017  Subjective: Feeling better, no chest pain.  No nausea no vomiting.  No fever no chills.  Brief hospital course: Pt. with PMH of  CAD status post remote CABG, end-stage renal disease, history of CVA, hypertension, and insulin-dependent diabetes mellitus; admitted on 02/10/2017, presented with complaint of chest pain, was found to have unstable angina. Currently further plan is follow up on result of cardiac cath.  Assessment and Plan: 1. Chest pain; CAD  - Pt presents with left-sided chest pain at rest, has known CAD s/p remote CABG and stenting - EKG and CXR unremarkable, initial troponin 0.03  - Treated with ASA 324, started on Lopressor in setting of hypertension, continue cardiac monitoring, -Echocardiogram shows preserved EF,  akinesis of the basalinferoseptal myocardium  2. Left facial numbness - Pt presents with left facial numbness  - MRI brain negative for acute process, notable for old CVA's vs metastatic disease - Continue neuro checks, PT/OT eval, aspirin, glycemic-control, supportive care  3. ESRD  - Dialyzed without incident yesterday  - No acidosis, uremia, hyperkalemia, or significant volume excess on admission; HTN improved with Lopressor   4. Hypertension with hypertensive urgency  - BP elevated to 200/70 range in ED  - Start Lopressor in setting of CAD with chest pain    5. Insulin-dependent DM  - A1c was only 5.3% last year, 6.7 right now. - Managed at home with Lantus 8-10 units qHS  - Check CBG's, continue Lantus and start a low-intensity SSI  6. Hypokalemia  hypomagnesemia - Serum potassium is 2.9 on admission  - Treated with 20 mEq oral potassium  now better.  Diet: renal diet DVT Prophylaxis: subcutaneous  Heparin  Advance goals of care discussion: full code  Family Communication: no family was present at bedside, at the time of interview.  Disposition:  Discharge to home.  Consultants: nephrology, cardiolog Procedures: Echocardiogram   Antibiotics: Anti-infectives (From admission, onward)   None     Objective: Physical Exam: Vitals:   02/11/17 0459 02/11/17 0749 02/11/17 1022 02/11/17 1207  BP: (!) 172/63 (!) 157/55 (!) 117/56 (!) 172/63  Pulse:  (!) 53 61 (!) 55  Resp: 17 14  15   Temp:  98.3 F (36.8 C)  97.8 F (36.6 C)  TempSrc:  Oral  Oral  SpO2:  99%  100%  Weight:        Intake/Output Summary (Last 24 hours) at 02/11/2017 1340 Last data filed at 02/11/2017 0800 Gross per 24 hour  Intake 290 ml  Output 380 ml  Net -90 ml   Filed Weights   02/10/17 1540 02/11/17 0132  Weight: 73 kg (161 lb) 76.4 kg (168 lb 6.9 oz)   General: Alert, Awake and Oriented to Time, Place and Person. Appear in mild distress, affect appropriate Eyes: PERRL, Conjunctiva normal ENT: Oral Mucosa clear moist. Neck: no JVD, no Abnormal Mass Or lumps Cardiovascular: S1 and S2 Present, no Murmur, Peripheral Pulses Present Respiratory: normal respiratory effort, Bilateral Air entry equal and Decreased, no use of accessory muscle, Clear to Auscultation, no Crackles, no wheezes Abdomen: Bowel Sound present, Soft and no tenderness, no hernia Skin: no redness, no Rash, no induration Extremities: no Pedal edema, no calf tenderness Neurologic: Grossly no focal neuro deficit. Bilaterally Equal motor strength  Data Reviewed: CBC: Recent Labs  Lab 02/10/17 1554 02/11/17 0940  WBC 8.1 6.9  NEUTROABS 4.3  --   HGB 11.2* 9.4*  HCT 34.1* 28.2*  MCV 93.2 90.7  PLT 209 518   Basic Metabolic Panel: Recent Labs  Lab 02/10/17 1554 02/10/17 2048 02/11/17 0809  NA 136  --  134*  K 2.9*  --  3.3*  CL 96*  --  98*  CO2 26  --  27  GLUCOSE 245*  --  85  BUN 37*  --  44*  CREATININE 3.48*  --   4.57*  CALCIUM 8.1*  --  7.7*  MG  --  1.6* 1.8    Liver Function Tests: Recent Labs  Lab 02/11/17 0809  AST 15  ALT 11*  ALKPHOS 49  BILITOT 0.8  PROT 5.4*  ALBUMIN 2.3*   No results for input(s): LIPASE, AMYLASE in the last 168 hours. No results for input(s): AMMONIA in the last 168 hours. Coagulation Profile: Recent Labs  Lab 02/10/17 1554  INR 0.99   Cardiac Enzymes: Recent Labs  Lab 02/10/17 1554 02/10/17 2048 02/11/17 0809  TROPONINI 0.03* 0.03* <0.03   BNP (last 3 results) No results for input(s): PROBNP in the last 8760 hours. CBG: Recent Labs  Lab 02/10/17 2203 02/11/17 0749 02/11/17 1207  GLUCAP 152* 93 94   Studies: Dg Chest 2 View  Result Date: 02/10/2017 CLINICAL DATA:  Chest pain. EXAM: CHEST  2 VIEW COMPARISON:  Radiograph of June 15, 2016. FINDINGS: Stable cardiomediastinal silhouette. No pneumothorax or pleural effusion is noted. Stable bilateral midlung scarring is noted. Stable elevated left hemidiaphragm is noted. No acute pulmonary disease is noted. Bony thorax is unremarkable. IMPRESSION: Stable bilateral midlung scarring.  No acute abnormality seen. Electronically Signed   By: Marijo Conception, M.D.   On: 02/10/2017 16:27   Ct Head Wo Contrast  Result Date: 02/10/2017 CLINICAL DATA:  Left-sided numbness. EXAM: CT HEAD WITHOUT CONTRAST TECHNIQUE: Contiguous axial images were obtained from the base of the skull through the vertex without intravenous contrast. COMPARISON:  None. FINDINGS: Brain: Mild chronic ischemic white matter disease is noted. No mass effect or midline shift is noted. Focal high density is seen in superior portion of left parietal cortex concerning for intraparenchymal hemorrhage or contusion. No mass lesion or acute infarction is noted. Vascular: No hyperdense vessel or unexpected calcification. Skull: Normal. Negative for fracture or focal lesion. Sinuses/Orbits: Mild bilateral ethmoid sinusitis is noted. Other: None.  IMPRESSION: Mild chronic ischemic white matter disease. Probable focal intraparenchymal hemorrhage or contusion seen involving superior portion of left parietal cortex. MRI is recommended for further evaluation. Critical Value/emergent results were called by telephone at the time of interpretation on 02/10/2017 at 4:40 pm to Dr. Davonna Belling , who verbally acknowledged these results. Electronically Signed   By: Marijo Conception, M.D.   On: 02/10/2017 16:40   Mr Brain Wo Contrast  Result Date: 02/10/2017 CLINICAL DATA:  LEFT facial numbness and chest pain today. History of stroke, end-stage renal disease on dialysis, diabetes, hypertension, hyperlipidemia. EXAM: MRI HEAD WITHOUT CONTRAST TECHNIQUE: Multiplanar, multiecho pulse sequences of the brain and surrounding structures were obtained without intravenous contrast. COMPARISON:  CT HEAD February 10, 2017 at 1617 hours FINDINGS: Multiple sequences are moderately motion degraded. BRAIN: No reduced diffusion to suggest acute ischemia. LEFT posterior frontal lobe susceptibility artifact corresponding to density on today's CT, with hemosiderin ring, no associated edema. Patchy bilateral cerebellar T2 hyperintensities. Old bilateral thalamus lacunar  infarcts. Old LEFT pontine infarct. The ventricles and sulci are normal for patient's age. No suspicious parenchymal signal, mass or mass effect. No abnormal extra-axial fluid collections. VASCULAR: Normal major intracranial vascular flow voids present at skull base. SKULL AND UPPER CERVICAL SPINE: No abnormal sellar expansion. No suspicious calvarial bone marrow signal. Craniocervical junction maintained. SINUSES/ORBITS: Posterior ethmoid opacification in mild paranasal sinus mucosal thickening. Mastoid air cells are well aerated. The included ocular globes and orbital contents are non-suspicious. OTHER: None. IMPRESSION: 1. No acute process on this moderately motion degraded examination. 2. 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 3. Multiple old cerebellar infarcts though, given rounded morphology, metastatic disease is possible. Recommend nonemergent follow-up MRI of the brain with contrast when patient is able to remain still. 4. Old lacunar infarcts and moderate chronic small vessel ischemic disease. Electronically Signed   By: Elon Alas M.D.   On: 02/10/2017 19:29    Scheduled Meds: .  stroke: mapping our early stages of recovery book   Does not apply Once  . aspirin EC  81 mg Oral QHS  . cinacalcet  30 mg Oral QHS  . diphenhydrAMINE  25 mg Oral QHS  . heparin  5,000 Units Subcutaneous Q8H  . [START ON 02/12/2017] Influenza vac split quadrivalent PF  0.5 mL Intramuscular Tomorrow-1000  . insulin aspart  0-5 Units Subcutaneous QHS  . insulin aspart  0-9 Units Subcutaneous TID WC  . insulin glargine  8 Units Subcutaneous QHS  . metoprolol tartrate  25 mg Oral BID  . multivitamin  1 tablet Oral QPM  . pantoprazole  40 mg Oral Daily  . [START ON 02/12/2017] pneumococcal 23 valent vaccine  0.5 mL Intramuscular Tomorrow-1000   Continuous Infusions: PRN Meds: acetaminophen, albuterol, morphine injection, nitroGLYCERIN, ondansetron (ZOFRAN) IV  Time spent: 35 minutes  Author: Berle Mull, MD [facility] Pager: [phone] 02/11/2017 1:40 PM  If 7PM-7AM, please contact night-coverage at [email or web address], password [facility]

## 2017-02-12 ENCOUNTER — Other Ambulatory Visit: Payer: Self-pay

## 2017-02-12 ENCOUNTER — Encounter (HOSPITAL_COMMUNITY)
Admission: EM | Disposition: A | Discharge status: Home / Self Care | Payer: Self-pay | Source: Home / Self Care | Attending: Internal Medicine

## 2017-02-12 DIAGNOSIS — I25700 Atherosclerosis of coronary artery bypass graft(s), unspecified, with unstable angina pectoris: Secondary | ICD-10-CM

## 2017-02-12 HISTORY — PX: LEFT HEART CATH AND CORS/GRAFTS ANGIOGRAPHY: CATH118250

## 2017-02-12 LAB — GLUCOSE, CAPILLARY
GLUCOSE-CAPILLARY: 53 mg/dL — AB (ref 65–99)
GLUCOSE-CAPILLARY: 54 mg/dL — AB (ref 65–99)
GLUCOSE-CAPILLARY: 55 mg/dL — AB (ref 65–99)
Glucose-Capillary: 142 mg/dL — ABNORMAL HIGH (ref 65–99)
Glucose-Capillary: 83 mg/dL (ref 65–99)
Glucose-Capillary: 115 mg/dL — ABNORMAL HIGH (ref 65–99)

## 2017-02-12 LAB — CBC
HEMATOCRIT: 29 % — AB (ref 39.0–52.0)
Hemoglobin: 9.7 g/dL — ABNORMAL LOW (ref 13.0–17.0)
MCH: 30.4 pg (ref 26.0–34.0)
MCHC: 33.4 g/dL (ref 30.0–36.0)
MCV: 90.9 fL (ref 78.0–100.0)
Platelets: 164 10*3/uL (ref 150–400)
RBC: 3.19 MIL/uL — AB (ref 4.22–5.81)
RDW: 13.4 % (ref 11.5–15.5)
WBC: 6.3 10*3/uL (ref 4.0–10.5)

## 2017-02-12 LAB — RENAL FUNCTION PANEL
ALBUMIN: 2.2 g/dL — AB (ref 3.5–5.0)
ANION GAP: 9 (ref 5–15)
BUN: 51 mg/dL — AB (ref 6–20)
CO2: 27 mmol/L (ref 22–32)
Calcium: 7.6 mg/dL — ABNORMAL LOW (ref 8.9–10.3)
Chloride: 97 mmol/L — ABNORMAL LOW (ref 101–111)
Creatinine, Ser: 5.58 mg/dL — ABNORMAL HIGH (ref 0.61–1.24)
GFR, EST AFRICAN AMERICAN: 10 mL/min — AB (ref >60)
GFR, EST NON AFRICAN AMERICAN: 9 mL/min — AB (ref >60)
Glucose, Bld: 106 mg/dL — ABNORMAL HIGH (ref 65–99)
PHOSPHORUS: 4.8 mg/dL — AB (ref 2.5–4.6)
POTASSIUM: 3.3 mmol/L — AB (ref 3.5–5.1)
Sodium: 133 mmol/L — ABNORMAL LOW (ref 135–145)

## 2017-02-12 LAB — POCT ACTIVATED CLOTTING TIME: Activated Clotting Time: 109 seconds

## 2017-02-12 LAB — MAGNESIUM: Magnesium: 1.9 mg/dL (ref 1.7–2.4)

## 2017-02-12 SURGERY — LEFT HEART CATH AND CORS/GRAFTS ANGIOGRAPHY
Anesthesia: LOCAL

## 2017-02-12 MED ORDER — LIDOCAINE HCL (PF) 1 % IJ SOLN
INTRAMUSCULAR | Status: AC
  Filled 2017-02-12: qty 30

## 2017-02-12 MED ORDER — IOPAMIDOL (ISOVUE-370) INJECTION 76%
INTRAVENOUS | Status: AC
  Filled 2017-02-12: qty 100

## 2017-02-12 MED ORDER — SODIUM CHLORIDE 0.9 % IV SOLN: 100.0000 mL | INTRAVENOUS | Status: DC | PRN

## 2017-02-12 MED ORDER — PENTAFLUOROPROP-TETRAFLUOROETH EX AERO: 1.0000 "application " | INHALATION_SPRAY | CUTANEOUS | Status: DC | PRN

## 2017-02-12 MED ORDER — HEPARIN (PORCINE) IN NACL 2-0.9 UNIT/ML-% IJ SOLN
INTRAMUSCULAR | Status: AC | PRN
  Administered 2017-02-12: 1000 mL

## 2017-02-12 MED ORDER — HYDRALAZINE HCL 20 MG/ML IJ SOLN
INTRAMUSCULAR | Status: DC | PRN
  Administered 2017-02-12: 10 mg via INTRAVENOUS

## 2017-02-12 MED ORDER — ALTEPLASE 2 MG IJ SOLR
2.0000 mg | Freq: Once | INTRAMUSCULAR | Status: DC | PRN
Start: 2017-02-12 — End: 2017-02-12

## 2017-02-12 MED ORDER — SODIUM CHLORIDE 0.9 % IV SOLN
INTRAVENOUS | Status: AC
  Administered 2017-02-12: 18:00:00 via INTRAVENOUS

## 2017-02-12 MED ORDER — SODIUM CHLORIDE 0.9% FLUSH: 3.0000 mL | INTRAVENOUS | Status: DC | PRN

## 2017-02-12 MED ORDER — ACETAMINOPHEN 325 MG PO TABS: 650.0000 mg | ORAL_TABLET | ORAL | Status: DC | PRN

## 2017-02-12 MED ORDER — LIDOCAINE-PRILOCAINE 2.5-2.5 % EX CREA: 1.0000 "application " | TOPICAL_CREAM | CUTANEOUS | Status: DC | PRN

## 2017-02-12 MED ORDER — HEPARIN SODIUM (PORCINE) 1000 UNIT/ML DIALYSIS: 1000.0000 [IU] | INTRAMUSCULAR | Status: DC | PRN

## 2017-02-12 MED ORDER — SODIUM CHLORIDE 0.9% FLUSH
3.0000 mL | Freq: Two times a day (BID) | INTRAVENOUS | Status: DC
  Administered 2017-02-12 – 2017-02-13 (×2): 3 mL via INTRAVENOUS

## 2017-02-12 MED ORDER — LIDOCAINE HCL (PF) 1 % IJ SOLN
INTRAMUSCULAR | Status: DC | PRN
  Administered 2017-02-12: 16 mL

## 2017-02-12 MED ORDER — HYDRALAZINE HCL 20 MG/ML IJ SOLN
INTRAMUSCULAR | Status: AC
  Filled 2017-02-12: qty 1

## 2017-02-12 MED ORDER — ONDANSETRON HCL 4 MG/2ML IJ SOLN: 4.0000 mg | Freq: Four times a day (QID) | INTRAMUSCULAR | Status: DC | PRN

## 2017-02-12 MED ORDER — DEXTROSE 50 % IV SOLN
INTRAVENOUS | Status: AC
  Administered 2017-02-12: 50 mL
  Filled 2017-02-12: qty 50

## 2017-02-12 MED ORDER — HEPARIN (PORCINE) IN NACL 2-0.9 UNIT/ML-% IJ SOLN
INTRAMUSCULAR | Status: AC
  Filled 2017-02-12: qty 1000

## 2017-02-12 MED ORDER — IOPAMIDOL (ISOVUE-370) INJECTION 76%
INTRAVENOUS | Status: DC | PRN
  Administered 2017-02-12: 85 mL via INTRA_ARTERIAL

## 2017-02-12 MED ORDER — SODIUM CHLORIDE 0.9 % IV SOLN: 250.0000 mL | INTRAVENOUS | Status: DC | PRN

## 2017-02-12 MED ORDER — LIDOCAINE HCL (PF) 1 % IJ SOLN: 5.0000 mL | INTRAMUSCULAR | Status: DC | PRN

## 2017-02-12 MED ORDER — HYDRALAZINE HCL 20 MG/ML IJ SOLN: 10.0000 mg | INTRAMUSCULAR | Status: DC | PRN

## 2017-02-12 MED ORDER — ASPIRIN 81 MG PO CHEW: 81.0000 mg | CHEWABLE_TABLET | Freq: Every day | ORAL | Status: DC

## 2017-02-12 SURGICAL SUPPLY — 9 items
CATH INFINITI 5FR MULTPACK ANG (CATHETERS) ×2 IMPLANT
KIT HEART LEFT (KITS) ×2 IMPLANT
PACK CARDIAC CATHETERIZATION (CUSTOM PROCEDURE TRAY) ×2 IMPLANT
SHEATH PINNACLE 5F 10CM (SHEATH) ×2 IMPLANT
SYR MEDRAD MARK V 150ML (SYRINGE) ×2 IMPLANT
TRANSDUCER W/STOPCOCK (MISCELLANEOUS) ×2 IMPLANT
TUBING CIL FLEX 10 FLL-RA (TUBING) ×2 IMPLANT
WIRE EMERALD 3MM-J .035X150CM (WIRE) ×2 IMPLANT
WIRE HI TORQ VERSACORE-J 145CM (WIRE) ×2 IMPLANT

## 2017-02-12 NOTE — Evaluation (Signed)
Speech Language Pathology Evaluation Patient Details Name: Alan [name] MRN: [id number] DOB: 1940/04/08 Today's Date: 02/12/2017 Time: 7342-8768 SLP Time Calculation (min) (ACUTE ONLY): 8 min  Problem List:  Patient Active Problem List   Diagnosis Date Noted  . Left sided numbness 02/10/2017  . Hypertensive urgency 02/10/2017  . Hypokalemia 02/10/2017  . Chest pain 06/15/2016  . Renal dialysis device, implant, or graft complication 11/57/2620  . Septic shock ([place]) 08/28/2015  . 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 Lower urinary tract infectious disease   . Essential hypertension 08/13/2015  . Nausea and vomiting 08/13/2015  . ESRD (end stage renal disease) ([place]) 04/12/2015  . Gastroenteritis 04/12/2015  . S/P bilateral BKA (below knee amputation) ([place]) 04/12/2015  . HLD (hyperlipidemia) 04/12/2015  . Acute chest pain 11/16/2014  . Atherosclerosis of native arteries of the extremities with gangrene ([place]) 04/20/2013  . Sepsis ([place]) 04/13/2013  . Gram-negative bacteremia 04/12/2013  . ESRD on dialysis ([place]) 04/09/2013  . 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 Protein-calorie malnutrition, severe ([place]) 04/09/2013  . Gangrene of toe ([place]) 04/08/2013  . Toe gangrene ([place]) 04/08/2013  . Nausea with vomiting 05/22/2012  . S/P BKA (below knee amputation) unilateral, left 05/20/2012  . Musculoskeletal chest pain 05/20/2012  . Acute renal failure ([place]) 05/19/2012  . CAD (coronary artery disease) 05/19/2012  . DM type 2 causing vascular disease ([place]) 05/19/2012  . Hyperkalemia 05/19/2012   Past Medical History:  Past Medical History:  Diagnosis Date  . Arthritis   . Asthma   . Clotted renal dialysis arteriovenous graft ([place])   . Coronary artery disease 2000   Stents [facility]  . CVA (cerebral infarction)   . DDD (degenerative disc disease), cervical   . DDD (degenerative disc disease), lumbar   . Diabetes mellitus   . ESRD (end stage renal disease) on dialysis ([place])   .  Gangrene of toe ([place]) Feb. 2015   Right  great    . Gastric ulcer    EGD 2014  . Gout   . Hypercholesteremia   . Hypertension   . Stroke ([place])   . 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 Thrombosis of renal dialysis arteriovenous graft [facility])    Past Surgical History:  Past Surgical History:  Procedure Laterality Date  . A/V FISTULAGRAM Left 05/28/2016   Procedure: A/V Fistulagram;  Surgeon: Katha Cabal, MD;  Location: [place] [facility];  Service: Cardiovascular;  Laterality: Left;  . A/V FISTULAGRAM Left 10/28/2016   Procedure: A/V Fistulagram;  Surgeon: Algernon Huxley, MD;  Location: [place] [facility];  Service: Cardiovascular;  Laterality: Left;  . A/V SHUNT INTERVENTION N/A 05/28/2016   Procedure: A/V Shunt Intervention;  Surgeon: Katha Cabal, MD;  Location: [place] [facility];  Service: Cardiovascular;  Laterality: N/A;  . AMPUTATION Right 04/10/2013   Procedure: Right Below Knee Amputation;  Surgeon: Newt Minion, MD;  Location: [place];  Service: Orthopedics;  Laterality: Right;  Right Below Knee Amputation  . APPENDECTOMY    . BELOW KNEE LEG AMPUTATION Bilateral   . CHOLECYSTECTOMY    . CORONARY ARTERY BYPASS GRAFT  1996   [place]  . ESOPHAGOGASTRODUODENOSCOPY Left 05/24/2012   BTD:HRCBULAG dilated baggy but otherwise a normal/ Small hiatal hernia. Gastric ulcer -S/P biopsy  . PERIPHERAL VASCULAR CATHETERIZATION N/A 08/09/2014   Procedure: A/V Shuntogram/Fistulagram;  Surgeon: Katha Cabal, MD;  Location: [place] [facility];  Service: Cardiovascular;  Laterality: N/A;  . PERIPHERAL VASCULAR CATHETERIZATION Left 08/09/2014   Procedure: A/V Shunt  Intervention;  Surgeon: Katha Cabal, MD;  Location: [place] [facility];  Service: Cardiovascular;  Laterality: Left;   HPI:  76 y.o.malewith medical history significant forCAD status post remote CABG, end-stage renal disease, history of CVA, hypertension, and insulin-dependent diabetes mellitus, now presenting  to the emergency department for evaluation of chest pain and left-sided facial numbness. MRI of head without acute intracranial abnormalities though remote infarcts noted.    Assessment / Plan / Recommendation Clinical Impression  Pt presents at baseline for cognitive linguistics. MRI of head without acute intracranial abnormalities though remote infarcts noted. PLOF pt lives with spouse who assists with ADLs basic and complex. Pt reports hx of memory impairments that do not interfere with his daily living skills. Receptive and expressive language skills appear intact. No acute ST needs identified. ST to sign off.      SLP Assessment    Informal cognitive linguistic assessment    Follow Up Recommendations   24 hour care at home   Frequency and Duration   NA        SLP Evaluation Cognition  Overall Cognitive Status: History of cognitive impairments - at baseline Orientation Level: Oriented X4 Memory: (baseline deficits including decreased novel recall )       Comprehension  Auditory Comprehension Overall Auditory Comprehension: Appears within functional limits for tasks assessed Visual Recognition/Discrimination Discrimination: Within Function Limits    Expression Expression Primary Mode of Expression: Verbal Verbal Expression Overall Verbal Expression: Appears within functional limits for tasks assessed Written Expression Dominant Hand: Right   Oral / Motor  Oral Motor/Sensory Function Overall Oral Motor/Sensory Function: Within functional limits Motor Speech Overall Motor Speech: Appears within functional limits for tasks assessed   GO                   Jannette Spanner MA, CCC-SLP Acute Care Speech Language Pathologist    Kallie Depolo E Hayk Divis 02/12/2017, 4:38 PM

## 2017-02-12 NOTE — Progress Notes (Signed)
The patient has been seen in conjunction with Cecilie Kicks, NP-C. All aspects of care have been considered and discussed. The patient has been personally interviewed, examined, and all clinical data has been reviewed.   Currently on dialysis.  Asymptomatic.  Coronary angiography later today.  This Emillio help define anatomy.  Further management Lam[facility] be based upon findings.  Progress Note  Patient Name: Alan [name] Date of Encounter: 02/12/2017  Primary Cardiologist: Dr. Harl Bowie  Subjective   No chest pain, no SOB now in dialysis   Inpatient Medications    Scheduled Meds: .  stroke: mapping our early stages of recovery book   Does not apply Once  . aspirin EC  81 mg Oral QHS  . cinacalcet  30 mg Oral QHS  . diphenhydrAMINE  25 mg Oral QHS  . heparin  5,000 Units Subcutaneous Q8H  . Influenza vac split quadrivalent PF  0.5 mL Intramuscular Tomorrow-1000  . insulin aspart  0-5 Units Subcutaneous QHS  . insulin aspart  0-9 Units Subcutaneous TID WC  . insulin glargine  8 Units Subcutaneous QHS  . metoprolol tartrate  25 mg Oral BID  . multivitamin  1 tablet Oral QPM  . pantoprazole  40 mg Oral Daily  . pneumococcal 23 valent vaccine  0.5 mL Intramuscular Tomorrow-1000  . sodium chloride flush  3 mL Intravenous Q12H   Continuous Infusions: . sodium chloride    . sodium chloride 10 mL/hr at 02/12/17 6256   PRN Meds: sodium chloride, acetaminophen, albuterol, morphine injection, nitroGLYCERIN, ondansetron (ZOFRAN) IV, sodium chloride flush   Vital Signs    Vitals:   02/12/17 0010 02/12/17 0045 02/12/17 0420 02/12/17 0600  BP: (!) 181/69 (!) 148/53 (!) 171/60 (!) 162/63  Pulse:      Resp: 14 12 18 14   Temp: 98.1 F (36.7 C)  98 F (36.7 C)   TempSrc: Oral  Oral   SpO2: 100%  100% 99%  Weight:   167 lb 8.8 oz (76 kg)   Height:        Intake/Output Summary (Last 24 hours) at 02/12/2017 0703 Last data filed at 02/12/2017 0400 Gross per 24 hour  Intake 840 ml    Output 650 ml  Net 190 ml   Filed Weights   02/10/17 1540 02/11/17 0132 02/12/17 0420  Weight: 161 lb (73 kg) 168 lb 6.9 oz (76.4 kg) 167 lb 8.8 oz (76 kg)    Telemetry    SR to SB - Personally Reviewed  ECG    SB with 1st degree AV block  - Personally Reviewed  Physical Exam   GEN: No acute distress.   Neck: No JVD Cardiac: RRR, no murmurs, rubs, or gallops.  Respiratory: Clear to auscultation bilaterally. GI: Soft, nontender, non-distended  MS: No edema; No deformity. Neuro:  Nonfocal  Psych: Normal affect   Labs    Chemistry Recent Labs  Lab 02/10/17 1554 02/11/17 0809 02/12/17 0437  NA 136 134* 133*  K 2.9* 3.3* 3.3*  CL 96* 98* 97*  CO2 26 27 27   GLUCOSE 245* 85 106*  BUN 37* 44* 51*  CREATININE 3.48* 4.57* 5.58*  CALCIUM 8.1* 7.7* 7.6*  PROT  --  5.4*  --   ALBUMIN  --  2.3* 2.2*  AST  --  15  --   ALT  --  11*  --   ALKPHOS  --  49  --   BILITOT  --  0.8  --   GFRNONAA 15* 11*  9*  GFRAA 18* 13* 10*  ANIONGAP 14 9 9      Hematology Recent Labs  Lab 02/10/17 1554 02/11/17 0940 02/12/17 0437  WBC 8.1 6.9 6.3  RBC 3.66* 3.11* 3.19*  HGB 11.2* 9.4* 9.7*  HCT 34.1* 28.2* 29.0*  MCV 93.2 90.7 90.9  MCH 30.6 30.2 30.4  MCHC 32.8 33.3 33.4  RDW 13.4 13.3 13.4  PLT 209 173 164    Cardiac Enzymes Recent Labs  Lab 02/10/17 2048 02/11/17 0809 02/11/17 1336 02/11/17 1940  TROPONINI 0.03* <0.03 <0.03 <0.03   No results for input(s): TROPIPOC in the last 168 hours.   BNPNo results for input(s): BNP, PROBNP in the last 168 hours.   DDimer No results for input(s): DDIMER in the last 168 hours.   Radiology    Dg Chest 2 View  Result Date: 02/10/2017 CLINICAL DATA:  Chest pain. EXAM: CHEST  2 VIEW COMPARISON:  Radiograph of June 15, 2016. FINDINGS: Stable cardiomediastinal silhouette. No pneumothorax or pleural effusion is noted. Stable bilateral midlung scarring is noted. Stable elevated left hemidiaphragm is noted. No acute pulmonary  disease is noted. Bony thorax is unremarkable. IMPRESSION: Stable bilateral midlung scarring.  No acute abnormality seen. Electronically Signed   By: Marijo Conception, M.D.   On: 02/10/2017 16:27   Ct Head Wo Contrast  Result Date: 02/10/2017 CLINICAL DATA:  Left-sided numbness. EXAM: CT HEAD WITHOUT CONTRAST TECHNIQUE: Contiguous axial images were obtained from the base of the skull through the vertex without intravenous contrast. COMPARISON:  None. FINDINGS: Brain: Mild chronic ischemic white matter disease is noted. No mass effect or midline shift is noted. Focal high density is seen in superior portion of left parietal cortex concerning for intraparenchymal hemorrhage or contusion. No mass lesion or acute infarction is noted. Vascular: No hyperdense vessel or unexpected calcification. Skull: Normal. Negative for fracture or focal lesion. Sinuses/Orbits: Mild bilateral ethmoid sinusitis is noted. Other: None. IMPRESSION: Mild chronic ischemic white matter disease. Probable focal intraparenchymal hemorrhage or contusion seen involving superior portion of left parietal cortex. MRI is recommended for further evaluation. Critical Value/emergent results were called by telephone at the time of interpretation on 02/10/2017 at 4:40 pm to Dr. Davonna Belling , who verbally acknowledged these results. Electronically Signed   By: Marijo Conception, M.D.   On: 02/10/2017 16:40   Mr Brain Wo Contrast  Result Date: 02/10/2017 CLINICAL DATA:  LEFT facial numbness and chest pain today. History of stroke, end-stage renal disease on dialysis, diabetes, hypertension, hyperlipidemia. EXAM: MRI HEAD WITHOUT CONTRAST TECHNIQUE: Multiplanar, multiecho pulse sequences of the brain and surrounding structures were obtained without intravenous contrast. COMPARISON:  CT HEAD February 10, 2017 at 1617 hours FINDINGS: Multiple sequences are moderately motion degraded. BRAIN: No reduced diffusion to suggest acute ischemia. LEFT  posterior frontal lobe susceptibility artifact corresponding to density on today's CT, with hemosiderin ring, no associated edema. Patchy bilateral cerebellar T2 hyperintensities. Old bilateral thalamus lacunar infarcts. Old LEFT pontine infarct. The ventricles and sulci are normal for patient's age. No suspicious parenchymal signal, mass or mass effect. No abnormal extra-axial fluid collections. VASCULAR: Normal major intracranial vascular flow voids present at skull base. SKULL AND UPPER CERVICAL SPINE: No abnormal sellar expansion. No suspicious calvarial bone marrow signal. Craniocervical junction maintained. SINUSES/ORBITS: Posterior ethmoid opacification in mild paranasal sinus mucosal thickening. Mastoid air cells are well aerated. The included ocular globes and orbital contents are non-suspicious. OTHER: None. IMPRESSION: 1. No acute process on this moderately motion degraded examination. 2.  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 3. Multiple old cerebellar infarcts though, given rounded morphology, metastatic disease is possible. Recommend nonemergent follow-up MRI of the brain with contrast when patient is able to remain still. 4. Old lacunar infarcts and moderate chronic small vessel ischemic disease. Electronically Signed   By: Elon Alas M.D.   On: 02/10/2017 19:29    Cardiac Studies   02/11/17 Echo Study Conclusions  - Left ventricle: The cavity size was normal. There was mild   concentric hypertrophy. Systolic function was normal. The   estimated ejection fraction was in the range of 55% to 60%. There   is akinesis of the basalinferoseptal myocardium. Features are   consistent with a pseudonormal left ventricular filling pattern,   with concomitant abnormal relaxation and increased filling   pressure (grade 2 diastolic dysfunction). - Aortic valve: Trileaflet; moderately thickened, moderately   calcified leaflets. - Left  atrium: Volume/bsa, S: 31.9 ml/m^2.  Patient Profile     76 y.o. male  with a hx of CAD s/p CABG, ESRD on HD MWF, history of CVA, PAD s/p bilateral below knee amputation, hypertension and DM 2 on insulin    Assessment & Plan    [place] Angina  Very mildly elevated troponin in pt with ESRD on HD  With increased symptoms proceeding with cardiac cath today after dialysis.   CAD with hx of CABG with increasing episodes of angina.   ESRD with HD on MWF going today  Hypokalemia - 3.3 today but going to dialysis  HTN urgency on admit with systolic > 559 and was running 170s at home--today 148/53 to 171/60   DM-2 per IM   PAD with hx bil BKA  Stable.    For questions or updates, please contact [place] Please consult [email or web address] for contact info under Cardiology/STEMI.      Signed, Cecilie Kicks, NP  02/12/2017, 7:03 AM

## 2017-02-12 NOTE — Interval H&P Note (Signed)
Cath Lab Visit (complete for each Cath Lab visit)  Clinical Evaluation Leading to the Procedure:   ACS: Yes.    Non-ACS:    Anginal Classification: CCS III  Anti-ischemic medical therapy: Minimal Therapy (1 class of medications)  Non-Invasive Test Results: No non-invasive testing performed  Prior CABG: Previous CABG      History and Physical Interval Note:  02/12/2017 1:58 PM  Alan [name]  has presented today for surgery, with the diagnosis of unstable angina  The various methods of treatment have been discussed with the patient and family. After consideration of risks, benefits and other options for treatment, the patient has consented to  Procedure(s): LEFT HEART CATH AND CORS/GRAFTS ANGIOGRAPHY (N/A) as a surgical intervention .  The patient's history has been reviewed, patient examined, no change in status, stable for surgery.  I have reviewed the patient's chart and labs.  Questions were answered to the patient's satisfaction.     Quay Burow

## 2017-02-12 NOTE — Progress Notes (Addendum)
Site area: RFA Site Prior to Removal:  Level 0 Pressure Applied For: Manual:   Yes min Patient Status During Pull: stable  Post Pull Site:  Level Post Pull Instructions Given:  yes Post Pull Pulses Present:  Dressing Applied:  tegaderm Bedrest begins @ 9794 till 1930 Comments:

## 2017-02-12 NOTE — Progress Notes (Signed)
[facility] Progress Note  Patient: Alan [name] WUJ:[id number]   PCP: [facility] DOB: 09-29-1940   DOA: 02/10/2017   DOS: 02/12/2017   Date of Service: the patient was seen and examined on 02/12/2017  Subjective: Continues to feel better, no chest pain no nausea no vomiting no fever no chills.  No acute events overnight.  Brief hospital course: Pt. with PMH of  CAD status post remote CABG, end-stage renal disease, history of CVA, hypertension, and insulin-dependent diabetes mellitus; admitted on 02/10/2017, presented with complaint of chest pain, was found to have unstable angina. Currently further plan is close monitoring  Assessment and Plan: 1. Chest pain; CAD  - Pt presents with left-sided chest pain at rest, has known CAD s/p remote CABG and stenting - EKG and CXR unremarkable, initial troponin 0.03  - Treated with ASA 324, started on Lopressor in setting of hypertension, continue cardiac monitoring, -Echocardiogram shows preserved EF,  akinesis of the basalinferoseptal myocardium -Cardiology consulted, underwent cardiac catheterization shows nonobstructive CAD and recommend medical management.  2. Left facial numbness - Pt presents with left facial numbness  - MRI brain negative for acute process, notable for old CVA's vs metastatic disease - Continue neuro checks, PT/OT eval, aspirin, glycemic-control, supportive care  3. ESRD  - Dialyzed without incident  - No acidosis, uremia, hyperkalemia, or significant volume excess on admission; HTN improved with Lopressor   4. Hypertension with hypertensive urgency  - BP elevated to 200/70 range in ED  - Start Lopressor in setting of CAD with chest pain   -Needs better blood pressure control on discharge.  5. Insulin-dependent DM  - A1c was only 5.3% last year, 6.7 right now. - Managed at home with Lantus 8-10 units qHS  - Check CBG's, continue Lantus and start a low-intensity SSI  6.  Hypokalemia  hypomagnesemia - Serum potassium is 2.9 on admission  - Treated with 20 mEq oral potassium  now better.  Diet: renal diet DVT Prophylaxis: subcutaneous Heparin  Advance goals of care discussion: full code  Family Communication: no family was present at bedside, at the time of interview.  Disposition:  Discharge to home.  Consultants: nephrology, cardiolog Procedures: Echocardiogram   Antibiotics: Anti-infectives (From admission, onward)   None     Objective: Physical Exam: Vitals:   02/12/17 1720 02/12/17 1736 02/12/17 1751 02/12/17 1807  BP: (!) 149/65 (!) 151/90 113/69 (!) 145/56  Pulse:      Resp: 12 14 (!) 22 17  Temp:      TempSrc:      SpO2:      Weight:      Height:        Intake/Output Summary (Last 24 hours) at 02/12/2017 1816 Last data filed at 02/12/2017 1600 Gross per 24 hour  Intake 120 ml  Output 2250 ml  Net -2130 ml   Filed Weights   02/12/17 0420 02/12/17 0740 02/12/17 1100  Weight: 76 kg (167 lb 8.8 oz) 76 kg (167 lb 8.8 oz) 73.8 kg (162 lb 11.2 oz)   General: Alert, Awake and Oriented to Time, Place and Person. Appear in mild distress, affect appropriate Eyes: PERRL, Conjunctiva normal ENT: Oral Mucosa clear moist. Neck: no JVD, no Abnormal Mass Or lumps Cardiovascular: S1 and S2 Present, no Murmur, Peripheral Pulses Present Respiratory: normal respiratory effort, Bilateral Air entry equal and Decreased, no use of accessory muscle, Clear to Auscultation, no Crackles, no wheezes Abdomen: Bowel Sound present, Soft and no tenderness, no  hernia Skin: no redness, no Rash, no induration Extremities: no Pedal edema, no calf tenderness Neurologic: Grossly no focal neuro deficit. Bilaterally Equal motor strength  Data Reviewed: CBC: Recent Labs  Lab 02/10/17 1554 02/11/17 0940 02/12/17 0437  WBC 8.1 6.9 6.3  NEUTROABS 4.3  --   --   HGB 11.2* 9.4* 9.7*  HCT 34.1* 28.2* 29.0*  MCV 93.2 90.7 90.9  PLT 209 173 903   Basic  Metabolic Panel: Recent Labs  Lab 02/10/17 1554 02/10/17 2048 02/11/17 0809 02/12/17 0437  NA 136  --  134* 133*  K 2.9*  --  3.3* 3.3*  CL 96*  --  98* 97*  CO2 26  --  27 27  GLUCOSE 245*  --  85 106*  BUN 37*  --  44* 51*  CREATININE 3.48*  --  4.57* 5.58*  CALCIUM 8.1*  --  7.7* 7.6*  MG  --  1.6* 1.8 1.9  PHOS  --   --   --  4.8*    Liver Function Tests: Recent Labs  Lab 02/11/17 0809 02/12/17 0437  AST 15  --   ALT 11*  --   ALKPHOS 49  --   BILITOT 0.8  --   PROT 5.4*  --   ALBUMIN 2.3* 2.2*   No results for input(s): LIPASE, AMYLASE in the last 168 hours. No results for input(s): AMMONIA in the last 168 hours. Coagulation Profile: Recent Labs  Lab 02/10/17 1554  INR 0.99   Cardiac Enzymes: Recent Labs  Lab 02/10/17 1554 02/10/17 2048 02/11/17 0809 02/11/17 1336 02/11/17 1940  TROPONINI 0.03* 0.03* <0.03 <0.03 <0.03   BNP (last 3 results) No results for input(s): PROBNP in the last 8760 hours. CBG: Recent Labs  Lab 02/12/17 1125 02/12/17 1157 02/12/17 1603 02/12/17 1621 02/12/17 1704  GLUCAP 53* 142* 54* 55* 83   Studies: No results found.  Scheduled Meds: .  stroke: mapping our early stages of recovery book   Does not apply Once  . aspirin EC  81 mg Oral QHS  . cinacalcet  30 mg Oral QHS  . diphenhydrAMINE  25 mg Oral QHS  . heparin  5,000 Units Subcutaneous Q8H  . Influenza vac split quadrivalent PF  0.5 mL Intramuscular Tomorrow-1000  . insulin aspart  0-5 Units Subcutaneous QHS  . insulin aspart  0-9 Units Subcutaneous TID WC  . insulin glargine  8 Units Subcutaneous QHS  . metoprolol tartrate  25 mg Oral BID  . multivitamin  1 tablet Oral QPM  . pantoprazole  40 mg Oral Daily  . pneumococcal 23 valent vaccine  0.5 mL Intramuscular Tomorrow-1000  . sodium chloride flush  3 mL Intravenous Q12H   Continuous Infusions: . sodium chloride 10 mL/hr at 02/12/17 1751  . sodium chloride     PRN Meds: sodium chloride, acetaminophen,  albuterol, hydrALAZINE, morphine injection, nitroGLYCERIN, ondansetron (ZOFRAN) IV, sodium chloride flush  Time spent: 35 minutes  Author: Berle Mull, MD [facility] Pager: [phone] 02/12/2017 6:16 PM  If 7PM-7AM, please contact night-coverage at [email or web address], password [facility]

## 2017-02-12 NOTE — Progress Notes (Signed)
Post Catheterization Cardiology Follow-Up  Diffuse CAD and LV and PDA branches of RCA.  Also severe diffuse proximal to distal disease in the circumflex.  Graft to the circumflex is totally occluded.  LIMA to LAD is atretic.  Medical therapy for control of angina is recommended.  Metoprolol was added today.  Good blood pressure control Gurnie be important.

## 2017-02-12 NOTE — Consult Note (Addendum)
Renal Service Consult Note [facility]  Alan [name] 02/12/2017 [name] Requesting Physician:  Dr Alan [name], Alan [name].  Reason for Consult:  ESRD pt with chest pain HPI: The patient is a 76 y.o. year-old with hx of CAD/ CABG, HTN, ESRD on HD Alan [name], presented with chest pain and L face numbness.  Seen by cardiology, mild trop bump and ^'ing anginal pain, they are planning for heart cath today.  MRI brain was neg for acute process, showed old CVA's vs metastatic dz.  Asked to see for dialysis.    Patient not sure what dialysis unit he goes to, it is in [place].  [place] and [place].    Denies any recent issues with HD.  NO SOB , abd pain today.    ROS  no joint pain   no HA  no blurry vision  no rash  no diarrhea  no nausea/ vomiting   Past Medical History  Past Medical History:  Diagnosis Date  . Arthritis   . Asthma   . Clotted renal dialysis arteriovenous graft ([place])   . Coronary artery disease 2000   Stents [facility]  . CVA (cerebral infarction)   . DDD (degenerative disc disease), cervical   . DDD (degenerative disc disease), lumbar   . Diabetes mellitus   . ESRD (end stage renal disease) on dialysis ([place])   . Gangrene of toe ([place]) Feb. 2015   Right  great    . Gastric ulcer    EGD 2014  . Gout   . Hypercholesteremia   . Hypertension   . Stroke ([place])   . 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 Thrombosis of renal dialysis arteriovenous graft [facility])    Past Surgical History  Past Surgical History:  Procedure Laterality Date  . A/V FISTULAGRAM Left 05/28/2016   Procedure: A/V Fistulagram;  Surgeon: Katha Cabal, MD;  Location: [place] CV [facility];  Service: Cardiovascular;  Laterality: Left;  . A/V FISTULAGRAM Left 10/28/2016   Procedure: A/V Fistulagram;  Surgeon: Algernon Huxley, MD;  Location: [place] CV [facility];  Service: Cardiovascular;  Laterality: Left;  . A/V SHUNT INTERVENTION N/A 05/28/2016   Procedure: A/V Shunt Intervention;  Surgeon: Katha Cabal, MD;  Location: [place] CV [facility];  Service: Cardiovascular;  Laterality: N/A;  . AMPUTATION Right 04/10/2013   Procedure: Right Below Knee Amputation;  Surgeon: Newt Minion, MD;  Location: [place];  Service: Orthopedics;  Laterality: Right;  Right Below Knee Amputation  . APPENDECTOMY    . BELOW KNEE LEG AMPUTATION Bilateral   . CHOLECYSTECTOMY    . CORONARY ARTERY BYPASS GRAFT  1996   [place]  . ESOPHAGOGASTRODUODENOSCOPY Left 05/24/2012   NKN:LZJQBHAL dilated baggy but otherwise a normal/ Small hiatal hernia. Gastric ulcer -S/P biopsy  . PERIPHERAL VASCULAR CATHETERIZATION N/A 08/09/2014   Procedure: A/V Shuntogram/Fistulagram;  Surgeon: Katha Cabal, MD;  Location: [place] [facility];  Service: Cardiovascular;  Laterality: N/A;  . PERIPHERAL VASCULAR CATHETERIZATION Left 08/09/2014   Procedure: A/V Shunt Intervention;  Surgeon: Katha Cabal, MD;  Location: [place] CV [facility];  Service: Cardiovascular;  Laterality: Left;   Family History  Family History  Problem Relation Age of Onset  . Cancer Mother   . Colon cancer Neg Hx    Social History  reports that he quit smoking about 12 years ago. he has never used smokeless tobacco. He reports that he does not drink alcohol or use drugs.  Allergies  Allergies  Allergen Reactions  . Codeine Itching and Nausea And Vomiting  . [place] Venom [Bee Venom] Swelling   Home medications Prior to Admission medications   Medication Sig Start Date End Date Taking? Authorizing Provider  acetaminophen (TYLENOL) 500 MG tablet Take 1,000 mg by mouth every 8 (eight) hours as needed for mild pain or moderate pain.    Yes [provider]  albuterol (PROVENTIL HFA;VENTOLIN HFA) 108 (90 Base) MCG/ACT inhaler Inhale 2 puffs into the lungs every 4 (four) hours as needed for wheezing or shortness of breath.   Yes [provider]  Amino Acid Infusion (PROSOL) 20 % SOLN  every Monday, Wednesday, and Friday.  10/27/14  Yes [provider]  Ascorbic Acid (VITAMIN C) 1000 MG tablet Take 1,000 mg by mouth daily.   Yes [provider]  aspirin EC 81 MG tablet Take 81 mg by mouth at bedtime.    Yes [provider]  B Complex-C-Folic Acid (NEPHRO-VITE PO) Take 1 tablet by mouth every evening.    Yes [provider]  cinacalcet (SENSIPAR) 30 MG tablet Take 30 mg by mouth at bedtime.    Yes [provider]  Coenzyme Q10 (COQ10) 100 MG CAPS Take 100 mg by mouth at bedtime.   Yes [provider]  diphenhydrAMINE (BENADRYL) 25 mg capsule Take 25 mg by mouth at bedtime.    Yes [provider]  LANTUS SOLOSTAR 100 UNIT/ML Solostar Pen Inject 8-10 Units into the skin every evening.  12/21/13  Yes [provider]  nitroGLYCERIN (NITROSTAT) 0.4 MG SL tablet Place 1 tablet (0.4 mg total) under the tongue every 5 (five) minutes as needed for chest pain. 11/18/14  Yes Samuella Cota, MD  omeprazole (PRILOSEC) 20 MG capsule Take 20 mg by mouth 2 (two) times daily before a meal.  02/03/17  Yes [provider]  [place] test strip  03/23/16   [provider]   Liver Function Tests Recent Labs  Lab 02/11/17 0809 02/12/17 0437  AST 15  --   ALT 11*  --   ALKPHOS 49  --   BILITOT 0.8  --   PROT 5.4*  --   ALBUMIN 2.3* 2.2*   No results for input(s): LIPASE, AMYLASE in the last 168 hours. CBC Recent Labs  Lab 02/10/17 1554 02/11/17 0940 02/12/17 0437  WBC 8.1 6.9 6.3  NEUTROABS 4.3  --   --   HGB 11.2* 9.4* 9.7*  HCT 34.1* 28.2* 29.0*  MCV 93.2 90.7 90.9  PLT 209 173 500   Basic Metabolic Panel Recent Labs  Lab 02/10/17 1554 02/11/17 0809 02/12/17 0437  NA 136 134* 133*  K 2.9* 3.3* 3.3*  CL 96* 98* 97*  CO2 26 27 27   GLUCOSE 245* 85 106*  BUN 37* 44* 51*  CREATININE 3.48* 4.57* 5.58*  CALCIUM 8.1* 7.7* 7.6*  PHOS  --   --  4.8*   Iron/TIBC/Ferritin/ %Sat     Component Value Date/Time   IRON 121 08/14/2015 0619   TIBC 203 (L) 08/14/2015 0619   FERRITIN 380 (H) 05/20/2012 0527   IRONPCTSAT 60 (H) 08/14/2015 0619    Vitals:   02/12/17 0730 02/12/17 0740 02/12/17 0800 02/12/17 0830  BP: (!) 147/65  140/73 (!) 156/65  Pulse: (!) 55  (!) 56 (!) 55  Resp: 13  12 12   Temp:      TempSrc:      SpO2: 96% 99%  Weight:  76 kg (167 lb 8.8 oz)    Height:       Exam Gen alert, no distress No rash, cyanosis or gangrene Sclera anicteric, throat clear  No jvd or bruits Chest clear bilat RRR no MRG, sternotomy scar well healed Abd soft ntnd no mass or ascites +bs GU normal male MS no joint effusions or deformity Ext bilat BKA, no edema, no wounds or ulcers Neuro is alert, Ox 3 , nf    Dialysis: [facility] MWF   76kg edw     Impression: 1. Chest pain/ hx CABG - going to cath lab soon.   2. ESRD - HD MWF   3. HTN - BP's improving 4. L face numbness - per primary, MRI brain neg for acute 5. IDDM 6. Anemia of CKD - Hb 10- 11 here   Plan - HD today, UF 2 L as tol  Kelly Splinter MD [employer] pager [phone]   02/12/2017, 9:24 AM

## 2017-02-12 NOTE — Progress Notes (Signed)
OT Cancellation Note  Patient Details Name: Alan [name] MRN: [id number] DOB: 05/09/1940   Cancelled Treatment:    Reason Eval/Treat Not Completed: Patient at procedure or test/ unavailable(HD)  [name] 02/12/2017, 9:42 AM  02/12/2017 Nestor Lewandowsky, OTR/L Pager: [phone]

## 2017-02-12 NOTE — Procedures (Signed)
   I was present at this dialysis session, have reviewed the session itself and made  appropriate changes Kelly Splinter MD [place] pager [phone]   02/12/2017, 9:34 AM

## 2017-02-12 NOTE — H&P (View-Only) (Signed)
The patient has been seen in conjunction with Cecilie Kicks, NP-C. All aspects of care have been considered and discussed. The patient has been personally interviewed, examined, and all clinical data has been reviewed.   Currently on dialysis.  Asymptomatic.  Coronary angiography later today.  This Reyce help define anatomy.  Further management Khyson be based upon findings.  Progress Note  Patient Name: Alan [name] Date of Encounter: 02/12/2017  Primary Cardiologist: Dr. Harl Bowie  Subjective   No chest pain, no SOB now in dialysis   Inpatient Medications    Scheduled Meds: .  stroke: mapping our early stages of recovery book   Does not apply Once  . aspirin EC  81 mg Oral QHS  . cinacalcet  30 mg Oral QHS  . diphenhydrAMINE  25 mg Oral QHS  . heparin  5,000 Units Subcutaneous Q8H  . Influenza vac split quadrivalent PF  0.5 mL Intramuscular Tomorrow-1000  . insulin aspart  0-5 Units Subcutaneous QHS  . insulin aspart  0-9 Units Subcutaneous TID WC  . insulin glargine  8 Units Subcutaneous QHS  . metoprolol tartrate  25 mg Oral BID  . multivitamin  1 tablet Oral QPM  . pantoprazole  40 mg Oral Daily  . pneumococcal 23 valent vaccine  0.5 mL Intramuscular Tomorrow-1000  . sodium chloride flush  3 mL Intravenous Q12H   Continuous Infusions: . sodium chloride    . sodium chloride 10 mL/hr at 02/12/17 2706   PRN Meds: sodium chloride, acetaminophen, albuterol, morphine injection, nitroGLYCERIN, ondansetron (ZOFRAN) IV, sodium chloride flush   Vital Signs    Vitals:   02/12/17 0010 02/12/17 0045 02/12/17 0420 02/12/17 0600  BP: (!) 181/69 (!) 148/53 (!) 171/60 (!) 162/63  Pulse:      Resp: 14 12 18 14   Temp: 98.1 F (36.7 C)  98 F (36.7 C)   TempSrc: Oral  Oral   SpO2: 100%  100% 99%  Weight:   167 lb 8.8 oz (76 kg)   Height:        Intake/Output Summary (Last 24 hours) at 02/12/2017 0703 Last data filed at 02/12/2017 0400 Gross per 24 hour  Intake 840 ml    Output 650 ml  Net 190 ml   Filed Weights   02/10/17 1540 02/11/17 0132 02/12/17 0420  Weight: 161 lb (73 kg) 168 lb 6.9 oz (76.4 kg) 167 lb 8.8 oz (76 kg)    Telemetry    SR to SB - Personally Reviewed  ECG    SB with 1st degree AV block  - Personally Reviewed  Physical Exam   GEN: No acute distress.   Neck: No JVD Cardiac: RRR, no murmurs, rubs, or gallops.  Respiratory: Clear to auscultation bilaterally. GI: Soft, nontender, non-distended  MS: No edema; No deformity. Neuro:  Nonfocal  Psych: Normal affect   Labs    Chemistry Recent Labs  Lab 02/10/17 1554 02/11/17 0809 02/12/17 0437  NA 136 134* 133*  K 2.9* 3.3* 3.3*  CL 96* 98* 97*  CO2 26 27 27   GLUCOSE 245* 85 106*  BUN 37* 44* 51*  CREATININE 3.48* 4.57* 5.58*  CALCIUM 8.1* 7.7* 7.6*  PROT  --  5.4*  --   ALBUMIN  --  2.3* 2.2*  AST  --  15  --   ALT  --  11*  --   ALKPHOS  --  49  --   BILITOT  --  0.8  --   GFRNONAA 15* 11*  9*  GFRAA 18* 13* 10*  ANIONGAP 14 9 9      Hematology Recent Labs  Lab 02/10/17 1554 02/11/17 0940 02/12/17 0437  WBC 8.1 6.9 6.3  RBC 3.66* 3.11* 3.19*  HGB 11.2* 9.4* 9.7*  HCT 34.1* 28.2* 29.0*  MCV 93.2 90.7 90.9  MCH 30.6 30.2 30.4  MCHC 32.8 33.3 33.4  RDW 13.4 13.3 13.4  PLT 209 173 164    Cardiac Enzymes Recent Labs  Lab 02/10/17 2048 02/11/17 0809 02/11/17 1336 02/11/17 1940  TROPONINI 0.03* <0.03 <0.03 <0.03   No results for input(s): TROPIPOC in the last 168 hours.   BNPNo results for input(s): BNP, PROBNP in the last 168 hours.   DDimer No results for input(s): DDIMER in the last 168 hours.   Radiology    Dg Chest 2 View  Result Date: 02/10/2017 CLINICAL DATA:  Chest pain. EXAM: CHEST  2 VIEW COMPARISON:  Radiograph of June 15, 2016. FINDINGS: Stable cardiomediastinal silhouette. No pneumothorax or pleural effusion is noted. Stable bilateral midlung scarring is noted. Stable elevated left hemidiaphragm is noted. No acute pulmonary  disease is noted. Bony thorax is unremarkable. IMPRESSION: Stable bilateral midlung scarring.  No acute abnormality seen. Electronically Signed   By: Marijo Conception, M.D.   On: 02/10/2017 16:27   Ct Head Wo Contrast  Result Date: 02/10/2017 CLINICAL DATA:  Left-sided numbness. EXAM: CT HEAD WITHOUT CONTRAST TECHNIQUE: Contiguous axial images were obtained from the base of the skull through the vertex without intravenous contrast. COMPARISON:  None. FINDINGS: Brain: Mild chronic ischemic white matter disease is noted. No mass effect or midline shift is noted. Focal high density is seen in superior portion of left parietal cortex concerning for intraparenchymal hemorrhage or contusion. No mass lesion or acute infarction is noted. Vascular: No hyperdense vessel or unexpected calcification. Skull: Normal. Negative for fracture or focal lesion. Sinuses/Orbits: Mild bilateral ethmoid sinusitis is noted. Other: None. IMPRESSION: Mild chronic ischemic white matter disease. Probable focal intraparenchymal hemorrhage or contusion seen involving superior portion of left parietal cortex. MRI is recommended for further evaluation. Critical Value/emergent results were called by telephone at the time of interpretation on 02/10/2017 at 4:40 pm to Dr. Davonna Belling , who verbally acknowledged these results. Electronically Signed   By: Marijo Conception, M.D.   On: 02/10/2017 16:40   Mr Brain Wo Contrast  Result Date: 02/10/2017 CLINICAL DATA:  LEFT facial numbness and chest pain today. History of stroke, end-stage renal disease on dialysis, diabetes, hypertension, hyperlipidemia. EXAM: MRI HEAD WITHOUT CONTRAST TECHNIQUE: Multiplanar, multiecho pulse sequences of the brain and surrounding structures were obtained without intravenous contrast. COMPARISON:  CT HEAD February 10, 2017 at 1617 hours FINDINGS: Multiple sequences are moderately motion degraded. BRAIN: No reduced diffusion to suggest acute ischemia. LEFT  posterior frontal lobe susceptibility artifact corresponding to density on today's CT, with hemosiderin ring, no associated edema. Patchy bilateral cerebellar T2 hyperintensities. Old bilateral thalamus lacunar infarcts. Old LEFT pontine infarct. The ventricles and sulci are normal for patient's age. No suspicious parenchymal signal, mass or mass effect. No abnormal extra-axial fluid collections. VASCULAR: Normal major intracranial vascular flow voids present at skull base. SKULL AND UPPER CERVICAL SPINE: No abnormal sellar expansion. No suspicious calvarial bone marrow signal. Craniocervical junction maintained. SINUSES/ORBITS: Posterior ethmoid opacification in mild paranasal sinus mucosal thickening. Mastoid air cells are well aerated. The included ocular globes and orbital contents are non-suspicious. OTHER: None. IMPRESSION: 1. No acute process on this moderately motion degraded examination. 2.  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 3. Multiple old cerebellar infarcts though, given rounded morphology, metastatic disease is possible. Recommend nonemergent follow-up MRI of the brain with contrast when patient is able to remain still. 4. Old lacunar infarcts and moderate chronic small vessel ischemic disease. Electronically Signed   By: Elon Alas M.D.   On: 02/10/2017 19:29    Cardiac Studies   02/11/17 Echo Study Conclusions  - Left ventricle: The cavity size was normal. There was mild   concentric hypertrophy. Systolic function was normal. The   estimated ejection fraction was in the range of 55% to 60%. There   is akinesis of the basalinferoseptal myocardium. Features are   consistent with a pseudonormal left ventricular filling pattern,   with concomitant abnormal relaxation and increased filling   pressure (grade 2 diastolic dysfunction). - Aortic valve: Trileaflet; moderately thickened, moderately   calcified leaflets. - Left  atrium: Volume/bsa, S: 31.9 ml/m^2.  Patient Profile     76 y.o. male  with a hx of CAD s/p CABG, ESRD on HD MWF, history of CVA, PAD s/p bilateral below knee amputation, hypertension and DM 2 on insulin    Assessment & Plan    [place] Angina  Very mildly elevated troponin in pt with ESRD on HD  With increased symptoms proceeding with cardiac cath today after dialysis.   CAD with hx of CABG with increasing episodes of angina.   ESRD with HD on MWF going today  Hypokalemia - 3.3 today but going to dialysis  HTN urgency on admit with systolic > 097 and was running 170s at home--today 148/53 to 171/60   DM-2 per IM   PAD with hx bil BKA  Stable.    For questions or updates, please contact [place] Please consult [email or web address] for contact info under Cardiology/STEMI.      Signed, Cecilie Kicks, NP  02/12/2017, 7:03 AM

## 2017-02-13 ENCOUNTER — Encounter (INDEPENDENT_AMBULATORY_CARE_PROVIDER_SITE_OTHER): Payer: Medicare Other

## 2017-02-13 ENCOUNTER — Encounter (HOSPITAL_COMMUNITY): Payer: Self-pay | Admitting: Cardiovascular Disease

## 2017-02-13 ENCOUNTER — Ambulatory Visit (INDEPENDENT_AMBULATORY_CARE_PROVIDER_SITE_OTHER): Payer: Medicare Other | Admitting: Vascular Surgery

## 2017-02-13 DIAGNOSIS — I208 Other forms of angina pectoris: Secondary | ICD-10-CM

## 2017-02-13 LAB — GLUCOSE, CAPILLARY
GLUCOSE-CAPILLARY: 80 mg/dL (ref 65–99)
Glucose-Capillary: 71 mg/dL (ref 65–99)
Glucose-Capillary: 104 mg/dL — ABNORMAL HIGH (ref 65–99)

## 2017-02-13 LAB — RENAL FUNCTION PANEL
ALBUMIN: 2.2 g/dL — AB (ref 3.5–5.0)
ANION GAP: 7 (ref 5–15)
BUN: 25 mg/dL — AB (ref 6–20)
CALCIUM: 7.5 mg/dL — AB (ref 8.9–10.3)
CO2: 28 mmol/L (ref 22–32)
Chloride: 97 mmol/L — ABNORMAL LOW (ref 101–111)
Creatinine, Ser: 3.86 mg/dL — ABNORMAL HIGH (ref 0.61–1.24)
GFR calc Af Amer: 16 mL/min — ABNORMAL LOW (ref >60)
GFR calc non Af Amer: 14 mL/min — ABNORMAL LOW (ref >60)
GLUCOSE: 83 mg/dL (ref 65–99)
PHOSPHORUS: 3.3 mg/dL (ref 2.5–4.6)
Potassium: 3.5 mmol/L (ref 3.5–5.1)
SODIUM: 132 mmol/L — AB (ref 135–145)

## 2017-02-13 LAB — CBC
HCT: 29.1 % — ABNORMAL LOW (ref 39.0–52.0)
HEMOGLOBIN: 9.7 g/dL — AB (ref 13.0–17.0)
MCH: 30.6 pg (ref 26.0–34.0)
MCHC: 33.3 g/dL (ref 30.0–36.0)
MCV: 91.8 fL (ref 78.0–100.0)
PLATELETS: 161 10*3/uL (ref 150–400)
RBC: 3.17 MIL/uL — AB (ref 4.22–5.81)
RDW: 13.5 % (ref 11.5–15.5)
WBC: 6.3 10*3/uL (ref 4.0–10.5)

## 2017-02-13 LAB — HEPATITIS B SURFACE ANTIGEN: Hepatitis B Surface Ag: NEGATIVE

## 2017-02-13 LAB — HEPATITIS B CORE ANTIBODY, TOTAL: HEP B C TOTAL AB: NEGATIVE

## 2017-02-13 LAB — HEPATITIS B SURFACE ANTIBODY,QUALITATIVE: HEP B S AB: NONREACTIVE

## 2017-02-13 MED ORDER — ISOSORBIDE MONONITRATE ER 30 MG PO TB24
15.0000 mg | ORAL_TABLET | Freq: Every day | ORAL | Status: DC
  Administered 2017-02-13: 15 mg via ORAL
  Filled 2017-02-13: qty 1

## 2017-02-13 MED ORDER — METOPROLOL SUCCINATE ER 25 MG PO TB24: 12.5000 mg | ORAL_TABLET | Freq: Every day | ORAL | 0 refills | Status: DC

## 2017-02-13 MED ORDER — ISOSORBIDE MONONITRATE ER 30 MG PO TB24: 15.0000 mg | ORAL_TABLET | Freq: Every day | ORAL | 0 refills | Status: DC

## 2017-02-13 NOTE — Discharge Summary (Signed)
[facility] Discharge Summary   Patient: Alan [name] PYP:[id number]   PCP: [facility] DOB: 1940/08/23   Date of admission: 02/10/2017   Date of discharge:  02/13/2017    Discharge Diagnoses:  Principal Problem:   Chest pain Active Problems:   CAD (coronary artery disease)   DM type 2 causing vascular disease ([place])   ESRD on dialysis [facility])   Essential hypertension   Left sided numbness   Hypertensive urgency   Hypokalemia   Admitted From: home Disposition:  Home with family  Recommendations for Outpatient Follow-up:  1. Please follow-up with PCP in 1 week  Follow-up Information    [facility] Schedule an appointment as soon as possible for a visit in 1 week(s).   Contact information: [address] [place] [address] [phone]        Erma Heritage, PA-C Follow up on 03/03/2017.   Specialties:  Physician Assistant, Cardiology Why:  Please arrive at 2:45 PM for 3:00 PM appointment Contact information: [address] [place] [address] [phone]          Diet recommendation: Renal diet  Activity: The patient is advised to gradually reintroduce usual activities.  Discharge Condition: good  Code Status: Full code  History of present illness: As per the H and P dictated on admission, " Alan [name] is a 76 y.o. male with medical history significant for CAD status post remote CABG, end-stage renal disease, history of CVA, hypertension, and insulin-dependent diabetes mellitus, now presenting to the emergency department for evaluation of chest pain and left-sided facial numbness.  Patient reports that he was in his usual state of health until development of dull pain in the left side of his chest approximately 2 days ago.  Pain is nonradiating, waxing and severe at times, associated with mild nausea, but no shortness of breath or diaphoresis.  No significant dyspnea or cough, and no fever or chills.   Patient also reports development of numbness involving most of his left face, particularly the jaw and lips.  Numbness has been constant and unchanged.  Denies any other focal weakness or numbness and there is no acute change in his vision or hearing.  ED Course: Upon arrival to the ED, patient is found to be afebrile, saturating well on room air, hypertensive to 213/73, and is otherwise stable.  EKG features a sinus rhythm and chest x-ray is negative for acute cardiopulmonary disease.  Contrast head CT is notable for mild chronic ischemic white matter disease and possible focal intraparenchymal hemorrhage or contusion at the superior left parietal cortex.  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  Chemistry panel is notable for a potassium of 2.9 and CBC features a chronic stable normocytic anemia with hemoglobin of 11.7.  Troponin is at the borderline of elevation at 0.03.  He was treated with nitroglycerin in the emergency department but continues to complain of chest pain CAD and left-sided facial numbness and Deronte be admitted to the stepdown unit to [facility] for ongoing evaluation chest pain in a patient with CAD and elevated troponin, as well as left-sided facial numbness with no definite acute finding on the MRI brain."  Hospital Course:  Summary of his active problems in the hospital is as following. 1.Chest pain; CAD -Pt presents with left-sided chest pain at rest, has known CAD  s/p remote CABG and stenting -EKG and CXR unremarkable, initial troponin 0.03 -Treated with ASA 324, started on Lopressor in setting of hypertension, continue cardiac monitoring, -Echocardiogram shows preserved EF, akinesis of the basalinferoseptal myocardium -Cardiology consulted, underwent cardiac catheterization shows nonobstructive CAD and recommend medical management. Noted  on 15 mg Imdur as well as 12.5 mg Toprol-XL daily.  2.Left facial numbness -Pt presents with left facial numbness -MRI brain negative for acute process, notable for old CVA's vs metastatic disease -Recommended repeat MRI with and without contrast once stable as an outpatient.  3.ESRD -Dialyzed without incident -No acidosis, uremia, hyperkalemia, or significant volume excess on admission; HTN improved with Lopressor  4.Hypertension with hypertensive urgency -BP elevated to 200/70 range in ED -Start Lopressor in setting of CAD with chest pain  5.Insulin-dependent DM -A1c was only 5.3% last year, 6.7 right now. -Managed at home with Lantus 8-10 units qHS  6.Hypokalemia hypomagnesemia -Serum potassium is 2.9 on admission -Treated with 20 mEq oral potassium now better.  All other chronic medical condition were stable during the hospitalization.  Patient was seen by physical therapy, who recommended no PT follow up needed On the day of the discharge the patient's vitals were stable, and no other acute medical condition were reported by patient. the patient was felt safe to be discharge at home with family.  Procedures and Results:  Echocardiogram  Study Conclusions  - Left ventricle: The cavity size was normal. There was mild   concentric hypertrophy. Systolic function was normal. The   estimated ejection fraction was in the range of 55% to 60%. There   is akinesis of the basalinferoseptal myocardium. Features are   consistent with a pseudonormal left ventricular filling pattern,   with concomitant abnormal relaxation and increased filling   pressure (grade 2 diastolic dysfunction). - Aortic valve: Trileaflet; moderately thickened, moderately   calcified leaflets. - Left atrium: Volume/bsa, S: 31.9 ml/m^2.   Cardiac cath  Conclusion     Mid RCA lesion is 50% stenosed.  2nd RPLB lesion is 90% stenosed.  Ost 1st Mrg to 1st Mrg lesion is  70% stenosed.  Origin lesion is 100% stenosed.  Origin to Prox Graft lesion is 100% stenosed.  The left ventricular systolic function is normal.  LV end diastolic pressure is normal.  The left ventricular ejection fraction is 55-65% by visual estimate.    Hemodialysis  Consultations:  Cardiology  Nephrology  DISCHARGE MEDICATION: Allergies as of 02/13/2017      Reactions   Codeine Itching, Nausea And Vomiting   Yellow Jacket Venom [bee Venom] Swelling      Medication List    TAKE these medications   ACCU-CHEK AVIVA PLUS test strip Generic drug:  glucose blood   acetaminophen 500 MG tablet Commonly known as:  TYLENOL Take 1,000 mg by mouth every 8 (eight) hours as needed for mild pain or moderate pain.   albuterol 108 (90 Base) MCG/ACT inhaler Commonly known as:  PROVENTIL HFA;VENTOLIN HFA Inhale 2 puffs into the lungs every 4 (four) hours as needed for wheezing or shortness of breath.   aspirin EC 81 MG tablet Take 81 mg by mouth at bedtime.   cinacalcet 30 MG tablet Commonly known as:  SENSIPAR Take 30 mg by mouth at bedtime.   CoQ10 100 MG Caps Take 100 mg by mouth at bedtime.   diphenhydrAMINE 25 mg capsule Commonly known as:  BENADRYL Take 25 mg by mouth at bedtime.   isosorbide mononitrate 30 MG 24 hr tablet  Commonly known as:  IMDUR Take 0.5 tablets (15 mg total) by mouth daily. Start taking on:  02/14/2017   LANTUS SOLOSTAR 100 UNIT/ML Solostar Pen Generic drug:  Insulin Glargine Inject 8-10 Units into the skin every evening.   metoprolol succinate 25 MG 24 hr tablet Commonly known as:  TOPROL XL Take 0.5 tablets (12.5 mg total) by mouth daily.   NEPHRO-VITE PO Take 1 tablet by mouth every evening.   nitroGLYCERIN 0.4 MG SL tablet Commonly known as:  NITROSTAT Place 1 tablet (0.4 mg total) under the tongue every 5 (five) minutes as needed for chest pain.   omeprazole 20 MG capsule Commonly known as:  PRILOSEC Take 20 mg by mouth 2  (two) times daily before a meal.   PROSOL 20 % Soln every Monday, Wednesday, and Friday.   vitamin C 1000 MG tablet Take 1,000 mg by mouth daily.      Allergies  Allergen Reactions  . Codeine Itching and Nausea And Vomiting  . Yellow Jacket Venom [Bee Venom] Swelling   Discharge Instructions    Diet renal [id number]   Complete by:  As directed    Discharge instructions   Complete by:  As directed    It is important that you read following instructions as well as go over your medication list with RN to help you understand your care after this hospitalization.  Discharge Instructions: Please follow-up with PCP in one week  Please request your primary care physician to go over all Hospital Tests and Procedure/Radiological results at the follow up,  Please get all Hospital records sent to your PCP by signing hospital release before you go home.   Do not take more than prescribed Pain, Sleep and Anxiety Medications. You were cared for by a hospitalist during your hospital stay. If you have any questions about your discharge medications or the care you received while you were in the hospital after you are discharged, you can call the unit and ask to speak with the hospitalist on call if the hospitalist that took care of you is not available.  Once you are discharged, your primary care physician Xue handle any further medical issues. Please note that NO REFILLS for any discharge medications [place] be authorized once you are discharged, as it is imperative that you return to your primary care physician (or establish a relationship with a primary care physician if you do not have one) for your aftercare needs so that they can reassess your need for medications and monitor your lab values. You Must read complete instructions/literature along with all the possible adverse reactions/side effects for all the Medicines you take and that have been prescribed to you. Take any new Medicines after  you have completely understood and accept all the possible adverse reactions/side effects. Wear Seat belts while driving. If you have smoked or chewed Tobacco in the last 2 yrs please stop smoking and/or stop any Recreational drug use.   Increase activity slowly   Complete by:  As directed      Discharge Exam: Filed Weights   02/12/17 0740 02/12/17 1100 02/13/17 0536  Weight: 76 kg (167 lb 8.8 oz) 73.8 kg (162 lb 11.2 oz) 74.5 kg (164 lb 3.9 oz)   Vitals:   02/13/17 0942 02/13/17 1111  BP:  (!) 139/52  Pulse: (!) 50 (!) 50  Resp:  13  Temp:  98.5 F (36.9 C)  SpO2:  100%   General: Appear in no distress, no Rash; Oral Mucosa moist.  Cardiovascular: S1 and S2 Present, no Murmur, no JVD Respiratory: Bilateral Air entry present and Clear to Auscultation, no Crackles, no wheezes Abdomen: Bowel Sound present, Soft and no tenderness Extremities: Bilateral BKA Neurology: Grossly no focal neuro deficit.  The results of significant diagnostics from this hospitalization (including imaging, microbiology, ancillary and laboratory) are listed below for reference.    Significant Diagnostic Studies: Dg Chest 2 View  Result Date: 02/10/2017 CLINICAL DATA:  Chest pain. EXAM: CHEST  2 VIEW COMPARISON:  Radiograph of June 15, 2016. FINDINGS: Stable cardiomediastinal silhouette. No pneumothorax or pleural effusion is noted. Stable bilateral midlung scarring is noted. Stable elevated left hemidiaphragm is noted. No acute pulmonary disease is noted. Bony thorax is unremarkable. IMPRESSION: Stable bilateral midlung scarring.  No acute abnormality seen. Electronically Signed   By: Marijo Conception, M.D.   On: 02/10/2017 16:27   Ct Head Wo Contrast  Result Date: 02/10/2017 CLINICAL DATA:  Left-sided numbness. EXAM: CT HEAD WITHOUT CONTRAST TECHNIQUE: Contiguous axial images were obtained from the base of the skull through the vertex without intravenous contrast. COMPARISON:  None. FINDINGS: Brain: Mild  chronic ischemic white matter disease is noted. No mass effect or midline shift is noted. Focal high density is seen in superior portion of left parietal cortex concerning for intraparenchymal hemorrhage or contusion. No mass lesion or acute infarction is noted. Vascular: No hyperdense vessel or unexpected calcification. Skull: Normal. Negative for fracture or focal lesion. Sinuses/Orbits: Mild bilateral ethmoid sinusitis is noted. Other: None. IMPRESSION: Mild chronic ischemic white matter disease. Probable focal intraparenchymal hemorrhage or contusion seen involving superior portion of left parietal cortex. MRI is recommended for further evaluation. Critical Value/emergent results were called by telephone at the time of interpretation on 02/10/2017 at 4:40 pm to Dr. Davonna Belling , who verbally acknowledged these results. Electronically Signed   By: Marijo Conception, M.D.   On: 02/10/2017 16:40   Mr Brain Wo Contrast  Result Date: 02/10/2017 CLINICAL DATA:  LEFT facial numbness and chest pain today. History of stroke, end-stage renal disease on dialysis, diabetes, hypertension, hyperlipidemia. EXAM: MRI HEAD WITHOUT CONTRAST TECHNIQUE: Multiplanar, multiecho pulse sequences of the brain and surrounding structures were obtained without intravenous contrast. COMPARISON:  CT HEAD February 10, 2017 at 1617 hours FINDINGS: Multiple sequences are moderately motion degraded. BRAIN: No reduced diffusion to suggest acute ischemia. LEFT posterior frontal lobe susceptibility artifact corresponding to density on today's CT, with hemosiderin ring, no associated edema. Patchy bilateral cerebellar T2 hyperintensities. Old bilateral thalamus lacunar infarcts. Old LEFT pontine infarct. The ventricles and sulci are normal for patient's age. No suspicious parenchymal signal, mass or mass effect. No abnormal extra-axial fluid collections. VASCULAR: Normal major intracranial vascular flow voids present at skull base. SKULL AND  UPPER CERVICAL SPINE: No abnormal sellar expansion. No suspicious calvarial bone marrow signal. Craniocervical junction maintained. SINUSES/ORBITS: Posterior ethmoid opacification in mild paranasal sinus mucosal thickening. Mastoid air cells are well aerated. The included ocular globes and orbital contents are non-suspicious. OTHER: None. IMPRESSION: 1. No acute process on this moderately motion degraded examination. 2. 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 3. Multiple old cerebellar infarcts though, given rounded morphology, metastatic disease is possible. Recommend nonemergent follow-up MRI of the brain with contrast when patient is able to remain still. 4. Old lacunar infarcts and moderate chronic small vessel ischemic disease. Electronically Signed   By: Elon Alas M.D.   On: 02/10/2017 19:29    Microbiology: Recent Results (from the  past 240 hour(s))  MRSA PCR Screening     Status: None   Collection Time: 02/11/17  1:35 AM  Result Value Ref Range Status   MRSA by PCR NEGATIVE NEGATIVE Final    Comment:        The GeneXpert MRSA Assay (FDA approved for NASAL specimens only), is one component of a comprehensive MRSA colonization surveillance program. It is not intended to diagnose MRSA infection nor to guide or monitor treatment for MRSA infections.      Labs: CBC: Recent Labs  Lab 02/10/17 1554 02/11/17 0940 02/12/17 0437 02/13/17 0528  WBC 8.1 6.9 6.3 6.3  NEUTROABS 4.3  --   --   --   HGB 11.2* 9.4* 9.7* 9.7*  HCT 34.1* 28.2* 29.0* 29.1*  MCV 93.2 90.7 90.9 91.8  PLT 209 173 164 098   Basic Metabolic Panel: Recent Labs  Lab 02/10/17 1554 02/10/17 2048 02/11/17 0809 02/12/17 0437 02/13/17 0528  NA 136  --  134* 133* 132*  K 2.9*  --  3.3* 3.3* 3.5  CL 96*  --  98* 97* 97*  CO2 26  --  27 27 28   GLUCOSE 245*  --  85 106* 83  BUN 37*  --  44* 51* 25*  CREATININE 3.48*  --  4.57* 5.58* 3.86*  CALCIUM 8.1*   --  7.7* 7.6* 7.5*  MG  --  1.6* 1.8 1.9  --   PHOS  --   --   --  4.8* 3.3   Liver Function Tests: Recent Labs  Lab 02/11/17 0809 02/12/17 0437 02/13/17 0528  AST 15  --   --   ALT 11*  --   --   ALKPHOS 49  --   --   BILITOT 0.8  --   --   PROT 5.4*  --   --   ALBUMIN 2.3* 2.2* 2.2*   No results for input(s): LIPASE, AMYLASE in the last 168 hours. No results for input(s): AMMONIA in the last 168 hours. Cardiac Enzymes: Recent Labs  Lab 02/10/17 1554 02/10/17 2048 02/11/17 0809 02/11/17 1336 02/11/17 1940  TROPONINI 0.03* 0.03* <0.03 <0.03 <0.03   BNP (last 3 results) No results for input(s): BNP in the last 8760 hours. CBG: Recent Labs  Lab 02/12/17 1704 02/12/17 2032 02/13/17 0756 02/13/17 0902 02/13/17 1107  GLUCAP 83 115* 71 104* 80   Time spent: 35 minutes  Signed:  Berle Mull  [facility]  02/13/2017  , 4:54 PM

## 2017-02-13 NOTE — Progress Notes (Signed)
OT Cancellation Note  Patient Details Name: Alan [name] MRN: [id number] DOB: 1940/05/29   Cancelled Treatment:    Reason Eval/Treat Not Completed: OT screened, no needs identified, [name] sign off. Pt was finishing up getting dressed with RN staff as OT entered. Pt has no questions or concerns about ADL in home setting and has ample assist from his wife and son. Pt had no needs or concerns at this time, OT to sign off.  [place] 02/13/2017, 4:39 PM  Hulda Humphrey OTR/L [phone]

## 2017-02-13 NOTE — Progress Notes (Signed)
Patient received discharge instructions with daughter at bedside. Explained to daughter the new medication the patient Alan [name] be on and to follow-up with doctor soon. Verbalized understanding. Provided hand-out on new medication. Patient and daughter had no further questions. Peripheral IV removed. Escorted out via wheelchair by Chartered certified accountant.

## 2017-02-13 NOTE — Progress Notes (Signed)
[facility] Progress Note  Subjective: had heart cath which showed 2/2 100% occluded grafts, moderate native disease. Medical Rx.  No c/o today, no chest pain.    Vitals:   02/13/17 0536 02/13/17 0759 02/13/17 0800 02/13/17 0942  BP: (!) 119/51 (!) 150/52 (!) 150/52   Pulse: (!) 54 (!) 51  (!) 50  Resp: 15 13    Temp: 98.2 F (36.8 C) 97.6 F (36.4 C)    TempSrc: Oral Axillary    SpO2: 96% 99%    Weight: 74.5 kg (164 lb 3.9 oz)     Height:        Inpatient medications: .  stroke: mapping our early stages of recovery book   Does not apply Once  . aspirin EC  81 mg Oral QHS  . cinacalcet  30 mg Oral QHS  . diphenhydrAMINE  25 mg Oral QHS  . heparin  5,000 Units Subcutaneous Q8H  . insulin aspart  0-5 Units Subcutaneous QHS  . insulin aspart  0-9 Units Subcutaneous TID WC  . insulin glargine  8 Units Subcutaneous QHS  . isosorbide mononitrate  15 mg Oral Daily  . metoprolol tartrate  25 mg Oral BID  . multivitamin  1 tablet Oral QPM  . pantoprazole  40 mg Oral Daily  . sodium chloride flush  3 mL Intravenous Q12H   . sodium chloride     sodium chloride, acetaminophen, albuterol, hydrALAZINE, morphine injection, nitroGLYCERIN, ondansetron (ZOFRAN) IV, sodium chloride flush  Exam: Gen alert, no distress No rash, cyanosis or gangrene Sclera anicteric, throat clear  No jvd or bruits Chest clear bilat RRR no MRG, sternotomy scar well healed Abd soft ntnd no mass or ascites +bs GU normal male MS no joint effusions or deformity Ext bilat BKA, no edema, no wounds or ulcers Neuro is alert, Ox 3 , nf    Dialysis: [facility] [place] MWF  4h   76kg edw  Hep 2900    Impression: 1. Chest pain/ hx CABG - cath showed 2/2 100% occluded grafts, moderate native disease. Medical Rx. No CP today.  2. ESRD - HD MWF. Had HD yest.   HD tomorrow if still here.  3. HTN - BP's good 4. L face numbness - per primary, MRI brain neg for acute 5. IDDM 6. Anemia of CKD - Hb  10- 11 here 7. DIspo - per pt may be dc'd today   Plan - as above   Kelly Splinter MD [facility] pager [phone]   02/13/2017, 10:03 AM   Recent Labs  Lab 02/11/17 0809 02/12/17 0437 02/13/17 0528  NA 134* 133* 132*  K 3.3* 3.3* 3.5  CL 98* 97* 97*  CO2 27 27 28   GLUCOSE 85 106* 83  BUN 44* 51* 25*  CREATININE 4.57* 5.58* 3.86*  CALCIUM 7.7* 7.6* 7.5*  PHOS  --  4.8* 3.3   Recent Labs  Lab 02/11/17 0809 02/12/17 0437 02/13/17 0528  AST 15  --   --   ALT 11*  --   --   ALKPHOS 49  --   --   BILITOT 0.8  --   --   PROT 5.4*  --   --   ALBUMIN 2.3* 2.2* 2.2*   Recent Labs  Lab 02/10/17 1554 02/11/17 0940 02/12/17 0437 02/13/17 0528  WBC 8.1 6.9 6.3 6.3  NEUTROABS 4.3  --   --   --   HGB 11.2* 9.4* 9.7* 9.7*  HCT 34.1* 28.2* 29.0* 29.1*  MCV 93.2 90.7 90.9 91.8  PLT 209 173 164 161   Iron/TIBC/Ferritin/ %Sat    Component Value Date/Time   IRON 121 08/14/2015 0619   TIBC 203 (L) 08/14/2015 0619   FERRITIN 380 (H) 05/20/2012 0527   IRONPCTSAT 60 (H) 08/14/2015 6429

## 2017-02-13 NOTE — Progress Notes (Addendum)
The patient has been seen in conjunction with Rosaria Ferries, PA-C. All aspects of care have been considered and discussed. The patient has been personally interviewed, examined, and all clinical data has been reviewed.   Long-acting nitrates and low-dose beta-blocker therapy have been started.  These can be optimized to control angina assuming he is able to tolerate them in the setting of dialysis.  Okay to hold medications on days of dialysis.  From our perspective, the patient is ready for discharge.  Needs schedule appointment with follow-up cardiologist, Dr. Harl Bowie.   Progress Note  Patient Name: Alan [name] Date of Encounter: 02/13/2017  Primary Cardiologist: Dr. Harl Bowie  Subjective   No SOB overnight, had some CP, but mild.   Inpatient Medications    Scheduled Meds: .  stroke: mapping our early stages of recovery book   Does not apply Once  . aspirin EC  81 mg Oral QHS  . cinacalcet  30 mg Oral QHS  . diphenhydrAMINE  25 mg Oral QHS  . heparin  5,000 Units Subcutaneous Q8H  . Influenza vac split quadrivalent PF  0.5 mL Intramuscular Tomorrow-1000  . insulin aspart  0-5 Units Subcutaneous QHS  . insulin aspart  0-9 Units Subcutaneous TID WC  . insulin glargine  8 Units Subcutaneous QHS  . metoprolol tartrate  25 mg Oral BID  . multivitamin  1 tablet Oral QPM  . pantoprazole  40 mg Oral Daily  . pneumococcal 23 valent vaccine  0.5 mL Intramuscular Tomorrow-1000  . sodium chloride flush  3 mL Intravenous Q12H   Continuous Infusions: . sodium chloride     PRN Meds: sodium chloride, acetaminophen, albuterol, hydrALAZINE, morphine injection, nitroGLYCERIN, ondansetron (ZOFRAN) IV, sodium chloride flush   Vital Signs    Vitals:   02/12/17 1807 02/12/17 1948 02/12/17 2312 02/13/17 0536  BP: (!) 145/56 (!) 166/64 (!) 146/61 (!) 119/51  Pulse:  (!) 57 60 (!) 54  Resp: 17 17 13 15   Temp:  98.1 F (36.7 C)  98.2 F (36.8 C)  TempSrc:  Oral  Oral  SpO2:  100% 97%  96%  Weight:    164 lb 3.9 oz (74.5 kg)  Height:        Intake/Output Summary (Last 24 hours) at 02/13/2017 0735 Last data filed at 02/13/2017 0355 Gross per 24 hour  Intake 300 ml  Output 2375 ml  Net -2075 ml   Filed Weights   02/12/17 0740 02/12/17 1100 02/13/17 0536  Weight: 167 lb 8.8 oz (76 kg) 162 lb 11.2 oz (73.8 kg) 164 lb 3.9 oz (74.5 kg)    Telemetry    SR, No sig arrhythmia - Personally Reviewed  ECG    12/26, SB with 1st degree AV block  - Personally Reviewed  Physical Exam   GEN: NAD, lying flat  Neck: minimal JVD 2nd position Cardiac: RRR, 1-2/6 SEM Respiratory: CTA bilaterally GI: Soft, nontender, non-distended  MS: No edema; s/p bilateral BKA, R groin cath site w/out ecchymosis or hematoma, +bilateral fem bruits Neuro:  No new deficits Psych: normal affect  Labs    Chemistry Recent Labs  Lab 02/11/17 0809 02/12/17 0437 02/13/17 0528  NA 134* 133* 132*  K 3.3* 3.3* 3.5  CL 98* 97* 97*  CO2 27 27 28   GLUCOSE 85 106* 83  BUN 44* 51* 25*  CREATININE 4.57* 5.58* 3.86*  CALCIUM 7.7* 7.6* 7.5*  PROT 5.4*  --   --   ALBUMIN 2.3* 2.2* 2.2*  AST 15  --   --  ALT 11*  --   --   ALKPHOS 49  --   --   BILITOT 0.8  --   --   GFRNONAA 11* 9* 14*  GFRAA 13* 10* 16*  ANIONGAP 9 9 7      Hematology Recent Labs  Lab 02/11/17 0940 02/12/17 0437 02/13/17 0528  WBC 6.9 6.3 6.3  RBC 3.11* 3.19* 3.17*  HGB 9.4* 9.7* 9.7*  HCT 28.2* 29.0* 29.1*  MCV 90.7 90.9 91.8  MCH 30.2 30.4 30.6  MCHC 33.3 33.4 33.3  RDW 13.3 13.4 13.5  PLT 173 164 161    Cardiac Enzymes Recent Labs  Lab 02/10/17 2048 02/11/17 0809 02/11/17 1336 02/11/17 1940  TROPONINI 0.03* <0.03 <0.03 <0.03    Radiology    No results found.  Cardiac Studies   CATH: 02/12/2017  Mid RCA lesion is 50% stenosed.  2nd RPLB lesion is 90% stenosed.  Ost 1st Mrg to 1st Mrg lesion is 70% stenosed.  Origin lesion is 100% stenosed.  Origin to Prox Graft lesion is 100%  stenosed.  The left ventricular systolic function is normal.  LV end diastolic pressure is normal.  The left ventricular ejection fraction is 55-65% by visual estimate. Diagnostic Diagram         02/11/17 Echo Study Conclusions - Left ventricle: The cavity size was normal. There was mild   concentric hypertrophy. Systolic function was normal. The   estimated ejection fraction was in the range of 55% to 60%. There   is akinesis of the basalinferoseptal myocardium. Features are   consistent with a pseudonormal left ventricular filling pattern,   with concomitant abnormal relaxation and increased filling   pressure (grade 2 diastolic dysfunction). - Aortic valve: Trileaflet; moderately thickened, moderately   calcified leaflets. - Left atrium: Volume/bsa, S: 31.9 ml/m^2.  Patient Profile     76 y.o. 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   Assessment & Plan    Crescendo Angina   Initially w/ mild trop elevation - s/p cath w/ mod dz, med rx, see above - pt at risk for small vessel dz - add Imdur 15 mg qd, may need to take after HD on HD days  CAD with hx of CABG  - see above   ESRD with HD  - continue MWF schedule  Hypokalemia -  - was 3.3, now 3.5 after HD, per IM/Renal team  HTN urgency  - SBP > 200 on admission, improved now - SBP 112-166 last 24 hr, think he Yoan tolerate low-dose Imdur - Otherwise, per IM and renal teams  DM-2  - per IM   PAD  - s/p bilateral BKA - bilat fem bruits heard, no problems w/ R groin cath site.     For questions or updates, please contact [place] Please consult [email or web address] for contact info under Cardiology/STEMI.      Signed, Rosaria Ferries, PA-C  02/13/2017, 7:35 AM

## 2017-02-13 NOTE — Discharge Summary (Signed)
The patient has been seen in conjunction with Rosaria Ferries, NP-C. All aspects of care have been considered and discussed. The patient has been personally interviewed, examined, and all clinical data has been reviewed.   Bypass graft failure notd and Bridger be best treated with medical therapy as tolerated by dialysis related hemodynamics.  Discharge Summary    Patient ID: Alan [name],  MRN: [id number], DOB/AGE: 1940/11/17 76 y.o.  Admit date: 02/10/2017 Discharge date: 02/13/2017  Primary Care Provider: [facility] Primary Cardiologist: Dr. Harl Bowie  Discharge Diagnoses    Principal Problem:   Chest pain Active Problems:   CAD (coronary artery disease)   DM type 2 causing vascular disease ([place])   ESRD on dialysis [facility])   Essential hypertension   Left sided numbness   Hypertensive urgency   Hypokalemia   Allergies Allergies  Allergen Reactions  . Codeine Itching and Nausea And Vomiting  . Yellow [place]et Venom [Bee Venom] Swelling    Diagnostic Studies/Procedures    CATH: 02/12/2017  Mid RCA lesion is 50% stenosed.  2nd RPLB lesion is 90% stenosed.  Ost 1st Mrg to 1st Mrg lesion is 70% stenosed.  Origin lesion is 100% stenosed.  Origin to Prox Graft lesion is 100% stenosed.  The left ventricular systolic function is normal.  LV end diastolic pressure is normal.  The left ventricular ejection fraction is 55-65% by visual estimate. Diagnostic Diagram         02/11/17 Echo Study Conclusions - Left ventricle: The cavity size was normal. There was mild concentric hypertrophy. Systolic function was normal. The estimated ejection fraction was in the range of 55% to 60%. There is akinesis of the basalinferoseptal myocardium. Features are consistent with a pseudonormal left ventricular filling pattern, with concomitant abnormal relaxation and increased filling pressure (grade 2 diastolic dysfunction). - Aortic  valve: Trileaflet; moderately thickened, moderately calcified leaflets. - Left atrium: Volume/bsa, S: 31.9 ml/m^2.  _____________   History of Present Illness     76 y.o. 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  Hospital Course     Consultants: Nephrology and Cardiology saw the patient.  1. Crescendo Angina   - Initially w/ mild trop elevation, symptoms resolved with medication and have not been severe since then. - s/p cath w/ mod dz, med rx, see results above - pt at risk for small vessel dz, so we Kairav add nitrates - add Imdur 15 mg qd, may need to take after HD on HD days  2. CAD with hx of CABG  - see above   3. ESRD with HD  - continue MWF schedule - Nephrology managed his dialysis while he was here.    - He is on his usual schedule and should get dialysis tomorrow morning.  4. Hypokalemia -  - was 3.3, now 3.5 after HD, Nephrology managing  5. HTN urgency  - SBP > 200 on admission, improved now - SBP 112-166 last 24 hr, think he Camara tolerate low-dose Imdur - Otherwise, continue home medications  6. DM-2  - per IM  -Resume home medications  7. PAD  - s/p bilateral BKA - bilat fem bruits heard, no problems w/ R groin cath site.  -Continue to follow, call for problems with cath site.  8.  Bradycardia -He is tolerating a low dose of metoprolol, however 25 mg dose was  held this morning. -Discharge on 12.5 mg daily of Toprol-XL and follow heart rate as well as symptoms on this.  _____________  Discharge Vitals Blood pressure (!) 139/52, pulse (!) 50, temperature 98.5 F (36.9 C), temperature source Oral, resp. rate 13, height 5\' 11"  (1.803 m), weight 164 lb 3.9 oz (74.5 kg), SpO2 100 %.  Filed Weights   02/12/17 0740 02/12/17 1100 02/13/17 0536  Weight: 167 lb 8.8 oz (76 kg) 162 lb 11.2 oz (73.8 kg) 164 lb 3.9 oz (74.5 kg)     Labs & Radiologic Studies    CBC Recent Labs    02/10/17 1554  02/12/17 0437 02/13/17 0528  WBC 8.1   < > 6.3 6.3  NEUTROABS 4.3  --   --   --   HGB 11.2*   < > 9.7* 9.7*  HCT 34.1*   < > 29.0* 29.1*  MCV 93.2   < > 90.9 91.8  PLT 209   < > 164 161   < > = values in this interval not displayed.   Basic Metabolic Panel Recent Labs    02/11/17 0809 02/12/17 0437 02/13/17 0528  NA 134* 133* 132*  K 3.3* 3.3* 3.5  CL 98* 97* 97*  CO2 27 27 28   GLUCOSE 85 106* 83  BUN 44* 51* 25*  CREATININE 4.57* 5.58* 3.86*  CALCIUM 7.7* 7.6* 7.5*  MG 1.8 1.9  --   PHOS  --  4.8* 3.3   Liver Function Tests Recent Labs    02/11/17 0809 02/12/17 0437 02/13/17 0528  AST 15  --   --   ALT 11*  --   --   ALKPHOS 49  --   --   BILITOT 0.8  --   --   PROT 5.4*  --   --   ALBUMIN 2.3* 2.2* 2.2*    Cardiac Enzymes Recent Labs    02/11/17 0809 02/11/17 1336 02/11/17 1940  TROPONINI <0.03 <0.03 <0.03   Hemoglobin A1C Recent Labs    02/10/17 2048  HGBA1C 6.7*   Fasting Lipid Panel Recent Labs    02/11/17 0809  CHOL 224*  HDL 39*  LDLCALC 168*  TRIG 86  CHOLHDL 5.7   _____________  Dg Chest 2 View  Result Date: 02/10/2017 CLINICAL DATA:  Chest pain. EXAM: CHEST  2 VIEW COMPARISON:  Radiograph of June 15, 2016. FINDINGS: Stable cardiomediastinal silhouette. No pneumothorax or pleural effusion is noted. Stable bilateral midlung scarring is noted. Stable elevated left hemidiaphragm is noted. No acute pulmonary disease is noted. Bony thorax is unremarkable. IMPRESSION: Stable bilateral midlung scarring.  No acute abnormality seen. Electronically Signed   By: Marijo Conception, M.D.   On: 02/10/2017 16:27   Ct Head Wo Contrast  Result Date: 02/10/2017 CLINICAL DATA:  Left-sided numbness. EXAM: CT HEAD WITHOUT CONTRAST TECHNIQUE: Contiguous axial images were obtained from the base of the skull through the vertex without intravenous contrast. COMPARISON:  None. FINDINGS:  Brain: Mild chronic ischemic white matter disease is noted. No mass effect or midline shift is noted. Focal high density is seen in superior portion of left parietal cortex concerning for intraparenchymal hemorrhage or contusion. No mass lesion or acute infarction is noted. Vascular: No hyperdense vessel or unexpected calcification. Skull: Normal. Negative for fracture or focal lesion. Sinuses/Orbits: Mild bilateral ethmoid sinusitis is noted. Other: None. IMPRESSION: Mild chronic ischemic white matter disease. Probable focal intraparenchymal hemorrhage or contusion seen involving superior portion of left parietal cortex. MRI is recommended  for further evaluation. Critical Value/emergent results were called by telephone at the time of interpretation on 02/10/2017 at 4:40 pm to Dr. Davonna Belling , who verbally acknowledged these results. Electronically Signed   By: Marijo Conception, M.D.   On: 02/10/2017 16:40   Mr Brain Wo Contrast  Result Date: 02/10/2017 CLINICAL DATA:  LEFT facial numbness and chest pain today. History of stroke, end-stage renal disease on dialysis, diabetes, hypertension, hyperlipidemia. EXAM: MRI HEAD WITHOUT CONTRAST TECHNIQUE: Multiplanar, multiecho pulse sequences of the brain and surrounding structures were obtained without intravenous contrast. COMPARISON:  CT HEAD February 10, 2017 at 1617 hours FINDINGS: Multiple sequences are moderately motion degraded. BRAIN: No reduced diffusion to suggest acute ischemia. LEFT posterior frontal lobe susceptibility artifact corresponding to density on today's CT, with hemosiderin ring, no associated edema. Patchy bilateral cerebellar T2 hyperintensities. Old bilateral thalamus lacunar infarcts. Old LEFT pontine infarct. The ventricles and sulci are normal for patient's age. No suspicious parenchymal signal, mass or mass effect. No abnormal extra-axial fluid collections. VASCULAR: Normal major intracranial vascular flow voids present at skull  base. SKULL AND UPPER CERVICAL SPINE: No abnormal sellar expansion. No suspicious calvarial bone marrow signal. Craniocervical junction maintained. SINUSES/ORBITS: Posterior ethmoid opacification in mild paranasal sinus mucosal thickening. Mastoid air cells are well aerated. The included ocular globes and orbital contents are non-suspicious. OTHER: None. IMPRESSION: 1. No acute process on this moderately motion degraded examination. 2. 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 3. Multiple old cerebellar infarcts though, given rounded morphology, metastatic disease is possible. Recommend nonemergent follow-up MRI of the brain with contrast when patient is able to remain still. 4. Old lacunar infarcts and moderate chronic small vessel ischemic disease. Electronically Signed   By: Elon Alas M.D.   On: 02/10/2017 19:29   Disposition   Pt is being discharged home today in good condition.  Follow-up Plans & Appointments    Follow-up Information    [facility] Schedule an appointment as soon as possible for a visit in 1 week(s).   Contact information: [address] [place] [address] [phone]        Erma Heritage, PA-C Follow up on 03/03/2017.   Specialties:  Physician Assistant, Cardiology Why:  Please arrive at 2:45 PM for 3:00 PM appointment Contact information: [place] [address] [phone]          Discharge Instructions    Diet renal [id number]   Complete by:  As directed    Discharge instructions   Complete by:  As directed    It is important that you read following instructions as well as go over your medication list with RN to help you understand your care after this hospitalization.  Discharge Instructions: Please follow-up with PCP in one week  Please request your primary care physician to go over all Hospital Tests and Procedure/Radiological results at the  follow up,  Please get all Hospital records sent to your PCP by signing hospital release before you go home.   Do not take more than prescribed Pain, Sleep and Anxiety Medications. You were cared for by a hospitalist during your hospital stay. If you have any questions about your discharge medications or the care you received while you were in the hospital after you are discharged, you can call the unit and ask to speak with the hospitalist on call if the hospitalist that took care of you is not available.  Once  you are discharged, your primary care physician [facility] handle any further medical issues. Please note that NO REFILLS for any discharge medications [place] be authorized once you are discharged, as it is imperative that you return to your primary care physician (or establish a relationship with a primary care physician if you do not have one) for your aftercare needs so that they can reassess your need for medications and monitor your lab values. You Must read complete instructions/literature along with all the possible adverse reactions/side effects for all the Medicines you take and that have been prescribed to you. Take any new Medicines after you have completely understood and accept all the possible adverse reactions/side effects. Wear Seat belts while driving. If you have smoked or chewed Tobacco in the last 2 yrs please stop smoking and/or stop any Recreational drug use.   Increase activity slowly   Complete by:  As directed       Discharge Medications   Allergies as of 02/13/2017      Reactions   Codeine Itching, Nausea And Vomiting   Yellow Jacket Venom [bee Venom] Swelling      Medication List    TAKE these medications   ACCU-CHEK AVIVA PLUS test strip Generic drug:  glucose blood   acetaminophen 500 MG tablet Commonly known as:  TYLENOL Take 1,000 mg by mouth every 8 (eight) hours as needed for mild pain or moderate pain.   albuterol 108 (90 Base) MCG/ACT  inhaler Commonly known as:  PROVENTIL HFA;VENTOLIN HFA Inhale 2 puffs into the lungs every 4 (four) hours as needed for wheezing or shortness of breath.   aspirin EC 81 MG tablet Take 81 mg by mouth at bedtime.   cinacalcet 30 MG tablet Commonly known as:  SENSIPAR Take 30 mg by mouth at bedtime.   CoQ10 100 MG Caps Take 100 mg by mouth at bedtime.   diphenhydrAMINE 25 mg capsule Commonly known as:  BENADRYL Take 25 mg by mouth at bedtime.   isosorbide mononitrate 30 MG 24 hr tablet Commonly known as:  IMDUR Take 0.5 tablets (15 mg total) by mouth daily. Start taking on:  02/14/2017   LANTUS SOLOSTAR 100 UNIT/ML Solostar Pen Generic drug:  Insulin Glargine Inject 8-10 Units into the skin every evening.   metoprolol succinate 25 MG 24 hr tablet Commonly known as:  TOPROL XL Take 0.5 tablets (12.5 mg total) by mouth daily.   NEPHRO-VITE PO Take 1 tablet by mouth every evening.   nitroGLYCERIN 0.4 MG SL tablet Commonly known as:  NITROSTAT Place 1 tablet (0.4 mg total) under the tongue every 5 (five) minutes as needed for chest pain.   omeprazole 20 MG capsule Commonly known as:  PRILOSEC Take 20 mg by mouth 2 (two) times daily before a meal.   PROSOL 20 % Soln every Monday, Wednesday, and Friday.   vitamin C 1000 MG tablet Take 1,000 mg by mouth daily.         Outstanding Labs/Studies   None  Duration of Discharge Encounter   Greater than 30 minutes including physician time.  Alcario Drought Barrett NP 02/13/2017, 3:33 PM

## 2017-02-14 ENCOUNTER — Encounter (INDEPENDENT_AMBULATORY_CARE_PROVIDER_SITE_OTHER): Payer: Self-pay

## 2017-02-14 ENCOUNTER — Other Ambulatory Visit (INDEPENDENT_AMBULATORY_CARE_PROVIDER_SITE_OTHER): Payer: Self-pay | Admitting: Vascular Surgery

## 2017-02-27 ENCOUNTER — Other Ambulatory Visit: Payer: Self-pay | Admitting: *Deleted

## 2017-03-02 MED ORDER — CEFAZOLIN SODIUM-DEXTROSE 1-4 GM/50ML-% IV SOLN
1.0000 g | Freq: Once | INTRAVENOUS | Status: AC
  Administered 2017-03-03: 1 g via INTRAVENOUS

## 2017-03-03 ENCOUNTER — Encounter: Payer: Self-pay | Admitting: *Deleted

## 2017-03-03 ENCOUNTER — Encounter: Payer: Self-pay | Admitting: Student

## 2017-03-03 ENCOUNTER — Ambulatory Visit: Payer: Medicare Other | Admitting: Student

## 2017-03-03 ENCOUNTER — Ambulatory Visit
Admission: RE | Admit: 2017-03-03 | Discharge: 2017-03-03 | Disposition: A | Discharge status: Home / Self Care | Payer: Medicare Other | Source: Ambulatory Visit | Attending: Vascular Surgery | Admitting: Vascular Surgery

## 2017-03-03 ENCOUNTER — Encounter
Admission: RE | Disposition: A | Discharge status: Home / Self Care | Payer: Self-pay | Source: Ambulatory Visit | Attending: Vascular Surgery

## 2017-03-03 DIAGNOSIS — Z951 Presence of aortocoronary bypass graft: Secondary | ICD-10-CM | POA: Diagnosis not present

## 2017-03-03 DIAGNOSIS — Z9049 Acquired absence of other specified parts of digestive tract: Secondary | ICD-10-CM | POA: Insufficient documentation

## 2017-03-03 DIAGNOSIS — M5030 Other cervical disc degeneration, unspecified cervical region: Secondary | ICD-10-CM | POA: Diagnosis not present

## 2017-03-03 DIAGNOSIS — Z8719 Personal history of other diseases of the digestive system: Secondary | ICD-10-CM | POA: Insufficient documentation

## 2017-03-03 DIAGNOSIS — I25810 Atherosclerosis of coronary artery bypass graft(s) without angina pectoris: Secondary | ICD-10-CM | POA: Insufficient documentation

## 2017-03-03 DIAGNOSIS — Z87891 Personal history of nicotine dependence: Secondary | ICD-10-CM | POA: Diagnosis not present

## 2017-03-03 DIAGNOSIS — Z885 Allergy status to narcotic agent status: Secondary | ICD-10-CM | POA: Diagnosis not present

## 2017-03-03 DIAGNOSIS — Z89511 Acquired absence of right leg below knee: Secondary | ICD-10-CM | POA: Insufficient documentation

## 2017-03-03 DIAGNOSIS — E119 Type 2 diabetes mellitus without complications: Secondary | ICD-10-CM | POA: Diagnosis not present

## 2017-03-03 DIAGNOSIS — Y832 Surgical operation with anastomosis, bypass or graft as the cause of abnormal reaction of the patient, or of later complication, without mention of misadventure at the time of the procedure: Secondary | ICD-10-CM | POA: Insufficient documentation

## 2017-03-03 DIAGNOSIS — I120 Hypertensive chronic kidney disease with stage 5 chronic kidney disease or end stage renal disease: Secondary | ICD-10-CM | POA: Insufficient documentation

## 2017-03-03 DIAGNOSIS — E11649 Type 2 diabetes mellitus with hypoglycemia without coma: Secondary | ICD-10-CM | POA: Diagnosis not present

## 2017-03-03 DIAGNOSIS — M5136 Other intervertebral disc degeneration, lumbar region: Secondary | ICD-10-CM | POA: Insufficient documentation

## 2017-03-03 DIAGNOSIS — Z89512 Acquired absence of left leg below knee: Secondary | ICD-10-CM | POA: Diagnosis not present

## 2017-03-03 DIAGNOSIS — E0590 Thyrotoxicosis, unspecified without thyrotoxic crisis or storm: Secondary | ICD-10-CM | POA: Diagnosis not present

## 2017-03-03 DIAGNOSIS — I10 Essential (primary) hypertension: Secondary | ICD-10-CM | POA: Diagnosis not present

## 2017-03-03 DIAGNOSIS — Z809 Family history of malignant neoplasm, unspecified: Secondary | ICD-10-CM | POA: Insufficient documentation

## 2017-03-03 DIAGNOSIS — Z992 Dependence on renal dialysis: Secondary | ICD-10-CM | POA: Insufficient documentation

## 2017-03-03 DIAGNOSIS — Z955 Presence of coronary angioplasty implant and graft: Secondary | ICD-10-CM | POA: Diagnosis not present

## 2017-03-03 DIAGNOSIS — E7800 Pure hypercholesterolemia, unspecified: Secondary | ICD-10-CM | POA: Insufficient documentation

## 2017-03-03 DIAGNOSIS — Z8673 Personal history of transient ischemic attack (TIA), and cerebral infarction without residual deficits: Secondary | ICD-10-CM | POA: Diagnosis not present

## 2017-03-03 DIAGNOSIS — E1122 Type 2 diabetes mellitus with diabetic chronic kidney disease: Secondary | ICD-10-CM | POA: Diagnosis not present

## 2017-03-03 DIAGNOSIS — N186 End stage renal disease: Secondary | ICD-10-CM | POA: Diagnosis not present

## 2017-03-03 DIAGNOSIS — Z91030 Bee allergy status: Secondary | ICD-10-CM | POA: Insufficient documentation

## 2017-03-03 DIAGNOSIS — Z86718 Personal history of other venous thrombosis and embolism: Secondary | ICD-10-CM | POA: Insufficient documentation

## 2017-03-03 DIAGNOSIS — T82868A Thrombosis of vascular prosthetic devices, implants and grafts, initial encounter: Secondary | ICD-10-CM | POA: Diagnosis not present

## 2017-03-03 DIAGNOSIS — T82590A Other mechanical complication of surgically created arteriovenous fistula, initial encounter: Secondary | ICD-10-CM | POA: Insufficient documentation

## 2017-03-03 DIAGNOSIS — Z9889 Other specified postprocedural states: Secondary | ICD-10-CM | POA: Diagnosis not present

## 2017-03-03 DIAGNOSIS — M1990 Unspecified osteoarthritis, unspecified site: Secondary | ICD-10-CM | POA: Diagnosis not present

## 2017-03-03 HISTORY — PX: A/V SHUNT INTERVENTION: CATH118220

## 2017-03-03 HISTORY — DX: Depression, unspecified: F32.A

## 2017-03-03 HISTORY — PX: A/V FISTULAGRAM: CATH118298

## 2017-03-03 HISTORY — DX: Major depressive disorder, single episode, unspecified: F32.9

## 2017-03-03 LAB — POTASSIUM (ARMC VASCULAR LAB ONLY): POTASSIUM (ARMC VASCULAR LAB): 4.4 (ref 3.5–5.1)

## 2017-03-03 LAB — GLUCOSE, CAPILLARY: Glucose-Capillary: 87 mg/dL (ref 65–99)

## 2017-03-03 SURGERY — A/V FISTULAGRAM
Anesthesia: Moderate Sedation

## 2017-03-03 MED ORDER — HEPARIN SODIUM (PORCINE) 1000 UNIT/ML IJ SOLN
INTRAMUSCULAR | Status: AC
Start: 2017-03-03 — End: ?
  Filled 2017-03-03: qty 1

## 2017-03-03 MED ORDER — MIDAZOLAM HCL 2 MG/2ML IJ SOLN
INTRAMUSCULAR | Status: DC | PRN
  Administered 2017-03-03: 1 mg via INTRAVENOUS
  Administered 2017-03-03: 2 mg via INTRAVENOUS

## 2017-03-03 MED ORDER — FENTANYL CITRATE (PF) 100 MCG/2ML IJ SOLN
INTRAMUSCULAR | Status: AC
  Filled 2017-03-03: qty 4

## 2017-03-03 MED ORDER — HEPARIN (PORCINE) IN NACL 2-0.9 UNIT/ML-% IJ SOLN
INTRAMUSCULAR | Status: AC
  Filled 2017-03-03: qty 1000

## 2017-03-03 MED ORDER — FENTANYL CITRATE (PF) 100 MCG/2ML IJ SOLN
INTRAMUSCULAR | Status: DC | PRN
  Administered 2017-03-03 (×2): 50 ug via INTRAVENOUS

## 2017-03-03 MED ORDER — ONDANSETRON HCL 4 MG/2ML IJ SOLN: 4.0000 mg | Freq: Four times a day (QID) | INTRAMUSCULAR | Status: DC | PRN

## 2017-03-03 MED ORDER — SODIUM CHLORIDE 0.9 % IV SOLN
INTRAVENOUS | Status: DC
  Administered 2017-03-03: 12:00:00 via INTRAVENOUS

## 2017-03-03 MED ORDER — MIDAZOLAM HCL 5 MG/5ML IJ SOLN
INTRAMUSCULAR | Status: AC
  Filled 2017-03-03: qty 10

## 2017-03-03 MED ORDER — HYDROMORPHONE HCL 1 MG/ML IJ SOLN: 1.0000 mg | Freq: Once | INTRAMUSCULAR | Status: DC | PRN

## 2017-03-03 MED ORDER — METHYLPREDNISOLONE SODIUM SUCC 125 MG IJ SOLR: 125.0000 mg | INTRAMUSCULAR | Status: DC | PRN

## 2017-03-03 MED ORDER — LIDOCAINE-EPINEPHRINE (PF) 1 %-1:200000 IJ SOLN
INTRAMUSCULAR | Status: AC
  Filled 2017-03-03: qty 30

## 2017-03-03 MED ORDER — FAMOTIDINE 20 MG PO TABS: 40.0000 mg | ORAL_TABLET | ORAL | Status: DC | PRN

## 2017-03-03 MED ORDER — HEPARIN SODIUM (PORCINE) 1000 UNIT/ML IJ SOLN
INTRAMUSCULAR | Status: DC | PRN
  Administered 2017-03-03: 3000 [IU] via INTRAVENOUS

## 2017-03-03 SURGICAL SUPPLY — 13 items
BALLN LUTONIX DCB 6X100X130 (BALLOONS) ×3
BALLN ULTRVRSE 4X80X75 (BALLOONS) ×3
BALLOON LUTONIX DCB 6X100X130 (BALLOONS) ×2 IMPLANT
BALLOON ULTRVRSE 4X80X75 (BALLOONS) ×2 IMPLANT
CANNULA 5F STIFF (CANNULA) ×3 IMPLANT
CATH BEACON 5 .035 65 KMP TIP (CATHETERS) ×3 IMPLANT
COVER PROBE U/S 5X48 (MISCELLANEOUS) ×3 IMPLANT
DEVICE PRESTO INFLATION (MISCELLANEOUS) ×3 IMPLANT
DRAPE BRACHIAL (DRAPES) ×3 IMPLANT
PACK ANGIOGRAPHY (CUSTOM PROCEDURE TRAY) ×3 IMPLANT
SHEATH BRITE TIP 6FRX5.5 (SHEATH) ×3 IMPLANT
SUT MNCRL AB 4-0 PS2 18 (SUTURE) ×3 IMPLANT
WIRE MAGIC TOR.035 180C (WIRE) ×3 IMPLANT

## 2017-03-03 NOTE — Progress Notes (Signed)
Patient clinically stable  Post fistulogram per Dr Lucky Cowboy, vitals stable with wife present, denies complaints, discharge teaching done with questions answered. Dr Lucky Cowboy out to speak with patient and wife with questions answered.

## 2017-03-03 NOTE — Discharge Instructions (Signed)

## 2017-03-03 NOTE — Progress Notes (Deleted)
Cardiology Office Note    Date:  03/03/2017   ID:  Alan [name], DOB Oct 05, 1940, MRN [id number]  PCP:  [facility]  Cardiologist: Dr. Harl Bowie  No chief complaint on file.   History of Present Illness:    Alan [name] is a 77 y.o. male with past medical history of CAD (s/p CABG in 1996 with LIMA-LAD and SVG-OM1), HTN, HLD, Type 2 DM, PVD (s/p bilateral BKA), ESRD (on HD) and prior CVA who presents to the office today for hospital follow-up.   He was recently admitted to [facility] from 12/24 -  02/13/2017 for evaluation of chest pain. Cyclic enzymes remained flat and EKG showed no acute ischemic changes. Due to his symptoms being concerning for unstable angina, a cardiac catheterization was recommended for definitive evaluation. This was performed on 12/26 and showed an occluded graft to OM1 and atretic LIMA-LAD with diffuse disease along 1st Mrg and PDA branches of RCA with medical therapy recommended. Therefore, Imdur 15mg  daily and Toprol-XL 12.5mg  daily were added to his medication regimen.      Past Medical History:  Diagnosis Date  . Arthritis   . Asthma   . Clotted renal dialysis arteriovenous graft ([place])   . Coronary artery disease 2000   Stents [facility]  . CVA (cerebral infarction)   . DDD (degenerative disc disease), cervical   . DDD (degenerative disc disease), lumbar   . Diabetes mellitus   . ESRD (end stage renal disease) on dialysis ([place])   . Gangrene of toe ([place]) Feb. 2015   Right  great    . Gastric ulcer    EGD 2014  . Gout   . Hypercholesteremia   . Hypertension   . Stroke ([place])   . 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 Thrombosis of renal dialysis arteriovenous graft [facility])     Past Surgical History:  Procedure Laterality Date  . A/V FISTULAGRAM Left 05/28/2016   Procedure: A/V Fistulagram;  Surgeon: Katha Cabal, MD;  Location: [place] [facility];  Service: Cardiovascular;  Laterality: Left;  . A/V FISTULAGRAM Left  10/28/2016   Procedure: A/V Fistulagram;  Surgeon: Algernon Huxley, MD;  Location: [place] [facility];  Service: Cardiovascular;  Laterality: Left;  . A/V SHUNT INTERVENTION N/A 05/28/2016   Procedure: A/V Shunt Intervention;  Surgeon: Katha Cabal, MD;  Location: [place] [facility];  Service: Cardiovascular;  Laterality: N/A;  . AMPUTATION Right 04/10/2013   Procedure: Right Below Knee Amputation;  Surgeon: Newt Minion, MD;  Location: [place];  Service: Orthopedics;  Laterality: Right;  Right Below Knee Amputation  . APPENDECTOMY    . BELOW KNEE LEG AMPUTATION Bilateral   . CHOLECYSTECTOMY    . CORONARY ARTERY BYPASS GRAFT  1996   [place]  . ESOPHAGOGASTRODUODENOSCOPY Left 05/24/2012   JAS:[id number] dilated baggy but otherwise a normal/ Small hiatal hernia. Gastric ulcer -S/P biopsy  . LEFT HEART CATH AND CORS/GRAFTS ANGIOGRAPHY N/A 02/12/2017   Procedure: LEFT HEART CATH AND CORS/GRAFTS ANGIOGRAPHY;  Surgeon: Lorretta Harp, MD;  Location: [place] [facility];  Service: Cardiovascular;  Laterality: N/A;  . PERIPHERAL VASCULAR CATHETERIZATION N/A 08/09/2014   Procedure: A/V Shuntogram/Fistulagram;  Surgeon: Katha Cabal, MD;  Location: [place] [facility];  Service: Cardiovascular;  Laterality: N/A;  . PERIPHERAL VASCULAR CATHETERIZATION Left 08/09/2014   Procedure: A/V Shunt Intervention;  Surgeon: Katha Cabal, MD;  Location: [place] [facility];  Service: Cardiovascular;  Laterality: Left;  Current Medications: Outpatient Medications Prior to Visit  Medication Sig Dispense Refill  . ACCU-CHEK AVIVA PLUS test strip     . acetaminophen (TYLENOL) 500 MG tablet Take 1,000 mg by mouth every 8 (eight) hours as needed for mild pain or moderate pain.     Marland Kitchen amLODipine (NORVASC) 5 MG tablet Take 5 mg by mouth daily.    . Ascorbic Acid (VITAMIN C) 1000 MG tablet Take 1,000 mg by mouth daily.    Marland Kitchen aspirin EC 81 MG tablet Take 81 mg by mouth daily.     . B Complex-C-Folic Acid  (NEPHRO-VITE PO) Take 1 tablet by mouth at bedtime.     . budesonide-formoterol (SYMBICORT) 80-4.5 MCG/ACT inhaler Inhale 2 puffs into the lungs 2 (two) times daily as needed (for wheezing/shortness of breath.).    Marland Kitchen cinacalcet (SENSIPAR) 30 MG tablet Take 30 mg by mouth at bedtime.     . Coenzyme Q10 (COQ10) 100 MG CAPS Take 100 mg by mouth at bedtime.    . diphenhydrAMINE (BENADRYL) 25 mg capsule Take 25 mg by mouth at bedtime.     . isosorbide mononitrate (IMDUR) 30 MG 24 hr tablet Take 0.5 tablets (15 mg total) by mouth daily. 30 tablet 0  . LANTUS SOLOSTAR 100 UNIT/ML Solostar Pen Inject 9 Units into the skin at bedtime.     . Liniments (SALONPAS PAIN RELIEF PATCH EX) Place 1 patch onto the skin daily as needed (for pain relief.).     Marland Kitchen metoprolol succinate (TOPROL XL) 25 MG 24 hr tablet Take 0.5 tablets (12.5 mg total) by mouth daily. (Patient taking differently: Take 12.5 mg by mouth every Monday, Wednesday, and Friday. In the afternoon after dialysis.) 30 tablet 0  . nitroGLYCERIN (NITROSTAT) 0.4 MG SL tablet Place 1 tablet (0.4 mg total) under the tongue every 5 (five) minutes as needed for chest pain. (Patient not taking: Reported on 02/27/2017) 30 tablet 0  . omeprazole (PRILOSEC) 20 MG capsule Take 20 mg by mouth 2 (two) times daily before a meal.      Facility-Administered Medications Prior to Visit  Medication Dose Route Frequency Provider Last Rate Last Dose  . ceFAZolin (ANCEF) IVPB 1 g/50 mL premix  1 g Intravenous Once Stegmayer, Kimberly A, PA-C         Allergies:   Codeine and Yellow jacket venom [bee venom]   Social History   Socioeconomic History  . Marital status: Married    Spouse name: Not on file  . Number of children: Not on file  . Years of education: Not on file  . Highest education level: Not on file  Social Needs  . Financial resource strain: Not on file  . Food insecurity - worry: Not on file  . Food insecurity - inability: Not on file  . Transportation  needs - medical: Not on file  . Transportation needs - non-medical: Not on file  Occupational History  . Not on file  Tobacco Use  . Smoking status: Former Smoker    Last attempt to quit: 08/08/2004    Years since quitting: 12.5  . Smokeless tobacco: Never Used  Substance and Sexual Activity  . Alcohol use: No  . Drug use: No  . Sexual activity: Not Currently  Other Topics Concern  . Not on file  Social History Narrative  . Not on file     Family History:  The patient's ***family history includes Cancer in his mother.   Review of Systems:   Please  see the history of present illness.     General:  No chills, fever, night sweats or weight changes.  Cardiovascular:  No chest pain, dyspnea on exertion, edema, orthopnea, palpitations, paroxysmal nocturnal dyspnea. Dermatological: No rash, lesions/masses Respiratory: No cough, dyspnea Urologic: No hematuria, dysuria Abdominal:   No nausea, vomiting, diarrhea, bright red blood per rectum, melena, or hematemesis Neurologic:  No visual changes, wkns, changes in mental status. All other systems reviewed and are otherwise negative except as noted above.   Physical Exam:    VS:  There were no vitals taken for this visit.   General: Well developed, well nourished,male appearing in no acute distress. Head: Normocephalic, atraumatic, sclera non-icteric, no xanthomas, nares are without discharge.  Neck: No carotid bruits. JVD not elevated.  Lungs: Respirations regular and unlabored, without wheezes or rales.  Heart: ***Regular rate and rhythm. No S3 or S4.  No murmur, no rubs, or gallops appreciated. Abdomen: Soft, non-tender, non-distended with normoactive bowel sounds. No hepatomegaly. No rebound/guarding. No obvious abdominal masses. Msk:  Strength and tone appear normal for age. No joint deformities or effusions. Extremities: No clubbing or cyanosis. No edema.  Distal pedal pulses are 2+ bilaterally. Neuro: Alert and oriented X 3.  Moves all extremities spontaneously. No focal deficits noted. Psych:  Responds to questions appropriately with a normal affect. Skin: No rashes or lesions noted  Wt Readings from Last 3 Encounters:  02/13/17 164 lb 3.9 oz (74.5 kg)  10/28/16 161 lb (73 kg)  06/15/16 161 lb 13.1 oz (73.4 kg)        Studies/Labs Reviewed:   EKG:  EKG is*** ordered today.  The ekg ordered today demonstrates ***  Recent Labs: 02/11/2017: ALT 11 02/12/2017: Magnesium 1.9 02/13/2017: BUN 25; Creatinine, Ser 3.86; Hemoglobin 9.7; Platelets 161; Potassium 3.5; Sodium 132   Lipid Panel    Component Value Date/Time   CHOL 224 (H) 02/11/2017 0809   TRIG 86 02/11/2017 0809   HDL 39 (L) 02/11/2017 0809   CHOLHDL 5.7 02/11/2017 0809   VLDL 17 02/11/2017 0809   LDLCALC 168 (H) 02/11/2017 0809    Additional studies/ records that were reviewed today include:   Cardiac Catheterization: 02/12/2017  Mid RCA lesion is 50% stenosed.  2nd RPLB lesion is 90% stenosed.  Ost 1st Mrg to 1st Mrg lesion is 70% stenosed.  Origin lesion is 100% stenosed.  Origin to Prox Graft lesion is 100% stenosed.  The left ventricular systolic function is normal.  LV end diastolic pressure is normal.  The left ventricular ejection fraction is 55-65% by visual estimate.  IMPRESSION: Mr. Pinzon has occluded grafts including a graft that goes to a circumflex obtuse marginal branch and an atretic LIMA.  He does have a diffusely diseased first marginal branch which probably can be treated medically.  He has normal LV function.  The sheath Wah be removed and pressure held.  The patient left the lab in stable condition.  Echocardiogram: 02/11/2017 Study Conclusions  - Left ventricle: The cavity size was normal. There was mild   concentric hypertrophy. Systolic function was normal. The   estimated ejection fraction was in the range of 55% to 60%. There   is akinesis of the basalinferoseptal myocardium. Features are    consistent with a pseudonormal left ventricular filling pattern,   with concomitant abnormal relaxation and increased filling   pressure (grade 2 diastolic dysfunction). - Aortic valve: Trileaflet; moderately thickened, moderately   calcified leaflets. - Left atrium: Volume/bsa, S: 31.9 ml/m^2.  Assessment:    No diagnosis found.   Plan:   In order of problems listed above:  1. CAD - s/p CABG in 1996 with LIMA-LAD and SVG-OM1. Admitted in 01/2017 for evaluation of chest pain with catheterization at that time showing an occluded graft to OM1 and atretic LIMA-LAD with diffuse disease along 1st Mrg and PDA branches of RCA with medical therapy recommended.  2. HTN  3. HLD  4. PVD - s/p bilateral BKA  5. ESRD - on HD.    Medication Adjustments/Labs and Tests Ordered: Current medicines are reviewed at length with the patient today.  Concerns regarding medicines are outlined above.  Medication changes, Labs and Tests ordered today are listed in the Patient Instructions below. There are no Patient Instructions on file for this visit.   Signed, Erma Heritage, PA-C  03/03/2017 9:50 AM    [facility] [address] [place], [place] [address] Phone: [phone]

## 2017-03-03 NOTE — H&P (Signed)
[facility] Admission History & Physical  MRN : [id number]  Alan [name] is a 77 y.o. ([birth year]) male who presents with chief complaint of No chief complaint on file. Marland Kitchen  History of Present Illness: I am asked to evaluate the patient by the dialysis center. The patient was sent here because they were unable to achieve adequate dialysis this morning. Furthermore the Center states there is very poor thrill and bruit. The patient states there there have been increasing problems with the access, such as "pulling clots" during dialysis and prolonged bleeding after decannulation. The patient estimates these problems have been going on for several weeks. The patient is unaware of any other change.  Patient denies [name] or tenderness overlying the access.  There is no [name] with dialysis.  The patient denies hand [name] or finger [name] consistent with steal syndrome.   There have been past interventions or declots of this access.  The patient is not chronically hypotensive on dialysis.  Current Facility-Administered Medications  Medication Dose Route Frequency Provider Last Rate Last Dose  . 0.9 %  sodium chloride infusion   Intravenous Continuous Stegmayer, Kimberly A, PA-C      . ceFAZolin (ANCEF) IVPB 1 g/50 mL premix  1 g Intravenous Once Stegmayer, Kimberly A, PA-C      . famotidine (PEPCID) tablet 40 mg  40 mg Oral PRN Stegmayer, Janalyn Harder, PA-C      . HYDROmorphone (DILAUDID) injection 1 mg  1 mg Intravenous Once PRN Stegmayer, Kimberly A, PA-C      . methylPREDNISolone sodium succinate (SOLU-MEDROL) 125 mg/2 mL injection 125 mg  125 mg Intravenous PRN Stegmayer, Kimberly A, PA-C      . ondansetron (ZOFRAN) injection 4 mg  4 mg Intravenous Q6H PRN Stegmayer, Janalyn Harder, PA-C        Past Medical History:  Diagnosis Date  . Arthritis   . Asthma   . Clotted renal dialysis arteriovenous graft ([place])   . Coronary artery disease    a. s/p CABG in 1996 with LIMA-LAD and  SVG-OM1 b. 01/2017: cath showing occluded SVG-OM1 and atretic LIMA-LAD. Diffuse disease along 1st Mrg and PDA with medical therapy recommended.   . CVA (cerebral infarction)   . DDD (degenerative disc disease), cervical   . DDD (degenerative disc disease), lumbar   . Diabetes mellitus   . ESRD (end stage renal disease) on dialysis ([place])   . Gangrene of toe ([place]) Feb. 2015   Right  great    . Gastric ulcer    EGD 2014  . Gout   . Hypercholesteremia   . Hypertension   . Stroke ([place])   . 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 Thrombosis of renal dialysis arteriovenous graft [facility])     Past Surgical History:  Procedure Laterality Date  . A/V FISTULAGRAM Left 05/28/2016   Procedure: A/V Fistulagram;  Surgeon: Katha Cabal, MD;  Location: [facility];  Service: Cardiovascular;  Laterality: Left;  . A/V FISTULAGRAM Left 10/28/2016   Procedure: A/V Fistulagram;  Surgeon: Algernon Huxley, MD;  Location: [facility] [facility];  Service: Cardiovascular;  Laterality: Left;  . A/V SHUNT INTERVENTION N/A 05/28/2016   Procedure: A/V Shunt Intervention;  Surgeon: Katha Cabal, MD;  Location: [place] [facility];  Service: Cardiovascular;  Laterality: N/A;  . AMPUTATION Right 04/10/2013   Procedure: Right Below Knee Amputation;  Surgeon: Newt Minion, MD;  Location: [facility];  Service: Orthopedics;  Laterality: Right;  Right  Below Knee Amputation  . APPENDECTOMY    . BELOW KNEE LEG AMPUTATION Bilateral   . CHOLECYSTECTOMY    . CORONARY ARTERY BYPASS GRAFT  1996   [place]  . ESOPHAGOGASTRODUODENOSCOPY Left 05/24/2012   WJX:[id number] dilated baggy but otherwise a normal/ Small hiatal hernia. Gastric ulcer -S/P biopsy  . LEFT HEART CATH AND CORS/GRAFTS ANGIOGRAPHY N/A 02/12/2017   Procedure: LEFT HEART CATH AND CORS/GRAFTS ANGIOGRAPHY;  Surgeon: Lorretta Harp, MD;  Location: [place] [facility];  Service: Cardiovascular;  Laterality: N/A;  . PERIPHERAL VASCULAR CATHETERIZATION N/A 08/09/2014    Procedure: A/V Shuntogram/Fistulagram;  Surgeon: Katha Cabal, MD;  Location: [place] [facility];  Service: Cardiovascular;  Laterality: N/A;  . PERIPHERAL VASCULAR CATHETERIZATION Left 08/09/2014   Procedure: A/V Shunt Intervention;  Surgeon: Katha Cabal, MD;  Location: [facility] [facility];  Service: Cardiovascular;  Laterality: Left;    Social History Social History   Tobacco Use  . Smoking status: Former Smoker    Last attempt to quit: 08/08/2004    Years since quitting: 12.5  . Smokeless tobacco: Never Used  Substance Use Topics  . Alcohol use: No  . Drug use: No    Family History Family History  Problem Relation Age of Onset  . Cancer Mother   . Colon cancer Neg Hx     No family history of bleeding or clotting disorders, autoimmune disease or porphyria  Allergies  Allergen Reactions  . Codeine Itching and Nausea And Vomiting  . [place] Venom [Bee Venom] Swelling     REVIEW OF SYSTEMS (Negative unless checked)  Constitutional: [] Weight loss  [] Fever  [] Chills Cardiac: [] Chest [name]   [] Chest pressure   [x] Palpitations   [] Shortness of breath when laying flat   [] Shortness of breath at rest   [x] Shortness of breath with exertion. Vascular:  [] [name] in legs with walking   [] [name] in legs at rest   [] [name] in legs when laying flat   [] Claudication   [] [name] in feet when walking  [] [name] in feet at rest  [] [name] in feet when laying flat   [] History of DVT   [] Phlebitis   [x] Swelling in legs   [] Varicose veins   [] Non-healing ulcers Pulmonary:   [] Uses home oxygen   [] Productive cough   [] Hemoptysis   [] Wheeze  [] COPD   [x] Asthma Neurologic:  [] Dizziness  [] Blackouts   [] Seizures   [x] History of stroke   [] History of TIA  [] Aphasia   [] Temporary blindness   [] Dysphagia   [] Weakness or numbness in arms   [] Weakness or numbness in legs Musculoskeletal:  [x] Arthritis   [] Joint swelling   [x] Joint [name]   [] Low back [name] Hematologic:  [] Easy bruising  [] Easy bleeding    [] Hypercoagulable state   [] Anemic  [] Hepatitis Gastrointestinal:  [] Blood in stool   [] Vomiting blood  [] Gastroesophageal reflux/heartburn   [] Difficulty swallowing. Genitourinary:  [x] Chronic kidney disease   [] Difficult urination  [] Frequent urination  [] Burning with urination   [] Blood in urine Skin:  [] Rashes   [] Ulcers   [] Wounds Psychological:  [] History of anxiety   []  History of major depression.  Physical Examination  There were no vitals filed for this visit. There is no height or weight on file to calculate BMI. Gen: WD/WN, NAD Head: [place] [facility]/AT, No temporalis wasting Ear/Nose/Throat: Hearing grossly intact, nares w/o erythema or drainage, oropharynx w/o Erythema/Exudate,  Eyes: Conjunctiva clear, sclera non-icteric Neck: Trachea midline.  No JVD.  Pulmonary:  Good air movement, respirations not labored, no use  of accessory muscles.  Cardiac: irregular Vascular: weak thrill in access left arm Vessel Right Left  Radial Palpable Palpable               Musculoskeletal: weakness with some contractures in arm.  Wheelchair bound Neurologic: Sensation grossly intact in extremities.  Symmetrical.  Speech is poor. Psychiatric: Judgment intact, Mood & affect appropriate for pt's clinical situation. Dermatologic: No rashes or ulcers noted.  No cellulitis or open wounds.    CBC Lab Results  Component Value Date   WBC 6.3 02/13/2017   HGB 9.7 (L) 02/13/2017   HCT 29.1 (L) 02/13/2017   MCV 91.8 02/13/2017   PLT 161 02/13/2017    BMET    Component Value Date/Time   NA 132 (L) 02/13/2017 0528   NA 136 09/03/2012 0934   K 3.5 02/13/2017 0528   K 3.9 09/03/2012 0934   CL 97 (L) 02/13/2017 0528   CL 100 09/03/2012 0934   CO2 28 02/13/2017 0528   CO2 30 09/03/2012 0934   GLUCOSE 83 02/13/2017 0528   GLUCOSE 110 (H) 09/03/2012 0934   BUN 25 (H) 02/13/2017 0528   BUN 46 (H) 09/03/2012 0934   CREATININE 3.86 (H) 02/13/2017 0528   CREATININE 6.12 (H) 09/03/2012 0934   CALCIUM  7.5 (L) 02/13/2017 0528   CALCIUM 9.4 09/03/2012 0934   CALCIUM 7.7 (L) 05/20/2012 1530   GFRNONAA 14 (L) 02/13/2017 0528   GFRNONAA 8 (L) 09/03/2012 0934   GFRAA 16 (L) 02/13/2017 0528   GFRAA 10 (L) 09/03/2012 0934   CrCl cannot be calculated (Unknown ideal weight.).  COAG Lab Results  Component Value Date   INR 0.99 02/10/2017   INR 1.07 08/27/2015   INR 1.15 04/09/2013    Radiology Dg Chest 2 View  Result Date: 02/10/2017 CLINICAL DATA:  Chest [name]. EXAM: CHEST  2 VIEW COMPARISON:  Radiograph of June 15, 2016. FINDINGS: Stable cardiomediastinal silhouette. No pneumothorax or pleural effusion is noted. Stable bilateral midlung scarring is noted. Stable elevated left hemidiaphragm is noted. No acute pulmonary disease is noted. Bony thorax is unremarkable. IMPRESSION: Stable bilateral midlung scarring.  No acute abnormality seen. Electronically Signed   By: Marijo Conception, M.D.   On: 02/10/2017 16:27   Ct Head Wo Contrast  Result Date: 02/10/2017 CLINICAL DATA:  Left-sided numbness. EXAM: CT HEAD WITHOUT CONTRAST TECHNIQUE: Contiguous axial images were obtained from the base of the skull through the vertex without intravenous contrast. COMPARISON:  None. FINDINGS: Brain: Mild chronic ischemic white matter disease is noted. No mass effect or midline shift is noted. Focal high density is seen in superior portion of left parietal cortex concerning for intraparenchymal hemorrhage or contusion. No mass lesion or acute infarction is noted. Vascular: No hyperdense vessel or unexpected calcification. Skull: Normal. Negative for fracture or focal lesion. Sinuses/Orbits: Mild bilateral ethmoid sinusitis is noted. Other: None. IMPRESSION: Mild chronic ischemic white matter disease. Probable focal intraparenchymal hemorrhage or contusion seen involving superior portion of left parietal cortex. MRI is recommended for further evaluation. Critical Value/emergent results were called by telephone at the  time of interpretation on 02/10/2017 at 4:40 pm to Dr. Davonna Belling , who verbally acknowledged these results. Electronically Signed   By: Marijo Conception, M.D.   On: 02/10/2017 16:40   Mr Brain Wo Contrast  Result Date: 02/10/2017 CLINICAL DATA:  LEFT facial numbness and chest [name] today. History of stroke, end-stage renal disease on dialysis, diabetes, hypertension, hyperlipidemia. EXAM: MRI HEAD WITHOUT CONTRAST  TECHNIQUE: Multiplanar, multiecho pulse sequences of the brain and surrounding structures were obtained without intravenous contrast. COMPARISON:  CT HEAD February 10, 2017 at 1617 hours FINDINGS: Multiple sequences are moderately motion degraded. BRAIN: No reduced diffusion to suggest acute ischemia. LEFT posterior frontal lobe susceptibility artifact corresponding to density on today's CT, with hemosiderin ring, no associated edema. Patchy bilateral cerebellar T2 hyperintensities. Old bilateral thalamus lacunar infarcts. Old LEFT pontine infarct. The ventricles and sulci are normal for patient's age. No suspicious parenchymal signal, mass or mass effect. No abnormal extra-axial fluid collections. VASCULAR: Normal major intracranial vascular flow voids present at skull base. SKULL AND UPPER CERVICAL SPINE: No abnormal sellar expansion. No suspicious calvarial bone marrow signal. Craniocervical junction maintained. SINUSES/ORBITS: Posterior ethmoid opacification in mild paranasal sinus mucosal thickening. Mastoid air cells are well aerated. The included ocular globes and orbital contents are non-suspicious. OTHER: None. IMPRESSION: 1. No acute process on this moderately motion degraded examination. 2. 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 3. Multiple old cerebellar infarcts though, given rounded morphology, metastatic disease is possible. Recommend nonemergent follow-up MRI of the brain with contrast when patient is able to remain still. 4. Old  lacunar infarcts and moderate chronic small vessel ischemic disease. Electronically Signed   By: Elon Alas M.D.   On: 02/10/2017 19:29    Assessment/Plan 1.  Complication dialysis device with dysfunction of AV access:  Patient's left arm dialysis access is malfunctioning. The patient Alan [name] undergo angiography and correction of any problems using interventional techniques with the hope of restoring function to the access.  The risks and benefits were described to the patient.  All questions were answered.  The patient agrees to proceed with angiography and intervention. Potassium Jerri be drawn to ensure that it is an appropriate level prior to performing intervention. 2.  End-stage renal disease requiring hemodialysis:  Patient Alan [name] continue dialysis therapy without further interruption if a successful intervention is not achieved then a tunneled catheter Muad be placed. Dialysis has already been arranged. 3.  Hypertension:  Patient Alan [name] continue medical management; nephrology is following no changes in oral medications. 4.  Diabetes mellitus:  Glucose Alan [name] be monitored and oral medications been held this morning once the patient has undergone the patient's procedure po intake Alan [name] be reinitiated and again Accu-Cheks Kenn be used to assess the blood glucose level and treat as needed. The patient Alan [name] be restarted on the patient's usual hypoglycemic regime 5.  Coronary artery disease:  EKG Alan [name] be monitored. Nitrates Alan [name] be used if needed. The patient's oral cardiac medications Alan [name] be continued.    Alan Pain, MD  03/03/2017 11:16 AM

## 2017-03-03 NOTE — Op Note (Signed)
[facility]    OPERATIVE NOTE   PROCEDURE: 1.   Left radiocephalic arteriovenous fistula cannulation under ultrasound guidance 2.   Left arm fistulagram including central venogram 3.   Percutaneous transluminal angioplasty of perianastomotic cephalic vein and forearm cephalic vein with 6 mm diameter by 10 cm length Lutonix drug-coated angioplasty  PRE-OPERATIVE DIAGNOSIS: 1. ESRD 2. Poorly functional left radiocephalic AVF  POST-OPERATIVE DIAGNOSIS: same as above   SURGEON: Leotis Pain, MD  ANESTHESIA: local with MCS  ESTIMATED BLOOD LOSS: 5 cc  FINDING(S): 1. Diffuse hyperplasia and narrowing with multiple greater than 70% stenosis in the perianastomotic cephalic vein as well as the cephalic vein several centimeters beyond the anastomosis  SPECIMEN(S):  None  CONTRAST: 25 cc  FLUORO TIME: 1.8 minutes  MODERATE CONSCIOUS SEDATION TIME: Approximately 20 minutes with 3 mg of Versed and 100 mcg of Fentanyl   INDICATIONS: Alan [name] is a 77 y.o. male who presents with malfunctioning left radiocephalic arteriovenous fistula.  The patient is scheduled for left arm fistulagram.  The patient is aware the risks include but are not limited to: bleeding, infection, thrombosis of the cannulated access, and possible anaphylactic reaction to the contrast.  The patient is aware of the risks of the procedure and elects to proceed forward.  DESCRIPTION: After full informed written consent was obtained, the patient was brought back to the angiography suite and placed supine upon the angiography table.  The patient was connected to monitoring equipment. Moderate conscious sedation was administered with a face to face encounter with the patient throughout the procedure with my supervision of the RN administering medicines and monitoring the patient's vital signs and mental status throughout from the start of the procedure until the patient was taken to the recovery room. The  left arm was prepped and draped in the standard fashion for a percutaneous access intervention.  Under ultrasound guidance, the left radiocephalic arteriovenous fistula was cannulated with a micropuncture needle under direct ultrasound guidance in a retrograde fashion near the antecubital fossa and a permanent image was performed.  The microwire was advanced into the fistula and the needle was exchanged for the a microsheath.  I then upsized to a 6 Fr Sheath and imaging was performed.  Hand injections were completed to image the access including the central venous system. This demonstrated diffuse hyperplasia and narrowing with multiple greater than 70% stenosis in the perianastomotic cephalic vein as well as the cephalic vein several centimeters beyond the anastomosis.  Based on the images, this patient Alan [name] need intervention to this area. I then gave the patient 3000 units of intravenous heparin.  I then crossed the stenosis with a Magic Tourqe wire.  Based on the imaging, a 6 mm x 10 cm  Lutonix drug coated angioplasty balloon was selected.  The balloon was centered around the forearm cephalic vein and perianastomotic stenosis with the distal tip at the anastomosis and inflated to 12 ATM for 1 minute(s).  On completion imaging, a less than 20 % residual stenosis was present, but there was some mild extravasation.  I then inflated a smaller balloon using a 4 mm diameter by 10 cm length Lutonix drug-coated angioplasty balloon just into the radial artery and back into the cephalic vein across the anastomosis.  This was inflated to 4 atm for 2 minutes.  Completion angiogram following this showed no residual extravasation and only the mild less than 20% stenosis so I elected to terminate the procedure.     Based  on the completion imaging, no further intervention is necessary.  The wire and balloon were removed from the sheath.  A 4-0 Monocryl purse-string suture was sewn around the sheath.  The sheath was removed  while tying down the suture.  A sterile bandage was applied to the puncture site.  COMPLICATIONS: None  CONDITION: Stable   Leotis Pain  03/03/2017 1:00 PM   This note was created with Dragon Medical transcription system. Any errors in dictation are purely unintentional.

## 2017-03-04 ENCOUNTER — Encounter: Payer: Self-pay | Admitting: Student

## 2017-03-05 NOTE — Patient Outreach (Signed)
[facility]) Care Management   02/27/2017  Alan [name] 1941-02-06 [id number]  Alan [name] is an 77 y.o. 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   Alan [name] wife is my patient and she asked me to address some concerns about Alan [name]'s chronic care needs today.   Subjective: "He gets very emotional and has pains in his chest if he gets overwhelmed"(spouse)  Objective:  BP 130/76   Pulse 66   SpO2 98%   ROS  Physical Exam Encounter Medications:   Outpatient Encounter Medications as of 02/27/2017  Medication Sig Note  . acetaminophen (TYLENOL) 500 MG tablet Take 1,000 mg by mouth every 8 (eight) hours as needed for mild pain or moderate pain.    Marland Kitchen amLODipine (NORVASC) 5 MG tablet Take 5 mg by mouth daily.   Marland Kitchen aspirin EC 81 MG tablet Take 81 mg by mouth daily.    . B Complex-C-Folic Acid (NEPHRO-VITE PO) Take 1 tablet by mouth at bedtime.    . budesonide-formoterol (SYMBICORT) 80-4.5 MCG/ACT inhaler Inhale 2 puffs into the lungs 2 (two) times daily as needed (for wheezing/shortness of breath.).   Marland Kitchen cinacalcet (SENSIPAR) 30 MG tablet Take 30 mg by mouth at bedtime.    . Coenzyme Q10 (COQ10) 100 MG CAPS Take 100 mg by mouth at bedtime.   . diphenhydrAMINE (BENADRYL) 25 mg capsule Take 25 mg by mouth at bedtime.    . isosorbide mononitrate (IMDUR) 30 MG 24 hr tablet Take 0.5 tablets (15 mg total) by mouth daily.   Marland Kitchen LANTUS SOLOSTAR 100 UNIT/ML Solostar Pen Inject 9 Units into the skin at bedtime.    . Liniments (SALONPAS PAIN RELIEF PATCH EX) Place 1 patch onto the skin daily as needed (for pain relief.).    Marland Kitchen metoprolol succinate (TOPROL XL) 25 MG 24 hr tablet Take 0.5 tablets (12.5 mg total) by mouth daily. (Patient taking differently: Take 12.5 mg by mouth every Monday, Wednesday,  and Friday. In the afternoon after dialysis.)   . ACCU-CHEK AVIVA PLUS test strip    . Ascorbic Acid (VITAMIN C) 1000 MG tablet Take 1,000 mg by mouth daily. 02/20/2017: On hold until patient buys more.  . nitroGLYCERIN (NITROSTAT) 0.4 MG SL tablet Place 1 tablet (0.4 mg total) under the tongue every 5 (five) minutes as needed for chest pain. (Patient not taking: Reported on 02/27/2017)   . omeprazole (PRILOSEC) 20 MG capsule Take 20 mg by mouth 2 (two) times daily before a meal.     Fall/Depression Screening:    Fall Risk  02/27/2017  Falls in the past year? Yes  Number falls in past yr: 2 or more  Injury with Fall? No  Risk Factor Category  High Fall Risk  Risk for fall due to : History of fall(s);Impaired balance/gait;Impaired mobility;Other (Comment)  Risk for fall due to: Comment bilateral amputee, bed/chair bound  Follow up Falls evaluation completed;Education provided;Falls prevention discussed   PHQ 2/9 Scores 02/27/2017  PHQ - 2 Score 2  PHQ- 9 Score 5    Assessment:  Alan [name] is a 77 year old gentleman who lives in [place], [place] with his wife and adult son. Alan [name] recently was discharged from the hospital. His wife who is a retired [employer] manager and who recently discharged from the hospital after having suffered a SAH and ICH, is his  primary caregiver. Since her parents recent illness, Ms. Venetia Constable, Alan [name]. & Mrs. Mello's daughter, has been primary caregiver for both of her parents.   Home Care Needs - when I arrived today, Alan [name] was working with the home health physical therapist. He had also been visited by the home health nurse from [facility].   Mrs. Bacigalupo and daughter Venetia Constable are both concerned about Alan [name]'s care needs being met long term as Mrs. Ferrebee now has her own health care and home needs after her recent [place]. Ms. Tamala Julian has been providing care for Alan [name]. & Mrs. Friesen.   According to Mrs. Holford and Ms. Tamala Julian, Alan [name] does not qualify for  Medicaid (citing inability to meet income requirements). I inquired with Mrs. Kugel about whether they might be able to pay out of pocket for a few hours of care each day such as morning and bedtime custodial care. Mrs. Sumida feels this might be a strain but would be interested in at least inquiring about resources in the county.   Chronic Health Conditions - as noted above, Alan [name] has ESRD. He goes to [facility] 3 times weekly and is transported by the county transportation service. Today, Alan [name] complained of anterior chest pain. Mrs. Abood stated "he gets worked up if too many people come around". I helped Ms. Tamala Julian get Alan [name] to bed and he was able to close his eyes and sleep for approximately 20 minutes while I continued an interview with Mrs. Wordell and Ms. Smith. When he awakened he stated his chest pain was gone.   Ms. Schetter checks Alan [name] cbg's and bp but not routinely. We discussed today the importance of routine monitoring and recording. I provided a documentation tool today and provided hands on instruction about documentation.   Medication Management - Mrs. Rodin administers Alan [name] medications. I reviewed his medications in detail today with Mrs. Sontag and Ms. Smith. Please see detailed notation above re: Alan [name] medications.   Of note, Mrs. Bonser states she did not understand the instructions re: Metoprolol to be given M/W/F after dil[facility] and Imdur to be given on T/Th/Sat. We reviewed the prescribing instructions and I filled Alan [name]'s pil box with all his prescribed medications.   *NOTE: during my visit, Alan [name]. & Mrs. Motely's adult son left the home driving. A call was received approximately 40 minutes later. EMS was calling to report that Alan [name]. Sarkis's son experienced a seizure. He was aware that he was having difficulty and pulled over on the side of the road and called 911.**  Plan: I Fabrizzio follow up with Alan [name]'s primary care provider re: my visit  today  I Mayer inquire with [facility] regarding county resources for in home care/custodial care.   Mrs. Colston and Ms. Tamala Julian Eisa administer Alan [name]'s medication from the filled pill box. Ms. Tamala Julian took a photo of the filled pill box and took notes as we filled the box and stated she would help Mrs. Bisch with medication management for Alan [name]. Lehnen.   [facility] CM Care Plan Problem One     Most Recent Value  Care Plan Problem One  Medication Management Concerns  Role Documenting the Problem One  Care Management Coordinator  Care Plan for Problem One  Active  [facility] Long Term Goal   Over the next 60 days, patient's spouse and adult child Antwoine demonstrate ability to correctly administer prescribed medications  [facility] Long Term Goal Start Date  02/27/17  Interventions for Problem One Long Term Goal  medications reviewed in detail,  pill box provided and filled,  education provided  [facility] CM Short Term Goal #1   Over the next 30 days, patient's spouse and daughter Ashland correctly refill pill box as instructed  [facility] CM Short Term Goal #1 Start Date  02/27/17  Interventions for Short Term Goal #1  pill box filled,  education provided    [facility] CM Care Plan Problem Two     Most Recent Value  Care Plan Problem Two  Home Care/Custodial Care Needs  Role Documenting the Problem Two  Care Management [place] for Problem Two  Active  Interventions for Problem Two Long Term Goal   In person collaboration with HHPT,  telephone collaboration with home care agency nursing supervisor,  discussion with patient/family caregivers re: possible community resources  [facility] Long Term Goal  Over the next 31 days, patients' spouse/family Morty verablize understanding of home care/custodial care resources availabe to patient  [place] Goal Start Date  02/27/17  [facility] CM Short Term Goal #1   Over the next 30 days, patient/spouse/family Advait work actively with home health agency  [facility] CM Short Term Goal #1  Start Date  02/27/17  Interventions for Short Term Goal #2   review of home care services and plans with patient/spouse/family  [facility] CM Short Term Goal #2   Over the next 30 days, patient's spouse/caregiver Kennis verbalize understanding of custodial care options available    [facility] CM Care Plan Problem Three     Most Recent Value  Care Plan Problem Three  DME Needs  Role Documenting the Problem Three  Care Management [place] for Problem Three  Active  [facility] CM Short Term Goal #1   Over the next 30 days, patient's family/caregivers Maison verbalize understanding of benefits and availability re: hospital bed for home  [facility] CM Short Term Goal #1 Start Date  02/27/17  Interventions for Short Term Goal #1  inquiry with home care agency re: DME and PCP office re: orders for same      [place] Management  [phone]

## 2017-03-06 ENCOUNTER — Other Ambulatory Visit: Payer: Self-pay

## 2017-03-06 ENCOUNTER — Encounter (HOSPITAL_COMMUNITY): Payer: Self-pay | Admitting: Emergency Medicine

## 2017-03-06 ENCOUNTER — Emergency Department (HOSPITAL_COMMUNITY): Payer: Medicare Other

## 2017-03-06 ENCOUNTER — Other Ambulatory Visit: Payer: Self-pay | Admitting: *Deleted

## 2017-03-06 ENCOUNTER — Emergency Department (HOSPITAL_COMMUNITY)
Admission: EM | Admit: 2017-03-06 | Discharge: 2017-03-06 | Disposition: A | Discharge status: Home / Self Care | Payer: Medicare Other | Attending: Emergency Medicine | Admitting: Emergency Medicine

## 2017-03-06 DIAGNOSIS — I120 Hypertensive chronic kidney disease with stage 5 chronic kidney disease or end stage renal disease: Secondary | ICD-10-CM | POA: Diagnosis not present

## 2017-03-06 DIAGNOSIS — J45998 Other asthma: Secondary | ICD-10-CM | POA: Diagnosis not present

## 2017-03-06 DIAGNOSIS — Z87891 Personal history of nicotine dependence: Secondary | ICD-10-CM | POA: Insufficient documentation

## 2017-03-06 DIAGNOSIS — Z992 Dependence on renal dialysis: Secondary | ICD-10-CM | POA: Diagnosis not present

## 2017-03-06 DIAGNOSIS — I2510 Atherosclerotic heart disease of native coronary artery without angina pectoris: Secondary | ICD-10-CM | POA: Insufficient documentation

## 2017-03-06 DIAGNOSIS — Z951 Presence of aortocoronary bypass graft: Secondary | ICD-10-CM | POA: Diagnosis not present

## 2017-03-06 DIAGNOSIS — Z7982 Long term (current) use of aspirin: Secondary | ICD-10-CM | POA: Diagnosis not present

## 2017-03-06 DIAGNOSIS — R079 Chest pain, unspecified: Secondary | ICD-10-CM

## 2017-03-06 DIAGNOSIS — N186 End stage renal disease: Secondary | ICD-10-CM | POA: Diagnosis not present

## 2017-03-06 DIAGNOSIS — R0789 Other chest pain: Secondary | ICD-10-CM | POA: Insufficient documentation

## 2017-03-06 DIAGNOSIS — Z79899 Other long term (current) drug therapy: Secondary | ICD-10-CM | POA: Insufficient documentation

## 2017-03-06 DIAGNOSIS — Z8673 Personal history of transient ischemic attack (TIA), and cerebral infarction without residual deficits: Secondary | ICD-10-CM | POA: Diagnosis not present

## 2017-03-06 LAB — TROPONIN I: Troponin I: <0.03 ng/mL (ref <0.03)

## 2017-03-06 LAB — BASIC METABOLIC PANEL
ANION GAP: 10 (ref 5–15)
BUN: 38 mg/dL — AB (ref 6–20)
CO2: 29 mmol/L (ref 22–32)
Calcium: 7.6 mg/dL — ABNORMAL LOW (ref 8.9–10.3)
Chloride: 96 mmol/L — ABNORMAL LOW (ref 101–111)
Creatinine, Ser: 3.58 mg/dL — ABNORMAL HIGH (ref 0.61–1.24)
GFR, EST AFRICAN AMERICAN: 17 mL/min — AB (ref >60)
GFR, EST NON AFRICAN AMERICAN: 15 mL/min — AB (ref >60)
Glucose, Bld: 140 mg/dL — ABNORMAL HIGH (ref 65–99)
Potassium: 3.6 mmol/L (ref 3.5–5.1)
SODIUM: 135 mmol/L (ref 135–145)

## 2017-03-06 LAB — CBC
HEMATOCRIT: 28.2 % — AB (ref 39.0–52.0)
Hemoglobin: 9.1 g/dL — ABNORMAL LOW (ref 13.0–17.0)
MCH: 30.5 pg (ref 26.0–34.0)
MCHC: 32.3 g/dL (ref 30.0–36.0)
MCV: 94.6 fL (ref 78.0–100.0)
Platelets: 179 10*3/uL (ref 150–400)
RBC: 2.98 MIL/uL — ABNORMAL LOW (ref 4.22–5.81)
RDW: 13.6 % (ref 11.5–15.5)
WBC: 7.8 10*3/uL (ref 4.0–10.5)

## 2017-03-06 MED ORDER — ACETAMINOPHEN 325 MG PO TABS
650.0000 mg | ORAL_TABLET | Freq: Once | ORAL | Status: AC
  Administered 2017-03-06: 650 mg via ORAL
  Filled 2017-03-06: qty 2

## 2017-03-06 NOTE — ED Notes (Signed)
Attempted to call wife. No answer.

## 2017-03-06 NOTE — Discharge Instructions (Signed)
The test today, were normal.  For persistent pain try taking Tylenol.  If your condition worsens, return here for reevaluation.  Continue with your usual dialysis treatments.

## 2017-03-06 NOTE — ED Provider Notes (Signed)
[facility] EMERGENCY DEPARTMENT Provider Note   CSN: [id number] Arrival date & time: 03/06/17  3818     History   Chief Complaint Chief Complaint  Patient presents with  . Chest Pain    HPI Alan [name] is a 77 y.o. male.  He presents for evaluation of chest pain which he states started this morning.  He was transferred by EMS, and route he was given nitroglycerin, x3 and aspirin 324 mg.  Reportedly this is not change his pain.  He dialyzed yesterday.  He denies shortness of breath, nausea, vomiting, diaphoresis, weakness or dizziness.  He was evaluated in the ED for facial numbness and chest pain, about 3 weeks ago.  There are no other known modifying factors.  HPI  Past Medical History:  Diagnosis Date  . Arthritis   . Asthma   . Clotted renal dialysis arteriovenous graft ([place])   . Coronary artery disease    a. s/p CABG in 1996 with LIMA-LAD and SVG-OM1 b. 01/2017: cath showing occluded SVG-OM1 and atretic LIMA-LAD. Diffuse disease along 1st Mrg and PDA with medical therapy recommended.   . CVA (cerebral infarction)   . DDD (degenerative disc disease), cervical   . DDD (degenerative disc disease), lumbar   . Depression   . Diabetes mellitus   . ESRD (end stage renal disease) on dialysis ([place])   . Gangrene of toe ([place]) Feb. 2015   Right  great    . Gastric ulcer    EGD 2014  . Gout   . Hypercholesteremia   . Hypertension   . Stroke ([place])   . 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 Thrombosis of renal dialysis arteriovenous graft [facility])     Patient Active Problem List   Diagnosis Date Noted  . Left sided numbness 02/10/2017  . Hypertensive urgency 02/10/2017  . Hypokalemia 02/10/2017  . Chest pain 06/15/2016  . Renal dialysis device, implant, or graft complication 29/93/7169  . Septic shock ([place]) 08/28/2015  . 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 Lower urinary tract infectious disease   . Essential hypertension 08/13/2015  . Nausea and vomiting  08/13/2015  . ESRD (end stage renal disease) ([place]) 04/12/2015  . Gastroenteritis 04/12/2015  . S/P bilateral BKA (below knee amputation) ([place]) 04/12/2015  . HLD (hyperlipidemia) 04/12/2015  . Acute chest pain 11/16/2014  . Atherosclerosis of native arteries of the extremities with gangrene ([place]) 04/20/2013  . Sepsis ([place]) 04/13/2013  . Gram-negative bacteremia 04/12/2013  . ESRD on dialysis ([place]) 04/09/2013  . 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 Protein-calorie malnutrition, severe ([place]) 04/09/2013  . Gangrene of toe ([place]) 04/08/2013  . Toe gangrene ([place]) 04/08/2013  . Nausea with vomiting 05/22/2012  . S/P BKA (below knee amputation) unilateral, left 05/20/2012  . Musculoskeletal chest pain 05/20/2012  . Acute renal failure ([place]) 05/19/2012  . CAD (coronary artery disease) 05/19/2012  . DM type 2 causing vascular disease ([place]) 05/19/2012  . Hyperkalemia 05/19/2012    Past Surgical History:  Procedure Laterality Date  . A/V FISTULAGRAM Left 05/28/2016   Procedure: A/V Fistulagram;  Surgeon: Katha Cabal, MD;  Location: [place] CV [facility];  Service: Cardiovascular;  Laterality: Left;  . A/V FISTULAGRAM Left 10/28/2016   Procedure: A/V Fistulagram;  Surgeon: Algernon Huxley, MD;  Location: [place] CV LAB;  Service: Cardiovascular;  Laterality: Left;  . A/V FISTULAGRAM Left 03/03/2017   Procedure: A/V FISTULAGRAM;  Surgeon: Algernon Huxley, MD;  Location: [place] CV LAB;  Service: Cardiovascular;  Laterality: Left;  .  A/V SHUNT INTERVENTION N/A 05/28/2016   Procedure: A/V Shunt Intervention;  Surgeon: Katha Cabal, MD;  Location: [place] CV LAB;  Service: Cardiovascular;  Laterality: N/A;  . A/V SHUNT INTERVENTION N/A 03/03/2017   Procedure: A/V SHUNT INTERVENTION;  Surgeon: Algernon Huxley, MD;  Location: [place] CV LAB;  Service: Cardiovascular;  Laterality: N/A;  . AMPUTATION Right 04/10/2013   Procedure: Right Below Knee Amputation;  Surgeon: Newt Minion, MD;  Location: [place];  Service: Orthopedics;  Laterality: Right;  Right Below Knee Amputation  . APPENDECTOMY    . BELOW KNEE LEG AMPUTATION Bilateral   . CHOLECYSTECTOMY    . CORONARY ARTERY BYPASS GRAFT  1996   [place]  . ESOPHAGOGASTRODUODENOSCOPY Left 05/24/2012   WUJ:[id number] dilated baggy but otherwise a normal/ Small hiatal hernia. Gastric ulcer -S/P biopsy  . LEFT HEART CATH AND CORS/GRAFTS ANGIOGRAPHY N/A 02/12/2017   Procedure: LEFT HEART CATH AND CORS/GRAFTS ANGIOGRAPHY;  Surgeon: Lorretta Harp, MD;  Location: [place] [facility];  Service: Cardiovascular;  Laterality: N/A;  . PERIPHERAL VASCULAR CATHETERIZATION N/A 08/09/2014   Procedure: A/V Shuntogram/Fistulagram;  Surgeon: Katha Cabal, MD;  Location: [place] [facility];  Service: Cardiovascular;  Laterality: N/A;  . PERIPHERAL VASCULAR CATHETERIZATION Left 08/09/2014   Procedure: A/V Shunt Intervention;  Surgeon: Katha Cabal, MD;  Location: [place] CV LAB;  Service: Cardiovascular;  Laterality: Left;       Home Medications    Prior to Admission medications   Medication Sig Start Date End Date Taking? Authorizing Provider  ACCU-CHEK AVIVA PLUS test strip  03/23/16  Yes [provider]  acetaminophen (TYLENOL) 500 MG tablet Take 1,000 mg by mouth every 8 (eight) hours as needed for mild pain or moderate pain.    Yes [provider]  Amino Acid Infusion (PROSOL) 20 % SOLN Inject 1 vial into the vein once a week. 02/28/17  Yes [provider]  amLODipine (NORVASC) 5 MG tablet Take 5 mg by mouth daily.   Yes [provider]  Ascorbic Acid (VITAMIN C) 1000 MG tablet Take 1,000 mg by mouth daily.   Yes [provider]  aspirin EC 81 MG tablet Take 81 mg by mouth daily.    Yes [provider]  B Complex-C-Folic Acid (NEPHRO-VITE PO) Take 1 tablet by mouth at bedtime.    Yes [provider]  budesonide-formoterol (SYMBICORT) 80-4.5 MCG/ACT inhaler  Inhale 2 puffs into the lungs 2 (two) times daily as needed (for wheezing/shortness of breath.).   Yes [provider]  cinacalcet (SENSIPAR) 30 MG tablet Take 30 mg by mouth at bedtime.    Yes [provider]  Coenzyme Q10 (COQ10) 100 MG CAPS Take 100 mg by mouth at bedtime.   Yes [provider]  diphenhydrAMINE (BENADRYL) 25 mg capsule Take 25 mg by mouth at bedtime.    Yes [provider]  furosemide (LASIX) 20 MG tablet Take 20 mg by mouth daily.   Yes [provider]  isosorbide mononitrate (IMDUR) 30 MG 24 hr tablet Take 0.5 tablets (15 mg total) by mouth daily. 02/14/17  Yes Lavina Hamman, MD  LANTUS SOLOSTAR 100 UNIT/ML Solostar Pen Inject 9 Units into the skin at bedtime.  12/21/13  Yes [provider]  Liniments (SALONPAS PAIN RELIEF PATCH EX) Place 1 patch onto the skin daily as needed (for pain relief.).    Yes [provider]  lisinopril (PRINIVIL,ZESTRIL) 5 MG tablet Take 5 mg  by mouth daily.   Yes [provider]  metoprolol succinate (TOPROL XL) 25 MG 24 hr tablet Take 0.5 tablets (12.5 mg total) by mouth daily. Patient taking differently: Take 12.5 mg by mouth every Monday, Wednesday, and Friday. In the afternoon after dialysis. 02/13/17 02/13/18 Yes Lavina Hamman, MD  nitroGLYCERIN (NITROSTAT) 0.4 MG SL tablet Place 1 tablet (0.4 mg total) under the tongue every 5 (five) minutes as needed for chest pain. 11/18/14  Yes Samuella Cota, MD  omega-3 acid ethyl esters (LOVAZA) 1 g capsule Take 1 g by mouth daily.   Yes [provider]  ondansetron (ZOFRAN-ODT) 4 MG disintegrating tablet Take 4 mg by mouth every 8 (eight) hours as needed for nausea or vomiting.   Yes [provider]    Family History Family History  Problem Relation Age of Onset  . Cancer Mother   . Colon cancer Neg Hx     Social History Social History   Tobacco Use  . Smoking status: Former Smoker    Last attempt  to quit: 08/08/2004    Years since quitting: 12.5  . Smokeless tobacco: Never Used  Substance Use Topics  . Alcohol use: No  . Drug use: No     Allergies   Codeine and Yellow jacket venom [bee venom]   Review of Systems Review of Systems  All other systems reviewed and are negative.    Physical Exam Updated Vital Signs BP (!) 146/66   Pulse 71   Temp (!) 97.5 F (36.4 C) (Oral)   Resp (!) 21   Ht 5' (1.524 m)   Wt 75 kg (165 lb 5.5 oz)   SpO2 99%   BMI 32.29 kg/m   Physical Exam  Constitutional: He is oriented to person, place, and time. He appears well-developed and well-nourished. He does not appear ill.  HENT:  Head: Normocephalic and atraumatic.  Right Ear: External ear normal.  Left Ear: External ear normal.  Eyes: Conjunctivae and EOM are normal. Pupils are equal, round, and reactive to light.  Neck: Normal range of motion and phonation normal. Neck supple.  Cardiovascular: Normal rate, regular rhythm and normal heart sounds.  Vascular fistula left upper arm with normal pulse rotation.  Pulmonary/Chest: Effort normal and breath sounds normal. No accessory muscle usage. No respiratory distress. He exhibits no bony tenderness.  Abdominal: Soft. There is no tenderness.  Musculoskeletal: Normal range of motion.       Right lower leg: He exhibits no edema.       Left lower leg: He exhibits no edema.  Bilateral BKA.  Neurological: He is alert and oriented to person, place, and time. No cranial nerve deficit or sensory deficit. He exhibits normal muscle tone. Coordination normal.  Skin: Skin is warm, dry and intact.  Psychiatric: He has a normal mood and affect. His behavior is normal. Judgment and thought content normal.  Nursing note and vitals reviewed.    ED Treatments / Results  Labs (all labs ordered are listed, but only abnormal results are displayed) Labs Reviewed  BASIC METABOLIC PANEL - Abnormal; Notable for the following components:      Result  Value   Chloride 96 (*)    Glucose, Bld 140 (*)    BUN 38 (*)    Creatinine, Ser 3.58 (*)    Calcium 7.6 (*)    GFR calc non Af Amer 15 (*)    GFR calc Af Amer 17 (*)    All other  components within normal limits  CBC - Abnormal; Notable for the following components:   RBC 2.98 (*)    Hemoglobin 9.1 (*)    HCT 28.2 (*)    All other components within normal limits  TROPONIN I    EKG  EKG Interpretation  Date/Time:  Thursday March 06 2017 10:04:08 EST Ventricular Rate:  86 PR Interval:    QRS Duration: 90 QT Interval:  402 QTC Calculation: 481 R Axis:   0 Text Interpretation:  Sinus rhythm Anteroseptal infarct, old since last tracing no significant change Confirmed by Daleen Bo [phone]) on 03/06/2017 11:08:12 AM       Radiology Dg Chest 2 View  Result Date: 03/06/2017 CLINICAL DATA:  Chest pain EXAM: CHEST  2 VIEW COMPARISON:  February 10, 2017 FINDINGS: There is scarring in the right mid lung as well as in the left mid lower lung zones. There is no appreciable edema or consolidation. The heart is upper normal in size with pulmonary vascularity within normal limits. No adenopathy. Patient is status post coronary artery bypass grafting. No adenopathy. There is thoracolumbar levoscoliosis. There is left carotid artery calcification. There is extensive arthropathy in both shoulders with superior migration of each humeral head. IMPRESSION: Areas of scarring bilaterally, more on the left than on the right, stable. No edema or consolidation. Stable cardiac silhouette. There is left carotid artery calcification. No evident adenopathy. Chronic rotator cuff tears bilaterally, characterized by superior migration of each humeral head. Electronically Signed   By: Lowella Grip III M.D.   On: 03/06/2017 11:49    Procedures Procedures (including critical care time)  Medications Ordered in ED Medications  acetaminophen (TYLENOL) tablet 650 mg (650 mg Oral Given 03/06/17 1223)      Initial Impression / Assessment and Plan / ED Course  I have reviewed the triage vital signs and the nursing notes.  Pertinent labs & imaging results that were available during my care of the patient were reviewed by me and considered in my medical decision making (see chart for details).      Patient Vitals for the past 24 hrs:  BP Temp Temp src Pulse Resp SpO2 Height Weight  03/06/17 1230 (!) 146/66 - - - (!) 21 - - -  03/06/17 1202 (!) 152/65 - - 71 18 99 % - -  03/06/17 1200 (!) 161/73 - - - 19 - - -  03/06/17 1130 (!) 152/65 - - - (!) 21 - - -  03/06/17 1100 139/62 - - 71 (!) 21 95 % - -  03/06/17 1044 130/63 - - 72 18 94 % - -  03/06/17 1006 (!) 159/79 (!) 97.5 F (36.4 C) Oral 85 19 100 % - -  03/06/17 1003 - - - - - - 5' (1.524 m) 75 kg (165 lb 5.5 oz)    1:08 PM Reevaluation with update and discussion. After initial assessment and treatment, an updated evaluation reveals he is resting comfortably and has no further complaints.  Repeat vital signs are reassuring.Daleen Bo      Final Clinical Impressions(s) / ED Diagnoses   Final diagnoses:  Nonspecific chest pain   Nonspecific pain, ED evaluation negative for acute cardiopulmonary abnormalities.  Stable end-stage renal disease, currently under treatment with hemodialysis.  No indication for hospitalization or further intervention at this time.  Nursing Notes Reviewed/ Care Coordinated Applicable Imaging Reviewed Interpretation of Laboratory Data incorporated into ED treatment  The patient appears reasonably screened and/or stabilized for discharge and I  doubt any other medical condition or other [facility] requiring further screening, evaluation, or treatment in the ED at this time prior to discharge.  Plan: Home Medications-continue usual medications, APAP for pain; Home Treatments-rest, fluids; return here if the recommended treatment, does not improve the symptoms; Recommended follow up-PCP, as needed.   Dialyze as usual   ED Discharge Orders    None       Daleen Bo, MD 03/06/17 1309

## 2017-03-06 NOTE — ED Notes (Addendum)
Spoke with daughter gave number to call son [phone]. Family is on the way

## 2017-03-06 NOTE — ED Triage Notes (Signed)
Pt given 324mg  asa and 3 sl ntg in route without releif

## 2017-03-06 NOTE — ED Triage Notes (Signed)
Pt started having chest pain since around 0530 this morning

## 2017-03-06 NOTE — ED Notes (Signed)
Pt returned from X-ray.  

## 2017-03-06 NOTE — Patient Outreach (Addendum)
[facility]) Care Management  03/06/2017  Alan [name] 09/14/1940 [id number]  Noted Alan [name]'s ED visit with complaint of chest pain. I reached out to his home and spoke with his wife who told me that Alan [name] had awakened early this morning with complaints of chest pain. She is unable to come to the hospital as she is at home recovering from her own recent hospitalization for treatment of SAH/ICH.   Caregiver Needs - I also reached out to some local care agencies who provide private duty custodial care services in the area to inquire about cost of services and availability for Alan [name]'s custodial care needs and shared the information with Alan [name].   Alan [name] [phone]) number no longer in service  [employer] [phone]) mailbox full  [employer]  [phone]) [facility]helia [employer] Pay - $21/hr (2 hour minimum) Referral - RN to set up home visit  Davonna Belling [phone]) number no longer in service  [employer] [phone]) wrong number listed on web site   DME Needs - Alan [name] is inquiring about the appropriateness of a hospital bed for Alan [name]. Given her physical decline and recovery needs after her recent hospitalization, she feels unsure and unsafe about getting Alan [name] in and out of bed each evening. I spoke with Alan [name] office staff to notify them that I would request discussion around this issue when Alan [name] comes in for his office visit later in the month.   Plan: I Alan [name] follow Alan [name]'s progress closely and maintain contact with Alan [name] with plans for transition of care services for Alan [name] should he be admitted to the hospital or a home visit next week.   THN CM Care Plan Problem One     Most Recent Value  Care Plan Problem One  Medication Management Concerns  Role Documenting the Problem One  Care Management Coordinator  Care Plan for Problem One  Active  THN Long Term Goal   Over the next 60 days, patient's spouse  and adult child Alan [name] demonstrate ability to correctly administer prescribed medications  THN Long Term Goal Start Date  02/27/17  Interventions for Problem One Long Term Goal  reviewed patient medications self administration progress  THN CM Short Term Goal #1   Over the next 30 days, patient's spouse and daughter Alan [name] correctly refill pill box as instructed  THN CM Short Term Goal #1 Start Date  02/27/17  Interventions for Short Term Goal #1  discussed progress with med administration and pill box method    [facility] CM Care Plan Problem Two     Most Recent Value  Care Plan Problem Two  Home Care/Custodial Care Needs  Role Documenting the Problem Two  Care Management [place] for Problem Two  Active  Interventions for Problem Two Long Term Goal   collaboration with community agencies/resources  THN Long Term Goal  Over the next 31 days, patients' spouse/family Alan [name] verablize understanding of home care/custodial care resources availabe to patient  [facility] Long Term Goal Start Date  02/27/17  [facility] CM Short Term Goal #1   Over the next 30 days, patient/spouse/family Alan [name] work actively with home health agency  [facility] CM Short Term Goal #1 Start Date  02/27/17  Interventions for Short Term Goal #2   discussed home care progress,  talked with nursing supervisor to notify that patient is currently in ED  [facility] CM Short Term Goal #2   Over the next 30 days, patient's spouse/caregiver  Alan [name] verbalize understanding of custodial care options available  THN CM Short Term Goal #2 Start Date  02/27/17  Interventions for Short Term Goal #2  reviewed findings from investigation about community agencies and availability    [facility] CM Care Plan Problem Three     Most Recent Value  Care Plan Problem Three  DME Needs  Role Documenting the Problem Three  Care Management [place] for Problem Three  Active  THN CM Short Term Goal #1   Over the next 30 days, patient's family/caregivers Alan [name] verbalize  understanding of benefits and availability re: hospital bed for home  [facility] CM Short Term Goal #1 Start Date  02/27/17  Interventions for Short Term Goal #1  outreach and follow up with PCP office re: order for hospital bed     [place] Management  [phone]

## 2017-03-11 ENCOUNTER — Other Ambulatory Visit: Payer: Self-pay | Admitting: *Deleted

## 2017-03-11 NOTE — Patient Outreach (Signed)
[facility]) Care Management  03/11/2017  Garison Mccaster 1940-12-31 [id number]  Flora Belote is an 77 y.o. 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   Mr. Jarriel Papillion is a 77 year old gentleman who lives in [place], [place] with his wife and adult son. Mr. Markell recently was discharged from the hospital. His wife who is a retired [employer] manager and who recently discharged from the hospital after having suffered a SAH and ICH, is his primary caregiver. Since her parents recent illness, Ms. Venetia Constable, Mr. & Mrs. Nellums's daughter, has been primary caregiver for both of her parents.   Home Care Needs - Mr. Overholser is being followed closely in the home by nursing and physical therapy through [facility].   Mrs. Capote and daughter Venetia Constable are both concerned about Mr. Pizzini's care needs being met long term as Mrs. Ohaver now has her own health care and home needs after her recent [place]. Ms. Tamala Julian has been providing care for Mr. & Mrs. Keeling.   According to Mrs. Ruegg and Ms. Tamala Julian, Mr. Leitzel does not qualify for Medicaid (citing inability to meet income requirements). I inquired with Mrs. Uselton about whether they might be able to pay out of pocket for a few hours of care each day such as morning and bedtime custodial care. Mrs. Leather feels this might be a strain but would be interested in at least inquiring about resources in the county.   I reached out to some local care agencies who provide private duty custodial care services in the area to inquire about cost of services and availability. Mrs. Minus was familiar with an agency called [facility]  [phone]; contact Jeani Hawking). Their private pay rate is $21/hr (2 hour minimum). Mrs. Marley says she does not believe they Jlyn be able to pay  privately for care. If Mrs. Artiga does change her mind, a call to the agency Shane initate a call from intake nurse who Gailen set up a home visit to assess needs and establish care.   DME Needs - Mrs. Copado is spoke with Mr. Trampe's PCP re: a hospital bed and the provider office is going to work with the home care agency re: DME needs.   Chronic Health Conditions - as noted above, Mr. Proto has ESRD. He goes to [facility] 3 times weekly and is transported by the county transportation service.   Ms. Mault checks Mr. Riffel's cbg's and bp but not routinely. We have discussed the importance of routine monitoring and recording and I provided a documentation tool in addition to hands on instruction about documentation.   Medication Management - Mrs. Corvino administers Mr. Kreis's medications. I reviewed his medications in detail with Mrs. Grieshop and Ms. Smith.   In particular we have reviewed the instructions re: Metoprolol to be given M/W/F after dilaysis and Imdur to be given on T/Th/Sat.   Plan: Mrs. Stumpe and her daughter Keonte continue to provide assistance and hands on care for Mr. Glorioso, Romelo continue to work closely with Mr. Auker's home health team, and Earlie call for new needs or concerns.   Mr. & Mrs. Mott agreed to transfer primary care manager oversight to my colleague Jackelyn Poling, RN, BSN as I transition to a new role.   [facility] CM Care Plan Problem One     Most Recent Value  Care Plan Problem One  Medication Management Concerns  Role Documenting the Problem One  Care Management Coordinator  Care Plan for Problem One  Active  THN Long Term Goal   Over the next 60 days, patient's spouse and adult child Ewen demonstrate ability to correctly administer prescribed medications  THN Long Term Goal Start Date  02/27/17  Interventions for Problem One Long Term Goal  medications reviewed,  questions addressed  THN CM Short Term Goal #1   Over the next 30 days, patient's spouse and  daughter Jerrian correctly refill pill box as instructed  THN CM Short Term Goal #1 Start Date  02/27/17  Interventions for Short Term Goal #1  addressed patient's spouse questions re: medicaton management    [facility] CM Care Plan Problem Two     Most Recent Value  Care Plan Problem Two  Home Care/Custodial Care Needs  Role Documenting the Problem Two  Care Management [place] for Problem Two  Active  Interventions for Problem Two Long Term Goal   reviewed home care resources in the community  [facility] Long Term Goal  Over the next 31 days, patients' spouse/family Herschel verablize understanding of home care/custodial care resources availabe to patient  [facility] Long Term Goal Start Date  02/27/17  [facility] CM Short Term Goal #1   Over the next 30 days, patient/spouse/family Dreden work actively with home health agency  [facility] CM Short Term Goal #1 Start Date  02/27/17  Interventions for Short Term Goal #2   discussed home care progress,  talked with nursing supervisor to notify that patient is currently in ED  [facility] CM Short Term Goal #2   Over the next 30 days, patient's spouse/caregiver Jaquarious verbalize understanding of custodial care options available  THN CM Short Term Goal #2 Start Date  02/27/17  Interventions for Short Term Goal #2  ensured that patient's spouse has needed community resource information and contacts    [facility] CM Care Plan Problem Three     Most Recent Value  Care Plan Problem Three  DME Needs  Role Documenting the Problem [place] for Problem Three  Active  THN CM Short Term Goal #1   Over the next 30 days, patient's family/caregivers Mikhail verbalize understanding of benefits and availability re: hospital bed for home  [facility] CM Short Term Goal #1 Start Date  02/27/17  Interventions for Short Term Goal #1  outreach and follow up with PCP office re: order for hospital bed      [place] Management  [phone]

## 2017-04-15 ENCOUNTER — Telehealth: Payer: Self-pay | Admitting: *Deleted

## 2017-04-15 NOTE — Patient Outreach (Signed)
[facility]) Care Management  04/15/2017  Alan [name] [birth year] [id number]   Care coordination  Transferred case from Orlie Dakin, RN CCM  Call attempt [employer] automated response reports pt unavailable per the listed home and mobile number for [name]   Plans Attempt a call this week  call attempt to pat no answer Call attempt to daughter listed on [facility] consent THN CM left a HIPPA compliant voice message for  including THN CM mobile number for a return call.     [name] L. Lavina Hamman, RN, BSN, [place] Coordinator [phone] week day mobile

## 2017-04-17 ENCOUNTER — Other Ambulatory Visit (INDEPENDENT_AMBULATORY_CARE_PROVIDER_SITE_OTHER): Payer: Self-pay | Admitting: Vascular Surgery

## 2017-04-17 ENCOUNTER — Encounter (INDEPENDENT_AMBULATORY_CARE_PROVIDER_SITE_OTHER): Payer: Medicare Other

## 2017-04-17 ENCOUNTER — Ambulatory Visit (INDEPENDENT_AMBULATORY_CARE_PROVIDER_SITE_OTHER): Payer: Medicare Other | Admitting: Vascular Surgery

## 2017-04-17 DIAGNOSIS — N186 End stage renal disease: Secondary | ICD-10-CM

## 2017-04-17 DIAGNOSIS — T829XXD Unspecified complication of cardiac and vascular prosthetic device, implant and graft, subsequent encounter: Secondary | ICD-10-CM

## 2017-05-01 ENCOUNTER — Other Ambulatory Visit: Payer: Self-pay | Admitting: *Deleted

## 2017-05-12 ENCOUNTER — Encounter (HOSPITAL_COMMUNITY): Payer: Self-pay | Admitting: Cardiology

## 2017-05-12 ENCOUNTER — Emergency Department (HOSPITAL_COMMUNITY)
Admission: EM | Admit: 2017-05-12 | Discharge: 2017-05-12 | Disposition: A | Discharge status: Home / Self Care | Payer: Medicare Other | Attending: Emergency Medicine | Admitting: Emergency Medicine

## 2017-05-12 ENCOUNTER — Emergency Department (HOSPITAL_COMMUNITY): Payer: Medicare Other

## 2017-05-12 DIAGNOSIS — R079 Chest pain, unspecified: Secondary | ICD-10-CM

## 2017-05-12 DIAGNOSIS — Z992 Dependence on renal dialysis: Secondary | ICD-10-CM | POA: Insufficient documentation

## 2017-05-12 DIAGNOSIS — Z87891 Personal history of nicotine dependence: Secondary | ICD-10-CM | POA: Insufficient documentation

## 2017-05-12 DIAGNOSIS — Z7982 Long term (current) use of aspirin: Secondary | ICD-10-CM | POA: Diagnosis not present

## 2017-05-12 DIAGNOSIS — E7800 Pure hypercholesterolemia, unspecified: Secondary | ICD-10-CM | POA: Diagnosis not present

## 2017-05-12 DIAGNOSIS — N186 End stage renal disease: Secondary | ICD-10-CM | POA: Insufficient documentation

## 2017-05-12 DIAGNOSIS — Z794 Long term (current) use of insulin: Secondary | ICD-10-CM | POA: Insufficient documentation

## 2017-05-12 DIAGNOSIS — E119 Type 2 diabetes mellitus without complications: Secondary | ICD-10-CM | POA: Diagnosis not present

## 2017-05-12 DIAGNOSIS — R0789 Other chest pain: Secondary | ICD-10-CM | POA: Insufficient documentation

## 2017-05-12 DIAGNOSIS — J45909 Unspecified asthma, uncomplicated: Secondary | ICD-10-CM | POA: Insufficient documentation

## 2017-05-12 DIAGNOSIS — I120 Hypertensive chronic kidney disease with stage 5 chronic kidney disease or end stage renal disease: Secondary | ICD-10-CM | POA: Insufficient documentation

## 2017-05-12 DIAGNOSIS — Z79899 Other long term (current) drug therapy: Secondary | ICD-10-CM | POA: Insufficient documentation

## 2017-05-12 DIAGNOSIS — Z8673 Personal history of transient ischemic attack (TIA), and cerebral infarction without residual deficits: Secondary | ICD-10-CM | POA: Diagnosis not present

## 2017-05-12 DIAGNOSIS — I2510 Atherosclerotic heart disease of native coronary artery without angina pectoris: Secondary | ICD-10-CM | POA: Diagnosis not present

## 2017-05-12 LAB — BASIC METABOLIC PANEL
ANION GAP: 11 (ref 5–15)
BUN: 35 mg/dL — AB (ref 6–20)
CHLORIDE: 96 mmol/L — AB (ref 101–111)
CO2: 27 mmol/L (ref 22–32)
Calcium: 7.7 mg/dL — ABNORMAL LOW (ref 8.9–10.3)
Creatinine, Ser: 3.13 mg/dL — ABNORMAL HIGH (ref 0.61–1.24)
GFR calc Af Amer: 20 mL/min — ABNORMAL LOW (ref >60)
GFR, EST NON AFRICAN AMERICAN: 18 mL/min — AB (ref >60)
GLUCOSE: 157 mg/dL — AB (ref 65–99)
Potassium: 3.2 mmol/L — ABNORMAL LOW (ref 3.5–5.1)
Sodium: 134 mmol/L — ABNORMAL LOW (ref 135–145)

## 2017-05-12 LAB — CBC WITH DIFFERENTIAL/PLATELET
BASOS ABS: 0 10*3/uL (ref 0.0–0.1)
Basophils Relative: 0 %
EOS PCT: 2 %
Eosinophils Absolute: 0.2 10*3/uL (ref 0.0–0.7)
HEMATOCRIT: 35.1 % — AB (ref 39.0–52.0)
HEMOGLOBIN: 11.5 g/dL — AB (ref 13.0–17.0)
LYMPHS ABS: 2 10*3/uL (ref 0.7–4.0)
Lymphocytes Relative: 30 %
MCH: 31.2 pg (ref 26.0–34.0)
MCHC: 32.8 g/dL (ref 30.0–36.0)
MCV: 95.1 fL (ref 78.0–100.0)
Monocytes Absolute: 0.4 10*3/uL (ref 0.1–1.0)
Monocytes Relative: 6 %
NEUTROS ABS: 4.1 10*3/uL (ref 1.7–7.7)
NEUTROS PCT: 62 %
PLATELETS: 178 10*3/uL (ref 150–400)
RBC: 3.69 MIL/uL — ABNORMAL LOW (ref 4.22–5.81)
RDW: 13.4 % (ref 11.5–15.5)
WBC: 6.7 10*3/uL (ref 4.0–10.5)

## 2017-05-12 LAB — TROPONIN I: Troponin I: <0.03 ng/mL (ref <0.03)

## 2017-05-12 MED ORDER — OXYCODONE-ACETAMINOPHEN 5-325 MG PO TABS
1.0000 | ORAL_TABLET | Freq: Once | ORAL | Status: AC
  Administered 2017-05-12: 1 via ORAL
  Filled 2017-05-12: qty 1

## 2017-05-12 MED ORDER — OXYCODONE-ACETAMINOPHEN 5-325 MG PO TABS: 1.0000 | ORAL_TABLET | Freq: Four times a day (QID) | ORAL | 0 refills | Status: DC | PRN

## 2017-05-12 NOTE — ED Notes (Signed)
Attempt to call patient home x's 2 with no answer.

## 2017-05-12 NOTE — ED Triage Notes (Signed)
Pt was at dialysis half way thru treatment and developed chest pain.  Pt hypertensive upon EMS arrival.  EMS gave 4 baby aspirin and one ntg.  Dialysis center gave clonidine and ntg sl.  Pain has improved.  Rates a 2/10 now.

## 2017-05-12 NOTE — Discharge Instructions (Addendum)
Follow up with your md later this week.

## 2017-05-12 NOTE — ED Notes (Signed)
Daughter called she Renn be on her way to pick up patient to take home.

## 2017-05-13 NOTE — ED Provider Notes (Signed)
[facility] EMERGENCY DEPARTMENT Provider Note   CSN: [id number] Arrival date & time: 05/12/17  1333     History   Chief Complaint No chief complaint on file.   HPI Alan [name] is a 77 y.o. male.  Patient was getting dialysis today and he started having chest pain.  He had a dialysis about two thirds normal time no shortness of breath  The history is provided by the patient. No language interpreter was used.  Chest Pain   This is a new problem. The current episode started 12 to 24 hours ago. The problem occurs constantly. The problem has not changed since onset.The pain is associated with exertion. The pain is present in the substernal region. The pain is at a severity of 5/10. The pain is moderate. The quality of the pain is described as burning. The pain does not radiate. Pertinent negatives include no abdominal pain, no back pain, no cough and no headaches. He has tried nothing for the symptoms.  Pertinent negatives for past medical history include no seizures.    Past Medical History:  Diagnosis Date  . Arthritis   . Asthma   . Clotted renal dialysis arteriovenous graft ([place])   . Coronary artery disease    a. s/p CABG in 1996 with LIMA-LAD and SVG-OM1 b. 01/2017: cath showing occluded SVG-OM1 and atretic LIMA-LAD. Diffuse disease along 1st Mrg and PDA with medical therapy recommended.   . CVA (cerebral infarction)   . DDD (degenerative disc disease), cervical   . DDD (degenerative disc disease), lumbar   . Depression   . Diabetes mellitus   . ESRD (end stage renal disease) on dialysis ([place])   . Gangrene of toe ([place]) Feb. 2015   Right  great    . Gastric ulcer    EGD 2014  . Gout   . Hypercholesteremia   . Hypertension   . Stroke ([place])   . 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 Thrombosis of renal dialysis arteriovenous graft [facility])     Patient Active Problem List   Diagnosis Date Noted  . Left sided numbness 02/10/2017  . Hypertensive urgency 02/10/2017  .  Hypokalemia 02/10/2017  . Chest pain 06/15/2016  . Renal dialysis device, implant, or graft complication 00/17/4944  . Septic shock ([place]) 08/28/2015  . 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 Lower urinary tract infectious disease   . Essential hypertension 08/13/2015  . Nausea and vomiting 08/13/2015  . ESRD (end stage renal disease) ([place]) 04/12/2015  . Gastroenteritis 04/12/2015  . S/P bilateral BKA (below knee amputation) ([place]) 04/12/2015  . HLD (hyperlipidemia) 04/12/2015  . Acute chest pain 11/16/2014  . Atherosclerosis of native arteries of the extremities with gangrene ([place]) 04/20/2013  . Sepsis ([place]) 04/13/2013  . Gram-negative bacteremia 04/12/2013  . ESRD on dialysis ([place]) 04/09/2013  . 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 Protein-calorie malnutrition, severe ([employer]) 04/09/2013  . Gangrene of toe ([place]) 04/08/2013  . Toe gangrene ([place]) 04/08/2013  . Nausea with vomiting 05/22/2012  . S/P BKA (below knee amputation) unilateral, left 05/20/2012  . Musculoskeletal chest pain 05/20/2012  . Acute renal failure ([place]) 05/19/2012  . CAD (coronary artery disease) 05/19/2012  . DM type 2 causing vascular disease ([place]) 05/19/2012  . Hyperkalemia 05/19/2012    Past Surgical History:  Procedure Laterality Date  . A/V FISTULAGRAM Left 05/28/2016   Procedure: A/V Fistulagram;  Surgeon: Katha Cabal, MD;  Location: [place] [facility];  Service: Cardiovascular;  Laterality: Left;  . A/V FISTULAGRAM Left 10/28/2016  Procedure: A/V Fistulagram;  Surgeon: Algernon Huxley, MD;  Location: [place] [facility];  Service: Cardiovascular;  Laterality: Left;  . A/V FISTULAGRAM Left 03/03/2017   Procedure: A/V FISTULAGRAM;  Surgeon: Algernon Huxley, MD;  Location: [place] [facility];  Service: Cardiovascular;  Laterality: Left;  . A/V SHUNT INTERVENTION N/A 05/28/2016   Procedure: A/V Shunt Intervention;  Surgeon: Katha Cabal, MD;  Location: [place] [facility];  Service:  Cardiovascular;  Laterality: N/A;  . A/V SHUNT INTERVENTION N/A 03/03/2017   Procedure: A/V SHUNT INTERVENTION;  Surgeon: Algernon Huxley, MD;  Location: [place] [facility];  Service: Cardiovascular;  Laterality: N/A;  . AMPUTATION Right 04/10/2013   Procedure: Right Below Knee Amputation;  Surgeon: Newt Minion, MD;  Location: [place];  Service: Orthopedics;  Laterality: Right;  Right Below Knee Amputation  . APPENDECTOMY    . BELOW KNEE LEG AMPUTATION Bilateral   . CHOLECYSTECTOMY    . CORONARY ARTERY BYPASS GRAFT  1996   [place]  . ESOPHAGOGASTRODUODENOSCOPY Left 05/24/2012   XIP:[id number] dilated baggy but otherwise a normal/ Small hiatal hernia. Gastric ulcer -S/P biopsy  . LEFT HEART CATH AND CORS/GRAFTS ANGIOGRAPHY N/A 02/12/2017   Procedure: LEFT HEART CATH AND CORS/GRAFTS ANGIOGRAPHY;  Surgeon: Lorretta Harp, MD;  Location: [place] [facility];  Service: Cardiovascular;  Laterality: N/A;  . PERIPHERAL VASCULAR CATHETERIZATION N/A 08/09/2014   Procedure: A/V Shuntogram/Fistulagram;  Surgeon: Katha Cabal, MD;  Location: [place] [facility];  Service: Cardiovascular;  Laterality: N/A;  . PERIPHERAL VASCULAR CATHETERIZATION Left 08/09/2014   Procedure: A/V Shunt Intervention;  Surgeon: Katha Cabal, MD;  Location: [place] [facility];  Service: Cardiovascular;  Laterality: Left;        Home Medications    Prior to Admission medications   Medication Sig Start Date End Date Taking? Authorizing Provider  ACCU-CHEK AVIVA PLUS test strip  03/23/16   [provider]  acetaminophen (TYLENOL) 500 MG tablet Take 1,000 mg by mouth every 8 (eight) hours as needed for mild pain or moderate pain.     [provider]  Amino Acid Infusion (PROSOL) 20 % SOLN Inject 1 vial into the vein once a week. 02/28/17   [provider]  amLODipine (NORVASC) 5 MG tablet Take 5 mg by mouth daily.    [provider]  Ascorbic Acid (VITAMIN C) 1000 MG tablet Take 1,000 mg by  mouth daily.    [provider]  aspirin EC 81 MG tablet Take 81 mg by mouth daily.     [provider]  B Complex-C-Folic Acid (NEPHRO-VITE PO) Take 1 tablet by mouth at bedtime.     [provider]  budesonide-formoterol (SYMBICORT) 80-4.5 MCG/ACT inhaler Inhale 2 puffs into the lungs 2 (two) times daily as needed (for wheezing/shortness of breath.).    [provider]  cinacalcet (SENSIPAR) 30 MG tablet Take 30 mg by mouth at bedtime.     [provider]  Coenzyme Q10 (COQ10) 100 MG CAPS Take 100 mg by mouth at bedtime.    [provider]  diphenhydrAMINE (BENADRYL) 25 mg capsule Take 25 mg by mouth at bedtime.     [provider]  furosemide (LASIX) 20 MG tablet Take 20 mg by mouth daily.    [provider]  isosorbide mononitrate (IMDUR) 30 MG 24 hr tablet Take 0.5 tablets (15 mg total) by mouth daily. 02/14/17   Lavina Hamman, MD  LANTUS SOLOSTAR 100  UNIT/ML Solostar Pen Inject 9 Units into the skin at bedtime.  12/21/13   [provider]  Liniments (SALONPAS PAIN RELIEF PATCH EX) Place 1 patch onto the skin daily as needed (for pain relief.).     [provider]  lisinopril (PRINIVIL,ZESTRIL) 5 MG tablet Take 5 mg by mouth daily.    [provider]  metoprolol succinate (TOPROL XL) 25 MG 24 hr tablet Take 0.5 tablets (12.5 mg total) by mouth daily. Patient taking differently: Take 12.5 mg by mouth every Monday, Wednesday, and Friday. In the afternoon after dialysis. 02/13/17 02/13/18  Lavina Hamman, MD  nitroGLYCERIN (NITROSTAT) 0.4 MG SL tablet Place 1 tablet (0.4 mg total) under the tongue every 5 (five) minutes as needed for chest pain. 11/18/14   Samuella Cota, MD  omega-3 acid ethyl esters (LOVAZA) 1 g capsule Take 1 g by mouth daily.    [provider]  omeprazole (PRILOSEC) 20 MG capsule Take 1 capsule by mouth 2 (two) times daily. 03/20/17   [provider]    ondansetron (ZOFRAN-ODT) 4 MG disintegrating tablet Take 4 mg by mouth every 8 (eight) hours as needed for nausea or vomiting.    [provider]  oxyCODONE-acetaminophen (PERCOCET/ROXICET) 5-325 MG tablet Take 1 tablet by mouth every 6 (six) hours as needed. 05/12/17   Milton Ferguson, MD    Family History Family History  Problem Relation Age of Onset  . Cancer Mother   . Colon cancer Neg Hx     Social History Social History   Tobacco Use  . Smoking status: Former Smoker    Last attempt to quit: 08/08/2004    Years since quitting: 12.7  . Smokeless tobacco: Never Used  Substance Use Topics  . Alcohol use: No  . Drug use: No     Allergies   Codeine and Yellow jacket venom [bee venom]   Review of Systems Review of Systems  Constitutional: Negative for appetite change and fatigue.  HENT: Negative for congestion, ear discharge and sinus pressure.   Eyes: Negative for discharge.  Respiratory: Negative for cough.   Cardiovascular: Positive for chest pain.  Gastrointestinal: Negative for abdominal pain and diarrhea.  Genitourinary: Negative for frequency and hematuria.  Musculoskeletal: Negative for back pain.  Skin: Negative for rash.  Neurological: Negative for seizures and headaches.  Psychiatric/Behavioral: Negative for hallucinations.     Physical Exam Updated Vital Signs BP (!) 187/65 (BP Location: Right Arm)   Pulse (!) 57   Temp 97.7 F (36.5 C) (Oral)   Resp 15   Wt 74.8 kg (165 lb)   SpO2 100%   BMI 32.22 kg/m   Physical Exam  Constitutional: He is oriented to person, place, and time. He appears well-developed.  HENT:  Head: Normocephalic.  Eyes: Conjunctivae and EOM are normal. No scleral icterus.  Neck: Neck supple. No thyromegaly present.  Cardiovascular: Normal rate and regular rhythm. Exam reveals no gallop and no friction rub.  No murmur heard. Pulmonary/Chest: No stridor. He has no wheezes. He has no rales. He exhibits no tenderness.   Abdominal: He exhibits no distension. There is no tenderness. There is no rebound.  Musculoskeletal: Normal range of motion. He exhibits no edema.  Lymphadenopathy:    He has no cervical adenopathy.  Neurological: He is oriented to person, place, and time. He exhibits normal muscle tone. Coordination normal.  Skin: No rash noted. No erythema.  Psychiatric: He has a normal mood and affect. His behavior is normal.  ED Treatments / Results  Labs (all labs ordered are listed, but only abnormal results are displayed) Labs Reviewed  CBC WITH DIFFERENTIAL/PLATELET - Abnormal; Notable for the following components:      Result Value   RBC 3.69 (*)    Hemoglobin 11.5 (*)    HCT 35.1 (*)    All other components within normal limits  BASIC METABOLIC PANEL - Abnormal; Notable for the following components:   Sodium 134 (*)    Potassium 3.2 (*)    Chloride 96 (*)    Glucose, Bld 157 (*)    BUN 35 (*)    Creatinine, Ser 3.13 (*)    Calcium 7.7 (*)    GFR calc non Af Amer 18 (*)    GFR calc Af Amer 20 (*)    All other components within normal limits  TROPONIN I  TROPONIN I    EKG EKG Interpretation  Date/Time:  Monday May 12 2017 13:44:21 EDT Ventricular Rate:  77 PR Interval:    QRS Duration: 90 QT Interval:  421 QTC Calculation: 477 R Axis:   -9 Text Interpretation:  Sinus rhythm Borderline prolonged QT interval Confirmed by Milton Ferguson [phone]) on 05/12/2017 3:49:49 PM   Radiology Dg Chest Portable 1 View  Result Date: 05/12/2017 CLINICAL DATA:  Chest pain EXAM: PORTABLE CHEST 1 VIEW COMPARISON:  March 06, 2017 FINDINGS: There is mild left base atelectasis. The lungs elsewhere clear. Heart is mildly enlarged with pulmonary vascularity within normal limits. Patient is status post coronary artery bypass grafting. No adenopathy. There is degenerative change in each shoulder with superior migration of each humeral head. IMPRESSION: Left base atelectasis. No edema or  consolidation. Stable cardiac silhouette. 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 Electronically Signed   By: Lowella Grip III M.D.   On: 05/12/2017 14:51    Procedures Procedures (including critical care time)  Medications Ordered in ED Medications  oxyCODONE-acetaminophen (PERCOCET/ROXICET) 5-325 MG per tablet 1 tablet (1 tablet Oral Given 05/12/17 1406)  oxyCODONE-acetaminophen (PERCOCET/ROXICET) 5-325 MG per tablet 1 tablet (1 tablet Oral Given 05/12/17 1754)     Initial Impression / Assessment and Plan / ED Course  I have reviewed the triage vital signs and the nursing notes.  Pertinent labs & imaging results that were available during my care of the patient were reviewed by me and considered in my medical decision making (see chart for details).     Patient had 2 troponins that were normal.  This is non-cardiac chest pain and he Jakiah follow-up with his primary care doctor and get his dialysis in 2 days  Final Clinical Impressions(s) / ED Diagnoses   Final diagnoses:  Chest pain at rest    ED Discharge Orders        Ordered    oxyCODONE-acetaminophen (PERCOCET/ROXICET) 5-325 MG tablet  Every 6 hours PRN     05/12/17 1936       Milton Ferguson, MD 05/13/17 1929

## 2017-07-07 ENCOUNTER — Other Ambulatory Visit: Payer: Self-pay | Admitting: *Deleted

## 2017-07-09 NOTE — Patient Outreach (Signed)
[facility]) Care Management  05/01/2017  Javion Bancroft 04-13-1940 [id number]   Care coordination  Cm spoke with Mrs Erhardt about Mr Rawlinson needs Mrs Faulcon reports Mr Freer is being followed by home health services and she plus her son continues to assist.   When CM inquired about possible skilled nursing services, Mrs Wernette informed CM that the preference was to continue with home management at this time, not skilled nursing services   Plan to follow up with pt, wife and family in 6-8 weeks for possible further needs   Joelene Millin L. Lavina Hamman, RN, BSN, CCM [facility] Telephonic Care Management Care Coordinator Direct number [phone]  Main [facility] number [phone] Fax number [phone]

## 2017-07-09 NOTE — Patient Outreach (Signed)
[facility]) Care Management  07/09/2017  Alan [name] December 28, 1940 [id number]   Care coordination/case closure  [facility] CM spoke with Alan [name] on 05/06/17 about Alan [name] care at home. Needs were not identified  [facility] CM spoke with Alan [name] on 07/01/17 when she called for concerns with Alan [name] On 07/01/17 medical needs were not identified and the preference remains to continue to have Alan [name] taken care at home.  Alan [name] offered and denied CM spoke with Alan [name]   Plans case closure goals met no further needs identified letter to patient and MD  [facility] CM Care Plan Problem One     Most Recent Value  Care Plan Problem One  Medication Management Concerns  Role Documenting the Problem One  Care Management [place] for Problem One  Active  [facility] Long Term Goal   Over the next 60 days, patient's spouse and adult child Alan [name] demonstrate ability to correctly administer prescribed medications  [facility] Long Term Goal Start Date  02/27/17  [facility] Long Term Goal Met Date  05/01/17  [facility] CM Short Term Goal #1   Over the next 30 days, patient's spouse and daughter Alan [name] correctly refill pill box as instructed  [facility] CM Short Term Goal #1 Start Date  02/27/17  [facility] CM Short Term Goal #1 Met Date  05/01/17    [facility] CM Care Plan Problem Two     Most Recent Value  Care Plan Problem Two  Home Care/Custodial Care Needs  Role Documenting the Problem Two  Care Management Coordinator  Care Plan for Problem Two  Active  [facility] Long Term Goal  Over the next 31 days, patients' spouse/family Alan [name] verablize understanding of home care/custodial care resources availabe to patient  [facility] Long Term Goal Start Date  02/27/17  [facility] Long Term Goal Met Date  05/01/17  [facility] CM Short Term Goal #1   Over the next 30 days, patient/spouse/family Alan [name] work actively with home health agency  [facility] CM Short Term Goal #1 Start Date  02/27/17  [facility] CM Short Term Goal #1 Met Date   05/01/17  [facility] CM Short Term Goal  #2   Over the next 30 days, patient's spouse/caregiver Alan [name] verbalize understanding of custodial care options available  [facility] CM Short Term Goal #2 Start Date  02/27/17  [facility] CM Short Term Goal #2 Met Date  05/01/17    [facility] CM Care Plan Problem Three     Most Recent Value  Care Plan Problem Three  DME Needs  Role Documenting the Problem Three  Care Management Coordinator  Care Plan for Problem Three  Active  [facility] CM Short Term Goal #1   Over the next 30 days, patient's family/caregivers Alan [name] verbalize understanding of benefits and availability re: hospital bed for home  [facility] CM Short Term Goal #1 Start Date  02/27/17  [facility] CM Short Term Goal #1 Met Date  05/01/17      Alan Millin L. Lavina Hamman, RN, BSN, [place] Coordinator Direct number [phone]  Main [facility] number [phone] Fax number [phone]

## 2017-08-29 ENCOUNTER — Other Ambulatory Visit: Payer: Self-pay

## 2017-08-29 ENCOUNTER — Encounter (HOSPITAL_COMMUNITY): Payer: Self-pay | Admitting: *Deleted

## 2017-08-29 ENCOUNTER — Emergency Department (HOSPITAL_COMMUNITY): Payer: Medicare Other

## 2017-08-29 ENCOUNTER — Emergency Department (HOSPITAL_COMMUNITY)
Admission: EM | Admit: 2017-08-29 | Discharge: 2017-08-30 | Disposition: A | Discharge status: Home / Self Care | Payer: Medicare Other | Attending: Emergency Medicine | Admitting: Emergency Medicine

## 2017-08-29 DIAGNOSIS — Z7982 Long term (current) use of aspirin: Secondary | ICD-10-CM | POA: Insufficient documentation

## 2017-08-29 DIAGNOSIS — I2510 Atherosclerotic heart disease of native coronary artery without angina pectoris: Secondary | ICD-10-CM | POA: Diagnosis not present

## 2017-08-29 DIAGNOSIS — I120 Hypertensive chronic kidney disease with stage 5 chronic kidney disease or end stage renal disease: Secondary | ICD-10-CM | POA: Diagnosis not present

## 2017-08-29 DIAGNOSIS — R0789 Other chest pain: Secondary | ICD-10-CM

## 2017-08-29 DIAGNOSIS — Z992 Dependence on renal dialysis: Secondary | ICD-10-CM | POA: Diagnosis not present

## 2017-08-29 DIAGNOSIS — Z794 Long term (current) use of insulin: Secondary | ICD-10-CM | POA: Diagnosis not present

## 2017-08-29 DIAGNOSIS — R0602 Shortness of breath: Secondary | ICD-10-CM | POA: Diagnosis not present

## 2017-08-29 DIAGNOSIS — Z87891 Personal history of nicotine dependence: Secondary | ICD-10-CM | POA: Diagnosis not present

## 2017-08-29 DIAGNOSIS — E1122 Type 2 diabetes mellitus with diabetic chronic kidney disease: Secondary | ICD-10-CM | POA: Diagnosis not present

## 2017-08-29 DIAGNOSIS — N186 End stage renal disease: Secondary | ICD-10-CM

## 2017-08-29 DIAGNOSIS — Z79899 Other long term (current) drug therapy: Secondary | ICD-10-CM | POA: Diagnosis not present

## 2017-08-29 DIAGNOSIS — Z951 Presence of aortocoronary bypass graft: Secondary | ICD-10-CM | POA: Insufficient documentation

## 2017-08-29 LAB — CBC WITH DIFFERENTIAL/PLATELET
Basophils Absolute: 0 10*3/uL (ref 0.0–0.1)
Basophils Relative: 0 %
EOS ABS: 0.1 10*3/uL (ref 0.0–0.7)
EOS PCT: 2 %
HCT: 33.4 % — ABNORMAL LOW (ref 39.0–52.0)
Hemoglobin: 11.2 g/dL — ABNORMAL LOW (ref 13.0–17.0)
LYMPHS ABS: 1.2 10*3/uL (ref 0.7–4.0)
Lymphocytes Relative: 17 %
MCH: 31.7 pg (ref 26.0–34.0)
MCHC: 33.5 g/dL (ref 30.0–36.0)
MCV: 94.6 fL (ref 78.0–100.0)
MONO ABS: 0.5 10*3/uL (ref 0.1–1.0)
Monocytes Relative: 7 %
Neutro Abs: 5.3 10*3/uL (ref 1.7–7.7)
Neutrophils Relative %: 74 %
PLATELETS: 171 10*3/uL (ref 150–400)
RBC: 3.53 MIL/uL — AB (ref 4.22–5.81)
RDW: 13.5 % (ref 11.5–15.5)
WBC: 7.2 10*3/uL (ref 4.0–10.5)

## 2017-08-29 LAB — BASIC METABOLIC PANEL
Anion gap: 6 (ref 5–15)
BUN: 31 mg/dL — ABNORMAL HIGH (ref 8–23)
CALCIUM: 7.8 mg/dL — AB (ref 8.9–10.3)
CO2: 28 mmol/L (ref 22–32)
Chloride: 98 mmol/L (ref 98–111)
Creatinine, Ser: 2.62 mg/dL — ABNORMAL HIGH (ref 0.61–1.24)
GFR calc Af Amer: 25 mL/min — ABNORMAL LOW (ref >60)
GFR calc non Af Amer: 22 mL/min — ABNORMAL LOW (ref >60)
Glucose, Bld: 160 mg/dL — ABNORMAL HIGH (ref 70–99)
Potassium: 3.5 mmol/L (ref 3.5–5.1)
SODIUM: 132 mmol/L — AB (ref 135–145)

## 2017-08-29 LAB — TROPONIN I: Troponin I: <0.03 ng/mL (ref <0.03)

## 2017-08-29 MED ORDER — ASPIRIN 81 MG PO CHEW
324.0000 mg | CHEWABLE_TABLET | Freq: Once | ORAL | Status: AC
  Administered 2017-08-29: 324 mg via ORAL
  Filled 2017-08-29: qty 4

## 2017-08-29 MED ORDER — ACETAMINOPHEN 325 MG PO TABS
650.0000 mg | ORAL_TABLET | Freq: Once | ORAL | Status: AC
  Administered 2017-08-29: 650 mg via ORAL
  Filled 2017-08-29: qty 2

## 2017-08-29 NOTE — Discharge Instructions (Signed)
Take over the counter tylenol, as directed on packaging, as needed for discomfort.  Apply moist heat or ice to the area(s) of discomfort, for 15 minutes at a time, several times per day for the next few days.  Do not fall asleep on a heating or ice pack.  Call your regular medical doctor and your Cardiologist on Monday to schedule a follow up appointment next week.  Return to the Emergency Department immediately if worsening.

## 2017-08-29 NOTE — ED Triage Notes (Signed)
Patient with initial complaint of SOB during dialysis, chest pain began "3 hours ago".  Palpation per EMS on left upper chest, with increased pain. One dose  Nitro given at Dialysis with no improvement.  Patient was 3 hours into 4 hours dialysis treatment when SOB began.  Patient goes to Dialysis 3 days week Monday, Wednesday and Friday.  Shunt in left forearm.

## 2017-08-29 NOTE — ED Provider Notes (Signed)
[facility] EMERGENCY DEPARTMENT Provider Note   CSN: [id number] Arrival date & time: 08/29/17  1633     History   Chief Complaint Chief Complaint  Patient presents with  . Chest Pain    HPI Alan [name] is a 77 y.o. male.   Chest Pain      Pt was seen at 1640. Per EMS and pt report: Pt c/o gradual onset and persistence of constant left upper chest wall "pain" since yesterday afternoon. Pt describes the pain as "burning" and "aching," worsens with palpation of the area. Pain has been constant since onset yesterday afternoon. HD staff pt stated he felt SOB during HD today and ended his session 1 hour early; pt does not complain of SOB to me. Pt was given SL ntg without improvement. Denies palpitations, no cough, no abd pain, no N/V/D, no back pain, no fevers, no injury.    Past Medical History:  Diagnosis Date  . Arthritis   . Asthma   . Clotted renal dialysis arteriovenous graft ([place])   . Coronary artery disease    a. s/p CABG in 1996 with LIMA-LAD and SVG-OM1 b. 01/2017: cath showing occluded SVG-OM1 and atretic LIMA-LAD. Diffuse disease along 1st Mrg and PDA with medical therapy recommended.   . CVA (cerebral infarction)   . DDD (degenerative disc disease), cervical   . DDD (degenerative disc disease), lumbar   . Depression   . Diabetes mellitus   . 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 Gangrene of toe ([place]) Feb. 2015   Right  great    . Gastric ulcer    EGD 2014  . Gout   . Hypercholesteremia   . Hypertension   . Stroke ([place])   . 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 Thrombosis of renal dialysis arteriovenous graft [facility])     Patient Active Problem List   Diagnosis Date Noted  . Left sided numbness 02/10/2017  . Hypertensive urgency 02/10/2017  . Hypokalemia 02/10/2017  . Chest pain 06/15/2016  . Renal dialysis device, implant, or graft complication 28/41/3244  . Septic shock ([place]) 08/28/2015  . 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  Lower urinary tract infectious disease   . Essential hypertension 08/13/2015  . Nausea and vomiting 08/13/2015  . ESRD (end stage renal disease) ([place]) 04/12/2015  . Gastroenteritis 04/12/2015  . S/P bilateral BKA (below knee amputation) ([place]) 04/12/2015  . HLD (hyperlipidemia) 04/12/2015  . Acute chest pain 11/16/2014  . Atherosclerosis of native arteries of the extremities with gangrene ([place]) 04/20/2013  . Sepsis ([place]) 04/13/2013  . Gram-negative bacteremia 04/12/2013  . ESRD on dialysis ([place]) 04/09/2013  . 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 Protein-calorie malnutrition, severe ([place]) 04/09/2013  . Gangrene of toe ([place]) 04/08/2013  . Toe gangrene ([place]) 04/08/2013  . Nausea with vomiting 05/22/2012  . S/P BKA (below knee amputation) unilateral, left 05/20/2012  . Musculoskeletal chest pain 05/20/2012  . Acute renal failure ([place]) 05/19/2012  . CAD (coronary artery disease) 05/19/2012  . DM type 2 causing vascular disease ([place]) 05/19/2012  . Hyperkalemia 05/19/2012    Past Surgical History:  Procedure Laterality Date  . A/V FISTULAGRAM Left 05/28/2016   Procedure: A/V Fistulagram;  Surgeon: Katha Cabal, MD;  Location: [place] CV LAB;  Service: Cardiovascular;  Laterality: Left;  . A/V FISTULAGRAM Left 10/28/2016   Procedure: A/V Fistulagram;  Surgeon: Algernon Huxley, MD;  Location: [place] CV LAB;  Service: Cardiovascular;  Laterality: Left;  . A/V FISTULAGRAM Left  03/03/2017   Procedure: A/V FISTULAGRAM;  Surgeon: Algernon Huxley, MD;  Location: [place] CV LAB;  Service: Cardiovascular;  Laterality: Left;  . A/V SHUNT INTERVENTION N/A 05/28/2016   Procedure: A/V Shunt Intervention;  Surgeon: Katha Cabal, MD;  Location: [place] CV LAB;  Service: Cardiovascular;  Laterality: N/A;  . A/V SHUNT INTERVENTION N/A 03/03/2017   Procedure: A/V SHUNT INTERVENTION;  Surgeon: Algernon Huxley, MD;  Location: [place] CV LAB;  Service: Cardiovascular;   Laterality: N/A;  . AMPUTATION Right 04/10/2013   Procedure: Right Below Knee Amputation;  Surgeon: Newt Minion, MD;  Location: [place];  Service: Orthopedics;  Laterality: Right;  Right Below Knee Amputation  . APPENDECTOMY    . BELOW KNEE LEG AMPUTATION Bilateral   . CHOLECYSTECTOMY    . CORONARY ARTERY BYPASS GRAFT  1996   [place]  . ESOPHAGOGASTRODUODENOSCOPY Left 05/24/2012   VEL:FYBOFBPZ dilated baggy but otherwise a normal/ Small hiatal hernia. Gastric ulcer -S/P biopsy  . LEFT HEART CATH AND CORS/GRAFTS ANGIOGRAPHY N/A 02/12/2017   Procedure: LEFT HEART CATH AND CORS/GRAFTS ANGIOGRAPHY;  Surgeon: Lorretta Harp, MD;  Location: [place] CV LAB;  Service: Cardiovascular;  Laterality: N/A;  . PERIPHERAL VASCULAR CATHETERIZATION N/A 08/09/2014   Procedure: A/V Shuntogram/Fistulagram;  Surgeon: Katha Cabal, MD;  Location: [place] CV LAB;  Service: Cardiovascular;  Laterality: N/A;  . PERIPHERAL VASCULAR CATHETERIZATION Left 08/09/2014   Procedure: A/V Shunt Intervention;  Surgeon: Katha Cabal, MD;  Location: [place] CV LAB;  Service: Cardiovascular;  Laterality: Left;        Home Medications    Prior to Admission medications   Medication Sig Start Date End Date Taking? Authorizing Provider  ACCU-CHEK AVIVA PLUS test strip  03/23/16   [provider]  acetaminophen (TYLENOL) 500 MG tablet Take 1,000 mg by mouth every 8 (eight) hours as needed for mild pain or moderate pain.     [provider]  Amino Acid Infusion (PROSOL) 20 % SOLN Inject 1 vial into the vein once a week. 02/28/17   [provider]  amLODipine (NORVASC) 5 MG tablet Take 5 mg by mouth daily.    [provider]  Ascorbic Acid (VITAMIN C) 1000 MG tablet Take 1,000 mg by mouth daily.    [provider]  aspirin EC 81 MG tablet Take 81 mg by mouth daily.     [provider]  B Complex-C-Folic Acid (NEPHRO-VITE PO) Take 1 tablet by mouth at bedtime.      [provider]  budesonide-formoterol (SYMBICORT) 80-4.5 MCG/ACT inhaler Inhale 2 puffs into the lungs 2 (two) times daily as needed (for wheezing/shortness of breath.).    [provider]  cinacalcet (SENSIPAR) 30 MG tablet Take 30 mg by mouth at bedtime.     [provider]  Coenzyme Q10 (COQ10) 100 MG CAPS Take 100 mg by mouth at bedtime.    [provider]  diphenhydrAMINE (BENADRYL) 25 mg capsule Take 25 mg by mouth at bedtime.     [provider]  furosemide (LASIX) 20 MG tablet Take 20 mg by mouth daily.    [provider]  isosorbide mononitrate (IMDUR) 30 MG 24 hr tablet Take 0.5 tablets (15 mg total) by mouth daily. 02/14/17   Lavina Hamman, MD  LANTUS SOLOSTAR 100 UNIT/ML Solostar Pen Inject 9 Units into the skin at bedtime.  12/21/13   [provider]  Liniments ([place]) Place  1 patch onto the skin daily as needed (for pain relief.).     [provider]  lisinopril (PRINIVIL,ZESTRIL) 5 MG tablet Take 5 mg by mouth daily.    [provider]  metoprolol succinate (TOPROL XL) 25 MG 24 hr tablet Take 0.5 tablets (12.5 mg total) by mouth daily. Patient taking differently: Take 12.5 mg by mouth every Monday, Wednesday, and Friday. In the afternoon after dialysis. 02/13/17 02/13/18  Lavina Hamman, MD  nitroGLYCERIN (NITROSTAT) 0.4 MG SL tablet Place 1 tablet (0.4 mg total) under the tongue every 5 (five) minutes as needed for chest pain. 11/18/14   Samuella Cota, MD  omega-3 acid ethyl esters (LOVAZA) 1 g capsule Take 1 g by mouth daily.    [provider]  omeprazole (PRILOSEC) 20 MG capsule Take 1 capsule by mouth 2 (two) times daily. 03/20/17   [provider]  ondansetron (ZOFRAN-ODT) 4 MG disintegrating tablet Take 4 mg by mouth every 8 (eight) hours as needed for nausea or vomiting.    [provider]  oxyCODONE-acetaminophen (PERCOCET/ROXICET) 5-325  MG tablet Take 1 tablet by mouth every 6 (six) hours as needed. 05/12/17   Milton Ferguson, MD    Family History Family History  Problem Relation Age of Onset  . Cancer Mother   . Colon cancer Neg Hx     Social History Social History   Tobacco Use  . Smoking status: Former Smoker    Last attempt to quit: 08/08/2004    Years since quitting: 13.0  . Smokeless tobacco: Never Used  Substance Use Topics  . Alcohol use: No  . Drug use: No     Allergies   Codeine and Yellow jacket venom [bee venom]   Review of Systems Review of Systems  Cardiovascular: Positive for chest pain.  ROS: Statement: All systems negative except as marked or noted in the HPI; Constitutional: Negative for fever and chills. ; ; Eyes: Negative for eye pain, redness and discharge. ; ; ENMT: Negative for ear pain, hoarseness, nasal congestion, sinus pressure and sore throat. ; ; Cardiovascular: +CP. Negative for palpitations, diaphoresis, and peripheral edema. ; ; Respiratory: +SOB (told to HD staff). Negative for cough, wheezing and stridor. ; ; Gastrointestinal: Negative for nausea, vomiting, diarrhea, abdominal pain, blood in stool, hematemesis, jaundice and rectal bleeding. . ; ; Genitourinary: Negative for dysuria, flank pain and hematuria. ; ; Musculoskeletal: Negative for back pain and neck pain. Negative for swelling and trauma.; ; Skin: Negative for pruritus, rash, abrasions, blisters, bruising and skin lesion.; ; Neuro: Negative for headache, lightheadedness and neck stiffness. Negative for weakness, altered level of consciousness, altered mental status, extremity weakness, paresthesias, involuntary movement, seizure and syncope.        Physical Exam Updated Vital Signs BP (!) 153/73 (BP Location: Right Arm)   Pulse 78   Temp 97.8 F (36.6 C) (Oral)   Resp 12   SpO2 100%     Physical Exam 1645: Physical examination:  Nursing notes reviewed; Vital signs and O2 SAT reviewed;  Constitutional: Well  developed, Well nourished, Well hydrated, In no acute distress; Head:  Normocephalic, atraumatic; Eyes: EOMI, PERRL, No scleral icterus; ENMT: Mouth and pharynx normal, Mucous membranes moist; Neck: Supple, Full range of motion, No lymphadenopathy; Cardiovascular: Regular rate and rhythm, No gallop; Respiratory: Breath sounds clear & equal bilaterally, No wheezes.  Speaking full sentences with ease, Normal respiratory effort/excursion; Chest: +left upper chest wall tender to palp. No deformity, no rash, no soft  tissue crepitus. Movement normal; Abdomen: Soft, Nontender, Nondistended, Normal bowel sounds; Genitourinary: No CVA tenderness; Extremities: Peripheral pulses normal, No tenderness, No edema, Left UE AV fistula. Bilat LE's BKA's. .; Neuro: AA&Ox3, Major CN grossly intact.  Speech clear. No gross focal motor deficits in extremities.; Skin: Color normal, Warm, Dry.   ED Treatments / Results  Labs (all labs ordered are listed, but only abnormal results are displayed)   EKG EKG Interpretation  Date/Time:  Friday August 29 2017 16:34:08 EDT Ventricular Rate:  81 PR Interval:    QRS Duration: 92 QT Interval:  400 QTC Calculation: 465 R Axis:   -9 Text Interpretation:  Sinus rhythm When compared with ECG of 05/12/2017 No significant change was found Confirmed by Francine Graven [phone]) on 08/29/2017 4:54:42 PM   Radiology   Procedures Procedures (including critical care time)  Medications Ordered in ED Medications  aspirin chewable tablet 324 mg (324 mg Oral Given 08/29/17 1657)  acetaminophen (TYLENOL) tablet 650 mg (650 mg Oral Given 08/29/17 1657)     Initial Impression / Assessment and Plan / ED Course  I have reviewed the triage vital signs and the nursing notes.  Pertinent labs & imaging results that were available during my care of the patient were reviewed by me and considered in my medical decision making (see chart for details).  MDM Reviewed: previous chart, vitals and  nursing note Reviewed previous: labs and ECG Interpretation: labs, ECG and x-ray   Results for orders placed or performed during the hospital encounter of 17/00/17  Basic metabolic panel  Result Value Ref Range   Sodium 132 (L) 135 - 145 mmol/L   Potassium 3.5 3.5 - 5.1 mmol/L   Chloride 98 98 - 111 mmol/L   CO2 28 22 - 32 mmol/L   Glucose, Bld 160 (H) 70 - 99 mg/dL   BUN 31 (H) 8 - 23 mg/dL   Creatinine, Ser 2.62 (H) 0.61 - 1.24 mg/dL   Calcium 7.8 (L) 8.9 - 10.3 mg/dL   GFR calc non Af Amer 22 (L) >60 mL/min   GFR calc Af Amer 25 (L) >60 mL/min   Anion gap 6 5 - 15  Troponin I  Result Value Ref Range   Troponin I <0.03 <0.03 ng/mL  CBC with Differential  Result Value Ref Range   WBC 7.2 4.0 - 10.5 K/uL   RBC 3.53 (L) 4.22 - 5.81 MIL/uL   Hemoglobin 11.2 (L) 13.0 - 17.0 g/dL   HCT 33.4 (L) 39.0 - 52.0 %   MCV 94.6 78.0 - 100.0 fL   MCH 31.7 26.0 - 34.0 pg   MCHC 33.5 30.0 - 36.0 g/dL   RDW 13.5 11.5 - 15.5 %   Platelets 171 150 - 400 K/uL   Neutrophils Relative % 74 %   Neutro Abs 5.3 1.7 - 7.7 K/uL   Lymphocytes Relative 17 %   Lymphs Abs 1.2 0.7 - 4.0 K/uL   Monocytes Relative 7 %   Monocytes Absolute 0.5 0.1 - 1.0 K/uL   Eosinophils Relative 2 %   Eosinophils Absolute 0.1 0.0 - 0.7 K/uL   Basophils Relative 0 %   Basophils Absolute 0.0 0.0 - 0.1 K/uL  Troponin I  Result Value Ref Range   Troponin I <0.03 <0.03 ng/mL   Dg Chest Port 1 View Result Date: 08/29/2017 CLINICAL DATA:  77 y/o M; shortness of breath 30 dialysis and left-sided chest pain. EXAM: PORTABLE CHEST 1 VIEW COMPARISON:  05/12/2017 chest radiograph FINDINGS: Stable  mild cardiomegaly. Linear opacities greatest in left mid and lower lung zones. No pleural effusion or pneumothorax. No consolidation. Diminished left acromial humeral interval probably representing rotator cuff injury is stable. IMPRESSION: Mild cardiomegaly and interstitial edema.  No consolidation. Electronically Signed   By: Kristine Garbe M.D.   On: 08/29/2017 17:13    2220:  EMS states pt told HD that he had SOB during HD; but denies this on arrival to the ED, only c/o left upper chest wall pain. Pt stated to ED RN that CP began 3 hours PTA, but told me that it had been ongoing/constant for the past 2 days. Workup reassuring. Doubt ACS as cause for symptoms with normal troponin x2 and unchanged EKG from previous after 2 days of constant symptoms. Doubt PE with low risk Wells'. Feels better after meds and is ready to go home now. Tx symptomatically at this time. Dx and testing d/w pt.  Questions answered.  Verb understanding, agreeable to d/c home with outpt f/u.     Final Clinical Impressions(s) / ED Diagnoses   Final diagnoses:  None    ED Discharge Orders    None       Francine Graven, DO 08/31/17 1745

## 2018-01-07 ENCOUNTER — Observation Stay (HOSPITAL_COMMUNITY)
Admission: EM | Admit: 2018-01-07 | Discharge: 2018-01-08 | Disposition: A | Discharge status: Home / Self Care | Payer: Medicare Other | Attending: Internal Medicine | Admitting: Internal Medicine

## 2018-01-07 ENCOUNTER — Other Ambulatory Visit: Payer: Self-pay

## 2018-01-07 ENCOUNTER — Emergency Department (HOSPITAL_COMMUNITY): Payer: Medicare Other

## 2018-01-07 ENCOUNTER — Encounter (HOSPITAL_COMMUNITY): Payer: Self-pay | Admitting: Emergency Medicine

## 2018-01-07 DIAGNOSIS — Z8719 Personal history of other diseases of the digestive system: Secondary | ICD-10-CM | POA: Insufficient documentation

## 2018-01-07 DIAGNOSIS — Z87891 Personal history of nicotine dependence: Secondary | ICD-10-CM | POA: Insufficient documentation

## 2018-01-07 DIAGNOSIS — R079 Chest pain, unspecified: Secondary | ICD-10-CM | POA: Diagnosis not present

## 2018-01-07 DIAGNOSIS — Z885 Allergy status to narcotic agent status: Secondary | ICD-10-CM | POA: Insufficient documentation

## 2018-01-07 DIAGNOSIS — I2510 Atherosclerotic heart disease of native coronary artery without angina pectoris: Secondary | ICD-10-CM | POA: Diagnosis not present

## 2018-01-07 DIAGNOSIS — M5136 Other intervertebral disc degeneration, lumbar region: Secondary | ICD-10-CM | POA: Insufficient documentation

## 2018-01-07 DIAGNOSIS — E7800 Pure hypercholesterolemia, unspecified: Secondary | ICD-10-CM | POA: Diagnosis not present

## 2018-01-07 DIAGNOSIS — I10 Essential (primary) hypertension: Secondary | ICD-10-CM | POA: Diagnosis present

## 2018-01-07 DIAGNOSIS — N186 End stage renal disease: Secondary | ICD-10-CM | POA: Insufficient documentation

## 2018-01-07 DIAGNOSIS — R001 Bradycardia, unspecified: Secondary | ICD-10-CM | POA: Diagnosis not present

## 2018-01-07 DIAGNOSIS — Z992 Dependence on renal dialysis: Secondary | ICD-10-CM | POA: Insufficient documentation

## 2018-01-07 DIAGNOSIS — Z951 Presence of aortocoronary bypass graft: Secondary | ICD-10-CM | POA: Diagnosis not present

## 2018-01-07 DIAGNOSIS — Z86718 Personal history of other venous thrombosis and embolism: Secondary | ICD-10-CM | POA: Insufficient documentation

## 2018-01-07 DIAGNOSIS — Z7982 Long term (current) use of aspirin: Secondary | ICD-10-CM | POA: Diagnosis not present

## 2018-01-07 DIAGNOSIS — Z89512 Acquired absence of left leg below knee: Secondary | ICD-10-CM | POA: Diagnosis not present

## 2018-01-07 DIAGNOSIS — J45909 Unspecified asthma, uncomplicated: Secondary | ICD-10-CM | POA: Insufficient documentation

## 2018-01-07 DIAGNOSIS — Z8673 Personal history of transient ischemic attack (TIA), and cerebral infarction without residual deficits: Secondary | ICD-10-CM | POA: Insufficient documentation

## 2018-01-07 DIAGNOSIS — Z794 Long term (current) use of insulin: Secondary | ICD-10-CM | POA: Insufficient documentation

## 2018-01-07 DIAGNOSIS — Z89511 Acquired absence of right leg below knee: Secondary | ICD-10-CM

## 2018-01-07 DIAGNOSIS — M109 Gout, unspecified: Secondary | ICD-10-CM | POA: Insufficient documentation

## 2018-01-07 DIAGNOSIS — E1122 Type 2 diabetes mellitus with diabetic chronic kidney disease: Secondary | ICD-10-CM | POA: Diagnosis not present

## 2018-01-07 DIAGNOSIS — I120 Hypertensive chronic kidney disease with stage 5 chronic kidney disease or end stage renal disease: Secondary | ICD-10-CM | POA: Insufficient documentation

## 2018-01-07 DIAGNOSIS — Z955 Presence of coronary angioplasty implant and graft: Secondary | ICD-10-CM | POA: Insufficient documentation

## 2018-01-07 DIAGNOSIS — I200 Unstable angina: Secondary | ICD-10-CM

## 2018-01-07 DIAGNOSIS — F329 Major depressive disorder, single episode, unspecified: Secondary | ICD-10-CM | POA: Insufficient documentation

## 2018-01-07 DIAGNOSIS — Z79899 Other long term (current) drug therapy: Secondary | ICD-10-CM | POA: Diagnosis not present

## 2018-01-07 LAB — CBC
HEMATOCRIT: 34.6 % — AB (ref 39.0–52.0)
Hemoglobin: 11 g/dL — ABNORMAL LOW (ref 13.0–17.0)
MCH: 31.2 pg (ref 26.0–34.0)
MCHC: 31.8 g/dL (ref 30.0–36.0)
MCV: 98 fL (ref 80.0–100.0)
Platelets: 171 10*3/uL (ref 150–400)
RBC: 3.53 MIL/uL — ABNORMAL LOW (ref 4.22–5.81)
RDW: 12.9 % (ref 11.5–15.5)
WBC: 7.6 10*3/uL (ref 4.0–10.5)
nRBC: 0 % (ref 0.0–0.2)

## 2018-01-07 LAB — BASIC METABOLIC PANEL
Anion gap: 8 (ref 5–15)
BUN: 37 mg/dL — AB (ref 8–23)
CHLORIDE: 97 mmol/L — AB (ref 98–111)
CO2: 27 mmol/L (ref 22–32)
CREATININE: 3.2 mg/dL — AB (ref 0.61–1.24)
Calcium: 7.8 mg/dL — ABNORMAL LOW (ref 8.9–10.3)
GFR calc Af Amer: 20 mL/min — ABNORMAL LOW (ref >60)
GFR calc non Af Amer: 17 mL/min — ABNORMAL LOW (ref >60)
Glucose, Bld: 190 mg/dL — ABNORMAL HIGH (ref 70–99)
Potassium: 3.7 mmol/L (ref 3.5–5.1)
SODIUM: 132 mmol/L — AB (ref 135–145)

## 2018-01-07 LAB — MAGNESIUM: Magnesium: 1.8 mg/dL (ref 1.7–2.4)

## 2018-01-07 LAB — I-STAT TROPONIN, ED: Troponin i, poc: 0.02 ng/mL (ref 0.00–0.08)

## 2018-01-07 LAB — GLUCOSE, CAPILLARY
GLUCOSE-CAPILLARY: 98 mg/dL (ref 70–99)
Glucose-Capillary: 136 mg/dL — ABNORMAL HIGH (ref 70–99)

## 2018-01-07 LAB — TROPONIN I
Troponin I: <0.03 ng/mL (ref <0.03)
Troponin I: <0.03 ng/mL (ref <0.03)

## 2018-01-07 MED ORDER — LISINOPRIL 5 MG PO TABS
5.0000 mg | ORAL_TABLET | Freq: Every day | ORAL | Status: DC
Start: 2018-01-07 — End: 2018-01-08
  Administered 2018-01-07 – 2018-01-08 (×2): 5 mg via ORAL
  Filled 2018-01-07 (×2): qty 1

## 2018-01-07 MED ORDER — MORPHINE SULFATE (PF) 2 MG/ML IV SOLN: 1.0000 mg | INTRAVENOUS | Status: DC | PRN

## 2018-01-07 MED ORDER — METOPROLOL SUCCINATE ER 25 MG PO TB24
12.5000 mg | ORAL_TABLET | Freq: Every day | ORAL | Status: DC
  Administered 2018-01-07: 12.5 mg via ORAL
  Filled 2018-01-07: qty 1

## 2018-01-07 MED ORDER — ONDANSETRON HCL 4 MG PO TABS: 4.0000 mg | ORAL_TABLET | Freq: Four times a day (QID) | ORAL | Status: DC | PRN

## 2018-01-07 MED ORDER — MAGNESIUM SULFATE 2 GM/50ML IV SOLN: 2.0000 g | Freq: Once | INTRAVENOUS | Status: DC

## 2018-01-07 MED ORDER — POLYETHYLENE GLYCOL 3350 17 G PO PACK
17.0000 g | PACK | Freq: Every day | ORAL | Status: DC | PRN
  Administered 2018-01-07: 17 g via ORAL
  Filled 2018-01-07 (×2): qty 1

## 2018-01-07 MED ORDER — CINACALCET HCL 30 MG PO TABS
30.0000 mg | ORAL_TABLET | Freq: Every day | ORAL | Status: DC
  Administered 2018-01-07: 30 mg via ORAL
  Filled 2018-01-07 (×2): qty 1

## 2018-01-07 MED ORDER — ATORVASTATIN CALCIUM 40 MG PO TABS
80.0000 mg | ORAL_TABLET | Freq: Every day | ORAL | Status: DC
  Administered 2018-01-07: 80 mg via ORAL
  Filled 2018-01-07: qty 2

## 2018-01-07 MED ORDER — HEPARIN SODIUM (PORCINE) 5000 UNIT/ML IJ SOLN
5000.0000 [IU] | Freq: Three times a day (TID) | INTRAMUSCULAR | Status: DC
  Administered 2018-01-07 – 2018-01-08 (×4): 5000 [IU] via SUBCUTANEOUS
  Filled 2018-01-07 (×4): qty 1

## 2018-01-07 MED ORDER — ACETAMINOPHEN 325 MG PO TABS: 650.0000 mg | ORAL_TABLET | Freq: Four times a day (QID) | ORAL | Status: DC | PRN

## 2018-01-07 MED ORDER — PANTOPRAZOLE SODIUM 40 MG PO TBEC
40.0000 mg | DELAYED_RELEASE_TABLET | Freq: Every day | ORAL | Status: DC
  Administered 2018-01-07 – 2018-01-08 (×2): 40 mg via ORAL
  Filled 2018-01-07 (×2): qty 1

## 2018-01-07 MED ORDER — INSULIN ASPART 100 UNIT/ML ~~LOC~~ SOLN
0.0000 [IU] | Freq: Three times a day (TID) | SUBCUTANEOUS | Status: DC
  Administered 2018-01-08: 1 [IU] via SUBCUTANEOUS

## 2018-01-07 MED ORDER — ACETAMINOPHEN 650 MG RE SUPP: 650.0000 mg | Freq: Four times a day (QID) | RECTAL | Status: DC | PRN

## 2018-01-07 MED ORDER — ASPIRIN EC 81 MG PO TBEC
81.0000 mg | DELAYED_RELEASE_TABLET | Freq: Every day | ORAL | Status: DC
  Administered 2018-01-07 – 2018-01-08 (×2): 81 mg via ORAL
  Filled 2018-01-07 (×2): qty 1

## 2018-01-07 MED ORDER — ONDANSETRON HCL 4 MG/2ML IJ SOLN: 4.0000 mg | Freq: Four times a day (QID) | INTRAMUSCULAR | Status: DC | PRN

## 2018-01-07 MED ORDER — MOMETASONE FURO-FORMOTEROL FUM 100-5 MCG/ACT IN AERO
2.0000 | INHALATION_SPRAY | Freq: Two times a day (BID) | RESPIRATORY_TRACT | Status: DC
  Administered 2018-01-07 – 2018-01-08 (×2): 2 via RESPIRATORY_TRACT
  Filled 2018-01-07 (×2): qty 8.8

## 2018-01-07 MED ORDER — NITROGLYCERIN 0.4 MG SL SUBL
0.4000 mg | SUBLINGUAL_TABLET | SUBLINGUAL | Status: DC | PRN
  Administered 2018-01-07: 0.4 mg via SUBLINGUAL

## 2018-01-07 MED ORDER — MORPHINE SULFATE (PF) 2 MG/ML IV SOLN
2.0000 mg | Freq: Once | INTRAVENOUS | Status: AC
  Administered 2018-01-07: 2 mg via INTRAVENOUS
  Filled 2018-01-07: qty 1

## 2018-01-07 MED ORDER — ISOSORBIDE MONONITRATE ER 30 MG PO TB24
15.0000 mg | ORAL_TABLET | Freq: Every day | ORAL | Status: DC
  Administered 2018-01-07 – 2018-01-08 (×2): 15 mg via ORAL
  Filled 2018-01-07 (×2): qty 1

## 2018-01-07 NOTE — Consult Note (Signed)
Cardiology Consultation:   Patient ID: Alan [name] MRN: [id number]; DOB: [birth year]  Admit date: 01/07/2018 Date of Consult: 01/07/2018  Primary Care Provider: [facility] Primary Cardiologist:  Kael Keetch Primary Electrophysiologist:  None    Patient Profile:   Alan [name] is a 77 y.o. male with a hx of CAD who is being seen today for the evaluation of chest pain at the request of Dr Denton Brick.  History of Present Illness:   [name] 77 yo male history of CAD prior prior CABG 1994 at [facility] 2 vessel: LIMA-LAD, SVG-OM1 and stenting, HTN, DM, PAD with bilateral ampuations, ESRD, chronic chest pain presents with chest pain.  Admit 01/2017 with angina. Cath showed  Mid RCA 50%, RPLB second Truda Staub 90%, OM1 70%, LCX 100%. Occluded LIMA-LAD, occluded SV-OM1. Recs for medical therapy of his OM disease 01/2017 echo LVEF 55-60%, grade II diastolic dysfunction  Notes indicate admit to [facility]  just a few days ago. From notes presented to ER after developing chest pain during HD. Trop 0.15, transferred from [facility] to [facility]. Trop there was negative. Thought to be demand ischemic from his HD and managed medically. Echo during that admit LVEF >55%   Presents today with chest pain during HD. Dull pain left chest, 10/10 with some SOB. Similar to his chronic pains but more intense. Improved with SL NG.    K 3.7 Cr 3.2 BUN 37 WBC 7.6 Hgb 11 Plt 171  Trop neg CXR no acute process  EKG SR, no ischemic changes  Past Medical History:  Diagnosis Date  . Arthritis   . Asthma   . Clotted renal dialysis arteriovenous graft ([place])   . Coronary artery disease    a. s/p CABG in 1996 with LIMA-LAD and SVG-OM1 b. 01/2017: cath showing occluded SVG-OM1 and atretic LIMA-LAD. Diffuse disease along 1st Mrg and PDA with medical therapy recommended.   . CVA (cerebral infarction)   . DDD (degenerative disc disease), cervical   . DDD (degenerative disc disease), lumbar   .  Depression   . Diabetes mellitus   . ESRD (end stage renal disease) on dialysis ([place])   . Gangrene of toe ([place]) Feb. 2015   Right  great    . Gastric ulcer    EGD 2014  . Gout   . Hypercholesteremia   . Hypertension   . Stroke ([place])   . 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 Thrombosis of renal dialysis arteriovenous graft [facility])     Past Surgical History:  Procedure Laterality Date  . A/V FISTULAGRAM Left 05/28/2016   Procedure: A/V Fistulagram;  Surgeon: Katha Cabal, MD;  Location: [place] CV LAB;  Service: Cardiovascular;  Laterality: Left;  . A/V FISTULAGRAM Left 10/28/2016   Procedure: A/V Fistulagram;  Surgeon: Algernon Huxley, MD;  Location: [place] CV LAB;  Service: Cardiovascular;  Laterality: Left;  . A/V FISTULAGRAM Left 03/03/2017   Procedure: A/V FISTULAGRAM;  Surgeon: Algernon Huxley, MD;  Location: [place] CV LAB;  Service: Cardiovascular;  Laterality: Left;  . A/V SHUNT INTERVENTION N/A 05/28/2016   Procedure: A/V Shunt Intervention;  Surgeon: Katha Cabal, MD;  Location: [place] CV LAB;  Service: Cardiovascular;  Laterality: N/A;  . A/V SHUNT INTERVENTION N/A 03/03/2017   Procedure: A/V SHUNT INTERVENTION;  Surgeon: Algernon Huxley, MD;  Location: [place] CV LAB;  Service: Cardiovascular;  Laterality: N/A;  . AMPUTATION Right 04/10/2013   Procedure: Right Below Knee Amputation;  Surgeon: Beverely Low  Fernanda Drum, MD;  Location: [place];  Service: Orthopedics;  Laterality: Right;  Right Below Knee Amputation  . APPENDECTOMY    . BELOW KNEE LEG AMPUTATION Bilateral   . CHOLECYSTECTOMY    . CORONARY ARTERY BYPASS GRAFT  1996   [place]  . ESOPHAGOGASTRODUODENOSCOPY Left 05/24/2012   IFO:YDXAJOIN dilated baggy but otherwise a normal/ Small hiatal hernia. Gastric ulcer -S/P biopsy  . LEFT HEART CATH AND CORS/GRAFTS ANGIOGRAPHY N/A 02/12/2017   Procedure: LEFT HEART CATH AND CORS/GRAFTS ANGIOGRAPHY;  Surgeon: Lorretta Harp, MD;  Location: [place] CV  LAB;  Service: Cardiovascular;  Laterality: N/A;  . PERIPHERAL VASCULAR CATHETERIZATION N/A 08/09/2014   Procedure: A/V Shuntogram/Fistulagram;  Surgeon: Katha Cabal, MD;  Location: [place] CV LAB;  Service: Cardiovascular;  Laterality: N/A;  . PERIPHERAL VASCULAR CATHETERIZATION Left 08/09/2014   Procedure: A/V Shunt Intervention;  Surgeon: Katha Cabal, MD;  Location: [place] CV LAB;  Service: Cardiovascular;  Laterality: Left;     Inpatient Medications: Scheduled Meds:  Continuous Infusions:  PRN Meds: nitroGLYCERIN  Allergies:    Allergies  Allergen Reactions  . Codeine Itching and Nausea And Vomiting  . [place] Venom [Bee Venom] Swelling    Social History:   Social History   Socioeconomic History  . Marital status: Married    Spouse name: Not on file  . Number of children: Not on file  . Years of education: Not on file  . Highest education level: Not on file  Occupational History  . Not on file  Social Needs  . Financial resource strain: Not on file  . Food insecurity:    Worry: Not on file    Inability: Not on file  . Transportation needs:    Medical: Not on file    Non-medical: Not on file  Tobacco Use  . Smoking status: Former Smoker    Last attempt to quit: 08/08/2004    Years since quitting: 13.4  . Smokeless tobacco: Never Used  Substance and Sexual Activity  . Alcohol use: No  . Drug use: No  . Sexual activity: Not Currently  Lifestyle  . Physical activity:    Days per week: Not on file    Minutes per session: Not on file  . Stress: Not on file  Relationships  . Social connections:    Talks on phone: Not on file    Gets together: Not on file    Attends religious service: Not on file    Active member of club or organization: Not on file    Attends meetings of clubs or organizations: Not on file    Relationship status: Not on file  . Intimate partner violence:    Fear of current or ex partner: Not on file     Emotionally abused: Not on file    Physically abused: Not on file    Forced sexual activity: Not on file  Other Topics Concern  . Not on file  Social History Narrative  . Not on file    Family History:    Family History  Problem Relation Age of Onset  . Cancer Mother   . Colon cancer Neg Hx      ROS:  Please see the history of present illness.  All other ROS reviewed and negative.     Physical Exam/Data:   Vitals:   01/07/18 1400 01/07/18 1430 01/07/18 1500 01/07/18 1530  BP: 124/65 (!) 141/69 115/67 (!) 142/71  Pulse:  Resp: 14 15 14 18   Temp:      TempSrc:      SpO2: 100% 100% 99% 100%  Weight:      Height:       No intake or output data in the 24 hours ending 01/07/18 1648 Filed Weights   01/07/18 1259  Weight: 67 kg   Body mass index is 28.85 kg/m.  General:  Well nourished, well developed, in no acute distress HEENT: normal Lymph: no adenopathy Neck: no JVD Endocrine:  No thryomegaly Vascular: No carotid bruits; FA pulses 2+ bilaterally without bruits  Cardiac:  normal S1, S2; RRR; no murmur  Lungs:  clear to auscultation bilaterally, no wheezing, rhonchi or rales  Abd: soft, nontender, no hepatomegaly  Ext: no edema Skin: warm and dry  Neuro:  CNs 2-12 intact, no focal abnormalities noted Psych:  Normal affect     Laboratory Data:  Chemistry Recent Labs  Lab 01/07/18 1305  NA 132*  K 3.7  CL 97*  CO2 27  GLUCOSE 190*  BUN 37*  CREATININE 3.20*  CALCIUM 7.8*  GFRNONAA 17*  GFRAA 20*  ANIONGAP 8    No results for input(s): PROT, ALBUMIN, AST, ALT, ALKPHOS, BILITOT in the last 168 hours. Hematology Recent Labs  Lab 01/07/18 1305  WBC 7.6  RBC 3.53*  HGB 11.0*  HCT 34.6*  MCV 98.0  MCH 31.2  MCHC 31.8  RDW 12.9  PLT 171   Cardiac Enzymes Recent Labs  Lab 01/07/18 1305  TROPONINI <0.03    Recent Labs  Lab 01/07/18 1310  TROPIPOC 0.02    BNPNo results for input(s): BNP, PROBNP in the last 168 hours.  DDimer No  results for input(s): DDIMER in the last 168 hours.  Radiology/Studies:  Dg Chest 2 View  Result Date: 01/07/2018 CLINICAL DATA:  Dialysis patient. Non radiating left chest pain for 5 days. EXAM: CHEST - 2 VIEW COMPARISON:  08/29/2017 and 05/12/2017 radiographs. FINDINGS: Lordotic positioning and suboptimal inspiration on the frontal examination. The heart size and mediastinal contours are stable status post cardiac surgery. There is aortic tortuosity. There is increased elevation of the left hemidiaphragm with mildly increased left basilar atelectasis. No confluent airspace opacity, pleural effusion or pneumothorax identified. Glenohumeral degenerative changes and evidence of chronic rotator cuff tears are again noted bilaterally. IMPRESSION: Lower lung volumes with mildly increased left basilar atelectasis. No edema or confluent airspace opacity. Electronically Signed   By: Richardean Sale M.D.   On: 01/07/2018 13:49    Assessment and Plan:   1. CAD/Chest pain - long history of chest pains, particularly during HD.  - cath 01/2017 as reported above. In absence of convincing objective evidence of ischemia would not plan for repeat ischemic testing - follow enzymes and EKG overnight, no repeat echo just had one few days ago at [facility] [place].  - if negative ACS workup overnight would plan on adjusting antianginals      For questions or updates, please contact [place] Please consult [email or web address] for contact info under     Signed, Carlyle Dolly, MD  01/07/2018 4:48 PM

## 2018-01-07 NOTE — H&P (Addendum)
History and Physical    Tel Tolen NLG:[id number] DOB: Mar 08, 1940 DOA: 01/07/2018  PCP: [facility]   Patient coming from: Home  Chief Complaint: Chest pain  HPI: Alan [name] is a 77 y.o. male with medical history significant for CAD with CABG, ESRD, Bilateral below-knee amputations, PAD, who presented to the ED with complaints of left sided, nonradiating, chest pain of 5 days duration.  Chest pain mostly present all the time but varies in intensity.  Chest pain is unrelated to activity.  Described as "blow to chest".  Patient denies associated nausea, vomiting, dizziness, shortness of breath or palpitations.  No cough or fevers.  Patient was brought in by EMS from dialysis, was given 4 tablets of aspirin in route.  Recent Admission [place] from [facility] ED, 11/12- 11/16- for chest pain. Per D/c summary- troponin of 0.15 > 0.034, and t wave inversions in V1 and V2 (no prior EKG for comparison). Thought to be demand ischemia with dialysis and associated hypotension due to underlying CAD. It was determined that the risk of undergoing LHC outweighed the benefits and that procedure would most likely not improve QOL. We [facility] also refer patient to cardiac rehab but may have limited ability to participate due to bilateral BKA.  ED Course: Stable vitals.  I-STAT troponin negative.  EKG sinus rhythm nonspecific changes in aVL only otherwise no change from prior.  Two-view chest x-ray left base atelectasis no opacity or edema.  Unremarkable CBC, BMP.  Chest pain improvement with morphine and nitro.  Cardiologist consulted in ED. Hospitlaits to admit for chest pain rule out ACS  Review of Systems: As per HPI all other systems reviewed and negative  Past Medical History:  Diagnosis Date  . Arthritis   . Asthma   . Clotted renal dialysis arteriovenous graft ([place])   . Coronary artery disease    a. s/p CABG in 1996 with LIMA-LAD and SVG-OM1 b. 01/2017: cath  showing occluded SVG-OM1 and atretic LIMA-LAD. Diffuse disease along 1st Mrg and PDA with medical therapy recommended.   . CVA (cerebral infarction)   . DDD (degenerative disc disease), cervical   . DDD (degenerative disc disease), lumbar   . Depression   . Diabetes mellitus   . ESRD (end stage renal disease) on dialysis ([place])   . Gangrene of toe ([place]) Feb. 2015   Right  great    . Gastric ulcer    EGD 2014  . Gout   . Hypercholesteremia   . Hypertension   . Stroke ([place])   . 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 Thrombosis of renal dialysis arteriovenous graft [facility])     Past Surgical History:  Procedure Laterality Date  . A/V FISTULAGRAM Left 05/28/2016   Procedure: A/V Fistulagram;  Surgeon: Katha Cabal, MD;  Location: [place] CV LAB;  Service: Cardiovascular;  Laterality: Left;  . A/V FISTULAGRAM Left 10/28/2016   Procedure: A/V Fistulagram;  Surgeon: Algernon Huxley, MD;  Location: [place] CV LAB;  Service: Cardiovascular;  Laterality: Left;  . A/V FISTULAGRAM Left 03/03/2017   Procedure: A/V FISTULAGRAM;  Surgeon: Algernon Huxley, MD;  Location: [place] CV LAB;  Service: Cardiovascular;  Laterality: Left;  . A/V SHUNT INTERVENTION N/A 05/28/2016   Procedure: A/V Shunt Intervention;  Surgeon: Katha Cabal, MD;  Location: [place] [facility] LAB;  Service: Cardiovascular;  Laterality: N/A;  . A/V SHUNT INTERVENTION N/A 03/03/2017   Procedure: A/V SHUNT INTERVENTION;  Surgeon: Algernon Huxley,  MD;  Location: [place] [facility] LAB;  Service: Cardiovascular;  Laterality: N/A;  . AMPUTATION Right 04/10/2013   Procedure: Right Below Knee Amputation;  Surgeon: Newt Minion, MD;  Location: [place];  Service: Orthopedics;  Laterality: Right;  Right Below Knee Amputation  . APPENDECTOMY    . BELOW KNEE LEG AMPUTATION Bilateral   . CHOLECYSTECTOMY    . CORONARY ARTERY BYPASS GRAFT  1996   [place]  . ESOPHAGOGASTRODUODENOSCOPY Left 05/24/2012   VQQ:[id number] dilated baggy but  otherwise a normal/ Small hiatal hernia. Gastric ulcer -S/P biopsy  . LEFT HEART CATH AND CORS/GRAFTS ANGIOGRAPHY N/A 02/12/2017   Procedure: LEFT HEART CATH AND CORS/GRAFTS ANGIOGRAPHY;  Surgeon: Lorretta Harp, MD;  Location: [place] CV LAB;  Service: Cardiovascular;  Laterality: N/A;  . PERIPHERAL VASCULAR CATHETERIZATION N/A 08/09/2014   Procedure: A/V Shuntogram/Fistulagram;  Surgeon: Katha Cabal, MD;  Location: [place] CV LAB;  Service: Cardiovascular;  Laterality: N/A;  . PERIPHERAL VASCULAR CATHETERIZATION Left 08/09/2014   Procedure: A/V Shunt Intervention;  Surgeon: Katha Cabal, MD;  Location: [place] CV LAB;  Service: Cardiovascular;  Laterality: Left;     reports that he quit smoking about 13 years ago. He has never used smokeless tobacco. He reports that he does not drink alcohol or use drugs.  Allergies  Allergen Reactions  . Codeine Itching and Nausea And Vomiting  . Yellow Jack[place] Venom [Bee Venom] Swelling    Family History  Problem Relation Age of Onset  . Cancer Mother   . Colon cancer Neg Hx     Prior to Admission medications   Medication Sig Start Date End Date Taking? Authorizing Provider  acetaminophen (TYLENOL) 500 MG tablet Take 1,000 mg by mouth at bedtime. *May take daily if needed but takes mostly at bedtime   Yes [provider]  aspirin EC 81 MG tablet Take 81 mg by mouth daily.    Yes [provider]  atorvastatin (LIPITOR) 20 MG tablet Take 20 mg by mouth at bedtime.  01/02/18  Yes [provider]  B Complex-C-Folic Acid (NEPHRO-VITE PO) Take 1 tablet by mouth at bedtime.    Yes [provider]  cinacalcet (SENSIPAR) 30 MG tablet Take 30 mg by mouth at bedtime.    Yes [provider]  Coenzyme Q10 (COQ10) 100 MG CAPS Take 100 mg by mouth at bedtime.   Yes [provider]  diphenhydrAMINE (BENADRYL) 25 mg capsule Take 25 mg by mouth at bedtime.    Yes [provider]  furosemide (LASIX) 20 MG tablet Take 20 mg by mouth daily.   Yes [provider]  isosorbide mononitrate (IMDUR) 30 MG 24 hr tablet Take 0.5 tablets (15 mg total) by mouth daily. 02/14/17  Yes Lavina Hamman, MD  LANTUS SOLOSTAR 100 UNIT/ML Solostar Pen Inject 9 Units into the skin at bedtime.  12/21/13  Yes [provider]  lisinopril (PRINIVIL,ZESTRIL) 5 MG tablet Take 5 mg by mouth daily.   Yes [provider]  metoprolol succinate (TOPROL XL) 25 MG 24 hr tablet Take 0.5 tablets (12.5 mg total) by mouth daily. Patient taking differently: Take 12.5 mg by mouth every Monday, Wednesday, and Friday. In the afternoon after dialysis. 02/13/17 02/13/18 Yes Lavina Hamman, MD  omega-3 acid ethyl esters (LOVAZA) 1 g capsule Take 1 g by mouth daily.   Yes [provider]  omeprazole (PRILOSEC) 20 MG capsule Take 1 capsule by mouth 2 (two) times daily. 03/20/17  Yes [provider]  Amino Acid Infusion (PROSOL) 20 % SOLN Inject 1 vial into the vein once a week. 02/28/17   [provider]  amLODipine (NORVASC) 5 MG tablet Take 5 mg by mouth daily.    [provider]  budesonide-formoterol (SYMBICORT) 80-4.5 MCG/ACT inhaler Inhale 2 puffs into the lungs 2 (two) times daily as needed (for wheezing/shortness of breath.).    [provider]  Liniments (SALONPAS PAIN RELIEF PATCH EX) Place 1 patch onto the skin daily as needed (for pain relief.).     [provider]  nitroGLYCERIN (NITROSTAT) 0.4 MG SL tablet Place 1 tablet (0.4 mg total) under the tongue every 5 (five) minutes as needed for chest pain. 11/18/14   Samuella Cota, MD    Physical Exam: Vitals:   01/07/18 1400 01/07/18 1430 01/07/18 1500 01/07/18 1530  BP: 124/65 (!) 141/69 115/67 (!) 142/71  Pulse:      Resp: 14 15 14 18   Temp:      TempSrc:      SpO2: 100% 100% 99% 100%  Weight:      Height:        Constitutional: NAD, calm, comfortable Vitals:    01/07/18 1400 01/07/18 1430 01/07/18 1500 01/07/18 1530  BP: 124/65 (!) 141/69 115/67 (!) 142/71  Pulse:      Resp: 14 15 14 18   Temp:      TempSrc:      SpO2: 100% 100% 99% 100%  Weight:      Height:       Eyes: PERRL, lids and conjunctivae normal ENMT: Mucous membranes are moist. Posterior pharynx clear of any exudate or lesions. Neck: normal, supple, no masses, no thyromegaly Respiratory: clear to auscultation bilaterally, no wheezing, no crackles. Normal respiratory effort. No accessory muscle use.  Cardiovascular: Regular rate and rhythm, no murmurs / rubs / gallops.  Abdomen: no tenderness, no masses palpated. No hepatosplenomegaly. Bowel sounds positive.  Musculoskeletal: Bilat BKA, Good ROM, no contractures.  Skin: no rashes, lesions, ulcers. No induration Neurologic: CN 2-12 grossly intact.  Extremities spontaneously Psychiatric: Normal judgment and insight. Alert and oriented x 3. Normal mood.   Labs on Admission: I have personally reviewed following labs and imaging studies  CBC: Recent Labs  Lab 01/07/18 1305  WBC 7.6  HGB 11.0*  HCT 34.6*  MCV 98.0  PLT 197   Basic Metabolic Panel: Recent Labs  Lab 01/07/18 1305  NA 132*  K 3.7  CL 97*  CO2 27  GLUCOSE 190*  BUN 37*  CREATININE 3.20*  CALCIUM 7.8*   Cardiac Enzymes: Recent Labs  Lab 01/07/18 1305  TROPONINI <0.03    Radiological Exams on Admission: Dg Chest 2 View  Result Date: 01/07/2018 CLINICAL DATA:  Dialysis patient. Non radiating left chest pain for 5 days. EXAM: CHEST - 2 VIEW COMPARISON:  08/29/2017 and 05/12/2017 radiographs. FINDINGS: Lordotic positioning and suboptimal inspiration on the frontal examination. The heart size and mediastinal contours are stable status post cardiac surgery. There is aortic tortuosity. There is increased elevation of the left hemidiaphragm with mildly increased left basilar atelectasis. No confluent airspace opacity, pleural effusion or pneumothorax  identified. Glenohumeral degenerative changes and evidence of chronic rotator cuff tears are again noted bilaterally. IMPRESSION: Lower lung volumes with mildly increased left basilar atelectasis. No edema or confluent airspace opacity. Electronically Signed   By: Richardean Sale M.D.   On: 01/07/2018 13:49    EKG: Independently reviewed. Normal sinus rhythm, nonspecific T wave  changes in aVL only otherwise no change from prior.  QTC 441   Assessment/Plan Principal Problem:   Chest pain Active Problems:   CAD (coronary artery disease) with CABG   ESRD on dialysis ([place])   S/P bilateral BKA (below knee amputation) ([place])   Essential hypertension   Chest pain- remote Hx of CABG. unremarkable i-STAT troponin and EKG without significant change from prior.  Last cardiac cath 01/2017-occluded grafts, and diffusely diseased first marginal branch.  Plan was for medical treatment.  Aspirin 325 given. -Troponin x3 -EKG a.m. -Increase Lipitor to 80 mg daily -IV Morphine and nitro PRN -Cardiology recs appreciated-recent echo, no need for repeat, follow enzymes if negative ACS work-up overnight, [place] plan on adjusting antianginals. -Continue home metoprolol  12.5 daily - NPO midnight. Addendum- Paged at 8.05pm, that patient had an extended run of V. Tach, lasting >61min. EKG- now sinus rhythm rate 77. Patient asymptomatic. Mag ordered. Trops still negative. Stable vitals.  ESRD-surgery Monday Wednesday Friday.  Brought to the ED 1 hour before completing full session. -Nephrology consultation tomorrow, if patient is still admitted  HTN-stable -New home metoprolol, lisinopril.   DM-glucose 190.  Patient not taking Lantus. - Hgba1c     DVT prophylaxis: heparin Code Status: Full Family Communication: None at bedside Disposition Plan: 1-2 days Consults called: Cardiology Admission status:  Obs, tele   Bethena Roys MD [facility] Pager [phone] From 3PM-11PM.  Otherwise  please contact night-coverage [email or web address] Password [facility]  01/07/2018, 4:34 PM

## 2018-01-07 NOTE — ED Triage Notes (Signed)
[facility] called to Dialysis for chest pain, pt rcvd 2 hrs with 1.5 left, v/s stable, NSR on monitor, pt c/o left non-radiating chest pain x 5 days, denies SOB

## 2018-01-07 NOTE — ED Provider Notes (Signed)
[facility] EMERGENCY DEPARTMENT Provider Note   CSN: [id number] Arrival date & time: 01/07/18  1253     History   Chief Complaint Chief Complaint  Patient presents with  . Chest Pain    HPI Alan [name] is a 77 y.o. male.  Patient complains of chest discomfort today while he was getting dialysis.  He has a history of coronary disease that he states the pain was severe and  The history is provided by the patient. No language interpreter was used.  Chest Pain   This is a new problem. The current episode started 2 days ago. The problem occurs constantly. The problem has not changed since onset.The pain is associated with rest. The pain is present in the substernal region. The pain is at a severity of 5/10. The pain is moderate. The quality of the pain is described as dull. The pain does not radiate. Exacerbated by: Unknown. Pertinent negatives include no abdominal pain, no back pain, no cough and no headaches.  Pertinent negatives for past medical history include no seizures.    Past Medical History:  Diagnosis Date  . Arthritis   . Asthma   . Clotted renal dialysis arteriovenous graft ([place])   . Coronary artery disease    a. s/p CABG in 1996 with LIMA-LAD and SVG-OM1 b. 01/2017: cath showing occluded SVG-OM1 and atretic LIMA-LAD. Diffuse disease along 1st Mrg and PDA with medical therapy recommended.   . CVA (cerebral infarction)   . DDD (degenerative disc disease), cervical   . DDD (degenerative disc disease), lumbar   . Depression   . Diabetes mellitus   . 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 Gangrene of toe ([place]) Feb. 2015   Right  great    . Gastric ulcer    EGD 2014  . Gout   . Hypercholesteremia   . Hypertension   . Stroke ([place])   . 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 Thrombosis of renal dialysis arteriovenous graft [facility])     Patient Active Problem List   Diagnosis Date Noted  . Left sided numbness 02/10/2017  . Hypertensive urgency  02/10/2017  . Hypokalemia 02/10/2017  . Chest pain 06/15/2016  . Renal dialysis device, implant, or graft complication 62/13/0865  . Septic shock ([place]) 08/28/2015  . 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 Lower urinary tract infectious disease   . Essential hypertension 08/13/2015  . Nausea and vomiting 08/13/2015  . ESRD (end stage renal disease) ([place]) 04/12/2015  . Gastroenteritis 04/12/2015  . 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 HLD (hyperlipidemia) 04/12/2015  . Acute chest pain 11/16/2014  . 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 Sepsis ([place]) 04/13/2013  . Gram-negative bacteremia 04/12/2013  . ESRD on dialysis ([place]) 04/09/2013  . 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 Protein-calorie malnutrition, severe ([place]) 04/09/2013  . Gangrene of toe ([place]) 04/08/2013  . Toe gangrene ([place]) 04/08/2013  . Nausea with vomiting 05/22/2012  . S/P BKA (below knee amputation) unilateral, left 05/20/2012  . Musculoskeletal chest pain 05/20/2012  . Acute renal failure ([place]) 05/19/2012  . CAD (coronary artery disease) 05/19/2012  . 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 Hyperkalemia 05/19/2012    Past Surgical History:  Procedure Laterality Date  . A/V FISTULAGRAM Left 05/28/2016   Procedure: A/V Fistulagram;  Surgeon: Katha Cabal, MD;  Location: [place] [facility];  Service: Cardiovascular;  Laterality: Left;  . A/V FISTULAGRAM  Left 10/28/2016   Procedure: A/V Fistulagram;  Surgeon: Algernon Huxley, MD;  Location: [place] [facility];  Service: Cardiovascular;  Laterality: Left;  . A/V FISTULAGRAM Left 03/03/2017   Procedure: A/V FISTULAGRAM;  Surgeon: Algernon Huxley, MD;  Location: [place] CV LAB;  Service: Cardiovascular;  Laterality: Left;  . A/V SHUNT INTERVENTION N/A 05/28/2016   Procedure: A/V Shunt Intervention;  Surgeon: Katha Cabal, MD;  Location: [place] CV LAB;   Service: Cardiovascular;  Laterality: N/A;  . A/V SHUNT INTERVENTION N/A 03/03/2017   Procedure: A/V SHUNT INTERVENTION;  Surgeon: Algernon Huxley, MD;  Location: [place] [facility];  Service: Cardiovascular;  Laterality: N/A;  . AMPUTATION Right 04/10/2013   Procedure: Right Below Knee Amputation;  Surgeon: Newt Minion, MD;  Location: [place];  Service: Orthopedics;  Laterality: Right;  Right Below Knee Amputation  . APPENDECTOMY    . BELOW KNEE LEG AMPUTATION Bilateral   . CHOLECYSTECTOMY    . CORONARY ARTERY BYPASS GRAFT  1996   [place]  . ESOPHAGOGASTRODUODENOSCOPY Left 05/24/2012   HQI:ONGEXBMW dilated baggy but otherwise a normal/ Small hiatal hernia. Gastric ulcer -S/P biopsy  . LEFT HEART CATH AND CORS/GRAFTS ANGIOGRAPHY N/A 02/12/2017   Procedure: LEFT HEART CATH AND CORS/GRAFTS ANGIOGRAPHY;  Surgeon: Lorretta Harp, MD;  Location: [place] CV LAB;  Service: Cardiovascular;  Laterality: N/A;  . PERIPHERAL VASCULAR CATHETERIZATION N/A 08/09/2014   Procedure: A/V Shuntogram/Fistulagram;  Surgeon: Katha Cabal, MD;  Location: [place] CV LAB;  Service: Cardiovascular;  Laterality: N/A;  . PERIPHERAL VASCULAR CATHETERIZATION Left 08/09/2014   Procedure: A/V Shunt Intervention;  Surgeon: Katha Cabal, MD;  Location: [place] [facility] LAB;  Service: Cardiovascular;  Laterality: Left;        Home Medications    Prior to Admission medications   Medication Sig Start Date End Date Taking? Authorizing Provider  acetaminophen (TYLENOL) 500 MG tablet Take 1,000 mg by mouth at bedtime. *May take daily if needed but takes mostly at bedtime   Yes [provider]  aspirin EC 81 MG tablet Take 81 mg by mouth daily.    Yes [provider]  atorvastatin (LIPITOR) 20 MG tablet Take 20 mg by mouth at bedtime.  01/02/18  Yes [provider]  B Complex-C-Folic Acid (NEPHRO-VITE PO) Take 1 tablet by mouth at bedtime.    Yes [provider]  cinacalcet  (SENSIPAR) 30 MG tablet Take 30 mg by mouth at bedtime.    Yes [provider]  Coenzyme Q10 (COQ10) 100 MG CAPS Take 100 mg by mouth at bedtime.   Yes [provider]  diphenhydrAMINE (BENADRYL) 25 mg capsule Take 25 mg by mouth at bedtime.    Yes [provider]  furosemide (LASIX) 20 MG tablet Take 20 mg by mouth daily.   Yes [provider]  isosorbide mononitrate (IMDUR) 30 MG 24 hr tablet Take 0.5 tablets (15 mg total) by mouth daily. 02/14/17  Yes Lavina Hamman, MD  LANTUS SOLOSTAR 100 UNIT/ML Solostar Pen Inject 9 Units into the skin at bedtime.  12/21/13  Yes [provider]  lisinopril (PRINIVIL,ZESTRIL) 5 MG tablet Take 5 mg by mouth daily.   Yes [provider]  metoprolol succinate (TOPROL XL) 25 MG 24 hr tablet Take 0.5 tablets (12.5 mg total) by mouth daily. Patient taking differently: Take 12.5 mg by mouth every Monday, Wednesday, and Friday. In the afternoon after dialysis. 02/13/17 02/13/18 Yes Lavina Hamman,  MD  omega-3 acid ethyl esters (LOVAZA) 1 g capsule Take 1 g by mouth daily.   Yes [provider]  omeprazole (PRILOSEC) 20 MG capsule Take 1 capsule by mouth 2 (two) times daily. 03/20/17  Yes [provider]  Amino Acid Infusion (PROSOL) 20 % SOLN Inject 1 vial into the vein once a week. 02/28/17   [provider]  amLODipine (NORVASC) 5 MG tablet Take 5 mg by mouth daily.    [provider]  budesonide-formoterol (SYMBICORT) 80-4.5 MCG/ACT inhaler Inhale 2 puffs into the lungs 2 (two) times daily as needed (for wheezing/shortness of breath.).    [provider]  Liniments (SALONPAS PAIN RELIEF PATCH EX) Place 1 patch onto the skin daily as needed (for pain relief.).     [provider]  nitroGLYCERIN (NITROSTAT) 0.4 MG SL tablet Place 1 tablet (0.4 mg total) under the tongue every 5 (five) minutes as needed for chest pain. 11/18/14   Samuella Cota, MD    Family  History Family History  Problem Relation Age of Onset  . Cancer Mother   . Colon cancer Neg Hx     Social History Social History   Tobacco Use  . Smoking status: Former Smoker    Last attempt to quit: 08/08/2004    Years since quitting: 13.4  . Smokeless tobacco: Never Used  Substance Use Topics  . Alcohol use: No  . Drug use: No     Allergies   Codeine and Yellow jacket venom [bee venom]   Review of Systems Review of Systems  Constitutional: Negative for appetite change and fatigue.  HENT: Negative for congestion, ear discharge and sinus pressure.   Eyes: Negative for discharge.  Respiratory: Negative for cough.   Cardiovascular: Positive for chest pain.  Gastrointestinal: Negative for abdominal pain and diarrhea.  Genitourinary: Negative for frequency and hematuria.  Musculoskeletal: Negative for back pain.  Skin: Negative for rash.  Neurological: Negative for seizures and headaches.  Psychiatric/Behavioral: Negative for hallucinations.     Physical Exam Updated Vital Signs BP 115/67   Pulse 80   Temp (!) 97.5 F (36.4 C) (Oral)   Resp 14   Ht 5' (1.524 m)   Wt 67 kg   SpO2 99%   BMI 28.85 kg/m   Physical Exam  Constitutional: He is oriented to person, place, and time. He appears well-developed.  HENT:  Head: Normocephalic.  Eyes: Conjunctivae and EOM are normal. No scleral icterus.  Neck: Neck supple. No thyromegaly present.  Cardiovascular: Normal rate and regular rhythm. Exam reveals no gallop and no friction rub.  No murmur heard. Pulmonary/Chest: No stridor. He has no wheezes. He has no rales. He exhibits no tenderness.  Abdominal: He exhibits no distension. There is no tenderness. There is no rebound.  Musculoskeletal: Normal range of motion. He exhibits no edema.  Lymphadenopathy:    He has no cervical adenopathy.  Neurological: He is oriented to person, place, and time. He exhibits normal muscle tone. Coordination normal.  Skin: No rash  noted. No erythema.  Psychiatric: He has a normal mood and affect. His behavior is normal.     ED Treatments / Results  Labs (all labs ordered are listed, but only abnormal results are displayed) Labs Reviewed  BASIC METABOLIC PANEL - Abnormal; Notable for the following components:      Result Value   Sodium 132 (*)    Chloride 97 (*)    Glucose, Bld 190 (*)    BUN  37 (*)    Creatinine, Ser 3.20 (*)    Calcium 7.8 (*)    GFR calc non Af Amer 17 (*)    GFR calc Af Amer 20 (*)    All other components within normal limits  CBC - Abnormal; Notable for the following components:   RBC 3.53 (*)    Hemoglobin 11.0 (*)    HCT 34.6 (*)    All other components within normal limits  TROPONIN I  TROPONIN I  TROPONIN I  I-STAT TROPONIN, ED    EKG EKG Interpretation  Date/Time:  Wednesday January 07 2018 12:59:34 EST Ventricular Rate:  83 PR Interval:  166 QRS Duration: 82 QT Interval:  376 QTC Calculation: 441 R Axis:   -7 Text Interpretation:  Normal sinus rhythm Normal ECG Confirmed by Milton Ferguson [phone]) on 01/07/2018 1:13:30 PM   Radiology Dg Chest 2 View  Result Date: 01/07/2018 CLINICAL DATA:  Dialysis patient. Non radiating left chest pain for 5 days. EXAM: CHEST - 2 VIEW COMPARISON:  08/29/2017 and 05/12/2017 radiographs. FINDINGS: Lordotic positioning and suboptimal inspiration on the frontal examination. The heart size and mediastinal contours are stable status post cardiac surgery. There is aortic tortuosity. There is increased elevation of the left hemidiaphragm with mildly increased left basilar atelectasis. No confluent airspace opacity, pleural effusion or pneumothorax identified. Glenohumeral degenerative changes and evidence of chronic rotator cuff tears are again noted bilaterally. IMPRESSION: Lower lung volumes with mildly increased left basilar atelectasis. No edema or confluent airspace opacity. Electronically Signed   By: Richardean Sale M.D.   On:  01/07/2018 13:49    Procedures Procedures (including critical care time)  Medications Ordered in ED Medications  nitroGLYCERIN (NITROSTAT) SL tablet 0.4 mg (0.4 mg Sublingual Given 01/07/18 1438)  morphine 2 MG/ML injection 2 mg (2 mg Intravenous Given 01/07/18 1322)     Initial Impression / Assessment and Plan / ED Course  I have reviewed the triage vital signs and the nursing notes.  Pertinent labs & imaging results that were available during my care of the patient were reviewed by me and considered in my medical decision making (see chart for details).   Patient with history of coronary artery disease.  Patient with chest pain pretty significant today.  Troponin is normal.  Patient [place] be admitted for chest pain rule out and cardiology [place] consult    Final Clinical Impressions(s) / ED Diagnoses   Final diagnoses:  Unstable angina pectoris [facility])    ED Discharge Orders    None       Milton Ferguson, MD 01/07/18 [phone]

## 2018-01-07 NOTE — Progress Notes (Signed)
Has denied chest pain since admission. Vitals stable.  Wife called and said that patient was admitted to [facility] from [facility] ED last week for same problem.  She stated the only reason that he came to [facility] with this episode was that he was at [facility] in [place] today and when he developed chest pain there EMS brought him here.

## 2018-01-08 DIAGNOSIS — I10 Essential (primary) hypertension: Secondary | ICD-10-CM

## 2018-01-08 DIAGNOSIS — Z89511 Acquired absence of right leg below knee: Secondary | ICD-10-CM

## 2018-01-08 DIAGNOSIS — Z992 Dependence on renal dialysis: Secondary | ICD-10-CM

## 2018-01-08 DIAGNOSIS — N186 End stage renal disease: Secondary | ICD-10-CM

## 2018-01-08 DIAGNOSIS — Z89512 Acquired absence of left leg below knee: Secondary | ICD-10-CM

## 2018-01-08 DIAGNOSIS — R079 Chest pain, unspecified: Secondary | ICD-10-CM | POA: Diagnosis not present

## 2018-01-08 LAB — GLUCOSE, CAPILLARY
Glucose-Capillary: 125 mg/dL — ABNORMAL HIGH (ref 70–99)
Glucose-Capillary: 88 mg/dL (ref 70–99)

## 2018-01-08 LAB — MRSA PCR SCREENING: MRSA by PCR: NEGATIVE

## 2018-01-08 LAB — TROPONIN I: Troponin I: <0.03 ng/mL (ref <0.03)

## 2018-01-08 MED ORDER — EPOETIN ALFA 2000 UNIT/ML IJ SOLN: 2000.0000 [IU] | INTRAMUSCULAR | Status: DC

## 2018-01-08 MED ORDER — ISOSORBIDE MONONITRATE ER 30 MG PO TB24: 15.0000 mg | ORAL_TABLET | Freq: Two times a day (BID) | ORAL | Status: DC

## 2018-01-08 MED ORDER — SODIUM CHLORIDE 0.9 % IV SOLN
INTRAVENOUS | Status: DC | PRN
  Administered 2018-01-08: 1000 mL via INTRAVENOUS

## 2018-01-08 MED ORDER — CHLORHEXIDINE GLUCONATE CLOTH 2 % EX PADS
6.0000 | MEDICATED_PAD | Freq: Every day | CUTANEOUS | Status: DC
  Administered 2018-01-08: 6 via TOPICAL

## 2018-01-08 MED ORDER — MAGNESIUM SULFATE IN D5W 1-5 GM/100ML-% IV SOLN
1.0000 g | Freq: Once | INTRAVENOUS | Status: AC
  Administered 2018-01-08: 1 g via INTRAVENOUS
  Filled 2018-01-08 (×2): qty 100

## 2018-01-08 MED ORDER — METOPROLOL TARTRATE 5 MG/5ML IV SOLN
5.0000 mg | Freq: Once | INTRAVENOUS | Status: AC
  Administered 2018-01-08: 5 mg via INTRAVENOUS
  Filled 2018-01-08: qty 5

## 2018-01-08 MED ORDER — ISOSORBIDE MONONITRATE ER 30 MG PO TB24: 15.0000 mg | ORAL_TABLET | Freq: Two times a day (BID) | ORAL | 0 refills | Status: DC

## 2018-01-08 NOTE — Progress Notes (Signed)
Telemetry data obtained from yesterday and reviewed in detail. No NSVT or VT, only severe artifact. The native rhythm is noted to march throughout the episodes of artifact.     Carlyle Dolly MD

## 2018-01-08 NOTE — Progress Notes (Signed)
Pt discharged from facility with stable vital signs and no complaints of pain. Discharge instructions were given to patient and went over. He verbalized understanding.   Jeris Penta, RN

## 2018-01-08 NOTE — Progress Notes (Addendum)
Progress Note  Patient Name: Alan [name] Date of Encounter: 01/08/2018  Primary Cardiologist: Dr Harl Bowie  Subjective   No complaints. Transferred to stepdown due to wide complex tachycardia last night, asymptomatic.   Inpatient Medications    Scheduled Meds: . aspirin EC  81 mg Oral Daily  . atorvastatin  80 mg Oral QHS  . cinacalcet  30 mg Oral QHS  . heparin  5,000 Units Subcutaneous Q8H  . insulin aspart  0-9 Units Subcutaneous TID WC  . isosorbide mononitrate  15 mg Oral Daily  . lisinopril  5 mg Oral Daily  . metoprolol succinate  12.5 mg Oral Q1500  . mometasone-formoterol  2 puff Inhalation BID  . pantoprazole  40 mg Oral Daily   Continuous Infusions: . sodium chloride 1,000 mL (01/08/18 0254)   PRN Meds: sodium chloride, acetaminophen **OR** acetaminophen, morphine injection, nitroGLYCERIN, ondansetron **OR** ondansetron (ZOFRAN) IV, polyethylene glycol   Vital Signs    Vitals:   01/08/18 0400 01/08/18 0500 01/08/18 0754 01/08/18 0813  BP: (!) 142/56 (!) 147/55  (!) 141/58  Pulse: (!) 54 (!) 53    Resp: 13 12    Temp: (!) 97.5 F (36.4 C)  97.6 F (36.4 C)   TempSrc: Oral  Oral   SpO2: 99% 100%    Weight:  65.2 kg    Height:        Intake/Output Summary (Last 24 hours) at 01/08/2018 0822 Last data filed at 01/08/2018 0254 Gross per 24 hour  Intake 97.82 ml  Output 200 ml  Net -102.18 ml   Filed Weights   01/07/18 1259 01/08/18 0218 01/08/18 0500  Weight: 67 kg 65.2 kg 65.2 kg    Telemetry    Limited available data, sinus brady - Personally Reviewed  ECG    na  Physical Exam   GEN: No acute distress.   Neck: No JVD Cardiac: RRR, no murmurs, rubs, or gallops.  Respiratory: Clear to auscultation bilaterally. GI: Soft, nontender, non-distended  MS: No edema; No deformity. Neuro:  Nonfocal  Psych: Normal affect   Labs    Chemistry Recent Labs  Lab 01/07/18 1305  NA 132*  K 3.7  CL 97*  CO2 27  GLUCOSE 190*  BUN 37*    CREATININE 3.20*  CALCIUM 7.8*  GFRNONAA 17*  GFRAA 20*  ANIONGAP 8     Hematology Recent Labs  Lab 01/07/18 1305  WBC 7.6  RBC 3.53*  HGB 11.0*  HCT 34.6*  MCV 98.0  MCH 31.2  MCHC 31.8  RDW 12.9  PLT 171    Cardiac Enzymes Recent Labs  Lab 01/07/18 1305 01/07/18 1845 01/08/18 0127  TROPONINI <0.03 <0.03 <0.03    Recent Labs  Lab 01/07/18 1310  TROPIPOC 0.02     BNPNo results for input(s): BNP, PROBNP in the last 168 hours.   DDimer No results for input(s): DDIMER in the last 168 hours.   Radiology    Dg Chest 2 View  Result Date: 01/07/2018 CLINICAL DATA:  Dialysis patient. Non radiating left chest pain for 5 days. EXAM: CHEST - 2 VIEW COMPARISON:  08/29/2017 and 05/12/2017 radiographs. FINDINGS: Lordotic positioning and suboptimal inspiration on the frontal examination. The heart size and mediastinal contours are stable status post cardiac surgery. There is aortic tortuosity. There is increased elevation of the left hemidiaphragm with mildly increased left basilar atelectasis. No confluent airspace opacity, pleural effusion or pneumothorax identified. Glenohumeral degenerative changes and evidence of chronic rotator cuff tears are again noted  bilaterally. IMPRESSION: Lower lung volumes with mildly increased left basilar atelectasis. No edema or confluent airspace opacity. Electronically Signed   By: Richardean Sale M.D.   On: 01/07/2018 13:49    Cardiac Studies    Patient Profile     Alan [name] is a 77 y.o. male with a hx of CAD who is being seen today for the evaluation of chest pain at the request of Dr Denton Brick.  Assessment & Plan    1. CAD/Chest pain - long history of chest pains, particularly during HD though more intense. Admitted to [facility] a [facility] symptoms.  - cath 01/2017 as reported above.  - follow enzymes and EKG overnight, no repeat echo just had one few days ago at [facility].  - if negative ACS workup overnight would  plan on adjusting antianginals. From notes including recent [facility] admission medical therapy has been limited by low bp's at times, particularly associated with dilaysis.   -trops negative, EKG without ischemic changes overnight. No objective evidence of ischemia.  - increase imdur to 15mg  bid, ok for discharge. Bid dosing due to issues with hypotension at times.   2. Telemetry artifact - there was concern about episodes of NSVT and VT for patient last night, he was transferred to stepdown - after initial delay in getting tele data due to technical issues, I did review these in detail and there is no true NSVT or VT, only artifact.  - no further workup is indicated.    3. Bradycardia - chronic, have limited beta blocker dosing.      For questions or updates, please contact [place] Please consult [email or web address] for contact info under        Signed, Carlyle Dolly, MD  01/08/2018, 8:22 AM

## 2018-01-08 NOTE — Progress Notes (Signed)
Pt has been running sinus brady since admitted to floor. Pt sleeping well and no complaints of chest pain

## 2018-01-08 NOTE — Discharge Summary (Signed)
Physician Discharge Summary  Alan [name] HAL:[id number] DOB: 1940/06/14 DOA: 01/07/2018  PCP: [facility] date: 01/07/2018 Discharge date: 01/08/2018  Admitted From: Home Disposition: Home  Recommendations for Outpatient Follow-up:  1. Follow up with PCP in 1-2 weeks 2. Please obtain BMP/CBC in one week  Discharge Condition: Stable CODE STATUS: Full code Diet recommendation: Heart healthy  Brief/Interim Summary: 77 year old male with a history of end-stage renal disease on hemodialysis, bilateral lower extremity amputations, coronary artery disease status post CABG, was undergoing his outpatient dialysis session when he began to have chest pain.  He had approximately 1 hour of dialysis left when treatment was stopped and he was sent to the emergency room for evaluation.  He recently had an admission to [facility] for chest pain.  Echocardiogram done at that time was unrevealing.  He was admitted to the hospital where he ruled out for ACS with negative cardiac markers.  He was followed by cardiology.  Overnight, it was felt that he may have developed recurrent prolonged episodes of nonsustained VT.  Upon further review by cardiology, it was felt that these findings are likely related to artifact.  No further cardiac work-up was recommended at this time.  His Imdur has been increased from 15 mg daily to 15 mg twice daily.  Further titration of beta-blocker is limited by bradycardia.  He is asked to follow-up with his primary cardiologist.  No further inpatient work-up planned.  He is otherwise stable for discharge.  Discharge Diagnoses:  Principal Problem:   Chest pain Active Problems:   CAD (coronary artery disease) with CABG   ESRD on dialysis ([place])   S/P bilateral BKA (below knee amputation) ([place])   Essential hypertension    Discharge Instructions  Discharge Instructions    Diet - low sodium heart healthy   Complete by:  As directed     Increase activity slowly   Complete by:  As directed      Allergies as of 01/08/2018      Reactions   Codeine Itching, Nausea And Vomiting   Yellow Jacket Venom [bee Venom] Swelling      Medication List    TAKE these medications   acetaminophen 500 MG tablet Commonly known as:  TYLENOL Take 1,000 mg by mouth at bedtime. *May take daily if needed but takes mostly at bedtime   amLODipine 5 MG tablet Commonly known as:  NORVASC Take 5 mg by mouth daily.   aspirin EC 81 MG tablet Take 81 mg by mouth daily.   atorvastatin 20 MG tablet Commonly known as:  LIPITOR Take 20 mg by mouth at bedtime.   budesonide-formoterol 80-4.5 MCG/ACT inhaler Commonly known as:  SYMBICORT Inhale 2 puffs into the lungs 2 (two) times daily as needed (for wheezing/shortness of breath.).   cinacalcet 30 MG tablet Commonly known as:  SENSIPAR Take 30 mg by mouth at bedtime.   CoQ10 100 MG Caps Take 100 mg by mouth at bedtime.   diphenhydrAMINE 25 mg capsule Commonly known as:  BENADRYL Take 25 mg by mouth at bedtime.   furosemide 20 MG tablet Commonly known as:  LASIX Take 20 mg by mouth daily.   isosorbide mononitrate 30 MG 24 hr tablet Commonly known as:  IMDUR Take 0.5 tablets (15 mg total) by mouth 2 (two) times daily. What changed:  when to take this   LANTUS SOLOSTAR 100 UNIT/ML Solostar Pen Generic drug:  Insulin Glargine Inject 9 Units into the skin  at bedtime.   lisinopril 5 MG tablet Commonly known as:  PRINIVIL,ZESTRIL Take 5 mg by mouth daily.   magnesium oxide 400 MG tablet Commonly known as:  MAG-OX Take by mouth.   metoCLOPramide 5 MG tablet Commonly known as:  REGLAN Take by mouth.   metoprolol succinate 25 MG 24 hr tablet Commonly known as:  TOPROL-XL Take 0.5 tablets (12.5 mg total) by mouth daily. What changed:    when to take this  additional instructions  Another medication with the same name was removed. Continue taking this medication, and follow  the directions you see here.   NEPHRO-VITE PO Take 1 tablet by mouth at bedtime.   nitroGLYCERIN 0.4 MG SL tablet Commonly known as:  NITROSTAT Place 1 tablet (0.4 mg total) under the tongue every 5 (five) minutes as needed for chest pain.   omega-3 acid ethyl esters 1 g capsule Commonly known as:  LOVAZA Take 1 g by mouth daily.   omeprazole 20 MG capsule Commonly known as:  PRILOSEC Take 1 capsule by mouth 2 (two) times daily.   PROSOL 20 % Soln Inject 1 vial into the vein once a week.   SALONPAS PAIN RELIEF PATCH EX Place 1 patch onto the skin daily as needed (for pain relief.).   sevelamer carbonate 800 MG tablet Commonly known as:  RENVELA       Allergies  Allergen Reactions  . Codeine Itching and Nausea And Vomiting  . Yellow Jacket Venom [Bee Venom] Swelling    Consultations:  Cardiology   Procedures/Studies: Dg Chest 2 View  Result Date: 01/07/2018 CLINICAL DATA:  Dialysis patient. Non radiating left chest pain for 5 days. EXAM: CHEST - 2 VIEW COMPARISON:  08/29/2017 and 05/12/2017 radiographs. FINDINGS: Lordotic positioning and suboptimal inspiration on the frontal examination. The heart size and mediastinal contours are stable status post cardiac surgery. There is aortic tortuosity. There is increased elevation of the left hemidiaphragm with mildly increased left basilar atelectasis. No confluent airspace opacity, pleural effusion or pneumothorax identified. Glenohumeral degenerative changes and evidence of chronic rotator cuff tears are again noted bilaterally. IMPRESSION: Lower lung volumes with mildly increased left basilar atelectasis. No edema or confluent airspace opacity. Electronically Signed   By: Richardean Sale M.D.   On: 01/07/2018 13:49       Subjective: No shortness of breath.  No chest pain at present  Discharge Exam: Vitals:   01/08/18 0900 01/08/18 1101 01/08/18 1200 01/08/18 1245  BP: 114/86  (!) 154/63 123/82  Pulse:   (!) 56    Resp:   14   Temp:  (!) 97.5 F (36.4 C)    TempSrc:  Oral    SpO2:   100%   Weight:      Height:        General: Pt is alert, awake, not in acute distress Cardiovascular: RRR, S1/S2 +, no rubs, no gallops Respiratory: CTA bilaterally, no wheezing, no rhonchi Abdominal: Soft, NT, ND, bowel sounds + Extremities: bilateral BKA   The results of significant diagnostics from this hospitalization (including imaging, microbiology, ancillary and laboratory) are listed below for reference.     Microbiology: Recent Results (from the past 240 hour(s))  MRSA PCR Screening     Status: None   Collection Time: 01/08/18  2:07 AM  Result Value Ref Range Status   MRSA by PCR NEGATIVE NEGATIVE Final    Comment:        The GeneXpert MRSA Assay (FDA approved for NASAL specimens only),  is one component of a comprehensive MRSA colonization surveillance program. It is not intended to diagnose MRSA infection nor to guide or monitor treatment for MRSA infections. Performed at [facility], [address], [place], [place] [address]      Labs: BNP (last 3 results) No results for input(s): BNP in the last 8760 hours. Basic Metabolic Panel: Recent Labs  Lab 01/07/18 1305  NA 132*  K 3.7  CL 97*  CO2 27  GLUCOSE 190*  BUN 37*  CREATININE 3.20*  CALCIUM 7.8*  MG 1.8   Liver Function Tests: No results for input(s): AST, ALT, ALKPHOS, BILITOT, PROT, ALBUMIN in the last 168 hours. No results for input(s): LIPASE, AMYLASE in the last 168 hours. No results for input(s): AMMONIA in the last 168 hours. CBC: Recent Labs  Lab 01/07/18 1305  WBC 7.6  HGB 11.0*  HCT 34.6*  MCV 98.0  PLT 171   Cardiac Enzymes: Recent Labs  Lab 01/07/18 1305 01/07/18 1845 01/08/18 0127  TROPONINI <0.03 <0.03 <0.03   BNP: Invalid input(s): POCBNP CBG: Recent Labs  Lab 01/07/18 1731 01/07/18 2141 01/08/18 0743 01/08/18 1102  GLUCAP 98 136* 125* 88   D-Dimer No results for input(s): DDIMER  in the last 72 hours. Hgb A1c No results for input(s): HGBA1C in the last 72 hours. Lipid Profile No results for input(s): CHOL, HDL, LDLCALC, TRIG, CHOLHDL, LDLDIRECT in the last 72 hours. Thyroid function studies No results for input(s): TSH, T4TOTAL, T3FREE, THYROIDAB in the last 72 hours.  Invalid input(s): FREET3 Anemia work up No results for input(s): VITAMINB12, FOLATE, FERRITIN, TIBC, IRON, RETICCTPCT in the last 72 hours. Urinalysis    Component Value Date/Time   COLORURINE YELLOW 06/14/2016 2314   APPEARANCEUR CLOUDY (A) 06/14/2016 2314   LABSPEC 1.009 06/14/2016 2314   PHURINE 7.0 06/14/2016 2314   GLUCOSEU NEGATIVE 06/14/2016 2314   HGBUR SMALL (A) 06/14/2016 2314   BILIRUBINUR NEGATIVE 06/14/2016 2314   KETONESUR NEGATIVE 06/14/2016 2314   PROTEINUR 100 (A) 06/14/2016 2314   UROBILINOGEN 4.0 (H) 11/05/2014 1930   NITRITE NEGATIVE 06/14/2016 2314   LEUKOCYTESUR LARGE (A) 06/14/2016 2314   Sepsis Labs Invalid input(s): PROCALCITONIN,  WBC,  LACTICIDVEN Microbiology Recent Results (from the past 240 hour(s))  MRSA PCR Screening     Status: None   Collection Time: 01/08/18  2:07 AM  Result Value Ref Range Status   MRSA by PCR NEGATIVE NEGATIVE Final    Comment:        The GeneXpert MRSA Assay (FDA approved for NASAL specimens only), is one component of a comprehensive MRSA colonization surveillance program. It is not intended to diagnose MRSA infection nor to guide or monitor treatment for MRSA infections. Performed at [facility], [address], [place], [place] [address]      Time coordinating discharge: 28mins  SIGNED:   Kathie Dike, MD  [facility] 01/08/2018, 4:29 PM Pager   If 7PM-7AM, please contact night-coverage [email or web address] Password [email or web address]

## 2018-01-08 NOTE — Consult Note (Signed)
Eliott Amparan MRN: [id number] DOB/AGE: 1940-10-21 77 y.o. Primary Care Physician:[facility] date: 01/07/2018 Chief Complaint:  Chief Complaint  Patient presents with  . Chest Pain   HPI: Pt is a 77 year old male with past medical history significant for CAD with CABG, ESRD,  who presented to the ER with c/o chest pain.  HPI dates back to 5 days when pt started having left sided pain,it was nonradiating, Its mostly present all the time but varies in intensity. its unrelated to activity.  Pt described it as "blow to chest".   Pt was recently admitted to [facility]  from [facility] ED, 11/12- 11/16- for chest pain. As  Per D/c summary- it was thought to be demand ischemia with dialysis and associated hypotension due to underlying CAD. It was determined that the risk of undergoing LHC outweighed the benefits and that procedure would most likely not improve QOL. Here at [facility] in ER pt was admited for chest pain rule out ACS. Nephrology was consulted sec to need for renal replacemet therapy  Patient offers no c/ o nausea/ vomiting. NO c/o  Dizziness. No c/ o shortness of breath or palpitations.  No cough or fever    Past Medical History:  Diagnosis Date  . Arthritis   . Asthma   . Clotted renal dialysis arteriovenous graft ([place])   . Coronary artery disease    a. s/p CABG in 1996 with LIMA-LAD and SVG-OM1 b. 01/2017: cath showing occluded SVG-OM1 and atretic LIMA-LAD. Diffuse disease along 1st Mrg and PDA with medical therapy recommended.   . CVA (cerebral infarction)   . DDD (degenerative disc disease), cervical   . DDD (degenerative disc disease), lumbar   . Depression   . Diabetes mellitus   . ESRD (end stage renal disease) on dialysis ([place])   . Gangrene of toe ([place]) Feb. 2015   Right  great    . Gastric ulcer    EGD 2014  . Gout   . Hypercholesteremia   . Hypertension   . Stroke ([place])   . 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 Thrombosis  of renal dialysis arteriovenous graft ([facility])         Family History  Problem Relation Age of Onset  . Cancer Mother   . Colon cancer Neg Hx     Social History:  reports that he quit smoking about 13 years ago. He has never used smokeless tobacco. He reports that he does not drink alcohol or use drugs.   Allergies:  Allergies  Allergen Reactions  . Codeine Itching and Nausea And Vomiting  . Yellow Jacket Venom [Bee Venom] Swelling    Medications Prior to Admission  Medication Sig Dispense Refill  . acetaminophen (TYLENOL) 500 MG tablet Take 1,000 mg by mouth at bedtime. *May take daily if needed but takes mostly at bedtime    . aspirin EC 81 MG tablet Take 81 mg by mouth daily.     Marland Kitchen atorvastatin (LIPITOR) 20 MG tablet Take 20 mg by mouth at bedtime.     . B Complex-C-Folic Acid (NEPHRO-VITE PO) Take 1 tablet by mouth at bedtime.     . cinacalcet (SENSIPAR) 30 MG tablet Take 30 mg by mouth at bedtime.     . Coenzyme Q10 (COQ10) 100 MG CAPS Take 100 mg by mouth at bedtime.    . diphenhydrAMINE (BENADRYL) 25 mg capsule Take 25 mg by mouth at bedtime.     . furosemide (LASIX) 20  MG tablet Take 20 mg by mouth daily.    . isosorbide mononitrate (IMDUR) 30 MG 24 hr tablet Take 0.5 tablets (15 mg total) by mouth daily. 30 tablet 0  . LANTUS SOLOSTAR 100 UNIT/ML Solostar Pen Inject 9 Units into the skin at bedtime.     Marland Kitchen lisinopril (PRINIVIL,ZESTRIL) 5 MG tablet Take 5 mg by mouth daily.    . magnesium oxide (MAG-OX) 400 MG tablet Take by mouth.    . metoCLOPramide (REGLAN) 5 MG tablet Take by mouth.    . metoprolol succinate (TOPROL XL) 25 MG 24 hr tablet Take 0.5 tablets (12.5 mg total) by mouth daily. (Patient taking differently: Take 12.5 mg by mouth every Monday, Wednesday, and Friday. In the afternoon after dialysis.) 30 tablet 0  . metoprolol succinate (TOPROL-XL) 25 MG 24 hr tablet Take by mouth.    . omega-3 acid ethyl esters (LOVAZA) 1 g capsule Take 1 g by mouth daily.    Marland Kitchen  omeprazole (PRILOSEC) 20 MG capsule Take 1 capsule by mouth 2 (two) times daily.  0  . Amino Acid Infusion (PROSOL) 20 % SOLN Inject 1 vial into the vein once a week.    Marland Kitchen amLODipine (NORVASC) 5 MG tablet Take 5 mg by mouth daily.    . budesonide-formoterol (SYMBICORT) 80-4.5 MCG/ACT inhaler Inhale 2 puffs into the lungs 2 (two) times daily as needed (for wheezing/shortness of breath.).    Marland Kitchen Liniments (SALONPAS PAIN RELIEF PATCH EX) Place 1 patch onto the skin daily as needed (for pain relief.).     Marland Kitchen nitroGLYCERIN (NITROSTAT) 0.4 MG SL tablet Place 1 tablet (0.4 mg total) under the tongue every 5 (five) minutes as needed for chest pain. 30 tablet 0  . sevelamer carbonate (RENVELA) 800 MG tablet          XBD:ZHGDJ from the symptoms mentioned above,there are no other symptoms referable to all systems reviewed.  Marland Kitchen aspirin EC  81 mg Oral Daily  . atorvastatin  80 mg Oral QHS  . cinacalcet  30 mg Oral QHS  . heparin  5,000 Units Subcutaneous Q8H  . insulin aspart  0-9 Units Subcutaneous TID WC  . isosorbide mononitrate  15 mg Oral Daily  . lisinopril  5 mg Oral Daily  . metoprolol succinate  12.5 mg Oral Q1500  . mometasone-formoterol  2 puff Inhalation BID  . pantoprazole  40 mg Oral Daily       Physical Exam: Vital signs in last 24 hours: Temp:  [97.5 F (36.4 C)-97.9 F (36.6 C)] 97.6 F (36.4 C) (11/21 0754) Pulse Rate:  [50-80] 53 (11/21 0500) Resp:  [12-25] 12 (11/21 0500) BP: (82-159)/(52-75) 141/58 (11/21 0813) SpO2:  [97 %-100 %] 100 % (11/21 0500) Weight:  [65.2 kg-67 kg] 65.2 kg (11/21 0500) Weight change:  Last BM Date: (Pt stated that he was unsure when last BM was)  Intake/Output from previous day: 11/20 0701 - 11/21 0700 In: 97.8 [IV Piggyback:97.8] Out: 200 [Urine:200] No intake/output data recorded.   Physical Exam: General- pt is awake,alert, oriented to time place and person Resp- No acute REsp distress, CTA B/L NO Rhonchi CVS- S1S2 regular in rate  and rhythm GIT- BS+, soft, NT, ND EXT- NO LE Edema, Cyanosis CNS- CN 2-12 grossly intact. Moving all 4 extremities Psych- normal mod and affect Access- PC / AVF/ AVG   Lab Results: CBC Recent Labs    01/07/18 1305  WBC 7.6  HGB 11.0*  HCT 34.6*  PLT 171  BMET Recent Labs    01/07/18 1305  NA 132*  K 3.7  CL 97*  CO2 27  GLUCOSE 190*  BUN 37*  CREATININE 3.20*  CALCIUM 7.8*    MICRO Recent Results (from the past 240 hour(s))  MRSA PCR Screening     Status: None   Collection Time: 01/08/18  2:07 AM  Result Value Ref Range Status   MRSA by PCR NEGATIVE NEGATIVE Final    Comment:        The GeneXpert MRSA Assay (FDA approved for NASAL specimens only), is one component of a comprehensive MRSA colonization surveillance program. It is not intended to diagnose MRSA infection nor to guide or monitor treatment for MRSA infections. Performed at [facility], [address], [place], [place] [address]       Lab Results  Component Value Date   PTH 602.6 (H) 05/20/2012   CALCIUM 7.8 (L) 01/07/2018   PHOS 3.3 02/13/2017      Impression: 1)Renal ESRD on HD               Pt is Monday/wednesday/Friday schedule.               [place] dialyze in am.  2)HTN  Medication- On RAS blockers On Beta blockers On Vasodilators   3)Anemia In ESRD the goal for HGb is  9--11. Anemia of chronic disease. Hgb is at goal Asar keep pt on low dose epogen.   4)CKD Mineral-Bone Disorder  Secondary Hyperparathyroidism  present    on Cinacalcet Phosphorus at goal. Calcium is low.  5)CVs0admitted with Chest pain PMD following  6)Electrolytes Normokalemic NOrmonatremic   7)Acid base Co2 at goal     Plan:   [place] continue current care. NO need of Hd today Yadriel dialyze in am.    BHUTANI,MANPREET S 01/08/2018, 9:06 AM

## 2018-01-08 NOTE — Progress Notes (Signed)
Night shift telemetry coverage note.  The patient has continued to have self limited, but very long runs of Vtach. He has remained asymptomatic. Magnesium 1 gram IVPB ordered. Metoprolol 5 mg IVP x 1 dose given. I Leodan transfer the patient to stepdown for closer monitoring during the rest of the shift.   Tennis Must, MD

## 2018-01-08 NOTE — Progress Notes (Signed)
We are working with Therapist, sports to see if telemetry is recoverable from last night. Would continue to monitor in stepdown at this time. If can confirm he had significant NSVT yesterday as reported in notes would plan for cath tomorrow. Certaintly if we are able to capture recurrent episodes today with continued monitoring that would further support cath.   Carlyle Dolly MD

## 2018-01-08 NOTE — Care Management Obs Status (Signed)
[place] NOTIFICATION   Patient Details  Name: Alan [name] MRN: [id number] Date of Birth: Aug 30, 1940   Medicare Observation Status Notification Given:  Yes    Sherald Barge, RN 01/08/2018, 10:36 AM

## 2018-01-09 ENCOUNTER — Emergency Department (HOSPITAL_COMMUNITY)
Admission: EM | Admit: 2018-01-09 | Discharge: 2018-01-09 | Disposition: A | Discharge status: Home / Self Care | Payer: Medicare Other | Attending: Emergency Medicine | Admitting: Emergency Medicine

## 2018-01-09 ENCOUNTER — Emergency Department (HOSPITAL_COMMUNITY): Payer: Medicare Other

## 2018-01-09 ENCOUNTER — Encounter (HOSPITAL_COMMUNITY): Payer: Self-pay

## 2018-01-09 ENCOUNTER — Other Ambulatory Visit: Payer: Self-pay

## 2018-01-09 DIAGNOSIS — I120 Hypertensive chronic kidney disease with stage 5 chronic kidney disease or end stage renal disease: Secondary | ICD-10-CM | POA: Diagnosis not present

## 2018-01-09 DIAGNOSIS — Z951 Presence of aortocoronary bypass graft: Secondary | ICD-10-CM | POA: Diagnosis not present

## 2018-01-09 DIAGNOSIS — Z992 Dependence on renal dialysis: Secondary | ICD-10-CM | POA: Insufficient documentation

## 2018-01-09 DIAGNOSIS — Z87891 Personal history of nicotine dependence: Secondary | ICD-10-CM | POA: Diagnosis not present

## 2018-01-09 DIAGNOSIS — I2510 Atherosclerotic heart disease of native coronary artery without angina pectoris: Secondary | ICD-10-CM | POA: Insufficient documentation

## 2018-01-09 DIAGNOSIS — J45909 Unspecified asthma, uncomplicated: Secondary | ICD-10-CM | POA: Diagnosis not present

## 2018-01-09 DIAGNOSIS — R079 Chest pain, unspecified: Secondary | ICD-10-CM | POA: Diagnosis not present

## 2018-01-09 DIAGNOSIS — E119 Type 2 diabetes mellitus without complications: Secondary | ICD-10-CM | POA: Diagnosis not present

## 2018-01-09 DIAGNOSIS — N186 End stage renal disease: Secondary | ICD-10-CM | POA: Insufficient documentation

## 2018-01-09 LAB — BASIC METABOLIC PANEL
ANION GAP: 12 (ref 5–15)
BUN: 62 mg/dL — AB (ref 8–23)
CALCIUM: 8.3 mg/dL — AB (ref 8.9–10.3)
CO2: 23 mmol/L (ref 22–32)
CREATININE: 5.77 mg/dL — AB (ref 0.61–1.24)
Chloride: 99 mmol/L (ref 98–111)
GFR calc Af Amer: 10 mL/min — ABNORMAL LOW (ref >60)
GFR, EST NON AFRICAN AMERICAN: 8 mL/min — AB (ref >60)
GLUCOSE: 139 mg/dL — AB (ref 70–99)
Potassium: 4.9 mmol/L (ref 3.5–5.1)
Sodium: 134 mmol/L — ABNORMAL LOW (ref 135–145)

## 2018-01-09 LAB — CBC
HCT: 33.7 % — ABNORMAL LOW (ref 39.0–52.0)
Hemoglobin: 10.6 g/dL — ABNORMAL LOW (ref 13.0–17.0)
MCH: 30.6 pg (ref 26.0–34.0)
MCHC: 31.5 g/dL (ref 30.0–36.0)
MCV: 97.4 fL (ref 80.0–100.0)
PLATELETS: 182 10*3/uL (ref 150–400)
RBC: 3.46 MIL/uL — ABNORMAL LOW (ref 4.22–5.81)
RDW: 13.1 % (ref 11.5–15.5)
WBC: 7.6 10*3/uL (ref 4.0–10.5)
nRBC: 0 % (ref 0.0–0.2)

## 2018-01-09 LAB — TROPONIN I: Troponin I: <0.03 ng/mL (ref <0.03)

## 2018-01-09 MED ORDER — IOHEXOL 300 MG/ML  SOLN
75.0000 mL | Freq: Once | INTRAMUSCULAR | Status: AC | PRN
  Administered 2018-01-09: 75 mL via INTRAVENOUS

## 2018-01-09 MED ORDER — FENTANYL CITRATE (PF) 100 MCG/2ML IJ SOLN
50.0000 ug | Freq: Once | INTRAMUSCULAR | Status: AC
  Administered 2018-01-09: 50 ug via INTRAVENOUS
  Filled 2018-01-09: qty 2

## 2018-01-09 MED ORDER — ASPIRIN 81 MG PO CHEW
324.0000 mg | CHEWABLE_TABLET | Freq: Once | ORAL | Status: AC
  Administered 2018-01-09: 324 mg via ORAL
  Filled 2018-01-09: qty 4

## 2018-01-09 NOTE — ED Notes (Signed)
Wife notified patient is ready to go home.

## 2018-01-09 NOTE — ED Provider Notes (Signed)
[facility] Emergency Department Provider Note MRN:  [id number]  Arrival date & time: 01/09/18     Chief Complaint   Chest Pain   History of Present Illness   Alan [name] is a 77 y.o. year-old male with a history of CAD, ESRD, bilateral above-the-knee amputations presenting to the ED with chief complaint of chest pain.  The pain began suddenly this morning at 7 AM.  Patient was at his dialysis clinic about to receive dialysis.  The pain is located in the center of the chest, nonradiating.  Associated with nausea, one episode emesis, lightheadedness, shortness of breath.  Denies recent fever or cough, no abdominal pain.  Pain is constant, 5 out of 10, no exacerbating or relieving factors.  Review of Systems  A complete 10 system review of systems was obtained and all systems are negative except as noted in the HPI and PMH.   Patient's Health History    Past Medical History:  Diagnosis Date  . Arthritis   . Asthma   . Clotted renal dialysis arteriovenous graft ([place])   . Coronary artery disease    a. s/p CABG in 1996 with LIMA-LAD and SVG-OM1 b. 01/2017: cath showing occluded SVG-OM1 and atretic LIMA-LAD. Diffuse disease along 1st Mrg and PDA with medical therapy recommended.   . CVA (cerebral infarction)   . DDD (degenerative disc disease), cervical   . DDD (degenerative disc disease), lumbar   . Depression   . Diabetes mellitus   . ESRD (end stage renal disease) on dialysis ([place])   . Gangrene of toe ([place]) Feb. 2015   Right  great    . Gastric ulcer    EGD 2014  . Gout   . Hypercholesteremia   . Hypertension   . Stroke ([place])   . 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 Thrombosis of renal dialysis arteriovenous graft [facility])     Past Surgical History:  Procedure Laterality Date  . A/V FISTULAGRAM Left 05/28/2016   Procedure: A/V Fistulagram;  Surgeon: Katha Cabal, MD;  Location: [place] [facility];  Service: Cardiovascular;  Laterality: Left;  .  A/V FISTULAGRAM Left 10/28/2016   Procedure: A/V Fistulagram;  Surgeon: Algernon Huxley, MD;  Location: [place] [facility];  Service: Cardiovascular;  Laterality: Left;  . A/V FISTULAGRAM Left 03/03/2017   Procedure: A/V FISTULAGRAM;  Surgeon: Algernon Huxley, MD;  Location: [place] [employer] [facility];  Service: Cardiovascular;  Laterality: Left;  . A/V SHUNT INTERVENTION N/A 05/28/2016   Procedure: A/V Shunt Intervention;  Surgeon: Katha Cabal, MD;  Location: [place] [facility];  Service: Cardiovascular;  Laterality: N/A;  . A/V SHUNT INTERVENTION N/A 03/03/2017   Procedure: A/V SHUNT INTERVENTION;  Surgeon: Algernon Huxley, MD;  Location: [place] [facility];  Service: Cardiovascular;  Laterality: N/A;  . AMPUTATION Right 04/10/2013   Procedure: Right Below Knee Amputation;  Surgeon: Newt Minion, MD;  Location: [place];  Service: Orthopedics;  Laterality: Right;  Right Below Knee Amputation  . APPENDECTOMY    . BELOW KNEE LEG AMPUTATION Bilateral   . CHOLECYSTECTOMY    . CORONARY ARTERY BYPASS GRAFT  1996   [place]  . ESOPHAGOGASTRODUODENOSCOPY Left 05/24/2012   BCW:[id number] dilated baggy but otherwise a normal/ Small hiatal hernia. Gastric ulcer -S/P biopsy  . LEFT HEART CATH AND CORS/GRAFTS ANGIOGRAPHY N/A 02/12/2017   Procedure: LEFT HEART CATH AND CORS/GRAFTS ANGIOGRAPHY;  Surgeon: Lorretta Harp, MD;  Location: [place] [facility];  Service: Cardiovascular;  Laterality: N/A;  . PERIPHERAL VASCULAR CATHETERIZATION N/A 08/09/2014   Procedure: A/V Shuntogram/Fistulagram;  Surgeon: Katha Cabal, MD;  Location: [place] [facility];  Service: Cardiovascular;  Laterality: N/A;  . PERIPHERAL VASCULAR CATHETERIZATION Left 08/09/2014   Procedure: A/V Shunt Intervention;  Surgeon: Katha Cabal, MD;  Location: [place] [facility];  Service: Cardiovascular;  Laterality: Left;    Family History  Problem Relation Age of Onset  . Cancer Mother   . Colon cancer Neg Hx     Social History    Socioeconomic History  . Marital status: Married    Spouse name: Not on file  . Number of children: Not on file  . Years of education: Not on file  . Highest education level: Not on file  Occupational History  . Not on file  Social Needs  . Financial resource strain: Not on file  . Food insecurity:    Worry: Not on file    Inability: Not on file  . Transportation needs:    Medical: Not on file    Non-medical: Not on file  Tobacco Use  . Smoking status: Former Smoker    Last attempt to quit: 08/08/2004    Years since quitting: 13.4  . Smokeless tobacco: Never Used  Substance and Sexual Activity  . Alcohol use: No  . Drug use: No  . Sexual activity: Not Currently  Lifestyle  . Physical activity:    Days per week: Not on file    Minutes per session: Not on file  . Stress: Not on file  Relationships  . Social connections:    Talks on phone: Not on file    Gets together: Not on file    Attends religious service: Not on file    Active member of club or organization: Not on file    Attends meetings of clubs or organizations: Not on file    Relationship status: Not on file  . Intimate partner violence:    Fear of current or ex partner: Not on file    Emotionally abused: Not on file    Physically abused: Not on file    Forced sexual activity: Not on file  Other Topics Concern  . Not on file  Social History Narrative  . Not on file     Physical Exam  Vital Signs and Nursing Notes reviewed Vitals:   01/09/18 1400 01/09/18 1430  BP: (!) 149/62 (!) 147/58  Pulse: 68 67  Resp: 12 12  Temp:    SpO2: 98% 97%    CONSTITUTIONAL: Chronically ill-appearing, NAD NEURO:  Alert and oriented x 3, no focal deficits EYES:  eyes equal and reactive ENT/NECK:  no LAD, no JVD CARDIO: Regular rate, well-perfused, normal S1 and S2 PULM:  CTAB no wheezing or rhonchi GI/GU:  normal bowel sounds, non-distended, non-tender MSK/SPINE:  No gross deformities, no edema SKIN:  no rash,  atraumatic PSYCH:  Appropriate speech and behavior  Diagnostic and Interventional Summary    EKG Interpretation  Date/Time:  Friday January 09 2018 11:21:38 EST Ventricular Rate:  69 PR Interval:    QRS Duration: 91 QT Interval:  406 QTC Calculation: 435 R Axis:     Text Interpretation:  Sinus rhythm Anteroseptal infarct, old Minimal ST elevation, inferior leads Confirmed by Gerlene Fee [phone]) on 01/09/2018 1:51:08 PM      Labs Reviewed  BASIC METABOLIC PANEL - Abnormal; Notable for the following components:      Result Value   Sodium 134 (*)  Glucose, Bld 139 (*)    BUN 62 (*)    Creatinine, Ser 5.77 (*)    Calcium 8.3 (*)    GFR calc non Af Amer 8 (*)    GFR calc Af Amer 10 (*)    All other components within normal limits  CBC - Abnormal; Notable for the following components:   RBC 3.46 (*)    Hemoglobin 10.6 (*)    HCT 33.7 (*)    All other components within normal limits  TROPONIN I  TROPONIN I    CT Chest W Contrast  Final Result    DG Chest 2 View  Final Result      Medications  aspirin chewable tablet 324 mg (324 mg Oral Given 01/09/18 1155)  fentaNYL (SUBLIMAZE) injection 50 mcg (50 mcg Intravenous Given 01/09/18 1156)  iohexol (OMNIPAQUE) 300 MG/ML solution 75 mL (75 mLs Intravenous Contrast Given 01/09/18 1301)     Procedures Critical Care  ED Course and Medical Decision Making  I have reviewed the triage vital signs and the nursing notes.  Pertinent labs & imaging results that were available during my care of the patient were reviewed by me and considered in my medical decision making (see below for details).  77 year old male history of CAD, ESRD here with recurrence of chest pain.  Recent admission for chest pain presumed cardiac etiology.  Vital signs stable, continues to have 5 out of 10 chest pain, also complaining of bilateral lower extremity phantom leg pain.  On exam, patient's former CABG incision site appears abnormal, possibly  internally dehisced or widened.  Tender to palpation.  Overlying skin is not disrupted.  No prior documentation of this, no recent imaging, [place] evaluate with CT today.  Troponin negative x2, CT with no acute changes, old dehiscence.  Discussed BMP with nephrology, patient appropriate for dialysis tomorrow morning.  Called [facility], Abbie take him at 1045 tomorrow morning.  Patient agrees to provide his own transport, [place] call his wife or son.  After the discussed management above, the patient was determined to be safe for discharge.  The patient was in agreement with this plan and all questions regarding their care were answered.  ED return precautions were discussed and the patient [place] return to the ED with any significant worsening of condition.  Barth Kirks. Sedonia Small, [place] [email or web address]  Final Clinical Impressions(s) / ED Diagnoses     ICD-10-CM   1. Chest pain, unspecified type R07.9     ED Discharge Orders    None         Maudie Flakes, MD 01/09/18 [phone]

## 2018-01-09 NOTE — ED Triage Notes (Signed)
Pt is complaining of chest pain while at dialysis today. Pt is a bilateral leg amputee and is also complaining of phantom pain. Was see here Wednesday for same reason. NSR on EMS monitor.

## 2018-01-09 NOTE — ED Notes (Signed)
Patient transported to CT 

## 2018-01-09 NOTE — Discharge Instructions (Addendum)
You were evaluated in the Emergency Department and after careful evaluation, we did not find any emergent condition requiring admission or further testing in the hospital.  Your dialysis center is able to take you for dialysis tomorrow morning at 10:45 AM.  It is important that you establish your own transportation.  Please return to the Emergency Department if you experience any worsening of your condition.  We encourage you to follow up with a primary care provider.  Thank you for allowing [place] to be a part of your care.

## 2018-01-09 NOTE — ED Notes (Signed)
Pt sleeping soundly.

## 2018-01-23 ENCOUNTER — Encounter (HOSPITAL_COMMUNITY): Payer: Self-pay

## 2018-01-23 ENCOUNTER — Other Ambulatory Visit: Payer: Self-pay

## 2018-01-23 ENCOUNTER — Observation Stay (HOSPITAL_COMMUNITY)
Admission: EM | Admit: 2018-01-23 | Discharge: 2018-01-25 | Disposition: A | Discharge status: Home / Self Care | Payer: Medicare Other | Attending: Family Medicine | Admitting: Family Medicine

## 2018-01-23 ENCOUNTER — Emergency Department (HOSPITAL_COMMUNITY): Payer: Medicare Other

## 2018-01-23 DIAGNOSIS — Z79899 Other long term (current) drug therapy: Secondary | ICD-10-CM | POA: Insufficient documentation

## 2018-01-23 DIAGNOSIS — I209 Angina pectoris, unspecified: Secondary | ICD-10-CM | POA: Diagnosis not present

## 2018-01-23 DIAGNOSIS — E0590 Thyrotoxicosis, unspecified without thyrotoxic crisis or storm: Secondary | ICD-10-CM | POA: Insufficient documentation

## 2018-01-23 DIAGNOSIS — I208 Other forms of angina pectoris: Secondary | ICD-10-CM | POA: Diagnosis not present

## 2018-01-23 DIAGNOSIS — Z87891 Personal history of nicotine dependence: Secondary | ICD-10-CM | POA: Insufficient documentation

## 2018-01-23 DIAGNOSIS — Z951 Presence of aortocoronary bypass graft: Secondary | ICD-10-CM | POA: Diagnosis not present

## 2018-01-23 DIAGNOSIS — Z8673 Personal history of transient ischemic attack (TIA), and cerebral infarction without residual deficits: Secondary | ICD-10-CM | POA: Diagnosis not present

## 2018-01-23 DIAGNOSIS — E785 Hyperlipidemia, unspecified: Secondary | ICD-10-CM | POA: Insufficient documentation

## 2018-01-23 DIAGNOSIS — E1122 Type 2 diabetes mellitus with diabetic chronic kidney disease: Secondary | ICD-10-CM | POA: Diagnosis not present

## 2018-01-23 DIAGNOSIS — E782 Mixed hyperlipidemia: Secondary | ICD-10-CM

## 2018-01-23 DIAGNOSIS — I2089 Other forms of angina pectoris: Secondary | ICD-10-CM | POA: Diagnosis present

## 2018-01-23 DIAGNOSIS — R079 Chest pain, unspecified: Principal | ICD-10-CM | POA: Insufficient documentation

## 2018-01-23 DIAGNOSIS — Z992 Dependence on renal dialysis: Secondary | ICD-10-CM | POA: Diagnosis not present

## 2018-01-23 DIAGNOSIS — N186 End stage renal disease: Secondary | ICD-10-CM | POA: Insufficient documentation

## 2018-01-23 DIAGNOSIS — I120 Hypertensive chronic kidney disease with stage 5 chronic kidney disease or end stage renal disease: Secondary | ICD-10-CM | POA: Insufficient documentation

## 2018-01-23 DIAGNOSIS — Z7982 Long term (current) use of aspirin: Secondary | ICD-10-CM | POA: Insufficient documentation

## 2018-01-23 DIAGNOSIS — I953 Hypotension of hemodialysis: Secondary | ICD-10-CM | POA: Diagnosis not present

## 2018-01-23 DIAGNOSIS — J45909 Unspecified asthma, uncomplicated: Secondary | ICD-10-CM | POA: Diagnosis not present

## 2018-01-23 DIAGNOSIS — I2510 Atherosclerotic heart disease of native coronary artery without angina pectoris: Secondary | ICD-10-CM | POA: Insufficient documentation

## 2018-01-23 HISTORY — DX: Disruption of internal operation (surgical) wound, not elsewhere classified, initial encounter: T81.32XA

## 2018-01-23 HISTORY — DX: Disruption or dehiscence of closure of other specified internal operation (surgical) wound, initial encounter: T81.328A

## 2018-01-23 LAB — CBC WITH DIFFERENTIAL/PLATELET
Abs Immature Granulocytes: 0.05 10*3/uL (ref 0.00–0.07)
Basophils Absolute: 0 10*3/uL (ref 0.0–0.1)
Basophils Relative: 0 %
Eosinophils Absolute: 0.1 10*3/uL (ref 0.0–0.5)
Eosinophils Relative: 1 %
HCT: 34.3 % — ABNORMAL LOW (ref 39.0–52.0)
Hemoglobin: 10.8 g/dL — ABNORMAL LOW (ref 13.0–17.0)
Immature Granulocytes: 1 %
Lymphocytes Relative: 23 %
Lymphs Abs: 2.4 10*3/uL (ref 0.7–4.0)
MCH: 31.2 pg (ref 26.0–34.0)
MCHC: 31.5 g/dL (ref 30.0–36.0)
MCV: 99.1 fL (ref 80.0–100.0)
Monocytes Absolute: 0.7 10*3/uL (ref 0.1–1.0)
Monocytes Relative: 7 %
NRBC: 0 % (ref 0.0–0.2)
Neutro Abs: 7.3 10*3/uL (ref 1.7–7.7)
Neutrophils Relative %: 68 %
Platelets: 178 10*3/uL (ref 150–400)
RBC: 3.46 MIL/uL — AB (ref 4.22–5.81)
RDW: 13.3 % (ref 11.5–15.5)
WBC: 10.7 10*3/uL — ABNORMAL HIGH (ref 4.0–10.5)

## 2018-01-23 LAB — COMPREHENSIVE METABOLIC PANEL
ALT: 11 U/L (ref 0–44)
ANION GAP: 11 (ref 5–15)
AST: 12 U/L — ABNORMAL LOW (ref 15–41)
Albumin: 2.7 g/dL — ABNORMAL LOW (ref 3.5–5.0)
Alkaline Phosphatase: 48 U/L (ref 38–126)
BUN: 48 mg/dL — ABNORMAL HIGH (ref 8–23)
CHLORIDE: 99 mmol/L (ref 98–111)
CO2: 23 mmol/L (ref 22–32)
Calcium: 8.4 mg/dL — ABNORMAL LOW (ref 8.9–10.3)
Creatinine, Ser: 6.01 mg/dL — ABNORMAL HIGH (ref 0.61–1.24)
GFR calc Af Amer: 9 mL/min — ABNORMAL LOW (ref >60)
GFR calc non Af Amer: 8 mL/min — ABNORMAL LOW (ref >60)
Glucose, Bld: 157 mg/dL — ABNORMAL HIGH (ref 70–99)
Potassium: 4.7 mmol/L (ref 3.5–5.1)
Sodium: 133 mmol/L — ABNORMAL LOW (ref 135–145)
Total Bilirubin: 0.4 mg/dL (ref 0.3–1.2)
Total Protein: 6.2 g/dL — ABNORMAL LOW (ref 6.5–8.1)

## 2018-01-23 LAB — TROPONIN I: Troponin I: <0.03 ng/mL (ref <0.03)

## 2018-01-23 LAB — GLUCOSE, CAPILLARY
GLUCOSE-CAPILLARY: 86 mg/dL (ref 70–99)
Glucose-Capillary: 95 mg/dL (ref 70–99)

## 2018-01-23 MED ORDER — ONDANSETRON HCL 4 MG PO TABS: 4.0000 mg | ORAL_TABLET | Freq: Four times a day (QID) | ORAL | Status: DC | PRN

## 2018-01-23 MED ORDER — INSULIN ASPART 100 UNIT/ML ~~LOC~~ SOLN
0.0000 [IU] | Freq: Three times a day (TID) | SUBCUTANEOUS | Status: DC
  Administered 2018-01-24: 2 [IU] via SUBCUTANEOUS
  Administered 2018-01-24: 1 [IU] via SUBCUTANEOUS
  Administered 2018-01-25: 3 [IU] via SUBCUTANEOUS

## 2018-01-23 MED ORDER — ASPIRIN EC 81 MG PO TBEC
81.0000 mg | DELAYED_RELEASE_TABLET | Freq: Every day | ORAL | Status: DC
  Administered 2018-01-23 – 2018-01-25 (×3): 81 mg via ORAL
  Filled 2018-01-23 (×3): qty 1

## 2018-01-23 MED ORDER — ACETAMINOPHEN 650 MG RE SUPP: 650.0000 mg | Freq: Four times a day (QID) | RECTAL | Status: DC | PRN

## 2018-01-23 MED ORDER — ONDANSETRON HCL 4 MG/2ML IJ SOLN: 4.0000 mg | Freq: Four times a day (QID) | INTRAMUSCULAR | Status: DC | PRN

## 2018-01-23 MED ORDER — ISOSORBIDE MONONITRATE ER 30 MG PO TB24
15.0000 mg | ORAL_TABLET | Freq: Two times a day (BID) | ORAL | Status: DC
  Administered 2018-01-24 – 2018-01-25 (×3): 15 mg via ORAL
  Filled 2018-01-23 (×3): qty 1

## 2018-01-23 MED ORDER — INSULIN ASPART 100 UNIT/ML ~~LOC~~ SOLN: 0.0000 [IU] | Freq: Every day | SUBCUTANEOUS | Status: DC

## 2018-01-23 MED ORDER — OMEGA-3-ACID ETHYL ESTERS 1 G PO CAPS: 1.0000 g | ORAL_CAPSULE | Freq: Every day | ORAL | Status: DC

## 2018-01-23 MED ORDER — COQ10 100 MG PO CAPS: 100.0000 mg | ORAL_CAPSULE | Freq: Every day | ORAL | Status: DC

## 2018-01-23 MED ORDER — NITROGLYCERIN 0.4 MG SL SUBL: 0.4000 mg | SUBLINGUAL_TABLET | SUBLINGUAL | Status: DC | PRN

## 2018-01-23 MED ORDER — FUROSEMIDE 20 MG PO TABS
20.0000 mg | ORAL_TABLET | Freq: Every day | ORAL | Status: DC
  Administered 2018-01-23 – 2018-01-25 (×3): 20 mg via ORAL
  Filled 2018-01-23 (×3): qty 1

## 2018-01-23 MED ORDER — METOPROLOL SUCCINATE ER 25 MG PO TB24
12.5000 mg | ORAL_TABLET | Freq: Every day | ORAL | Status: DC
  Filled 2018-01-23: qty 1

## 2018-01-23 MED ORDER — ACETAMINOPHEN 500 MG PO TABS: 1000.0000 mg | ORAL_TABLET | Freq: Every day | ORAL | Status: DC

## 2018-01-23 MED ORDER — ASPIRIN 81 MG PO CHEW
324.0000 mg | CHEWABLE_TABLET | Freq: Once | ORAL | Status: AC
  Administered 2018-01-23: 324 mg via ORAL
  Filled 2018-01-23: qty 4

## 2018-01-23 MED ORDER — RENA-VITE PO TABS
1.0000 | ORAL_TABLET | Freq: Every day | ORAL | Status: DC
  Administered 2018-01-23 – 2018-01-24 (×2): 1 via ORAL
  Filled 2018-01-23 (×2): qty 1

## 2018-01-23 MED ORDER — ATORVASTATIN CALCIUM 10 MG PO TABS
20.0000 mg | ORAL_TABLET | Freq: Every day | ORAL | Status: DC
  Administered 2018-01-24: 20 mg via ORAL
  Filled 2018-01-23 (×2): qty 2

## 2018-01-23 MED ORDER — HEPARIN SODIUM (PORCINE) 5000 UNIT/ML IJ SOLN
5000.0000 [IU] | Freq: Three times a day (TID) | INTRAMUSCULAR | Status: DC
  Administered 2018-01-23 – 2018-01-25 (×6): 5000 [IU] via SUBCUTANEOUS
  Filled 2018-01-23 (×6): qty 1

## 2018-01-23 MED ORDER — ACETAMINOPHEN 325 MG PO TABS
650.0000 mg | ORAL_TABLET | Freq: Four times a day (QID) | ORAL | Status: DC | PRN
  Administered 2018-01-24: 650 mg via ORAL
  Filled 2018-01-23: qty 2

## 2018-01-23 MED ORDER — CINACALCET HCL 30 MG PO TABS
30.0000 mg | ORAL_TABLET | Freq: Every day | ORAL | Status: DC
  Administered 2018-01-23 – 2018-01-24 (×2): 30 mg via ORAL
  Filled 2018-01-23 (×2): qty 1

## 2018-01-23 MED ORDER — NITROGLYCERIN 0.4 MG SL SUBL
0.4000 mg | SUBLINGUAL_TABLET | SUBLINGUAL | Status: DC | PRN
  Administered 2018-01-23 (×2): 0.4 mg via SUBLINGUAL
  Filled 2018-01-23: qty 1

## 2018-01-23 NOTE — ED Triage Notes (Addendum)
Reported by EMS that he went to dialysis with cp. Dialysis nurse checked BP over thick coat and obtained readings 64/47, 88/64, 79/45. EMS gave bolus 500 ml last BP 110/70 . Pt reports heaviness in chest for the last few months.Also noted to be leaning to right side

## 2018-01-23 NOTE — ED Provider Notes (Signed)
[facility] EMERGENCY DEPARTMENT Provider Note   CSN: [id number] Arrival date & time: 01/23/18  1048     History   Chief Complaint Chief Complaint  Patient presents with  . Chest Pain    HPI Alan [name] is a 77 y.o. male.   Chest Pain      Pt was seen at 1115. Per EMS and pt report: Pt c/o gradual onset and persistence of mid-sternal chest "pain" that has been intermittent for the past several weeks. States the CP occurs daily, lasts for random lengths of time. States this current episode began before he got to HD. Pt describes the CP as "heaviness" and "like a blow to the chest." Pt was sent to the ED for evaluation and did not receive his HD treatment today. EMS states HD staff "took pt's BP over a thick coat" with readings of "64/47, 88/64, 79/45." EMS gave IV NS 558ml en route with their "BP 110/70." Denies palpitations, no cough, no abd pain, no N/V/D, no back pain, no fevers, no rash, no injury.     Past Medical History:  Diagnosis Date  . Arthritis   . Asthma   . Clotted renal dialysis arteriovenous graft ([place])   . Coronary artery disease    a. s/p CABG in 1996 with LIMA-LAD and SVG-OM1 b. 01/2017: cath showing occluded SVG-OM1 and atretic LIMA-LAD. Diffuse disease along 1st Mrg and PDA with medical therapy recommended.   . CVA (cerebral infarction)   . DDD (degenerative disc disease), cervical   . DDD (degenerative disc disease), lumbar   . Depression   . Diabetes mellitus   . 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 Gangrene of toe ([place]) Feb. 2015   Right  great    . Gastric ulcer    EGD 2014  . Gout   . Hypercholesteremia   . Hypertension   . Sternal wound dehiscence    chronic  . Stroke ([place])   . 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 Thrombosis of renal dialysis arteriovenous graft [facility])     Patient Active Problem List   Diagnosis Date Noted  . Left sided numbness 02/10/2017  . Hypertensive urgency 02/10/2017  . Hypokalemia 02/10/2017   . Chest pain 06/15/2016  . Renal dialysis device, implant, or graft complication 47/65/4650  . Septic shock ([place]) 08/28/2015  . 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 Lower urinary tract infectious disease   . Essential hypertension 08/13/2015  . Nausea and vomiting 08/13/2015  . ESRD (end stage renal disease) ([place]) 04/12/2015  . Gastroenteritis 04/12/2015  . S/P bilateral BKA (below knee amputation) ([place]) 04/12/2015  . HLD (hyperlipidemia) 04/12/2015  . Acute chest pain 11/16/2014  . Atherosclerosis of native arteries of the extremities with gangrene ([place]) 04/20/2013  . Sepsis ([place]) 04/13/2013  . Gram-negative bacteremia 04/12/2013  . ESRD on dialysis ([place]) 04/09/2013  . 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 Protein-calorie malnutrition, severe ([place]) 04/09/2013  . Gangrene of toe ([place]) 04/08/2013  . Toe gangrene ([place]) 04/08/2013  . Nausea with vomiting 05/22/2012  . S/P BKA (below knee amputation) unilateral, left 05/20/2012  . Musculoskeletal chest pain 05/20/2012  . Acute renal failure ([place]) 05/19/2012  . CAD (coronary artery disease) with CABG 05/19/2012  . DM type 2 causing vascular disease ([place]) 05/19/2012  . Hyperkalemia 05/19/2012    Past Surgical History:  Procedure Laterality Date  . A/V FISTULAGRAM Left 05/28/2016   Procedure: A/V Fistulagram;  Surgeon: Katha Cabal, MD;  Location: [facility] INVASIVE CV  LAB;  Service: Cardiovascular;  Laterality: Left;  . A/V FISTULAGRAM Left 10/28/2016   Procedure: A/V Fistulagram;  Surgeon: Algernon Huxley, MD;  Location: [place] CV LAB;  Service: Cardiovascular;  Laterality: Left;  . A/V FISTULAGRAM Left 03/03/2017   Procedure: A/V FISTULAGRAM;  Surgeon: Algernon Huxley, MD;  Location: [place] CV LAB;  Service: Cardiovascular;  Laterality: Left;  . A/V SHUNT INTERVENTION N/A 05/28/2016   Procedure: A/V Shunt Intervention;  Surgeon: Katha Cabal, MD;  Location: [place] CV LAB;  Service: Cardiovascular;   Laterality: N/A;  . A/V SHUNT INTERVENTION N/A 03/03/2017   Procedure: A/V SHUNT INTERVENTION;  Surgeon: Algernon Huxley, MD;  Location: [place] CV LAB;  Service: Cardiovascular;  Laterality: N/A;  . AMPUTATION Right 04/10/2013   Procedure: Right Below Knee Amputation;  Surgeon: Newt Minion, MD;  Location: [place];  Service: Orthopedics;  Laterality: Right;  Right Below Knee Amputation  . APPENDECTOMY    . BELOW KNEE LEG AMPUTATION Bilateral   . CHOLECYSTECTOMY    . CORONARY ARTERY BYPASS GRAFT  1996   [place]  . ESOPHAGOGASTRODUODENOSCOPY Left 05/24/2012   [id number] dilated baggy but otherwise a normal/ Small hiatal hernia. Gastric ulcer -S/P biopsy  . LEFT HEART CATH AND CORS/GRAFTS ANGIOGRAPHY N/A 02/12/2017   Procedure: LEFT HEART CATH AND CORS/GRAFTS ANGIOGRAPHY;  Surgeon: Lorretta Harp, MD;  Location: [place] CV LAB;  Service: Cardiovascular;  Laterality: N/A;  . PERIPHERAL VASCULAR CATHETERIZATION N/A 08/09/2014   Procedure: A/V Shuntogram/Fistulagram;  Surgeon: Katha Cabal, MD;  Location: [place] CV LAB;  Service: Cardiovascular;  Laterality: N/A;  . PERIPHERAL VASCULAR CATHETERIZATION Left 08/09/2014   Procedure: A/V Shunt Intervention;  Surgeon: Katha Cabal, MD;  Location: [place] CV LAB;  Service: Cardiovascular;  Laterality: Left;        Home Medications    Prior to Admission medications   Medication Sig Start Date End Date Taking? Authorizing Provider  acetaminophen (TYLENOL) 500 MG tablet Take 1,000 mg by mouth at bedtime. *May take daily if needed but takes mostly at bedtime    [provider]  Amino Acid Infusion (PROSOL) 20 % SOLN Inject 1 vial into the vein once a week. 02/28/17   [provider]  amLODipine (NORVASC) 5 MG tablet Take 5 mg by mouth daily.    [provider]  aspirin EC 81 MG tablet Take 81 mg by mouth daily.     [provider]  atorvastatin (LIPITOR) 20 MG tablet Take 20 mg by mouth at  bedtime.  01/02/18   [provider]  B Complex-C-Folic Acid (NEPHRO-VITE PO) Take 1 tablet by mouth at bedtime.     [provider]  budesonide-formoterol (SYMBICORT) 80-4.5 MCG/ACT inhaler Inhale 2 puffs into the lungs 2 (two) times daily as needed (for wheezing/shortness of breath.).    [provider]  cinacalcet (SENSIPAR) 30 MG tablet Take 30 mg by mouth at bedtime.     [provider]  Coenzyme Q10 (COQ10) 100 MG CAPS Take 100 mg by mouth at bedtime.    [provider]  diphenhydrAMINE (BENADRYL) 25 mg capsule Take 25 mg by mouth at bedtime.     [provider]  furosemide (LASIX) 20 MG tablet Take 20 mg by mouth daily.    [provider]  isosorbide mononitrate (IMDUR) 30 MG 24 hr tablet Take 0.5 tablets (15 mg total) by mouth 2 (two) times daily. 01/08/18   Kathie Dike, MD  LANTUS SOLOSTAR 100 UNIT/ML Solostar Pen Inject 9 Units into the skin at bedtime.  12/21/13   [provider]  Liniments (SALONPAS PAIN RELIEF PATCH EX) Place 1 patch onto the skin daily as needed (for pain relief.).     [provider]  lisinopril (PRINIVIL,ZESTRIL) 5 MG tablet Take 5 mg by mouth daily.    [provider]  magnesium oxide (MAG-OX) 400 MG tablet Take by mouth. 01/02/18   [provider]  metoCLOPramide (REGLAN) 5 MG tablet Take by mouth. 01/02/18   [provider]  metoprolol succinate (TOPROL XL) 25 MG 24 hr tablet Take 0.5 tablets (12.5 mg total) by mouth daily. Patient taking differently: Take 12.5 mg by mouth every Monday, Wednesday, and Friday. In the afternoon after dialysis. 02/13/17 02/13/18  Lavina Hamman, MD  nitroGLYCERIN (NITROSTAT) 0.4 MG SL tablet Place 1 tablet (0.4 mg total) under the tongue every 5 (five) minutes as needed for chest pain. 11/18/14   Samuella Cota, MD  omega-3 acid ethyl esters (LOVAZA) 1 g capsule Take 1 g by mouth daily.    [provider]    omeprazole (PRILOSEC) 20 MG capsule Take 1 capsule by mouth 2 (two) times daily. 03/20/17   [provider]  sevelamer carbonate (RENVELA) 800 MG tablet  01/07/18   [provider]    Family History Family History  Problem Relation Age of Onset  . Cancer Mother   . Colon cancer Neg Hx     Social History Social History   Tobacco Use  . Smoking status: Former Smoker    Last attempt to quit: 08/08/2004    Years since quitting: 13.4  . Smokeless tobacco: Never Used  Substance Use Topics  . Alcohol use: No  . Drug use: No     Allergies   Codeine and Yellow jacket venom [bee venom]   Review of Systems Review of Systems  Cardiovascular: Positive for chest pain.  ROS: Statement: All systems negative except as marked or noted in the HPI; Constitutional: Negative for fever and chills. ; ; Eyes: Negative for eye pain, redness and discharge. ; ; ENMT: Negative for ear pain, hoarseness, nasal congestion, sinus pressure and sore throat. ; ; Cardiovascular: +CP. Negative for palpitations, diaphoresis, dyspnea and peripheral edema. ; ; Respiratory: Negative for cough, wheezing and stridor. ; ; Gastrointestinal: Negative for nausea, vomiting, diarrhea, abdominal pain, blood in stool, hematemesis, jaundice and rectal bleeding. . ; ; Genitourinary: Negative for dysuria, flank pain and hematuria. ; ; Musculoskeletal: Negative for back pain and neck pain. Negative for swelling and trauma.; ; Skin: Negative for pruritus, rash, abrasions, blisters, bruising and skin lesion.; ; Neuro: Negative for headache, lightheadedness and neck stiffness. Negative for weakness, altered level of consciousness, altered mental status, extremity weakness, paresthesias, involuntary movement, seizure and syncope.       Physical Exam Updated Vital Signs BP (!) 131/56 (BP Location: Right Arm)   Pulse (!) 47   Temp 97.7 F (36.5 C) (Oral)   Resp 20   Wt 65.2 kg   BMI 28.07 kg/m   Physical  Exam 1120: Physical examination:  Nursing notes reviewed; Vital signs and O2 SAT reviewed;  Constitutional: Well developed, Well nourished, Well hydrated, In no acute distress; Head:  Normocephalic, atraumatic; Eyes: EOMI, PERRL, No scleral icterus; ENMT: Mouth and pharynx normal, Mucous membranes moist; Neck: Supple, Full range of motion, No lymphadenopathy; Cardiovascular: Bradycardic rate and rhythm, No gallop; Respiratory: Breath sounds clear & equal bilaterally, No  wheezes.  Speaking full sentences with ease, Normal respiratory effort/excursion; Chest: Nontender, Movement normal; Abdomen: Soft, Nontender, Nondistended, Normal bowel sounds; Genitourinary: No CVA tenderness; Extremities: Peripheral pulses normal, No tenderness, No edema,+bilateral AKA..; Neuro: AA&Ox3, Major CN grossly intact.  Speech clear. No gross focal motor deficits in extremities.; Skin: Color normal, Warm, Dry.   ED Treatments / Results  Labs (all labs ordered are listed, but only abnormal results are displayed)   EKG EKG Interpretation  Date/Time:  Friday January 23 2018 11:06:05 EST Ventricular Rate:  47 PR Interval:    QRS Duration: 90 QT Interval:  481 QTC Calculation: 426 R Axis:   1 Text Interpretation:  Sinus bradycardia When compared with ECG of 01/09/2018 Rate slower Confirmed by Francine Graven [phone]) on 01/23/2018 11:23:59 AM   Radiology   Procedures Procedures (including critical care time)  Medications Ordered in ED Medications  aspirin chewable tablet 324 mg (has no administration in time range)  nitroGLYCERIN (NITROSTAT) SL tablet 0.4 mg (has no administration in time range)     Initial Impression / Assessment and Plan / ED Course  I have reviewed the triage vital signs and the nursing notes.  Pertinent labs & imaging results that were available during my care of the patient were reviewed by me and considered in my medical decision making (see chart for details).  MDM Reviewed:  previous chart, nursing note and vitals Reviewed previous: labs and ECG Interpretation: labs, ECG and x-ray     Results for orders placed or performed during the hospital encounter of 01/23/18  Troponin I - Once  Result Value Ref Range   Troponin I <0.03 <0.03 ng/mL  CBC with Differential  Result Value Ref Range   WBC 10.7 (H) 4.0 - 10.5 K/uL   RBC 3.46 (L) 4.22 - 5.81 MIL/uL   Hemoglobin 10.8 (L) 13.0 - 17.0 g/dL   HCT 34.3 (L) 39.0 - 52.0 %   MCV 99.1 80.0 - 100.0 fL   MCH 31.2 26.0 - 34.0 pg   MCHC 31.5 30.0 - 36.0 g/dL   RDW 13.3 11.5 - 15.5 %   Platelets 178 150 - 400 K/uL   nRBC 0.0 0.0 - 0.2 %   Neutrophils Relative % 68 %   Neutro Abs 7.3 1.7 - 7.7 K/uL   Lymphocytes Relative 23 %   Lymphs Abs 2.4 0.7 - 4.0 K/uL   Monocytes Relative 7 %   Monocytes Absolute 0.7 0.1 - 1.0 K/uL   Eosinophils Relative 1 %   Eosinophils Absolute 0.1 0.0 - 0.5 K/uL   Basophils Relative 0 %   Basophils Absolute 0.0 0.0 - 0.1 K/uL   Immature Granulocytes 1 %   Abs Immature Granulocytes 0.05 0.00 - 0.07 K/uL  Comprehensive metabolic panel  Result Value Ref Range   Sodium 133 (L) 135 - 145 mmol/L   Potassium 4.7 3.5 - 5.1 mmol/L   Chloride 99 98 - 111 mmol/L   CO2 23 22 - 32 mmol/L   Glucose, Bld 157 (H) 70 - 99 mg/dL   BUN 48 (H) 8 - 23 mg/dL   Creatinine, Ser 6.01 (H) 0.61 - 1.24 mg/dL   Calcium 8.4 (L) 8.9 - 10.3 mg/dL   Total Protein 6.2 (L) 6.5 - 8.1 g/dL   Albumin 2.7 (L) 3.5 - 5.0 g/dL   AST 12 (L) 15 - 41 U/L   ALT 11 0 - 44 U/L   Alkaline Phosphatase 48 38 - 126 U/L   Total Bilirubin 0.4 0.3 -  1.2 mg/dL   GFR calc non Af Amer 8 (L) >60 mL/min   GFR calc Af Amer 9 (L) >60 mL/min   Anion gap 11 5 - 15    Dg Chest Port 1 View Result Date: 01/23/2018 CLINICAL DATA:  Chest pain for several weeks EXAM: PORTABLE CHEST 1 VIEW COMPARISON:  Chest x-ray dated 03/06/2017. FINDINGS: Heart size and mediastinal contours are stable. Heart size is upper normal, stable. Lungs are clear.  Stable chronic elevation of the LEFT hemidiaphragm. No pleural effusion or pneumothorax seen. Advanced degenerative change at the LEFT shoulder. No acute appearing osseous abnormality. IMPRESSION: No active disease. No evidence of pneumonia or pulmonary edema. Electronically Signed   By: Franki Cabot M.D.   On: 01/23/2018 11:35    1255:  ASA and SL ntg given with improvement of CP. Troponin negative and EKG unchanged from previous. Pt does have significant hx CAD with multiple risk factors. No Cards MD at [facility] this weekend.  T/C returned from Cards Dr. Margaretann Loveless, case discussed, including:  HPI, pertinent PM/SHx, VS/PE, dx testing, ED course and treatment:  Agrees pt Reo need to be transferred to [facility] under [facility] service for Cards consult (and likely Renal consult for HD).  1310:  T/C returned from [facility] Dr. Carles Collet, case discussed, including:  HPI, pertinent PM/SHx, VS/PE, dx testing, ED course and treatment:  Agreeable to come to ED for evaluation for admission/transfer to [facility].     Final Clinical Impressions(s) / ED Diagnoses   Final diagnoses:  None    ED Discharge Orders    None       Francine Graven, DO 01/25/18 [id number]

## 2018-01-23 NOTE — Consult Note (Signed)
Cardiology Consult    Patient ID: Alan [name]; [id number]; [birth year]   Admit date: 01/23/2018 Date of Consult: 01/23/2018  Primary Care Provider: [facility] [place] [facility] Cardiologist: Carlyle Dolly, MD   Patient Profile    Alan [name] is a 77 y.o. male with past medical history of CAD (s/p CABG in 1996 with LIMA-LAD and SVG-OM1 with cath in 01/2017 showing occluded SVG-OM1 and atretic LIMA with medical therapy recommended), HTN, HLD, Type 2 DM, PVD (s/p bilateral amputations), and ESRD who is being seen today for the evaluation of chest pain at the request of Dr. Thurnell Garbe.   History of Present Illness    [name] was most recently admitted to [facility] from 01/07/2018 to 01/08/2018 for evaluation of chest pain. By review of records he had just been admitted to [facility] days prior for chest pain and had elevated troponin values up to 0.15 which was thought to be secondary to demand ischemia in the setting of his ESRD. At the time of his admission, he described the pain as a dull aching sensation which was similar to his chronic chest discomfort but improved with sublingual nitroglycerin. Cyclic troponin values remained negative and EKG showed no acute ischemic changes. There was concern for episodes of NSVT which led to him being transferred to the stepdown unit but this was thought to be most consistent with artifact and no significant arrhythmias were noted. Imdur was increased from 15 mg daily to 15 mg twice daily and he was informed to follow-up as an outpatient.  He presented back to the ED the following day for recurrent pain which was thought to be atypical for a cardiac etiology as it was reproducible on palpation. Initial and delta troponin values remained negative and he was discharged home.  Came to the ED earlier this morning for recurrent pain which started while at dialysis. By review of notes, he was initially hypotensive with SBP ranging from the 60's to  80's but this was checked through his coat. Following administration of IV fluids, BP improved to 110/70.  He reports having chronic chest pain for over 3 years but feels like his symptoms have acutely worsened over the past several days. He describes this as a constant tightness along his entire left pectoral region which is acutely worse when he turns from side to side or coughs. He is a bilateral amputee and uses a motorized wheelchair so unable to assess exertional symptoms. He reports having baseline dyspnea but denies any recent changes in this.  No recent orthopnea, PND, palpitations, dizziness, or preesyncope. His wife does mention he was recently diagnosed with a UTI by his PCP has not yet started antibiotic therapy.  Initial labs show WBC 10.7, Hgb 10.8, platelets 178, Na+ 133, K+ 4.7, and creatinine 6.01.  AST 12, ALT 11. Initial troponin negative. CXR shows no active cardiopulmonary disease.  EKG shows sinus bradycardia, HR 47, with nonspecific IVCD and peaked T-waves along V2-V3.  He has been in sinus bradycardia by review of telemetry with HR in the mid-40's to 50's. No episodes of CHB or significant pauses thus far.    Past Medical History:  Diagnosis Date  . Arthritis   . Asthma   . Clotted renal dialysis arteriovenous graft ([place])   . Coronary artery disease    a. s/p CABG in 1996 with LIMA-LAD and SVG-OM1 b. 01/2017: cath showing occluded SVG-OM1 and atretic LIMA-LAD. Diffuse disease along 1st Mrg and PDA with medical therapy recommended.   [facility]  CVA (cerebral infarction)   . DDD (degenerative disc disease), cervical   . DDD (degenerative disc disease), lumbar   . Depression   . Diabetes mellitus   . ESRD (end stage renal disease) on dialysis ([place])   . Gangrene of toe ([place]) Feb. 2015   Right  great    . Gastric ulcer    EGD 2014  . Gout   . Hypercholesteremia   . Hypertension   . Sternal wound dehiscence    chronic  . Stroke ([place])   . 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    . Thrombosis of renal dialysis arteriovenous graft [facility])     Past Surgical History:  Procedure Laterality Date  . A/V FISTULAGRAM Left 05/28/2016   Procedure: A/V Fistulagram;  Surgeon: Katha Cabal, MD;  Location: [place] [facility];  Service: Cardiovascular;  Laterality: Left;  . A/V FISTULAGRAM Left 10/28/2016   Procedure: A/V Fistulagram;  Surgeon: Algernon Huxley, MD;  Location: [place] [facility];  Service: Cardiovascular;  Laterality: Left;  . A/V FISTULAGRAM Left 03/03/2017   Procedure: A/V FISTULAGRAM;  Surgeon: Algernon Huxley, MD;  Location: [place] [facility];  Service: Cardiovascular;  Laterality: Left;  . A/V SHUNT INTERVENTION N/A 05/28/2016   Procedure: A/V Shunt Intervention;  Surgeon: Katha Cabal, MD;  Location: [place] [facility];  Service: Cardiovascular;  Laterality: N/A;  . A/V SHUNT INTERVENTION N/A 03/03/2017   Procedure: A/V SHUNT INTERVENTION;  Surgeon: Algernon Huxley, MD;  Location: [place] [facility];  Service: Cardiovascular;  Laterality: N/A;  . AMPUTATION Right 04/10/2013   Procedure: Right Below Knee Amputation;  Surgeon: Newt Minion, MD;  Location: [place];  Service: Orthopedics;  Laterality: Right;  Right Below Knee Amputation  . APPENDECTOMY    . BELOW KNEE LEG AMPUTATION Bilateral   . CHOLECYSTECTOMY    . CORONARY ARTERY BYPASS GRAFT  1996   [place]  . ESOPHAGOGASTRODUODENOSCOPY Left 05/24/2012   JAS:[id number] dilated baggy but otherwise a normal/ Small hiatal hernia. Gastric ulcer -S/P biopsy  . LEFT HEART CATH AND CORS/GRAFTS ANGIOGRAPHY N/A 02/12/2017   Procedure: LEFT HEART CATH AND CORS/GRAFTS ANGIOGRAPHY;  Surgeon: Lorretta Harp, MD;  Location: [place] [facility];  Service: Cardiovascular;  Laterality: N/A;  . PERIPHERAL VASCULAR CATHETERIZATION N/A 08/09/2014   Procedure: A/V Shuntogram/Fistulagram;  Surgeon: Katha Cabal, MD;  Location: [place] [facility];  Service: Cardiovascular;  Laterality: N/A;  . PERIPHERAL VASCULAR  CATHETERIZATION Left 08/09/2014   Procedure: A/V Shunt Intervention;  Surgeon: Katha Cabal, MD;  Location: [place] [facility];  Service: Cardiovascular;  Laterality: Left;     Home Medications:  Prior to Admission medications   Medication Sig Start Date End Date Taking? Authorizing Provider  acetaminophen (TYLENOL) 500 MG tablet Take 1,000 mg by mouth at bedtime. *May take daily if needed but takes mostly at bedtime    [provider]  Amino Acid Infusion (PROSOL) 20 % SOLN Inject 1 vial into the vein once a week. 02/28/17   [provider]  amLODipine (NORVASC) 5 MG tablet Take 5 mg by mouth daily.    [provider]  aspirin EC 81 MG tablet Take 81 mg by mouth daily.     [provider]  atorvastatin (LIPITOR) 20 MG tablet Take 20 mg by mouth at bedtime.  01/02/18   [provider]  B Complex-C-Folic Acid (NEPHRO-VITE PO) Take 1 tablet by mouth at bedtime.     [provider]  budesonide-formoterol (SYMBICORT) 80-4.5 MCG/ACT inhaler Inhale 2 puffs into the lungs 2 (two) times daily as needed (for wheezing/shortness of breath.).    [provider]  cinacalcet (SENSIPAR) 30 MG tablet Take 30 mg by mouth at bedtime.     [provider]  Coenzyme Q10 (COQ10) 100 MG CAPS Take 100 mg by mouth at bedtime.    [provider]  diphenhydrAMINE (BENADRYL) 25 mg capsule Take 25 mg by mouth at bedtime.     [provider]  furosemide (LASIX) 20 MG tablet Take 20 mg by mouth daily.    [provider]  isosorbide mononitrate (IMDUR) 30 MG 24 hr tablet Take 0.5 tablets (15 mg total) by mouth 2 (two) times daily. 01/08/18   Kathie Dike, MD  LANTUS SOLOSTAR 100 UNIT/ML Solostar Pen Inject 9 Units into the skin at bedtime.  12/21/13   [provider]  Liniments (SALONPAS PAIN RELIEF PATCH EX) Place 1 patch onto the skin daily as needed (for pain relief.).     [provider]  lisinopril  (PRINIVIL,ZESTRIL) 5 MG tablet Take 5 mg by mouth daily.    [provider]  magnesium oxide (MAG-OX) 400 MG tablet Take by mouth. 01/02/18   [provider]  metoCLOPramide (REGLAN) 5 MG tablet Take by mouth. 01/02/18   [provider]  metoprolol succinate (TOPROL XL) 25 MG 24 hr tablet Take 0.5 tablets (12.5 mg total) by mouth daily. Patient taking differently: Take 12.5 mg by mouth every Monday, Wednesday, and Friday. In the afternoon after dialysis. 02/13/17 02/13/18  Lavina Hamman, MD  nitroGLYCERIN (NITROSTAT) 0.4 MG SL tablet Place 1 tablet (0.4 mg total) under the tongue every 5 (five) minutes as needed for chest pain. 11/18/14   Samuella Cota, MD  omega-3 acid ethyl esters (LOVAZA) 1 g capsule Take 1 g by mouth daily.    [provider]  omeprazole (PRILOSEC) 20 MG capsule Take 1 capsule by mouth 2 (two) times daily. 03/20/17   [provider]  sevelamer carbonate (RENVELA) 800 MG tablet  01/07/18   [provider]    Inpatient Medications: Scheduled Meds:  Continuous Infusions:  PRN Meds: nitroGLYCERIN  Allergies:    Allergies  Allergen Reactions  . Codeine Itching and Nausea And Vomiting  . [place] Venom [Bee Venom] Swelling    Social History:   Social History   Socioeconomic History  . Marital status: Married    Spouse name: Not on file  . Number of children: Not on file  . Years of education: Not on file  . Highest education level: Not on file  Occupational History  . Not on file  Social Needs  . Financial resource strain: Not on file  . Food insecurity:    Worry: Not on file    Inability: Not on file  . Transportation needs:    Medical: Not on file    Non-medical: Not on file  Tobacco Use  . Smoking status: Former Smoker    Last attempt to quit: 08/08/2004    Years since quitting: 13.4  . Smokeless tobacco: Never Used  Substance and Sexual Activity  . Alcohol use: No  . Drug use: No  .  Sexual activity: Not Currently  Lifestyle  . Physical activity:    Days per week: Not on file    Minutes per session: Not on file  . Stress: Not on file  Relationships  . Social connections:    Talks on phone:  Not on file    Gets together: Not on file    Attends religious service: Not on file    Active member of club or organization: Not on file    Attends meetings of clubs or organizations: Not on file    Relationship status: Not on file  . Intimate partner violence:    Fear of current or ex partner: Not on file    Emotionally abused: Not on file    Physically abused: Not on file    Forced sexual activity: Not on file  Other Topics Concern  . Not on file  Social History Narrative  . Not on file     Family History:    Family History  Problem Relation Age of Onset  . Cancer Mother   . Colon cancer Neg Hx       Review of Systems    General:  No chills, fever, night sweats or weight changes.  Cardiovascular:  No dyspnea on exertion, edema, orthopnea, palpitations, paroxysmal nocturnal dyspnea. Positive for chest pain.  Dermatological: No rash, lesions/masses Respiratory: No cough, dyspnea Urologic: No hematuria, dysuria Abdominal:   No nausea, vomiting, diarrhea, bright red blood per rectum, melena, or hematemesis Neurologic:  No visual changes, wkns, changes in mental status. All other systems reviewed and are otherwise negative except as noted above.  Physical Exam/Data    Vitals:   01/23/18 1212 01/23/18 1230 01/23/18 1300 01/23/18 1315  BP: 110/61 (!) 101/52 (!) 121/53   Pulse: (!) 50 (!) 51  (!) 53  Resp: 17 16 12 19   Temp:      TempSrc:      SpO2: 98% 95%  100%  Weight:       No intake or output data in the 24 hours ending 01/23/18 1322 Filed Weights   01/23/18 1106  Weight: 65.2 kg   Body mass index is 28.07 kg/m.   General: Pleasant, African American male appearing in NAD. Psych: Normal affect. Neuro: Alert and oriented X 3. Moves all  extremities spontaneously. HEENT: Normal  Neck: Supple without bruits or JVD. Lungs:  Resp regular and unlabored, CTA without wheezing or rales. Heart: Regular rhythm, bradycardiac rate. No s3, s4, or murmurs. Abdomen: Soft, non-tender, non-distended, BS + x 4.  Extremities: No clubbing, cyanosis or edema. Bilateral BKA's.    EKG:  The EKG was personally reviewed and demonstrates: Sinus bradycardia, HR 47, with nonspecific IVCD and peaked T-waves along V2-V3.     Labs/Studies     Relevant CV Studies:  Cardiac Catheterization: 01/2017    Mid RCA lesion is 50% stenosed.  2nd RPLB lesion is 90% stenosed.  Ost 1st Mrg to 1st Mrg lesion is 70% stenosed.  Origin lesion is 100% stenosed.  Origin to Prox Graft lesion is 100% stenosed.  The left ventricular systolic function is normal.  LV end diastolic pressure is normal.  The left ventricular ejection fraction is 55-65% by visual estimate.   IMPRESSION: Mr. Drost has occluded grafts including a graft that goes to a circumflex obtuse marginal branch and an atretic LIMA.  He does have a diffusely diseased first marginal branch which probably can be treated medically.  He has normal LV function.  The sheath Chisom be removed and pressure held.  The patient left the lab in stable condition.  Echocardiogram: 01/2017 Study Conclusions  - Left ventricle: The cavity size was normal. There was mild   concentric hypertrophy. Systolic function was normal. The   estimated ejection fraction was in  the range of 55% to 60%. There   is akinesis of the basalinferoseptal myocardium. Features are   consistent with a pseudonormal left ventricular filling pattern,   with concomitant abnormal relaxation and increased filling   pressure (grade 2 diastolic dysfunction). - Aortic valve: Trileaflet; moderately thickened, moderately   calcified leaflets. - Left atrium: Volume/bsa, S: 31.9 ml/m^2.  Echocardiogram: 12/2017 Technically difficult  study due to chest wall/lung interference  Ultrasound enhancing agent utilized to improve endocardial border  definition  Normal left ventricular systolic function, ejection fraction > 55%  Low normal right ventricular systolic function  Aortic sclerosis  Laboratory Data:  Chemistry Recent Labs  Lab 01/23/18 1134  NA 133*  K 4.7  CL 99  CO2 23  GLUCOSE 157*  BUN 48*  CREATININE 6.01*  CALCIUM 8.4*  GFRNONAA 8*  GFRAA 9*  ANIONGAP 11    Recent Labs  Lab 01/23/18 1134  PROT 6.2*  ALBUMIN 2.7*  AST 12*  ALT 11  ALKPHOS 48  BILITOT 0.4   Hematology Recent Labs  Lab 01/23/18 1134  WBC 10.7*  RBC 3.46*  HGB 10.8*  HCT 34.3*  MCV 99.1  MCH 31.2  MCHC 31.5  RDW 13.3  PLT 178   Cardiac Enzymes Recent Labs  Lab 01/23/18 1134  TROPONINI <0.03   No results for input(s): TROPIPOC in the last 168 hours.  BNPNo results for input(s): BNP, PROBNP in the last 168 hours.  DDimer No results for input(s): DDIMER in the last 168 hours.  Radiology/Studies:  Dg Chest Port 1 View  Result Date: 01/23/2018 CLINICAL DATA:  Chest pain for several weeks EXAM: PORTABLE CHEST 1 VIEW COMPARISON:  Chest x-ray dated 03/06/2017. FINDINGS: Heart size and mediastinal contours are stable. Heart size is upper normal, stable. Lungs are clear. Stable chronic elevation of the LEFT hemidiaphragm. No pleural effusion or pneumothorax seen. Advanced degenerative change at the LEFT shoulder. No acute appearing osseous abnormality. IMPRESSION: No active disease. No evidence of pneumonia or pulmonary edema. Electronically Signed   By: Franki Cabot M.D.   On: 01/23/2018 11:35     Assessment & Plan    1. Atypical Chest Pain in the Setting of Known CAD - he is s/p CABG in 1996 with LIMA-LAD and SVG-OM1. Most recent catheterization in 01/2017 showed occluded SVG-OM1 and atretic LIMA with medical therapy recommended as outlined above. - He has experienced recurrent admissions for atypical chest  pain and his pain does seem atypical by history as he reports it has been constant for over 3 years and is worse with coughing or turning from side-to-side. His pain is also reproducible upon examination. - Initial troponin negative. EKG shows sinus bradycardia, HR 47, with nonspecific IVCD and peaked T-waves along V2-V3. Would continue to cycle cardiac enzymes.  - continue ASA, statin, and Imdur. Would hold Toprol-XL given current HR in the 40's. If enzymes remain negative or flat, would not anticipate a repeat catheterization given his atypical symptoms. Could consider a repeat limited echo for reassessment of EF but this was also just performed in 12/2017 and showed EF remained preserved. Would consider further titration of Imdur pending BP this admission. MD to see later today.   2. Bradycardia - telemetry shows HR in the mid-40's to 50's. No episodes of CHB or significant pauses thus far. This is a chronic issue for the patient but his wife notes he has been more fatigued and is concerned this is a contributing factor. Would hold Toprol-XL 12.5mg  daily at  the time of admission and follow on telemetry.  3. HTN - Initial reports of SBP being in the 60s to 80s while at hemodialysis but this was checked through his coat, therefore doubt the accuracy of these readings. BP has been stable at 101/52 - 131/69 while in the ED. He is on Amlodipine 5mg  daily, Imdur 15mg  BID, Lasix 20mg  daily, Lisinopril 5mg  daily, and Toprol-XL 12.5mg  daily. Would hold Amlodipine, Lisinopril, and Toprol-XL at the time of admission.   4. HLD - Followed by PCP. Goal LDL is less than 70 in the setting of known CAD. He remains on Atorvastatin 20mg  daily.   5. Type 2 DM - no recent Hgb A1c on file. By review of Epic, this was at 6.7 in 01/2017.  - per admitting team.   6. PVD - s/p bilateral BKA's. Uses a motorized wheelchair.   7. ESRD - on HD - MWF schedule. [place] need Nephrology consult to arrange for HD during  admission.    For questions or updates, please contact [place] Please consult [email or web address] for contact info under Cardiology/STEMI.  Signed, Erma Heritage, PA-C 01/23/2018, 1:22 PM Pager: [phone]

## 2018-01-23 NOTE — H&P (Signed)
History and Physical  Alan [name] DUK:[id number] DOB: 1940-02-27 DOA: 01/23/2018   PCP: [facility]   Patient coming from: Home  Chief Complaint: chest pain  HPI:  Alan [name] is a 77 y.o. male with medical history of ESRD, coronary disease, diabetes mellitus, stroke presenting with chest pain that began the morning of 01/23/2018 as he was preparing to go to dialysis.  Patient was sitting in chair when he developed left-sided chest pressure with some associated shortness of breath.  The patient was recently admitted to [facility] on 01/08/2018 for similar presentation.  He was seen by cardiology at that time.  Isosorbide was increased to twice daily, and the patient was discharged in stable condition with instructions to follow-up with his primary cardiologist.  Prior to that admission, the patient was also admitted to [facility] after transferred from [facility] secondary to chest pain.  It was thought to be demand ischemia from his HD, and the patient was managed medically.  Echocardiogram at that time showed LVEF greater than 55%.  The patient himself denies any fevers, chills, headache, chest pain, dizziness, syncope.  He had one episode of nausea and vomiting yesterday which has resolved. In the emergency department, the patient was afebrile hemodynamically stable, saturating 100% on room air.  Hepatic panel was unremarkable.  CBC essentially unremarkable with hemoglobin at baseline and WBC 10.7.  EKG shows sinus bradycardia without ST ST wave changes.  Initial troponin was negative.  Chest x-ray was negative for acute findings..  The patient was given nitroglycerin sublingual with relief of his chest discomfort.  The patient was discussed with cardiology, Dr. Margaretann Loveless.  Because there was no cardiology at [facility], and because of concerns of angina and recurrent chest pain, the patient is being transferred for further cardiac work-up.  At the time of my interview, the patient  is chest pain-free.  BMP showed a potassium 4.7.  Assessment/Plan: Chest pain in the setting of coronary artery disease -Patient has long history of developing chest pain particular during dialysis -He has had continued chest discomfort this past week on dialysis, but has not mentioned it to anyone. -01/2017-Cath showed  Mid RCA 50%, RPLB second branch 90%, OM1 70%, LCX 100%. Occluded LIMA-LAD, occluded SV-OM1. Recs for medical therapy of his OM disease -Cycle troponins -EKG without concerning ischemic changes -Chest x-ray negative for acute findings -The patient's bradycardia and soft blood pressures have limited titration of his metoprolol -Continue Imdur -At [facility] it was determined that the risk of undergoing LHC outweighed the benefits and that procedure would most likely not improve QOL  ESRD -Patient dialyzes Monday, Wednesday, Fridays -Potassium is 4.7 without any signs of fluid overload -Nephrology (Dr. Hollie Salk) contacted and notified of the patient's transfer  Essential hypertension -Holding metoprolol and lisinopril secondary to patient's soft blood pressure  Diabetes mellitus type 2 -Patient has not been taking Lantus for nearly 2 months -NovoLog sliding scale -Check hemoglobin A1c  History of stroke -Continue aspirin  Hyperlipidemia -Continue statin      Past Medical History:  Diagnosis Date  . Arthritis   . Asthma   . Clotted renal dialysis arteriovenous graft ([place])   . Coronary artery disease    a. s/p CABG in 1996 with LIMA-LAD and SVG-OM1 b. 01/2017: cath showing occluded SVG-OM1 and atretic LIMA-LAD. Diffuse disease along 1st Mrg and PDA with medical therapy recommended.   . CVA (cerebral infarction)   . DDD (degenerative disc disease), cervical   .  DDD (degenerative disc disease), lumbar   . Depression   . Diabetes mellitus   . ESRD (end stage renal disease) on dialysis ([place])   . Gangrene of toe ([place]) Feb. 2015   Right  great    . Gastric ulcer     EGD 2014  . Gout   . Hypercholesteremia   . Hypertension   . Sternal wound dehiscence    chronic  . Stroke ([place])   . 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 Thrombosis of renal dialysis arteriovenous graft [facility])    Past Surgical History:  Procedure Laterality Date  . A/V FISTULAGRAM Left 05/28/2016   Procedure: A/V Fistulagram;  Surgeon: Katha Cabal, MD;  Location: [place] CV LAB;  Service: Cardiovascular;  Laterality: Left;  . A/V FISTULAGRAM Left 10/28/2016   Procedure: A/V Fistulagram;  Surgeon: Algernon Huxley, MD;  Location: [place] CV LAB;  Service: Cardiovascular;  Laterality: Left;  . A/V FISTULAGRAM Left 03/03/2017   Procedure: A/V FISTULAGRAM;  Surgeon: Algernon Huxley, MD;  Location: [place] CV LAB;  Service: Cardiovascular;  Laterality: Left;  . A/V SHUNT INTERVENTION N/A 05/28/2016   Procedure: A/V Shunt Intervention;  Surgeon: Katha Cabal, MD;  Location: [place] CV LAB;  Service: Cardiovascular;  Laterality: N/A;  . A/V SHUNT INTERVENTION N/A 03/03/2017   Procedure: A/V SHUNT INTERVENTION;  Surgeon: Algernon Huxley, MD;  Location: [place] CV LAB;  Service: Cardiovascular;  Laterality: N/A;  . AMPUTATION Right 04/10/2013   Procedure: Right Below Knee Amputation;  Surgeon: Newt Minion, MD;  Location: [place];  Service: Orthopedics;  Laterality: Right;  Right Below Knee Amputation  . APPENDECTOMY    . BELOW KNEE LEG AMPUTATION Bilateral   . CHOLECYSTECTOMY    . CORONARY ARTERY BYPASS GRAFT  1996   [place]  . ESOPHAGOGASTRODUODENOSCOPY Left 05/24/2012   GUR:KYHCWCBJ dilated baggy but otherwise a normal/ Small hiatal hernia. Gastric ulcer -S/P biopsy  . LEFT HEART CATH AND CORS/GRAFTS ANGIOGRAPHY N/A 02/12/2017   Procedure: LEFT HEART CATH AND CORS/GRAFTS ANGIOGRAPHY;  Surgeon: Lorretta Harp, MD;  Location: [place] CV LAB;  Service: Cardiovascular;  Laterality: N/A;  . PERIPHERAL VASCULAR CATHETERIZATION N/A 08/09/2014   Procedure: A/V  Shuntogram/Fistulagram;  Surgeon: Katha Cabal, MD;  Location: [place] CV LAB;  Service: Cardiovascular;  Laterality: N/A;  . PERIPHERAL VASCULAR CATHETERIZATION Left 08/09/2014   Procedure: A/V Shunt Intervention;  Surgeon: Katha Cabal, MD;  Location: [place] CV LAB;  Service: Cardiovascular;  Laterality: Left;   Social History:  reports that he quit smoking about 13 years ago. He has never used smokeless tobacco. He reports that he does not drink alcohol or use drugs.   Family History  Problem Relation Age of Onset  . Cancer Mother   . Colon cancer Neg Hx      Allergies  Allergen Reactions  . Codeine Itching and Nausea And Vomiting  . [place] Venom [Bee Venom] Swelling     Prior to Admission medications   Medication Sig Start Date End Date Taking? Authorizing Provider  acetaminophen (TYLENOL) 500 MG tablet Take 1,000 mg by mouth at bedtime. *May take daily if needed but takes mostly at bedtime    [provider]  Amino Acid Infusion (PROSOL) 20 % SOLN Inject 1 vial into the vein once a week. 02/28/17   [provider]  amLODipine (NORVASC) 5 MG tablet Take 5 mg by mouth daily.    [provider]  aspirin  EC 81 MG tablet Take 81 mg by mouth daily.     [provider]  atorvastatin (LIPITOR) 20 MG tablet Take 20 mg by mouth at bedtime.  01/02/18   [provider]  B Complex-C-Folic Acid (NEPHRO-VITE PO) Take 1 tablet by mouth at bedtime.     [provider]  budesonide-formoterol (SYMBICORT) 80-4.5 MCG/ACT inhaler Inhale 2 puffs into the lungs 2 (two) times daily as needed (for wheezing/shortness of breath.).    [provider]  cinacalcet (SENSIPAR) 30 MG tablet Take 30 mg by mouth at bedtime.     [provider]  Coenzyme Q10 (COQ10) 100 MG CAPS Take 100 mg by mouth at bedtime.    [provider]  diphenhydrAMINE (BENADRYL) 25 mg capsule Take 25 mg by mouth at bedtime.      [provider]  furosemide (LASIX) 20 MG tablet Take 20 mg by mouth daily.    [provider]  isosorbide mononitrate (IMDUR) 30 MG 24 hr tablet Take 0.5 tablets (15 mg total) by mouth 2 (two) times daily. 01/08/18   Kathie Dike, MD  LANTUS SOLOSTAR 100 UNIT/ML Solostar Pen Inject 9 Units into the skin at bedtime.  12/21/13   [provider]  Liniments (SALONPAS PAIN RELIEF PATCH EX) Place 1 patch onto the skin daily as needed (for pain relief.).     [provider]  lisinopril (PRINIVIL,ZESTRIL) 5 MG tablet Take 5 mg by mouth daily.    [provider]  magnesium oxide (MAG-OX) 400 MG tablet Take by mouth. 01/02/18   [provider]  metoCLOPramide (REGLAN) 5 MG tablet Take by mouth. 01/02/18   [provider]  metoprolol succinate (TOPROL XL) 25 MG 24 hr tablet Take 0.5 tablets (12.5 mg total) by mouth daily. Patient taking differently: Take 12.5 mg by mouth every Monday, Wednesday, and Friday. In the afternoon after dialysis. 02/13/17 02/13/18  Lavina Hamman, MD  nitroGLYCERIN (NITROSTAT) 0.4 MG SL tablet Place 1 tablet (0.4 mg total) under the tongue every 5 (five) minutes as needed for chest pain. 11/18/14   Samuella Cota, MD  omega-3 acid ethyl esters (LOVAZA) 1 g capsule Take 1 g by mouth daily.    [provider]  omeprazole (PRILOSEC) 20 MG capsule Take 1 capsule by mouth 2 (two) times daily. 03/20/17   [provider]  sevelamer carbonate (RENVELA) 800 MG tablet  01/07/18   [provider]    Review of Systems:  Constitutional:  No weight loss, night sweats, Fevers, chills, fatigue.  Head&Eyes: No headache.  No vision loss.  No eye pain or scotoma ENT:  No Difficulty swallowing,Tooth/dental problems,Sore throat,  No ear ache, post nasal drip,  Cardio-vascular:  No Orthopnea, PND, swelling in lower extremities,  dizziness, palpitations  GI:  No  abdominal pain, nausea, vomiting,  diarrhea, loss of appetite, hematochezia, melena, heartburn, indigestion, Resp:  No shortness of breath with exertion or at rest. No cough. No coughing up of blood .No wheezing.No chest wall deformity  Skin:  no rash or lesions.  GU:  no dysuria, change in color of urine, no urgency or frequency. No flank pain.  Musculoskeletal:  No joint pain or swelling. No decreased range of motion. No back pain.  Psych:  No change in mood or affect. No depression or anxiety. Neurologic: No headache, no dysesthesia, no focal weakness, no vision loss. No syncope  Physical Exam: Vitals:   01/23/18 1212 01/23/18 1230 01/23/18 1300 01/23/18 1315  BP: 110/61 (!) 101/52 (!) 121/53   Pulse: (!) 50 (!) 51  (!) 53  Resp: 17 16 12 19   Temp:      TempSrc:      SpO2: 98% 95%  100%  Weight:       General:  A&O x 3, NAD, nontoxic, pleasant/cooperative Head/Eye: No conjunctival hemorrhage, no icterus, [place]/AT, No nystagmus ENT:  No icterus,  No thrush, good dentition, no pharyngeal exudate Neck:  No masses, no lymphadenpathy, no bruits CV:  RRR, no rub, no gallop, no S3 Lung:  CTAB, good air movement, no wheeze, no rhonchi Abdomen: soft/NT, +BS, nondistended, no peritoneal signs Ext: No cyanosis, No rashes, No petechiae, No lymphangitis, No edema; 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 No results for input(s): AMMONIA in the last 168 hours. CBC: Recent Labs  Lab 01/23/18 1134  WBC 10.7*  NEUTROABS 7.3  HGB 10.8*  HCT 34.3*  MCV 99.1  PLT 178   Coagulation Profile: No results for input(s): INR, PROTIME in the last 168 hours. Cardiac  Enzymes: Recent Labs  Lab 01/23/18 1134  TROPONINI <0.03   BNP: Invalid input(s): POCBNP CBG: No results for input(s): GLUCAP in the last 168 hours. Urine analysis:    Component Value Date/Time   COLORURINE YELLOW 06/14/2016 2314   APPEARANCEUR CLOUDY (A) 06/14/2016 2314   LABSPEC 1.009 06/14/2016 2314   PHURINE 7.0 06/14/2016 2314   GLUCOSEU NEGATIVE 06/14/2016 2314   HGBUR SMALL (A) 06/14/2016 2314   BILIRUBINUR NEGATIVE 06/14/2016 2314   KETONESUR NEGATIVE 06/14/2016 2314   PROTEINUR 100 (A) 06/14/2016 2314   UROBILINOGEN 4.0 (H) 11/05/2014 1930   NITRITE NEGATIVE 06/14/2016 2314   LEUKOCYTESUR LARGE (A) 06/14/2016 2314   Sepsis Labs: @LABRCNTIP (procalcitonin:4,lacticidven:4) )No results found for this or any previous visit (from the past 240 hour(s)).   Radiological Exams on Admission: Dg Chest Port 1 View  Result Date: 01/23/2018 CLINICAL DATA:  Chest pain for several weeks EXAM: PORTABLE CHEST 1 VIEW COMPARISON:  Chest x-ray dated 03/06/2017. FINDINGS: Heart size and mediastinal contours are stable. Heart size is upper normal, stable. Lungs are clear. Stable chronic elevation of the LEFT hemidiaphragm. No pleural effusion or pneumothorax seen. Advanced degenerative change at the LEFT shoulder. No acute appearing osseous abnormality. IMPRESSION: No active disease. No evidence of pneumonia or pulmonary edema. Electronically Signed   By: Franki Cabot M.D.   On: 01/23/2018 11:35    EKG: Independently reviewed. Sinus brady, no STT changes    Time spent:60 minutes Code Status:   FULL Family Communication:  No Family at bedside Disposition Plan: expect 1-2 day hospitalization Consults called: cardiology Margaretann Loveless); renal Hollie Salk) DVT Prophylaxis: [place] Heparin     Orson Eva, DO  [facility] Pager [phone]  If 7PM-7AM, please contact night-coverage [email or web address] Password [email or web address] 01/23/2018, 1:42 PM

## 2018-01-24 DIAGNOSIS — E782 Mixed hyperlipidemia: Secondary | ICD-10-CM | POA: Diagnosis not present

## 2018-01-24 DIAGNOSIS — I739 Peripheral vascular disease, unspecified: Secondary | ICD-10-CM

## 2018-01-24 DIAGNOSIS — I25708 Atherosclerosis of coronary artery bypass graft(s), unspecified, with other forms of angina pectoris: Secondary | ICD-10-CM

## 2018-01-24 DIAGNOSIS — I120 Hypertensive chronic kidney disease with stage 5 chronic kidney disease or end stage renal disease: Secondary | ICD-10-CM | POA: Diagnosis not present

## 2018-01-24 DIAGNOSIS — I953 Hypotension of hemodialysis: Secondary | ICD-10-CM | POA: Diagnosis not present

## 2018-01-24 DIAGNOSIS — R079 Chest pain, unspecified: Secondary | ICD-10-CM | POA: Diagnosis not present

## 2018-01-24 DIAGNOSIS — R0789 Other chest pain: Secondary | ICD-10-CM | POA: Diagnosis not present

## 2018-01-24 DIAGNOSIS — E1122 Type 2 diabetes mellitus with diabetic chronic kidney disease: Secondary | ICD-10-CM | POA: Diagnosis not present

## 2018-01-24 DIAGNOSIS — I10 Essential (primary) hypertension: Secondary | ICD-10-CM

## 2018-01-24 DIAGNOSIS — N186 End stage renal disease: Secondary | ICD-10-CM | POA: Diagnosis not present

## 2018-01-24 DIAGNOSIS — R001 Bradycardia, unspecified: Secondary | ICD-10-CM

## 2018-01-24 DIAGNOSIS — I208 Other forms of angina pectoris: Secondary | ICD-10-CM | POA: Diagnosis not present

## 2018-01-24 LAB — RENAL FUNCTION PANEL
Albumin: 2.4 g/dL — ABNORMAL LOW (ref 3.5–5.0)
Anion gap: 15 (ref 5–15)
BUN: 55 mg/dL — AB (ref 8–23)
CO2: 22 mmol/L (ref 22–32)
Calcium: 8.5 mg/dL — ABNORMAL LOW (ref 8.9–10.3)
Chloride: 98 mmol/L (ref 98–111)
Creatinine, Ser: 7.03 mg/dL — ABNORMAL HIGH (ref 0.61–1.24)
GFR calc Af Amer: 8 mL/min — ABNORMAL LOW (ref >60)
GFR calc non Af Amer: 7 mL/min — ABNORMAL LOW (ref >60)
Glucose, Bld: 105 mg/dL — ABNORMAL HIGH (ref 70–99)
Phosphorus: 4.7 mg/dL — ABNORMAL HIGH (ref 2.5–4.6)
Potassium: 4.7 mmol/L (ref 3.5–5.1)
Sodium: 135 mmol/L (ref 135–145)

## 2018-01-24 LAB — GLUCOSE, CAPILLARY
Glucose-Capillary: 118 mg/dL — ABNORMAL HIGH (ref 70–99)
Glucose-Capillary: 146 mg/dL — ABNORMAL HIGH (ref 70–99)
Glucose-Capillary: 178 mg/dL — ABNORMAL HIGH (ref 70–99)
Glucose-Capillary: 84 mg/dL (ref 70–99)

## 2018-01-24 LAB — CBC
HCT: 30.9 % — ABNORMAL LOW (ref 39.0–52.0)
Hemoglobin: 9.5 g/dL — ABNORMAL LOW (ref 13.0–17.0)
MCH: 30.3 pg (ref 26.0–34.0)
MCHC: 30.7 g/dL (ref 30.0–36.0)
MCV: 98.4 fL (ref 80.0–100.0)
Platelets: 202 10*3/uL (ref 150–400)
RBC: 3.14 MIL/uL — ABNORMAL LOW (ref 4.22–5.81)
RDW: 13.3 % (ref 11.5–15.5)
WBC: 8.1 10*3/uL (ref 4.0–10.5)
nRBC: 0 % (ref 0.0–0.2)

## 2018-01-24 LAB — TROPONIN I

## 2018-01-24 MED ORDER — PENTAFLUOROPROP-TETRAFLUOROETH EX AERO: 1.0000 "application " | INHALATION_SPRAY | CUTANEOUS | Status: DC | PRN

## 2018-01-24 MED ORDER — MIDODRINE HCL 5 MG PO TABS
ORAL_TABLET | ORAL | Status: AC
  Administered 2018-01-24: 5 mg via ORAL
  Filled 2018-01-24: qty 1

## 2018-01-24 MED ORDER — ALBUMIN HUMAN 25 % IV SOLN
INTRAVENOUS | Status: AC
  Administered 2018-01-24: 25 g via INTRAVENOUS
  Filled 2018-01-24: qty 100

## 2018-01-24 MED ORDER — SODIUM CHLORIDE 0.9 % IV SOLN: 100.0000 mL | INTRAVENOUS | Status: DC | PRN

## 2018-01-24 MED ORDER — ALBUMIN HUMAN 25 % IV SOLN
25.0000 g | Freq: Once | INTRAVENOUS | Status: AC
  Administered 2018-01-24: 25 g via INTRAVENOUS

## 2018-01-24 MED ORDER — ALTEPLASE 2 MG IJ SOLR: 2.0000 mg | Freq: Once | INTRAMUSCULAR | Status: DC | PRN

## 2018-01-24 MED ORDER — LIDOCAINE-PRILOCAINE 2.5-2.5 % EX CREA
1.0000 "application " | TOPICAL_CREAM | CUTANEOUS | Status: DC | PRN
  Filled 2018-01-24: qty 5

## 2018-01-24 MED ORDER — MIDODRINE HCL 5 MG PO TABS
5.0000 mg | ORAL_TABLET | Freq: Once | ORAL | Status: AC
  Administered 2018-01-24 – 2018-01-25 (×3): 5 mg via ORAL

## 2018-01-24 MED ORDER — LIDOCAINE HCL (PF) 1 % IJ SOLN: 5.0000 mL | INTRAMUSCULAR | Status: DC | PRN

## 2018-01-24 MED ORDER — HEPARIN SODIUM (PORCINE) 1000 UNIT/ML DIALYSIS
1000.0000 [IU] | INTRAMUSCULAR | Status: DC | PRN
  Filled 2018-01-24: qty 1

## 2018-01-24 MED ORDER — CHLORHEXIDINE GLUCONATE CLOTH 2 % EX PADS
6.0000 | MEDICATED_PAD | Freq: Every day | CUTANEOUS | Status: DC
  Administered 2018-01-24 – 2018-01-25 (×2): 6 via TOPICAL

## 2018-01-24 NOTE — Progress Notes (Signed)
PROGRESS NOTE    Patient: Alan [name]                            PCP: [facility] [place]                    DOB: Jan 21, 1941            DOA: 01/23/2018 [id number]             DOS: 01/24/2018, 3:51 PM   LOS: 0 days   Date of Service: The patient was seen and examined on 01/24/2018  Subjective:   Patient was seen and examined this morning, still having some chest discomfort.  But otherwise much better this admission.  No issues overnight.  Stable  Patient he was not dialyzed yesterday.  He was not told that he was going to get dialyzed today.  Brief Narrative:   Alan [name] is a 77 y.o. male with medical history of ESRD, coronary disease, diabetes mellitus, stroke presenting with chest pain that began the morning of 01/23/2018 as he was preparing to go to dialysis.  Patient was sitting in chair when he developed left-sided chest pressure with some associated shortness of breath.  The patient was recently admitted to [facility] on 01/08/2018 for similar presentation.  He was seen by cardiology at that time.  Isosorbide was increased to twice daily, and the patient was discharged in stable condition with instructions to follow-up with his primary cardiologist.  Prior to that admission, the patient was also admitted to [facility] after transferred from [facility] secondary to chest pain.  It was thought to be demand ischemia from his HD, and the patient was managed medically.  Echocardiogram at that time showed LVEF greater than 55%.  The patient himself denies any fevers, chills, headache, chest pain, dizziness, syncope.  He had one episode of nausea and vomiting yesterday which has resolved. In the emergency department, the patient was afebrile hemodynamically stable, saturating 100% on room air.  Hepatic panel was unremarkable.  CBC essentially unremarkable with hemoglobin at baseline and WBC 10.7.  EKG shows sinus bradycardia without ST ST wave changes.  Initial troponin was negative.   Chest x-ray was negative for acute findings..  The patient was given nitroglycerin sublingual with relief of his chest discomfort.  The patient was discussed with cardiology, Dr. Margaretann Loveless.  Because there was no cardiology at [facility], and because of concerns of angina and recurrent chest pain, the patient is being transferred for further cardiac work-up.  At the time of my interview, the patient is chest pain-free.  BMP showed a potassium 4.7.   Active Problems:   ESRD (end stage renal disease) (HCC)   HLD (hyperlipidemia)   Subclinical hyperthyroidism   Angina at rest [facility])   Hemodialysis-associated hypotension    Assessment & Plan:     Chest pain in the setting of coronary artery disease -Long history of angina especially during dialysis -Cardiac enzymes were negative, nonspecific changes on EKG, possibly chronic, chest x-ray within normal limits -Chronic bradycardia >>> cardiology status is stopped his Toprol-XL  -01/2017-Cath showed Mid RCA 50%, RPLB second branch 90%, OM1 70%, LCX 100%. Occluded LIMA-LAD, occluded SV-OM1. Recs for medical therapy of his OM disease  -Cardiology consulted; recommended continue aspirin, statin, Imdur, stop Toprol-XL  -The patient's bradycardia and soft blood pressures have limited titration of his metoprolol -Continue Imdur -At [facility] it was determined that the  risk of undergoing LHC outweighed the benefits and that procedure would most likely not improve QOL  -Radiology signed off, plan for further evaluation including cardiac catheterization.  ESRD -Patient dialyzes Monday, Wednesday, Fridays -Potassium is 4.7 without any signs of fluid overload -Nephrology (Dr. Hollie Salk) contacted and notified --> scheduled for hemodialysis today 01/24/2018  Essential hypertension -Holding metoprolol and lisinopril secondary to patient's soft blood pressure  Diabetes mellitus type 2 -Patient has not been taking Lantus for nearly 2 months -NovoLog sliding  scale -Check hemoglobin A1c  History of stroke -Continue aspirin  Hyperlipidemia -Continue statin   DVT prophylaxis: SCD/Compression stockings  Code Status:   Code Status: Full Code  Family Communication:  The above findings and plan of care has been discussed with patient and family in detail, they expressed understanding and agreement of above.  Disposition Plan: In a.m. if stable  Consultants: Cardiology/nephrology  Procedures:   No admission procedures for hospital encounter.     Antimicrobials:  Anti-infectives (From admission, onward)   None       Medication:  . aspirin EC  81 mg Oral Daily  . atorvastatin  20 mg Oral QHS  . Chlorhexidine Gluconate Cloth  6 each Topical Q0600  . cinacalcet  30 mg Oral QHS  . furosemide  20 mg Oral Daily  . heparin  5,000 Units Subcutaneous Q8H  . insulin aspart  0-5 Units Subcutaneous QHS  . insulin aspart  0-9 Units Subcutaneous TID WC  . isosorbide mononitrate  15 mg Oral BID  . multivitamin  1 tablet Oral QHS    sodium chloride, sodium chloride, acetaminophen **OR** acetaminophen, heparin, lidocaine (PF), lidocaine-prilocaine, nitroGLYCERIN, ondansetron **OR** ondansetron (ZOFRAN) IV, pentafluoroprop-tetrafluoroeth     Objective:   Vitals:   01/23/18 2000 01/24/18 0427 01/24/18 1137 01/24/18 1203  BP: (!) 140/54 (!) 100/53 (!) 110/54 (!) 112/49  Pulse:  (!) 51 (!) 47   Resp:   20   Temp:  97.7 F (36.5 C) 98.6 F (37 C)   TempSrc:  Oral Oral   SpO2:  98% (!) 85%   Weight:  61.3 kg      Intake/Output Summary (Last 24 hours) at 01/24/2018 1551 Last data filed at 01/24/2018 1000 Gross per 24 hour  Intake 120 ml  Output -  Net 120 ml   Filed Weights   01/23/18 1106 01/24/18 0427  Weight: 65.2 kg 61.3 kg     Examination:    General exam: Appears calm and comfortable  BP (!) 112/49   Pulse (!) 47   Temp 98.6 F (37 C) (Oral)   Resp 20   Wt 61.3 kg   SpO2 (!) 85%   BMI 26.39 kg/m      Physical Exam  Constitution:  Alert, cooperative, no distress,  Psychiatric: Normal and stable mood and affect, cognition intact,   HEENT: Normocephalic, PERRL, otherwise with in Normal limits  Chest:Chest symmetric Cardio vascular:  S1/S2, RRR, No murmure, No Rubs or Gallops  pulmonary: Clear to auscultation bilaterally, respirations unlabored, negative wheezes / crackles Abdomen: Soft, non-tender, non-distended, bowel sounds,no masses, no organomegaly Muscular skeletal: Limited exam - in bed, able to move all 4 extremities, Normal strength,  Neuro: CNII-XII intact. , normal motor and sensation, reflexes intact  Extremities:  Lateral BKA, no edema,  upper extremity +2 pulses  Skin: Dry, warm to touch, negative for any Rashes, No open wounds Wounds: per nursing documentation  LABs:  CBC Latest Ref Rng & Units 01/23/2018 01/09/2018 01/07/2018  WBC  4.0 - 10.5 K/uL 10.7(H) 7.6 7.6  Hemoglobin 13.0 - 17.0 g/dL 10.8(L) 10.6(L) 11.0(L)  Hematocrit 39.0 - 52.0 % 34.3(L) 33.7(L) 34.6(L)  Platelets 150 - 400 K/uL 178 182 171   CMP Latest Ref Rng & Units 01/24/2018 01/23/2018 01/09/2018  Glucose 70 - 99 mg/dL 105(H) 157(H) 139(H)  BUN 8 - 23 mg/dL 55(H) 48(H) 62(H)  Creatinine 0.61 - 1.24 mg/dL 7.03(H) 6.01(H) 5.77(H)  Sodium 135 - 145 mmol/L 135 133(L) 134(L)  Potassium 3.5 - 5.1 mmol/L 4.7 4.7 4.9  Chloride 98 - 111 mmol/L 98 99 99  CO2 22 - 32 mmol/L 22 23 23   Calcium 8.9 - 10.3 mg/dL 8.5(L) 8.4(L) 8.3(L)  Total Protein 6.5 - 8.1 g/dL - 6.2(L) -  Total Bilirubin 0.3 - 1.2 mg/dL - 0.4 -  Alkaline Phos 38 - 126 U/L - 48 -  AST 15 - 41 U/L - 12(L) -  ALT 0 - 44 U/L - 11 -

## 2018-01-24 NOTE — Consult Note (Signed)
Reason for Consult: To manage dialysis and dialysis related needs Referring Physician: Dr. Roger Shelter  Alan [name] is an 77 y.o. male.  HPI: Pt is a 33M with ESRD on HD MWF at [facility], DM II, CAD s/p CABG, bilateral BKAs, and h/o stroke who is now seen in consultation for provision of dialysis and management of ESRD.    Pt was transferred from [facility] for eval of CP and possible cardiac cath.  Pt was getting ready to go to dialysis yesterday when he developed CP.  He went to ED for evaluation.  His EKG was sig for sinus bradycardia without ST/T wave changes and his trops were neg.  Was given NTG x 1 with relief.  Of note, he's been admitted multiple times for CP before.  He was transferred to [facility] for possibility of cardiac cath.    Today, pt feels well and is CP free.  Has no complaints.    Alan [name], records pending  Past Medical History:  Diagnosis Date  . Arthritis   . Asthma   . Clotted renal dialysis arteriovenous graft ([place])   . Coronary artery disease    a. s/p CABG in 1996 with LIMA-LAD and SVG-OM1 b. 01/2017: cath showing occluded SVG-OM1 and atretic LIMA-LAD. Diffuse disease along 1st Mrg and PDA with medical therapy recommended.   . CVA (cerebral infarction)   . DDD (degenerative disc disease), cervical   . DDD (degenerative disc disease), lumbar   . Depression   . Diabetes mellitus   . ESRD (end stage renal disease) on dialysis ([place])   . Gangrene of toe ([place]) Feb. 2015   Right  great    . Gastric ulcer    EGD 2014  . Gout   . Hypercholesteremia   . Hypertension   . Sternal wound dehiscence    chronic  . Stroke ([place])   . 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 Thrombosis of renal dialysis arteriovenous graft [facility])     Past Surgical History:  Procedure Laterality Date  . A/V FISTULAGRAM Left 05/28/2016   Procedure: A/V Fistulagram;  Surgeon: Katha Cabal, MD;  Location: [place] CV LAB;  Service: Cardiovascular;  Laterality: Left;  . A/V  FISTULAGRAM Left 10/28/2016   Procedure: A/V Fistulagram;  Surgeon: Algernon Huxley, MD;  Location: [place] CV LAB;  Service: Cardiovascular;  Laterality: Left;  . A/V FISTULAGRAM Left 03/03/2017   Procedure: A/V FISTULAGRAM;  Surgeon: Algernon Huxley, MD;  Location: [place] CV LAB;  Service: Cardiovascular;  Laterality: Left;  . A/V SHUNT INTERVENTION N/A 05/28/2016   Procedure: A/V Shunt Intervention;  Surgeon: Katha Cabal, MD;  Location: [place] CV LAB;  Service: Cardiovascular;  Laterality: N/A;  . A/V SHUNT INTERVENTION N/A 03/03/2017   Procedure: A/V SHUNT INTERVENTION;  Surgeon: Algernon Huxley, MD;  Location: [place] [facility];  Service: Cardiovascular;  Laterality: N/A;  . AMPUTATION Right 04/10/2013   Procedure: Right Below Knee Amputation;  Surgeon: Newt Minion, MD;  Location: [place];  Service: Orthopedics;  Laterality: Right;  Right Below Knee Amputation  . APPENDECTOMY    . BELOW KNEE LEG AMPUTATION Bilateral   . CHOLECYSTECTOMY    . CORONARY ARTERY BYPASS GRAFT  1996   [place]  . ESOPHAGOGASTRODUODENOSCOPY Left 05/24/2012   VQQ:[id number] dilated baggy but otherwise a normal/ Small hiatal hernia. Gastric ulcer -S/P biopsy  . LEFT HEART CATH AND CORS/GRAFTS ANGIOGRAPHY N/A 02/12/2017   Procedure: LEFT HEART CATH AND CORS/GRAFTS ANGIOGRAPHY;  Surgeon: Gwenlyn Found,  Pearletha Forge, MD;  Location: [place] CV LAB;  Service: Cardiovascular;  Laterality: N/A;  . PERIPHERAL VASCULAR CATHETERIZATION N/A 08/09/2014   Procedure: A/V Shuntogram/Fistulagram;  Surgeon: Katha Cabal, MD;  Location: [place] CV LAB;  Service: Cardiovascular;  Laterality: N/A;  . PERIPHERAL VASCULAR CATHETERIZATION Left 08/09/2014   Procedure: A/V Shunt Intervention;  Surgeon: Katha Cabal, MD;  Location: [place] CV LAB;  Service: Cardiovascular;  Laterality: Left;    Family History  Problem Relation Age of Onset  . Cancer Mother   . Colon cancer Neg Hx     Social History:  reports that  he quit smoking about 13 years ago. He has never used smokeless tobacco. He reports that he does not drink alcohol or use drugs.  Allergies:  Allergies  Allergen Reactions  . Codeine Itching and Nausea And Vomiting  . [place] Venom [Bee Venom] Swelling    Medications:  Scheduled: . aspirin EC  81 mg Oral Daily  . atorvastatin  20 mg Oral QHS  . Chlorhexidine Gluconate Cloth  6 each Topical Q0600  . cinacalcet  30 mg Oral QHS  . furosemide  20 mg Oral Daily  . heparin  5,000 Units Subcutaneous Q8H  . insulin aspart  0-5 Units Subcutaneous QHS  . insulin aspart  0-9 Units Subcutaneous TID WC  . isosorbide mononitrate  15 mg Oral BID  . multivitamin  1 tablet Oral QHS     Results for orders placed or performed during the hospital encounter of 01/23/18 (from the past 48 hour(s))  Troponin I - Once     Status: None   Collection Time: 01/23/18 11:34 AM  Result Value Ref Range   Troponin I <0.03 <0.03 ng/mL    Comment: Performed at [facility], [address], [place], [place] [address]  CBC with Differential     Status: Abnormal   Collection Time: 01/23/18 11:34 AM  Result Value Ref Range   WBC 10.7 (H) 4.0 - 10.5 K/uL   RBC 3.46 (L) 4.22 - 5.81 MIL/uL   Hemoglobin 10.8 (L) 13.0 - 17.0 g/dL   HCT 34.3 (L) 39.0 - 52.0 %   MCV 99.1 80.0 - 100.0 fL   MCH 31.2 26.0 - 34.0 pg   MCHC 31.5 30.0 - 36.0 g/dL   RDW 13.3 11.5 - 15.5 %   Platelets 178 150 - 400 K/uL   nRBC 0.0 0.0 - 0.2 %   Neutrophils Relative % 68 %   Neutro Abs 7.3 1.7 - 7.7 K/uL   Lymphocytes Relative 23 %   Lymphs Abs 2.4 0.7 - 4.0 K/uL   Monocytes Relative 7 %   Monocytes Absolute 0.7 0.1 - 1.0 K/uL   Eosinophils Relative 1 %   Eosinophils Absolute 0.1 0.0 - 0.5 K/uL   Basophils Relative 0 %   Basophils Absolute 0.0 0.0 - 0.1 K/uL   Immature Granulocytes 1 %   Abs Immature Granulocytes 0.05 0.00 - 0.07 K/uL    Comment: Performed at [facility], [address], [place], [place] [address]   Comprehensive metabolic panel     Status: Abnormal   Collection Time: 01/23/18 11:34 AM  Result Value Ref Range   Sodium 133 (L) 135 - 145 mmol/L   Potassium 4.7 3.5 - 5.1 mmol/L   Chloride 99 98 - 111 mmol/L   CO2 23 22 - 32 mmol/L   Glucose, Bld 157 (H) 70 - 99 mg/dL   BUN 48 (H) 8 - 23  mg/dL   Creatinine, Ser 6.01 (H) 0.61 - 1.24 mg/dL   Calcium 8.4 (L) 8.9 - 10.3 mg/dL   Total Protein 6.2 (L) 6.5 - 8.1 g/dL   Albumin 2.7 (L) 3.5 - 5.0 g/dL   AST 12 (L) 15 - 41 U/L   ALT 11 0 - 44 U/L   Alkaline Phosphatase 48 38 - 126 U/L   Total Bilirubin 0.4 0.3 - 1.2 mg/dL   GFR calc non Af Amer 8 (L) >60 mL/min   GFR calc Af Amer 9 (L) >60 mL/min   Anion gap 11 5 - 15    Comment: Performed at [facility], [address], [place], [place] [address]  Troponin I - Now Then Q6H     Status: None   Collection Time: 01/23/18  8:44 PM  Result Value Ref Range   Troponin I <0.03 <0.03 ng/mL    Comment: Performed at [facility] [address] [place], [place] [address]  Glucose, capillary     Status: None   Collection Time: 01/23/18  9:25 PM  Result Value Ref Range   Glucose-Capillary 86 70 - 99 mg/dL  Glucose, capillary     Status: None   Collection Time: 01/23/18 11:47 PM  Result Value Ref Range   Glucose-Capillary 95 70 - 99 mg/dL  Renal function panel     Status: Abnormal   Collection Time: 01/24/18  3:05 AM  Result Value Ref Range   Sodium 135 135 - 145 mmol/L   Potassium 4.7 3.5 - 5.1 mmol/L   Chloride 98 98 - 111 mmol/L   CO2 22 22 - 32 mmol/L   Glucose, Bld 105 (H) 70 - 99 mg/dL   BUN 55 (H) 8 - 23 mg/dL   Creatinine, Ser 7.03 (H) 0.61 - 1.24 mg/dL   Calcium 8.5 (L) 8.9 - 10.3 mg/dL   Phosphorus 4.7 (H) 2.5 - 4.6 mg/dL   Albumin 2.4 (L) 3.5 - 5.0 g/dL   GFR calc non Af Amer 7 (L) >60 mL/min   GFR calc Af Amer 8 (L) >60 mL/min   Anion gap 15 5 - 15    Comment: Performed at [facility], [place] [address] [place], [place] [address]  Troponin I - Now Then Q6H      Status: None   Collection Time: 01/24/18  3:05 AM  Result Value Ref Range   Troponin I <0.03 <0.03 ng/mL    Comment: Performed at [facility] [address] [place], [place] [address]  Glucose, capillary     Status: Abnormal   Collection Time: 01/24/18  7:45 AM  Result Value Ref Range   Glucose-Capillary 118 (H) 70 - 99 mg/dL  Glucose, capillary     Status: Abnormal   Collection Time: 01/24/18 11:33 AM  Result Value Ref Range   Glucose-Capillary 146 (H) 70 - 99 mg/dL    Dg Chest Port 1 View  Result Date: 01/23/2018 CLINICAL DATA:  Chest pain for several weeks EXAM: PORTABLE CHEST 1 VIEW COMPARISON:  Chest x-ray dated 03/06/2017. FINDINGS: Heart size and mediastinal contours are stable. Heart size is upper normal, stable. Lungs are clear. Stable chronic elevation of the LEFT hemidiaphragm. No pleural effusion or pneumothorax seen. Advanced degenerative change at the LEFT shoulder. No acute appearing osseous abnormality. IMPRESSION: No active disease. No evidence of pneumonia or pulmonary edema. Electronically Signed   By: Franki Cabot M.D.   On: 01/23/2018 11:35    ROS: all other  systems reviewed and are negative Blood pressure (!) 112/49, pulse (!) 47, temperature 98.6 F (37 C), temperature source Oral, resp. rate 20, weight 61.3 kg, SpO2 (!) 85 %. .  GEN NAD, sleeping and easily arousable HEENT EOMI PERRL NECK no JVD PULM clear bilaterally  CV + bradycardic ABD nontender nondistended NABS EXT s/p bilateral BKAs NEURO AAO X 3 ACCESS LUE AVF + T/B  Assessment/Plan: 1 CP: appears to be stable this AM.  Cardiology signed off, no plans for cardiac cath.  EKG without ischemic changes and trops neg.  2 ESRD: MWF, missed yesterday, on schedule for today 3 Hypertension/ vol: to EDW 4. Anemia of ESRD: stable 5. Metabolic Bone Disease: stable  Alan [name] 01/24/2018, 1:18 PM

## 2018-01-24 NOTE — Progress Notes (Signed)
Progress Note  Patient Name: Alan [name] Date of Encounter: 01/24/2018  Primary Cardiologist: Carlyle Dolly, MD   Subjective   Denies chest pain and shortness of breath.  Resting comfortably.  Inpatient Medications    Scheduled Meds: . aspirin EC  81 mg Oral Daily  . atorvastatin  20 mg Oral QHS  . cinacalcet  30 mg Oral QHS  . furosemide  20 mg Oral Daily  . heparin  5,000 Units Subcutaneous Q8H  . insulin aspart  0-5 Units Subcutaneous QHS  . insulin aspart  0-9 Units Subcutaneous TID WC  . isosorbide mononitrate  15 mg Oral BID  . metoprolol succinate  12.5 mg Oral Daily  . multivitamin  1 tablet Oral QHS   Continuous Infusions:  PRN Meds: acetaminophen **OR** acetaminophen, nitroGLYCERIN, ondansetron **OR** ondansetron (ZOFRAN) IV   Vital Signs    Vitals:   01/23/18 1730 01/23/18 1927 01/23/18 2000 01/24/18 0427  BP: (!) 146/86 (!) 110/58 (!) 140/54 (!) 100/53  Pulse: (!) 50 (!) 55  (!) 51  Resp: 17 17    Temp:  97.7 F (36.5 C)  97.7 F (36.5 C)  TempSrc:  Oral  Oral  SpO2: 100% 100%  98%  Weight:    61.3 kg   No intake or output data in the 24 hours ending 01/24/18 1007 Filed Weights   01/23/18 1106 01/24/18 0427  Weight: 65.2 kg 61.3 kg    Telemetry    Sinus bradycardia- Personally Reviewed  ECG    Sinus bradycardia, 52 bpm.- Personally Reviewed  Physical Exam   GEN: No acute distress.   Neck: No JVD Cardiac: RRR, no murmurs, rubs, or gallops.  Respiratory:  Somewhat coarse bilaterally, no crackles. GI: Soft, nontender, non-distended  MS: No edema; bilateral BKA Neuro:  Nonfocal  Psych: Normal affect   Labs    Chemistry Recent Labs  Lab 01/23/18 1134 01/24/18 0305  NA 133* 135  K 4.7 4.7  CL 99 98  CO2 23 22  GLUCOSE 157* 105*  BUN 48* 55*  CREATININE 6.01* 7.03*  CALCIUM 8.4* 8.5*  PROT 6.2*  --   ALBUMIN 2.7* 2.4*  AST 12*  --   ALT 11  --   ALKPHOS 48  --   BILITOT 0.4  --   GFRNONAA 8* 7*  GFRAA 9* 8*    ANIONGAP 11 15     Hematology Recent Labs  Lab 01/23/18 1134  WBC 10.7*  RBC 3.46*  HGB 10.8*  HCT 34.3*  MCV 99.1  MCH 31.2  MCHC 31.5  RDW 13.3  PLT 178    Cardiac Enzymes Recent Labs  Lab 01/23/18 1134 01/23/18 2044 01/24/18 0305  TROPONINI <0.03 <0.03 <0.03   No results for input(s): TROPIPOC in the last 168 hours.   BNPNo results for input(s): BNP, PROBNP in the last 168 hours.   DDimer No results for input(s): DDIMER in the last 168 hours.   Radiology    Dg Chest Port 1 View  Result Date: 01/23/2018 CLINICAL DATA:  Chest pain for several weeks EXAM: PORTABLE CHEST 1 VIEW COMPARISON:  Chest x-ray dated 03/06/2017. FINDINGS: Heart size and mediastinal contours are stable. Heart size is upper normal, stable. Lungs are clear. Stable chronic elevation of the LEFT hemidiaphragm. No pleural effusion or pneumothorax seen. Advanced degenerative change at the LEFT shoulder. No acute appearing osseous abnormality. IMPRESSION: No active disease. No evidence of pneumonia or pulmonary edema. Electronically Signed   By: Roxy Horseman.D.  On: 01/23/2018 11:35    Cardiac Studies   See admission note yesterday  Patient Profile     77 y.o. male with past medical history of CAD (s/p CABG in 1996 with LIMA-LAD and SVG-OM1 with cath in 01/2017 showing occluded SVG-OM1 and atretic LIMA with medical therapy recommended), HTN, HLD, Type 2 DM, PVD (s/p bilateral amputations), and ESRD who is being seen today for the evaluation of chest pain at the request of Dr. Thurnell Garbe.   Assessment & Plan    1. Atypical Chest Pain in the Setting of Known CAD - He currently denies chest pain. - he is s/p CABG in 1996 with LIMA-LAD and SVG-OM1. Most recent catheterization in 01/2017 showed occluded SVG-OM1 and atretic LIMA with medical therapy recommended as outlined above. - He has experienced recurrent admissions for atypical chest pain and his pain does seem atypical by history as he reports it  has been constant for over 3 years and is worse with coughing or turning from side-to-side. His pain is also reproducible upon examination. -Troponins have been negative. EKG shows sinus bradycardia, HR 52, with initial ECG showing nonspecific IVCD and peaked T-waves along V2-V3.  - continue ASA, statin, and Imdur.  I Zakari stop Toprol-XL given ongoing bradycardia.   2. Bradycardia - telemetry shows HR currently in the 50's. No episodes of CHB or significant pauses thus far. This is a chronic issue for the patient but his wife notes he has been more fatigued and is concerned this is a contributing factor.  I Green stop Toprol-XL.  3. HTN - Initial reports of SBP being in the 60s to 80s while at hemodialysis but this was checked through his coat, therefore doubt the accuracy of these readings. BP has been stable at  100/53 currently- 131/69 while in the ED. He had been on Amlodipine 5mg  daily, Imdur 15mg  BID, Lasix 20mg  daily, Lisinopril 5mg  daily, and Toprol-XL 12.5mg  daily.  Amlodipine and lisinopril have been held.  I Mohamedamin stop Toprol-XL.  4. HLD - Followed by PCP. Goal LDL is less than 70 in the setting of known CAD. He remains on Atorvastatin 20mg  daily.   5. Type 2 DM - no recent Hgb A1c on file. By review of Epic, this was at 6.7 in 01/2017.  - per admitting team.   6. PVD - s/p bilateral BKA's. Uses a motorized wheelchair.   7. ESRD - on HD - MWF schedule. [place] need Nephrology consult to arrange for HD during admission.   [facility] sign off.   Medication Recommendations: As above Other recommendations (labs, testing, etc): No further testing. Follow up as an outpatient: With Dr. Harl Bowie or APP  For questions or updates, please contact [facility] Please consult [email or web address] for contact info under Cardiology/STEMI.      Signed, Kate Sable, MD  01/24/2018, 10:07 AM

## 2018-01-25 DIAGNOSIS — N186 End stage renal disease: Secondary | ICD-10-CM | POA: Diagnosis not present

## 2018-01-25 DIAGNOSIS — E782 Mixed hyperlipidemia: Secondary | ICD-10-CM | POA: Diagnosis not present

## 2018-01-25 DIAGNOSIS — I208 Other forms of angina pectoris: Secondary | ICD-10-CM | POA: Diagnosis not present

## 2018-01-25 DIAGNOSIS — I953 Hypotension of hemodialysis: Secondary | ICD-10-CM | POA: Diagnosis not present

## 2018-01-25 LAB — GLUCOSE, CAPILLARY: Glucose-Capillary: 249 mg/dL — ABNORMAL HIGH (ref 70–99)

## 2018-01-25 LAB — HEPATITIS B SURFACE ANTIGEN: HEP B S AG: NEGATIVE

## 2018-01-25 MED ORDER — MIDODRINE HCL 5 MG PO TABS
ORAL_TABLET | ORAL | Status: AC
  Administered 2018-01-25: 5 mg via ORAL
  Filled 2018-01-25: qty 1

## 2018-01-25 NOTE — Discharge Summary (Signed)
Physician Discharge Summary [facility]    Patient: Alan [name]                   Admit date: 01/23/2018   DOB: February 04, 1941             Discharge date:01/25/2018/11:24 AM [id number]                           PCP: [facility]  Recommendations for Outpatient Follow-up:   . Follow up: Follow-up with PCP and cardiology as an outpatient.  Continue scheduled hemodialysis Monday Wednesday and Fridays.  Discharge Condition: Stable   Code Status:   Code Status: Full Code  Diet recommendation: Cardiac diet   Discharge Diagnoses:    Active Problems:   ESRD (end stage renal disease) ([place])   HLD (hyperlipidemia)   Subclinical hyperthyroidism   Angina at rest [facility])   Hemodialysis-associated hypotension   History of Present Illness/ Hospital Course Kathleen Argue Summary:  Alan Motleyis a 77 y.o.malewith medical history ofESRD, coronary disease, diabetes mellitus, stroke presenting with chest pain that began the morning of 01/23/2018 as he was preparing to go to dialysis. Patient was sitting in chair when he developed left-sided chest pressure with some associated shortness of breath. The patient was recently admitted to [facility] 01/08/2018 for similar presentation. He was seen by cardiology at that time. Isosorbide was increased to twice daily, and the patient was discharged in stable condition with instructions to follow-up with his primary cardiologist. Prior to that admission, the patient was also admitted to [facility] after transferred from [facility] to chest pain. It was thought to be demand ischemia from his HD, and the patient was managed medically. Echocardiogram at that time showed LVEF greater than 55%. The patient himself denies any fevers, chills, headache, chest pain, dizziness, syncope. He had one episode of nausea and vomiting yesterday which has resolved. In the emergency department, the patient was afebrile hemodynamically  stable, saturating 100% on room air. Hepatic panel was unremarkable. CBC essentially unremarkable with hemoglobin at baseline and WBC 10.7. EKG shows sinus bradycardia without ST ST wave changes. Initial troponin was negative. Chest x-ray was negative for acute findings.. The patient was given nitroglycerin sublingual with relief of his chest discomfort. The patient was discussed with cardiology, Dr. Margaretann Loveless. Because there was no cardiology at [facility], and because of concerns of angina and recurrent chest pain, the patient is being transferred for further cardiac work-up. At the time of my interview, the patient is chest pain-free. BMP showed a potassium 4.7.  Patient was subsequently admitted, closely monitored on a monitored bed, cardiology and nephrology was consulted.  After further evaluation by cardiology no further intervention was recommended, medication management was optimized by DC Norvasc, deseeding metoprolol due to bradycardia, hypotension.  Patient was stable.  Status post hemodialysis.  Patient has been cleared to be discharged home and follow-up as an outpatient.  For detailed discharge summary please see below :  chest pain in the setting of coronary artery disease -Long history of angina especially during dialysis -Cardiac enzymes were negative, nonspecific changes on EKG, possibly chronic, chest x-ray within normal limits -Chronic bradycardia >>> cardiology status is stopped his Toprol-XL  -01/2017-Cath showed Mid RCA 50%, RPLB second branch 90%, OM1 70%, LCX 100%. Occluded LIMA-LAD, occluded SV-OM1. Recs for medical therapy of his OM disease  -Cardiology was consulted; recommended continue aspirin, statin, Imdur, stop Toprol-XL  -The patient's  bradycardia and soft blood pressures have limited: Subsequently his home medication of Toprol-XL and Norvasc was DC'd. -Continue Imdur -At [facility] determined that the risk of undergoing LHC outweighed the benefits and  that procedure would most likely not improve QOL  -Cardiology subsequently signed off, NO plan for further evaluation including cardiac catheterization.  ESRD -Patient dialyzes Monday, Wednesday, Fridays >> he is to return to his scheduled hemodialysis days. -Potassium is 4.7 without any signs of fluid overload -Nephrology(Dr. Upton)contacted and notified --> scheduled for hemodialysis 01/24/2018  Essential hypertension -Patient blood pressure was running low, Norvasc and Toprol-XL was DC'd he is to continue lisinopril, Lasix With the current home dose.  Diabetes mellitus type 2 -Patient has not been taking Lantus for nearly 2 months -NovoLog sliding scale -Check hemoglobin A1c  History of stroke -Continue aspirin  Hyperlipidemia -Continue statin  Code Status:   Code Status: Full Code  Family Communication:  The above findings and plan of care has been discussed with patient and family in detail, they expressed understanding and agreement of above.  Disposition Plan:  Discharging home  Consultants: Cardiology/nephrology   Discharge Instructions:   Discharge Instructions    Activity as tolerated - No restrictions   Complete by:  As directed    Diet - low sodium heart healthy   Complete by:  As directed    Discharge instructions   Complete by:  As directed    Continue with your scheduled hemodialysis.   Follow-up with PCP and cardiologist as an outpatient.   Increase activity slowly   Complete by:  As directed        Medication List    STOP taking these medications   amLODipine 5 MG tablet Commonly known as:  NORVASC   metoprolol succinate 25 MG 24 hr tablet Commonly known as:  TOPROL-XL     TAKE these medications   acetaminophen 500 MG tablet Commonly known as:  TYLENOL Take 1,000 mg by mouth at bedtime. *May take daily if needed but takes mostly at bedtime   aspirin EC 81 MG tablet Take 81 mg by mouth daily.   atorvastatin 20 MG  tablet Commonly known as:  LIPITOR Take 20 mg by mouth at bedtime.   budesonide-formoterol 80-4.5 MCG/ACT inhaler Commonly known as:  SYMBICORT Inhale 2 puffs into the lungs 2 (two) times daily as needed (for wheezing/shortness of breath.).   cinacalcet 30 MG tablet Commonly known as:  SENSIPAR Take 30 mg by mouth at bedtime.   CoQ10 100 MG Caps Take 100 mg by mouth at bedtime.   diphenhydrAMINE 25 mg capsule Commonly known as:  BENADRYL Take 25 mg by mouth at bedtime.   furosemide 20 MG tablet Commonly known as:  LASIX Take 20 mg by mouth daily.   isosorbide mononitrate 30 MG 24 hr tablet Commonly known as:  IMDUR Take 0.5 tablets (15 mg total) by mouth 2 (two) times daily.   LANTUS SOLOSTAR 100 UNIT/ML Solostar Pen Generic drug:  Insulin Glargine Inject 9 Units into the skin at bedtime.   lisinopril 5 MG tablet Commonly known as:  PRINIVIL,ZESTRIL Take 5 mg by mouth daily.   NEPHRO-VITE PO Take 1 tablet by mouth at bedtime.   nitroGLYCERIN 0.4 MG SL tablet Commonly known as:  NITROSTAT Place 1 tablet (0.4 mg total) under the tongue every 5 (five) minutes as needed for chest pain.   omega-3 acid ethyl esters 1 g capsule Commonly known as:  LOVAZA Take 1 g by mouth daily.  omeprazole 20 MG capsule Commonly known as:  PRILOSEC Take 1 capsule by mouth 2 (two) times daily.   PROSOL 20 % Soln Inject 1 vial into the vein once a week.   SALONPAS PAIN RELIEF PATCH EX Place 1 patch onto the skin daily as needed (for pain relief.).   sevelamer carbonate 800 MG tablet Commonly known as:  RENVELA Take 800 mg by mouth 3 (three) times daily with meals.       Allergies  Allergen Reactions  . Codeine Itching and Nausea And Vomiting  . [place] Venom [Bee Venom] Swelling     Procedures /Studies:   Dg Chest 2 View  Result Date: 01/09/2018 CLINICAL DATA:  Chest pain. EXAM: CHEST - 2 VIEW COMPARISON:  01/07/2018. FINDINGS: Prior CABG. Cardiomegaly with  normal pulmonary vascularity. Postsurgical changes left lung. Mild bibasilar subsegmental atelectasis and or scarring. No pleural effusion or pneumothorax. No acute bony abnormality. Degenerative changes scoliosis thoracic spine. Degenerative changes both shoulders. IMPRESSION: 1.  Prior CABG.  Heart size normal. 2. Postsurgical changes left lung. Bibasilar subsegmental atelectasis and/or scarring again noted. No acute abnormality Electronically Signed   By: Marcello Moores  Register   On: 01/09/2018 12:26   Dg Chest 2 View  Result Date: 01/07/2018 CLINICAL DATA:  Dialysis patient. Non radiating left chest pain for 5 days. EXAM: CHEST - 2 VIEW COMPARISON:  08/29/2017 and 05/12/2017 radiographs. FINDINGS: Lordotic positioning and suboptimal inspiration on the frontal examination. The heart size and mediastinal contours are stable status post cardiac surgery. There is aortic tortuosity. There is increased elevation of the left hemidiaphragm with mildly increased left basilar atelectasis. No confluent airspace opacity, pleural effusion or pneumothorax identified. Glenohumeral degenerative changes and evidence of chronic rotator cuff tears are again noted bilaterally. IMPRESSION: Lower lung volumes with mildly increased left basilar atelectasis. No edema or confluent airspace opacity. Electronically Signed   By: Richardean Sale M.D.   On: 01/07/2018 13:49   Ct Chest W Contrast  Result Date: 01/09/2018 CLINICAL DATA:  Chest pain while at dialysis. EXAM: CT CHEST WITH CONTRAST TECHNIQUE: Multidetector CT imaging of the chest was performed during intravenous contrast administration. CONTRAST:  36mL OMNIPAQUE IOHEXOL 300 MG/ML  SOLN COMPARISON:  07/20/2010 FINDINGS: Cardiovascular: The heart size is normal. No pericardial effusion identified. Previous 3 vessel coronary artery bypass grafting. Aortic atherosclerosis noted. Mediastinum/Nodes: Small low-density nodule in left lobe of thyroid gland noted. The trachea appears  patent and is midline. Normal appearance of the esophagus. No axillary, supraclavicular, mediastinal or hilar adenopathy. Lungs/Pleura: No pleural effusion identified. Scarring noted within the right upper lobe, right middle lobe, lingula and left lower lobe. Pulmonary nodule within the posterolateral right upper lobe measures 9 mm, image 64/4. Unchanged from previous exam. No airspace consolidation identified. Small pulmonary nodule within the posterior right lower lobe measures 3 mm, image 86/4. New from previous exam. Upper Abdomen: Calcified granulomas noted within the liver. Previous cholecystectomy. 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 There is no fluid collections identified at the sternotomy site. Fat containing ventral abdominal wall hernia is again noted below the xiphoid process. Unchanged from previous exam. Scoliosis deformity involves the thoracic spine. IMPRESSION: 1. 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 No new chest wall abnormality identified. 2. Stable 9 mm right upper lobe pulmonary nodule compatible with a benign process. 3. New 3 mm right lower lobe lung nodule noted. No follow-up needed if patient is  low-risk. Non-contrast chest CT can be considered in 12 months if patient is high-risk. This recommendation follows the consensus statement: Guidelines for Management of Incidental Pulmonary Nodules Detected on CT Images: From the Fleischner Society 2017; Radiology 2017; 284:228-243. 4. Chronic granulomatous disease. Electronically Signed   By: Kerby Moors M.D.   On: 01/09/2018 13:32   Dg Chest Port 1 View  Result Date: 01/23/2018 CLINICAL DATA:  Chest pain for several weeks EXAM: PORTABLE CHEST 1 VIEW COMPARISON:  Chest x-ray dated 03/06/2017. FINDINGS: Heart size and mediastinal contours are stable. Heart size is upper normal, stable. Lungs are clear.  Stable chronic elevation of the LEFT hemidiaphragm. No pleural effusion or pneumothorax seen. Advanced degenerative change at the LEFT shoulder. No acute appearing osseous abnormality. IMPRESSION: No active disease. No evidence of pneumonia or pulmonary edema. Electronically Signed   By: Franki Cabot M.D.   On: 01/23/2018 11:35     Subjective:   Patient was seen and examined 01/25/2018, 11:24 AM Patient stable today. No acute distress.  No issues overnight Stable for discharge.  Discharge Exam:    Vitals:   01/25/18 0230 01/25/18 0400 01/25/18 0409 01/25/18 0419  BP: (!) 91/45 (!) 120/56 (!) 114/57   Pulse: (!) 53 (!) 54 (!) 57   Resp: 16 16    Temp:  97.8 F (36.6 C) 98 F (36.7 C) (!) 97.5 F (36.4 C)  TempSrc:  Oral Oral Oral  SpO2: 100% 100% 99%   Weight:  60.1 kg      General: Pt lying comfortably in bed & appears in no obvious distress. Cardiovascular: S1 & S2 heard, RRR, S1/S2 +. No murmurs, rubs, gallops or clicks. No JVD or pedal edema. Respiratory: Clear to auscultation without wheezing, rhonchi or crackles. No increased work of breathing. Abdominal:  Non-distended, non-tender & soft. No organomegaly or masses appreciated. Normal bowel sounds heard. CNS: Alert and oriented. No focal deficits. Extremities: no edema, no cyanosis, bilateral BKA  The results of significant diagnostics from this hospitalization (including imaging, microbiology, ancillary and laboratory) are listed below for reference.      Microbiology:   No results found for this or any previous visit (from the past 240 hour(s)).   Labs:   CBC: Recent Labs  Lab 01/23/18 1134 01/24/18 2326  WBC 10.7* 8.1  NEUTROABS 7.3  --   HGB 10.8* 9.5*  HCT 34.3* 30.9*  MCV 99.1 98.4  PLT 178 308   Basic Metabolic Panel: Recent Labs  Lab 01/23/18 1134 01/24/18 0305  NA 133* 135  K 4.7 4.7  CL 99 98  CO2 23 22  GLUCOSE 157* 105*  BUN 48* 55*  CREATININE 6.01* 7.03*  CALCIUM 8.4* 8.5*  PHOS   --  4.7*   Liver Function Tests: Recent Labs  Lab 01/23/18 1134 01/24/18 0305  AST 12*  --   ALT 11  --   ALKPHOS 48  --   BILITOT 0.4  --   PROT 6.2*  --   ALBUMIN 2.7* 2.4*   BNP (last 3 results) No results for input(s): BNP in the last 8760 hours. Cardiac Enzymes: Recent Labs  Lab 01/23/18 1134 01/23/18 2044 01/24/18 0305  TROPONINI <0.03 <0.03 <0.03   CBG: Recent Labs  Lab 01/23/18 2347 01/24/18 0745 01/24/18 1133 01/24/18 1616 01/24/18 2128  GLUCAP 95 118* 146* 178* 84  Urinalysis    Component Value Date/Time   COLORURINE YELLOW 06/14/2016 2314   APPEARANCEUR CLOUDY (A) 06/14/2016 2314   LABSPEC 1.009 06/14/2016  [place] 7.0 06/14/2016 2314   GLUCOSEU NEGATIVE 06/14/2016 2314   HGBUR SMALL (A) 06/14/2016 2314   BILIRUBINUR NEGATIVE 06/14/2016 [place] 06/14/2016 2314   PROTEINUR 100 (A) 06/14/2016 2314   UROBILINOGEN 4.0 (H) 11/05/2014 1930   NITRITE NEGATIVE 06/14/2016 2314   LEUKOCYTESUR LARGE (A) 06/14/2016 2314    Time coordinating discharge: Over 30 minutes  SIGNED: Deatra James, MD, FACP, [facility]. [facility],  Pager [phone]  If 7PM-7AM, please contact night-coverage [email or web address], Password [facility] 01/25/2018, 11:24 AM

## 2018-01-25 NOTE — Progress Notes (Signed)
HD tx completed @ 0230 w/bp below ordered parameters for first 1/2 of tx. Pt stayed in 25'G systolic for first 1/2 of tx and was completely asymptomatic but UF was turned off d/t his bp OOL. Once bp got above 100 sys then UF was turned on and UF time was slowly decreased over the course of the next 2 hours of tx to try and reach goal UF goal met Blood rinsed back VSS Report called to Daylene Posey, RN

## 2018-01-25 NOTE — Progress Notes (Signed)
HD tx initiated via 15Gx2 w/o problem Pull/push/flush well w/o problem VSS Kyrin continue to monitor while on HD tx 

## 2018-01-25 NOTE — Progress Notes (Signed)
  [facility] Progress Note   Assessment/ Plan:   1. 1 CP: appears to be stable this AM.  Cardiology signed off, no plans for cardiac cath.  EKG without ischemic changes and trops neg. Did OK with dialysis 2 ESRD: MWF, missed Friday, dialyzed Saturday.  Should go to regularly scheduled rx Monday. 3 Hypertension/ vol: to EDW 4. Anemia of ESRD: stable 5. Metabolic Bone Disease: stable 6.  Dispo: per pt, for d/c today  Subjective:    For d/c today per pt.  Tolerated HD yesterday without any CP   Objective:   BP (!) 114/57 (BP Location: Right Arm)   Pulse (!) 57   Temp (!) 97.5 F (36.4 C) (Oral)   Resp 16   Wt 60.1 kg   SpO2 99%   BMI 25.88 kg/m   Physical Exam: GEN NAD, sleeping and easily arousable HEENT EOMI PERRL NECK no JVD PULM clear bilaterally  CV + bradycardic ABD nontender nondistended NABS EXT s/p bilateral BKAs NEURO AAO X 3 ACCESS LUE AVF + T/B Labs: BMET Recent Labs  Lab 01/23/18 1134 01/24/18 0305  NA 133* 135  K 4.7 4.7  CL 99 98  CO2 23 22  GLUCOSE 157* 105*  BUN 48* 55*  CREATININE 6.01* 7.03*  CALCIUM 8.4* 8.5*  PHOS  --  4.7*   CBC Recent Labs  Lab 01/23/18 1134 01/24/18 2326  WBC 10.7* 8.1  NEUTROABS 7.3  --   HGB 10.8* 9.5*  HCT 34.3* 30.9*  MCV 99.1 98.4  PLT 178 202    @IMGRELPRIORS @ Medications:    . aspirin EC  81 mg Oral Daily  . atorvastatin  20 mg Oral QHS  . Chlorhexidine Gluconate Cloth  6 each Topical Q0600  . cinacalcet  30 mg Oral QHS  . furosemide  20 mg Oral Daily  . heparin  5,000 Units Subcutaneous Q8H  . insulin aspart  0-5 Units Subcutaneous QHS  . insulin aspart  0-9 Units Subcutaneous TID WC  . isosorbide mononitrate  15 mg Oral BID  . multivitamin  1 tablet Oral QHS     Madelon Lips, MD 01/25/2018, 11:10 AM

## 2018-01-26 NOTE — Progress Notes (Deleted)
Cardiology Office Note   Date:  01/26/2018   ID:  Alan, [name] December 25, 1940, MRN [id number]  PCP:  [facility]  Cardiologist: Dr. Harl Bowie  No chief complaint on file.    History of Present Illness: Alan [name] is a 77 y.o. male who presents for post hospital follow up with known history of CAD (s/p CABG in 1996, LIMA to LAD, SVG to OM1), most recent cardiac cath in 01/2017 demonstrating occluded SVG to OM1 and atretic LIMA with medical therapy recommended. Other history includes HTN, HLD, and Type II Diabetes, with PVD (s/p bilateral LE amputations).   He was seen on consultation by Dr. Debara Pickett due to elevated troponin in the setting of chest pain. It was felt the troponin elevation was due to ESRD and a result of demand ischemia. Imdur was increased to 15 mg daily to BID. He was diagnosed with atypical chest pain. He has had several admissions for same. Due to bradycardia, he was taken off of Toprol.    Past Medical History:  Diagnosis Date  . Arthritis   . Asthma   . Clotted renal dialysis arteriovenous graft ([place])   . Coronary artery disease    a. s/p CABG in 1996 with LIMA-LAD and SVG-OM1 b. 01/2017: cath showing occluded SVG-OM1 and atretic LIMA-LAD. Diffuse disease along 1st Mrg and PDA with medical therapy recommended.   . CVA (cerebral infarction)   . DDD (degenerative disc disease), cervical   . DDD (degenerative disc disease), lumbar   . Depression   . Diabetes mellitus   . ESRD (end stage renal disease) on dialysis ([place])   . Gangrene of toe ([place]) Feb. 2015   Right  great    . Gastric ulcer    EGD 2014  . Gout   . Hypercholesteremia   . Hypertension   . Sternal wound dehiscence    chronic  . Stroke ([place])   . 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 Thrombosis of renal dialysis arteriovenous graft [facility])     Past Surgical History:  Procedure Laterality Date  . A/V FISTULAGRAM Left 05/28/2016   Procedure: A/V Fistulagram;  Surgeon:  Katha Cabal, MD;  Location: [place] [facility];  Service: Cardiovascular;  Laterality: Left;  . A/V FISTULAGRAM Left 10/28/2016   Procedure: A/V Fistulagram;  Surgeon: Algernon Huxley, MD;  Location: [place] [facility];  Service: Cardiovascular;  Laterality: Left;  . A/V FISTULAGRAM Left 03/03/2017   Procedure: A/V FISTULAGRAM;  Surgeon: Algernon Huxley, MD;  Location: [place] [facility];  Service: Cardiovascular;  Laterality: Left;  . A/V SHUNT INTERVENTION N/A 05/28/2016   Procedure: A/V Shunt Intervention;  Surgeon: Katha Cabal, MD;  Location: [place] [facility];  Service: Cardiovascular;  Laterality: N/A;  . A/V SHUNT INTERVENTION N/A 03/03/2017   Procedure: A/V SHUNT INTERVENTION;  Surgeon: Algernon Huxley, MD;  Location: [place] [facility];  Service: Cardiovascular;  Laterality: N/A;  . AMPUTATION Right 04/10/2013   Procedure: Right Below Knee Amputation;  Surgeon: Newt Minion, MD;  Location: [facility];  Service: Orthopedics;  Laterality: Right;  Right Below Knee Amputation  . APPENDECTOMY    . BELOW KNEE LEG AMPUTATION Bilateral   . CHOLECYSTECTOMY    . CORONARY ARTERY BYPASS GRAFT  1996   [place]  . ESOPHAGOGASTRODUODENOSCOPY Left 05/24/2012   [id number] dilated baggy but otherwise a normal/ Small hiatal hernia. Gastric ulcer -S/P biopsy  . LEFT HEART CATH AND CORS/GRAFTS ANGIOGRAPHY N/A 02/12/2017  Procedure: LEFT HEART CATH AND CORS/GRAFTS ANGIOGRAPHY;  Surgeon: Lorretta Harp, MD;  Location: [place] [facility];  Service: Cardiovascular;  Laterality: N/A;  . PERIPHERAL VASCULAR CATHETERIZATION N/A 08/09/2014   Procedure: A/V Shuntogram/Fistulagram;  Surgeon: Katha Cabal, MD;  Location: [place] [facility];  Service: Cardiovascular;  Laterality: N/A;  . PERIPHERAL VASCULAR CATHETERIZATION Left 08/09/2014   Procedure: A/V Shunt Intervention;  Surgeon: Katha Cabal, MD;  Location: [place] [facility];  Service: Cardiovascular;  Laterality: Left;     Current  Outpatient Medications  Medication Sig Dispense Refill  . acetaminophen (TYLENOL) 500 MG tablet Take 1,000 mg by mouth at bedtime. *May take daily if needed but takes mostly at bedtime    . Amino Acid Infusion (PROSOL) 20 % SOLN Inject 1 vial into the vein once a week.    Marland Kitchen aspirin EC 81 MG tablet Take 81 mg by mouth daily.     Marland Kitchen atorvastatin (LIPITOR) 20 MG tablet Take 20 mg by mouth at bedtime.     . B Complex-C-Folic Acid (NEPHRO-VITE PO) Take 1 tablet by mouth at bedtime.     . budesonide-formoterol (SYMBICORT) 80-4.5 MCG/ACT inhaler Inhale 2 puffs into the lungs 2 (two) times daily as needed (for wheezing/shortness of breath.).    Marland Kitchen cinacalcet (SENSIPAR) 30 MG tablet Take 30 mg by mouth at bedtime.     . Coenzyme Q10 (COQ10) 100 MG CAPS Take 100 mg by mouth at bedtime.    . diphenhydrAMINE (BENADRYL) 25 mg capsule Take 25 mg by mouth at bedtime.     . furosemide (LASIX) 20 MG tablet Take 20 mg by mouth daily.    . isosorbide mononitrate (IMDUR) 30 MG 24 hr tablet Take 0.5 tablets (15 mg total) by mouth 2 (two) times daily. 30 tablet 0  . LANTUS SOLOSTAR 100 UNIT/ML Solostar Pen Inject 9 Units into the skin at bedtime.     . Liniments (SALONPAS PAIN RELIEF PATCH EX) Place 1 patch onto the skin daily as needed (for pain relief.).     Marland Kitchen lisinopril (PRINIVIL,ZESTRIL) 5 MG tablet Take 5 mg by mouth daily.    . nitroGLYCERIN (NITROSTAT) 0.4 MG SL tablet Place 1 tablet (0.4 mg total) under the tongue every 5 (five) minutes as needed for chest pain. 30 tablet 0  . omega-3 acid ethyl esters (LOVAZA) 1 g capsule Take 1 g by mouth daily.    Marland Kitchen omeprazole (PRILOSEC) 20 MG capsule Take 1 capsule by mouth 2 (two) times daily.  0  . sevelamer carbonate (RENVELA) 800 MG tablet Take 800 mg by mouth 3 (three) times daily with meals.      No current facility-administered medications for this visit.     Allergies:   Codeine and Yellow jacket venom [bee venom]    Social History:  The patient  reports that  he quit smoking about 13 years ago. He has never used smokeless tobacco. He reports that he does not drink alcohol or use drugs.   Family History:  The patient's family history includes Cancer in his mother.    ROS: All other systems are reviewed and negative. Unless otherwise mentioned in H&P    PHYSICAL EXAM: VS:  There were no vitals taken for this visit. , BMI There is no height or weight on file to calculate BMI. GEN: Well nourished, well developed, in no acute distress HEENT: normal Neck: no JVD, carotid bruits, or masses Cardiac: ***RRR; no murmurs, rubs, or gallops,no edema  Respiratory:  Clear to auscultation bilaterally, normal work of breathing GI: soft, nontender, nondistended, + BS MS: no deformity or atrophy Skin: warm and dry, no rash Neuro:  Strength and sensation are intact Psych: euthymic mood, full affect   EKG:  EKG {ACTION; IS/IS [id number]} ordered today. The ekg ordered today demonstrates ***   Recent Labs: 01/07/2018: Magnesium 1.8 01/23/2018: ALT 11 01/24/2018: BUN 55; Creatinine, Ser 7.03; Hemoglobin 9.5; Platelets 202; Potassium 4.7; Sodium 135    Lipid Panel    Component Value Date/Time   CHOL 224 (H) 02/11/2017 0809   TRIG 86 02/11/2017 0809   HDL 39 (L) 02/11/2017 0809   CHOLHDL 5.7 02/11/2017 0809   VLDL 17 02/11/2017 0809   LDLCALC 168 (H) 02/11/2017 0809      Wt Readings from Last 3 Encounters:  01/25/18 132 lb 7.9 oz (60.1 kg)  01/08/18 143 lb 11.8 oz (65.2 kg)  05/12/17 165 lb (74.8 kg)      Other studies Reviewed: Additional studies/ records that were reviewed today include: ***. Review of the above records demonstrates: ***   ASSESSMENT AND PLAN:  1.  ***   Current medicines are reviewed at length with the patient today.    Labs/ tests ordered today include: *** Phill Myron. [place], ANP, AACC   01/26/2018 12:55 PM    [address] [address] [phone] Fax [phone]

## 2018-01-27 ENCOUNTER — Ambulatory Visit: Payer: Medicare Other | Admitting: Adult Health

## 2018-01-28 LAB — GLUCOSE, CAPILLARY: Glucose-Capillary: 107 mg/dL — ABNORMAL HIGH (ref 70–99)

## 2018-02-04 ENCOUNTER — Telehealth (INDEPENDENT_AMBULATORY_CARE_PROVIDER_SITE_OTHER): Payer: Self-pay

## 2018-02-04 NOTE — Telephone Encounter (Signed)
I received a fax stating the patient was pulling clots and was unable to complete his treatment and needed to be scheduled ASAP. I called [name] at [facility] to offer the patient an appt time for tomorrow and he refused, so I offered to bring him in on Monday and the patient refused again and stated he was going to wait.

## 2018-02-05 ENCOUNTER — Ambulatory Visit: Payer: Medicare Other | Admitting: Cardiology

## 2018-03-12 ENCOUNTER — Encounter (INDEPENDENT_AMBULATORY_CARE_PROVIDER_SITE_OTHER): Payer: Medicare Other

## 2018-03-19 ENCOUNTER — Ambulatory Visit: Payer: Medicare Other | Admitting: Cardiology

## 2018-03-19 DIAGNOSIS — R0989 Other specified symptoms and signs involving the circulatory and respiratory systems: Secondary | ICD-10-CM

## 2018-03-19 NOTE — Progress Notes (Deleted)
Clinical Summary Alan [name] is a 78 y.o.male    1. CAD - long history of chest pains, particularly during HD  - 01/2017 cath OM1 70%, RCA 50%, RPLB 90%, LIMA-LAD occluded, SVG0OM1 occluded. Recs for medical therapy - medical therapy has been limited by low bp's at times, particularly associated with dilaysis.   - admit 01/2018 with atypical chest pain after HD. Negatie workup for ACS.    2. Bradycardia -  Past Medical History:  Diagnosis Date  . Arthritis   . Asthma   . Clotted renal dialysis arteriovenous graft ([place])   . Coronary artery disease    a. s/p CABG in 1996 with LIMA-LAD and SVG-OM1 b. 01/2017: cath showing occluded SVG-OM1 and atretic LIMA-LAD. Diffuse disease along 1st Mrg and PDA with medical therapy recommended.   . CVA (cerebral infarction)   . DDD (degenerative disc disease), cervical   . DDD (degenerative disc disease), lumbar   . Depression   . Diabetes mellitus   . ESRD (end stage renal disease) on dialysis ([place])   . Gangrene of toe ([place]) Feb. 2015   Right  great    . Gastric ulcer    EGD 2014  . Gout   . Hypercholesteremia   . Hypertension   . Sternal wound dehiscence    chronic  . Stroke ([place])   . 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 Thrombosis of renal dialysis arteriovenous graft ([facility])      Allergies  Allergen Reactions  . Codeine Itching and Nausea And Vomiting  . Yellow Jacket Venom [Bee Venom] Swelling     Current Outpatient Medications  Medication Sig Dispense Refill  . acetaminophen (TYLENOL) 500 MG tablet Take 1,000 mg by mouth at bedtime. *May take daily if needed but takes mostly at bedtime    . Amino Acid Infusion (PROSOL) 20 % SOLN Inject 1 vial into the vein once a week.    Marland Kitchen aspirin EC 81 MG tablet Take 81 mg by mouth daily.     Marland Kitchen atorvastatin (LIPITOR) 20 MG tablet Take 20 mg by mouth at bedtime.     . B Complex-C-Folic Acid (NEPHRO-VITE PO) Take 1 tablet by mouth at bedtime.     . budesonide-formoterol  (SYMBICORT) 80-4.5 MCG/ACT inhaler Inhale 2 puffs into the lungs 2 (two) times daily as needed (for wheezing/shortness of breath.).    Marland Kitchen cinacalcet (SENSIPAR) 30 MG tablet Take 30 mg by mouth at bedtime.     . Coenzyme Q10 (COQ10) 100 MG CAPS Take 100 mg by mouth at bedtime.    . diphenhydrAMINE (BENADRYL) 25 mg capsule Take 25 mg by mouth at bedtime.     . furosemide (LASIX) 20 MG tablet Take 20 mg by mouth daily.    . isosorbide mononitrate (IMDUR) 30 MG 24 hr tablet Take 0.5 tablets (15 mg total) by mouth 2 (two) times daily. 30 tablet 0  . LANTUS SOLOSTAR 100 UNIT/ML Solostar Pen Inject 9 Units into the skin at bedtime.     . Liniments (SALONPAS PAIN RELIEF PATCH EX) Place 1 patch onto the skin daily as needed (for pain relief.).     Marland Kitchen lisinopril (PRINIVIL,ZESTRIL) 5 MG tablet Take 5 mg by mouth daily.    . nitroGLYCERIN (NITROSTAT) 0.4 MG SL tablet Place 1 tablet (0.4 mg total) under the tongue every 5 (five) minutes as needed for chest pain. 30 tablet 0  . omega-3 acid ethyl esters (LOVAZA) 1 g capsule Take 1 g by mouth daily.    Marland Kitchen  omeprazole (PRILOSEC) 20 MG capsule Take 1 capsule by mouth 2 (two) times daily.  0  . sevelamer carbonate (RENVELA) 800 MG tablet Take 800 mg by mouth 3 (three) times daily with meals.      No current facility-administered medications for this visit.      Past Surgical History:  Procedure Laterality Date  . A/V FISTULAGRAM Left 05/28/2016   Procedure: A/V Fistulagram;  Surgeon: Katha Cabal, MD;  Location: [place] [facility];  Service: Cardiovascular;  Laterality: Left;  . A/V FISTULAGRAM Left 10/28/2016   Procedure: A/V Fistulagram;  Surgeon: Algernon Huxley, MD;  Location: [place] [facility];  Service: Cardiovascular;  Laterality: Left;  . A/V FISTULAGRAM Left 03/03/2017   Procedure: A/V FISTULAGRAM;  Surgeon: Algernon Huxley, MD;  Location: [place] [facility];  Service: Cardiovascular;  Laterality: Left;  . A/V SHUNT INTERVENTION N/A 05/28/2016    Procedure: A/V Shunt Intervention;  Surgeon: Katha Cabal, MD;  Location: [facility] [facility];  Service: Cardiovascular;  Laterality: N/A;  . A/V SHUNT INTERVENTION N/A 03/03/2017   Procedure: A/V SHUNT INTERVENTION;  Surgeon: Algernon Huxley, MD;  Location: [facility] [facility];  Service: Cardiovascular;  Laterality: N/A;  . AMPUTATION Right 04/10/2013   Procedure: Right Below Knee Amputation;  Surgeon: Newt Minion, MD;  Location: [facility];  Service: Orthopedics;  Laterality: Right;  Right Below Knee Amputation  . APPENDECTOMY    . BELOW KNEE LEG AMPUTATION Bilateral   . CHOLECYSTECTOMY    . CORONARY ARTERY BYPASS GRAFT  1996   [place]  . ESOPHAGOGASTRODUODENOSCOPY Left 05/24/2012   TKZ:[id number] dilated baggy but otherwise a normal/ Small hiatal hernia. Gastric ulcer -S/P biopsy  . LEFT HEART CATH AND CORS/GRAFTS ANGIOGRAPHY N/A 02/12/2017   Procedure: LEFT HEART CATH AND CORS/GRAFTS ANGIOGRAPHY;  Surgeon: Lorretta Harp, MD;  Location: [place] [facility];  Service: Cardiovascular;  Laterality: N/A;  . PERIPHERAL VASCULAR CATHETERIZATION N/A 08/09/2014   Procedure: A/V Shuntogram/Fistulagram;  Surgeon: Katha Cabal, MD;  Location: [place] [facility];  Service: Cardiovascular;  Laterality: N/A;  . PERIPHERAL VASCULAR CATHETERIZATION Left 08/09/2014   Procedure: A/V Shunt Intervention;  Surgeon: Katha Cabal, MD;  Location: [place] [facility];  Service: Cardiovascular;  Laterality: Left;     Allergies  Allergen Reactions  . Codeine Itching and Nausea And Vomiting  . Yellow Jacket Venom [Bee Venom] Swelling      Family History  Problem Relation Age of Onset  . Cancer Mother   . Colon cancer Neg Hx      Social History Alan [name] reports that he quit smoking about 13 years ago. He has never used smokeless tobacco. Alan [name] reports no history of alcohol use.   Review of Systems CONSTITUTIONAL: No weight loss, fever, chills, weakness or fatigue.  HEENT: Eyes: No  visual loss, blurred vision, double vision or yellow sclerae.No hearing loss, sneezing, congestion, runny nose or sore throat.  SKIN: No rash or itching.  CARDIOVASCULAR:  RESPIRATORY: No shortness of breath, cough or sputum.  GASTROINTESTINAL: No anorexia, nausea, vomiting or diarrhea. No abdominal pain or blood.  GENITOURINARY: No burning on urination, no polyuria NEUROLOGICAL: No headache, dizziness, syncope, paralysis, ataxia, numbness or tingling in the extremities. No change in bowel or bladder control.  MUSCULOSKELETAL: No muscle, back pain, joint pain or stiffness.  LYMPHATICS: No enlarged nodes. No history of splenectomy.  PSYCHIATRIC: No history of depression or anxiety.  ENDOCRINOLOGIC: No reports of sweating, cold or  heat intolerance. No polyuria or polydipsia.  Marland Kitchen   Physical Examination There were no vitals filed for this visit. There were no vitals filed for this visit.  Gen: resting comfortably, no acute distress HEENT: no scleral icterus, pupils equal round and reactive, no palptable cervical adenopathy,  CV Resp: Clear to auscultation bilaterally GI: abdomen is soft, non-tender, non-distended, normal bowel sounds, no hepatosplenomegaly MSK: extremities are warm, no edema.  Skin: warm, no rash Neuro:  no focal deficits Psych: appropriate affect   Diagnostic Studies     Assessment and Plan        [name], M.D., F.A.C.C.

## 2018-03-20 ENCOUNTER — Encounter: Payer: Self-pay | Admitting: Cardiology

## 2018-03-23 ENCOUNTER — Encounter (INDEPENDENT_AMBULATORY_CARE_PROVIDER_SITE_OTHER): Payer: Medicare Other

## 2018-03-26 ENCOUNTER — Encounter (INDEPENDENT_AMBULATORY_CARE_PROVIDER_SITE_OTHER): Payer: Medicare Other

## 2018-03-27 ENCOUNTER — Encounter (HOSPITAL_COMMUNITY): Payer: Self-pay | Admitting: Emergency Medicine

## 2018-03-27 ENCOUNTER — Emergency Department (HOSPITAL_COMMUNITY): Payer: Medicare Other

## 2018-03-27 ENCOUNTER — Other Ambulatory Visit: Payer: Self-pay

## 2018-03-27 ENCOUNTER — Emergency Department (HOSPITAL_COMMUNITY)
Admission: EM | Admit: 2018-03-27 | Discharge: 2018-03-27 | Disposition: A | Discharge status: Home / Self Care | Payer: Medicare Other | Attending: Emergency Medicine | Admitting: Emergency Medicine

## 2018-03-27 DIAGNOSIS — Z951 Presence of aortocoronary bypass graft: Secondary | ICD-10-CM | POA: Insufficient documentation

## 2018-03-27 DIAGNOSIS — Z794 Long term (current) use of insulin: Secondary | ICD-10-CM | POA: Diagnosis not present

## 2018-03-27 DIAGNOSIS — N186 End stage renal disease: Secondary | ICD-10-CM | POA: Diagnosis not present

## 2018-03-27 DIAGNOSIS — J45909 Unspecified asthma, uncomplicated: Secondary | ICD-10-CM | POA: Diagnosis not present

## 2018-03-27 DIAGNOSIS — I2510 Atherosclerotic heart disease of native coronary artery without angina pectoris: Secondary | ICD-10-CM | POA: Insufficient documentation

## 2018-03-27 DIAGNOSIS — E1122 Type 2 diabetes mellitus with diabetic chronic kidney disease: Secondary | ICD-10-CM | POA: Insufficient documentation

## 2018-03-27 DIAGNOSIS — Z79899 Other long term (current) drug therapy: Secondary | ICD-10-CM | POA: Diagnosis not present

## 2018-03-27 DIAGNOSIS — Z87891 Personal history of nicotine dependence: Secondary | ICD-10-CM | POA: Insufficient documentation

## 2018-03-27 DIAGNOSIS — R079 Chest pain, unspecified: Secondary | ICD-10-CM | POA: Diagnosis present

## 2018-03-27 DIAGNOSIS — R0789 Other chest pain: Secondary | ICD-10-CM | POA: Diagnosis not present

## 2018-03-27 DIAGNOSIS — Z7982 Long term (current) use of aspirin: Secondary | ICD-10-CM | POA: Diagnosis not present

## 2018-03-27 LAB — TROPONIN I
Troponin I: <0.03 ng/mL (ref <0.03)
Troponin I: <0.03 ng/mL (ref <0.03)

## 2018-03-27 LAB — BASIC METABOLIC PANEL
Anion gap: 11 (ref 5–15)
BUN: 30 mg/dL — ABNORMAL HIGH (ref 8–23)
CALCIUM: 8.4 mg/dL — AB (ref 8.9–10.3)
CHLORIDE: 98 mmol/L (ref 98–111)
CO2: 27 mmol/L (ref 22–32)
CREATININE: 3.04 mg/dL — AB (ref 0.61–1.24)
GFR calc Af Amer: 22 mL/min — ABNORMAL LOW (ref >60)
GFR calc non Af Amer: 19 mL/min — ABNORMAL LOW (ref >60)
Glucose, Bld: 161 mg/dL — ABNORMAL HIGH (ref 70–99)
Potassium: 3.7 mmol/L (ref 3.5–5.1)
Sodium: 136 mmol/L (ref 135–145)

## 2018-03-27 LAB — CBC
HEMATOCRIT: 37.3 % — AB (ref 39.0–52.0)
Hemoglobin: 11.5 g/dL — ABNORMAL LOW (ref 13.0–17.0)
MCH: 30.8 pg (ref 26.0–34.0)
MCHC: 30.8 g/dL (ref 30.0–36.0)
MCV: 100 fL (ref 80.0–100.0)
PLATELETS: 183 10*3/uL (ref 150–400)
RBC: 3.73 MIL/uL — ABNORMAL LOW (ref 4.22–5.81)
RDW: 13.7 % (ref 11.5–15.5)
WBC: 7.8 10*3/uL (ref 4.0–10.5)
nRBC: 0 % (ref 0.0–0.2)

## 2018-03-27 MED ORDER — ISOSORBIDE MONONITRATE 15 MG HALF TABLET
15.0000 mg | ORAL_TABLET | Freq: Every day | ORAL | Status: DC
  Administered 2018-03-27: 15 mg via ORAL
  Filled 2018-03-27 (×4): qty 1

## 2018-03-27 MED ORDER — NITROGLYCERIN 0.4 MG SL SUBL
0.4000 mg | SUBLINGUAL_TABLET | SUBLINGUAL | Status: DC | PRN
  Administered 2018-03-27: 0.4 mg via SUBLINGUAL
  Filled 2018-03-27: qty 1

## 2018-03-27 MED ORDER — OXYCODONE-ACETAMINOPHEN 5-325 MG PO TABS
1.0000 | ORAL_TABLET | Freq: Once | ORAL | Status: AC
  Administered 2018-03-27: 1 via ORAL
  Filled 2018-03-27: qty 1

## 2018-03-27 MED ORDER — SODIUM CHLORIDE 0.9% FLUSH
3.0000 mL | Freq: Once | INTRAVENOUS | Status: AC
  Administered 2018-03-27: 3 mL via INTRAVENOUS

## 2018-03-27 MED ORDER — ISOSORBIDE MONONITRATE ER 30 MG PO TB24: ORAL_TABLET | ORAL | 0 refills | Status: DC

## 2018-03-27 NOTE — ED Triage Notes (Addendum)
Pt from [facility] today and stopped it due to being sob and central chest pain. One nitro given by facility and BP dropped to 73/37.

## 2018-03-27 NOTE — ED Notes (Signed)
Tim, [facility] notified of IMDUR tablet.

## 2018-03-27 NOTE — ED Provider Notes (Signed)
[facility] EMERGENCY DEPARTMENT Provider Note   CSN: [id number] Arrival date & time: 03/27/18  1358     History   Chief Complaint Chief Complaint  Patient presents with  . Shortness of Breath    HPI Alan [name] is a 78 y.o. male.  Patient complains of chest pain today while he was having dialysis.  Patient has minimal pain now  The history is provided by the patient. No language interpreter was used.  Chest Pain  Pain location:  Substernal area Pain quality: aching   Pain radiates to:  Does not radiate Pain severity:  Mild Onset quality:  Sudden Duration:  2 hours Timing:  Constant Progression:  Unchanged Chronicity:  Recurrent Context: not breathing   Relieved by:  Nothing Associated symptoms: no abdominal pain, no back pain, no cough, no fatigue and no headache     Past Medical History:  Diagnosis Date  . Arthritis   . Asthma   . Clotted renal dialysis arteriovenous graft ([place])   . Coronary artery disease    a. s/p CABG in 1996 with LIMA-LAD and SVG-OM1 b. 01/2017: cath showing occluded SVG-OM1 and atretic LIMA-LAD. Diffuse disease along 1st Mrg and PDA with medical therapy recommended.   . CVA (cerebral infarction)   . DDD (degenerative disc disease), cervical   . DDD (degenerative disc disease), lumbar   . Depression   . Diabetes mellitus   . ESRD (end stage renal disease) on dialysis ([place])   . Gangrene of toe ([place]) Feb. 2015   Right  great    . Gastric ulcer    EGD 2014  . Gout   . Hypercholesteremia   . Hypertension   . Sternal wound dehiscence    chronic  . Stroke ([place])   . 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 Thrombosis of renal dialysis arteriovenous graft [facility])     Patient Active Problem List   Diagnosis Date Noted  . Angina at rest [facility]) 01/23/2018  . Hemodialysis-associated hypotension   . Left sided numbness 02/10/2017  . Hypertensive urgency 02/10/2017  . Hypokalemia 02/10/2017  . Chest pain 06/15/2016  . Renal dialysis device,  implant, or graft complication 08/67/6195  . Septic shock ([place]) 08/28/2015  . 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 Lower urinary tract infectious disease   . Essential hypertension 08/13/2015  . Nausea and vomiting 08/13/2015  . ESRD (end stage renal disease) ([place]) 04/12/2015  . Gastroenteritis 04/12/2015  . S/P bilateral BKA (below knee amputation) ([place]) 04/12/2015  . HLD (hyperlipidemia) 04/12/2015  . Acute chest pain 11/16/2014  . Atherosclerosis of native arteries of the extremities with gangrene ([place]) 04/20/2013  . Sepsis ([place]) 04/13/2013  . Gram-negative bacteremia 04/12/2013  . ESRD on dialysis ([place]) 04/09/2013  . 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 Protein-calorie malnutrition, severe ([place]) 04/09/2013  . Gangrene of toe ([place]) 04/08/2013  . Toe gangrene ([place]) 04/08/2013  . Nausea with vomiting 05/22/2012  . S/P BKA (below knee amputation) unilateral, left 05/20/2012  . Musculoskeletal chest pain 05/20/2012  . Acute renal failure ([place]) 05/19/2012  . CAD (coronary artery disease) with CABG 05/19/2012  . DM type 2 causing vascular disease ([place]) 05/19/2012  . Hyperkalemia 05/19/2012    Past Surgical History:  Procedure Laterality Date  . A/V FISTULAGRAM Left 05/28/2016   Procedure: A/V Fistulagram;  Surgeon: Katha Cabal, MD;  Location: [place] CV LAB;  Service: Cardiovascular;  Laterality: Left;  . A/V FISTULAGRAM Left 10/28/2016   Procedure: A/V Fistulagram;  Surgeon: Algernon Huxley, MD;  Location: [place] CV LAB;  Service: Cardiovascular;  Laterality: Left;  . A/V FISTULAGRAM Left 03/03/2017   Procedure: A/V FISTULAGRAM;  Surgeon: Algernon Huxley, MD;  Location: [place] CV LAB;  Service: Cardiovascular;  Laterality: Left;  . A/V SHUNT INTERVENTION N/A 05/28/2016   Procedure: A/V Shunt Intervention;  Surgeon: Katha Cabal, MD;  Location: [place] CV LAB;  Service: Cardiovascular;  Laterality: N/A;  . A/V SHUNT INTERVENTION N/A 03/03/2017     Procedure: A/V SHUNT INTERVENTION;  Surgeon: Algernon Huxley, MD;  Location: [place] CV LAB;  Service: Cardiovascular;  Laterality: N/A;  . AMPUTATION Right 04/10/2013   Procedure: Right Below Knee Amputation;  Surgeon: Newt Minion, MD;  Location: [place];  Service: Orthopedics;  Laterality: Right;  Right Below Knee Amputation  . APPENDECTOMY    . BELOW KNEE LEG AMPUTATION Bilateral   . CHOLECYSTECTOMY    . CORONARY ARTERY BYPASS GRAFT  1996   [place]  . ESOPHAGOGASTRODUODENOSCOPY Left 05/24/2012   QMV:HQIONGEX dilated baggy but otherwise a normal/ Small hiatal hernia. Gastric ulcer -S/P biopsy  . LEFT HEART CATH AND CORS/GRAFTS ANGIOGRAPHY N/A 02/12/2017   Procedure: LEFT HEART CATH AND CORS/GRAFTS ANGIOGRAPHY;  Surgeon: Lorretta Harp, MD;  Location: [place] CV LAB;  Service: Cardiovascular;  Laterality: N/A;  . PERIPHERAL VASCULAR CATHETERIZATION N/A 08/09/2014   Procedure: A/V Shuntogram/Fistulagram;  Surgeon: Katha Cabal, MD;  Location: [place] CV LAB;  Service: Cardiovascular;  Laterality: N/A;  . PERIPHERAL VASCULAR CATHETERIZATION Left 08/09/2014   Procedure: A/V Shunt Intervention;  Surgeon: Katha Cabal, MD;  Location: [place] CV LAB;  Service: Cardiovascular;  Laterality: Left;        Home Medications    Prior to Admission medications   Medication Sig Start Date End Date Taking? Authorizing Provider  acetaminophen (TYLENOL) 500 MG tablet Take 1,000 mg by mouth at bedtime. *May take daily if needed but takes mostly at bedtime   Yes [provider]  aspirin EC 81 MG tablet Take 81 mg by mouth daily.    Yes [provider]  Amino Acid Infusion (PROSOL) 20 % SOLN Inject 1 vial into the vein once a week. 02/28/17   [provider]  atorvastatin (LIPITOR) 20 MG tablet Take 20 mg by mouth at bedtime.  01/02/18   [provider]  B Complex-C-Folic Acid (NEPHRO-VITE PO) Take 1 tablet by mouth at bedtime.     [provider]  budesonide-formoterol (SYMBICORT) 80-4.5 MCG/ACT inhaler Inhale 2 puffs into the lungs 2 (two) times daily as needed (for wheezing/shortness of breath.).    [provider]  cinacalcet (SENSIPAR) 30 MG tablet Take 30 mg by mouth at bedtime.     [provider]  Coenzyme Q10 (COQ10) 100 MG CAPS Take 100 mg by mouth at bedtime.    [provider]  diphenhydrAMINE (BENADRYL) 25 mg capsule Take 25 mg by mouth at bedtime.     [provider]  furosemide (LASIX) 20 MG tablet Take 20 mg by mouth daily.    [provider]  isosorbide mononitrate (IMDUR) 30 MG 24 hr tablet Take one half of 1 tablet twice a day 03/27/18   Milton Ferguson, MD  LANTUS SOLOSTAR 100 UNIT/ML Solostar Pen Inject 9 Units into the skin at bedtime.  12/21/13   [provider]  Liniments (SALONPAS PAIN RELIEF PATCH EX) Place 1 patch onto the skin daily as needed (for pain relief.).     [provider]  lisinopril (PRINIVIL,ZESTRIL) 5 MG tablet Take 5 mg by mouth daily.    [provider]  nitroGLYCERIN (NITROSTAT) 0.4 MG SL tablet Place 1 tablet (0.4 mg total) under the tongue every 5 (five) minutes as needed for chest pain. 11/18/14   Samuella Cota, MD  omega-3 acid ethyl esters (LOVAZA) 1 g capsule Take 1 g by mouth daily.    [provider]  omeprazole (PRILOSEC) 20 MG capsule Take 1 capsule by mouth 2 (two) times daily. 03/20/17   [provider]  sevelamer carbonate (RENVELA) 800 MG tablet Take 800 mg by mouth 3 (three) times daily with meals.  01/07/18   [provider]    Family History Family History  Problem Relation Age of Onset  . Cancer Mother   . Colon cancer Neg Hx     Social History Social History   Tobacco Use  . Smoking status: Former Smoker    Last attempt to quit: 08/08/2004    Years since quitting: 13.6  . Smokeless tobacco: Never Used  Substance Use Topics  . Alcohol use: No  . Drug use:  No     Allergies   Codeine and Yellow jacket venom [bee venom]   Review of Systems Review of Systems  Constitutional: Negative for appetite change and fatigue.  HENT: Negative for congestion, ear discharge and sinus pressure.   Eyes: Negative for discharge.  Respiratory: Negative for cough.   Cardiovascular: Positive for chest pain.  Gastrointestinal: Negative for abdominal pain and diarrhea.  Genitourinary: Negative for frequency and hematuria.  Musculoskeletal: Negative for back pain.  Skin: Negative for rash.  Neurological: Negative for seizures and headaches.  Psychiatric/Behavioral: Negative for hallucinations.     Physical Exam Updated Vital Signs BP (!) 166/81   Pulse 74   Temp 97.9 F (36.6 C)   Resp 12   SpO2 100%   Physical Exam Constitutional:      Appearance: He is well-developed.  HENT:     Head: Normocephalic.     Nose: Nose normal.  Eyes:     General: No scleral icterus.    Conjunctiva/sclera: Conjunctivae normal.  Neck:     Musculoskeletal: Neck supple.     Thyroid: No thyromegaly.  Cardiovascular:     Rate and Rhythm: Normal rate and regular rhythm.     Heart sounds: No murmur. No friction rub. No gallop.   Pulmonary:     Breath sounds: No stridor. No wheezing or rales.  Chest:     Chest wall: No tenderness.  Abdominal:     General: There is no distension.     Tenderness: There is no abdominal tenderness. There is no rebound.  Musculoskeletal: Normal range of motion.  Lymphadenopathy:     Cervical: No cervical adenopathy.  Skin:    Findings: No erythema or rash.  Neurological:     Mental Status: He is oriented to person, place, and time.     Motor: No abnormal muscle tone.     Coordination: Coordination normal.  Psychiatric:        Behavior: Behavior normal.      ED Treatments / Results  Labs (all labs ordered are listed, but only abnormal results are displayed) Labs Reviewed  BASIC METABOLIC PANEL - Abnormal; Notable for the  following components:      Result Value   Glucose, Bld 161 (*)    BUN 30 (*)    Creatinine, Ser 3.04 (*)    Calcium 8.4 (*)  GFR calc non Af Amer 19 (*)    GFR calc Af Amer 22 (*)    All other components within normal limits  CBC - Abnormal; Notable for the following components:   RBC 3.73 (*)    Hemoglobin 11.5 (*)    HCT 37.3 (*)    All other components within normal limits  TROPONIN I  TROPONIN I    EKG None  Radiology Dg Chest 2 View  Result Date: 03/27/2018 CLINICAL DATA:  Chest pain. EXAM: CHEST - 2 VIEW COMPARISON:  Chest x-ray 02/27/2018, 01/23/2018. FINDINGS: Prior CABG. Heart size stable. Stable left base pleuroparenchymal thickening noted most consistent with scarring. No pleural effusion or pneumothorax. Stable elevation left hemidiaphragm. IMPRESSION: 1.  Prior CABG.  Heart size stable. 2. Stable left base pleuroparenchymal thickening noted most consistent scarring. Electronically Signed   By: Marcello Moores  Register   On: 03/27/2018 14:50    Procedures Procedures (including critical care time)  Medications Ordered in ED Medications  nitroGLYCERIN (NITROSTAT) SL tablet 0.4 mg (0.4 mg Sublingual Given 03/27/18 1617)  isosorbide mononitrate (IMDUR) 24 hr tablet 15 mg (has no administration in time range)  sodium chloride flush (NS) 0.9 % injection 3 mL (3 mLs Intravenous Given 03/27/18 1523)  oxyCODONE-acetaminophen (PERCOCET/ROXICET) 5-325 MG per tablet 1 tablet (1 tablet Oral Given 03/27/18 1520)     Initial Impression / Assessment and Plan / ED Course  I have reviewed the triage vital signs and the nursing notes.  Pertinent labs & imaging results that were available during my care of the patient were reviewed by me and considered in my medical decision making (see chart for details).    Labs EKG chest x-ray did not show any acute changes.  I discussed the patient with cardiology.  We were going to increase his Imdur to 30 mg twice a day.  Although I found out later from  the patient that he has run out of his Imdur so I have decided just to write him another prescription for 15 mg twice a day and he is to follow-up with his doctor  Final Clinical Impressions(s) / ED Diagnoses   Final diagnoses:  None    ED Discharge Orders         Ordered    isosorbide mononitrate (IMDUR) 30 MG 24 hr tablet     03/27/18 1911           Milton Ferguson, MD 03/27/18 1914

## 2018-03-27 NOTE — Discharge Instructions (Signed)
Follow-up with your doctor next week for recheck 

## 2018-03-27 NOTE — ED Notes (Signed)
Spoke to Alan [name] who stated they would pick pt up shortly.

## 2018-03-27 NOTE — ED Notes (Signed)
Unable to obtain blood. IV has been started.

## 2018-03-27 NOTE — ED Notes (Signed)
Patient transported to x-ray. ?

## 2018-03-27 NOTE — ED Notes (Signed)
Searching for patient's spouse's phone number, it is not listed under 'visit contacts'.

## 2018-04-28 ENCOUNTER — Ambulatory Visit (INDEPENDENT_AMBULATORY_CARE_PROVIDER_SITE_OTHER): Payer: Medicare Other | Admitting: Nurse Practitioner

## 2018-04-28 ENCOUNTER — Encounter (INDEPENDENT_AMBULATORY_CARE_PROVIDER_SITE_OTHER): Payer: Medicare Other

## 2018-05-04 ENCOUNTER — Other Ambulatory Visit: Payer: Self-pay

## 2018-05-04 ENCOUNTER — Emergency Department (HOSPITAL_COMMUNITY): Payer: Medicare Other

## 2018-05-04 ENCOUNTER — Encounter (HOSPITAL_COMMUNITY): Payer: Self-pay | Admitting: Emergency Medicine

## 2018-05-04 ENCOUNTER — Emergency Department (HOSPITAL_COMMUNITY)
Admission: EM | Admit: 2018-05-04 | Discharge: 2018-05-04 | Disposition: A | Discharge status: Home / Self Care | Payer: Medicare Other | Attending: Emergency Medicine | Admitting: Emergency Medicine

## 2018-05-04 DIAGNOSIS — I120 Hypertensive chronic kidney disease with stage 5 chronic kidney disease or end stage renal disease: Secondary | ICD-10-CM | POA: Insufficient documentation

## 2018-05-04 DIAGNOSIS — N186 End stage renal disease: Secondary | ICD-10-CM

## 2018-05-04 DIAGNOSIS — R195 Other fecal abnormalities: Secondary | ICD-10-CM | POA: Diagnosis not present

## 2018-05-04 DIAGNOSIS — Z8673 Personal history of transient ischemic attack (TIA), and cerebral infarction without residual deficits: Secondary | ICD-10-CM | POA: Insufficient documentation

## 2018-05-04 DIAGNOSIS — Z79899 Other long term (current) drug therapy: Secondary | ICD-10-CM | POA: Diagnosis not present

## 2018-05-04 DIAGNOSIS — Z89512 Acquired absence of left leg below knee: Secondary | ICD-10-CM | POA: Diagnosis not present

## 2018-05-04 DIAGNOSIS — Z87891 Personal history of nicotine dependence: Secondary | ICD-10-CM | POA: Diagnosis not present

## 2018-05-04 DIAGNOSIS — Z7982 Long term (current) use of aspirin: Secondary | ICD-10-CM | POA: Insufficient documentation

## 2018-05-04 DIAGNOSIS — Z89511 Acquired absence of right leg below knee: Secondary | ICD-10-CM | POA: Insufficient documentation

## 2018-05-04 DIAGNOSIS — Z992 Dependence on renal dialysis: Secondary | ICD-10-CM | POA: Diagnosis not present

## 2018-05-04 DIAGNOSIS — E1122 Type 2 diabetes mellitus with diabetic chronic kidney disease: Secondary | ICD-10-CM | POA: Insufficient documentation

## 2018-05-04 DIAGNOSIS — Z951 Presence of aortocoronary bypass graft: Secondary | ICD-10-CM | POA: Insufficient documentation

## 2018-05-04 DIAGNOSIS — K625 Hemorrhage of anus and rectum: Secondary | ICD-10-CM | POA: Diagnosis present

## 2018-05-04 LAB — BASIC METABOLIC PANEL
Anion gap: 10 (ref 5–15)
BUN: 19 mg/dL (ref 8–23)
CO2: 29 mmol/L (ref 22–32)
Calcium: 7.9 mg/dL — ABNORMAL LOW (ref 8.9–10.3)
Chloride: 97 mmol/L — ABNORMAL LOW (ref 98–111)
Creatinine, Ser: 1.96 mg/dL — ABNORMAL HIGH (ref 0.61–1.24)
GFR calc Af Amer: 37 mL/min — ABNORMAL LOW (ref >60)
GFR calc non Af Amer: 32 mL/min — ABNORMAL LOW (ref >60)
Glucose, Bld: 134 mg/dL — ABNORMAL HIGH (ref 70–99)
Potassium: 2.8 mmol/L — ABNORMAL LOW (ref 3.5–5.1)
Sodium: 136 mmol/L (ref 135–145)

## 2018-05-04 LAB — CBC WITH DIFFERENTIAL/PLATELET
Abs Immature Granulocytes: 0.05 10*3/uL (ref 0.00–0.07)
BASOS ABS: 0.1 10*3/uL (ref 0.0–0.1)
Basophils Relative: 1 %
Eosinophils Absolute: 0.1 10*3/uL (ref 0.0–0.5)
Eosinophils Relative: 1 %
HCT: 35.1 % — ABNORMAL LOW (ref 39.0–52.0)
Hemoglobin: 11.2 g/dL — ABNORMAL LOW (ref 13.0–17.0)
Immature Granulocytes: 1 %
Lymphocytes Relative: 25 %
Lymphs Abs: 2.5 10*3/uL (ref 0.7–4.0)
MCH: 31 pg (ref 26.0–34.0)
MCHC: 31.9 g/dL (ref 30.0–36.0)
MCV: 97.2 fL (ref 80.0–100.0)
Monocytes Absolute: 0.6 10*3/uL (ref 0.1–1.0)
Monocytes Relative: 6 %
NRBC: 0 % (ref 0.0–0.2)
Neutro Abs: 6.9 10*3/uL (ref 1.7–7.7)
Neutrophils Relative %: 66 %
Platelets: 165 10*3/uL (ref 150–400)
RBC: 3.61 MIL/uL — ABNORMAL LOW (ref 4.22–5.81)
RDW: 12.3 % (ref 11.5–15.5)
WBC: 10.2 10*3/uL (ref 4.0–10.5)

## 2018-05-04 LAB — TYPE AND SCREEN
ABO/RH(D): AB POS
Antibody Screen: NEGATIVE

## 2018-05-04 LAB — POC OCCULT BLOOD, ED: Fecal Occult Bld: POSITIVE — AB

## 2018-05-04 MED ORDER — PANTOPRAZOLE SODIUM 40 MG IV SOLR
20.0000 mg | Freq: Once | INTRAVENOUS | Status: AC
  Administered 2018-05-04: 20 mg via INTRAVENOUS

## 2018-05-04 MED ORDER — SODIUM CHLORIDE 0.9 % IV BOLUS: 500.0000 mL | Freq: Once | INTRAVENOUS | Status: DC

## 2018-05-04 NOTE — Discharge Instructions (Addendum)
You had a very small amount of blood in your stool.  Follow-up with gastroenterologist or intestinal specialist.  Phone number given.

## 2018-05-04 NOTE — ED Notes (Signed)
Called patient's spouse as requested to pick up patient and transfer home. Wife, Dorian Pod, states she Tao be here in approximately 30 minutes.

## 2018-05-04 NOTE — ED Triage Notes (Signed)
Per family he has been having bloody bms and vomiting. He did get herpin at dialysis.

## 2018-05-07 NOTE — ED Provider Notes (Signed)
[facility] EMERGENCY DEPARTMENT Provider Note   CSN: [id number] Arrival date & time: 05/04/18  1501    History   Chief Complaint Chief Complaint  Patient presents with  . GI Bleeding    HPI Alan [name] is a 78 y.o. male.     Level 5 caveat for inability to give history.  Nursing notes report that family says he has been having bloody bowel movements and vomiting.  There is no family here to confirm this.  Patient has absolutely no complaints.  Today was a dialysis today.  Patient is unable to describe any gastrointestinal c/o     Past Medical History:  Diagnosis Date  . Arthritis   . Asthma   . Clotted renal dialysis arteriovenous graft ([place])   . Coronary artery disease    a. s/p CABG in 1996 with LIMA-LAD and SVG-OM1 b. 01/2017: cath showing occluded SVG-OM1 and atretic LIMA-LAD. Diffuse disease along 1st Mrg and PDA with medical therapy recommended.   . CVA (cerebral infarction)   . DDD (degenerative disc disease), cervical   . DDD (degenerative disc disease), lumbar   . Depression   . Diabetes mellitus   . ESRD (end stage renal disease) on dialysis ([place])   . Gangrene of toe ([place]) Feb. 2015   Right  great    . Gastric ulcer    EGD 2014  . Gout   . Hypercholesteremia   . Hypertension   . Sternal wound dehiscence    chronic  . Stroke ([place])   . 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 Thrombosis of renal dialysis arteriovenous graft [facility])     Patient Active Problem List   Diagnosis Date Noted  . Angina at rest [facility]) 01/23/2018  . Hemodialysis-associated hypotension   . Left sided numbness 02/10/2017  . Hypertensive urgency 02/10/2017  . Hypokalemia 02/10/2017  . Chest pain 06/15/2016  . Renal dialysis device, implant, or graft complication 48/25/0037  . Septic shock ([place]) 08/28/2015  . 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 Lower urinary tract infectious disease   . Essential hypertension 08/13/2015  . Nausea and vomiting 08/13/2015  . ESRD (end  stage renal disease) ([place]) 04/12/2015  . Gastroenteritis 04/12/2015  . S/P bilateral BKA (below knee amputation) ([place]) 04/12/2015  . HLD (hyperlipidemia) 04/12/2015  . Acute chest pain 11/16/2014  . Atherosclerosis of native arteries of the extremities with gangrene ([place]) 04/20/2013  . Sepsis ([place]) 04/13/2013  . Gram-negative bacteremia 04/12/2013  . ESRD on dialysis ([place]) 04/09/2013  . 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 Protein-calorie malnutrition, severe ([place]) 04/09/2013  . Gangrene of toe ([place]) 04/08/2013  . Toe gangrene ([place]) 04/08/2013  . Nausea with vomiting 05/22/2012  . S/P BKA (below knee amputation) unilateral, left 05/20/2012  . Musculoskeletal chest pain 05/20/2012  . Acute renal failure ([place]) 05/19/2012  . CAD (coronary artery disease) with CABG 05/19/2012  . DM type 2 causing vascular disease ([place]) 05/19/2012  . Hyperkalemia 05/19/2012    Past Surgical History:  Procedure Laterality Date  . A/V FISTULAGRAM Left 05/28/2016   Procedure: A/V Fistulagram;  Surgeon: Katha Cabal, MD;  Location: [place] CV LAB;  Service: Cardiovascular;  Laterality: Left;  . A/V FISTULAGRAM Left 10/28/2016   Procedure: A/V Fistulagram;  Surgeon: Algernon Huxley, MD;  Location: [place] [facility];  Service: Cardiovascular;  Laterality: Left;  . A/V FISTULAGRAM Left 03/03/2017   Procedure: A/V FISTULAGRAM;  Surgeon: Algernon Huxley, MD;  Location: [place] [facility] LAB;  Service: Cardiovascular;  Laterality: Left;  .  A/V SHUNT INTERVENTION N/A 05/28/2016   Procedure: A/V Shunt Intervention;  Surgeon: Katha Cabal, MD;  Location: [place] [facility] LAB;  Service: Cardiovascular;  Laterality: N/A;  . A/V SHUNT INTERVENTION N/A 03/03/2017   Procedure: A/V SHUNT INTERVENTION;  Surgeon: Algernon Huxley, MD;  Location: [place] CV LAB;  Service: Cardiovascular;  Laterality: N/A;  . AMPUTATION Right 04/10/2013   Procedure: Right Below Knee Amputation;  Surgeon: Newt Minion, MD;   Location: [place];  Service: Orthopedics;  Laterality: Right;  Right Below Knee Amputation  . APPENDECTOMY    . BELOW KNEE LEG AMPUTATION Bilateral   . CHOLECYSTECTOMY    . CORONARY ARTERY BYPASS GRAFT  1996   [place]  . ESOPHAGOGASTRODUODENOSCOPY Left 05/24/2012   GOT:LXBWIOMB dilated baggy but otherwise a normal/ Small hiatal hernia. Gastric ulcer -S/P biopsy  . LEFT HEART CATH AND CORS/GRAFTS ANGIOGRAPHY N/A 02/12/2017   Procedure: LEFT HEART CATH AND CORS/GRAFTS ANGIOGRAPHY;  Surgeon: Lorretta Harp, MD;  Location: [place] CV LAB;  Service: Cardiovascular;  Laterality: N/A;  . PERIPHERAL VASCULAR CATHETERIZATION N/A 08/09/2014   Procedure: A/V Shuntogram/Fistulagram;  Surgeon: Katha Cabal, MD;  Location: [place] [facility] LAB;  Service: Cardiovascular;  Laterality: N/A;  . PERIPHERAL VASCULAR CATHETERIZATION Left 08/09/2014   Procedure: A/V Shunt Intervention;  Surgeon: Katha Cabal, MD;  Location: [place] [facility] LAB;  Service: Cardiovascular;  Laterality: Left;        Home Medications    Prior to Admission medications   Medication Sig Start Date End Date Taking? Authorizing Provider  acetaminophen (TYLENOL) 500 MG tablet Take 1,000 mg by mouth at bedtime. *May take daily if needed but takes mostly at bedtime   Yes [provider]  aspirin EC 81 MG tablet Take 81 mg by mouth daily.    Yes [provider]  atorvastatin (LIPITOR) 20 MG tablet Take 20 mg by mouth at bedtime.  01/02/18  Yes [provider]  B Complex-C-Folic Acid (NEPHRO-VITE PO) Take 1 tablet by mouth at bedtime.    Yes [provider]  budesonide-formoterol (SYMBICORT) 80-4.5 MCG/ACT inhaler Inhale 2 puffs into the lungs 2 (two) times daily as needed (for wheezing/shortness of breath.).   Yes [provider]  cinacalcet (SENSIPAR) 30 MG tablet Take 30 mg by mouth at bedtime.    Yes [provider]  Coenzyme Q10 (COQ10) 100 MG CAPS Take 100 mg by mouth at  bedtime.   Yes [provider]  diphenhydrAMINE (BENADRYL) 25 mg capsule Take 25 mg by mouth at bedtime.    Yes [provider]  furosemide (LASIX) 20 MG tablet Take 20 mg by mouth daily.   Yes [provider]  isosorbide mononitrate (IMDUR) 30 MG 24 hr tablet Take one half of 1 tablet twice a day 03/27/18  Yes Milton Ferguson, MD  LANTUS SOLOSTAR 100 UNIT/ML Solostar Pen Inject 9 Units into the skin at bedtime.  12/21/13  Yes [provider]  Liniments (SALONPAS PAIN RELIEF PATCH EX) Place 1 patch onto the skin daily as needed (for pain relief.).    Yes [provider]  lisinopril (PRINIVIL,ZESTRIL) 5 MG tablet Take 5 mg by mouth daily.   Yes [provider]  nitroGLYCERIN (NITROSTAT) 0.4 MG SL tablet Place 1 tablet (0.4 mg total) under the tongue every 5 (five) minutes as needed for chest pain. 11/18/14  Yes Samuella Cota, MD  omega-3 acid ethyl esters (LOVAZA) 1 g capsule Take 1 g by mouth  daily.   Yes [provider]  omeprazole (PRILOSEC) 20 MG capsule Take 1 capsule by mouth 2 (two) times daily. 03/20/17  Yes [provider]  sevelamer carbonate (RENVELA) 800 MG tablet Take 800 mg by mouth 3 (three) times daily with meals.  01/07/18  Yes [provider]  Amino Acid Infusion (PROSOL) 20 % SOLN Inject 1 vial into the vein once a week. 02/28/17   [provider]    Family History Family History  Problem Relation Age of Onset  . Cancer Mother   . Colon cancer Neg Hx     Social History Social History   Tobacco Use  . Smoking status: Former Smoker    Last attempt to quit: 08/08/2004    Years since quitting: 13.7  . Smokeless tobacco: Never Used  Substance Use Topics  . Alcohol use: No  . Drug use: No     Allergies   Codeine and Yellow jacket venom [bee venom]   Review of Systems Review of Systems  All other systems reviewed and are negative.    Physical Exam Updated Vital Signs BP (!)  164/79 (BP Location: Right Arm)   Pulse 81   Temp 97.9 F (36.6 C) (Oral)   Resp 18   Ht 5' (1.524 m)   Wt 63 kg   SpO2 100%   BMI 27.13 kg/m   Physical Exam Vitals signs and nursing note reviewed.  Constitutional:      Appearance: He is well-developed.     Comments: nad  HENT:     Head: Normocephalic and atraumatic.  Eyes:     Conjunctiva/sclera: Conjunctivae normal.  Neck:     Musculoskeletal: Neck supple.  Cardiovascular:     Rate and Rhythm: Normal rate and regular rhythm.  Pulmonary:     Effort: Pulmonary effort is normal.     Breath sounds: Normal breath sounds.  Abdominal:     General: Bowel sounds are normal.     Palpations: Abdomen is soft.  Genitourinary:    Comments: Rectal exam:  No masses, ? weakly heme pos. Musculoskeletal: Normal range of motion.  Skin:    General: Skin is warm and dry.  Neurological:     Mental Status: He is alert and oriented to person, place, and time.  Psychiatric:        Behavior: Behavior normal.      ED Treatments / Results  Labs (all labs ordered are listed, but only abnormal results are displayed) Labs Reviewed  CBC WITH DIFFERENTIAL/PLATELET - Abnormal; Notable for the following components:      Result Value   RBC 3.61 (*)    Hemoglobin 11.2 (*)    HCT 35.1 (*)    All other components within normal limits  BASIC METABOLIC PANEL - Abnormal; Notable for the following components:   Potassium 2.8 (*)    Chloride 97 (*)    Glucose, Bld 134 (*)    Creatinine, Ser 1.96 (*)    Calcium 7.9 (*)    GFR calc non Af Amer 32 (*)    GFR calc Af Amer 37 (*)    All other components within normal limits  POC OCCULT BLOOD, ED - Abnormal; Notable for the following components:   Fecal Occult Bld POSITIVE (*)    All other components within normal limits  TYPE AND SCREEN    EKG None  Radiology No results found.  Procedures Procedures (including critical care time)  Medications Ordered in ED Medications  pantoprazole  (  PROTONIX) injection 20 mg (20 mg Intravenous Given 05/04/18 1651)     Initial Impression / Assessment and Plan / ED Course  I have reviewed the triage vital signs and the nursing notes.  Pertinent labs & imaging results that were available during my care of the patient were reviewed by me and considered in my medical decision making (see chart for details).        Patient presents with a questionable blood in his vomit and/or bowel movements.  Physical exam reveals no acute distress.  Rectal questionable heme positive.  Hemoglobin stable at 11.2.  No episodes of blood noted in the ED after several hours.  Patient Rahul get outpatient follow-up.  Final Clinical Impressions(s) / ED Diagnoses   Final diagnoses:  Heme positive stool  ESRD (end stage renal disease) [facility])    ED Discharge Orders    None       Nat Christen, MD 05/07/18 [id number]

## 2018-06-01 ENCOUNTER — Emergency Department (HOSPITAL_COMMUNITY)
Admission: EM | Admit: 2018-06-01 | Discharge: 2018-06-01 | Disposition: A | Discharge status: Home / Self Care | Payer: Medicare Other | Attending: Emergency Medicine | Admitting: Emergency Medicine

## 2018-06-01 ENCOUNTER — Other Ambulatory Visit: Payer: Self-pay

## 2018-06-01 ENCOUNTER — Encounter (HOSPITAL_COMMUNITY): Payer: Self-pay

## 2018-06-01 ENCOUNTER — Emergency Department (HOSPITAL_COMMUNITY): Payer: Medicare Other

## 2018-06-01 DIAGNOSIS — Z87891 Personal history of nicotine dependence: Secondary | ICD-10-CM | POA: Diagnosis not present

## 2018-06-01 DIAGNOSIS — I2510 Atherosclerotic heart disease of native coronary artery without angina pectoris: Secondary | ICD-10-CM | POA: Diagnosis not present

## 2018-06-01 DIAGNOSIS — J45909 Unspecified asthma, uncomplicated: Secondary | ICD-10-CM | POA: Insufficient documentation

## 2018-06-01 DIAGNOSIS — N186 End stage renal disease: Secondary | ICD-10-CM | POA: Diagnosis not present

## 2018-06-01 DIAGNOSIS — R0789 Other chest pain: Secondary | ICD-10-CM | POA: Diagnosis present

## 2018-06-01 DIAGNOSIS — R079 Chest pain, unspecified: Secondary | ICD-10-CM

## 2018-06-01 DIAGNOSIS — Z8673 Personal history of transient ischemic attack (TIA), and cerebral infarction without residual deficits: Secondary | ICD-10-CM | POA: Diagnosis not present

## 2018-06-01 DIAGNOSIS — I120 Hypertensive chronic kidney disease with stage 5 chronic kidney disease or end stage renal disease: Secondary | ICD-10-CM | POA: Insufficient documentation

## 2018-06-01 DIAGNOSIS — Z951 Presence of aortocoronary bypass graft: Secondary | ICD-10-CM | POA: Insufficient documentation

## 2018-06-01 LAB — COMPREHENSIVE METABOLIC PANEL
ALT: 14 U/L (ref 0–44)
AST: 11 U/L — ABNORMAL LOW (ref 15–41)
Albumin: 2.7 g/dL — ABNORMAL LOW (ref 3.5–5.0)
Alkaline Phosphatase: 64 U/L (ref 38–126)
Anion gap: 10 (ref 5–15)
BUN: 61 mg/dL — ABNORMAL HIGH (ref 8–23)
CO2: 27 mmol/L (ref 22–32)
Calcium: 8.9 mg/dL (ref 8.9–10.3)
Chloride: 98 mmol/L (ref 98–111)
Creatinine, Ser: 4.49 mg/dL — ABNORMAL HIGH (ref 0.61–1.24)
GFR calc Af Amer: 13 mL/min — ABNORMAL LOW (ref >60)
GFR calc non Af Amer: 12 mL/min — ABNORMAL LOW (ref >60)
Glucose, Bld: 173 mg/dL — ABNORMAL HIGH (ref 70–99)
Potassium: 4.5 mmol/L (ref 3.5–5.1)
Sodium: 135 mmol/L (ref 135–145)
Total Bilirubin: 0.8 mg/dL (ref 0.3–1.2)
Total Protein: 6.1 g/dL — ABNORMAL LOW (ref 6.5–8.1)

## 2018-06-01 LAB — CBC
HCT: 35.9 % — ABNORMAL LOW (ref 39.0–52.0)
Hemoglobin: 11.3 g/dL — ABNORMAL LOW (ref 13.0–17.0)
MCH: 30.8 pg (ref 26.0–34.0)
MCHC: 31.5 g/dL (ref 30.0–36.0)
MCV: 97.8 fL (ref 80.0–100.0)
Platelets: 184 10*3/uL (ref 150–400)
RBC: 3.67 MIL/uL — ABNORMAL LOW (ref 4.22–5.81)
RDW: 13 % (ref 11.5–15.5)
WBC: 5.5 10*3/uL (ref 4.0–10.5)
nRBC: 0 % (ref 0.0–0.2)

## 2018-06-01 LAB — LACTIC ACID, PLASMA: Lactic Acid, Venous: 1.8 mmol/L (ref 0.5–1.9)

## 2018-06-01 LAB — TROPONIN I
Troponin I: <0.03 ng/mL (ref <0.03)
Troponin I: <0.03 ng/mL (ref <0.03)

## 2018-06-01 LAB — BRAIN NATRIURETIC PEPTIDE: B Natriuretic Peptide: 215 pg/mL — ABNORMAL HIGH (ref 0.0–100.0)

## 2018-06-01 LAB — LIPASE, BLOOD: Lipase: 26 U/L (ref 11–51)

## 2018-06-01 MED ORDER — FENTANYL CITRATE (PF) 100 MCG/2ML IJ SOLN
50.0000 ug | Freq: Once | INTRAMUSCULAR | Status: AC
  Administered 2018-06-01: 50 ug via INTRAVENOUS
  Filled 2018-06-01: qty 2

## 2018-06-01 NOTE — Progress Notes (Signed)
Patient discussed with ER staff Dr Sedonia Small. 78 yo male history of CAD with prior CABG and PCI, HTN, DM2, PAD with bilateral amputations, ESRD and chronic chest pain typically associatd with dialysis sessions.   Admit 01/2017 with angina. Cath showed  Mid RCA 50%, RPLB second branch 90%, OM1 70%, LCX 100%. Occluded LIMA-LAD, occluded SV-OM1. Recs for medical therapy of his OM disease  He has experienced recurrent admissions for atypical chest pain and his pain does seem atypical by history   Presents with atypical chest pain constant x 5 days, similar to his chronic pain. Trop is negative. EKG SR without acute ischemic changes. CXR/abd film perhaps mild pulm edema, large colonic stool burden   Essentially in the absence of objective evidence of ischemia his chronic chest pain has been managed medically. Would check second trop in neg ok for discharge. Can increase imdur to 30mg  in AM and 15mg  in PM    Carlyle Dolly MD

## 2018-06-01 NOTE — ED Notes (Signed)
ED Provider at bedside. 

## 2018-06-01 NOTE — Discharge Instructions (Addendum)
You were evaluated in the Emergency Department and after careful evaluation, we did not find any emergent condition requiring admission or further testing in the hospital.  Your tests today were reassuring.  We discussed your case with the cardiologist, and they are recommending any change to her medication.  Please take 30 mg of your Imdur medication in the morning, and please also take 15 mg of this medication at night.  Please return to the Emergency Department if you experience any worsening of your condition.  We encourage you to follow up with a primary care provider.  Thank you for allowing [place] to be a part of your care.

## 2018-06-01 NOTE — ED Provider Notes (Signed)
[facility] Emergency Department Provider Note MRN:  [id number]  Arrival date & time: 06/01/18     Chief Complaint   Abdominal Pain   History of Present Illness   Alan [name] is a 78 y.o. year-old male with a history of CAD, CVA, ESRD presenting to the ED with chief complaint of chest pain.  Pain located in the left chest, non-radiating, sharp, constant for the past 5 days.  Denies SOB, no cough, no fever.  Denies numbness or weakness.  Also endorsing abdominal pain, patient does not know how long this has been present.  Cannot specify location of abdominal pain.  Denies dizziness, no nausea, no vomiting.  Symptoms are constant, no exacerbation or alleviating factors.  Picked up by EMS from dialysis.   Review of Systems  A complete 10 system review of systems was obtained and all systems are negative except as noted in the HPI and PMH.   Patient's Health History    Past Medical History:  Diagnosis Date  . Arthritis   . Asthma   . Clotted renal dialysis arteriovenous graft ([place])   . Coronary artery disease    a. s/p CABG in 1996 with LIMA-LAD and SVG-OM1 b. 01/2017: cath showing occluded SVG-OM1 and atretic LIMA-LAD. Diffuse disease along 1st Mrg and PDA with medical therapy recommended.   . CVA (cerebral infarction)   . DDD (degenerative disc disease), cervical   . DDD (degenerative disc disease), lumbar   . Depression   . Diabetes mellitus   . ESRD (end stage renal disease) on dialysis ([place])   . Gangrene of toe ([place]) Feb. 2015   Right  great    . Gastric ulcer    EGD 2014  . Gout   . Hypercholesteremia   . Hypertension   . Sternal wound dehiscence    chronic  . Stroke ([place])   . 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 Thrombosis of renal dialysis arteriovenous graft [facility])     Past Surgical History:  Procedure Laterality Date  . A/V FISTULAGRAM Left 05/28/2016   Procedure: A/V Fistulagram;  Surgeon: Katha Cabal, MD;  Location: [place] [facility]  [facility];  Service: Cardiovascular;  Laterality: Left;  . A/V FISTULAGRAM Left 10/28/2016   Procedure: A/V Fistulagram;  Surgeon: Algernon Huxley, MD;  Location: [place] [facility];  Service: Cardiovascular;  Laterality: Left;  . A/V FISTULAGRAM Left 03/03/2017   Procedure: A/V FISTULAGRAM;  Surgeon: Algernon Huxley, MD;  Location: [place] [facility];  Service: Cardiovascular;  Laterality: Left;  . A/V SHUNT INTERVENTION N/A 05/28/2016   Procedure: A/V Shunt Intervention;  Surgeon: Katha Cabal, MD;  Location: [place] [facility];  Service: Cardiovascular;  Laterality: N/A;  . A/V SHUNT INTERVENTION N/A 03/03/2017   Procedure: A/V SHUNT INTERVENTION;  Surgeon: Algernon Huxley, MD;  Location: [place] [facility];  Service: Cardiovascular;  Laterality: N/A;  . AMPUTATION Right 04/10/2013   Procedure: Right Below Knee Amputation;  Surgeon: Newt Minion, MD;  Location: [place];  Service: Orthopedics;  Laterality: Right;  Right Below Knee Amputation  . APPENDECTOMY    . BELOW KNEE LEG AMPUTATION Bilateral   . CHOLECYSTECTOMY    . CORONARY ARTERY BYPASS GRAFT  1996   [place]  . ESOPHAGOGASTRODUODENOSCOPY Left 05/24/2012   OIZ:[id number] dilated baggy but otherwise a normal/ [name] hiatal hernia. Gastric ulcer -S/P biopsy  . LEFT HEART CATH AND CORS/GRAFTS ANGIOGRAPHY N/A 02/12/2017   Procedure: LEFT HEART CATH AND CORS/GRAFTS ANGIOGRAPHY;  Surgeon:  Lorretta Harp, MD;  Location: [place] [address] [facility];  Service: Cardiovascular;  Laterality: N/A;  . PERIPHERAL VASCULAR CATHETERIZATION N/A 08/09/2014   Procedure: A/V Shuntogram/Fistulagram;  Surgeon: Katha Cabal, MD;  Location: [place] [facility];  Service: Cardiovascular;  Laterality: N/A;  . PERIPHERAL VASCULAR CATHETERIZATION Left 08/09/2014   Procedure: A/V Shunt Intervention;  Surgeon: Katha Cabal, MD;  Location: [place] [facility];  Service: Cardiovascular;  Laterality: Left;    Family History  Problem Relation Age of Onset  . Cancer Mother   .  Colon cancer Neg Hx     Social History   Socioeconomic History  . Marital status: Married    Spouse name: Not on file  . Number of children: Not on file  . Years of education: Not on file  . Highest education level: Not on file  Occupational History  . Not on file  Social Needs  . Financial resource strain: Not on file  . Food insecurity:    Worry: Not on file    Inability: Not on file  . Transportation needs:    Medical: Not on file    Non-medical: Not on file  Tobacco Use  . Smoking status: Former Smoker    Last attempt to quit: 08/08/2004    Years since quitting: 13.8  . Smokeless tobacco: Never Used  Substance and Sexual Activity  . Alcohol use: No  . Drug use: No  . Sexual activity: Not Currently  Lifestyle  . Physical activity:    Days per week: Not on file    Minutes per session: Not on file  . Stress: Not on file  Relationships  . Social connections:    Talks on phone: Not on file    Gets together: Not on file    Attends religious service: Not on file    Active member of club or organization: Not on file    Attends meetings of clubs or organizations: Not on file    Relationship status: Not on file  . Intimate partner violence:    Fear of current or ex partner: Not on file    Emotionally abused: Not on file    Physically abused: Not on file    Forced sexual activity: Not on file  Other Topics Concern  . Not on file  Social History Narrative  . Not on file     Physical Exam  Vital Signs and Nursing Notes reviewed Vitals:   06/01/18 1245 06/01/18 1300  BP:  (!) 246/92  Pulse: 73   Resp: (!) 21 18  Temp:    SpO2: 100%     CONSTITUTIONAL:  Chronically ill-appearing, NAD NEURO:  Alert and oriented x 3, no focal deficits EYES:  eyes equal and reactive ENT/NECK:  no LAD, no JVD CARDIO:  regular rate, well-perfused, normal S1 and S2 PULM:  CTAB no wheezing or rhonchi GI/GU:  normal bowel sounds, non-distended, non-tender MSK/SPINE:  BL BKA, no edema  SKIN:  no rash, atraumatic PSYCH:  Appropriate speech and behavior  Diagnostic and Interventional Summary    EKG Interpretation  Date/Time:  Monday June 01 2018 10:50:45 EDT Ventricular Rate:  72 PR Interval:    QRS Duration: 87 QT Interval:  408 QTC Calculation: 447 R Axis:   -5 Text Interpretation:  Sinus rhythm Anteroseptal infarct, old Confirmed by Gerlene Fee [phone]) on 06/01/2018 11:55:19 AM      Labs Reviewed  CBC - Abnormal; Notable for the following components:  Result Value   RBC 3.67 (*)    Hemoglobin 11.3 (*)    HCT 35.9 (*)    All other components within normal limits  COMPREHENSIVE METABOLIC PANEL - Abnormal; Notable for the following components:   Glucose, Bld 173 (*)    BUN 61 (*)    Creatinine, Ser 4.49 (*)    Total Protein 6.1 (*)    Albumin 2.7 (*)    AST 11 (*)    GFR calc non Af Amer 12 (*)    GFR calc Af Amer 13 (*)    All other components within normal limits  BRAIN NATRIURETIC PEPTIDE - Abnormal; Notable for the following components:   B Natriuretic Peptide 215.0 (*)    All other components within normal limits  LIPASE, BLOOD  LACTIC ACID, PLASMA  TROPONIN I  TROPONIN I  URINALYSIS, ROUTINE W REFLEX MICROSCOPIC    DG ABD ACUTE 2+V W 1V CHEST  Final Result      Medications  fentaNYL (SUBLIMAZE) injection 50 mcg (50 mcg Intravenous Given 06/01/18 1106)     Procedures Critical Care  ED Course and Medical Decision Making  I have reviewed the triage vital signs and the nursing notes.  Pertinent labs & imaging results that were available during my care of the patient were reviewed by me and considered in my medical decision making (see below for details).  Considering ACS given patient's numerous risk factors.  Normal neuro exam, pain is sharp and without associated symptoms making this atypical pain.  Abdomen is soft though patient had recent ED visit for blood in stool.  EKG without acute concerns, workup pending.  Given  patient's history of demand ischemia during dialysis and fairly recent admissions and cardiology consultations for this very similar presentation here today, cardiology was consulted for recommendations regarding maximizing medical management.  Cardiology recommending delta troponin, increase to home Imdur medication.  Troponin is negative x2, patient's pain is completely resolved and has been for 2 to 3 hours here in the ED.  Patient's labs are reassuring, clinically is not fluid overloaded, potassium is within normal limits, patient explains that he received 3 hours of dialysis today.  Seems appropriate to just follow-up with his normal HD schedule on Wednesday.  After the discussed management above, the patient was determined to be safe for discharge.  The patient was in agreement with this plan and all questions regarding their care were answered.  ED return precautions were discussed and the patient Alan [name] return to the ED with any significant worsening of condition.  Alan [name], [place] [email or web address]  Final Clinical Impressions(s) / ED Diagnoses     ICD-10-CM   1. Chest pain R07.9 DG ABD ACUTE 2+V W 1V CHEST    DG ABD ACUTE 2+V W 1V CHEST    ED Discharge Orders    None         Maudie Flakes, MD 06/01/18 1456

## 2018-06-01 NOTE — ED Notes (Signed)
Pt was informed that we need a urine sample. Pt was given a urinal.

## 2018-06-01 NOTE — ED Notes (Signed)
Daughter notified of pt's discharge.  Called back and no answer.

## 2018-06-01 NOTE — ED Triage Notes (Signed)
Pt was at dialysis. EMS called out for chest pain. Nurse at Dialysis said he had been leaning to the right. Left sided weakness noticed by EMS, but this is normal for patient. Is having left sided pain. VSS. Negative by EMS for stroke screen. NSR on monitor. Pt did not get all of his dialysis today. Been there since 7:30 am

## 2018-06-01 NOTE — ED Notes (Signed)
Called wife and daughter's contact number and no answer at both. CN made aware of unable to contact anyone for pt's discharge at this time.

## 2018-06-19 ENCOUNTER — Other Ambulatory Visit: Payer: Self-pay

## 2018-06-19 ENCOUNTER — Encounter (HOSPITAL_COMMUNITY): Payer: Self-pay | Admitting: Emergency Medicine

## 2018-06-19 ENCOUNTER — Emergency Department (HOSPITAL_COMMUNITY): Payer: Medicare Other

## 2018-06-19 ENCOUNTER — Emergency Department (HOSPITAL_COMMUNITY)
Admission: EM | Admit: 2018-06-19 | Discharge: 2018-06-19 | Disposition: A | Discharge status: Home / Self Care | Payer: Medicare Other | Attending: Emergency Medicine | Admitting: Emergency Medicine

## 2018-06-19 DIAGNOSIS — I120 Hypertensive chronic kidney disease with stage 5 chronic kidney disease or end stage renal disease: Secondary | ICD-10-CM | POA: Diagnosis not present

## 2018-06-19 DIAGNOSIS — Z89611 Acquired absence of right leg above knee: Secondary | ICD-10-CM | POA: Insufficient documentation

## 2018-06-19 DIAGNOSIS — R079 Chest pain, unspecified: Secondary | ICD-10-CM | POA: Diagnosis present

## 2018-06-19 DIAGNOSIS — Z79899 Other long term (current) drug therapy: Secondary | ICD-10-CM | POA: Diagnosis not present

## 2018-06-19 DIAGNOSIS — F17200 Nicotine dependence, unspecified, uncomplicated: Secondary | ICD-10-CM | POA: Diagnosis not present

## 2018-06-19 DIAGNOSIS — N186 End stage renal disease: Secondary | ICD-10-CM | POA: Insufficient documentation

## 2018-06-19 DIAGNOSIS — Z89612 Acquired absence of left leg above knee: Secondary | ICD-10-CM | POA: Insufficient documentation

## 2018-06-19 DIAGNOSIS — I2510 Atherosclerotic heart disease of native coronary artery without angina pectoris: Secondary | ICD-10-CM | POA: Diagnosis not present

## 2018-06-19 DIAGNOSIS — E1122 Type 2 diabetes mellitus with diabetic chronic kidney disease: Secondary | ICD-10-CM | POA: Diagnosis not present

## 2018-06-19 DIAGNOSIS — Z992 Dependence on renal dialysis: Secondary | ICD-10-CM | POA: Insufficient documentation

## 2018-06-19 LAB — CBC WITH DIFFERENTIAL/PLATELET
Abs Immature Granulocytes: 0.1 10*3/uL — ABNORMAL HIGH (ref 0.00–0.07)
Basophils Absolute: 0 10*3/uL (ref 0.0–0.1)
Basophils Relative: 0 %
Eosinophils Absolute: 0.1 10*3/uL (ref 0.0–0.5)
Eosinophils Relative: 1 %
HCT: 34.6 % — ABNORMAL LOW (ref 39.0–52.0)
Hemoglobin: 11.4 g/dL — ABNORMAL LOW (ref 13.0–17.0)
Immature Granulocytes: 1 %
Lymphocytes Relative: 21 %
Lymphs Abs: 2.2 10*3/uL (ref 0.7–4.0)
MCH: 31.2 pg (ref 26.0–34.0)
MCHC: 32.9 g/dL (ref 30.0–36.0)
MCV: 94.8 fL (ref 80.0–100.0)
Monocytes Absolute: 0.4 10*3/uL (ref 0.1–1.0)
Monocytes Relative: 4 %
Neutro Abs: 7.4 10*3/uL (ref 1.7–7.7)
Neutrophils Relative %: 73 %
Platelets: 166 10*3/uL (ref 150–400)
RBC: 3.65 MIL/uL — ABNORMAL LOW (ref 4.22–5.81)
RDW: 12.6 % (ref 11.5–15.5)
WBC: 10.2 10*3/uL (ref 4.0–10.5)
nRBC: 0 % (ref 0.0–0.2)

## 2018-06-19 LAB — BASIC METABOLIC PANEL
Anion gap: 9 (ref 5–15)
BUN: 34 mg/dL — ABNORMAL HIGH (ref 8–23)
CO2: 29 mmol/L (ref 22–32)
Calcium: 7.9 mg/dL — ABNORMAL LOW (ref 8.9–10.3)
Chloride: 98 mmol/L (ref 98–111)
Creatinine, Ser: 2.49 mg/dL — ABNORMAL HIGH (ref 0.61–1.24)
GFR calc Af Amer: 27 mL/min — ABNORMAL LOW (ref >60)
GFR calc non Af Amer: 24 mL/min — ABNORMAL LOW (ref >60)
Glucose, Bld: 254 mg/dL — ABNORMAL HIGH (ref 70–99)
Potassium: 2.8 mmol/L — ABNORMAL LOW (ref 3.5–5.1)
Sodium: 136 mmol/L (ref 135–145)

## 2018-06-19 LAB — TROPONIN I
Troponin I: 0.03 ng/mL (ref <0.03)
Troponin I: 0.03 ng/mL (ref <0.03)

## 2018-06-19 MED ORDER — POTASSIUM CHLORIDE CRYS ER 20 MEQ PO TBCR
40.0000 meq | EXTENDED_RELEASE_TABLET | Freq: Once | ORAL | Status: AC
  Administered 2018-06-19: 20 meq via ORAL
  Filled 2018-06-19: qty 2

## 2018-06-19 NOTE — ED Notes (Signed)
Daughter Alan [name] [phone]

## 2018-06-19 NOTE — ED Notes (Signed)
Spoke with daughter to notify her that pt is ready for discharge, states she [place] let her mom know to come to pick up patient.

## 2018-06-19 NOTE — ED Notes (Signed)
Have notified daughter that patient is almost ready to be picked up.

## 2018-06-19 NOTE — ED Provider Notes (Signed)
Second troponin 0.03.   Patient Alan [name] be discharged per recommendation of Dr. Severiano Gilbert, Aaron Edelman, MD 06/19/18 [phone]

## 2018-06-19 NOTE — ED Provider Notes (Signed)
[facility] EMERGENCY DEPARTMENT Provider Note   CSN: [id number] Arrival date & time: 06/19/18  1251    History   Chief Complaint Chief Complaint  Patient presents with   Chest Pain    HPI Alan [name] is a 78 y.o. male.     Patient is a 78 year old male with history of coronary artery disease with CABG in 1996 as well as heart cath in December of 2018 showing occlusions of the grafts that were treated medically.  He presents today for evaluation of chest pain.  He was at dialysis for approximately 2 hours when he developed sharp pains to the front of his chest.  He denies any nausea or shortness of breath.  He was given sublingual nitroglycerin and aspirin and the pain resolved.  He is currently pain-free.  The history is provided by the patient.  Chest Pain  Pain location:  Substernal area Pain quality: sharp   Pain radiates to:  Does not radiate Pain severity:  Moderate Timing:  Constant Progression:  Resolved Chronicity:  New Relieved by:  Nothing Worsened by:  Nothing Ineffective treatments:  None tried   Past Medical History:  Diagnosis Date   Arthritis    Asthma    Clotted renal dialysis arteriovenous graft ([place])    Coronary artery disease    a. s/p CABG in 1996 with LIMA-LAD and SVG-OM1 b. 01/2017: cath showing occluded SVG-OM1 and atretic LIMA-LAD. Diffuse disease along 1st Mrg and PDA with medical therapy recommended.    CVA (cerebral infarction)    DDD (degenerative disc disease), cervical    DDD (degenerative disc disease), lumbar    Depression    Diabetes mellitus    ESRD (end stage renal disease) on dialysis ([place])    Gangrene of toe ([place]) Feb. 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   Thrombosis of renal dialysis arteriovenous graft [facility])     Patient Active Problem List   Diagnosis Date Noted   Angina at rest [facility]) 01/23/2018   Hemodialysis-associated hypotension    Left sided numbness 02/10/2017   Hypertensive urgency 02/10/2017   Hypokalemia 02/10/2017   Chest pain 06/15/2016   Renal dialysis device, implant, or graft complication 42/87/6811   Septic shock ([place]) 08/28/2015   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   Lower urinary tract infectious disease    Essential hypertension 08/13/2015   Nausea and vomiting 08/13/2015   ESRD (end stage renal disease) ([place]) 04/12/2015   Gastroenteritis 04/12/2015   S/P bilateral BKA (below knee amputation) ([place]) 04/12/2015   HLD (hyperlipidemia) 04/12/2015   Acute chest pain 11/16/2014   Atherosclerosis of native arteries of the extremities with gangrene ([place]) 04/20/2013   Sepsis ([place]) 04/13/2013   Gram-negative bacteremia 04/12/2013   ESRD on dialysis ([place]) 04/09/2013   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   Protein-calorie malnutrition, severe ([place]) 04/09/2013   Gangrene of toe ([place]) 04/08/2013   Toe gangrene ([place]) 04/08/2013   Nausea with vomiting 05/22/2012   S/P BKA (below knee amputation) unilateral, left 05/20/2012   Musculoskeletal chest pain 05/20/2012   Acute renal failure ([place]) 05/19/2012   CAD (coronary artery disease) with CABG 05/19/2012   DM type 2 causing vascular disease ([place]) 05/19/2012   Hyperkalemia 05/19/2012    Past Surgical History:  Procedure Laterality Date  A/V FISTULAGRAM Left 05/28/2016   Procedure: A/V Fistulagram;  Surgeon: Katha Cabal, MD;  Location: [place] [facility];  Service: Cardiovascular;  Laterality: Left;   A/V FISTULAGRAM Left 10/28/2016   Procedure: A/V Fistulagram;  Surgeon: Algernon Huxley, MD;  Location: [place] [facility];  Service: Cardiovascular;  Laterality: Left;   A/V FISTULAGRAM Left 03/03/2017   Procedure: A/V FISTULAGRAM;  Surgeon: Algernon Huxley, MD;  Location: [place] [facility];  Service: Cardiovascular;   Laterality: Left;   A/V SHUNT INTERVENTION N/A 05/28/2016   Procedure: A/V Shunt Intervention;  Surgeon: Katha Cabal, MD;  Location: [place] [facility];  Service: Cardiovascular;  Laterality: N/A;   A/V SHUNT INTERVENTION N/A 03/03/2017   Procedure: A/V SHUNT INTERVENTION;  Surgeon: Algernon Huxley, MD;  Location: [place] [facility];  Service: Cardiovascular;  Laterality: N/A;   AMPUTATION Right 04/10/2013   Procedure: Right Below Knee Amputation;  Surgeon: Newt Minion, MD;  Location: [place];  Service: Orthopedics;  Laterality: Right;  Right Below Knee Amputation   APPENDECTOMY     BELOW KNEE LEG AMPUTATION Bilateral    CHOLECYSTECTOMY     CORONARY ARTERY BYPASS GRAFT  1996   [facility]   ESOPHAGOGASTRODUODENOSCOPY Left 05/24/2012   [id number] dilated baggy but otherwise a normal/ Small hiatal hernia. Gastric ulcer -S/P biopsy   LEFT HEART CATH AND CORS/GRAFTS ANGIOGRAPHY N/A 02/12/2017   Procedure: LEFT HEART CATH AND CORS/GRAFTS ANGIOGRAPHY;  Surgeon: Lorretta Harp, MD;  Location: [place] [facility];  Service: Cardiovascular;  Laterality: N/A;   PERIPHERAL VASCULAR CATHETERIZATION N/A 08/09/2014   Procedure: A/V Shuntogram/Fistulagram;  Surgeon: Katha Cabal, MD;  Location: [place] [facility];  Service: Cardiovascular;  Laterality: N/A;   PERIPHERAL VASCULAR CATHETERIZATION Left 08/09/2014   Procedure: A/V Shunt Intervention;  Surgeon: Katha Cabal, MD;  Location: [place] [facility];  Service: Cardiovascular;  Laterality: Left;        Home Medications    Prior to Admission medications   Medication Sig Start Date End Date Taking? Authorizing Provider  acetaminophen (TYLENOL) 500 MG tablet Take 1,000 mg by mouth at bedtime. *May take daily if needed but takes mostly at bedtime    [provider]  Amino Acid Infusion (PROSOL) 20 % SOLN Inject 1 vial into the vein once a week. 02/28/17   [provider]  aspirin EC 81 MG tablet Take 81 mg by  mouth daily.     [provider]  atorvastatin (LIPITOR) 20 MG tablet Take 20 mg by mouth at bedtime.  01/02/18   [provider]  B Complex-C-Folic Acid (NEPHRO-VITE PO) Take 1 tablet by mouth at bedtime.     [provider]  budesonide-formoterol (SYMBICORT) 80-4.5 MCG/ACT inhaler Inhale 2 puffs into the lungs 2 (two) times daily as needed (for wheezing/shortness of breath.).    [provider]  cinacalcet (SENSIPAR) 30 MG tablet Take 30 mg by mouth at bedtime.     [provider]  Coenzyme Q10 (COQ10) 100 MG CAPS Take 100 mg by mouth at bedtime.    [provider]  diphenhydrAMINE (BENADRYL) 25 mg capsule Take 25 mg by mouth at bedtime.     [provider]  furosemide (LASIX) 20 MG tablet Take 20 mg by mouth daily.    [provider]  isosorbide mononitrate (IMDUR) 30 MG 24 hr tablet Take one half of 1 tablet twice a day Patient taking differently: Take 15 mg by mouth  2 (two) times daily. Take one half of 1 tablet twice a day 03/27/18   [place] Ferguson, MD  LANTUS SOLOSTAR 100 UNIT/ML Solostar Pen Inject 9 Units into the skin at bedtime.  12/21/13   [provider]  Liniments (SALONPAS PAIN RELIEF PATCH EX) Place 1 patch onto the skin daily as needed (for pain relief.).     [provider]  lisinopril (PRINIVIL,ZESTRIL) 5 MG tablet Take 5 mg by mouth daily.    [provider]  nitroGLYCERIN (NITROSTAT) 0.4 MG SL tablet Place 1 tablet (0.4 mg total) under the tongue every 5 (five) minutes as needed for chest pain. 11/18/14   Samuella Cota, MD  omega-3 acid ethyl esters (LOVAZA) 1 g capsule Take 1 g by mouth daily.    [provider]  omeprazole (PRILOSEC) 20 MG capsule Take 1 capsule by mouth 2 (two) times daily. 03/20/17   [provider]  sevelamer carbonate (RENVELA) 800 MG tablet Take 800 mg by mouth 3 (three) times daily with meals.  01/07/18   [provider]     Family History Family History  Problem Relation Age of Onset   Cancer Mother    Colon cancer Neg Hx     Social History Social History   Tobacco Use   Smoking status: Former Smoker    Last attempt to quit: 08/08/2004    Years since quitting: 13.8   Smokeless tobacco: Never Used  Substance Use Topics   Alcohol use: No   Drug use: No     Allergies   Codeine and Yellow jacket venom [bee venom]   Review of Systems Review of Systems  Cardiovascular: Positive for chest pain.  All other systems reviewed and are negative.    Physical Exam Updated Vital Signs BP 122/64 (BP Location: Right Arm)    Pulse 80    Temp 98.7 F (37.1 C) (Oral)    Resp 16    Ht 5\' 8"  (1.727 m)    Wt 90.7 kg    SpO2 100%    BMI 30.41 kg/m   Physical Exam Vitals signs and nursing note reviewed.  Constitutional:      General: He is not in acute distress.    Appearance: He is well-developed. He is not diaphoretic.  HENT:     Head: Normocephalic and atraumatic.  Neck:     Musculoskeletal: Normal range of motion and neck supple.  Cardiovascular:     Rate and Rhythm: Normal rate and regular rhythm.     Heart sounds: No murmur. No friction rub.  Pulmonary:     Effort: Pulmonary effort is normal. No respiratory distress.     Breath sounds: Normal breath sounds. No wheezing or rales.  Abdominal:     General: Bowel sounds are normal. There is no distension.     Palpations: Abdomen is soft.     Tenderness: There is no abdominal tenderness.  Musculoskeletal: Normal range of motion.     Comments: Patient is status post bilateral above-the-knee amputation  Skin:    General: Skin is warm and dry.  Neurological:     Mental Status: He is alert and oriented to person, place, and time.     Coordination: Coordination normal.      ED Treatments / Results  Labs (all labs ordered are listed, but only abnormal results are displayed) Labs Reviewed  BASIC METABOLIC PANEL  CBC WITH  DIFFERENTIAL/PLATELET  TROPONIN I    EKG EKG Interpretation  Date/Time:  Friday  Jun 19 2018 12:54:42 EDT Ventricular Rate:  78 PR Interval:    QRS Duration: 91 QT Interval:  384 QTC Calculation: 438 R Axis:   91 Text Interpretation:  Sinus rhythm Right axis deviation Abnormal R-wave progression, early transition Nonspecific repol abnormality, diffuse leads Confirmed by Veryl Speak [phone]) on 06/19/2018 1:05:28 PM   Radiology No results found.  Procedures Procedures (including critical care time)  Medications Ordered in ED Medications - No data to display   Initial Impression / Assessment and Plan / ED Course  I have reviewed the triage vital signs and the nursing notes.  Pertinent labs & imaging results that were available during my care of the patient were reviewed by me and considered in my medical decision making (see chart for details).  Patient with history of end-stage renal disease on hemodialysis and history of coronary artery disease with CABG 20 years ago.  He presents today with chest pain that started while at dialysis.  It lasted approximately 30 minutes and was relieved with nitroglycerin and aspirin.  Patient arrived here pain-free and has been pain-free throughout his emergency department stay.    His initial EKG had changes that were somewhat nonspecific and resolved upon repeat EKG.  This EKG was discussed with Dr. Margaretann Loveless from cardiology.  She is recommending a delta troponin and repeat EKG.  If these look okay patient Raedyn likely be discharged.  Care signed out to Dr. Lacinda Axon at shift change.  Patient comfortable and in agreement with plan.  Final Clinical Impressions(s) / ED Diagnoses   Final diagnoses:  None    ED Discharge Orders    None       Veryl Speak, MD 06/19/18 1500

## 2018-06-19 NOTE — ED Notes (Signed)
CRITICAL VALUE ALERT  Critical Value:  Troponin 0.03  Date & Time Notied:  06/19/2018 @ 9728  Provider Notified: Dr. Stark Jock  Orders Received/Actions taken: see new order

## 2018-06-19 NOTE — Discharge Instructions (Addendum)
Heart tests were stable.  Follow-up with your cardiologist.

## 2018-06-19 NOTE — ED Triage Notes (Signed)
C/o chest pain during dialysis 0.6 liter pulled.  Given ntg 0.4 mg sl x1 and 324 mg ASA.  Pt is pain free at this time.  History of chest pain.  Denies any symptoms at this time.

## 2018-07-17 ENCOUNTER — Encounter (HOSPITAL_COMMUNITY): Payer: Self-pay

## 2018-07-17 ENCOUNTER — Emergency Department (HOSPITAL_COMMUNITY): Payer: Medicare Other

## 2018-07-17 ENCOUNTER — Emergency Department (HOSPITAL_COMMUNITY)
Admission: EM | Admit: 2018-07-17 | Discharge: 2018-07-17 | Disposition: A | Discharge status: Home / Self Care | Payer: Medicare Other | Attending: Emergency Medicine | Admitting: Emergency Medicine

## 2018-07-17 ENCOUNTER — Other Ambulatory Visit: Payer: Self-pay

## 2018-07-17 DIAGNOSIS — R079 Chest pain, unspecified: Secondary | ICD-10-CM | POA: Diagnosis present

## 2018-07-17 DIAGNOSIS — R1013 Epigastric pain: Secondary | ICD-10-CM | POA: Diagnosis not present

## 2018-07-17 DIAGNOSIS — Z992 Dependence on renal dialysis: Secondary | ICD-10-CM | POA: Insufficient documentation

## 2018-07-17 DIAGNOSIS — E119 Type 2 diabetes mellitus without complications: Secondary | ICD-10-CM | POA: Insufficient documentation

## 2018-07-17 DIAGNOSIS — I2510 Atherosclerotic heart disease of native coronary artery without angina pectoris: Secondary | ICD-10-CM | POA: Diagnosis not present

## 2018-07-17 DIAGNOSIS — I120 Hypertensive chronic kidney disease with stage 5 chronic kidney disease or end stage renal disease: Secondary | ICD-10-CM | POA: Insufficient documentation

## 2018-07-17 DIAGNOSIS — N186 End stage renal disease: Secondary | ICD-10-CM | POA: Insufficient documentation

## 2018-07-17 DIAGNOSIS — Z79899 Other long term (current) drug therapy: Secondary | ICD-10-CM | POA: Diagnosis not present

## 2018-07-17 LAB — CBC
HCT: 34.4 % — ABNORMAL LOW (ref 39.0–52.0)
Hemoglobin: 11 g/dL — ABNORMAL LOW (ref 13.0–17.0)
MCH: 30.4 pg (ref 26.0–34.0)
MCHC: 32 g/dL (ref 30.0–36.0)
MCV: 95 fL (ref 80.0–100.0)
Platelets: 154 10*3/uL (ref 150–400)
RBC: 3.62 MIL/uL — ABNORMAL LOW (ref 4.22–5.81)
RDW: 12.5 % (ref 11.5–15.5)
WBC: 8.1 10*3/uL (ref 4.0–10.5)
nRBC: 0.6 % — ABNORMAL HIGH (ref 0.0–0.2)

## 2018-07-17 LAB — BASIC METABOLIC PANEL
Anion gap: 11 (ref 5–15)
BUN: 27 mg/dL — ABNORMAL HIGH (ref 8–23)
CO2: 26 mmol/L (ref 22–32)
Calcium: 8 mg/dL — ABNORMAL LOW (ref 8.9–10.3)
Chloride: 99 mmol/L (ref 98–111)
Creatinine, Ser: 2.64 mg/dL — ABNORMAL HIGH (ref 0.61–1.24)
GFR calc Af Amer: 26 mL/min — ABNORMAL LOW (ref >60)
GFR calc non Af Amer: 22 mL/min — ABNORMAL LOW (ref >60)
Glucose, Bld: 230 mg/dL — ABNORMAL HIGH (ref 70–99)
Potassium: 3.6 mmol/L (ref 3.5–5.1)
Sodium: 136 mmol/L (ref 135–145)

## 2018-07-17 LAB — HEPATIC FUNCTION PANEL
ALT: 11 U/L (ref 0–44)
AST: 12 U/L — ABNORMAL LOW (ref 15–41)
Albumin: 2.3 g/dL — ABNORMAL LOW (ref 3.5–5.0)
Alkaline Phosphatase: 57 U/L (ref 38–126)
Bilirubin, Direct: 0.2 mg/dL (ref 0.0–0.2)
Indirect Bilirubin: 1.1 mg/dL — ABNORMAL HIGH (ref 0.3–0.9)
Total Bilirubin: 1.3 mg/dL — ABNORMAL HIGH (ref 0.3–1.2)
Total Protein: 5.5 g/dL — ABNORMAL LOW (ref 6.5–8.1)

## 2018-07-17 LAB — TROPONIN I: Troponin I: 0.03 ng/mL (ref <0.03)

## 2018-07-17 LAB — LIPASE, BLOOD: Lipase: 28 U/L (ref 11–51)

## 2018-07-17 MED ORDER — BISMUTH SUBSALICYLATE 262 MG/15ML PO SUSP
30.0000 mL | Freq: Once | ORAL | Status: AC
  Administered 2018-07-17: 30 mL via ORAL
  Filled 2018-07-17: qty 118

## 2018-07-17 MED ORDER — SODIUM CHLORIDE 0.9 % IV BOLUS
250.0000 mL | Freq: Once | INTRAVENOUS | Status: AC
  Administered 2018-07-17: 13:00:00 via INTRAVENOUS

## 2018-07-17 NOTE — ED Triage Notes (Signed)
Pt c/o substernal chest pain radiating to r side.  Reports pain started approx 1 hour after starting dialysis treatment.   CBG 225  Pt denies any fever or sob.

## 2018-07-17 NOTE — ED Provider Notes (Signed)
[facility] EMERGENCY DEPARTMENT Provider Note   CSN: [id number] Arrival date & time: 07/17/18  1220    History   Chief Complaint Chief Complaint  Patient presents with  . Chest Pain    HPI Alan [name] is a 78 y.o. male.     HPI  He presents for evaluation of chest abdominal pain which started during dialysis today.  He states he ate this morning, but did not take his morning medicines before dialysis.  He was eating well yesterday and did not have any discomfort.  He was in the ED with similar pain about 4 weeks ago also started on dialysis.  He was evaluated for cardiac disease and discharged.  He reports ongoing cough for several days productive of green sputum.  He denies blood in the sputum.  He denies fever, chills, shortness of breath, weakness or dizziness.  He has been compliant with dialysis and his medications by his report.  He does not have any known sick contacts.  There are no other known modifying factors.   Past Medical History:  Diagnosis Date  . Arthritis   . Asthma   . Clotted renal dialysis arteriovenous graft ([place])   . Coronary artery disease    a. s/p CABG in 1996 with LIMA-LAD and SVG-OM1 b. 01/2017: cath showing occluded SVG-OM1 and atretic LIMA-LAD. Diffuse disease along 1st Mrg and PDA with medical therapy recommended.   . CVA (cerebral infarction)   . DDD (degenerative disc disease), cervical   . DDD (degenerative disc disease), lumbar   . Depression   . Diabetes mellitus   . 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 Gangrene of toe ([place]) Feb. 2015   Right  great    . Gastric ulcer    EGD 2014  . Gout   . Hypercholesteremia   . Hypertension   . Sternal wound dehiscence    chronic  . Stroke ([place])   . 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 Thrombosis of renal dialysis arteriovenous graft [facility])     Patient Active Problem List   Diagnosis Date Noted  . Angina at rest [facility]) 01/23/2018  . Hemodialysis-associated hypotension    . Left sided numbness 02/10/2017  . Hypertensive urgency 02/10/2017  . Hypokalemia 02/10/2017  . Chest pain 06/15/2016  . Renal dialysis device, implant, or graft complication 28/31/5176  . Septic shock ([place]) 08/28/2015  . 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 Lower urinary tract infectious disease   . Essential hypertension 08/13/2015  . Nausea and vomiting 08/13/2015  . ESRD (end stage renal disease) ([place]) 04/12/2015  . Gastroenteritis 04/12/2015  . S/P bilateral BKA (below knee amputation) ([place]) 04/12/2015  . HLD (hyperlipidemia) 04/12/2015  . Acute chest pain 11/16/2014  . Atherosclerosis of native arteries of the extremities with gangrene ([place]) 04/20/2013  . Sepsis ([place]) 04/13/2013  . Gram-negative bacteremia 04/12/2013  . ESRD on dialysis ([place]) 04/09/2013  . 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 Protein-calorie malnutrition, severe ([place]) 04/09/2013  . Gangrene of toe ([place]) 04/08/2013  . Toe gangrene ([place]) 04/08/2013  . Nausea with vomiting 05/22/2012  . S/P BKA (below knee amputation) unilateral, left 05/20/2012  . Musculoskeletal chest pain 05/20/2012  . Acute renal failure ([place]) 05/19/2012  . CAD (coronary artery disease) with CABG 05/19/2012  . DM type 2 causing vascular disease ([place]) 05/19/2012  . Hyperkalemia 05/19/2012    Past Surgical History:  Procedure Laterality Date  . A/V FISTULAGRAM Left 05/28/2016   Procedure: A/V Fistulagram;  Surgeon: Belenda Cruise  Eloise Levels, MD;  Location: [place] CV LAB;  Service: Cardiovascular;  Laterality: Left;  . A/V FISTULAGRAM Left 10/28/2016   Procedure: A/V Fistulagram;  Surgeon: Algernon Huxley, MD;  Location: [place] CV LAB;  Service: Cardiovascular;  Laterality: Left;  . A/V FISTULAGRAM Left 03/03/2017   Procedure: A/V FISTULAGRAM;  Surgeon: Algernon Huxley, MD;  Location: [place] CV LAB;  Service: Cardiovascular;  Laterality: Left;  . A/V SHUNT INTERVENTION N/A 05/28/2016   Procedure: A/V Shunt Intervention;   Surgeon: Katha Cabal, MD;  Location: [place] CV LAB;  Service: Cardiovascular;  Laterality: N/A;  . A/V SHUNT INTERVENTION N/A 03/03/2017   Procedure: A/V SHUNT INTERVENTION;  Surgeon: Algernon Huxley, MD;  Location: [place] CV LAB;  Service: Cardiovascular;  Laterality: N/A;  . AMPUTATION Right 04/10/2013   Procedure: Right Below Knee Amputation;  Surgeon: Newt Minion, MD;  Location: [place];  Service: Orthopedics;  Laterality: Right;  Right Below Knee Amputation  . APPENDECTOMY    . BELOW KNEE LEG AMPUTATION Bilateral   . CHOLECYSTECTOMY    . CORONARY ARTERY BYPASS GRAFT  1996   [place]  . ESOPHAGOGASTRODUODENOSCOPY Left 05/24/2012   NTI:RWERXVQM dilated baggy but otherwise a normal/ Small hiatal hernia. Gastric ulcer -S/P biopsy  . LEFT HEART CATH AND CORS/GRAFTS ANGIOGRAPHY N/A 02/12/2017   Procedure: LEFT HEART CATH AND CORS/GRAFTS ANGIOGRAPHY;  Surgeon: Lorretta Harp, MD;  Location: [place] CV LAB;  Service: Cardiovascular;  Laterality: N/A;  . PERIPHERAL VASCULAR CATHETERIZATION N/A 08/09/2014   Procedure: A/V Shuntogram/Fistulagram;  Surgeon: Katha Cabal, MD;  Location: [place] CV LAB;  Service: Cardiovascular;  Laterality: N/A;  . PERIPHERAL VASCULAR CATHETERIZATION Left 08/09/2014   Procedure: A/V Shunt Intervention;  Surgeon: Katha Cabal, MD;  Location: [place] CV LAB;  Service: Cardiovascular;  Laterality: Left;        Home Medications    Prior to Admission medications   Medication Sig Start Date End Date Taking? Authorizing Provider  acetaminophen (TYLENOL) 500 MG tablet Take 1,000 mg by mouth at bedtime. *May take daily if needed but takes mostly at bedtime    [provider]  Amino Acid Infusion (PROSOL) 20 % SOLN Inject 1 vial into the vein once a week. 02/28/17   [provider]  aspirin EC 81 MG tablet Take 81 mg by mouth daily.     [provider]  atorvastatin (LIPITOR) 20 MG tablet Take 20 mg by mouth  at bedtime.  01/02/18   [provider]  B Complex-C-Folic Acid (NEPHRO-VITE PO) Take 1 tablet by mouth at bedtime.     [provider]  budesonide-formoterol (SYMBICORT) 80-4.5 MCG/ACT inhaler Inhale 2 puffs into the lungs 2 (two) times daily as needed (for wheezing/shortness of breath.).    [provider]  cinacalcet (SENSIPAR) 30 MG tablet Take 30 mg by mouth at bedtime.     [provider]  Coenzyme Q10 (COQ10) 100 MG CAPS Take 100 mg by mouth at bedtime.    [provider]  diphenhydrAMINE (BENADRYL) 25 mg capsule Take 25 mg by mouth at bedtime.     [provider]  furosemide (LASIX) 20 MG tablet Take 20 mg by mouth daily.    [provider]  isosorbide mononitrate (IMDUR) 30 MG 24 hr tablet Take one half of 1 tablet twice a day Patient taking differently: Take 15 mg by mouth 2 (two) times daily. Take one half of 1 tablet twice a  day 03/27/18   Milton Ferguson, MD  LANTUS SOLOSTAR 100 UNIT/ML Solostar Pen Inject 9 Units into the skin at bedtime.  12/21/13   [provider]  Liniments (SALONPAS PAIN RELIEF PATCH EX) Place 1 patch onto the skin daily as needed (for pain relief.).     [provider]  lisinopril (PRINIVIL,ZESTRIL) 5 MG tablet Take 5 mg by mouth daily.    [provider]  nitroGLYCERIN (NITROSTAT) 0.4 MG SL tablet Place 1 tablet (0.4 mg total) under the tongue every 5 (five) minutes as needed for chest pain. 11/18/14   Samuella Cota, MD  omega-3 acid ethyl esters (LOVAZA) 1 g capsule Take 1 g by mouth daily.    [provider]  omeprazole (PRILOSEC) 20 MG capsule Take 1 capsule by mouth 2 (two) times a day.  03/20/17   [provider]  sevelamer carbonate (RENVELA) 800 MG tablet Take 800 mg by mouth 3 (three) times daily with meals.  01/07/18   [provider]    Family History Family History  Problem Relation Age of Onset  . Cancer Mother   . Colon cancer Neg  Hx     Social History Social History   Tobacco Use  . Smoking status: Former Smoker    Last attempt to quit: 08/08/2004    Years since quitting: 13.9  . Smokeless tobacco: Never Used  Substance Use Topics  . Alcohol use: No  . Drug use: No     Allergies   Codeine and Yellow jacket venom [bee venom]   Review of Systems Review of Systems  All other systems reviewed and are negative.    Physical Exam Updated Vital Signs BP (!) 160/87   Pulse 77   Temp 97.6 F (36.4 C) (Oral)   Resp 16   Wt 90.7 kg   SpO2 100%   BMI 30.40 kg/m   Physical Exam Vitals signs and nursing note reviewed.  Constitutional:      General: He is not in acute distress.    Appearance: He is well-developed. He is not ill-appearing, toxic-appearing or diaphoretic.  HENT:     Head: Normocephalic and atraumatic.     Right Ear: External ear normal.     Left Ear: External ear normal.     Nose: Nose normal.     Mouth/Throat:     Mouth: Mucous membranes are moist.     Pharynx: Posterior oropharyngeal erythema present. No oropharyngeal exudate.  Eyes:     Conjunctiva/sclera: Conjunctivae normal.     Pupils: Pupils are equal, round, and reactive to light.  Neck:     Musculoskeletal: Normal range of motion and neck supple.     Trachea: Phonation normal.  Cardiovascular:     Rate and Rhythm: Normal rate and regular rhythm.     Heart sounds: Normal heart sounds.  Pulmonary:     Effort: Pulmonary effort is normal. No respiratory distress.     Breath sounds: Normal breath sounds. No stridor.  Chest:     Chest wall: No tenderness.  Abdominal:     General: There is no distension.     Palpations: Abdomen is soft. There is no mass.     Tenderness: There is abdominal tenderness (Epigastric, mild). There is no guarding or rebound.     Hernia: No hernia is present.  Musculoskeletal: Normal range of motion.     Comments: He has bilateral BKA's.  Legs do not appear to have edema.  Skin:  General:  Skin is warm and dry.  Neurological:     Mental Status: He is alert and oriented to person, place, and time.     Cranial Nerves: No cranial nerve deficit.     Sensory: No sensory deficit.     Motor: No abnormal muscle tone.     Coordination: Coordination normal.  Psychiatric:        Mood and Affect: Mood normal.        Behavior: Behavior normal.        Thought Content: Thought content normal.        Judgment: Judgment normal.      ED Treatments / Results  Labs (all labs ordered are listed, but only abnormal results are displayed) Labs Reviewed  BASIC METABOLIC PANEL - Abnormal; Notable for the following components:      Result Value   Glucose, Bld 230 (*)    BUN 27 (*)    Creatinine, Ser 2.64 (*)    Calcium 8.0 (*)    GFR calc non Af Amer 22 (*)    GFR calc Af Amer 26 (*)    All other components within normal limits  CBC - Abnormal; Notable for the following components:   RBC 3.62 (*)    Hemoglobin 11.0 (*)    HCT 34.4 (*)    nRBC 0.6 (*)    All other components within normal limits  TROPONIN I - Abnormal; Notable for the following components:   Troponin I 0.03 (*)    All other components within normal limits  HEPATIC FUNCTION PANEL - Abnormal; Notable for the following components:   Total Protein 5.5 (*)    Albumin 2.3 (*)    AST 12 (*)    Total Bilirubin 1.3 (*)    Indirect Bilirubin 1.1 (*)    All other components within normal limits  LIPASE, BLOOD    EKG EKG Interpretation  Date/Time:  Friday Jul 17 2018 12:29:27 EDT Ventricular Rate:  80 PR Interval:    QRS Duration: 88 QT Interval:  404 QTC Calculation: 466 R Axis:   -3 Text Interpretation:  Sinus rhythm Borderline repolarization abnormality since last tracing no significant change Confirmed by Daleen Bo [phone]) on 07/17/2018 12:34:12 PM   Radiology Dg Chest Portable 1 View  Result Date: 07/17/2018 CLINICAL DATA:  Chest pain. EXAM: PORTABLE CHEST 1 VIEW COMPARISON:  06/19/2018.  05/04/2018.   01/23/2018. FINDINGS: Prior CABG. Cardiomegaly. No pulmonary venous congestion. Mild bibasilar atelectasis and/or pleural-parenchymal scarring. Small left pleural effusion. No pneumothorax. IMPRESSION: 1.  Prior CABG.  Stable cardiomegaly. 2.  Mild bibasilar atelectasis and or pleuroparenchymal scarring. Electronically Signed   By: Marcello Moores  Register   On: 07/17/2018 12:56    Procedures .Critical Care Performed by: Daleen Bo, MD Authorized by: Daleen Bo, MD   Critical care provider statement:    Critical care time (minutes):  35   Critical care start time:  07/17/2018 12:30 PM   Critical care end time:  07/17/2018 3:12 PM   Critical care time was exclusive of:  Separately billable procedures and treating other patients   Critical care was necessary to treat or prevent imminent or life-threatening deterioration of the following conditions:  Circulatory failure   Critical care was time spent personally by me on the following activities:  Blood draw for specimens, development of treatment plan with patient or surrogate, discussions with consultants, evaluation of patient's response to treatment, examination of patient, obtaining history from patient or surrogate, ordering and performing treatments  and interventions, ordering and review of laboratory studies, pulse oximetry, re-evaluation of patient's condition, review of old charts and ordering and review of radiographic studies   (including critical care time)  Medications Ordered in ED Medications  sodium chloride 0.9 % bolus 250 mL (0 mLs Intravenous Stopped 07/17/18 3729)  bismuth subsalicylate (PEPTO BISMOL) 262 MG/15ML suspension 30 mL (30 mLs Oral Given 07/17/18 1301)     Initial Impression / Assessment and Plan / ED Course  I have reviewed the triage vital signs and the nursing notes.  Pertinent labs & imaging results that were available during my care of the patient were reviewed by me and considered in my medical decision  making (see chart for details).  Clinical Course as of Jul 17 1510  Fri Jul 17, 2018  1324 No infiltrate or CHF, image reviewed by me  DG Chest Portable 1 View [EW]  1429 Borderline elevated  Troponin I - ONCE - STAT(!!) [EW]  1429 Normal except protein low, albumin low, AST low, total bilirubin high, indirect bilirubin high  Hepatic function panel(!) [EW]  1429 Normal except hemoglobin low  CBC(!) [EW]  1429 Normal  Lipase, blood [EW]  1429 Normal except glucose high, BUN high, creatinine high, calcium low, GFR low  Basic metabolic panel(!) [EW]    Clinical Course User Index [EW] Daleen Bo, MD        Patient Vitals for the past 24 hrs:  BP Temp Temp src Pulse Resp SpO2 Weight  07/17/18 1430 (!) 160/87 - - 77 16 100 % -  07/17/18 1400 (!) 194/88 - - 73 17 100 % -  07/17/18 1330 (!) 157/70 - - 76 20 99 % -  07/17/18 1300 (!) 148/67 - - 79 19 100 % -  07/17/18 1226 (!) 98/51 97.6 F (36.4 C) Oral 81 16 99 % -  07/17/18 1223 - - - - - - 90.7 kg    3:10 PM Reevaluation with update and discussion. After initial assessment and treatment, an updated evaluation reveals he is comfortable has no further complaints.  He feels better after Pepto-Bismol.  Findings discussed and questions answered. [place] Decision Making: Nonspecific chest and abdominal pain with reassuring evaluation.  Doubt ACS, cute intra-abdominal infection, hemodynamic instability or metabolic instability.  Patient was noted to have mild hypotension on arrival, postdialysis, a common occurrence.  His blood pressure improved after treatment with IV fluid.  Doubt serious bacterial infection  CRITICAL CARE-yes Performed by: Daleen Bo   Nursing Notes Reviewed/ Care Coordinated Applicable Imaging Reviewed Interpretation of Laboratory Data incorporated into ED treatment  The patient appears reasonably screened and/or stabilized for discharge and I doubt any other medical condition or other [facility]  requiring further screening, evaluation, or treatment in the ED at this time prior to discharge.  Plan: Home Medications-continue usual; Home Treatments-rest, fluids, advance diet, regular dialysis; return here if the recommended treatment, does not improve the symptoms; Recommended follow up-PCP, PRN     Final Clinical Impressions(s) / ED Diagnoses   Final diagnoses:  Nonspecific chest pain  Epigastric pain    ED Discharge Orders    None       Daleen Bo, MD 07/17/18 1512

## 2018-07-17 NOTE — ED Notes (Signed)
CRITICAL VALUE ALERT  Critical Value:  Troponin 0.03  Date & Time Notied:  07/17/2018, 1409  Provider Notified: Dr. Eulis Foster  Orders Received/Actions taken: see chart

## 2018-07-17 NOTE — ED Notes (Signed)
Family notified of discharge.

## 2018-07-17 NOTE — ED Notes (Signed)
Ems says they gave 1 nitro but no relief.  Reports facility gave heparin so they did not give any asa.

## 2018-07-17 NOTE — Discharge Instructions (Addendum)
The testing today did not show any problems with your heart or abdomen.  It is safe to take Pepto-Bismol 2 or 3 times a day for similar pain which you had today.  Continue your usual dialysis treatment and take all of your medicines as directed including the medicines that you should be taking, this morning.  Return here, or see your doctor if needed for problems.

## 2018-07-21 ENCOUNTER — Ambulatory Visit: Payer: Medicare Other | Admitting: Orthopaedic Surgery

## 2018-07-28 ENCOUNTER — Ambulatory Visit: Payer: Medicare Other | Admitting: Orthopaedic Surgery

## 2018-07-30 ENCOUNTER — Ambulatory Visit: Payer: Medicare Other | Admitting: Orthopaedic Surgery

## 2018-09-03 ENCOUNTER — Other Ambulatory Visit: Payer: Self-pay | Admitting: Orthopaedic Surgery

## 2018-09-03 ENCOUNTER — Ambulatory Visit (INDEPENDENT_AMBULATORY_CARE_PROVIDER_SITE_OTHER): Payer: Medicare Other | Admitting: Orthopaedic Surgery

## 2018-09-03 ENCOUNTER — Other Ambulatory Visit: Payer: Self-pay

## 2018-09-03 ENCOUNTER — Encounter: Payer: Self-pay | Admitting: Orthopaedic Surgery

## 2018-09-03 ENCOUNTER — Ambulatory Visit (INDEPENDENT_AMBULATORY_CARE_PROVIDER_SITE_OTHER): Payer: Medicare Other

## 2018-09-03 VITALS — BP 122/59 | HR 73 | Temp 98.5°F

## 2018-09-03 DIAGNOSIS — G8929 Other chronic pain: Secondary | ICD-10-CM

## 2018-09-03 DIAGNOSIS — M25511 Pain in right shoulder: Secondary | ICD-10-CM

## 2018-09-03 DIAGNOSIS — N186 End stage renal disease: Secondary | ICD-10-CM

## 2018-09-03 DIAGNOSIS — I10 Essential (primary) hypertension: Secondary | ICD-10-CM | POA: Diagnosis not present

## 2018-09-03 DIAGNOSIS — Z794 Long term (current) use of insulin: Secondary | ICD-10-CM

## 2018-09-03 DIAGNOSIS — M25512 Pain in left shoulder: Secondary | ICD-10-CM

## 2018-09-03 DIAGNOSIS — E1142 Type 2 diabetes mellitus with diabetic polyneuropathy: Secondary | ICD-10-CM | POA: Diagnosis not present

## 2018-09-03 DIAGNOSIS — Z89512 Acquired absence of left leg below knee: Secondary | ICD-10-CM

## 2018-09-03 DIAGNOSIS — Z89511 Acquired absence of right leg below knee: Secondary | ICD-10-CM

## 2018-09-03 DIAGNOSIS — Z992 Dependence on renal dialysis: Secondary | ICD-10-CM

## 2018-09-03 NOTE — Progress Notes (Addendum)
Subjective:    Patient ID: Alan [name], male    DOB: [birth year], 78 y.o.   MRN: [id number]  HPI He has long history of bilateral shoulder pain.  He is post amputations both legs below knee secondary to severe diabetes.  He is on renal dialysis three times a week.  He has hypertension.  He is confined to a wheelchair, manual type.He has pain in moving his shoulders that is getting worse.  He has no trauma, no redness, no numbness.  He is on multiple medicines.  I have reviewed the notes from [facility].     Review of Systems  Constitutional: Positive for activity change.  Respiratory: Positive for shortness of breath.   Cardiovascular: Positive for chest pain.  Genitourinary: Positive for decreased urine volume.  Musculoskeletal: Positive for arthralgias, gait problem and joint swelling.  All other systems reviewed and are negative.  For Review of Systems, all other systems reviewed and are negative.  The following is a summary of the past history medically, past history surgically, known current medicines, social history and family history.  This information is gathered electronically by the computer from prior information and documentation.  I review this each visit and have found including this information at this point in the chart is beneficial and informative.   Past Medical History:  Diagnosis Date  . Arthritis   . Asthma   . Clotted renal dialysis arteriovenous graft ([place])   . Coronary artery disease    a. s/p CABG in 1996 with LIMA-LAD and SVG-OM1 b. 01/2017: cath showing occluded SVG-OM1 and atretic LIMA-LAD. Diffuse disease along 1st Mrg and PDA with medical therapy recommended.   . CVA (cerebral infarction)   . DDD (degenerative disc disease), cervical   . DDD (degenerative disc disease), lumbar   . Depression   . Diabetes mellitus   . ESRD (end stage renal disease) on dialysis ([place])   . Gangrene of toe ([place]) Feb. 2015   Right  great    . Gastric ulcer    EGD 2014  . Gout    . Hypercholesteremia   . Hypertension   . Sternal wound dehiscence    chronic  . Stroke ([place])   . 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 Thrombosis of renal dialysis arteriovenous graft [facility])     Past Surgical History:  Procedure Laterality Date  . A/V FISTULAGRAM Left 05/28/2016   Procedure: A/V Fistulagram;  Surgeon: Katha Cabal, MD;  Location: [place] [facility];  Service: Cardiovascular;  Laterality: Left;  . A/V FISTULAGRAM Left 10/28/2016   Procedure: A/V Fistulagram;  Surgeon: Algernon Huxley, MD;  Location: [place] [facility];  Service: Cardiovascular;  Laterality: Left;  . A/V FISTULAGRAM Left 03/03/2017   Procedure: A/V FISTULAGRAM;  Surgeon: Algernon Huxley, MD;  Location: [place] [facility];  Service: Cardiovascular;  Laterality: Left;  . A/V SHUNT INTERVENTION N/A 05/28/2016   Procedure: A/V Shunt Intervention;  Surgeon: Katha Cabal, MD;  Location: [place] [facility];  Service: Cardiovascular;  Laterality: N/A;  . A/V SHUNT INTERVENTION N/A 03/03/2017   Procedure: A/V SHUNT INTERVENTION;  Surgeon: Algernon Huxley, MD;  Location: [place] [facility];  Service: Cardiovascular;  Laterality: N/A;  . AMPUTATION Right 04/10/2013   Procedure: Right Below Knee Amputation;  Surgeon: Newt Minion, MD;  Location: [place];  Service: Orthopedics;  Laterality: Right;  Right Below Knee Amputation  . APPENDECTOMY    . BELOW KNEE LEG AMPUTATION Bilateral   .  CHOLECYSTECTOMY    . CORONARY ARTERY BYPASS GRAFT  1996   [place]  . ESOPHAGOGASTRODUODENOSCOPY Left 05/24/2012   OVF:IEPPIRJJ dilated baggy but otherwise a normal/ Small hiatal hernia. Gastric ulcer -S/P biopsy  . LEFT HEART CATH AND CORS/GRAFTS ANGIOGRAPHY N/A 02/12/2017   Procedure: LEFT HEART CATH AND CORS/GRAFTS ANGIOGRAPHY;  Surgeon: Lorretta Harp, MD;  Location: [place] [facility];  Service: Cardiovascular;  Laterality: N/A;  . PERIPHERAL VASCULAR CATHETERIZATION N/A 08/09/2014   Procedure: A/V Shuntogram/Fistulagram;   Surgeon: Katha Cabal, MD;  Location: [place] [facility];  Service: Cardiovascular;  Laterality: N/A;  . PERIPHERAL VASCULAR CATHETERIZATION Left 08/09/2014   Procedure: A/V Shunt Intervention;  Surgeon: Katha Cabal, MD;  Location: [place] [facility];  Service: Cardiovascular;  Laterality: Left;    Current Outpatient Medications on File Prior to Visit  Medication Sig Dispense Refill  . acetaminophen (TYLENOL) 500 MG tablet Take 1,000 mg by mouth at bedtime. *May take daily if needed but takes mostly at bedtime    . Amino Acid Infusion (PROSOL) 20 % SOLN Inject 1 vial into the vein once a week.    Marland Kitchen aspirin EC 81 MG tablet Take 81 mg by mouth daily.     Marland Kitchen atorvastatin (LIPITOR) 20 MG tablet Take 20 mg by mouth at bedtime.     . B Complex-C-Folic Acid (NEPHRO-VITE PO) Take 1 tablet by mouth at bedtime.     . budesonide-formoterol (SYMBICORT) 80-4.5 MCG/ACT inhaler Inhale 2 puffs into the lungs 2 (two) times daily as needed (for wheezing/shortness of breath.).    Marland Kitchen cinacalcet (SENSIPAR) 30 MG tablet Take 30 mg by mouth at bedtime.     . Coenzyme Q10 (COQ10) 100 MG CAPS Take 100 mg by mouth at bedtime.    . diphenhydrAMINE (BENADRYL) 25 mg capsule Take 25 mg by mouth at bedtime.     . furosemide (LASIX) 20 MG tablet Take 20 mg by mouth daily.    . isosorbide mononitrate (IMDUR) 30 MG 24 hr tablet Take one half of 1 tablet twice a day (Patient taking differently: Take 15 mg by mouth 2 (two) times daily. Take one half of 1 tablet twice a day) 30 tablet 0  . LANTUS SOLOSTAR 100 UNIT/ML Solostar Pen Inject 9 Units into the skin at bedtime.     . Liniments (SALONPAS PAIN RELIEF PATCH EX) Place 1 patch onto the skin daily as needed (for pain relief.).     Marland Kitchen lisinopril (PRINIVIL,ZESTRIL) 5 MG tablet Take 5 mg by mouth daily.    . nitroGLYCERIN (NITROSTAT) 0.4 MG SL tablet Place 1 tablet (0.4 mg total) under the tongue every 5 (five) minutes as needed for chest pain. 30 tablet 0  . omega-3  acid ethyl esters (LOVAZA) 1 g capsule Take 1 g by mouth daily.    Marland Kitchen omeprazole (PRILOSEC) 20 MG capsule Take 1 capsule by mouth 2 (two) times a day.   0  . sevelamer carbonate (RENVELA) 800 MG tablet Take 800 mg by mouth 3 (three) times daily with meals.      No current facility-administered medications on file prior to visit.     Social History   Socioeconomic History  . Marital status: Married    Spouse name: Not on file  . Number of children: Not on file  . Years of education: Not on file  . Highest education level: Not on file  Occupational History  . Not on file  Social Needs  . Emergency planning/management officer  strain: Not on file  . Food insecurity    Worry: Not on file    Inability: Not on file  . Transportation needs    Medical: Not on file    Non-medical: Not on file  Tobacco Use  . Smoking status: Former Smoker    Quit date: 08/08/2004    Years since quitting: 14.0  . Smokeless tobacco: Never Used  Substance and Sexual Activity  . Alcohol use: No  . Drug use: No  . Sexual activity: Not Currently  Lifestyle  . Physical activity    Days per week: Not on file    Minutes per session: Not on file  . Stress: Not on file  Relationships  . Social Herbalist on phone: Not on file    Gets together: Not on file    Attends religious service: Not on file    Active member of club or organization: Not on file    Attends meetings of clubs or organizations: Not on file    Relationship status: Not on file  . Intimate partner violence    Fear of current or ex partner: Not on file    Emotionally abused: Not on file    Physically abused: Not on file    Forced sexual activity: Not on file  Other Topics Concern  . Not on file  Social History Narrative  . Not on file    Family History  Problem Relation Age of Onset  . Cancer Mother   . Colon cancer Neg Hx     BP (!) 122/59   Pulse 73   Temp 98.5 F (36.9 C)   There is no height or weight on file to calculate BMI.      Objective:   Physical Exam Vitals signs reviewed.  Constitutional:      Appearance: He is well-developed.  HENT:     Head: Normocephalic and atraumatic.  Eyes:     Conjunctiva/sclera: Conjunctivae normal.     Pupils: Pupils are equal, round, and reactive to light.  Neck:     Musculoskeletal: Normal range of motion and neck supple.  Cardiovascular:     Rate and Rhythm: Normal rate and regular rhythm.  Pulmonary:     Effort: Pulmonary effort is normal.  Abdominal:     Palpations: Abdomen is soft.  Musculoskeletal:       Arms:  Skin:    General: Skin is warm and dry.  Neurological:     Mental Status: He is alert and oriented to person, place, and time.     Cranial Nerves: No cranial nerve deficit.     Motor: No abnormal muscle tone.     Coordination: Coordination normal.     Deep Tendon Reflexes: Reflexes are normal and symmetric. Reflexes normal.  Psychiatric:        Behavior: Behavior normal.        Thought Content: Thought content normal.        Judgment: Judgment normal.    X-rays were done of the right shoulder, reported separately.  Severe DJD is present.       Assessment & Plan:   Encounter Diagnoses  Name Primary?  . Chronic right shoulder pain Yes  . ESRD on dialysis ([place])   . Type 2 diabetes mellitus with diabetic polyneuropathy, with long-term current use of insulin ([place])   . Essential hypertension   . Status post below knee amputation, right ([place])   . Status post below-knee amputation of left  lower extremity ([place])    I have told him and his wife his shoulder is just completely "worn out" from the arthritis and renal disease.  A total shoulder could be done but I do not think he is a good surgical candidate.  I Christon give injection today and see if that helps, it is worth a try.  PROCEDURE NOTE:  The patient request injection, verbal consent was obtained.  The right shoulder was prepped appropriately after time out was performed.   Sterile  technique was observed and injection of 1 cc of Depo-Medrol 40 mg with several cc's of plain xylocaine. Anesthesia was provided by ethyl chloride and a 20-gauge needle was used to inject the shoulder area. A posterior approach was used.  The injection was tolerated well.  A band aid dressing was applied.  The patient was advised to apply ice later today and tomorrow to the injection sight as needed.   Return in one month.  Call if any problem.  Precautions discussed.   Electronically Signed Sanjuana Kava, MD 7/16/202011:36 AM

## 2018-09-04 ENCOUNTER — Emergency Department (HOSPITAL_COMMUNITY): Payer: Medicare Other

## 2018-09-04 ENCOUNTER — Emergency Department (HOSPITAL_COMMUNITY)
Admission: EM | Admit: 2018-09-04 | Discharge: 2018-09-04 | Disposition: A | Discharge status: Home / Self Care | Payer: Medicare Other | Attending: Emergency Medicine | Admitting: Emergency Medicine

## 2018-09-04 ENCOUNTER — Other Ambulatory Visit: Payer: Self-pay

## 2018-09-04 ENCOUNTER — Encounter (HOSPITAL_COMMUNITY): Payer: Self-pay | Admitting: Student

## 2018-09-04 DIAGNOSIS — Z89511 Acquired absence of right leg below knee: Secondary | ICD-10-CM | POA: Insufficient documentation

## 2018-09-04 DIAGNOSIS — I2510 Atherosclerotic heart disease of native coronary artery without angina pectoris: Secondary | ICD-10-CM | POA: Insufficient documentation

## 2018-09-04 DIAGNOSIS — R079 Chest pain, unspecified: Secondary | ICD-10-CM | POA: Insufficient documentation

## 2018-09-04 DIAGNOSIS — I129 Hypertensive chronic kidney disease with stage 1 through stage 4 chronic kidney disease, or unspecified chronic kidney disease: Secondary | ICD-10-CM | POA: Diagnosis not present

## 2018-09-04 DIAGNOSIS — Z8673 Personal history of transient ischemic attack (TIA), and cerebral infarction without residual deficits: Secondary | ICD-10-CM | POA: Diagnosis not present

## 2018-09-04 DIAGNOSIS — Z87891 Personal history of nicotine dependence: Secondary | ICD-10-CM | POA: Insufficient documentation

## 2018-09-04 DIAGNOSIS — Z89512 Acquired absence of left leg below knee: Secondary | ICD-10-CM | POA: Diagnosis not present

## 2018-09-04 DIAGNOSIS — I120 Hypertensive chronic kidney disease with stage 5 chronic kidney disease or end stage renal disease: Secondary | ICD-10-CM | POA: Insufficient documentation

## 2018-09-04 DIAGNOSIS — E1122 Type 2 diabetes mellitus with diabetic chronic kidney disease: Secondary | ICD-10-CM | POA: Insufficient documentation

## 2018-09-04 DIAGNOSIS — Z7982 Long term (current) use of aspirin: Secondary | ICD-10-CM | POA: Insufficient documentation

## 2018-09-04 DIAGNOSIS — N186 End stage renal disease: Secondary | ICD-10-CM | POA: Insufficient documentation

## 2018-09-04 DIAGNOSIS — Z992 Dependence on renal dialysis: Secondary | ICD-10-CM | POA: Insufficient documentation

## 2018-09-04 DIAGNOSIS — Z79899 Other long term (current) drug therapy: Secondary | ICD-10-CM | POA: Diagnosis not present

## 2018-09-04 DIAGNOSIS — Z794 Long term (current) use of insulin: Secondary | ICD-10-CM | POA: Insufficient documentation

## 2018-09-04 LAB — LIPASE, BLOOD: Lipase: 29 U/L (ref 11–51)

## 2018-09-04 LAB — COMPREHENSIVE METABOLIC PANEL
ALT: 13 U/L (ref 0–44)
AST: 14 U/L — ABNORMAL LOW (ref 15–41)
Albumin: 2.7 g/dL — ABNORMAL LOW (ref 3.5–5.0)
Alkaline Phosphatase: 53 U/L (ref 38–126)
Anion gap: 10 (ref 5–15)
BUN: 42 mg/dL — ABNORMAL HIGH (ref 8–23)
CO2: 28 mmol/L (ref 22–32)
Calcium: 8.2 mg/dL — ABNORMAL LOW (ref 8.9–10.3)
Chloride: 98 mmol/L (ref 98–111)
Creatinine, Ser: 2.65 mg/dL — ABNORMAL HIGH (ref 0.61–1.24)
GFR calc Af Amer: 25 mL/min — ABNORMAL LOW (ref >60)
GFR calc non Af Amer: 22 mL/min — ABNORMAL LOW (ref >60)
Glucose, Bld: 168 mg/dL — ABNORMAL HIGH (ref 70–99)
Potassium: 2.9 mmol/L — ABNORMAL LOW (ref 3.5–5.1)
Sodium: 136 mmol/L (ref 135–145)
Total Bilirubin: 0.6 mg/dL (ref 0.3–1.2)
Total Protein: 6 g/dL — ABNORMAL LOW (ref 6.5–8.1)

## 2018-09-04 LAB — CBC
HCT: 37.4 % — ABNORMAL LOW (ref 39.0–52.0)
Hemoglobin: 12.2 g/dL — ABNORMAL LOW (ref 13.0–17.0)
MCH: 30.8 pg (ref 26.0–34.0)
MCHC: 32.6 g/dL (ref 30.0–36.0)
MCV: 94.4 fL (ref 80.0–100.0)
Platelets: 204 10*3/uL (ref 150–400)
RBC: 3.96 MIL/uL — ABNORMAL LOW (ref 4.22–5.81)
RDW: 14 % (ref 11.5–15.5)
WBC: 8.7 10*3/uL (ref 4.0–10.5)
nRBC: 0 % (ref 0.0–0.2)

## 2018-09-04 LAB — TROPONIN I (HIGH SENSITIVITY)
Troponin I (High Sensitivity): 22 ng/L — ABNORMAL HIGH (ref <18)
Troponin I (High Sensitivity): 24 ng/L — ABNORMAL HIGH (ref <18)

## 2018-09-04 MED ORDER — HYDRALAZINE HCL 20 MG/ML IJ SOLN
INTRAMUSCULAR | Status: AC
  Filled 2018-09-04: qty 1

## 2018-09-04 MED ORDER — HYDRALAZINE HCL 20 MG/ML IJ SOLN
5.0000 mg | Freq: Once | INTRAMUSCULAR | Status: AC
  Administered 2018-09-04: 5 mg via INTRAVENOUS
  Filled 2018-09-04: qty 1

## 2018-09-04 NOTE — Consult Note (Signed)
Cardiology Consultation:   Patient ID: Alan [name] MRN: [id number]; DOB: 30-May-1940  Admit date: 09/04/2018 Date of Consult: 09/04/2018  Primary Care Provider: Vidal Schwalbe, MD Primary Cardiologist: Carlyle Dolly, MD   Primary Electrophysiologist:  None     Patient Profile:   Alan [name] is a 78 y.o. male with a hx of CABG who is being seen today for the evaluation of chest pain  at the request of Alan [name] ER PA.  History of Present Illness:   Alan [name] 78 y.o. thin frail debilitated black male . Chronic dialysis from LUE fistual post bilateral BKA;s. CABG 1996 with LIMA to LAD, SBG OM Cath 2018 showed occluded SVG and atretic LIMA with diffuse OM/PDA dx Rx medically. Seen by [place] in cosult 01/24/18 and 06/01/18 for atypical chest pain and mildly chronically elevated troponin in setting of renal failure Gets pain during dialysis frequently. Had sharp pains in chest after a couple of hours dialysis today Pain radiated to abdomen and both arms. Got ASA and pain resolved spontaneously Currently pain free in ER but HTN 200/90 mmHg He indicates being compliant with meds CXR shows no CE and normal mediastinum with no signs of aortic pathology   Heart Pathway Score:     Past Medical History:  Diagnosis Date  . Arthritis   . Asthma   . Clotted renal dialysis arteriovenous graft ([place])   . Coronary artery disease    a. s/p CABG in 1996 with LIMA-LAD and SVG-OM1 b. 01/2017: cath showing occluded SVG-OM1 and atretic LIMA-LAD. Diffuse disease along 1st Mrg and PDA with medical therapy recommended.   . CVA (cerebral infarction)   . DDD (degenerative disc disease), cervical   . DDD (degenerative disc disease), lumbar   . Depression   . Diabetes mellitus   . ESRD (end stage renal disease) on dialysis ([place])   . Gangrene of toe ([place]) Feb. 2015   Right  great    . Gastric ulcer    EGD 2014  . Gout   . Hypercholesteremia   . Hypertension   . Sternal wound dehiscence    chronic  .  Stroke ([place])   . 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 Thrombosis of renal dialysis arteriovenous graft [facility])     Past Surgical History:  Procedure Laterality Date  . A/V FISTULAGRAM Left 05/28/2016   Procedure: A/V Fistulagram;  Surgeon: Katha Cabal, MD;  Location: [place] [facility];  Service: Cardiovascular;  Laterality: Left;  . A/V FISTULAGRAM Left 10/28/2016   Procedure: A/V Fistulagram;  Surgeon: Algernon Huxley, MD;  Location: [place] [facility];  Service: Cardiovascular;  Laterality: Left;  . A/V FISTULAGRAM Left 03/03/2017   Procedure: A/V FISTULAGRAM;  Surgeon: Algernon Huxley, MD;  Location: [place] [facility];  Service: Cardiovascular;  Laterality: Left;  . A/V SHUNT INTERVENTION N/A 05/28/2016   Procedure: A/V Shunt Intervention;  Surgeon: Katha Cabal, MD;  Location: [place] [facility];  Service: Cardiovascular;  Laterality: N/A;  . A/V SHUNT INTERVENTION N/A 03/03/2017   Procedure: A/V SHUNT INTERVENTION;  Surgeon: Algernon Huxley, MD;  Location: [place] [facility];  Service: Cardiovascular;  Laterality: N/A;  . AMPUTATION Right 04/10/2013   Procedure: Right Below Knee Amputation;  Surgeon: Newt Minion, MD;  Location: [place];  Service: Orthopedics;  Laterality: Right;  Right Below Knee Amputation  . APPENDECTOMY    . BELOW KNEE LEG AMPUTATION Bilateral   . CHOLECYSTECTOMY    . CORONARY ARTERY BYPASS GRAFT  [place]  . ESOPHAGOGASTRODUODENOSCOPY Left 05/24/2012   DTO:[id number] dilated baggy but otherwise a normal/ Small hiatal hernia. Gastric ulcer -S/P biopsy  . LEFT HEART CATH AND CORS/GRAFTS ANGIOGRAPHY N/A 02/12/2017   Procedure: LEFT HEART CATH AND CORS/GRAFTS ANGIOGRAPHY;  Surgeon: Lorretta Harp, MD;  Location: [place] [facility];  Service: Cardiovascular;  Laterality: N/A;  . PERIPHERAL VASCULAR CATHETERIZATION N/A 08/09/2014   Procedure: A/V Shuntogram/Fistulagram;  Surgeon: Katha Cabal, MD;  Location: [place] [facility];  Service:  Cardiovascular;  Laterality: N/A;  . PERIPHERAL VASCULAR CATHETERIZATION Left 08/09/2014   Procedure: A/V Shunt Intervention;  Surgeon: Katha Cabal, MD;  Location: [place] [facility];  Service: Cardiovascular;  Laterality: Left;     Home Medications:  Prior to Admission medications   Medication Sig Start Date End Date Taking? Authorizing Provider  acetaminophen (TYLENOL) 500 MG tablet Take 1,000 mg by mouth at bedtime. *May take daily if needed but takes mostly at bedtime   Yes [provider]  Amino Acid Infusion (PROSOL) 20 % SOLN Inject 1 vial into the vein once a week. 02/28/17  Yes [provider]  aspirin EC 81 MG tablet Take 81 mg by mouth daily.    Yes [provider]  atorvastatin (LIPITOR) 20 MG tablet Take 20 mg by mouth at bedtime.  01/02/18  Yes [provider]  B Complex-C-Folic Acid (NEPHRO-VITE PO) Take 1 tablet by mouth at bedtime.    Yes [provider]  budesonide-formoterol (SYMBICORT) 80-4.5 MCG/ACT inhaler Inhale 2 puffs into the lungs 2 (two) times daily as needed (for wheezing/shortness of breath.).   Yes [provider]  cinacalcet (SENSIPAR) 30 MG tablet Take 30 mg by mouth at bedtime.    Yes [provider]  Coenzyme Q10 (COQ10) 100 MG CAPS Take 100 mg by mouth at bedtime.   Yes [provider]  diphenhydrAMINE (BENADRYL) 25 mg capsule Take 25 mg by mouth at bedtime.    Yes [provider]  isosorbide mononitrate (IMDUR) 30 MG 24 hr tablet Take one half of 1 tablet twice a day Patient taking differently: Take 15 mg by mouth 2 (two) times daily. Take one half of 1 tablet twice a day 03/27/18  Yes Milton Ferguson, MD  LANTUS SOLOSTAR 100 UNIT/ML Solostar Pen Inject 9 Units into the skin at bedtime.  12/21/13  Yes [provider]  Liniments (SALONPAS PAIN RELIEF PATCH EX) Place 1 patch onto the skin daily as needed (for pain relief.).    Yes [provider]  lisinopril  (PRINIVIL,ZESTRIL) 5 MG tablet Take 5 mg by mouth daily.   Yes [provider]  nitroGLYCERIN (NITROSTAT) 0.4 MG SL tablet Place 1 tablet (0.4 mg total) under the tongue every 5 (five) minutes as needed for chest pain. 11/18/14  Yes Samuella Cota, MD  omega-3 acid ethyl esters (LOVAZA) 1 g capsule Take 1 g by mouth daily.   Yes [provider]  omeprazole (PRILOSEC) 20 MG capsule Take 1 capsule by mouth 2 (two) times a day.  03/20/17  Yes [provider]  sevelamer carbonate (RENVELA) 800 MG tablet Take 800 mg by mouth 3 (three) times daily with meals.  01/07/18  Yes [provider]  furosemide (LASIX) 20 MG tablet Take 20 mg by mouth daily.    [provider]    Inpatient Medications: Scheduled Meds:  Continuous Infusions:  PRN Meds:   Allergies:    Allergies  Allergen Reactions  .  Codeine Itching and Nausea And Vomiting  . [place] Venom [Bee Venom] Swelling    Social History:   Social History   Socioeconomic History  . Marital status: Married    Spouse name: Not on file  . Number of children: Not on file  . Years of education: Not on file  . Highest education level: Not on file  Occupational History  . Not on file  Social Needs  . Financial resource strain: Not on file  . Food insecurity    Worry: Not on file    Inability: Not on file  . Transportation needs    Medical: Not on file    Non-medical: Not on file  Tobacco Use  . Smoking status: Former Smoker    Quit date: 08/08/2004    Years since quitting: 14.0  . Smokeless tobacco: Never Used  Substance and Sexual Activity  . Alcohol use: No  . Drug use: No  . Sexual activity: Not Currently  Lifestyle  . Physical activity    Days per week: Not on file    Minutes per session: Not on file  . Stress: Not on file  Relationships  . Social Herbalist on phone: Not on file    Gets together: Not on file    Attends religious service: Not on file     Active member of club or organization: Not on file    Attends meetings of clubs or organizations: Not on file    Relationship status: Not on file  . Intimate partner violence    Fear of current or ex partner: Not on file    Emotionally abused: Not on file    Physically abused: Not on file    Forced sexual activity: Not on file  Other Topics Concern  . Not on file  Social History Narrative  . Not on file    Family History:    Family History  Problem Relation Age of Onset  . Cancer Mother   . Colon cancer Neg Hx      ROS:  Please see the history of present illness.   All other ROS reviewed and negative.     Physical Exam/Data:   Vitals:   09/04/18 1430 09/04/18 1500 09/04/18 1530 09/04/18 1600  BP: (!) 188/82  (!) 163/144 (!) 190/86  Pulse: 68 67 68 69  Resp: (!) 21  (!) 24 (!) 26  Temp:      TempSrc:      SpO2: 100% 100% 100% 100%  Weight:      Height:       No intake or output data in the 24 hours ending 09/04/18 1626 Last 3 Weights 09/04/2018 09/03/2018 07/17/2018  Weight (lbs) 129 lb (No Data) 199 lb 15.3 oz  Weight (kg) 58.514 kg (No Data) 90.7 kg     Body mass index is 17.5 kg/m.   Affect appropriate Chronically ill frail black male  HEENT: normal Neck supple with no adenopathy JVP normal no bruits no thyromegaly wide based scar on chest from CABG  Lungs clear with no wheezing and good diaphragmatic motion Heart:  S1/S2 no murmur, no rub, gallop or click PMI normal Abdomen: benighn, BS positve, no tenderness, no AAA no bruit.  No HSM or HJR Distal pulses intact with no bruits No edema Neuro non-focal Skin warm and dry Post bilateral AKA Fistula LUE with good thrill    EKG:  The EKG was personally reviewed and demonstrates:  NSR no acute  ST changes  Telemetry:  Telemetry was personally reviewed and demonstrates:  NSR   Relevant CV Studies:  Cath 02/12/17  Laboratory Data:  High Sensitivity Troponin:   Recent Labs  Lab 09/04/18 1242 09/04/18  1502  TROPONINIHS 22.00* 24.00*     Cardiac EnzymesNo results for input(s): TROPONINI in the last 168 hours. No results for input(s): TROPIPOC in the last 168 hours.  Chemistry Recent Labs  Lab 09/04/18 1314  NA 136  K 2.9*  CL 98  CO2 28  GLUCOSE 168*  BUN 42*  CREATININE 2.65*  CALCIUM 8.2*  GFRNONAA 22*  GFRAA 25*  ANIONGAP 10    Recent Labs  Lab 09/04/18 1314  PROT 6.0*  ALBUMIN 2.7*  AST 14*  ALT 13  ALKPHOS 53  BILITOT 0.6   Hematology Recent Labs  Lab 09/04/18 1242  WBC 8.7  RBC 3.96*  HGB 12.2*  HCT 37.4*  MCV 94.4  MCH 30.8  MCHC 32.6  RDW 14.0  PLT 204   BNPNo results for input(s): BNP, PROBNP in the last 168 hours.  DDimer No results for input(s): DDIMER in the last 168 hours.   Radiology/Studies:  Dg Chest 2 View  Result Date: 09/04/2018 CLINICAL DATA:  Left-sided chest pain that began during dialysis treatment today. EXAM: CHEST - 2 VIEW COMPARISON:  07/17/2018 FINDINGS: There are changes from prior CABG surgery. Cardiac silhouette is normal in size. No mediastinal or hilar masses or evidence of adenopathy. Mild linear scarring, left upper lobe, stable. Lungs otherwise clear. No pleural effusion.  No pneumothorax. Skeletal structures are demineralized but grossly intact. IMPRESSION: No acute cardiopulmonary disease. Electronically Signed   By: Lajean Manes M.D.   On: 09/04/2018 13:37   Dg Shoulder Right  Result Date: 09/03/2018 Clinical:  Right shoulder pain, no trauma.  On dialysis. X-rays were done of the right shoulder, two views. There is severe degenerative changes of the right shoulder with deformity of glenoid and significant sclerosis present.  Humeral head is very high riding suggesting complete rotator cuff tear/problems.  There is severe DJD of the [facility] joint with old fracture distal clavicle.  Acromion has sclerosis and bone on bone from the humeral head apposition.  Humeral head has sclerosis changes but no fracture is noted.  Bone  quality is fair. Impression:  Severe degenerative changes of right shoulder. Electronically Signed Sanjuana Kava, MD 7/16/202011:00 AM    Assessment and Plan:   1. Chest Pain: chronic resolved no acute ECG changes in setting of renal failure and chronic pain don't think HS troponin 24/22 significant. Continue medical Rx [place] arrange outpatient f/u with Dr Harl Bowie  2. HTN:  Would Rx with iv/po hydralazine and decrease prior to dc/  3. CRF:  Probably ok to wait for dialysis on Monday not volume overloaded   [facility] [place]ie sign off.   Medication Recommendations:  Hydralazine BP Other recommendations (labs, testing, etc):  None  Follow up as an outpatient:  Dr Harl Bowie Aric arrange   For questions or updates, please contact [facility] Please consult [email or web address] for contact info under     Signed, Jenkins Rouge, MD  09/04/2018 4:26 PM

## 2018-09-04 NOTE — Discharge Instructions (Addendum)
You were seen in the emergency department today for chest pain. Your work-up in the emergency department has been overall reassuring. Your labs have been fairly normal and or similar to previous blood work you have had done. Your EKG looked similar to prior EKGs you have had performed. Your chest x-ray was normal.   Your blood pressure was high in the emergency department, we recommend that you follow-up closely with primary care for recheck of this.  Be sure to take your blood pressure medication as prescribed.  We would like you to follow up closely with your primary care provider and/or the cardiologist provided in your discharge instructions within 1-3 days. Return to the ER immediately should you experience any new or worsening symptoms including but not limited to return of pain, worsened pain, vomiting, shortness of breath, dizziness, lightheadedness, passing out, or any other concerns that you may have.

## 2018-09-04 NOTE — ED Triage Notes (Signed)
Patient was in the middle of dialysis treatment and started to have left sided chest pain, given 4 baby aspirin by EMS.

## 2018-09-04 NOTE — ED Provider Notes (Signed)
[facility] EMERGENCY DEPARTMENT Provider Note   CSN: [id number] Arrival date & time: 09/04/18  1234     History   Chief Complaint Chief Complaint  Patient presents with  . Chest Pain    HPI Alan [name] is a 78 y.o. male with a hx of prior stroke, CAD s/p CABG in 1996 (most recent cardiac cath 01/2017), T2DM, ESRD on dialysis MWF, HTN, hypercholesterolemia, gastric ulcers, & s/p bilateral BKA who presents to the ED via EMS with complaints of chest pain that began approximately 1 hour PTA which is now fairly resolved. Patient states he was sitting at dialysis, about mid-way through his session when he developed anterior bilateral discomfort described as a heaviness that radiated into the R am & into the upper abdomen. He states pain was moderate in severity, lasted about 1 hour, & then spontaneously resolved. No specific alleviating/aggravating factors. He received 324 mg of ASA en route by EMS. He reports hx of similar chest pain @ dialysis before. Denies nausea, vomiting, diaphoresis, lightheadedness, dizziness, syncope, dyspnea, leg pain/swelling, melena, hematochezia, diarrhea, or constipation. He was most recently fully dialyzed Wednesday of this week & did have partial dialysis today.      HPI  Past Medical History:  Diagnosis Date  . Arthritis   . Asthma   . Clotted renal dialysis arteriovenous graft ([place])   . Coronary artery disease    a. s/p CABG in 1996 with LIMA-LAD and SVG-OM1 b. 01/2017: cath showing occluded SVG-OM1 and atretic LIMA-LAD. Diffuse disease along 1st Mrg and PDA with medical therapy recommended.   . CVA (cerebral infarction)   . DDD (degenerative disc disease), cervical   . DDD (degenerative disc disease), lumbar   . Depression   . Diabetes mellitus   . ESRD (end stage renal disease) on dialysis ([place])   . Gangrene of toe ([place]) Feb. 2015   Right  great    . Gastric ulcer    EGD 2014  . Gout   . Hypercholesteremia   . Hypertension   . Sternal wound  dehiscence    chronic  . Stroke ([place])   . 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 Thrombosis of renal dialysis arteriovenous graft [facility])     Patient Active Problem List   Diagnosis Date Noted  . Angina at rest [facility]) 01/23/2018  . Hemodialysis-associated hypotension   . Left sided numbness 02/10/2017  . Hypertensive urgency 02/10/2017  . Hypokalemia 02/10/2017  . Chest pain 06/15/2016  . Renal dialysis device, implant, or graft complication 86/76/7209  . Septic shock ([place]) 08/28/2015  . 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 Lower urinary tract infectious disease   . Essential hypertension 08/13/2015  . Nausea and vomiting 08/13/2015  . ESRD (end stage renal disease) ([place]) 04/12/2015  . Gastroenteritis 04/12/2015  . S/P bilateral BKA (below knee amputation) ([place]) 04/12/2015  . HLD (hyperlipidemia) 04/12/2015  . Acute chest pain 11/16/2014  . Atherosclerosis of native arteries of the extremities with gangrene ([place]) 04/20/2013  . Sepsis ([place]) 04/13/2013  . Gram-negative bacteremia 04/12/2013  . ESRD on dialysis ([place]) 04/09/2013  . 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 Protein-calorie malnutrition, severe ([place]) 04/09/2013  . Gangrene of toe ([place]) 04/08/2013  . Toe gangrene ([place]) 04/08/2013  . Nausea with vomiting 05/22/2012  . S/P BKA (below knee amputation) unilateral, left 05/20/2012  . Musculoskeletal chest pain 05/20/2012  . Acute renal failure ([place]) 05/19/2012  . CAD (coronary artery disease) with CABG 05/19/2012  . DM type 2 causing vascular disease ([place]) 05/19/2012  .  Hyperkalemia 05/19/2012    Past Surgical History:  Procedure Laterality Date  . A/V FISTULAGRAM Left 05/28/2016   Procedure: A/V Fistulagram;  Surgeon: Katha Cabal, MD;  Location: [place] [facility];  Service: Cardiovascular;  Laterality: Left;  . A/V FISTULAGRAM Left 10/28/2016   Procedure: A/V Fistulagram;  Surgeon: Algernon Huxley, MD;  Location: [place] [facility];  Service:  Cardiovascular;  Laterality: Left;  . A/V FISTULAGRAM Left 03/03/2017   Procedure: A/V FISTULAGRAM;  Surgeon: Algernon Huxley, MD;  Location: [place] [facility];  Service: Cardiovascular;  Laterality: Left;  . A/V SHUNT INTERVENTION N/A 05/28/2016   Procedure: A/V Shunt Intervention;  Surgeon: Katha Cabal, MD;  Location: [place] [facility];  Service: Cardiovascular;  Laterality: N/A;  . A/V SHUNT INTERVENTION N/A 03/03/2017   Procedure: A/V SHUNT INTERVENTION;  Surgeon: Algernon Huxley, MD;  Location: [place] [facility];  Service: Cardiovascular;  Laterality: N/A;  . AMPUTATION Right 04/10/2013   Procedure: Right Below Knee Amputation;  Surgeon: Newt Minion, MD;  Location: [facility];  Service: Orthopedics;  Laterality: Right;  Right Below Knee Amputation  . APPENDECTOMY    . BELOW KNEE LEG AMPUTATION Bilateral   . CHOLECYSTECTOMY    . CORONARY ARTERY BYPASS GRAFT  1996   [place]  . ESOPHAGOGASTRODUODENOSCOPY Left 05/24/2012   JHE:RDEYCXKG dilated baggy but otherwise a normal/ Small hiatal hernia. Gastric ulcer -S/P biopsy  . LEFT HEART CATH AND CORS/GRAFTS ANGIOGRAPHY N/A 02/12/2017   Procedure: LEFT HEART CATH AND CORS/GRAFTS ANGIOGRAPHY;  Surgeon: Lorretta Harp, MD;  Location: [place];  Service: Cardiovascular;  Laterality: N/A;  . PERIPHERAL VASCULAR CATHETERIZATION N/A 08/09/2014   Procedure: A/V Shuntogram/Fistulagram;  Surgeon: Katha Cabal, MD;  Location: [place] [facility];  Service: Cardiovascular;  Laterality: N/A;  . PERIPHERAL VASCULAR CATHETERIZATION Left 08/09/2014   Procedure: A/V Shunt Intervention;  Surgeon: Katha Cabal, MD;  Location: [place] [facility];  Service: Cardiovascular;  Laterality: Left;        Home Medications    Prior to Admission medications   Medication Sig Start Date End Date Taking? Authorizing Provider  acetaminophen (TYLENOL) 500 MG tablet Take 1,000 mg by mouth at bedtime. *May take daily if needed but takes mostly at bedtime     [provider]  Amino Acid Infusion (PROSOL) 20 % SOLN Inject 1 vial into the vein once a week. 02/28/17   [provider]  aspirin EC 81 MG tablet Take 81 mg by mouth daily.     [provider]  atorvastatin (LIPITOR) 20 MG tablet Take 20 mg by mouth at bedtime.  01/02/18   [provider]  B Complex-C-Folic Acid (NEPHRO-VITE PO) Take 1 tablet by mouth at bedtime.     [provider]  budesonide-formoterol (SYMBICORT) 80-4.5 MCG/ACT inhaler Inhale 2 puffs into the lungs 2 (two) times daily as needed (for wheezing/shortness of breath.).    [provider]  cinacalcet (SENSIPAR) 30 MG tablet Take 30 mg by mouth at bedtime.     [provider]  Coenzyme Q10 (COQ10) 100 MG CAPS Take 100 mg by mouth at bedtime.    [provider]  diphenhydrAMINE (BENADRYL) 25 mg capsule Take 25 mg by mouth at bedtime.     [provider]  furosemide (LASIX) 20 MG tablet Take 20 mg by mouth daily.    [provider]  isosorbide mononitrate (IMDUR) 30 MG 24 hr tablet Take one half  of 1 tablet twice a day Patient taking differently: Take 15 mg by mouth 2 (two) times daily. Take one half of 1 tablet twice a day 03/27/18   Milton Ferguson, MD  LANTUS SOLOSTAR 100 UNIT/ML Solostar Pen Inject 9 Units into the skin at bedtime.  12/21/13   [provider]  Liniments (SALONPAS PAIN RELIEF PATCH EX) Place 1 patch onto the skin daily as needed (for pain relief.).     [provider]  lisinopril (PRINIVIL,ZESTRIL) 5 MG tablet Take 5 mg by mouth daily.    [provider]  nitroGLYCERIN (NITROSTAT) 0.4 MG SL tablet Place 1 tablet (0.4 mg total) under the tongue every 5 (five) minutes as needed for chest pain. 11/18/14   Samuella Cota, MD  omega-3 acid ethyl esters (LOVAZA) 1 g capsule Take 1 g by mouth daily.    [provider]  omeprazole (PRILOSEC) 20 MG capsule Take 1 capsule by mouth 2 (two) times a  day.  03/20/17   [provider]  sevelamer carbonate (RENVELA) 800 MG tablet Take 800 mg by mouth 3 (three) times daily with meals.  01/07/18   [provider]    Family History Family History  Problem Relation Age of Onset  . Cancer Mother   . Colon cancer Neg Hx     Social History Social History   Tobacco Use  . Smoking status: Former Smoker    Quit date: 08/08/2004    Years since quitting: 14.0  . Smokeless tobacco: Never Used  Substance Use Topics  . Alcohol use: No  . Drug use: No     Allergies   Codeine and Yellow jacket venom [bee venom]   Review of Systems Review of Systems  Constitutional: Negative for chills, diaphoresis and fever.  Respiratory: Negative for cough and shortness of breath.   Cardiovascular: Positive for chest pain (resolved @ present). Negative for palpitations and leg swelling.  Gastrointestinal: Positive for abdominal pain (resolved @ present). Negative for blood in stool, constipation, diarrhea, nausea and vomiting.  Genitourinary: Negative for dysuria.  Neurological: Negative for dizziness, syncope, weakness and numbness.  All other systems reviewed and are negative.   Physical Exam Updated Vital Signs BP (!) 177/83 (BP Location: Right Arm)   Pulse 71   Temp 97.6 F (36.4 C) (Oral)   Resp 13   Ht 6' (1.829 m)   Wt 58.5 kg   SpO2 100%   BMI 17.50 kg/m   Physical Exam Vitals signs and nursing note reviewed.  Constitutional:      General: He is not in acute distress.    Appearance: He is well-developed. He is not toxic-appearing.  HENT:     Head: Normocephalic and atraumatic.  Eyes:     General:        Right eye: No discharge.        Left eye: No discharge.     Conjunctiva/sclera: Conjunctivae normal.  Neck:     Musculoskeletal: Neck supple.  Cardiovascular:     Rate and Rhythm: Normal rate and regular rhythm.     Pulses:          Radial pulses are 2+ on the right side and 2+ on the left side.   Pulmonary:     Effort: Pulmonary effort is normal. No respiratory distress.     Breath sounds: Normal breath sounds. No wheezing, rhonchi or rales.  Chest:     Chest wall: Tenderness (mild anterior chest wall w/o overlying erythema/warmth/rashes or  palpable crepitus) present.     Comments: Sternotomy scar well healed.  Abdominal:     General: There is no distension.     Palpations: Abdomen is soft.     Tenderness: There is abdominal tenderness (mild to LUQ). There is no guarding or rebound.  Musculoskeletal:     Comments: LUE fistula present w/ palpable thrill.  BLE BKA. LEs are without edema, erythema, or tenderness to palpation.   Skin:    General: Skin is warm and dry.     Findings: No rash.  Neurological:     Mental Status: He is alert.     Comments: Clear speech.   Psychiatric:        Behavior: Behavior normal.    ED Treatments / Results  Labs (all labs ordered are listed, but only abnormal results are displayed) Labs Reviewed  CBC - Abnormal; Notable for the following components:      Result Value   RBC 3.96 (*)    Hemoglobin 12.2 (*)    HCT 37.4 (*)    All other components within normal limits  COMPREHENSIVE METABOLIC PANEL  LIPASE, BLOOD  TROPONIN I (HIGH SENSITIVITY)    EKG EKG Interpretation  Date/Time:  Friday September 04 2018 12:43:20 EDT Ventricular Rate:  71 PR Interval:    QRS Duration: 92 QT Interval:  421 QTC Calculation: 458 R Axis:   7 Text Interpretation:  Sinus rhythm Abnormal R-wave progression, early transition unchanged from prior 07/17/18 Confirmed by Noemi Chapel [phone]) on 09/04/2018 12:53:52 PM   Radiology Dg Chest 2 View  Result Date: 09/04/2018 CLINICAL DATA:  Left-sided chest pain that began during dialysis treatment today. EXAM: CHEST - 2 VIEW COMPARISON:  07/17/2018 FINDINGS: There are changes from prior CABG surgery. Cardiac silhouette is normal in size. No mediastinal or hilar masses or evidence of adenopathy. Mild linear scarring,  left upper lobe, stable. Lungs otherwise clear. No pleural effusion.  No pneumothorax. Skeletal structures are demineralized but grossly intact. IMPRESSION: No acute cardiopulmonary disease. Electronically Signed   By: Lajean Manes M.D.   On: 09/04/2018 13:37   Dg Shoulder Right  Result Date: 09/03/2018 Clinical:  Right shoulder pain, no trauma.  On dialysis. X-rays were done of the right shoulder, two views. There is severe degenerative changes of the right shoulder with deformity of glenoid and significant sclerosis present.  Humeral head is very high riding suggesting complete rotator cuff tear/problems.  There is severe DJD of the [facility] joint with old fracture distal clavicle.  Acromion has sclerosis and bone on bone from the humeral head apposition.  Humeral head has sclerosis changes but no fracture is noted.  Bone quality is fair. Impression:  Severe degenerative changes of right shoulder. Electronically Signed Sanjuana Kava, MD 7/16/202011:00 AM    Procedures Procedures (including critical care time)  Medications Ordered in ED Medications - No data to display  Prior studies reviewed:   02/12/2017  Mid RCA lesion is 50% stenosed.  2nd RPLB lesion is 90% stenosed.  Ost 1st Mrg to 1st Mrg lesion is 70% stenosed.  Origin lesion is 100% stenosed.  Origin to Prox Graft lesion is 100% stenosed.  The left ventricular systolic function is normal.  LV end diastolic pressure is normal.  The left ventricular ejection fraction is 55-65% by visual estimate.  02/11/2017 Echo Study Conclusions  - Left ventricle: The cavity size was normal. There was mild   concentric hypertrophy. Systolic function was normal. The   estimated ejection fraction was in  the range of 55% to 60%. There   is akinesis of the basalinferoseptal myocardium. Features are   consistent with a pseudonormal left ventricular filling pattern,   with concomitant abnormal relaxation and increased filling   pressure  (grade 2 diastolic dysfunction). - Aortic valve: Trileaflet; moderately thickened, moderately   calcified leaflets. - Left atrium: Volume/bsa, S: 31.9 ml/m^2.  Initial Impression / Assessment and Plan / ED Course  I have reviewed the triage vital signs and the nursing notes.  Pertinent labs & imaging results that were available during my care of the patient were reviewed by me and considered in my medical decision making (see chart for details).   Patient presents to the ED w/ complaints of chest pain that began about 1 hour PTA during dialysis that is resolved @ present. He is nontoxic appearing, in no apparent distress, vitals WNL with the exception of elevated BP similar to prior visits, low suspicion for HTN emergency. Exam w/ some mild anterior chest wall & LUQ abdominal tenderness, no peritoneal signs.   Prior records have been reviewed: s/p CABG > 20 years ago, most recent cardiac cath 01/2017 w/ CAD present, last chest pain admission 01/2018--cardiology consultation note reviewed from admission: patient not felt to be candidate for repeat cath given anatomy in 2018 & non ambulatory status unless he has large MI with troponins & ongoing chest pain. He has had 4 prior ED visits in 2020 for chest pain somewhat similar to today.    CBC: Anemia improved from prior, no leukocytosis.  CMP: Baseline renal function w/ known ESRD. Hypokalemia- s/p dialysis prior to arrival- Sharron not correct given dialysis patient. Hyperglycemia w/o acidosis or anion gap elevation. LFTs without significant abnormality.  Lipase: WNL High sensitivity troponin: 22.00---> 24.00 EKG: No significant change from prior.  CXR: No acute cardiopulmonary disease.   Low risk wells- doubt PE No widened mediastinum on CXR, symmetric pulses, pain free in the ED- doubt dissection.  Repeat abdominal exam w/o tenderness, no peritoneal signs.   Delta trop w/ mild increase from 22.00-->24.00, EKG without significant change from  prior- no STEMI. Patient's cardiac history thoroughly reviewed. Given his elevated troponin > 19 ng/L Erasmo discuss w/ cardiology.   Findings and plan of care discussed with supervising physician Dr. Sabra Heck who has evaluated patient & is agreement.   16:25: CONSULT: Discussed w/ cardiologist Dr. Johnsie Cancel- recommends hydralazine in the ED for BP improvement & discharge home after hour of observation & BP monitoring.   Per review of monitor in patient's room- it appears patient's BP was in the 169I systolic around time of cardiology evaluation--> Bp improved to 503U systolic- appears consistent w/ his prior vitals, he is requesting discharge which feels appropriate @ this time per discussion w/ Dr. Thurnell Garbe supervising physician  @ change of shift. I discussed results, treatment plan, need for follow-up, and return precautions with the patient. Provided opportunity for questions, patient confirmed understanding and is in agreement with plan.    Final Clinical Impressions(s) / ED Diagnoses   Final diagnoses:  Chest pain, unspecified type    ED Discharge Orders    None       Amaryllis Dyke, PA-C 09/04/18 1753    Noemi Chapel, MD 09/06/18 1538

## 2018-09-23 ENCOUNTER — Telehealth: Payer: Self-pay | Admitting: Cardiology

## 2018-09-23 NOTE — Telephone Encounter (Signed)
YOUR CARDIOLOGY TEAM HAS ARRANGED FOR AN E-VISIT FOR YOUR APPOINTMENT - PLEASE REVIEW IMPORTANT INFORMATION BELOW SEVERAL DAYS PRIOR TO YOUR APPOINTMENT  Due to the recent COVID-19 pandemic, we are transitioning in-person office visits to tele-medicine visits in an effort to decrease unnecessary exposure to our patients and staff. Medicare and most insurances are covering these visits without a copay needed. We also encourage you to sign up for MyChart if you have not already done so. You Garhett need a smartphone if possible. For patients that do not have this, we can still complete the visit using a regular telephone but do prefer a smartphone to enable video when possible. You may have a close family member that lives with you that can help. If possible, we also ask that you have a blood pressure cuff and scale at home to measure your blood pressure, heart rate and weight prior to your scheduled appointment. Patients with clinical needs that need an in-person evaluation and testing [place] still be able to come to the office if absolutely necessary. If you have any questions, feel free to call our office.    IF YOU HAVE A SMARTPHONE, PLEASE DOWNLOAD THE WEBEX APP TO YOUR SMARTPHONE  - If Apple, go to App Store and type in WebEx in the search bar. Download [employer] Webex Meetings, the blue/green circle. The app is free but as with any other app download, your phone may require you to verify saved payment information or Apple password. You do NOT have to create a WebEx account.  - If Android, go to Google Play Store and type in WebEx in the search bar. Download [employer] Webex Meetings, the blue/green circle. The app is free but as with any other app download, your phone may require you to verify saved payment information or Android password. You do NOT have to create a WebEx account.  It is very helpful to have this downloaded before your visit.    2-3 DAYS BEFORE YOUR APPOINTMENT  You Kermit receive a  telephone call from one of our [facility] team members - your caller ID may say "Unknown caller." If this is a video visit, we Alphonzo confirm that you have been able to download the WebEx app. We Arish remind you check your blood pressure, heart rate and weight prior to your scheduled appointment. If you have an Apple Watch or Kardia, please upload any pertinent ECG strips the day before or morning of your appointment to MyChart. Our staff Yani also make sure you have reviewed the consent and agree to move forward with your scheduled tele-health visit.     THE DAY OF YOUR APPOINTMENT  Approximately 15 minutes prior to your scheduled appointment, you Kien receive a telephone call from one of [facility] team - your caller ID may say "Unknown caller."  Our staff [place] confirm medications, vital signs for the day and any symptoms you may be experiencing. Please have this information available prior to the time of visit start. It may also be helpful for you to have a pad of paper and pen handy for any instructions given during your visit. They Max[place] also walk you through joining the WebEx smartphone meeting if this is a video visit.    CONSENT FOR TELE-HEALTH VISIT - PLEASE REVIEW  I hereby voluntarily request, consent and authorize [facility] and its employed or contracted physicians, physician assistants, nurse practitioners or other licensed health care professionals (the Practitioner), to provide me with telemedicine health care services (the "Services") as   deemed necessary by the treating Practitioner. I acknowledge and consent to receive the Services by the Practitioner via telemedicine. I understand that the telemedicine visit [place] involve communicating with the Practitioner through live audiovisual communication technology and the disclosure of certain medical information by electronic transmission. I acknowledge that I have been given the opportunity to request an in-person assessment or other available  alternative prior to the telemedicine visit and am voluntarily participating in the telemedicine visit.  I understand that I have the right to withhold or withdraw my consent to the use of telemedicine in the course of my care at any time, without affecting my right to future care or treatment, and that the Practitioner or I may terminate the telemedicine visit at any time. I understand that I have the right to inspect all information obtained and/or recorded in the course of the telemedicine visit and may receive copies of available information for a reasonable fee.  I understand that some of the potential risks of receiving the Services via telemedicine include:  . Delay or interruption in medical evaluation due to technological equipment failure or disruption; . Information transmitted may not be sufficient (e.g. poor resolution of images) to allow for appropriate medical decision making by the Practitioner; and/or  . In rare instances, security protocols could fail, causing a breach of personal health information.  Furthermore, I acknowledge that it is my responsibility to provide information about my medical history, conditions and care that is complete and accurate to the best of my ability. I acknowledge that Practitioner's advice, recommendations, and/or decision may be based on factors not within their control, such as incomplete or inaccurate data provided by me or distortions of diagnostic images or specimens that may result from electronic transmissions. I understand that the practice of medicine is not an exact science and that Practitioner makes no warranties or guarantees regarding treatment outcomes. I acknowledge that I [place] receive a copy of this consent concurrently upon execution via email to the email address I last provided but may also request a printed copy by calling the office of [facility].    I understand that my insurance Jaykub be billed for this visit.   I have read or had  this consent read to me. . I understand the contents of this consent, which adequately explains the benefits and risks of the Services being provided via telemedicine.  . I have been provided ample opportunity to ask questions regarding this consent and the Services and have had my questions answered to my satisfaction. . I give my informed consent for the services to be provided through the use of telemedicine in my medical care  By participating in this telemedicine visit I agree to the above.  

## 2018-09-24 ENCOUNTER — Telehealth: Payer: Medicare Other | Admitting: Cardiology

## 2018-09-24 ENCOUNTER — Other Ambulatory Visit: Payer: Self-pay

## 2018-09-24 NOTE — Progress Notes (Unsigned)
{Choose 1 Note Type (Telehealth Visit or Telephone Visit):[phone]}   Date:  09/24/2018   ID:  Alan [name], DOB [birth year], MRN [id number]  {Patient [phone]::"Home"} {Provider [phone]::"Home"}  PCP:  Vidal Schwalbe, MD  Cardiologist:  Carlyle Dolly, MD *** Electrophysiologist:  None   Evaluation Performed:  {Choose Visit [phone]::"Follow-Up Visit"}  Chief Complaint:  ***  History of Present Illness:    Alan [name] is a 78 y.o. male with ***  1. CAD - prior CABG 1994 at [facility] 2 vessel: LIMA-LAD, SVG-OM1 and stenting - history of chronic chest pains, particularly during HD  - Admit 01/2017 with angina. Cath showed  Mid RCA 50%, RPLB second Hildegarde Dunaway 90%, OM1 70%, LCX 100%. Occluded LIMA-LAD, occluded SV-OM1. Recs for medical therapy of his OM disease 01/2017 echo LVEF 55-60%, grade II diastolic dysfunction  - admit recently 08/2018 with chest pains. No acute EKG changes, no significant trop elevation.    2. HTN   3. PAD   4. ESRD      The patient {does/does not:200015} have symptoms concerning for COVID-19 infection (fever, chills, cough, or new shortness of breath).    Past Medical History:  Diagnosis Date  . Arthritis   . Asthma   . Clotted renal dialysis arteriovenous graft ([place])   . Coronary artery disease    a. s/p CABG in 1996 with LIMA-LAD and SVG-OM1 b. 01/2017: cath showing occluded SVG-OM1 and atretic LIMA-LAD. Diffuse disease along 1st Mrg and PDA with medical therapy recommended.   . CVA (cerebral infarction)   . DDD (degenerative disc disease), cervical   . DDD (degenerative disc disease), lumbar   . Depression   . Diabetes mellitus   . ESRD (end stage renal disease) on dialysis ([place])   . Gangrene of toe ([place]) Feb. 2015   Right  great    . Gastric ulcer    EGD 2014  . Gout   . Hypercholesteremia   . Hypertension   . Sternal wound dehiscence    chronic  . Stroke ([place])   . 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   . Thrombosis of renal dialysis arteriovenous graft [facility])    Past Surgical History:  Procedure Laterality Date  . A/V FISTULAGRAM Left 05/28/2016   Procedure: A/V Fistulagram;  Surgeon: Katha Cabal, MD;  Location: [place] [facility];  Service: Cardiovascular;  Laterality: Left;  . A/V FISTULAGRAM Left 10/28/2016   Procedure: A/V Fistulagram;  Surgeon: Algernon Huxley, MD;  Location: [place] [facility];  Service: Cardiovascular;  Laterality: Left;  . A/V FISTULAGRAM Left 03/03/2017   Procedure: A/V FISTULAGRAM;  Surgeon: Algernon Huxley, MD;  Location: [place] [facility];  Service: Cardiovascular;  Laterality: Left;  . A/V SHUNT INTERVENTION N/A 05/28/2016   Procedure: A/V Shunt Intervention;  Surgeon: Katha Cabal, MD;  Location: [place] [facility];  Service: Cardiovascular;  Laterality: N/A;  . A/V SHUNT INTERVENTION N/A 03/03/2017   Procedure: A/V SHUNT INTERVENTION;  Surgeon: Algernon Huxley, MD;  Location: [place] [facility];  Service: Cardiovascular;  Laterality: N/A;  . AMPUTATION Right 04/10/2013   Procedure: Right Below Knee Amputation;  Surgeon: Newt Minion, MD;  Location: [place];  Service: Orthopedics;  Laterality: Right;  Right Below Knee Amputation  . APPENDECTOMY    . BELOW KNEE LEG AMPUTATION Bilateral   . CHOLECYSTECTOMY    . CORONARY ARTERY BYPASS GRAFT  1996   [place]  . ESOPHAGOGASTRODUODENOSCOPY Left 05/24/2012   [id number] dilated baggy but otherwise a normal/ Small hiatal  hernia. Gastric ulcer -S/P biopsy  . LEFT HEART CATH AND CORS/GRAFTS ANGIOGRAPHY N/A 02/12/2017   Procedure: LEFT HEART CATH AND CORS/GRAFTS ANGIOGRAPHY;  Surgeon: Lorretta Harp, MD;  Location: [place] [facility];  Service: Cardiovascular;  Laterality: N/A;  . PERIPHERAL VASCULAR CATHETERIZATION N/A 08/09/2014   Procedure: A/V Shuntogram/Fistulagram;  Surgeon: Katha Cabal, MD;  Location: [place] [facility];  Service: Cardiovascular;  Laterality: N/A;  . PERIPHERAL VASCULAR  CATHETERIZATION Left 08/09/2014   Procedure: A/V Shunt Intervention;  Surgeon: Katha Cabal, MD;  Location: [place] [facility];  Service: Cardiovascular;  Laterality: Left;     No outpatient medications have been marked as taking for the 09/24/18 encounter (Appointment) with [name], MD.     Allergies:   Codeine and Yellow jacket venom [bee venom]   Social History   Tobacco Use  . Smoking status: Former Smoker    Quit date: 08/08/2004    Years since quitting: 14.1  . Smokeless tobacco: Never Used  Substance Use Topics  . Alcohol use: No  . Drug use: No     Family Hx: The patient's family history includes Cancer in his mother. There is no history of Colon cancer.  ROS:   Please see the history of present illness.    *** All other systems reviewed and are negative.   Prior CV studies:   The following studies were reviewed today:  ***  Labs/Other Tests and Data Reviewed:    EKG:  {EKG/Telemetry Strips Reviewed:[phone]}  Recent Labs: 01/07/2018: Magnesium 1.8 06/01/2018: B Natriuretic Peptide 215.0 09/04/2018: ALT 13; BUN 42; Creatinine, Ser 2.65; Hemoglobin 12.2; Platelets 204; Potassium 2.9; Sodium 136   Recent Lipid Panel Lab Results  Component Value Date/Time   CHOL 224 (H) 02/11/2017 08:09 AM   TRIG 86 02/11/2017 08:09 AM   HDL 39 (L) 02/11/2017 08:09 AM   CHOLHDL 5.7 02/11/2017 08:09 AM   LDLCALC 168 (H) 02/11/2017 08:09 AM    Wt Readings from Last 3 Encounters:  09/04/18 129 lb (58.5 kg)  07/17/18 199 lb 15.3 oz (90.7 kg)  06/19/18 200 lb (90.7 kg)     Objective:    Vital Signs:  There were no vitals taken for this visit.   {HeartCare Virtual Exam (Optional):[phone]::"VITAL SIGNS:  reviewed"}  ASSESSMENT & PLAN:    1. ***  COVID-19 Education: The signs and symptoms of COVID-19 were discussed with the patient and how to seek care for testing (follow up with PCP or arrange E-visit).  ***The importance of social distancing was  discussed today.  Time:   Today, I have spent *** minutes with the patient with telehealth technology discussing the above problems.     Medication Adjustments/Labs and Tests Ordered: Current medicines are reviewed at length with the patient today.  Concerns regarding medicines are outlined above.   Tests Ordered: No orders of the defined types were placed in this encounter.   Medication Changes: No orders of the defined types were placed in this encounter.   Follow Up:  {F/U Format:[phone]} {follow up:15908}  Signed, Carlyle Dolly, MD  09/24/2018 12:54 PM    [facility]

## 2018-09-29 ENCOUNTER — Other Ambulatory Visit: Payer: Self-pay

## 2018-09-29 ENCOUNTER — Telehealth (INDEPENDENT_AMBULATORY_CARE_PROVIDER_SITE_OTHER): Payer: Medicare Other | Admitting: Cardiology

## 2018-09-29 ENCOUNTER — Encounter: Payer: Self-pay | Admitting: Cardiology

## 2018-09-29 VITALS — BP 135/46 | HR 66 | Ht 70.0 in

## 2018-09-29 DIAGNOSIS — R0789 Other chest pain: Secondary | ICD-10-CM

## 2018-09-29 DIAGNOSIS — I2510 Atherosclerotic heart disease of native coronary artery without angina pectoris: Secondary | ICD-10-CM

## 2018-09-29 MED ORDER — ISOSORBIDE MONONITRATE ER 30 MG PO TB24: ORAL_TABLET | ORAL | 3 refills | Status: DC

## 2018-09-29 NOTE — Progress Notes (Signed)
Virtual Visit via Telephone Note   This visit type was conducted due to national recommendations for restrictions regarding the COVID-19 Pandemic (e.g. social distancing) in an effort to limit this patient's exposure and mitigate transmission in our community.  Due to his co-morbid illnesses, this patient is at least at moderate risk for complications without adequate follow up.  This format is felt to be most appropriate for this patient at this time.  The patient did not have access to video technology/had technical difficulties with video requiring transitioning to audio format only (telephone).  All issues noted in this document were discussed and addressed.  No physical exam could be performed with this format.  Please refer to the patient's chart for his  consent to telehealth for [facility].   Date:  09/29/2018   ID:  Alan [name], DOB 1941/01/04, MRN [id number]  Patient Location: Home Provider Location: Office  PCP:  Vidal Schwalbe, MD  Cardiologist:  Carlyle Dolly, MD  Electrophysiologist:  None   Evaluation Performed:  Follow-Up Visit  Chief Complaint:  Follow up, post hopsital f/u  History of Present Illness:    Alan [name] is a 78 y.o. male seen today for follow up of the following medical problems. This is a focused visit on recent admission with ches tpain   1. CAD - prior [id number] at [facility] 2 vessel: LIMA-LAD, SVG-OM1and stenting - history of chronic chest pains, particularly during HD  - Admit 01/2017 with angina. Cath showed Mid RCA 50%, RPLB second Jasin Brazel 90%, OM1 70%, LCX 100%. Occluded LIMA-LAD, occluded SV-OM1. Recs for medical therapy of his OM disease 01/2017 echo LVEF 55-60%, grade II diastolic dysfunction  - admit recently 08/2018 with chest pains. No acute EKG changes, no significant trop elevation.  - epsidoes 1-2 times per week, chronic and stable.       The patient does not have symptoms concerning for COVID-19 infection (fever,  chills, cough, or new shortness of breath).    Past Medical History:  Diagnosis Date  . Arthritis   . Asthma   . Clotted renal dialysis arteriovenous graft ([place])   . Coronary artery disease    a. s/p CABG in 1996 with LIMA-LAD and SVG-OM1 b. 01/2017: cath showing occluded SVG-OM1 and atretic LIMA-LAD. Diffuse disease along 1st Mrg and PDA with medical therapy recommended.   . CVA (cerebral infarction)   . DDD (degenerative disc disease), cervical   . DDD (degenerative disc disease), lumbar   . Depression   . Diabetes mellitus   . ESRD (end stage renal disease) on dialysis ([place])   . Gangrene of toe ([place]) Feb. 2015   Right  great    . Gastric ulcer    EGD 2014  . Gout   . Hypercholesteremia   . Hypertension   . Sternal wound dehiscence    chronic  . Stroke ([place])   . 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 Thrombosis of renal dialysis arteriovenous graft [facility])    Past Surgical History:  Procedure Laterality Date  . A/V FISTULAGRAM Left 05/28/2016   Procedure: A/V Fistulagram;  Surgeon: Katha Cabal, MD;  Location: [place] [facility];  Service: Cardiovascular;  Laterality: Left;  . A/V FISTULAGRAM Left 10/28/2016   Procedure: A/V Fistulagram;  Surgeon: Algernon Huxley, MD;  Location: [place] [facility];  Service: Cardiovascular;  Laterality: Left;  . A/V FISTULAGRAM Left 03/03/2017   Procedure: A/V FISTULAGRAM;  Surgeon: Algernon Huxley, MD;  Location: [place] [facility];  Service: Cardiovascular;  Laterality: Left;  . A/V SHUNT INTERVENTION N/A 05/28/2016   Procedure: A/V Shunt Intervention;  Surgeon: Katha Cabal, MD;  Location: [place] [facility];  Service: Cardiovascular;  Laterality: N/A;  . A/V SHUNT INTERVENTION N/A 03/03/2017   Procedure: A/V SHUNT INTERVENTION;  Surgeon: Algernon Huxley, MD;  Location: [place] [facility];  Service: Cardiovascular;  Laterality: N/A;  . AMPUTATION Right 04/10/2013   Procedure: Right Below Knee Amputation;  Surgeon: Newt Minion,  MD;  Location: [place];  Service: Orthopedics;  Laterality: Right;  Right Below Knee Amputation  . APPENDECTOMY    . BELOW KNEE LEG AMPUTATION Bilateral   . CHOLECYSTECTOMY    . CORONARY ARTERY BYPASS GRAFT  1996   [place]  . ESOPHAGOGASTRODUODENOSCOPY Left 05/24/2012   [id number] dilated baggy but otherwise a normal/ Small hiatal hernia. Gastric ulcer -S/P biopsy  . LEFT HEART CATH AND CORS/GRAFTS ANGIOGRAPHY N/A 02/12/2017   Procedure: LEFT HEART CATH AND CORS/GRAFTS ANGIOGRAPHY;  Surgeon: Lorretta Harp, MD;  Location: [place] [facility];  Service: Cardiovascular;  Laterality: N/A;  . PERIPHERAL VASCULAR CATHETERIZATION N/A 08/09/2014   Procedure: A/V Shuntogram/Fistulagram;  Surgeon: Katha Cabal, MD;  Location: [place] [facility];  Service: Cardiovascular;  Laterality: N/A;  . PERIPHERAL VASCULAR CATHETERIZATION Left 08/09/2014   Procedure: A/V Shunt Intervention;  Surgeon: Katha Cabal, MD;  Location: [place] [facility];  Service: Cardiovascular;  Laterality: Left;     Current Meds  Medication Sig  . acetaminophen (TYLENOL) 500 MG tablet Take 1,000 mg by mouth at bedtime. *May take daily if needed but takes mostly at bedtime  . Amino Acid Infusion (PROSOL) 20 % SOLN Inject 1 vial into the vein once a week.  Marland Kitchen aspirin EC 81 MG tablet Take 81 mg by mouth daily.   Marland Kitchen atorvastatin (LIPITOR) 20 MG tablet Take 20 mg by mouth at bedtime.   . B Complex-C-Folic Acid (NEPHRO-VITE PO) Take 1 tablet by mouth at bedtime.   . budesonide-formoterol (SYMBICORT) 80-4.5 MCG/ACT inhaler Inhale 2 puffs into the lungs 2 (two) times daily as needed (for wheezing/shortness of breath.).  Marland Kitchen cinacalcet (SENSIPAR) 30 MG tablet Take 30 mg by mouth at bedtime.   . Coenzyme Q10 (COQ10) 100 MG CAPS Take 100 mg by mouth at bedtime.  . diphenhydrAMINE (BENADRYL) 25 mg capsule Take 25 mg by mouth at bedtime.   . furosemide (LASIX) 20 MG tablet Take 20 mg by mouth daily.  . isosorbide mononitrate (IMDUR) 30  MG 24 hr tablet Take one half of 1 tablet twice a day (Patient taking differently: Take 15 mg by mouth 2 (two) times daily. Take one half of 1 tablet twice a day)  . LANTUS SOLOSTAR 100 UNIT/ML Solostar Pen Inject 9 Units into the skin at bedtime.   . Liniments (SALONPAS PAIN RELIEF PATCH EX) Place 1 patch onto the skin daily as needed (for pain relief.).   Marland Kitchen lisinopril (PRINIVIL,ZESTRIL) 5 MG tablet Take 5 mg by mouth daily.  . nitroGLYCERIN (NITROSTAT) 0.4 MG SL tablet Place 1 tablet (0.4 mg total) under the tongue every 5 (five) minutes as needed for chest pain.  Marland Kitchen omega-3 acid ethyl esters (LOVAZA) 1 g capsule Take 1 g by mouth daily.  Marland Kitchen omeprazole (PRILOSEC) 20 MG capsule Take 1 capsule by mouth 2 (two) times a day.   . sevelamer carbonate (RENVELA) 800 MG tablet Take 800 mg by mouth 3 (three) times daily with meals.      Allergies:  Codeine and Yellow jacket venom [bee venom]   Social History   Tobacco Use  . Smoking status: Former Smoker    Quit date: 08/08/2004    Years since quitting: 14.1  . Smokeless tobacco: Never Used  Substance Use Topics  . Alcohol use: No  . Drug use: No     Family Hx: The patient's family history includes Cancer in his mother. There is no history of Colon cancer.  ROS:   Please see the history of present illness.    All other systems reviewed and are negative.   Prior CV studies:   The following studies were reviewed today:  Labs/Other Tests and Data Reviewed:    EKG:  No ECG reviewed.  Recent Labs: 01/07/2018: Magnesium 1.8 06/01/2018: B Natriuretic Peptide 215.0 09/04/2018: ALT 13; BUN 42; Creatinine, Ser 2.65; Hemoglobin 12.2; Platelets 204; Potassium 2.9; Sodium 136   Recent Lipid Panel Lab Results  Component Value Date/Time   CHOL 224 (H) 02/11/2017 08:09 AM   TRIG 86 02/11/2017 08:09 AM   HDL 39 (L) 02/11/2017 08:09 AM   CHOLHDL 5.7 02/11/2017 08:09 AM   LDLCALC 168 (H) 02/11/2017 08:09 AM    Wt Readings from Last 3  Encounters:  09/04/18 129 lb (58.5 kg)  07/17/18 199 lb 15.3 oz (90.7 kg)  06/19/18 200 lb (90.7 kg)     Objective:    Vital Signs:  BP (!) 135/46   Pulse 66   Ht 5\' 10"  (1.778 m)   BMI 18.51 kg/m    Unable to assess, patient with altered mental status. History provided by wife  ASSESSMENT & PLAN:    1. CAD/Chest pain - chronic chest pain symptoms that are atypical, recent admission with negative workup for ACS - trial of increasing imdur to 30mg  bid and monitor symptoms     COVID-19 Education: The signs and symptoms of COVID-19 were discussed with the patient and how to seek care for testing (follow up with PCP or arrange E-visit).  The importance of social distancing was discussed today.  Time:   Today, I have spent 15 minutes with the patient with telehealth technology discussing the above problems.     Medication Adjustments/Labs and Tests Ordered: Current medicines are reviewed at length with the patient today.  Concerns regarding medicines are outlined above.   Tests Ordered: No orders of the defined types were placed in this encounter.   Medication Changes: No orders of the defined types were placed in this encounter.   Follow Up:  Virtual Visit in 3 month(s)  Signed, Carlyle Dolly, MD  09/29/2018 1:00 PM    [place]

## 2018-09-29 NOTE — Patient Instructions (Signed)
Medication Instructions:  INCREASE IMDUR TO 30 MG - TWO TIMES DAILY   Labwork: NONE  Testing/Procedures: NONE  Follow-Up: Your physician recommends that you schedule a follow-up appointment in: 3 MONTHS FOR A VIRTUAL VISIT    Any Other Special Instructions Alan [name] Listed Below (If Applicable).     If you need a refill on your cardiac medications before your next appointment, please call your pharmacy.

## 2018-09-29 NOTE — Addendum Note (Signed)
Addended byDebbora Lacrosse R on: 09/29/2018 02:00 PM   Modules accepted: Orders

## 2018-10-06 ENCOUNTER — Encounter: Payer: Self-pay | Admitting: Orthopaedic Surgery

## 2018-10-06 ENCOUNTER — Ambulatory Visit: Payer: Medicare Other | Admitting: Orthopaedic Surgery

## 2018-11-02 ENCOUNTER — Telehealth (INDEPENDENT_AMBULATORY_CARE_PROVIDER_SITE_OTHER): Payer: Self-pay

## 2018-11-02 NOTE — Telephone Encounter (Signed)
A fax was received for a permcath removal for the patient. Patient has been scheduled for 11/05/2018 with Dr. Lucky Cowboy with a 8:15 am arrival to the [facility]. Patient [name] do his Covid testing on 11/03/2018 between 12:30-2:30 pm at the [facility]. The pre-procedure instructions Mainor be faxed back to [facility] dialysis attention [name].

## 2018-11-04 ENCOUNTER — Other Ambulatory Visit (INDEPENDENT_AMBULATORY_CARE_PROVIDER_SITE_OTHER): Payer: Self-pay | Admitting: Nurse Practitioner

## 2018-11-04 ENCOUNTER — Encounter (INDEPENDENT_AMBULATORY_CARE_PROVIDER_SITE_OTHER): Payer: Self-pay

## 2018-11-04 NOTE — Telephone Encounter (Signed)
Patient has been scheduled for a left arm fistulagram instead of a permcath removal for 11/05/2018.

## 2018-11-04 NOTE — Telephone Encounter (Signed)
Patient's appt has been moved  To 11/19/2018 due to the patient not doing his Covid testing and having to be on a Thursday. Dr. Lucky Cowboy Jackson not be available on 11/12/2018. New angio paperwork has been faxed to attention [name] at [facility] dialysis.

## 2018-11-16 ENCOUNTER — Other Ambulatory Visit: Admission: RE | Admit: 2018-11-16 | Payer: Medicare Other | Source: Ambulatory Visit

## 2018-11-18 ENCOUNTER — Telehealth (INDEPENDENT_AMBULATORY_CARE_PROVIDER_SITE_OTHER): Payer: Self-pay

## 2018-11-18 ENCOUNTER — Other Ambulatory Visit (INDEPENDENT_AMBULATORY_CARE_PROVIDER_SITE_OTHER): Payer: Self-pay | Admitting: Nurse Practitioner

## 2018-11-18 NOTE — Telephone Encounter (Signed)
I contacted [name] at [facility] regarding the patient not getting his Covid test taken. [name] stated she did not know why the patient didn't do his test. I explained that the patient Alan [name] be canceled for his procedure on 11/19/2018 because of his not having the test done. I asked that she let me know when the patient is ready to be rescheduled.

## 2018-11-19 ENCOUNTER — Ambulatory Visit: Admission: RE | Admit: 2018-11-19 | Payer: Medicare Other | Source: Home / Self Care | Admitting: Vascular Surgery

## 2018-11-19 ENCOUNTER — Encounter: Admission: RE | Payer: Self-pay | Source: Home / Self Care

## 2018-11-19 SURGERY — A/V FISTULAGRAM
Anesthesia: Moderate Sedation | Laterality: Left

## 2018-12-31 IMAGING — DX DG CHEST 2V
2 series · 2 of 2 positions shown · non-contrast
Comparison: 08/29/2017 and 05/12/2017 radiographs.

CLINICAL DATA: Dialysis patient. Non radiating left chest pain for
5 days.

EXAM:
CHEST - 2 VIEW

[chest lat]
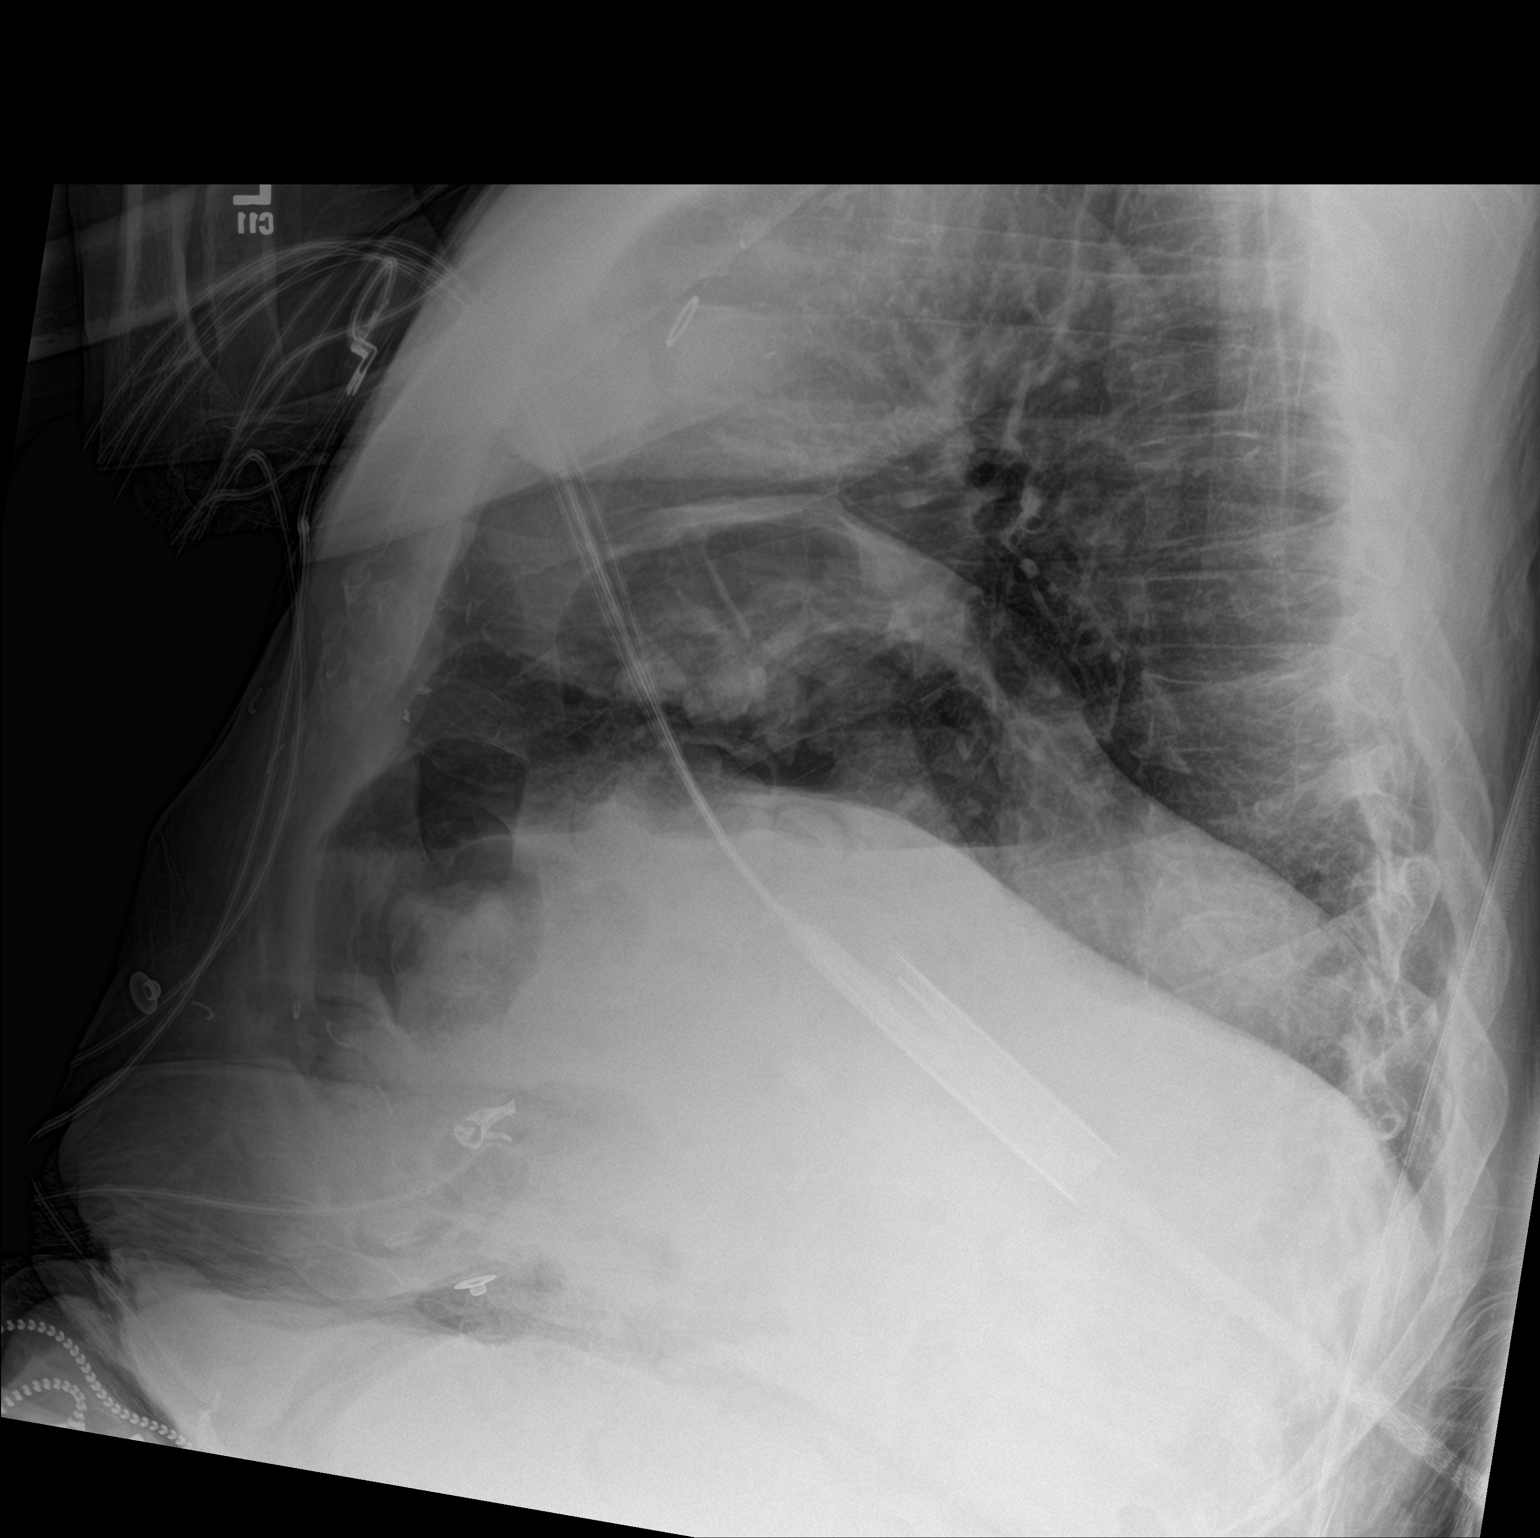

[chest ap]
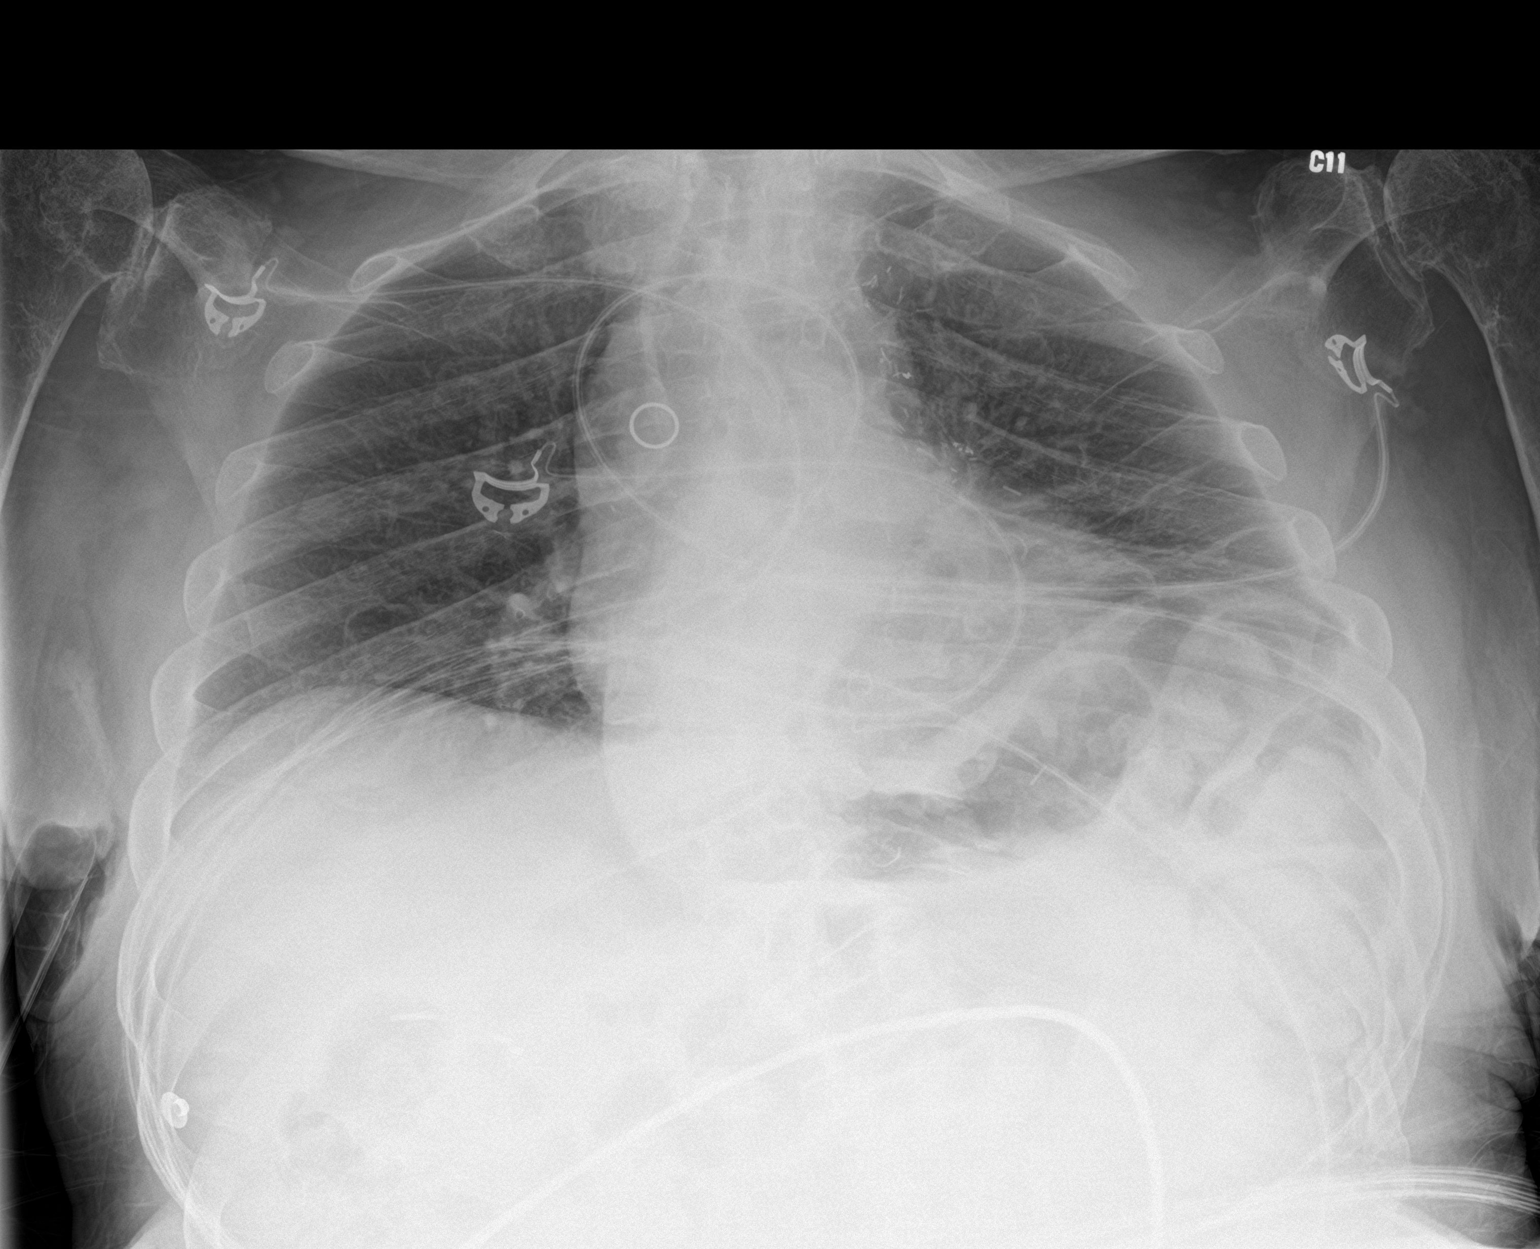

[2 of 2 positions shown; findings below may reference images not displayed]

FINDINGS: Lordotic positioning and suboptimal inspiration on the frontal
examination. The heart size and mediastinal contours are stable
status post cardiac surgery. There is aortic tortuosity. There is
increased elevation of the left hemidiaphragm with mildly increased
left basilar atelectasis. No confluent airspace opacity, pleural
effusion or pneumothorax identified. Glenohumeral degenerative
changes and evidence of chronic rotator cuff tears are again noted
bilaterally.
IMPRESSION: Lower lung volumes with mildly increased left basilar atelectasis.
No edema or confluent airspace opacity.

## 2019-01-04 ENCOUNTER — Other Ambulatory Visit: Payer: Self-pay

## 2019-01-04 ENCOUNTER — Telehealth: Payer: Medicare Other | Admitting: Cardiology

## 2019-01-04 NOTE — Progress Notes (Unsigned)
{Choose 1 Note Type (Telehealth Visit or Telephone Visit):[id number]}   Date:  01/04/2019   ID:  [name], DOB [birth year], MRN [id number]  {Patient [phone]::"Home"} {Provider Location:[phone]::"Home"}  PCP:  Vidal Schwalbe, MD  Cardiologist:  Carlyle Dolly, MD *** Electrophysiologist:  None   Evaluation Performed:  {Choose Visit Type:[id number]::"Follow-Up Visit"}  Chief Complaint:  ***  History of Present Illness:    Alan [name] is a 78 y.o. male with ***  1. CAD -prior [id number] at [facility] 2 vessel: LIMA-LAD, SVG-OM1and stenting - history of chronic chest pains, particularly during HD  -Admit 01/2017 with angina. Cath showed Mid RCA 50%, RPLB second branch 90%, OM1 70%, LCX 100%. Occluded LIMA-LAD, occluded SV-OM1. Recs for medical therapy of his OM disease 01/2017 echo LVEF 55-60%, grade II diastolic dysfunction  - admit recently 08/2018 with chest pains. No acute EKG changes, no significant trop elevation.  - epsidoes 1-2 times per week, chronic and stable.      The patient {does/does not:200015} have symptoms concerning for COVID-19 infection (fever, chills, cough, or new shortness of breath).    Past Medical History:  Diagnosis Date  . Arthritis   . Asthma   . Clotted renal dialysis arteriovenous graft ([place])   . Coronary artery disease    a. s/p CABG in 1996 with LIMA-LAD and SVG-OM1 b. 01/2017: cath showing occluded SVG-OM1 and atretic LIMA-LAD. Diffuse disease along 1st Mrg and PDA with medical therapy recommended.   . CVA (cerebral infarction)   . DDD (degenerative disc disease), cervical   . DDD (degenerative disc disease), lumbar   . Depression   . Diabetes mellitus   . ESRD (end stage renal disease) on dialysis ([place])   . Gangrene of toe ([place]) Feb. 2015   Right  great    . Gastric ulcer    EGD 2014  . Gout   . Hypercholesteremia   . Hypertension   . Sternal wound dehiscence    chronic  . Stroke ([place])   . Subclinical  hyperthyroidism 08/15/2015  . Thrombosis of renal dialysis arteriovenous graft [facility])    Past Surgical History:  Procedure Laterality Date  . A/V FISTULAGRAM Left 05/28/2016   Procedure: A/V Fistulagram;  Surgeon: Katha Cabal, MD;  Location: [place] CV LAB;  Service: Cardiovascular;  Laterality: Left;  . A/V FISTULAGRAM Left 10/28/2016   Procedure: A/V Fistulagram;  Surgeon: Algernon Huxley, MD;  Location: [place] CV LAB;  Service: Cardiovascular;  Laterality: Left;  . A/V FISTULAGRAM Left 03/03/2017   Procedure: A/V FISTULAGRAM;  Surgeon: Algernon Huxley, MD;  Location: [place] CV LAB;  Service: Cardiovascular;  Laterality: Left;  . A/V SHUNT INTERVENTION N/A 05/28/2016   Procedure: A/V Shunt Intervention;  Surgeon: Katha Cabal, MD;  Location: [place] CV LAB;  Service: Cardiovascular;  Laterality: N/A;  . A/V SHUNT INTERVENTION N/A 03/03/2017   Procedure: A/V SHUNT INTERVENTION;  Surgeon: Algernon Huxley, MD;  Location: [place] CV LAB;  Service: Cardiovascular;  Laterality: N/A;  . AMPUTATION Right 04/10/2013   Procedure: Right Below Knee Amputation;  Surgeon: Newt Minion, MD;  Location: [place];  Service: Orthopedics;  Laterality: Right;  Right Below Knee Amputation  . APPENDECTOMY    . BELOW KNEE LEG AMPUTATION Bilateral   . CHOLECYSTECTOMY    . CORONARY ARTERY BYPASS GRAFT  1996   [place]  . ESOPHAGOGASTRODUODENOSCOPY Left 05/24/2012   [id number] dilated baggy but otherwise a normal/ Small hiatal hernia. Gastric ulcer -S/P biopsy  . LEFT  HEART CATH AND CORS/GRAFTS ANGIOGRAPHY N/A 02/12/2017   Procedure: LEFT HEART CATH AND CORS/GRAFTS ANGIOGRAPHY;  Surgeon: Lorretta Harp, MD;  Location: [place] CV LAB;  Service: Cardiovascular;  Laterality: N/A;  . PERIPHERAL VASCULAR CATHETERIZATION N/A 08/09/2014   Procedure: A/V Shuntogram/Fistulagram;  Surgeon: Katha Cabal, MD;  Location: [place] CV LAB;  Service: Cardiovascular;  Laterality: N/A;  .  PERIPHERAL VASCULAR CATHETERIZATION Left 08/09/2014   Procedure: A/V Shunt Intervention;  Surgeon: Katha Cabal, MD;  Location: [place] CV LAB;  Service: Cardiovascular;  Laterality: Left;     No outpatient medications have been marked as taking for the 01/04/19 encounter (Appointment) with [name], MD.     Allergies:   Codeine and Yellow jacket venom [bee venom]   Social History   Tobacco Use  . Smoking status: Former Smoker    Quit date: 08/08/2004    Years since quitting: 14.4  . Smokeless tobacco: Never Used  Substance Use Topics  . Alcohol use: No  . Drug use: No     Family Hx: The patient's family history includes Cancer in his mother. There is no history of Colon cancer.  ROS:   Please see the history of present illness.    *** All other systems reviewed and are negative.   Prior CV studies:   The following studies were reviewed today:  ***  Labs/Other Tests and Data Reviewed:    EKG:  {EKG/Telemetry Strips [phone]}  Recent Labs: 01/07/2018: Magnesium 1.8 06/01/2018: B Natriuretic Peptide 215.0 09/04/2018: ALT 13; BUN 42; Creatinine, Ser 2.65; Hemoglobin 12.2; Platelets 204; Potassium 2.9; Sodium 136   Recent Lipid Panel Lab Results  Component Value Date/Time   CHOL 224 (H) 02/11/2017 08:09 AM   TRIG 86 02/11/2017 08:09 AM   HDL 39 (L) 02/11/2017 08:09 AM   CHOLHDL 5.7 02/11/2017 08:09 AM   LDLCALC 168 (H) 02/11/2017 08:09 AM    Wt Readings from Last 3 Encounters:  09/04/18 129 lb (58.5 kg)  07/17/18 199 lb 15.3 oz (90.7 kg)  06/19/18 200 lb (90.7 kg)     Objective:    Vital Signs:  There were no vitals taken for this visit.   {[facility] Virtual Exam (Optional):[phone]::"VITAL SIGNS:  reviewed"}  ASSESSMENT & PLAN:    1. CAD/Chest pain - chronic chest pain symptoms that are atypical, recent admission with negative workup for ACS - trial of increasing imdur to 30mg  bid and monitor symptoms     COVID-19  Education: The signs and symptoms of COVID-19 were discussed with the patient and how to seek care for testing (follow up with PCP or arrange E-visit).  ***The importance of social distancing was discussed today.  Time:   Today, I have spent *** minutes with the patient with telehealth technology discussing the above problems.     Medication Adjustments/Labs and Tests Ordered: Current medicines are reviewed at length with the patient today.  Concerns regarding medicines are outlined above.   Tests Ordered: No orders of the defined types were placed in this encounter.   Medication Changes: No orders of the defined types were placed in this encounter.   Follow Up:  {F/U [phone]} {follow up:15908}  Signed, Carlyle Dolly, MD  01/04/2019 12:48 PM    [facility]

## 2019-01-05 ENCOUNTER — Telehealth (INDEPENDENT_AMBULATORY_CARE_PROVIDER_SITE_OTHER): Payer: Medicare Other | Admitting: Cardiology

## 2019-01-05 ENCOUNTER — Encounter: Payer: Self-pay | Admitting: Cardiology

## 2019-01-05 VITALS — Ht 63.0 in | Wt 155.0 lb

## 2019-01-05 DIAGNOSIS — I2510 Atherosclerotic heart disease of native coronary artery without angina pectoris: Secondary | ICD-10-CM

## 2019-01-05 DIAGNOSIS — R0789 Other chest pain: Secondary | ICD-10-CM

## 2019-01-05 MED ORDER — ISOSORBIDE MONONITRATE ER 60 MG PO TB24: 60.0000 mg | ORAL_TABLET | Freq: Two times a day (BID) | ORAL | 1 refills | Status: AC

## 2019-01-05 NOTE — Progress Notes (Signed)
Virtual Visit via Telephone Note   This visit type was conducted due to national recommendations for restrictions regarding the COVID-19 Pandemic (e.g. social distancing) in an effort to limit this patient's exposure and mitigate transmission in our community.  Due to his co-morbid illnesses, this patient is at least at moderate risk for complications without adequate follow up.  This format is felt to be most appropriate for this patient at this time.  The patient did not have access to video technology/had technical difficulties with video requiring transitioning to audio format only (telephone).  All issues noted in this document were discussed and addressed.  No physical exam could be performed with this format.  Please refer to the patient's chart for his  consent to telehealth for [facility].   Date:  01/05/2019   ID:  Alan [name], DOB [birth year], MRN [id number]  Patient Location: Home Provider Location: Home  PCP:  Vidal Schwalbe, MD  Cardiologist:  Carlyle Dolly, MD  Electrophysiologist:  None   Evaluation Performed:  Follow-Up Visit  Chief Complaint:  Follow up visit  History of Present Illness:    Alan [name] is a 78 y.o. male seen today for follow up of the following medical problems .   1. CAD -prior [id number] at [facility] 2 vessel: LIMA-LAD, SVG-OM1and stenting - history of chronic chest pains, particularly during HD  -Admit 01/2017 with angina. Cath showed Mid RCA 50%, RPLB second [name] 90%, OM1 70%, LCX 100%. Occluded atretic LIMA-LAD, occluded SV-OM1. Recs for medical therapy of his OM disease 01/2017 echo LVEF 55-60%, grade II diastolic dysfunction  - [id number] with chest pains. No acute EKG changes, no significant trop elevation.    - since last visit chronic chest pains are unchanged. Only occurring during dialysis.    2. ESRD   The patient does not have symptoms concerning for COVID-19 infection (fever, chills, cough, or new shortness of  breath).    Past Medical History:  Diagnosis Date  . Arthritis   . Asthma   . Clotted renal dialysis arteriovenous graft ([place])   . Coronary artery disease    a. s/p CABG in 1996 with LIMA-LAD and SVG-OM1 b. 01/2017: cath showing occluded SVG-OM1 and atretic LIMA-LAD. Diffuse disease along 1st Mrg and PDA with medical therapy recommended.   . CVA (cerebral infarction)   . DDD (degenerative disc disease), cervical   . DDD (degenerative disc disease), lumbar   . Depression   . Diabetes mellitus   . ESRD (end stage renal disease) on dialysis ([place])   . Gangrene of toe ([place]) Feb. 2015   Right  great    . Gastric ulcer    EGD 2014  . Gout   . Hypercholesteremia   . Hypertension   . Sternal wound dehiscence    chronic  . Stroke ([place])   . 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 Thrombosis of renal dialysis arteriovenous graft [facility])    Past Surgical History:  Procedure Laterality Date  . A/V FISTULAGRAM Left 05/28/2016   Procedure: A/V Fistulagram;  Surgeon: Katha Cabal, MD;  Location: [place] CV LAB;  Service: Cardiovascular;  Laterality: Left;  . A/V FISTULAGRAM Left 10/28/2016   Procedure: A/V Fistulagram;  Surgeon: Algernon Huxley, MD;  Location: [place] CV LAB;  Service: Cardiovascular;  Laterality: Left;  . A/V FISTULAGRAM Left 03/03/2017   Procedure: A/V FISTULAGRAM;  Surgeon: Algernon Huxley, MD;  Location: [place] CV LAB;  Service: Cardiovascular;  Laterality: Left;  . A/V SHUNT INTERVENTION  N/A 05/28/2016   Procedure: A/V Shunt Intervention;  Surgeon: Katha Cabal, MD;  Location: [place] CV LAB;  Service: Cardiovascular;  Laterality: N/A;  . A/V SHUNT INTERVENTION N/A 03/03/2017   Procedure: A/V SHUNT INTERVENTION;  Surgeon: Algernon Huxley, MD;  Location: [place] CV LAB;  Service: Cardiovascular;  Laterality: N/A;  . AMPUTATION Right 04/10/2013   Procedure: Right Below Knee Amputation;  Surgeon: Newt Minion, MD;  Location: [place];  Service:  Orthopedics;  Laterality: Right;  Right Below Knee Amputation  . APPENDECTOMY    . BELOW KNEE LEG AMPUTATION Bilateral   . CHOLECYSTECTOMY    . CORONARY ARTERY BYPASS GRAFT  1996   [place]  . ESOPHAGOGASTRODUODENOSCOPY Left 05/24/2012   [id number] dilated baggy but otherwise a normal/ Small hiatal hernia. Gastric ulcer -S/P biopsy  . LEFT HEART CATH AND CORS/GRAFTS ANGIOGRAPHY N/A 02/12/2017   Procedure: LEFT HEART CATH AND CORS/GRAFTS ANGIOGRAPHY;  Surgeon: Lorretta Harp, MD;  Location: [place] CV LAB;  Service: Cardiovascular;  Laterality: N/A;  . PERIPHERAL VASCULAR CATHETERIZATION N/A 08/09/2014   Procedure: A/V Shuntogram/Fistulagram;  Surgeon: Katha Cabal, MD;  Location: [place] CV LAB;  Service: Cardiovascular;  Laterality: N/A;  . PERIPHERAL VASCULAR CATHETERIZATION Left 08/09/2014   Procedure: A/V Shunt Intervention;  Surgeon: Katha Cabal, MD;  Location: [place] CV LAB;  Service: Cardiovascular;  Laterality: Left;     No outpatient medications have been marked as taking for the 01/05/19 encounter (Appointment) with [name], MD.     Allergies:   Codeine and Yellow jacket venom [bee venom]   Social History   Tobacco Use  . Smoking status: Former Smoker    Quit date: 08/08/2004    Years since quitting: 14.4  . Smokeless tobacco: Never Used  Substance Use Topics  . Alcohol use: No  . Drug use: No     Family Hx: The patient's family history includes Cancer in his mother. There is no history of Colon cancer.  ROS:   Please see the history of present illness.    All other systems reviewed and are negative.   Prior CV studies:   The following studies were reviewed today:    Labs/Other Tests and Data Reviewed:    EKG:  No ECG reviewed.  Recent Labs: 01/07/2018: Magnesium 1.8 06/01/2018: B Natriuretic Peptide 215.0 09/04/2018: ALT 13; BUN 42; Creatinine, Ser 2.65; Hemoglobin 12.2; Platelets 204; Potassium 2.9; Sodium 136    Recent Lipid Panel Lab Results  Component Value Date/Time   CHOL 224 (H) 02/11/2017 08:09 AM   TRIG 86 02/11/2017 08:09 AM   HDL 39 (L) 02/11/2017 08:09 AM   CHOLHDL 5.7 02/11/2017 08:09 AM   LDLCALC 168 (H) 02/11/2017 08:09 AM    Wt Readings from Last 3 Encounters:  09/04/18 129 lb (58.5 kg)  07/17/18 199 lb 15.3 oz (90.7 kg)  06/19/18 200 lb (90.7 kg)     Objective:    Vital Signs:   Today's Vitals   01/05/19 0951  Weight: 155 lb (70.3 kg)  Height: 5\' 3"  (1.6 m)   Body mass index is 27.46 kg/m.  Normal affect. Normal speech pattern and tone. Comfortable, no apparent distress. No audible signs of SOB or wheezing.   ASSESSMENT & PLAN:    1. CAD/Chest pain - chronic chest pain symptoms that are atypical, only occur during HD - [place] try increasing imdur to 60mg  bid to see if improves symptoms    COVID-19 Education: The signs  and symptoms of COVID-19 were discussed with the patient and how to seek care for testing (follow up with PCP or arrange E-visit).  The importance of social distancing was discussed today.  Time:   Today, I have spent 13 minutes with the patient with telehealth technology discussing the above problems.     Medication Adjustments/Labs and Tests Ordered: Current medicines are reviewed at length with the patient today.  Concerns regarding medicines are outlined above.   Tests Ordered: No orders of the defined types were placed in this encounter.   Medication Changes: No orders of the defined types were placed in this encounter.   Follow Up:  Virtual Visit  in 6 month(s)  Signed, Carlyle Dolly, MD  01/05/2019 7:58 AM    [place]

## 2019-01-05 NOTE — Addendum Note (Signed)
Addended by: Julian Hy T on: 01/05/2019 10:54 AM   Modules accepted: Orders

## 2019-01-05 NOTE — Patient Instructions (Signed)
Your physician wants you to follow-up in : [place] receive a reminder letter in the mail two months in advance. If you don't receive a letter, please call our office to schedule the follow-up appointment.  Your physician has recommended you make the following change in your medication:   INCREASE IMDUR (ISOSORBIDE) 60 MG TWICE DAILY  Thank you for choosing [facility]!!

## 2019-01-16 ENCOUNTER — Other Ambulatory Visit: Payer: Self-pay

## 2019-01-16 ENCOUNTER — Emergency Department (HOSPITAL_COMMUNITY)
Admission: EM | Admit: 2019-01-16 | Discharge: 2019-01-16 | Disposition: A | Discharge status: Home / Self Care | Payer: Medicare Other | Attending: Emergency Medicine | Admitting: Emergency Medicine

## 2019-01-16 ENCOUNTER — Encounter (HOSPITAL_COMMUNITY): Payer: Self-pay | Admitting: Emergency Medicine

## 2019-01-16 DIAGNOSIS — B888 Other specified infestations: Secondary | ICD-10-CM | POA: Diagnosis present

## 2019-01-16 DIAGNOSIS — J45909 Unspecified asthma, uncomplicated: Secondary | ICD-10-CM | POA: Diagnosis not present

## 2019-01-16 DIAGNOSIS — Z951 Presence of aortocoronary bypass graft: Secondary | ICD-10-CM | POA: Diagnosis not present

## 2019-01-16 DIAGNOSIS — I2510 Atherosclerotic heart disease of native coronary artery without angina pectoris: Secondary | ICD-10-CM | POA: Diagnosis not present

## 2019-01-16 DIAGNOSIS — Z79899 Other long term (current) drug therapy: Secondary | ICD-10-CM | POA: Diagnosis not present

## 2019-01-16 DIAGNOSIS — Z794 Long term (current) use of insulin: Secondary | ICD-10-CM | POA: Diagnosis not present

## 2019-01-16 DIAGNOSIS — Z87891 Personal history of nicotine dependence: Secondary | ICD-10-CM | POA: Insufficient documentation

## 2019-01-16 DIAGNOSIS — Z7982 Long term (current) use of aspirin: Secondary | ICD-10-CM | POA: Insufficient documentation

## 2019-01-16 DIAGNOSIS — Z992 Dependence on renal dialysis: Secondary | ICD-10-CM | POA: Insufficient documentation

## 2019-01-16 DIAGNOSIS — I120 Hypertensive chronic kidney disease with stage 5 chronic kidney disease or end stage renal disease: Secondary | ICD-10-CM | POA: Insufficient documentation

## 2019-01-16 DIAGNOSIS — Z8673 Personal history of transient ischemic attack (TIA), and cerebral infarction without residual deficits: Secondary | ICD-10-CM | POA: Insufficient documentation

## 2019-01-16 DIAGNOSIS — N186 End stage renal disease: Secondary | ICD-10-CM | POA: Diagnosis not present

## 2019-01-16 DIAGNOSIS — E1122 Type 2 diabetes mellitus with diabetic chronic kidney disease: Secondary | ICD-10-CM | POA: Insufficient documentation

## 2019-01-16 MED ORDER — DIPHENHYDRAMINE HCL 25 MG PO CAPS
25.0000 mg | ORAL_CAPSULE | Freq: Once | ORAL | Status: AC
  Administered 2019-01-16: 25 mg via ORAL
  Filled 2019-01-16: qty 1

## 2019-01-16 NOTE — ED Triage Notes (Signed)
Pt states that he has some small black bugs eatting on him in his bed he has wounds on his back from them

## 2019-01-16 NOTE — ED Notes (Signed)
Found 4 bed bugs crawling on pt and belongings

## 2019-01-16 NOTE — ED Provider Notes (Signed)
[facility] EMERGENCY DEPARTMENT Provider Note   CSN: [id number] Arrival date & time: 01/16/19  1049     History   Chief Complaint Chief Complaint  Patient presents with  . Insect Bite    HPI Alan [name] is a 78 y.o. male.     HPI   Alan [name] is a 78 y.o. male with past medical history of coronary artery disease, diabetes, end-stage renal disease on dialysis, hypertension and bilateral BKA's who presents to the Emergency Department requesting evaluation for possible bedbug bites.  He states that he has been itching severely on his back and both arms for several days.  He states that he and his wife have noticed several small bugs in their bed.  He was seen by his primary care provider and prescribed a topical cream that he has been using daily for the last for 5 days without relief.  He states they have wash the bed linens, but have not had the house exterminated or cleaned.  He denies swelling, fever, chills, pain, or swelling of his face lips or tongue.   Past Medical History:  Diagnosis Date  . Arthritis   . Asthma   . Clotted renal dialysis arteriovenous graft ([place])   . Coronary artery disease    a. s/p CABG in 1996 with LIMA-LAD and SVG-OM1 b. 01/2017: cath showing occluded SVG-OM1 and atretic LIMA-LAD. Diffuse disease along 1st Mrg and PDA with medical therapy recommended.   . CVA (cerebral infarction)   . DDD (degenerative disc disease), cervical   . DDD (degenerative disc disease), lumbar   . Depression   . Diabetes mellitus   . ESRD (end stage renal disease) on dialysis ([place])   . Gangrene of toe ([place]) Feb. 2015   Right  great    . Gastric ulcer    EGD 2014  . Gout   . Hypercholesteremia   . Hypertension   . Sternal wound dehiscence    chronic  . Stroke ([place])   . 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 Thrombosis of renal dialysis arteriovenous graft [facility])     Patient Active Problem List   Diagnosis Date Noted  . Angina at rest [facility]) 01/23/2018  .  Hemodialysis-associated hypotension   . Left sided numbness 02/10/2017  . Hypertensive urgency 02/10/2017  . Hypokalemia 02/10/2017  . Chest pain 06/15/2016  . Renal dialysis device, implant, or graft complication [id number]  . Septic shock ([place]) 08/28/2015  . 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 Lower urinary tract infectious disease   . Essential hypertension 08/13/2015  . Nausea and vomiting 08/13/2015  . ESRD (end stage renal disease) ([place]) 04/12/2015  . Gastroenteritis 04/12/2015  . S/P bilateral BKA (below knee amputation) ([place]) 04/12/2015  . HLD (hyperlipidemia) 04/12/2015  . Acute chest pain 11/16/2014  . Atherosclerosis of native arteries of the extremities with gangrene ([place]) 04/20/2013  . Sepsis ([place]) 04/13/2013  . Gram-negative bacteremia 04/12/2013  . ESRD on dialysis ([place]) 04/09/2013  . 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 Protein-calorie malnutrition, severe ([place]) 04/09/2013  . Gangrene of toe ([place]) 04/08/2013  . Toe gangrene ([place]) 04/08/2013  . Nausea with vomiting 05/22/2012  . S/P BKA (below knee amputation) unilateral, left 05/20/2012  . Musculoskeletal chest pain 05/20/2012  . Acute renal failure ([place]) 05/19/2012  . CAD (coronary artery disease) with CABG 05/19/2012  . DM type 2 causing vascular disease ([place]) 05/19/2012  . Hyperkalemia 05/19/2012    Past Surgical History:  Procedure Laterality Date  . A/V FISTULAGRAM Left 05/28/2016  Procedure: A/V Fistulagram;  Surgeon: Katha Cabal, MD;  Location: [place] CV LAB;  Service: Cardiovascular;  Laterality: Left;  . A/V FISTULAGRAM Left 10/28/2016   Procedure: A/V Fistulagram;  Surgeon: Algernon Huxley, MD;  Location: [place] [facility];  Service: Cardiovascular;  Laterality: Left;  . A/V FISTULAGRAM Left 03/03/2017   Procedure: A/V FISTULAGRAM;  Surgeon: Algernon Huxley, MD;  Location: [place] CV LAB;  Service: Cardiovascular;  Laterality: Left;  . A/V SHUNT INTERVENTION N/A 05/28/2016    Procedure: A/V Shunt Intervention;  Surgeon: Katha Cabal, MD;  Location: [place] CV LAB;  Service: Cardiovascular;  Laterality: N/A;  . A/V SHUNT INTERVENTION N/A 03/03/2017   Procedure: A/V SHUNT INTERVENTION;  Surgeon: Algernon Huxley, MD;  Location: [place] [facility];  Service: Cardiovascular;  Laterality: N/A;  . AMPUTATION Right 04/10/2013   Procedure: Right Below Knee Amputation;  Surgeon: Newt Minion, MD;  Location: [place];  Service: Orthopedics;  Laterality: Right;  Right Below Knee Amputation  . APPENDECTOMY    . BELOW KNEE LEG AMPUTATION Bilateral   . CHOLECYSTECTOMY    . CORONARY ARTERY BYPASS GRAFT  1996   [place]  . ESOPHAGOGASTRODUODENOSCOPY Left 05/24/2012   [id number] dilated baggy but otherwise a normal/ Small hiatal hernia. Gastric ulcer -S/P biopsy  . LEFT HEART CATH AND CORS/GRAFTS ANGIOGRAPHY N/A 02/12/2017   Procedure: LEFT HEART CATH AND CORS/GRAFTS ANGIOGRAPHY;  Surgeon: Lorretta Harp, MD;  Location: [place] [facility];  Service: Cardiovascular;  Laterality: N/A;  . PERIPHERAL VASCULAR CATHETERIZATION N/A 08/09/2014   Procedure: A/V Shuntogram/Fistulagram;  Surgeon: Katha Cabal, MD;  Location: [place] [facility];  Service: Cardiovascular;  Laterality: N/A;  . PERIPHERAL VASCULAR CATHETERIZATION Left 08/09/2014   Procedure: A/V Shunt Intervention;  Surgeon: Katha Cabal, MD;  Location: [place] [facility] LAB;  Service: Cardiovascular;  Laterality: Left;        Home Medications    Prior to Admission medications   Medication Sig Start Date End Date Taking? Authorizing Provider  acetaminophen (TYLENOL) 500 MG tablet Take 1,000 mg by mouth at bedtime. *May take daily if needed but takes mostly at bedtime    [provider]  Amino Acid Infusion (PROSOL) 20 % SOLN Inject 1 vial into the vein once a week. 02/28/17   [provider]  aspirin EC 81 MG tablet Take 81 mg by mouth daily.     [provider]  atorvastatin  (LIPITOR) 20 MG tablet Take 20 mg by mouth at bedtime.  01/02/18   [provider]  B Complex-C-Folic Acid (NEPHRO-VITE PO) Take 1 tablet by mouth at bedtime.     [provider]  budesonide-formoterol (SYMBICORT) 80-4.5 MCG/ACT inhaler Inhale 2 puffs into the lungs 2 (two) times daily as needed (for wheezing/shortness of breath.).    [provider]  cinacalcet (SENSIPAR) 30 MG tablet Take 30 mg by mouth at bedtime.     [provider]  Coenzyme Q10 (COQ10) 100 MG CAPS Take 100 mg by mouth at bedtime.    [provider]  diphenhydrAMINE (BENADRYL) 25 mg capsule Take 25 mg by mouth at bedtime.     [provider]  furosemide (LASIX) 20 MG tablet Take 20 mg by mouth daily.    [provider]  isosorbide mononitrate (IMDUR) 60 MG 24 hr tablet Take 1 tablet (60 mg total) by mouth 2 (two) times daily. 01/05/19 04/05/19  [name], MD  LANTUS SOLOSTAR 100  UNIT/ML Solostar Pen Inject 9 Units into the skin at bedtime.  12/21/13   [provider]  Liniments (SALONPAS PAIN RELIEF PATCH EX) Place 1 patch onto the skin daily as needed (for pain relief.).     [provider]  lisinopril (PRINIVIL,ZESTRIL) 5 MG tablet Take 5 mg by mouth daily.    [provider]  nitroGLYCERIN (NITROSTAT) 0.4 MG SL tablet Place 1 tablet (0.4 mg total) under the tongue every 5 (five) minutes as needed for chest pain. 11/18/14   Samuella Cota, MD  omega-3 acid ethyl esters (LOVAZA) 1 g capsule Take 1 g by mouth daily.    [provider]  omeprazole (PRILOSEC) 20 MG capsule Take 1 capsule by mouth 2 (two) times a day.  03/20/17   [provider]  sevelamer carbonate (RENVELA) 800 MG tablet Take 800 mg by mouth 3 (three) times daily with meals.  01/07/18   [provider]    Family History Family History  Problem Relation Age of Onset  . Cancer Mother   . Colon cancer Neg Hx     Social History Social  History   Tobacco Use  . Smoking status: Former Smoker    Quit date: 08/08/2004    Years since quitting: 14.4  . Smokeless tobacco: Never Used  Substance Use Topics  . Alcohol use: No  . Drug use: No     Allergies   Codeine and Yellow jacket venom [bee venom]   Review of Systems Review of Systems  Constitutional: Negative for activity change, appetite change, chills and fever.  HENT: Negative for facial swelling, sore throat and trouble swallowing.   Respiratory: Negative for chest tightness, shortness of breath and wheezing.   Gastrointestinal: Negative for abdominal pain, diarrhea, nausea and vomiting.  Musculoskeletal: Negative for back pain and myalgias.  Skin: Positive for rash. Negative for wound.  Neurological: Negative for dizziness, weakness, numbness and headaches.     Physical Exam Updated Vital Signs BP (!) 179/77 (BP Location: Right Arm)   Pulse 69   Temp 97.9 F (36.6 C) (Oral)   Resp 14   Wt 70.3 kg   SpO2 100%   BMI 27.45 kg/m   Physical Exam Vitals signs and nursing note reviewed.  Constitutional:      General: He is not in acute distress.    Appearance: Normal appearance. He is not ill-appearing or toxic-appearing.  HENT:     Head: Atraumatic.  Cardiovascular:     Rate and Rhythm: Normal rate and regular rhythm.     Pulses: Normal pulses.  Pulmonary:     Effort: Pulmonary effort is normal.  Musculoskeletal:        General: No swelling or tenderness.     Comments: Pt has bilateral BKA's  Skin:    General: Skin is warm.     Comments: Few excoriations of the mid to upper back and bilateral axilla and forearms.  No edema, papules, welts or vesicles.    Neurological:     General: No focal deficit present.     Mental Status: He is alert.      ED Treatments / Results  Labs (all labs ordered are listed, but only abnormal results are displayed) Labs Reviewed - No data to display  EKG None  Radiology No results found.  Procedures  Procedures (including critical care time)  Medications Ordered in ED Medications  diphenhydrAMINE (BENADRYL) capsule 25 mg (25 mg Oral Given 01/16/19 1203)     Initial  Impression / Assessment and Plan / ED Course  I have reviewed the triage vital signs and the nursing notes.  Pertinent labs & imaging results that were available during my care of the patient were reviewed by me and considered in my medical decision making (see chart for details).  Clinical Course as of Jan 16 1224  Sat Nov 28, 143  3043 78 year old male brought in for evaluation of itching and bug bites.  He is got multiple live bedbugs in his clothing.  We [place] treat symptomatically with some antihistamines.  Recommended that he get an exterminator to the house and clean all the clothing and bed linens   [MB]    Clinical Course User Index [MB] Hayden Rasmussen, MD       Pt with live bugs found in his clothing.  Several areas of excoriation to the bilateral axilla and antecubital fossa.  No papules, vesicles or welts seen on exam. He has been using a prescription cream from his PCP which is likely permethrin cream. I have explained to the patient that he [place] likely need to have his house exterminated and that he [place] need to thoroughly clean all clothing and bed linens and clean between the mattress and box springs of his bed.  Patient verbalizes understanding of this.    Final Clinical Impressions(s) / ED Diagnoses   Final diagnoses:  Infestation by bed bug    ED Discharge Orders    None       Kem Parkinson, PA-C 01/16/19 1438    Hayden Rasmussen, MD 01/16/19 1712

## 2019-01-16 NOTE — Discharge Instructions (Addendum)
You Alan [name] need to contact an exterminator to come to your home to spray for infestation of bedbugs.  All clothing, bed linens, towels Alan [name] need to be thoroughly cleaned.  You may apply over-the-counter calamine lotion, 1% hydrocortisone cream as directed if needed for itching.  You may also take over-the-counter Benadryl 1 capsule every 4-6 hours as needed for itching.  This may cause drowsiness.  Follow-up with your primary provider for recheck.

## 2019-02-08 ENCOUNTER — Other Ambulatory Visit: Payer: Self-pay | Admitting: Emergency Medicine

## 2019-02-08 DIAGNOSIS — R404 Transient alteration of awareness: Secondary | ICD-10-CM

## 2019-02-18 ENCOUNTER — Other Ambulatory Visit (HOSPITAL_COMMUNITY): Payer: Self-pay | Admitting: Emergency Medicine

## 2019-02-18 DIAGNOSIS — R404 Transient alteration of awareness: Secondary | ICD-10-CM

## 2019-02-23 ENCOUNTER — Ambulatory Visit: Admission: RE | Admit: 2019-02-23 | Payer: Medicare Other | Source: Ambulatory Visit

## 2019-02-25 ENCOUNTER — Encounter (HOSPITAL_COMMUNITY): Payer: Self-pay

## 2019-02-25 ENCOUNTER — Ambulatory Visit (HOSPITAL_COMMUNITY): Admission: RE | Admit: 2019-02-25 | Payer: Medicare Other | Source: Ambulatory Visit

## 2019-02-26 ENCOUNTER — Other Ambulatory Visit (HOSPITAL_COMMUNITY)
Admission: RE | Admit: 2019-02-26 | Discharge: 2019-02-26 | Disposition: A | Discharge status: Home / Self Care | Payer: Medicare Other | Attending: Medical | Admitting: Medical

## 2019-02-26 ENCOUNTER — Emergency Department (HOSPITAL_COMMUNITY)
Admission: EM | Admit: 2019-02-26 | Discharge: 2019-02-26 | Disposition: A | Discharge status: Home / Self Care | Payer: Medicare Other | Attending: Emergency Medicine | Admitting: Emergency Medicine

## 2019-02-26 ENCOUNTER — Encounter (HOSPITAL_COMMUNITY): Payer: Self-pay | Admitting: Emergency Medicine

## 2019-02-26 ENCOUNTER — Emergency Department (HOSPITAL_COMMUNITY): Payer: Medicare Other

## 2019-02-26 ENCOUNTER — Other Ambulatory Visit: Payer: Self-pay

## 2019-02-26 DIAGNOSIS — Z7982 Long term (current) use of aspirin: Secondary | ICD-10-CM | POA: Insufficient documentation

## 2019-02-26 DIAGNOSIS — Z794 Long term (current) use of insulin: Secondary | ICD-10-CM | POA: Diagnosis not present

## 2019-02-26 DIAGNOSIS — J45909 Unspecified asthma, uncomplicated: Secondary | ICD-10-CM | POA: Diagnosis not present

## 2019-02-26 DIAGNOSIS — Y999 Unspecified external cause status: Secondary | ICD-10-CM | POA: Insufficient documentation

## 2019-02-26 DIAGNOSIS — I2510 Atherosclerotic heart disease of native coronary artery without angina pectoris: Secondary | ICD-10-CM | POA: Diagnosis not present

## 2019-02-26 DIAGNOSIS — Y939 Activity, unspecified: Secondary | ICD-10-CM | POA: Diagnosis not present

## 2019-02-26 DIAGNOSIS — W19XXXA Unspecified fall, initial encounter: Secondary | ICD-10-CM

## 2019-02-26 DIAGNOSIS — Z951 Presence of aortocoronary bypass graft: Secondary | ICD-10-CM | POA: Insufficient documentation

## 2019-02-26 DIAGNOSIS — Z992 Dependence on renal dialysis: Secondary | ICD-10-CM | POA: Insufficient documentation

## 2019-02-26 DIAGNOSIS — M545 Low back pain, unspecified: Secondary | ICD-10-CM

## 2019-02-26 DIAGNOSIS — I120 Hypertensive chronic kidney disease with stage 5 chronic kidney disease or end stage renal disease: Secondary | ICD-10-CM | POA: Insufficient documentation

## 2019-02-26 DIAGNOSIS — D649 Anemia, unspecified: Secondary | ICD-10-CM | POA: Insufficient documentation

## 2019-02-26 DIAGNOSIS — N186 End stage renal disease: Secondary | ICD-10-CM | POA: Insufficient documentation

## 2019-02-26 DIAGNOSIS — Y929 Unspecified place or not applicable: Secondary | ICD-10-CM | POA: Diagnosis not present

## 2019-02-26 DIAGNOSIS — E1122 Type 2 diabetes mellitus with diabetic chronic kidney disease: Secondary | ICD-10-CM | POA: Diagnosis not present

## 2019-02-26 DIAGNOSIS — W06XXXA Fall from bed, initial encounter: Secondary | ICD-10-CM | POA: Diagnosis not present

## 2019-02-26 DIAGNOSIS — Z79899 Other long term (current) drug therapy: Secondary | ICD-10-CM | POA: Diagnosis not present

## 2019-02-26 LAB — HEMOGLOBIN AND HEMATOCRIT, BLOOD
HCT: 28.2 % — ABNORMAL LOW (ref 39.0–52.0)
Hemoglobin: 9.3 g/dL — ABNORMAL LOW (ref 13.0–17.0)

## 2019-02-26 MED ORDER — HYDROCODONE-ACETAMINOPHEN 5-325 MG PO TABS
1.0000 | ORAL_TABLET | Freq: Once | ORAL | Status: AC
  Administered 2019-02-26: 1 via ORAL
  Filled 2019-02-26: qty 1

## 2019-02-26 MED ORDER — HYDROCODONE-ACETAMINOPHEN 5-325 MG PO TABS: 1.0000 | ORAL_TABLET | Freq: Three times a day (TID) | ORAL | 0 refills | Status: AC | PRN

## 2019-02-26 NOTE — ED Provider Notes (Signed)
[facility] EMERGENCY DEPARTMENT Provider Note   CSN: [id number] Arrival date & time: 02/26/19  1317     History Chief Complaint  Patient presents with  . Fall    Alan [name] is a 79 y.o. male.  Patient is a 79 year old gentleman with past medical history of diabetes, CKD on dialysis, bilateral AKA presenting to the emergency department for left side pain after fall.  Patient reports that a few days ago he accidentally rolled out of bed onto his left side.  Reports that he is having left lower back pain since then.  The pain is worse only with movement.  It is better at rest.  Denies any numbness, tingling, weakness, saddle anesthesia.  Does not think he hit his head or passed out and does not have any head or neck pain.        Past Medical History:  Diagnosis Date  . Arthritis   . Asthma   . Clotted renal dialysis arteriovenous graft ([place])   . Coronary artery disease    a. s/p CABG in 1996 with LIMA-LAD and SVG-OM1 b. 01/2017: cath showing occluded SVG-OM1 and atretic LIMA-LAD. Diffuse disease along 1st Mrg and PDA with medical therapy recommended.   . CVA (cerebral infarction)   . DDD (degenerative disc disease), cervical   . DDD (degenerative disc disease), lumbar   . Depression   . Diabetes mellitus   . 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 Gangrene of toe ([place]) Feb. 2015   Right  great    . Gastric ulcer    EGD 2014  . Gout   . Hypercholesteremia   . Hypertension   . Sternal wound dehiscence    chronic  . Stroke ([place])   . 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 Thrombosis of renal dialysis arteriovenous graft [facility])     Patient Active Problem List   Diagnosis Date Noted  . Angina at rest [facility]) 01/23/2018  . Hemodialysis-associated hypotension   . Left sided numbness 02/10/2017  . Hypertensive urgency 02/10/2017  . Hypokalemia 02/10/2017  . Chest pain 06/15/2016  . Renal dialysis device, implant, or graft complication [id number]  . Septic  shock ([place]) 08/28/2015  . 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 Lower urinary tract infectious disease   . Essential hypertension 08/13/2015  . Nausea and vomiting 08/13/2015  . ESRD (end stage renal disease) ([place]) 04/12/2015  . Gastroenteritis 04/12/2015  . S/P bilateral BKA (below knee amputation) ([place]) 04/12/2015  . HLD (hyperlipidemia) 04/12/2015  . Acute chest pain 11/16/2014  . Atherosclerosis of native arteries of the extremities with gangrene ([place]) 04/20/2013  . Sepsis ([place]) 04/13/2013  . Gram-negative bacteremia 04/12/2013  . ESRD on dialysis ([place]) 04/09/2013  . 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 Protein-calorie malnutrition, severe ([place]) 04/09/2013  . Gangrene of toe ([place]) 04/08/2013  . Toe gangrene ([place]) 04/08/2013  . Nausea with vomiting 05/22/2012  . S/P BKA (below knee amputation) unilateral, left 05/20/2012  . Musculoskeletal chest pain 05/20/2012  . Acute renal failure ([place]) 05/19/2012  . CAD (coronary artery disease) with CABG 05/19/2012  . DM type 2 causing vascular disease ([place]) 05/19/2012  . Hyperkalemia 05/19/2012    Past Surgical History:  Procedure Laterality Date  . A/V FISTULAGRAM Left 05/28/2016   Procedure: A/V Fistulagram;  Surgeon: Katha Cabal, MD;  Location: [place] CV LAB;  Service: Cardiovascular;  Laterality: Left;  . A/V FISTULAGRAM Left 10/28/2016   Procedure: A/V Fistulagram;  Surgeon: Algernon Huxley, MD;  Location: [place] [facility];  Service: Cardiovascular;  Laterality: Left;  . A/V FISTULAGRAM Left 03/03/2017   Procedure: A/V FISTULAGRAM;  Surgeon: Algernon Huxley, MD;  Location: [place] [facility];  Service: Cardiovascular;  Laterality: Left;  . A/V SHUNT INTERVENTION N/A 05/28/2016   Procedure: A/V Shunt Intervention;  Surgeon: Katha Cabal, MD;  Location: [place] [facility] LAB;  Service: Cardiovascular;  Laterality: N/A;  . A/V SHUNT INTERVENTION N/A 03/03/2017   Procedure: A/V SHUNT INTERVENTION;  Surgeon: Algernon Huxley, MD;  Location: [place] [facility];  Service: Cardiovascular;  Laterality: N/A;  . AMPUTATION Right 04/10/2013   Procedure: Right Below Knee Amputation;  Surgeon: Newt Minion, MD;  Location: [place];  Service: Orthopedics;  Laterality: Right;  Right Below Knee Amputation  . APPENDECTOMY    . BELOW KNEE LEG AMPUTATION Bilateral   . CHOLECYSTECTOMY    . CORONARY ARTERY BYPASS GRAFT  1996   [place]  . ESOPHAGOGASTRODUODENOSCOPY Left 05/24/2012   [id number] dilated baggy but otherwise a normal/ Small hiatal hernia. Gastric ulcer -S/P biopsy  . LEFT HEART CATH AND CORS/GRAFTS ANGIOGRAPHY N/A 02/12/2017   Procedure: LEFT HEART CATH AND CORS/GRAFTS ANGIOGRAPHY;  Surgeon: Lorretta Harp, MD;  Location: [place] [facility];  Service: Cardiovascular;  Laterality: N/A;  . PERIPHERAL VASCULAR CATHETERIZATION N/A 08/09/2014   Procedure: A/V Shuntogram/Fistulagram;  Surgeon: Katha Cabal, MD;  Location: [place] [facility] LAB;  Service: Cardiovascular;  Laterality: N/A;  . PERIPHERAL VASCULAR CATHETERIZATION Left 08/09/2014   Procedure: A/V Shunt Intervention;  Surgeon: Katha Cabal, MD;  Location: [place] [facility];  Service: Cardiovascular;  Laterality: Left;       Family History  Problem Relation Age of Onset  . Cancer Mother   . Colon cancer Neg Hx     Social History   Tobacco Use  . Smoking status: Former Smoker    Quit date: 08/08/2004    Years since quitting: 14.5  . Smokeless tobacco: Never Used  Substance Use Topics  . Alcohol use: No  . Drug use: No    Home Medications Prior to Admission medications   Medication Sig Start Date End Date Taking? Authorizing Provider  acetaminophen (TYLENOL) 500 MG tablet Take 1,000 mg by mouth at bedtime. *May take daily if needed but takes mostly at bedtime    [provider]  Amino Acid Infusion (PROSOL) 20 % SOLN Inject 1 vial into the vein once a week. 02/28/17   [provider]  aspirin EC 81 MG tablet Take  81 mg by mouth daily.     [provider]  atorvastatin (LIPITOR) 20 MG tablet Take 20 mg by mouth at bedtime.  01/02/18   [provider]  B Complex-C-Folic Acid (NEPHRO-VITE PO) Take 1 tablet by mouth at bedtime.     [provider]  budesonide-formoterol (SYMBICORT) 80-4.5 MCG/ACT inhaler Inhale 2 puffs into the lungs 2 (two) times daily as needed (for wheezing/shortness of breath.).    [provider]  cinacalcet (SENSIPAR) 30 MG tablet Take 30 mg by mouth at bedtime.     [provider]  Coenzyme Q10 (COQ10) 100 MG CAPS Take 100 mg by mouth at bedtime.    [provider]  diphenhydrAMINE (BENADRYL) 25 mg capsule Take 25 mg by mouth at bedtime.     [provider]  furosemide (LASIX) 20 MG tablet Take 20 mg by mouth daily.    [provider]  HYDROcodone-acetaminophen (NORCO/VICODIN) 5-325  MG tablet Take 1 tablet by mouth 3 (three) times daily as needed for up to 2 days for severe pain. 02/26/19 02/28/19  Alveria Apley, PA-C  isosorbide mononitrate (IMDUR) 60 MG 24 hr tablet Take 1 tablet (60 mg total) by mouth 2 (two) times daily. 01/05/19 04/05/19  [name], MD  LANTUS SOLOSTAR 100 UNIT/ML Solostar Pen Inject 9 Units into the skin at bedtime.  12/21/13   [provider]  Liniments (SALONPAS PAIN RELIEF PATCH EX) Place 1 patch onto the skin daily as needed (for pain relief.).     [provider]  lisinopril (PRINIVIL,ZESTRIL) 5 MG tablet Take 5 mg by mouth daily.    [provider]  nitroGLYCERIN (NITROSTAT) 0.4 MG SL tablet Place 1 tablet (0.4 mg total) under the tongue every 5 (five) minutes as needed for chest pain. 11/18/14   Samuella Cota, MD  omega-3 acid ethyl esters (LOVAZA) 1 g capsule Take 1 g by mouth daily.    [provider]  omeprazole (PRILOSEC) 20 MG capsule Take 1 capsule by mouth 2 (two) times a day.  03/20/17   [provider]  sevelamer carbonate  (RENVELA) 800 MG tablet Take 800 mg by mouth 3 (three) times daily with meals.  01/07/18   [provider]    Allergies    Codeine and Yellow jacket venom [bee venom]  Review of Systems   Review of Systems  Constitutional: Negative for appetite change, chills and fever.  Eyes: Negative for visual disturbance.  Gastrointestinal: Negative for abdominal pain, diarrhea, nausea and rectal pain.  Musculoskeletal: Positive for arthralgias and back pain. Negative for joint swelling, neck pain and neck stiffness.  Skin: Negative for rash and wound.  Neurological: Negative for dizziness, tremors, seizures, syncope, weakness, light-headedness, numbness and headaches.    Physical Exam Updated Vital Signs BP (!) 110/46   Pulse 70   Temp 97.8 F (36.6 C) (Oral)   Resp 18   Ht 5\' 8"  (1.727 m)   Wt 62 kg   SpO2 100%   BMI 20.78 kg/m   Physical Exam Constitutional:      General: He is not in acute distress.    Appearance: He is not toxic-appearing or diaphoretic.     Comments: Chronically ill appearing, NAD  HENT:     Head: Normocephalic and atraumatic.     Mouth/Throat:     Mouth: Mucous membranes are moist.  Eyes:     Pupils: Pupils are equal, round, and reactive to light.  Cardiovascular:     Rate and Rhythm: Normal rate.  Pulmonary:     Effort: Pulmonary effort is normal.     Breath sounds: Normal breath sounds.  Abdominal:     General: Abdomen is flat.  Musculoskeletal:     Cervical back: Full passive range of motion without pain. No pain with movement, spinous process tenderness or muscular tenderness.     Comments: No signs of trauma. FROM of hips without pain. TTP in the mid and lower thoracic and lumbar spine with scoliosis present. FROM of the L elbow but with diffuse TTP, no signs of trauma or injury. Non tender L shoulder and wrist/hand  Skin:    General: Skin is warm.     Capillary Refill: Capillary refill takes less than 2 seconds.     Findings: No bruising,  erythema or rash.  Neurological:     General: No focal deficit present.     Mental Status: He is alert  and oriented to person, place, and time.  Psychiatric:        Mood and Affect: Mood normal.     ED Results / Procedures / Treatments   Labs (all labs ordered are listed, but only abnormal results are displayed) Labs Reviewed - No data to display  EKG None  Radiology DG Thoracic Spine 2 View  Result Date: 02/26/2019 CLINICAL DATA:  Fall. EXAM: THORACIC SPINE 2 VIEWS COMPARISON:  Chest 05/04/2018.  CT 01/09/2018. FINDINGS: Severe thoracic spine scoliosis and degenerative change present. This makes evaluation for fracture difficult. Mild stable lower thoracic vertebral body compression fractures appear to be present. No acute abnormality identified. If symptoms persist MRI can be obtained. Surgical clips noted in the upper abdomen. IMPRESSION: Severe thoracic spine scoliosis and degenerative change make evaluation for fracture difficult. Mild stable lower thoracic vertebral body compression fractures appear to be present. No acute abnormality identified. Electronically Signed   By: Marcello Moores  Register   On: 02/26/2019 15:13   DG Lumbar Spine Complete  Result Date: 02/26/2019 CLINICAL DATA:  Fall EXAM: LUMBAR SPINE - COMPLETE 4+ VIEW COMPARISON:  None. FINDINGS: Diffusely decreased osseous mineralization and air/stool throughout the colon somewhat limits osseous evaluation. Levoscoliosis. There is multilevel degenerative disc disease with disc space narrowing and endplate osteophytes. Multilevel facet hypertrophy is present. No definite acute fracture. IMPRESSION: No definite acute fracture. Advanced multilevel degenerative changes superimposed on scoliosis. Electronically Signed   By: Macy Mis M.D.   On: 02/26/2019 15:22   DG Elbow Complete Left  Result Date: 02/26/2019 CLINICAL DATA:  Left elbow pain since a fall 02/23/2018. Initial encounter. EXAM: LEFT ELBOW - COMPLETE 3+ VIEW  COMPARISON:  None. FINDINGS: There is no evidence of fracture, dislocation, or joint effusion. There is no evidence of arthropathy or other focal bone abnormality. Mild spurring at the triceps tendon insertion is incidentally noted. Soft tissues are unremarkable. IMPRESSION: Negative exam. Electronically Signed   By: Inge Rise M.D.   On: 02/26/2019 15:11    Procedures Procedures (including critical care time)  Medications Ordered in ED Medications  HYDROcodone-acetaminophen (NORCO/VICODIN) 5-325 MG per tablet 1 tablet (1 tablet Oral Given 02/26/19 1356)    ED Course  I have reviewed the triage vital signs and the nursing notes.  Pertinent labs & imaging results that were available during my care of the patient were reviewed by me and considered in my medical decision making (see chart for details).    MDM Rules/Calculators/A&P                      Based on review of vitals, medical screening exam, lab work and/or imaging, there does not appear to be an acute, emergent etiology for the patient's symptoms. Counseled pt on good return precautions and encouraged both PCP and ED follow-up as needed.  Prior to discharge, I also discussed incidental imaging findings with patient in detail and advised appropriate, recommended follow-up in detail.  Clinical Impression: 1. Fall, initial encounter   2. Acute left-sided low back pain without sciatica     Disposition: Discharge  Prior to providing a prescription for a controlled substance, I independently reviewed the patient's recent prescription history on the [place]. The patient had no recent or regular prescriptions and was deemed appropriate for a brief, less than 3 day prescription of narcotic for acute analgesia.  This note was prepared with assistance of Systems analyst. Occasional wrong-word or sound-a-like substitutions may have  occurred due to the inherent limitations  of voice recognition software.  Final Clinical Impression(s) / ED Diagnoses Final diagnoses:  Fall, initial encounter  Acute left-sided low back pain without sciatica    Rx / DC Orders ED Discharge Orders         Ordered    HYDROcodone-acetaminophen (NORCO/VICODIN) 5-325 MG tablet  3 times daily PRN     02/26/19 1537           Kristine Royal 02/26/19 1537    Lajean Saver, MD 02/28/19 [phone]

## 2019-02-26 NOTE — Discharge Instructions (Addendum)
You were seen today for left side pain after a fall. Your xrays did not show any signs of break, fracture or dislocation. You do have lots of arthritis in your spine. We have given you pain medications to treat your pain.Thank you for allowing me to care for you today. Please return to the emergency department if you have new or worsening symptoms. Take your medications as instructed.

## 2019-02-26 NOTE — ED Triage Notes (Signed)
Patient fell on Wed. C/O L sided lower back pain.

## 2019-02-26 NOTE — Progress Notes (Signed)
    NEPHROLOGY NURSING NOTE:  Confirmed with Seth Bake, charge nurse at [facility] that Mr. Henkels completed 2.5 of his 4 hour HD treatment today.  HD was terminated early due to c/o pain from his recent fall.  No heparin with HD today.  Rockwell Alexandria, RN

## 2019-02-26 NOTE — ED Notes (Signed)
Patient's wife and son here to pick patient up.  ED signature pad would not work for son to be able to sign.

## 2019-03-04 ENCOUNTER — Ambulatory Visit (HOSPITAL_COMMUNITY): Payer: Medicare Other

## 2019-03-22 DEATH — deceased
